# Patient Record
Sex: Female | Born: 1965 | Race: Black or African American | Hispanic: No | Marital: Married | Smoking: Former smoker
Health system: Southern US, Community
[De-identification: ages and names within clinical notes are randomized; demographics above are authoritative.]

## PROBLEM LIST (undated history)

## (undated) DIAGNOSIS — K209 Esophagitis, unspecified: Secondary | ICD-10-CM

## (undated) DIAGNOSIS — I10 Essential (primary) hypertension: Secondary | ICD-10-CM

## (undated) DIAGNOSIS — Z227 Latent tuberculosis: Secondary | ICD-10-CM

## (undated) DIAGNOSIS — I2510 Atherosclerotic heart disease of native coronary artery without angina pectoris: Secondary | ICD-10-CM

## (undated) DIAGNOSIS — E119 Type 2 diabetes mellitus without complications: Secondary | ICD-10-CM

## (undated) DIAGNOSIS — F1910 Other psychoactive substance abuse, uncomplicated: Secondary | ICD-10-CM

## (undated) DIAGNOSIS — D509 Iron deficiency anemia, unspecified: Secondary | ICD-10-CM

## (undated) DIAGNOSIS — Z930 Tracheostomy status: Secondary | ICD-10-CM

## (undated) DIAGNOSIS — Z8639 Personal history of other endocrine, nutritional and metabolic disease: Secondary | ICD-10-CM

## (undated) DIAGNOSIS — E785 Hyperlipidemia, unspecified: Secondary | ICD-10-CM

## (undated) DIAGNOSIS — Z8619 Personal history of other infectious and parasitic diseases: Secondary | ICD-10-CM

## (undated) DIAGNOSIS — G9340 Encephalopathy, unspecified: Secondary | ICD-10-CM

## (undated) DIAGNOSIS — E1065 Type 1 diabetes mellitus with hyperglycemia: Principal | ICD-10-CM

## (undated) DIAGNOSIS — F99 Mental disorder, not otherwise specified: Secondary | ICD-10-CM

## (undated) HISTORY — DX: Encephalopathy, unspecified: G93.40

## (undated) HISTORY — DX: Esophagitis, unspecified: K20.9

## (undated) HISTORY — DX: Latent tuberculosis: Z22.7

## (undated) HISTORY — DX: Type 1 diabetes mellitus with hyperglycemia: E10.65

## (undated) HISTORY — DX: Mental disorder, not otherwise specified: F99

## (undated) HISTORY — DX: Tracheostomy status: Z93.0

## (undated) HISTORY — DX: Personal history of other endocrine, nutritional and metabolic disease: Z86.39

## (undated) HISTORY — DX: Iron deficiency anemia, unspecified: D50.9

## (undated) HISTORY — DX: Other psychoactive substance abuse, uncomplicated: F19.10

## (undated) HISTORY — DX: Hyperlipidemia, unspecified: E78.5

## (undated) HISTORY — PX: TRACHEOSTOMY: SUR1362

## (undated) HISTORY — PX: APPENDECTOMY: SHX54

## (undated) HISTORY — PX: EYE SURGERY: SHX253

## (undated) HISTORY — DX: Personal history of other infectious and parasitic diseases: Z86.19

## (undated) HISTORY — DX: Essential (primary) hypertension: I10

## (undated) HISTORY — DX: Atherosclerotic heart disease of native coronary artery without angina pectoris: I25.10

---
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
## 1998-01-08 ENCOUNTER — Emergency Department (HOSPITAL_COMMUNITY): Admission: EM | Admit: 1998-01-08 | Discharge: 1998-01-08 | Payer: Self-pay | Admitting: *Deleted

## 1998-01-15 ENCOUNTER — Emergency Department (HOSPITAL_COMMUNITY): Admission: EM | Admit: 1998-01-15 | Discharge: 1998-01-15 | Payer: Self-pay | Admitting: Emergency Medicine

## 1998-02-11 ENCOUNTER — Emergency Department (HOSPITAL_COMMUNITY): Admission: EM | Admit: 1998-02-11 | Discharge: 1998-02-11 | Payer: Self-pay | Admitting: *Deleted

## 1998-03-24 ENCOUNTER — Emergency Department (HOSPITAL_COMMUNITY): Admission: EM | Admit: 1998-03-24 | Discharge: 1998-03-24 | Payer: Self-pay | Admitting: Emergency Medicine

## 1998-05-27 ENCOUNTER — Emergency Department (HOSPITAL_COMMUNITY): Admission: EM | Admit: 1998-05-27 | Discharge: 1998-05-27 | Payer: Self-pay | Admitting: Emergency Medicine

## 1998-05-28 ENCOUNTER — Emergency Department (HOSPITAL_COMMUNITY): Admission: EM | Admit: 1998-05-28 | Discharge: 1998-05-28 | Payer: Self-pay | Admitting: *Deleted

## 1998-05-29 ENCOUNTER — Emergency Department (HOSPITAL_COMMUNITY): Admission: EM | Admit: 1998-05-29 | Discharge: 1998-05-29 | Payer: Self-pay | Admitting: Emergency Medicine

## 1998-06-04 ENCOUNTER — Emergency Department (HOSPITAL_COMMUNITY): Admission: EM | Admit: 1998-06-04 | Discharge: 1998-06-04 | Payer: Self-pay | Admitting: Emergency Medicine

## 1998-06-12 ENCOUNTER — Emergency Department (HOSPITAL_COMMUNITY): Admission: EM | Admit: 1998-06-12 | Discharge: 1998-06-12 | Payer: Self-pay | Admitting: Emergency Medicine

## 1998-07-31 ENCOUNTER — Emergency Department (HOSPITAL_COMMUNITY): Admission: EM | Admit: 1998-07-31 | Discharge: 1998-07-31 | Payer: Self-pay | Admitting: Emergency Medicine

## 1998-09-21 ENCOUNTER — Encounter: Payer: Self-pay | Admitting: Emergency Medicine

## 1998-09-21 ENCOUNTER — Emergency Department (HOSPITAL_COMMUNITY): Admission: EM | Admit: 1998-09-21 | Discharge: 1998-09-21 | Payer: Self-pay | Admitting: Emergency Medicine

## 1998-12-25 ENCOUNTER — Encounter: Payer: Self-pay | Admitting: Emergency Medicine

## 1998-12-25 ENCOUNTER — Inpatient Hospital Stay (HOSPITAL_COMMUNITY): Admission: EM | Admit: 1998-12-25 | Discharge: 1998-12-28 | Payer: Self-pay | Admitting: Emergency Medicine

## 1999-04-18 ENCOUNTER — Emergency Department (HOSPITAL_COMMUNITY): Admission: EM | Admit: 1999-04-18 | Discharge: 1999-04-18 | Payer: Self-pay | Admitting: Emergency Medicine

## 1999-04-22 ENCOUNTER — Emergency Department (HOSPITAL_COMMUNITY): Admission: EM | Admit: 1999-04-22 | Discharge: 1999-04-22 | Payer: Self-pay | Admitting: Emergency Medicine

## 1999-04-23 ENCOUNTER — Encounter: Payer: Self-pay | Admitting: Emergency Medicine

## 1999-06-09 ENCOUNTER — Encounter: Payer: Self-pay | Admitting: Internal Medicine

## 1999-06-09 ENCOUNTER — Inpatient Hospital Stay (HOSPITAL_COMMUNITY): Admission: EM | Admit: 1999-06-09 | Discharge: 1999-06-11 | Payer: Self-pay | Admitting: Emergency Medicine

## 1999-06-11 ENCOUNTER — Encounter: Payer: Self-pay | Admitting: Internal Medicine

## 1999-09-06 ENCOUNTER — Encounter: Payer: Self-pay | Admitting: Emergency Medicine

## 1999-09-06 ENCOUNTER — Emergency Department (HOSPITAL_COMMUNITY): Admission: EM | Admit: 1999-09-06 | Discharge: 1999-09-06 | Payer: Self-pay | Admitting: Emergency Medicine

## 1999-09-13 ENCOUNTER — Emergency Department (HOSPITAL_COMMUNITY): Admission: EM | Admit: 1999-09-13 | Discharge: 1999-09-13 | Payer: Self-pay | Admitting: Emergency Medicine

## 1999-10-26 ENCOUNTER — Emergency Department (HOSPITAL_COMMUNITY): Admission: EM | Admit: 1999-10-26 | Discharge: 1999-10-26 | Payer: Self-pay | Admitting: Emergency Medicine

## 2000-09-23 ENCOUNTER — Emergency Department (HOSPITAL_COMMUNITY): Admission: EM | Admit: 2000-09-23 | Discharge: 2000-09-23 | Payer: Self-pay | Admitting: Emergency Medicine

## 2000-09-26 ENCOUNTER — Encounter: Payer: Self-pay | Admitting: Emergency Medicine

## 2000-09-27 ENCOUNTER — Inpatient Hospital Stay (HOSPITAL_COMMUNITY): Admission: EM | Admit: 2000-09-27 | Discharge: 2000-09-28 | Payer: Self-pay | Admitting: Emergency Medicine

## 2000-12-24 ENCOUNTER — Encounter: Payer: Self-pay | Admitting: *Deleted

## 2000-12-24 ENCOUNTER — Inpatient Hospital Stay (HOSPITAL_COMMUNITY): Admission: EM | Admit: 2000-12-24 | Discharge: 2000-12-27 | Payer: Self-pay | Admitting: Emergency Medicine

## 2000-12-24 ENCOUNTER — Encounter: Payer: Self-pay | Admitting: Internal Medicine

## 2001-01-12 ENCOUNTER — Encounter: Payer: Self-pay | Admitting: Emergency Medicine

## 2001-01-12 ENCOUNTER — Emergency Department (HOSPITAL_COMMUNITY): Admission: EM | Admit: 2001-01-12 | Discharge: 2001-01-12 | Payer: Self-pay | Admitting: Emergency Medicine

## 2001-01-13 ENCOUNTER — Emergency Department (HOSPITAL_COMMUNITY): Admission: EM | Admit: 2001-01-13 | Discharge: 2001-01-13 | Payer: Self-pay | Admitting: Emergency Medicine

## 2001-02-08 ENCOUNTER — Encounter: Payer: Self-pay | Admitting: Emergency Medicine

## 2001-02-08 ENCOUNTER — Emergency Department (HOSPITAL_COMMUNITY): Admission: EM | Admit: 2001-02-08 | Discharge: 2001-02-08 | Payer: Self-pay | Admitting: Emergency Medicine

## 2001-11-22 ENCOUNTER — Emergency Department (HOSPITAL_COMMUNITY): Admission: EM | Admit: 2001-11-22 | Discharge: 2001-11-23 | Payer: Self-pay | Admitting: Emergency Medicine

## 2001-12-18 ENCOUNTER — Inpatient Hospital Stay (HOSPITAL_COMMUNITY): Admission: EM | Admit: 2001-12-18 | Discharge: 2001-12-20 | Payer: Self-pay | Admitting: Emergency Medicine

## 2001-12-18 ENCOUNTER — Encounter: Payer: Self-pay | Admitting: Internal Medicine

## 2002-01-04 ENCOUNTER — Inpatient Hospital Stay (HOSPITAL_COMMUNITY): Admission: EM | Admit: 2002-01-04 | Discharge: 2002-01-11 | Payer: Self-pay | Admitting: Psychiatry

## 2002-01-17 ENCOUNTER — Inpatient Hospital Stay (HOSPITAL_COMMUNITY): Admission: EM | Admit: 2002-01-17 | Discharge: 2002-01-20 | Payer: Self-pay | Admitting: Emergency Medicine

## 2002-01-20 ENCOUNTER — Encounter: Payer: Self-pay | Admitting: Family Medicine

## 2002-02-07 ENCOUNTER — Inpatient Hospital Stay (HOSPITAL_COMMUNITY): Admission: EM | Admit: 2002-02-07 | Discharge: 2002-02-11 | Payer: Self-pay | Admitting: *Deleted

## 2002-02-08 ENCOUNTER — Encounter: Payer: Self-pay | Admitting: Internal Medicine

## 2002-03-08 ENCOUNTER — Emergency Department (HOSPITAL_COMMUNITY): Admission: EM | Admit: 2002-03-08 | Discharge: 2002-03-08 | Payer: Self-pay | Admitting: Emergency Medicine

## 2002-04-10 ENCOUNTER — Encounter: Payer: Self-pay | Admitting: Emergency Medicine

## 2002-04-10 ENCOUNTER — Inpatient Hospital Stay (HOSPITAL_COMMUNITY): Admission: EM | Admit: 2002-04-10 | Discharge: 2002-04-13 | Payer: Self-pay | Admitting: Emergency Medicine

## 2002-04-11 ENCOUNTER — Encounter: Payer: Self-pay | Admitting: Internal Medicine

## 2002-05-18 ENCOUNTER — Emergency Department (HOSPITAL_COMMUNITY): Admission: EM | Admit: 2002-05-18 | Discharge: 2002-05-18 | Payer: Self-pay | Admitting: Emergency Medicine

## 2002-06-24 ENCOUNTER — Encounter: Payer: Self-pay | Admitting: Emergency Medicine

## 2002-06-25 ENCOUNTER — Inpatient Hospital Stay (HOSPITAL_COMMUNITY): Admission: EM | Admit: 2002-06-25 | Discharge: 2002-06-30 | Payer: Self-pay | Admitting: Emergency Medicine

## 2002-06-30 ENCOUNTER — Inpatient Hospital Stay (HOSPITAL_COMMUNITY): Admission: EM | Admit: 2002-06-30 | Discharge: 2002-07-03 | Payer: Self-pay | Admitting: Psychiatry

## 2002-07-05 ENCOUNTER — Inpatient Hospital Stay (HOSPITAL_COMMUNITY): Admission: EM | Admit: 2002-07-05 | Discharge: 2002-07-08 | Payer: Self-pay | Admitting: Emergency Medicine

## 2002-08-31 ENCOUNTER — Inpatient Hospital Stay (HOSPITAL_COMMUNITY): Admission: EM | Admit: 2002-08-31 | Discharge: 2002-09-05 | Payer: Self-pay | Admitting: Emergency Medicine

## 2002-08-31 ENCOUNTER — Encounter: Payer: Self-pay | Admitting: Emergency Medicine

## 2002-08-31 ENCOUNTER — Encounter: Payer: Self-pay | Admitting: Internal Medicine

## 2002-09-01 ENCOUNTER — Encounter: Payer: Self-pay | Admitting: Internal Medicine

## 2002-09-15 ENCOUNTER — Emergency Department (HOSPITAL_COMMUNITY): Admission: EM | Admit: 2002-09-15 | Discharge: 2002-09-15 | Payer: Self-pay | Admitting: Emergency Medicine

## 2002-09-16 ENCOUNTER — Emergency Department (HOSPITAL_COMMUNITY): Admission: EM | Admit: 2002-09-16 | Discharge: 2002-09-16 | Payer: Self-pay

## 2002-09-27 ENCOUNTER — Emergency Department (HOSPITAL_COMMUNITY): Admission: EM | Admit: 2002-09-27 | Discharge: 2002-09-27 | Payer: Self-pay | Admitting: Emergency Medicine

## 2002-10-02 ENCOUNTER — Emergency Department (HOSPITAL_COMMUNITY): Admission: EM | Admit: 2002-10-02 | Discharge: 2002-10-02 | Payer: Self-pay | Admitting: Emergency Medicine

## 2002-10-03 ENCOUNTER — Emergency Department (HOSPITAL_COMMUNITY): Admission: EM | Admit: 2002-10-03 | Discharge: 2002-10-03 | Payer: Self-pay | Admitting: Emergency Medicine

## 2002-10-15 ENCOUNTER — Inpatient Hospital Stay (HOSPITAL_COMMUNITY): Admission: EM | Admit: 2002-10-15 | Discharge: 2002-10-16 | Payer: Self-pay | Admitting: Emergency Medicine

## 2002-11-17 ENCOUNTER — Emergency Department (HOSPITAL_COMMUNITY): Admission: EM | Admit: 2002-11-17 | Discharge: 2002-11-17 | Payer: Self-pay | Admitting: Emergency Medicine

## 2002-12-12 ENCOUNTER — Inpatient Hospital Stay (HOSPITAL_COMMUNITY): Admission: EM | Admit: 2002-12-12 | Discharge: 2002-12-16 | Payer: Self-pay | Admitting: Emergency Medicine

## 2002-12-30 ENCOUNTER — Emergency Department (HOSPITAL_COMMUNITY): Admission: EM | Admit: 2002-12-30 | Discharge: 2002-12-31 | Payer: Self-pay | Admitting: Emergency Medicine

## 2003-01-09 ENCOUNTER — Emergency Department (HOSPITAL_COMMUNITY): Admission: EM | Admit: 2003-01-09 | Discharge: 2003-01-09 | Payer: Self-pay | Admitting: *Deleted

## 2003-01-17 ENCOUNTER — Emergency Department (HOSPITAL_COMMUNITY): Admission: EM | Admit: 2003-01-17 | Discharge: 2003-01-18 | Payer: Self-pay | Admitting: Emergency Medicine

## 2003-01-19 ENCOUNTER — Inpatient Hospital Stay (HOSPITAL_COMMUNITY): Admission: EM | Admit: 2003-01-19 | Discharge: 2003-01-26 | Payer: Self-pay | Admitting: Psychiatry

## 2003-02-07 ENCOUNTER — Emergency Department (HOSPITAL_COMMUNITY): Admission: EM | Admit: 2003-02-07 | Discharge: 2003-02-07 | Payer: Self-pay | Admitting: Emergency Medicine

## 2003-02-15 ENCOUNTER — Emergency Department (HOSPITAL_COMMUNITY): Admission: EM | Admit: 2003-02-15 | Discharge: 2003-02-15 | Payer: Self-pay

## 2003-03-07 ENCOUNTER — Inpatient Hospital Stay (HOSPITAL_COMMUNITY): Admission: EM | Admit: 2003-03-07 | Discharge: 2003-03-17 | Payer: Self-pay | Admitting: Emergency Medicine

## 2003-03-07 ENCOUNTER — Encounter: Payer: Self-pay | Admitting: Otolaryngology

## 2003-04-02 ENCOUNTER — Inpatient Hospital Stay (HOSPITAL_COMMUNITY): Admission: EM | Admit: 2003-04-02 | Discharge: 2003-04-13 | Payer: Self-pay | Admitting: Emergency Medicine

## 2003-04-03 ENCOUNTER — Encounter: Payer: Self-pay | Admitting: Internal Medicine

## 2003-04-04 ENCOUNTER — Encounter (INDEPENDENT_AMBULATORY_CARE_PROVIDER_SITE_OTHER): Payer: Self-pay

## 2003-04-17 ENCOUNTER — Emergency Department (HOSPITAL_COMMUNITY): Admission: EM | Admit: 2003-04-17 | Discharge: 2003-04-17 | Payer: Self-pay | Admitting: *Deleted

## 2003-04-25 ENCOUNTER — Emergency Department (HOSPITAL_COMMUNITY): Admission: EM | Admit: 2003-04-25 | Discharge: 2003-04-25 | Payer: Self-pay | Admitting: Emergency Medicine

## 2003-07-06 ENCOUNTER — Emergency Department (HOSPITAL_COMMUNITY): Admission: AD | Admit: 2003-07-06 | Discharge: 2003-07-06 | Payer: Self-pay | Admitting: Family Medicine

## 2003-07-07 ENCOUNTER — Emergency Department (HOSPITAL_COMMUNITY): Admission: AD | Admit: 2003-07-07 | Discharge: 2003-07-07 | Payer: Self-pay | Admitting: Family Medicine

## 2003-07-27 ENCOUNTER — Inpatient Hospital Stay (HOSPITAL_COMMUNITY): Admission: EM | Admit: 2003-07-27 | Discharge: 2003-07-30 | Payer: Self-pay | Admitting: Emergency Medicine

## 2003-08-30 ENCOUNTER — Inpatient Hospital Stay (HOSPITAL_COMMUNITY): Admission: EM | Admit: 2003-08-30 | Discharge: 2003-09-02 | Payer: Self-pay | Admitting: Emergency Medicine

## 2003-11-02 ENCOUNTER — Emergency Department (HOSPITAL_COMMUNITY): Admission: EM | Admit: 2003-11-02 | Discharge: 2003-11-02 | Payer: Self-pay | Admitting: Emergency Medicine

## 2003-11-24 ENCOUNTER — Emergency Department (HOSPITAL_COMMUNITY): Admission: EM | Admit: 2003-11-24 | Discharge: 2003-11-24 | Payer: Self-pay | Admitting: Family Medicine

## 2003-12-28 ENCOUNTER — Emergency Department (HOSPITAL_COMMUNITY): Admission: EM | Admit: 2003-12-28 | Discharge: 2003-12-28 | Payer: Self-pay | Admitting: Family Medicine

## 2004-02-10 ENCOUNTER — Emergency Department (HOSPITAL_COMMUNITY): Admission: EM | Admit: 2004-02-10 | Discharge: 2004-02-10 | Payer: Self-pay | Admitting: Emergency Medicine

## 2004-02-12 ENCOUNTER — Emergency Department (HOSPITAL_COMMUNITY): Admission: EM | Admit: 2004-02-12 | Discharge: 2004-02-12 | Payer: Self-pay | Admitting: Family Medicine

## 2004-03-10 ENCOUNTER — Emergency Department (HOSPITAL_COMMUNITY): Admission: EM | Admit: 2004-03-10 | Discharge: 2004-03-10 | Payer: Self-pay | Admitting: Emergency Medicine

## 2004-04-14 ENCOUNTER — Emergency Department (HOSPITAL_COMMUNITY): Admission: EM | Admit: 2004-04-14 | Discharge: 2004-04-14 | Payer: Self-pay | Admitting: Emergency Medicine

## 2004-05-18 ENCOUNTER — Ambulatory Visit: Payer: Self-pay | Admitting: Internal Medicine

## 2004-07-27 ENCOUNTER — Emergency Department (HOSPITAL_COMMUNITY): Admission: EM | Admit: 2004-07-27 | Discharge: 2004-07-27 | Payer: Self-pay | Admitting: Family Medicine

## 2004-08-06 ENCOUNTER — Ambulatory Visit: Payer: Self-pay | Admitting: Internal Medicine

## 2004-08-06 ENCOUNTER — Ambulatory Visit: Payer: Self-pay | Admitting: Cardiology

## 2004-08-06 ENCOUNTER — Inpatient Hospital Stay (HOSPITAL_COMMUNITY): Admission: EM | Admit: 2004-08-06 | Discharge: 2004-08-14 | Payer: Self-pay | Admitting: Emergency Medicine

## 2004-08-10 ENCOUNTER — Encounter: Payer: Self-pay | Admitting: Cardiology

## 2004-08-29 ENCOUNTER — Emergency Department (HOSPITAL_COMMUNITY): Admission: EM | Admit: 2004-08-29 | Discharge: 2004-08-29 | Payer: Self-pay | Admitting: Emergency Medicine

## 2004-09-05 ENCOUNTER — Emergency Department (HOSPITAL_COMMUNITY): Admission: EM | Admit: 2004-09-05 | Discharge: 2004-09-05 | Payer: Self-pay | Admitting: Family Medicine

## 2004-09-11 ENCOUNTER — Emergency Department (HOSPITAL_COMMUNITY): Admission: EM | Admit: 2004-09-11 | Discharge: 2004-09-11 | Payer: Self-pay | Admitting: Emergency Medicine

## 2004-09-13 ENCOUNTER — Emergency Department (HOSPITAL_COMMUNITY): Admission: EM | Admit: 2004-09-13 | Discharge: 2004-09-13 | Payer: Self-pay | Admitting: Emergency Medicine

## 2004-09-20 ENCOUNTER — Emergency Department (HOSPITAL_COMMUNITY): Admission: EM | Admit: 2004-09-20 | Discharge: 2004-09-21 | Payer: Self-pay | Admitting: *Deleted

## 2004-10-03 ENCOUNTER — Emergency Department (HOSPITAL_COMMUNITY): Admission: EM | Admit: 2004-10-03 | Discharge: 2004-10-03 | Payer: Self-pay | Admitting: Emergency Medicine

## 2004-10-09 ENCOUNTER — Inpatient Hospital Stay (HOSPITAL_COMMUNITY): Admission: EM | Admit: 2004-10-09 | Discharge: 2004-10-14 | Payer: Self-pay | Admitting: Emergency Medicine

## 2004-10-15 ENCOUNTER — Inpatient Hospital Stay (HOSPITAL_COMMUNITY): Admission: EM | Admit: 2004-10-15 | Discharge: 2004-10-17 | Payer: Self-pay | Admitting: Emergency Medicine

## 2005-01-08 ENCOUNTER — Emergency Department (HOSPITAL_COMMUNITY): Admission: EM | Admit: 2005-01-08 | Discharge: 2005-01-08 | Payer: Self-pay | Admitting: Emergency Medicine

## 2005-01-11 ENCOUNTER — Emergency Department (HOSPITAL_COMMUNITY): Admission: EM | Admit: 2005-01-11 | Discharge: 2005-01-11 | Payer: Self-pay | Admitting: Emergency Medicine

## 2005-01-16 ENCOUNTER — Emergency Department (HOSPITAL_COMMUNITY): Admission: EM | Admit: 2005-01-16 | Discharge: 2005-01-16 | Payer: Self-pay | Admitting: Emergency Medicine

## 2005-03-08 ENCOUNTER — Emergency Department (HOSPITAL_COMMUNITY): Admission: EM | Admit: 2005-03-08 | Discharge: 2005-03-08 | Payer: Self-pay | Admitting: Emergency Medicine

## 2005-05-29 ENCOUNTER — Inpatient Hospital Stay (HOSPITAL_COMMUNITY): Admission: EM | Admit: 2005-05-29 | Discharge: 2005-05-31 | Payer: Self-pay | Admitting: Emergency Medicine

## 2005-10-05 ENCOUNTER — Emergency Department (HOSPITAL_COMMUNITY): Admission: EM | Admit: 2005-10-05 | Discharge: 2005-10-05 | Payer: Self-pay | Admitting: Family Medicine

## 2005-10-19 ENCOUNTER — Ambulatory Visit: Payer: Self-pay | Admitting: Hospitalist

## 2005-11-02 ENCOUNTER — Ambulatory Visit: Payer: Self-pay | Admitting: Hospitalist

## 2005-12-20 ENCOUNTER — Emergency Department (HOSPITAL_COMMUNITY): Admission: EM | Admit: 2005-12-20 | Discharge: 2005-12-20 | Payer: Self-pay | Admitting: Emergency Medicine

## 2005-12-28 ENCOUNTER — Inpatient Hospital Stay (HOSPITAL_COMMUNITY): Admission: RE | Admit: 2005-12-28 | Discharge: 2006-01-05 | Payer: Self-pay | Admitting: Psychiatry

## 2005-12-28 ENCOUNTER — Encounter: Admission: RE | Admit: 2005-12-28 | Discharge: 2005-12-28 | Payer: Self-pay | Admitting: Internal Medicine

## 2005-12-28 ENCOUNTER — Ambulatory Visit: Payer: Self-pay | Admitting: Internal Medicine

## 2005-12-29 ENCOUNTER — Ambulatory Visit: Payer: Self-pay | Admitting: Psychiatry

## 2006-01-08 ENCOUNTER — Emergency Department (HOSPITAL_COMMUNITY): Admission: EM | Admit: 2006-01-08 | Discharge: 2006-01-08 | Payer: Self-pay | Admitting: Emergency Medicine

## 2006-02-09 ENCOUNTER — Emergency Department (HOSPITAL_COMMUNITY): Admission: EM | Admit: 2006-02-09 | Discharge: 2006-02-09 | Payer: Self-pay | Admitting: Emergency Medicine

## 2006-02-22 ENCOUNTER — Emergency Department (HOSPITAL_COMMUNITY): Admission: EM | Admit: 2006-02-22 | Discharge: 2006-02-22 | Payer: Self-pay | Admitting: Family Medicine

## 2006-02-25 ENCOUNTER — Emergency Department (HOSPITAL_COMMUNITY): Admission: EM | Admit: 2006-02-25 | Discharge: 2006-02-25 | Payer: Self-pay | Admitting: Emergency Medicine

## 2006-03-02 ENCOUNTER — Emergency Department (HOSPITAL_COMMUNITY): Admission: EM | Admit: 2006-03-02 | Discharge: 2006-03-02 | Payer: Self-pay | Admitting: Emergency Medicine

## 2006-04-03 ENCOUNTER — Emergency Department (HOSPITAL_COMMUNITY): Admission: EM | Admit: 2006-04-03 | Discharge: 2006-04-03 | Payer: Self-pay | Admitting: Emergency Medicine

## 2006-04-23 ENCOUNTER — Emergency Department (HOSPITAL_COMMUNITY): Admission: EM | Admit: 2006-04-23 | Discharge: 2006-04-23 | Payer: Self-pay | Admitting: Emergency Medicine

## 2006-04-26 ENCOUNTER — Emergency Department (HOSPITAL_COMMUNITY): Admission: EM | Admit: 2006-04-26 | Discharge: 2006-04-26 | Payer: Self-pay | Admitting: Emergency Medicine

## 2006-04-27 ENCOUNTER — Inpatient Hospital Stay (HOSPITAL_COMMUNITY): Admission: EM | Admit: 2006-04-27 | Discharge: 2006-04-29 | Payer: Self-pay | Admitting: Emergency Medicine

## 2006-06-22 DIAGNOSIS — E1065 Type 1 diabetes mellitus with hyperglycemia: Secondary | ICD-10-CM

## 2006-06-22 DIAGNOSIS — L0390 Cellulitis, unspecified: Secondary | ICD-10-CM

## 2006-06-22 DIAGNOSIS — D509 Iron deficiency anemia, unspecified: Secondary | ICD-10-CM

## 2006-06-22 DIAGNOSIS — L708 Other acne: Secondary | ICD-10-CM

## 2006-06-22 DIAGNOSIS — L0291 Cutaneous abscess, unspecified: Secondary | ICD-10-CM

## 2006-06-22 DIAGNOSIS — I10 Essential (primary) hypertension: Secondary | ICD-10-CM | POA: Insufficient documentation

## 2006-06-22 DIAGNOSIS — F1911 Other psychoactive substance abuse, in remission: Secondary | ICD-10-CM

## 2006-06-22 DIAGNOSIS — IMO0002 Reserved for concepts with insufficient information to code with codable children: Secondary | ICD-10-CM

## 2006-06-22 HISTORY — DX: Type 1 diabetes mellitus with hyperglycemia: E10.65

## 2006-06-22 HISTORY — DX: Reserved for concepts with insufficient information to code with codable children: IMO0002

## 2006-08-05 DIAGNOSIS — Z8719 Personal history of other diseases of the digestive system: Secondary | ICD-10-CM | POA: Insufficient documentation

## 2006-08-05 DIAGNOSIS — I2510 Atherosclerotic heart disease of native coronary artery without angina pectoris: Secondary | ICD-10-CM | POA: Insufficient documentation

## 2006-08-05 DIAGNOSIS — F329 Major depressive disorder, single episode, unspecified: Secondary | ICD-10-CM

## 2006-08-31 ENCOUNTER — Encounter (INDEPENDENT_AMBULATORY_CARE_PROVIDER_SITE_OTHER): Payer: Self-pay | Admitting: Hospitalist

## 2006-09-05 ENCOUNTER — Telehealth: Payer: Self-pay | Admitting: *Deleted

## 2006-09-08 ENCOUNTER — Inpatient Hospital Stay (HOSPITAL_COMMUNITY): Admission: EM | Admit: 2006-09-08 | Discharge: 2006-09-19 | Payer: Self-pay | Admitting: Emergency Medicine

## 2006-09-08 ENCOUNTER — Ambulatory Visit: Payer: Self-pay | Admitting: Internal Medicine

## 2006-09-20 ENCOUNTER — Encounter (INDEPENDENT_AMBULATORY_CARE_PROVIDER_SITE_OTHER): Payer: Self-pay | Admitting: Internal Medicine

## 2006-09-28 ENCOUNTER — Telehealth (INDEPENDENT_AMBULATORY_CARE_PROVIDER_SITE_OTHER): Payer: Self-pay | Admitting: Pharmacy Technician

## 2006-11-24 ENCOUNTER — Inpatient Hospital Stay (HOSPITAL_COMMUNITY): Admission: EM | Admit: 2006-11-24 | Discharge: 2006-11-27 | Payer: Self-pay | Admitting: Emergency Medicine

## 2006-12-05 ENCOUNTER — Emergency Department (HOSPITAL_COMMUNITY): Admission: EM | Admit: 2006-12-05 | Discharge: 2006-12-05 | Payer: Self-pay | Admitting: Emergency Medicine

## 2006-12-11 ENCOUNTER — Emergency Department (HOSPITAL_COMMUNITY): Admission: EM | Admit: 2006-12-11 | Discharge: 2006-12-12 | Payer: Self-pay | Admitting: Emergency Medicine

## 2007-02-05 ENCOUNTER — Emergency Department (HOSPITAL_COMMUNITY): Admission: EM | Admit: 2007-02-05 | Discharge: 2007-02-05 | Payer: Self-pay | Admitting: Emergency Medicine

## 2007-02-08 ENCOUNTER — Encounter (INDEPENDENT_AMBULATORY_CARE_PROVIDER_SITE_OTHER): Payer: Self-pay | Admitting: Internal Medicine

## 2007-02-08 ENCOUNTER — Ambulatory Visit: Payer: Self-pay | Admitting: Internal Medicine

## 2007-02-08 ENCOUNTER — Inpatient Hospital Stay (HOSPITAL_COMMUNITY): Admission: AD | Admit: 2007-02-08 | Discharge: 2007-02-12 | Payer: Self-pay | Admitting: Internal Medicine

## 2007-02-08 DIAGNOSIS — L0292 Furuncle, unspecified: Secondary | ICD-10-CM | POA: Insufficient documentation

## 2007-02-08 DIAGNOSIS — L0293 Carbuncle, unspecified: Secondary | ICD-10-CM

## 2007-02-08 LAB — CONVERTED CEMR LAB
BUN: 11 mg/dL (ref 6–23)
Band Neutrophils: 0 % (ref 0–10)
Basophils Relative: 0 % (ref 0–1)
Blood Glucose, Fingerstick: 426
CO2: 26 meq/L (ref 19–32)
Creatinine, Ser: 0.84 mg/dL (ref 0.40–1.20)
Glucose, Bld: 511 mg/dL (ref 70–99)
Hemoglobin: 11.7 g/dL — ABNORMAL LOW (ref 12.0–15.0)
Hgb A1c MFr Bld: 9.5 %
Lymphocytes Relative: 51 % — ABNORMAL HIGH (ref 12–46)
Neutrophils Relative %: 45 % (ref 43–77)
Platelets: 231 10*3/uL (ref 150–400)
RDW: 15.8 % — ABNORMAL HIGH (ref 11.5–14.0)
Total Bilirubin: 0.4 mg/dL (ref 0.3–1.2)

## 2007-02-12 ENCOUNTER — Ambulatory Visit: Payer: Self-pay | Admitting: Psychiatry

## 2007-03-06 ENCOUNTER — Ambulatory Visit: Payer: Self-pay | Admitting: Hospitalist

## 2007-03-19 ENCOUNTER — Telehealth (INDEPENDENT_AMBULATORY_CARE_PROVIDER_SITE_OTHER): Payer: Self-pay | Admitting: *Deleted

## 2007-04-21 ENCOUNTER — Emergency Department (HOSPITAL_COMMUNITY): Admission: EM | Admit: 2007-04-21 | Discharge: 2007-04-21 | Payer: Self-pay | Admitting: Emergency Medicine

## 2007-06-07 ENCOUNTER — Emergency Department (HOSPITAL_COMMUNITY): Admission: EM | Admit: 2007-06-07 | Discharge: 2007-06-07 | Payer: Self-pay | Admitting: Family Medicine

## 2007-07-22 ENCOUNTER — Emergency Department (HOSPITAL_COMMUNITY): Admission: EM | Admit: 2007-07-22 | Discharge: 2007-07-22 | Payer: Self-pay | Admitting: Family Medicine

## 2007-08-14 ENCOUNTER — Ambulatory Visit: Payer: Self-pay | Admitting: Hospitalist

## 2007-08-14 ENCOUNTER — Telehealth (INDEPENDENT_AMBULATORY_CARE_PROVIDER_SITE_OTHER): Payer: Self-pay | Admitting: Hospitalist

## 2007-08-14 LAB — CONVERTED CEMR LAB
Blood Glucose, Fingerstick: 464
Hgb A1c MFr Bld: 11.1 %

## 2007-08-15 LAB — CONVERTED CEMR LAB: Blood Glucose, Home Monitor: 2 mg/dL

## 2007-08-16 ENCOUNTER — Telehealth: Payer: Self-pay | Admitting: Licensed Clinical Social Worker

## 2007-08-20 ENCOUNTER — Telehealth (INDEPENDENT_AMBULATORY_CARE_PROVIDER_SITE_OTHER): Payer: Self-pay | Admitting: *Deleted

## 2007-08-26 ENCOUNTER — Telehealth: Payer: Self-pay | Admitting: Licensed Clinical Social Worker

## 2008-01-08 ENCOUNTER — Encounter (INDEPENDENT_AMBULATORY_CARE_PROVIDER_SITE_OTHER): Payer: Self-pay | Admitting: Hospitalist

## 2008-01-08 LAB — HM DIABETES EYE EXAM

## 2008-01-14 ENCOUNTER — Encounter (INDEPENDENT_AMBULATORY_CARE_PROVIDER_SITE_OTHER): Payer: Self-pay | Admitting: Hospitalist

## 2008-01-28 ENCOUNTER — Ambulatory Visit: Payer: Self-pay | Admitting: *Deleted

## 2008-01-28 DIAGNOSIS — E785 Hyperlipidemia, unspecified: Secondary | ICD-10-CM

## 2008-01-28 DIAGNOSIS — E1069 Type 1 diabetes mellitus with other specified complication: Secondary | ICD-10-CM

## 2008-01-28 LAB — CONVERTED CEMR LAB: Hgb A1c MFr Bld: 9.8 %

## 2008-02-05 ENCOUNTER — Ambulatory Visit: Payer: Self-pay | Admitting: Internal Medicine

## 2008-02-06 ENCOUNTER — Telehealth: Payer: Self-pay | Admitting: Licensed Clinical Social Worker

## 2008-02-06 ENCOUNTER — Encounter: Payer: Self-pay | Admitting: Licensed Clinical Social Worker

## 2008-03-24 ENCOUNTER — Telehealth (INDEPENDENT_AMBULATORY_CARE_PROVIDER_SITE_OTHER): Payer: Self-pay | Admitting: *Deleted

## 2008-03-27 ENCOUNTER — Ambulatory Visit: Payer: Self-pay | Admitting: Internal Medicine

## 2008-03-27 ENCOUNTER — Encounter (INDEPENDENT_AMBULATORY_CARE_PROVIDER_SITE_OTHER): Payer: Self-pay | Admitting: *Deleted

## 2008-03-27 ENCOUNTER — Encounter (INDEPENDENT_AMBULATORY_CARE_PROVIDER_SITE_OTHER): Payer: Self-pay | Admitting: Internal Medicine

## 2008-03-27 ENCOUNTER — Observation Stay (HOSPITAL_COMMUNITY): Admission: AD | Admit: 2008-03-27 | Discharge: 2008-03-28 | Payer: Self-pay | Admitting: Internal Medicine

## 2008-03-28 ENCOUNTER — Encounter: Payer: Self-pay | Admitting: Internal Medicine

## 2008-04-01 ENCOUNTER — Telehealth (INDEPENDENT_AMBULATORY_CARE_PROVIDER_SITE_OTHER): Payer: Self-pay | Admitting: *Deleted

## 2008-04-14 ENCOUNTER — Emergency Department (HOSPITAL_COMMUNITY): Admission: EM | Admit: 2008-04-14 | Discharge: 2008-04-14 | Payer: Self-pay | Admitting: Family Medicine

## 2008-05-22 ENCOUNTER — Ambulatory Visit: Payer: Self-pay | Admitting: Infectious Diseases

## 2008-05-22 ENCOUNTER — Encounter (INDEPENDENT_AMBULATORY_CARE_PROVIDER_SITE_OTHER): Payer: Self-pay | Admitting: Internal Medicine

## 2008-06-01 ENCOUNTER — Ambulatory Visit: Payer: Self-pay | Admitting: *Deleted

## 2008-06-01 LAB — CONVERTED CEMR LAB: Insulin/Carbohydrate Ratio: 1

## 2008-06-03 ENCOUNTER — Ambulatory Visit: Payer: Self-pay | Admitting: Internal Medicine

## 2008-06-03 ENCOUNTER — Inpatient Hospital Stay (HOSPITAL_COMMUNITY): Admission: EM | Admit: 2008-06-03 | Discharge: 2008-06-09 | Payer: Self-pay | Admitting: Emergency Medicine

## 2008-06-09 ENCOUNTER — Ambulatory Visit: Payer: Self-pay | Admitting: Psychiatry

## 2008-06-09 ENCOUNTER — Inpatient Hospital Stay (HOSPITAL_COMMUNITY): Admission: AD | Admit: 2008-06-09 | Discharge: 2008-06-17 | Payer: Self-pay | Admitting: Psychiatry

## 2008-06-26 ENCOUNTER — Ambulatory Visit: Payer: Self-pay | Admitting: Internal Medicine

## 2008-06-26 LAB — CONVERTED CEMR LAB
Blood Glucose, Fingerstick: 63
Blood Glucose, Home Monitor: 4 mg/dL

## 2008-07-05 ENCOUNTER — Telehealth (INDEPENDENT_AMBULATORY_CARE_PROVIDER_SITE_OTHER): Payer: Self-pay | Admitting: Internal Medicine

## 2008-07-28 ENCOUNTER — Ambulatory Visit: Payer: Self-pay | Admitting: Internal Medicine

## 2008-07-28 ENCOUNTER — Observation Stay (HOSPITAL_COMMUNITY): Admission: EM | Admit: 2008-07-28 | Discharge: 2008-07-29 | Payer: Self-pay | Admitting: Emergency Medicine

## 2008-07-29 ENCOUNTER — Telehealth: Payer: Self-pay | Admitting: *Deleted

## 2008-07-30 ENCOUNTER — Encounter: Payer: Self-pay | Admitting: Internal Medicine

## 2008-08-03 ENCOUNTER — Encounter: Payer: Self-pay | Admitting: *Deleted

## 2008-08-03 ENCOUNTER — Ambulatory Visit: Payer: Self-pay | Admitting: Internal Medicine

## 2008-08-03 LAB — CONVERTED CEMR LAB: LDL Cholesterol: 76 mg/dL

## 2008-08-04 ENCOUNTER — Telehealth (INDEPENDENT_AMBULATORY_CARE_PROVIDER_SITE_OTHER): Payer: Self-pay | Admitting: *Deleted

## 2008-08-06 ENCOUNTER — Telehealth (INDEPENDENT_AMBULATORY_CARE_PROVIDER_SITE_OTHER): Payer: Self-pay | Admitting: *Deleted

## 2008-08-10 ENCOUNTER — Emergency Department (HOSPITAL_COMMUNITY): Admission: EM | Admit: 2008-08-10 | Discharge: 2008-08-10 | Payer: Self-pay | Admitting: Emergency Medicine

## 2008-08-17 ENCOUNTER — Encounter (INDEPENDENT_AMBULATORY_CARE_PROVIDER_SITE_OTHER): Payer: Self-pay | Admitting: Internal Medicine

## 2008-08-18 ENCOUNTER — Encounter (INDEPENDENT_AMBULATORY_CARE_PROVIDER_SITE_OTHER): Payer: Self-pay | Admitting: *Deleted

## 2008-09-07 ENCOUNTER — Encounter (INDEPENDENT_AMBULATORY_CARE_PROVIDER_SITE_OTHER): Payer: Self-pay | Admitting: *Deleted

## 2008-09-14 DIAGNOSIS — G3184 Mild cognitive impairment, so stated: Secondary | ICD-10-CM | POA: Insufficient documentation

## 2008-10-02 ENCOUNTER — Emergency Department (HOSPITAL_COMMUNITY): Admission: EM | Admit: 2008-10-02 | Discharge: 2008-10-02 | Payer: Self-pay | Admitting: Emergency Medicine

## 2008-10-08 ENCOUNTER — Encounter (INDEPENDENT_AMBULATORY_CARE_PROVIDER_SITE_OTHER): Payer: Self-pay | Admitting: *Deleted

## 2008-10-20 ENCOUNTER — Ambulatory Visit: Payer: Self-pay | Admitting: *Deleted

## 2008-10-20 ENCOUNTER — Ambulatory Visit: Payer: Self-pay | Admitting: Internal Medicine

## 2008-10-20 LAB — CONVERTED CEMR LAB: Blood Glucose, Fingerstick: 323

## 2008-10-22 LAB — CONVERTED CEMR LAB
ALT: 8 units/L (ref 0–35)
Albumin: 4.8 g/dL (ref 3.5–5.2)
Alkaline Phosphatase: 58 units/L (ref 39–117)
CO2: 24 meq/L (ref 19–32)
GFR calc non Af Amer: 55 mL/min — ABNORMAL LOW (ref >60)
Glucose, Bld: 282 mg/dL — ABNORMAL HIGH (ref 70–99)
HCT: 41.6 % (ref 36.0–46.0)
HDL: 69 mg/dL (ref >39)
Hemoglobin: 12.6 g/dL (ref 12.0–15.0)
LDL Cholesterol: 80 mg/dL (ref 0–99)
MCV: 64.9 fL — ABNORMAL LOW (ref 78.0–100.0)
Platelets: 270 10*3/uL (ref 150–400)
Potassium: 4.3 meq/L (ref 3.5–5.3)
Sodium: 138 meq/L (ref 135–145)
TSH: 2.94 microintl units/mL (ref 0.350–4.500)
Total CHOL/HDL Ratio: 2.5
Total Protein: 8.5 g/dL — ABNORMAL HIGH (ref 6.0–8.3)
VLDL: 21 mg/dL (ref 0–40)
WBC: 3.8 10*3/uL — ABNORMAL LOW (ref 4.0–10.5)

## 2009-02-09 ENCOUNTER — Telehealth (INDEPENDENT_AMBULATORY_CARE_PROVIDER_SITE_OTHER): Payer: Self-pay | Admitting: *Deleted

## 2009-02-12 ENCOUNTER — Ambulatory Visit: Payer: Self-pay | Admitting: Internal Medicine

## 2009-02-12 LAB — CONVERTED CEMR LAB: Hgb A1c MFr Bld: 9.1 %

## 2009-02-22 ENCOUNTER — Encounter: Payer: Self-pay | Admitting: Internal Medicine

## 2009-02-23 ENCOUNTER — Encounter: Payer: Self-pay | Admitting: Internal Medicine

## 2009-02-23 ENCOUNTER — Telehealth: Payer: Self-pay | Admitting: *Deleted

## 2009-06-15 ENCOUNTER — Telehealth: Payer: Self-pay | Admitting: Internal Medicine

## 2009-06-29 ENCOUNTER — Ambulatory Visit: Payer: Self-pay | Admitting: Infectious Diseases

## 2009-06-29 LAB — CONVERTED CEMR LAB: Blood Glucose, Fingerstick: 487

## 2009-06-30 ENCOUNTER — Telehealth: Payer: Self-pay | Admitting: Licensed Clinical Social Worker

## 2009-07-06 LAB — CONVERTED CEMR LAB
ALT: <8 units/L (ref 0–35)
AST: 8 units/L (ref 0–37)
Albumin: 4.5 g/dL (ref 3.5–5.2)
Alkaline Phosphatase: 52 units/L (ref 39–117)
BUN: 18 mg/dL (ref 6–23)
CO2: 21 meq/L (ref 19–32)
Calcium: 9.2 mg/dL (ref 8.4–10.5)
Chloride: 99 meq/L (ref 96–112)
Cholesterol: 214 mg/dL — ABNORMAL HIGH (ref 0–200)
Creatinine, Ser: 1.02 mg/dL (ref 0.40–1.20)
Glucose, Bld: 397 mg/dL — ABNORMAL HIGH (ref 70–99)
HDL: 76 mg/dL (ref >39)
LDL Cholesterol: 118 mg/dL — ABNORMAL HIGH (ref 0–99)
Potassium: 4.3 meq/L (ref 3.5–5.3)
Sodium: 133 meq/L — ABNORMAL LOW (ref 135–145)
Total Bilirubin: 0.3 mg/dL (ref 0.3–1.2)
Total CHOL/HDL Ratio: 2.8
Total Protein: 8.1 g/dL (ref 6.0–8.3)
Triglycerides: 99 mg/dL (ref <150)
VLDL: 20 mg/dL (ref 0–40)

## 2009-09-04 ENCOUNTER — Emergency Department (HOSPITAL_COMMUNITY): Admission: EM | Admit: 2009-09-04 | Discharge: 2009-09-04 | Payer: Self-pay | Admitting: Emergency Medicine

## 2009-09-05 ENCOUNTER — Telehealth: Payer: Self-pay | Admitting: Internal Medicine

## 2009-10-06 ENCOUNTER — Telehealth: Payer: Self-pay | Admitting: Internal Medicine

## 2009-10-21 ENCOUNTER — Telehealth: Payer: Self-pay | Admitting: Internal Medicine

## 2009-10-21 ENCOUNTER — Ambulatory Visit: Payer: Self-pay | Admitting: Internal Medicine

## 2009-10-21 LAB — CONVERTED CEMR LAB
ALT: 8 units/L (ref 0–35)
Blood Glucose, Fingerstick: 375
CO2: 23 meq/L (ref 19–32)
Calcium: 9.6 mg/dL (ref 8.4–10.5)
Chloride: 101 meq/L (ref 96–112)
Cholesterol: 195 mg/dL (ref 0–200)
Creatinine, Ser: 1.03 mg/dL (ref 0.40–1.20)
Glucose, Bld: 348 mg/dL — ABNORMAL HIGH (ref 70–99)
Total Bilirubin: 0.3 mg/dL (ref 0.3–1.2)
Total CHOL/HDL Ratio: 2.4
Total Protein: 8.5 g/dL — ABNORMAL HIGH (ref 6.0–8.3)
Triglycerides: 65 mg/dL (ref <150)
VLDL: 13 mg/dL (ref 0–40)

## 2009-12-08 ENCOUNTER — Telehealth: Payer: Self-pay | Admitting: Internal Medicine

## 2009-12-16 ENCOUNTER — Telehealth (INDEPENDENT_AMBULATORY_CARE_PROVIDER_SITE_OTHER): Payer: Self-pay | Admitting: *Deleted

## 2010-02-07 ENCOUNTER — Encounter: Payer: Self-pay | Admitting: Internal Medicine

## 2010-02-07 ENCOUNTER — Encounter: Payer: Self-pay | Admitting: Licensed Clinical Social Worker

## 2010-02-07 ENCOUNTER — Telehealth: Payer: Self-pay | Admitting: Licensed Clinical Social Worker

## 2010-02-07 ENCOUNTER — Ambulatory Visit: Payer: Self-pay | Admitting: Internal Medicine

## 2010-02-07 ENCOUNTER — Observation Stay (HOSPITAL_COMMUNITY): Admission: AD | Admit: 2010-02-07 | Discharge: 2010-02-10 | Payer: Self-pay | Admitting: Internal Medicine

## 2010-02-07 DIAGNOSIS — B0239 Other herpes zoster eye disease: Secondary | ICD-10-CM

## 2010-02-07 LAB — CONVERTED CEMR LAB: Blood Glucose, Fingerstick: 237

## 2010-02-08 ENCOUNTER — Telehealth: Payer: Self-pay | Admitting: Licensed Clinical Social Worker

## 2010-02-09 ENCOUNTER — Encounter: Payer: Self-pay | Admitting: Licensed Clinical Social Worker

## 2010-02-10 ENCOUNTER — Encounter: Payer: Self-pay | Admitting: Internal Medicine

## 2010-02-14 ENCOUNTER — Encounter: Payer: Self-pay | Admitting: Internal Medicine

## 2010-02-16 ENCOUNTER — Ambulatory Visit: Payer: Self-pay | Admitting: Internal Medicine

## 2010-02-23 ENCOUNTER — Encounter: Payer: Self-pay | Admitting: Internal Medicine

## 2010-02-23 ENCOUNTER — Ambulatory Visit: Payer: Self-pay | Admitting: Internal Medicine

## 2010-02-23 ENCOUNTER — Telehealth: Payer: Self-pay | Admitting: Internal Medicine

## 2010-03-05 ENCOUNTER — Emergency Department (HOSPITAL_COMMUNITY): Admission: EM | Admit: 2010-03-05 | Discharge: 2010-03-05 | Payer: Self-pay | Admitting: Emergency Medicine

## 2010-03-15 ENCOUNTER — Encounter: Payer: Self-pay | Admitting: Licensed Clinical Social Worker

## 2010-03-15 ENCOUNTER — Emergency Department (HOSPITAL_COMMUNITY): Admission: EM | Admit: 2010-03-15 | Discharge: 2010-03-15 | Payer: Self-pay | Admitting: Emergency Medicine

## 2010-03-15 ENCOUNTER — Ambulatory Visit: Payer: Self-pay | Admitting: Internal Medicine

## 2010-03-15 LAB — CONVERTED CEMR LAB: Blood Glucose, Fingerstick: 174

## 2010-03-16 ENCOUNTER — Emergency Department (HOSPITAL_COMMUNITY): Admission: EM | Admit: 2010-03-16 | Discharge: 2010-03-16 | Payer: Self-pay | Admitting: Emergency Medicine

## 2010-03-17 ENCOUNTER — Emergency Department (HOSPITAL_COMMUNITY): Admission: EM | Admit: 2010-03-17 | Discharge: 2010-03-17 | Payer: Self-pay | Admitting: Emergency Medicine

## 2010-03-20 ENCOUNTER — Encounter: Payer: Self-pay | Admitting: Ophthalmology

## 2010-03-20 ENCOUNTER — Inpatient Hospital Stay (HOSPITAL_COMMUNITY): Admission: EM | Admit: 2010-03-20 | Discharge: 2010-03-30 | Payer: Self-pay | Admitting: Emergency Medicine

## 2010-03-20 ENCOUNTER — Ambulatory Visit: Payer: Self-pay | Admitting: Infectious Diseases

## 2010-03-24 ENCOUNTER — Ambulatory Visit: Payer: Self-pay | Admitting: Psychiatry

## 2010-03-24 ENCOUNTER — Encounter: Payer: Self-pay | Admitting: Internal Medicine

## 2010-03-24 DIAGNOSIS — N3000 Acute cystitis without hematuria: Secondary | ICD-10-CM | POA: Insufficient documentation

## 2010-03-24 DIAGNOSIS — E1069 Type 1 diabetes mellitus with other specified complication: Secondary | ICD-10-CM | POA: Insufficient documentation

## 2010-04-05 ENCOUNTER — Ambulatory Visit: Payer: Self-pay | Admitting: Internal Medicine

## 2010-04-05 DIAGNOSIS — R350 Frequency of micturition: Secondary | ICD-10-CM | POA: Insufficient documentation

## 2010-04-05 DIAGNOSIS — E162 Hypoglycemia, unspecified: Secondary | ICD-10-CM

## 2010-04-06 ENCOUNTER — Telehealth (INDEPENDENT_AMBULATORY_CARE_PROVIDER_SITE_OTHER): Payer: Self-pay | Admitting: *Deleted

## 2010-04-07 LAB — CONVERTED CEMR LAB
AST: 21 units/L (ref 0–37)
Albumin: 4.2 g/dL (ref 3.5–5.2)
Alkaline Phosphatase: 58 units/L (ref 39–117)
Calcium: 9 mg/dL (ref 8.4–10.5)
Chloride: 106 meq/L (ref 96–112)
Potassium: 3.8 meq/L (ref 3.5–5.3)
Sodium: 142 meq/L (ref 135–145)
Total Protein: 7.8 g/dL (ref 6.0–8.3)

## 2010-04-08 ENCOUNTER — Ambulatory Visit: Payer: Self-pay | Admitting: Internal Medicine

## 2010-04-08 LAB — CONVERTED CEMR LAB
Basophils Absolute: 0 10*3/uL (ref 0.0–0.1)
Blood Glucose, Fingerstick: 227
Blood Glucose, Fingerstick: 227
Crystals: NONE SEEN
HCT: 40 % (ref 36.0–46.0)
Hemoglobin: 12.7 g/dL (ref 12.0–15.0)
Lymphocytes Relative: 31 % (ref 12–46)
Lymphs Abs: 1.5 10*3/uL (ref 0.7–4.0)
Monocytes Absolute: 0.6 10*3/uL (ref 0.1–1.0)
Monocytes Relative: 12 % (ref 3–12)
Neutro Abs: 2.7 10*3/uL (ref 1.7–7.7)
Protein, ur: NEGATIVE mg/dL
RBC: 6.15 M/uL — ABNORMAL HIGH (ref 3.87–5.11)
Specific Gravity, Urine: 1.019 (ref 1.005–1.0)
Urine Glucose: >1000 mg/dL — AB
WBC: 4.9 10*3/uL (ref 4.0–10.5)
pH: 6 (ref 5.0–8.0)

## 2010-04-11 ENCOUNTER — Encounter: Payer: Self-pay | Admitting: Internal Medicine

## 2010-04-11 ENCOUNTER — Emergency Department (HOSPITAL_COMMUNITY): Admission: EM | Admit: 2010-04-11 | Discharge: 2010-04-11 | Payer: Self-pay | Admitting: Emergency Medicine

## 2010-04-15 ENCOUNTER — Telehealth: Payer: Self-pay | Admitting: Licensed Clinical Social Worker

## 2010-06-29 ENCOUNTER — Ambulatory Visit: Payer: Self-pay | Admitting: Internal Medicine

## 2010-06-30 ENCOUNTER — Encounter: Payer: Self-pay | Admitting: Internal Medicine

## 2010-07-26 ENCOUNTER — Telehealth: Payer: Self-pay | Admitting: Internal Medicine

## 2010-08-03 ENCOUNTER — Telehealth (INDEPENDENT_AMBULATORY_CARE_PROVIDER_SITE_OTHER): Payer: Self-pay | Admitting: *Deleted

## 2010-08-06 ENCOUNTER — Emergency Department (HOSPITAL_COMMUNITY)
Admission: EM | Admit: 2010-08-06 | Discharge: 2010-08-06 | Payer: Self-pay | Source: Home / Self Care | Admitting: Emergency Medicine

## 2010-08-07 ENCOUNTER — Encounter: Payer: Self-pay | Admitting: Internal Medicine

## 2010-08-08 ENCOUNTER — Encounter: Payer: Self-pay | Admitting: Internal Medicine

## 2010-08-09 LAB — POCT I-STAT, CHEM 8
Creatinine, Ser: 1.1 mg/dL (ref 0.4–1.2)
Hemoglobin: 12.2 g/dL (ref 12.0–15.0)
Potassium: 3.5 mEq/L (ref 3.5–5.1)
Sodium: 139 mEq/L (ref 135–145)

## 2010-08-16 ENCOUNTER — Encounter: Payer: Self-pay | Admitting: Internal Medicine

## 2010-08-16 DIAGNOSIS — D649 Anemia, unspecified: Secondary | ICD-10-CM

## 2010-08-16 DIAGNOSIS — F99 Mental disorder, not otherwise specified: Secondary | ICD-10-CM | POA: Insufficient documentation

## 2010-08-16 DIAGNOSIS — F1910 Other psychoactive substance abuse, uncomplicated: Secondary | ICD-10-CM

## 2010-08-16 DIAGNOSIS — E109 Type 1 diabetes mellitus without complications: Secondary | ICD-10-CM

## 2010-08-16 DIAGNOSIS — E785 Hyperlipidemia, unspecified: Secondary | ICD-10-CM

## 2010-08-16 LAB — CONVERTED CEMR LAB
BUN: 18 mg/dL (ref 6–23)
CO2: 26 meq/L (ref 19–32)
Calcium: 9.1 mg/dL (ref 8.4–10.5)
Chloride: 97 meq/L (ref 96–112)
Creatinine, Ser: 0.93 mg/dL (ref 0.40–1.20)
Creatinine, Urine: 111.9 mg/dL
Glucose, Bld: 173 mg/dL — ABNORMAL HIGH (ref 70–99)
HCT: 36.7 % (ref 36.0–46.0)
Hemoglobin: 11.7 g/dL — ABNORMAL LOW (ref 12.0–15.0)
MCHC: 31.9 g/dL (ref 30.0–36.0)
MCV: 66.1 fL — ABNORMAL LOW (ref 78.0–100.0)
Microalb Creat Ratio: 5.5 mg/g (ref 0.0–30.0)
Microalb, Ur: 0.62 mg/dL (ref 0.00–1.89)
Platelets: 245 10*3/uL (ref 150–400)
Potassium: 4.6 meq/L (ref 3.5–5.3)
RBC: 5.55 M/uL — ABNORMAL HIGH (ref 3.87–5.11)
RDW: 16.3 % — ABNORMAL HIGH (ref 11.5–15.5)
Sodium: 138 meq/L (ref 135–145)
TSH: 4.978 microintl units/mL — ABNORMAL HIGH (ref 0.350–4.50)
WBC: 4.8 10*3/uL (ref 4.0–10.5)
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
## 2010-08-16 NOTE — Progress Notes (Signed)
  Phone Note From Other Clinic   Summary of Call: Called by ED physician patient had hypoglycemic event, she took her insulin and did not eat. She will need to be seen in clinic to arrange insulin regimen and couseling regarding insulin use for monday. thankls.
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
## 2010-08-16 NOTE — Progress Notes (Signed)
Summary: needs lancets/dmr  Phone Note Outgoing Call   Call placed by: Jamison Neighbor RD,CDE,  October 21, 2009 4:25 PM Summary of Call: gave patient a ne wlancing device today per her request. she needs lancets and alcohol wipes as well    New/Updated Medications: PRODIGY TWIST TOP LANCETS 28G  MISC (LANCETS) Please use to check sugars 4 times a day, compatible with prodigy glucometer, covered by medicaid 250.00 ALCOHOL WIPES 70 % PADS (ALCOHOL SWABS) use to inject insulin twice dialy- 250.00 PRODIGY LANCING DEVICE  MISC (LANCET DEVICES) use to check blood sugar 4 times a day before meals and bedtime- 250.00 [BMN] Prescriptions: PRODIGY LANCING DEVICE  MISC (LANCET DEVICES) use to check blood sugar 4 times a day before meals and bedtime- 250.00 Brand medically necessary #1 x 3   Entered by:   Jamison Neighbor RD,CDE   Authorized by:   [name]   Signed by:   [name] on 10/21/2009   Method used:   Electronically to        [facility] [phone]* (retail)       [address]       [place], [place]  [address]       Ph: [phone]       Fax: [phone]   RxID:   [id number] ALCOHOL WIPES 70 % PADS (ALCOHOL SWABS) use to inject insulin twice dialy- 250.00  #1 box x 11   Entered by:   Jamison Neighbor RD,CDE   Authorized by:   [name]   Signed by:   [name] on 10/21/2009   Method used:   Electronically to        [facility] [phone]* (retail)       [address]       [place], [place]  [address]       Ph: [phone]       Fax: [phone]   RxID:   [id number] PRODIGY TWIST TOP LANCETS 28G  MISC (LANCETS) Please use to check sugars 4 times a day, compatible with prodigy glucometer, covered by medicaid 250.00  #100 x 11   Entered by:   Jamison Neighbor RD,CDE   Authorized by:   [name]   Signed by:   [name] on 10/21/2009   Method used:   Electronically to        [facility] [phone]* (retail)       [address]       [place], [place]  [address]       Ph: [phone]       Fax: [phone]   RxID:   [id number]

## 2010-08-16 NOTE — Progress Notes (Signed)
Summary: refill/gg  Phone Note Refill Request   Refills Requested: Medication #1:  PRODIGY NO CODING BLOOD GLUC  STRP Please use to check your sugar 4 times daily as directed by physician  Method Requested: Electronic Initial call taken by: Merrie Roof RN,  October 06, 2009 11:37 AM  Follow-up for Phone Call        Refill approved-nurse to complete    Prescriptions: PRODIGY NO CODING BLOOD GLUC  STRP (GLUCOSE BLOOD) Please use to check your sugar 4 times daily as directed by physician, to be compatible with prodigy glucometer  #150 x 12   Entered and Authorized by:   [name]   Signed by:   [name] on 10/06/2009   Method used:   Electronically to        [facility] [phone]* (retail)       [address]       [place], [place]  [address]       Ph: [phone]       Fax: [phone]   RxID:   [id number]
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
## 2010-08-16 NOTE — Letter (Signed)
Summary: BLOOD GLUCOSE REPORT  BLOOD GLUCOSE REPORT   Imported By: Margie Billet 04/07/2010 14:36:40  _____________________________________________________________________  External Attachment:    Type:   Image     Comment:   External Document
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
## 2010-08-16 NOTE — Progress Notes (Signed)
Summary: diabetes support/dmr  Phone Note Call from Patient Call back at Home Phone [phone]   Caller: Patient Summary of Call: received call from Viera saying she stil cannot get syringes from [facility] pharmacy and only has 3 left.. Verified pharmacy and we did send it to the correct pharmacy. Told her how to reuse following reuse guidelines and not to discard any syringes until we get this resolved. Initial call taken by: Jamison Neighbor RD,CDE,  December 16, 2009 4:37 PM  Follow-up for Phone Call        called [facility] on [address]: spoke with pharmacists- they have two prescriptions and neither will go through [facility]. Explained situation and they agreed to call Medicaid to see why increased/changed prescription not going through.  Follow-up by: Jamison Neighbor RD,CDE,  December 16, 2009 4:39 PM  Additional Follow-up for Phone Call Additional follow up Details #1::        Tashon from [facility] called back: they were able to get syringes prescription to go through- they will order them and they will be ther after 3pm tomorrow. Additional Follow-up by: Jamison Neighbor RD,CDE,  December 16, 2009 4:38 PM    Additional Follow-up for Phone Call Additional follow up Details #2::    called Aliyanna and told her she could pick syringes up tomorrow. Follow-up by: Jamison Neighbor RD,CDE,  December 16, 2009 4:40 PM

## 2010-08-16 NOTE — Discharge Summary (Signed)
Summary: Hospital Discharge Update    Hospital Discharge Update:  Date of Admission: 02/07/2010 Date of Discharge: 02/10/2010  Brief Summary:  Patient was admitted with herpes zoster in V1 distribution of the right eye.  Patient was initially treated with IV acyclovir.  Patient was seen by ophthalmology, but refused exam.  Opthalmology was able to say from their limited exam that the eye might be spared.  Ophthalmology recommended switch to PO acyclovir, which was done.  Patient developed a fever to 102 on 7/26, but was treated with Tylenol which resolved the fever.  Patient was in considerable pain and treated with vicodin which improved the pain.   Lab or other results pending at discharge:  None  Other follow-up issues:  Patient's Zoster needs to be evaluated.  Also patient's blood sugars were difficult to control in the hospital.  Patient had high blood pressure in the hospital, please evaluate for hypertension and consider an ACE inhibitor.  Medication list changes:  Added new medication of NEURONTIN 300 MG CAPS (GABAPENTIN) Take 1 tablet 3 times daily - Signed Added new medication of ACYCLOVIR 800 MG TABS (ACYCLOVIR) Take 1 tablet by mouth five times daily for seven days to end on 02/16/2010 - Signed Added new medication of VICODIN 5-500 MG TABS (HYDROCODONE-ACETAMINOPHEN) Take 1 tablet by mouth every six hours as needed for pain - Signed Rx of VICODIN 5-500 MG TABS (HYDROCODONE-ACETAMINOPHEN) Take 1 tablet by mouth every six hours as needed for pain;  #120 x 0;  Signed;  Entered by: Jaci Lazier MD;  Authorized by: Jaci Lazier MD;  Method used: Print then Give to Patient Rx of NEURONTIN 300 MG CAPS (GABAPENTIN) Take 1 tablet 3 times daily;  #93 x 0;  Signed;  Entered by: Jaci Lazier MD;  Authorized by: Jaci Lazier MD;  Method used: Print then Give to Patient Rx of ACYCLOVIR 800 MG TABS (ACYCLOVIR) Take 1 tablet by mouth five times daily for seven days to end on 02/16/2010;  #20 x 0;   Signed;  Entered by: Jaci Lazier MD;  Authorized by: Jaci Lazier MD;  Method used: Print then Give to Patient Rx of NEURONTIN 300 MG CAPS (GABAPENTIN) Take 1 tablet 3 times daily;  #93 x 0;  Signed;  Entered by: Laren Everts MD;  Authorized by: Jaci Lazier MD;  Method used: Print then Give to Patient Rx of ACYCLOVIR 800 MG TABS (ACYCLOVIR) Take 1 tablet by mouth five times daily for seven days to end on 02/16/2010;  #20 x 0;  Signed;  Entered by: Laren Everts MD;  Authorized by: Jaci Lazier MD;  Method used: Print then Give to Patient Rx of VICODIN 5-500 MG TABS (HYDROCODONE-ACETAMINOPHEN) Take 1 tablet by mouth every six hours as needed for pain;  #120 x 0;  Signed;  Entered by: Laren Everts MD;  Authorized by: Jaci Lazier MD;  Method used: Print then Give to Patient Rx of NEURONTIN 300 MG CAPS (GABAPENTIN) Take 1 tablet 3 times daily;  #93 x 0;  Signed;  Entered by: Laren Everts MD;  Authorized by: Laren Everts MD;  Method used: Print then Give to Patient Rx of ACYCLOVIR 800 MG TABS (ACYCLOVIR) Take 1 tablet by mouth five times daily for seven days to end on 02/16/2010;  #20 x 0;  Signed;  Entered by: Laren Everts MD;  Authorized by: Laren Everts MD;  Method used: Print then Give to Patient Rx of VICODIN 5-500 MG TABS (HYDROCODONE-ACETAMINOPHEN) Take 1 tablet by mouth every six  hours as needed for pain;  #120 x 0;  Signed;  Entered by: Laren Everts MD;  Authorized by: Laren Everts MD;  Method used: Print then Give to Patient  Discharge medications:  RELION 70/30 70-30 % SUSP (INSULIN ISOPHANE & REGULAR) 15 units before breakfast in the  morning and 10 units before dinner in the evening. PRODIGY NO CODING BLOOD GLUC  STRP (GLUCOSE BLOOD) Please use to check your sugar 4 times daily as directed by physician, to be compatible with prodigy glucometer PRODIGY TWIST TOP LANCETS 28G  MISC (LANCETS) Please use to check  sugars 4 times a day, compatible with prodigy glucometer, covered by medicaid 250.00 B-1 100 MG TABS (THIAMINE HCL) one tablet by mouth once daily FOLIC ACID 1 MG TABS (FOLIC ACID) one tablet by mouth once daily PRODIGY BLOOD GLUCOSE MONITOR W/DEVICE KIT (BLOOD GLUCOSE MONITORING SUPPL) Please use to check sugars as directed by physician. Generics allowed if covered by medicaid PRODIGY INSULIN SYRINGE 31G X 5/16" 0.3 ML MISC (INSULIN SYRINGE-NEEDLE U-100) use to inject insulin twice a day before breakfast and before dinner PRAVACHOL 40 MG TABS (PRAVASTATIN SODIUM) Take 1 tablet by mouth once a day ALCOHOL WIPES 70 % PADS (ALCOHOL SWABS) use to inject insulin twice dialy- 250.00 PRODIGY LANCING DEVICE  MISC (LANCET DEVICES) use to check blood sugar 4 times a day before meals and bedtime- 250.00 [BMN] NEURONTIN 300 MG CAPS (GABAPENTIN) Take 1 tablet 3 times daily ACYCLOVIR 800 MG TABS (ACYCLOVIR) Take 1 tablet by mouth five times daily for seven days to end on 02/16/2010 VICODIN 5-500 MG TABS (HYDROCODONE-ACETAMINOPHEN) Take 1 tablet by mouth every six hours as needed for pain  Other patient instructions:  Please come to appt with Dr. Gwenlyn Perking on August 3 at 2:00 at [facility].   Remember to take Acyclovir 800 mg five times per day until February 16, 2010.  Take gabapentin and vicodin for the pain. Please continue home insulin of 15 units in the morning and 10 units in the evening.    Note: Hospital Discharge Medications & Other Instructions handout was printed, one copy for patient and a second copy to be placed in hospital chart.  Prescriptions: VICODIN 5-500 MG TABS (HYDROCODONE-ACETAMINOPHEN) Take 1 tablet by mouth every six hours as needed for pain  #120 x 0   Entered and Authorized by:   Laren Everts MD   Signed by:   Laren Everts MD on 02/10/2010   Method used:   Print then Give to Patient   RxID:   [id number] ACYCLOVIR 800 MG TABS  (ACYCLOVIR) Take 1 tablet by mouth five times daily for seven days to end on 02/16/2010  #20 x 0   Entered and Authorized by:   Laren Everts MD   Signed by:   Laren Everts MD on 02/10/2010   Method used:   Print then Give to Patient   RxID:   [id number] NEURONTIN 300 MG CAPS (GABAPENTIN) Take 1 tablet 3 times daily  #93 x 0   Entered and Authorized by:   Laren Everts MD   Signed by:   Laren Everts MD on 02/10/2010   Method used:   Print then Give to Patient   RxID:   [id number] VICODIN 5-500 MG TABS (HYDROCODONE-ACETAMINOPHEN) Take 1 tablet by mouth every six hours as needed for pain  #120 x 0   Entered by:   Laren Everts MD   Authorized by:   Jaci Lazier MD   Signed by:  Ramses Vega-Casasnovas MD on 02/10/2010   Method used:   Print then Give to Patient   RxID:   [id number] ACYCLOVIR 800 MG TABS (ACYCLOVIR) Take 1 tablet by mouth five times daily for seven days to end on 02/16/2010  #20 x 0   Entered by:   Laren Everts MD   Authorized by:   Jaci Lazier MD   Signed by:   Laren Everts MD on 02/10/2010   Method used:   Print then Give to Patient   RxID:   [id number] NEURONTIN 300 MG CAPS (GABAPENTIN) Take 1 tablet 3 times daily  #93 x 0   Entered by:   Laren Everts MD   Authorized by:   Jaci Lazier MD   Signed by:   Laren Everts MD on 02/10/2010   Method used:   Print then Give to Patient   RxID:   [id number] ACYCLOVIR 800 MG TABS (ACYCLOVIR) Take 1 tablet by mouth five times daily for seven days to end on 02/16/2010  #20 x 0   Entered and Authorized by:   Jaci Lazier MD   Signed by:   Jaci Lazier MD on 02/10/2010   Method used:   Print then Give to Patient   RxID:   [id number] NEURONTIN 300 MG CAPS (GABAPENTIN) Take 1 tablet 3 times daily  #93 x 0   Entered and Authorized by:   Jaci Lazier MD   Signed by:   Jaci Lazier MD on 02/10/2010   Method used:    Print then Give to Patient   RxID:   [id number] VICODIN 5-500 MG TABS (HYDROCODONE-ACETAMINOPHEN) Take 1 tablet by mouth every six hours as needed for pain  #120 x 0   Entered and Authorized by:   Jaci Lazier MD   Signed by:   Jaci Lazier MD on 02/10/2010   Method used:   Print then Give to Patient   RxID:   [id number]

## 2010-08-16 NOTE — Miscellaneous (Signed)
Summary: Orders Update  Clinical Lists Changes  Orders: Added new Test order of T- Capillary Blood Glucose (21308) - Signed Added new Test order of T-CBC w/Diff ([id number]) - Signed Observations: Added new observation of BG FINGER: 227  (04/08/2010 15:54)     Process Orders Check Orders Results:     [facility]: ABN not required for this insurance Tests Sent for requisitioning (April 16, 2010 7:29 AM):     04/08/2010: [facility] -- [facility] w/Diff [[id number]] (signed)      Laboratory Results   Blood Tests   Date/Time Received: April 08, 2010 3:56 PM Date/Time Reported: Alric Quan  April 08, 2010 3:56 PM  CBG Random:: 227mg /dL
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
## 2010-08-16 NOTE — Progress Notes (Signed)
Summary: appt/ hla  Phone Note Other Incoming   Summary of Call: ptpresents c/o R forehead, R eye area itching, burning and w/ dark colored lesions x 2 days, denies injuries, contact w/ chemicals or insects. eye does appear to be slightly swollen and pt states she has a hard time not scratching the area. she states nothing helps and not scratching makes it worse. appt set for pm Initial call taken by: Nicole Roberts RN,  February 23, 2010 12:25 PM  Follow-up for Phone Call        Agree with the need to exam [name] today in clinic. Follow-up by: Nicole Poisson MD,  February 23, 2010 2:03 PM

## 2010-08-16 NOTE — Progress Notes (Signed)
Summary: Soc. Work  Nurse, children's placed by: Social Work Summary of Call: [name] is inpatient social work.  He has already met with patient and urged her to reconnect with [facility].   She is needing monies for her acyclovir RX and told Zack we would be glad to help  her.

## 2010-08-16 NOTE — Progress Notes (Signed)
Summary: diabetes support/dmr  Phone Note Outgoing Call   Call placed by: Jamison Neighbor RD,CDE,  April 06, 2010 2:47 PM Summary of Call: called patient due to missed appointment:unable to leave a message because phone was off/outside service area.   Follow-up for Phone Call        phone was busy today, however, patient showed at clinic. Her daughter has her phone and is charging it.   Follow-up by: Jamison Neighbor RD,CDE,  April 08, 2010 3:54 PM

## 2010-08-16 NOTE — Letter (Signed)
Summary: DIABETES-BLOOD GLUCOSE REPORT 07/28-07/31/2011  DIABETES-BLOOD GLUCOSE REPORT 07/28-07/31/2011   Imported By: Shon Hough 02/23/2010 15:39:31  _____________________________________________________________________  External Attachment:    Type:   Image     Comment:   External Document

## 2010-08-16 NOTE — Progress Notes (Signed)
Summary: needs syringes nad having  hypoglycemia/dmr  Phone Note Call from Patient Call back at Home Phone [phone]   Caller: Patient Complaint: Earache/Ear Infection Summary of Call: [name] called about 3 things:  1- almost out of syringes: note prescription written for 1x/day and patient  takes 2 injections a day will request new prescription be sent to: [facility] [place] 2- having hypoglycemia- (patient reports ambulance came a few days ago and her blood sugar was 17 she has reduced her insulin doses to 15 before breakfast and 10 before dinner( which she says she doesn't take her insulin at all if her blood sugar is 'low'- I suggested she half it rather than omit or treat it and be sure it is voer 70 ( or she suggested 100) before she take her usual dose of insulint because she has Type 1 diabetes and needs insulin 24 hours) will request an appointment soon to see doctor to follow up on insulin dosing.  3- had meal planning questions which were answered Initial call taken by: Jamison Neighbor RD,CDE,  Dec 08, 2009 4:31 PM    New/Updated Medications: RELION 70/30 70-30 % SUSP (INSULIN ISOPHANE & REGULAR) 15 units before breakfast in the  morning and 10 units before dinner in the evening. PRODIGY INSULIN SYRINGE 31G X 5/16" 0.3 ML MISC (INSULIN SYRINGE-NEEDLE U-100) use to inject insulin twice a day before breakfast and before dinner Prescriptions: PRODIGY INSULIN SYRINGE 31G X 5/16" 0.3 ML MISC (INSULIN SYRINGE-NEEDLE U-100) use to inject insulin twice a day before breakfast and before dinner  #100 x 11   Entered by:   Jamison Neighbor RD,CDE   Authorized by:   [name]   Signed by:   [name] on 12/08/2009   Method used:   Electronically to        [facility] [phone]* (retail)       [address]       [place], [place]  [address]       Ph: [phone]       Fax: [phone]   RxID:   [id number]

## 2010-08-16 NOTE — Discharge Summary (Signed)
Summary: Hospital Discharge Update    Hospital Discharge Update:  Date of Admission: 03/20/2010 Date of Discharge: 03/26/2010  Brief Summary:  This is a 45 year old female with history of type 1 DM, mood disorder, and polysubstance abuse who present because of a hypoglycemic episode and AMS.  The patient continued to demonstrate psychotic symptoms unitl the 9/7 after having a severe hypoglycemic event (CBG <10) and coding.  When the patient regained conciousness, she was alert and oriented.  The patients CBG's continued to be labile, but prior to admissoin an appropriate insulin dose was established. During her admission, pt was also treated for a UTI with ciprofloxiacin.  Labs needed at follow-up: CBC with differential, Basic metabolic panel  Other labs needed at follow-up: CBG  Other follow-up issues:  outpatient behavioral health appointment set up at time of discharge by SW. Make sure she is getting psych/behavioral health follow up appointment.     Problem list changes:  Added new problem of DIABETIC HYPOGLYCEMIA, TYPE I (ICD-250.81) Added new problem of ACUTE CYSTITIS (ICD-595.0)  The medication, problem, and allergy lists have been updated.  Please see the dictated discharge summary for details.  Discharge medications:  RELION 70/30 70-30 % SUSP (INSULIN ISOPHANE & REGULAR) 16 units before breakfast in the  morning and 11 units before dinner in the evening. PRODIGY NO CODING BLOOD GLUC  STRP (GLUCOSE BLOOD) Please use to check your sugar 4 times daily as directed by physician, to be compatible with prodigy glucometer PRODIGY TWIST TOP LANCETS 28G  MISC (LANCETS) Please use to check sugars 4 times a day, compatible with prodigy glucometer, covered by medicaid 250.00 B-1 100 MG TABS (THIAMINE HCL) one tablet by mouth once daily FOLIC ACID 1 MG TABS (FOLIC ACID) one tablet by mouth once daily PRODIGY BLOOD GLUCOSE MONITOR W/DEVICE KIT (BLOOD GLUCOSE MONITORING SUPPL) Please use  to check sugars as directed by physician. Generics allowed if covered by medicaid PRODIGY INSULIN SYRINGE 31G X 5/16" 0.3 ML MISC (INSULIN SYRINGE-NEEDLE U-100) use to inject insulin twice a day before breakfast and before dinner PRAVACHOL 40 MG TABS (PRAVASTATIN SODIUM) Take 1 tablet by mouth once a day ALCOHOL WIPES 70 % PADS (ALCOHOL SWABS) use to inject insulin twice dialy- 250.00 PRODIGY LANCING DEVICE  MISC (LANCET DEVICES) use to check blood sugar 4 times a day before meals and bedtime- 250.00 [BMN] BENADRYL 25 MG TABS (DIPHENHYDRAMINE HCL) Take 1 tab by mouth at bedtime  Other patient instructions:  Please come for an appointment at the outpatient clinic at [facility] on 9/20 at3 :30 pm with Dr. Arvilla Market  for a followup visit.   Please take your medication as prescribed below.  If you have any problem, Please call the clinic.   In case of an emergency  dial 911 or go to the emergency department.   Note: Hospital Discharge Medications & Other Instructions handout was printed, one copy for patient and a second copy to be placed in hospital chart.

## 2010-08-16 NOTE — Progress Notes (Signed)
  Phone Note Outgoing Call   Call placed to: Patient Summary of Call: Called home number to check on [name].  Her daughter said that she is at [facility] right now and has been there for a week.  Apparently she was picked up at the house and brought to [facility] according to the daughter.   patient has appmt with Jamison Neighbor on October 20th and I will check in with her at that time.
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
## 2010-08-16 NOTE — Letter (Signed)
Summary: BLOOD GLUCOSE REPORT 07/30-08/04/2010  BLOOD GLUCOSE REPORT 07/30-08/04/2010   Imported By: Shon Hough 03/22/2010 10:22:51  _____________________________________________________________________  External Attachment:    Type:   Image     Comment:   External Document
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
## 2010-08-16 NOTE — Assessment & Plan Note (Signed)
Summary: HFU/SB.   Vital Signs:  Patient profile:   45 year old female Menstrual status:  regular Height:      61 inches (154.94 cm) Weight:      116.9 pounds (53.14 kg) BMI:     22.17 Temp:     98.2 degrees F (36.78 degrees C) oral Pulse rate:   92 / minute BP sitting:   107 / 69  (right arm)  Vitals Entered By: Stanton Kidney Ditzler RN (April 05, 2010 3:36 PM) Is Patient Diabetic? Yes Did you bring your meter with you today? Yes Pain Assessment Patient in pain? yes     Location: chest Intensity: 9.5 Type: sharp Onset of pain  long time Nutritional Status BMI of 19 -24 = normal Nutritional Status Detail appetite good CBG Result 53  Have you ever been in a relationship where you felt threatened, hurt or afraid?denies   Does patient need assistance? Functional Status Self care Ambulation Normal Comments HFU - doing well. Refills on meds and pain med - pt wants paper Rx. CBG rech 4:35PM 77 after OJ x 2 and glucose tabs.   Primary Care Provider:  [name]   History of Present Illness: Pt is a 45 y/o F with PMH outlined in the EMR who presents today for HFU.   She experienced severely labile CBGs in the hospital and coded with a CBG in the hospital of 10 after not eating all day.  She continues to have numerous hypoglycemic events; she has had daily hypoglycemia with CBGs in the 20-40s since hospital discharge.  Her boyfriend states that she occasionally passes out and he has to pry her mouth open to give orange juice.  He reports that she "always comes around"but he is very worried about her.   He states she often refuses to eat, especially when her CBG is >/= 200.   Pt confirms this and says she is often not hungry.    She also needs refills of her medications presribed at discharge except for thiamine,benadryl, and pravastatin.  She also states she needs vicodin- last filled in 02/2010. Pt states was treated for trichomonas during her hospital stay with flagyl. States  Rx was provided at hospital for her boyfriend.  I cannot locate any records of this in E-chart or Centricity.     She denies h/a,c/p,sob, difficultywalking, fevers,chills, or other complaint.  She has not met with Dr. Electa Sniff yet.   Depression History:      The patient denies a depressed mood most of the day and a diminished interest in her usual daily activities.         Preventive Screening-Counseling & Management  Alcohol-Tobacco     Alcohol drinks/day: 0     Smoking Status: quit     Smoking Cessation Counseling: yes     Packs/Day: 1cig/day     Year Quit: 2007     Passive Smoke Exposure: no  Caffeine-Diet-Exercise     Does Patient Exercise: yes     Type of exercise: WALKING     Exercise (avg: min/session): WHEN WEATHER ALLOWS     Times/week: 7  Current Medications (verified): 1)  Relion 70/30 70-30 % Susp (Insulin Isophane & Regular) .... 8 Units Before Breakfast in The  Morning and 5 Units Before Dinner in The Evening. 2)  Prodigy No Coding Blood Gluc  Strp (Glucose Blood) .... Please Use To Check Your Sugar 4 Times Daily As Directed By Physician, To Be Compatible With Prodigy Glucometer 3)  Prodigy Twist Top Lancets 28g  Misc (Lancets) .... Please Use To Check Sugars 4 Times A Day, Compatible With Prodigy Glucometer, Covered By Medicaid 250.00 4)  B-1 100 Mg Tabs (Thiamine Hcl) .... One Tablet By Mouth Once Daily 5)  Folic Acid 1 Mg Tabs (Folic Acid) .... One Tablet By Mouth Once Daily 6)  Prodigy Blood Glucose Monitor W/device Kit (Blood Glucose Monitoring Suppl) .... Please Use To Check Sugars As Directed By Physician. Generics Allowed If Covered By Medicaid 7)  Prodigy Insulin Syringe 31g X 5/16" 0.3 Ml Misc (Insulin Syringe-Needle U-100) .... Use To Inject Insulin Twice A Day Before Breakfast and Before Dinner 8)  Pravachol 40 Mg Tabs (Pravastatin Sodium) .... Take 1 Tablet By Mouth Once A Day 9)  Alcohol Wipes 70 % Pads (Alcohol Swabs) .... Use To Inject Insulin Twice  Dialy- 250.00 10)  Prodigy Lancing Device  Misc (Lancet Devices) .... Use To Check Blood Sugar 4 Times A Day Before Meals and Bedtime- 250.00 11)  Benadryl 25 Mg Tabs (Diphenhydramine Hcl) .... Take 1 Tab By Mouth At Bedtime  Allergies (verified): No Known Drug Allergies  Past History:  Past medical, surgical, family and social histories (including risk factors) reviewed for relevance to current acute and chronic problems.  Past Medical History: Reviewed history from 06/22/2006 and no changes required. Anemia-NOS -  Ferritin 33 1/06, B12 nl 1/06, FOBT neg 1/05 -  Further w/u NECESSARY Diabetes mellitus, type I since age 75 -  M/C nl 4/07 -  Nl Foot exam 4/07 -  DKA 1/05 -  No recent eye exam, ADDRESS NEXT VISIT Hypertension- no sig proteinuria 4/07, no LVH echo 1/06 Hypothyroidism -  TSH 6.59 and FT4 nl 4/07 -  On Synthroid in past. Now not on anything. She does not recall being on synthroid. -  Asymptomatic, Subclinical at this point? 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 Appendectomy, details not known s/p I and D of Left 4th digit abscess on hand Cataract extraction  Family History: Reviewed  history from 02/05/2008 and no changes required. No significant medical family history.  Social History: Reviewed history from 06/26/2008 and no changes required. She lives in an appartment now. She admits to abusing cocaine every now and then, last time use about 3-4 weeks ago. Alcohol use-no  Physical Exam  General:  Well-developed,well-nourished,in no acute distress; alert cooperative throughout examination Head:  atraumatic.  normocephalic.   Eyes:  vision grossly intact.   Mouth:  teeth missing.  pharynx pink and moist and poor dentition.   Neck:  supple and no masses.   Lungs:  normal breath sounds.  normal respiratory effort, no intercostal retractions, no accessory muscle use, no crackles,  and no wheezes.   Heart:  normal rate, regular rhythm, no murmur, no gallop, no rub, and no JVD.   Abdomen:  soft and non-tender.   Extremities:  no edema bilaterally Neurologic:  alert & oriented X3 and gait normal.   Skin:  turgor normal, color normal, and no rashes.   Psych:  Oriented X3, good eye contact,  easily distracted, poor concentration, memory impairment noted.   Diabetes Management Exam:    Foot Exam (with socks and/or shoes not present):       Sensory-Monofilament:          Left foot: normal          Right foot: normal   Impression & Recommendations:  Problem # 1:  DIABETES MELLITUS, TYPE I (ICD-250.01)  Pt is hypoglycemic today in clinic.  She is recovering after taking glucose tablets and drinking orange juice.  I am very concerned about her extremely labile CBGs and persistent hypoglycemia.  I am unsure pt fully comprehends the gravity and potentially fatal consequences of hypoglycemia.  Informed pt and her boyfriend that high CBGs are much safer and that it is ok for her to eat (and she should eat) even with CBGs of 200+.  Will decrease her insulin by half; advised her to take 8 units before breakfast and 5 before dinner.  Advised her to avoid pm insulin dose if she does  not eat.  Pt and boyfriend were able to explain these intructions to me multiple times during the encounter. Provided pt with glucose tablets.  Advised her to keep these on her person at all times and to take when her CBG is low.  Advised to to continue to check her CBGs to ensure they return to normal(>120)  after hypogylemic events.  Advised her to call the clinic or to go to the ER for further hypoglycemic events.  Pt willl return to clinic tomorrow to meet with Jamison Neighbor for further assistance with DM education and insulin management.   WIll have pt return in 1-2 weeks for review of CBG readings and to ensure she is not continuing to experience daily, life-threatening hypoglycemia.    Her updated medication list for this problem includes:    Relion 70/30 70-30 % Susp (Insulin isophane & regular) .Marland Kitchen... 8 units before breakfast in the  morning and 5 units before dinner in the evening.  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 Location:Dr. London Sheer of [facility]  (01/08/2008) Reviewed HgBA1c results: 8.0 (02/07/2010)  9.2 (10/21/2009)  Problem # 2:  FREQUENCY, URINARY (ICD-788.41) Pt reports increased urination and urgency. This may reflect UTI or increased diuresis 2/2 elevated blood sugar. She went to the bathroom prior to receiving sample cup.  Will obtain urine sample tomorrow when she returns to meet with Jamison Neighbor- will send for U/A and Cx. Orders: T-Culture, Urine ([id number]) T-Urinalysis ([id number])  Problem # 3:  SCREENING EXAMINATION FOR VENEREAL DISEASE (ICD-V74.5) Pt reports recent d/x of trichomonas.  I cannot find any documentation of this in E-chart or centricity.  Informed pt and her boyfriend that I will not rx any abx without  establishing a dx.  Will check U/A with cytology - would expect trich to be evident on cytological exam.  Will  also send urine for GC and chlamydia.  WIll check pt for HIV today, as well as hepB and hepC as she has a hx of extensive drug use and is at risk for these diseases.  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 Electa Sniff.  Encouraged pt to go to this appt for continued mental health management.  Problem # 5:  HYPERTENSION (ICD-401.9) BP appearsstable,if slightly lower today.  This may reflect volume depletion from osmotic diuresis 2/2 poorly controlled DM.  Encouraged pt to ensure good by mouth fluid (and food) intake. WIll checkCMET today.  Orders: T-CMP with Estimated GFR ([id number])  BP today: 107/69 Prior BP: 135/90 (03/15/2010)  Labs Reviewed: K+: 4.1 (10/21/2009) Creat: : 1.03 (10/21/2009)   Chol: 195 (10/21/2009)   HDL: 81 (10/21/2009)   LDL: 101 (10/21/2009)   TG: 65 (10/21/2009)  Problem # 6:  ANEMIA-NOS (ICD-285.9) Wil check CBC today. Her updated medication list for this problem includes:    Folic Acid 1 Mg Tabs (Folic acid) ..... One tablet by mouth once daily  Orders: T-CBC w/Diff ([id number])  Problem # 7:  HYPERLIPIDEMIA (ICD-272.4) WIll refill script for pravachol.  Her updated medication list for this problem includes:    Pravachol 40 Mg Tabs (Pravastatin sodium) .Marland Kitchen... Take 1 tablet by mouth once a day  Complete Medication List: 1)  Relion 70/30 70-30 % Susp (Insulin isophane & regular) .... 8 units before breakfast in the  morning and 5 units before dinner in the evening. 2)  Prodigy No Coding Blood Gluc Strp (Glucose blood) .... Please use to check your sugar 4 times daily as directed by physician, to be compatible with prodigy glucometer 3)  Prodigy Twist Top Lancets 28g Misc (Lancets) .... Please use to  check sugars 4 times a day, compatible with prodigy glucometer, covered by medicaid 250.00 4)  B-1 100 Mg Tabs (Thiamine hcl) .... One tablet by mouth once daily 5)  Folic Acid 1 Mg Tabs (Folic acid) .... One tablet by mouth once daily 6)  Prodigy Blood Glucose Monitor W/device Kit (Blood glucose monitoring suppl) .... Please use to check sugars as directed by physician. generics allowed if covered by medicaid 7)  Prodigy Insulin Syringe 31g X 5/16" 0.3 Ml Misc (Insulin syringe-needle u-100) .... Use to inject insulin twice a day before breakfast and before dinner 8)  Pravachol 40 Mg Tabs (Pravastatin sodium) .... Take 1 tablet by mouth once a day 9)  Alcohol Wipes 70 % Pads (Alcohol swabs) .... Use to inject insulin twice dialy- 250.00 10)  Prodigy Lancing Device Misc (Lancet devices) .... Use to check blood sugar 4 times a day before meals and bedtime- 250.00 11)  Benadryl 25 Mg Tabs (Diphenhydramine hcl) .... Take 1 tab by mouth at bedtime  Other Orders: Capillary Blood Glucose/CBG ([id number]) T-Hepatitis C Antibody ([id number]) T-Hepatitis B Core Antibody ([id number]) T-Hepatitis B Surface Antibody ([id number]) T-Hepatitis B Surface Antigen ([id number]) Psychiatric Referral (Psych)  Patient Instructions: 1)  Please schedule an appointment with your primary doctor at his next available appointment in the next 1-2 weeks. 2)  You can discuss your Vicodin prescription with Dr. Gwenlyn Perking at that time,for now we cannot fill this prescription. 3)  If Dr. Gwenlyn Perking does not have an appt in the next 1-2 weeks,please schedule an appt with any available doctor so that we can chek your blood sugar and follow up on  your labs. 4)  It is very important to keep your appointment with Jamison Neighbor tomorrow to talk about your insulin. Prescriptions: PRAVACHOL 40 MG TABS (PRAVASTATIN SODIUM) Take 1 tablet by mouth once a day  #31 x 4   Entered and Authorized by:   Nelda Bucks DO   Signed by:   Nelda Bucks DO  on 04/05/2010   Method used:   Print then Give to Patient   RxID:   [id number] B-1 100 MG TABS (THIAMINE HCL) one tablet by mouth once daily  #30 x 5   Entered and Authorized by:   Nelda Bucks DO   Signed by:   Nelda Bucks DO on 04/05/2010   Method used:   Print then Give to Patient   RxID:   [id number] FOLIC ACID 1 MG TABS (FOLIC ACID) one tablet by mouth once daily  #30 x 5   Entered and Authorized by:   Nelda Bucks DO   Signed by:   Nelda Bucks DO on 04/05/2010   Method used:   Print then Give to Patient   RxID:   [id number]  Process Orders Check Orders Results:     [facility]: ABN not required for this insurance Tests Sent for requisitioning (April 07, 2010 3:15 PM):     04/05/2010: [facility] -- T-CBC w/Diff [[id number]] (signed)     04/05/2010: [facility] -- T-Culture, Urine [[id number]] (signed)     04/05/2010: [facility] -- T-Urinalysis [[id number]] (signed)     04/05/2010: [facility] -- T-Chlamydia & GC Probe, Urine [[id number]] (signed)     04/05/2010: [facility] -- T-HIV Antibody  (Reflex) [[id number]] (signed)     04/05/2010: [facility] -- T-Hepatitis C Antibody [[id number]] (signed)     04/05/2010: [facility] -- T-Hepatitis B Core Antibody [[id number]] (signed)     04/05/2010: [facility] -- T-Hepatitis B Surface Antibody [[id number]] (signed)     04/05/2010: [facility] -- T-Hepatitis B Surface Antigen [[id number]] (signed)     04/05/2010: [facility] -- T-CMP with Estimated GFR [[id number]] (signed)     Prevention & Chronic Care Immunizations   Influenza vaccine: Not documented   Influenza vaccine deferral: Deferred  (02/12/2009)    Tetanus booster: Not documented   Td booster deferral: Deferred  (02/12/2009)    Pneumococcal vaccine: Not  documented   Pneumococcal vaccine deferral: Deferred  (02/12/2009)  Other Screening   Pap smear: Not documented   Pap smear action/deferral: Deferred  (02/07/2010)    Mammogram: Not documented   Mammogram action/deferral: Ordered  (02/07/2010)   Smoking status: quit  (04/05/2010)  Diabetes Mellitus   HgbA1C: 8.0  (02/07/2010)   HgbA1C action/deferral: Ordered  (02/07/2010)    Eye exam: 20/20 in right and left eyes IOP 17 mm Hg in right and left eyes 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 Location:[facility] London She[facility]   (01/08/2008)   Diabetic eye exam action/deferral: Deferred  (02/12/2009)   Eye exam due: 01/2009    Foot exam: yes  (04/05/2010)   Foot exam action/deferral: Do today   High risk foot: No  (10/21/2009)   Foot care education: Done  (02/23/2010)    Urine microalbumin/creatinine ratio: 28.6  (10/20/2008)  Lipids   Total Cholesterol: 195  (10/21/2009)   Lipid panel action/deferral: Lipid Panel ordered   LDL: 101  (10/21/2009)   LDL Direct: Not documented  HDL: 81  (10/21/2009)   Triglycerides: 65  (10/21/2009)    SGOT (AST): 15  (10/21/2009)   BMP action: Ordered   SGPT (ALT): 8  (10/21/2009)   Alkaline phosphatase: 61  (10/21/2009)   Total bilirubin: 0.3  (10/21/2009)  Hypertension   Last Blood Pressure: 107 / 69  (04/05/2010)   Serum creatinine: 1.03  (10/21/2009)   BMP action: Ordered   Serum potassium 4.1  (10/21/2009)  Self-Management Support :   Personal Goals (by the next clinic visit) :     Personal A1C goal: 7  (02/07/2010)     Personal blood pressure goal: 130/80  (02/12/2009)     Personal LDL goal: 70  (02/12/2009)    Patient will work on the following items until the next clinic visit to reach self-care goals:     Medications and monitoring: take my medicines every day, check my blood sugar, bring all of my medications to every visit, examine my feet every day   (04/05/2010)     Eating: drink diet soda or water instead of juice or soda, eat more vegetables, use fresh or frozen vegetables, eat foods that are low in salt, eat baked foods instead of fried foods, eat fruit for snacks and desserts, limit or avoid alcohol  (04/05/2010)     Activity: take a 30 minute walk every day, park at the far end of the parking lot  (04/05/2010)     Other: walk around the house a lot  (02/07/2010)     Home glucose monitoring frequency: 3 times a day  (02/12/2009)    Diabetes self-management support: Copy of home glucose meter record, Written self-care plan, Education handout, Resources for patients handout  (04/05/2010)   Diabetes care plan printed   Diabetes education handout printed   Last diabetes self-management training by diabetes educator: 10/21/2008   Referred for diabetes self-mgmt training.   Last medical nutrition therapy: 06/26/2008    Hypertension self-management support: Written self-care plan, Education handout, Resources for patients handout  (04/05/2010)   Hypertension self-care plan printed.   Hypertension education handout printed    Lipid self-management support: Written self-care plan, Education handout, Resources for patients handout  (04/05/2010)   Lipid self-care plan printed.   Lipid education handout printed      Resource handout printed.   Last LDL:                                                 101 (10/21/2009 9:20:00 PM)        Diabetic Foot Exam Foot Inspection Is there a history of a foot ulcer?              No Is there a foot ulcer now?              No Can the patient see the bottom of their feet?          Yes Are the shoes appropriate in style and fit?          Yes Is there swelling or an abnormal foot shape?          No Are the toenails long?                No Are the toenails thick?                No  Are the toenails ingrown?              No Is there heavy callous build-up?              No Is there a claw toe  deformity?                          No Is there elevated skin temperature?            No Is there limited ankle dorsiflexion?            No Is there foot or ankle muscle weakness?            No Do you have pain in calf while walking?           No         10-g (5.07) Semmes-Weinstein Monofilament Test Performed by: Stanton Kidney Ditzler RN          Right Foot          Left Foot Visual Inspection     normal         normal Test Control      normal         normal Site 1         normal         normal Site 2         normal         normal Site 3         normal         normal Site 4         normal         normal Site 5         normal         normal Site 6         normal         normal Site 7         normal         normal Site 8         normal         normal Site 9         normal         normal Site 10         normal         normal  Impression      normal         normal  Process Orders Check Orders Results:     [facility]: ABN not required for this insurance Tests Sent for requisitioning (April 07, 2010 3:15 PM):     04/05/2010: [facility] -- T-CBC w/Diff [[id number]] (signed)     04/05/2010: [facility] -- T-Culture, Urine [[id number]] (signed)     04/05/2010: [facility] -- T-Urinalysis [[id number]] (signed)     04/05/2010: [facility] -- T-Chlamydia & GC Probe, Urine [[id number]] (signed)     04/05/2010: [facility] -- T-HIV Antibody  (Reflex) [[id number]] (signed)     04/05/2010: [facility] -- T-Hepatitis C Antibody [[id number]] (signed)     04/05/2010: [facility] -- T-Hepatitis B Core Antibody [[id number]] (signed)     04/05/2010: [facility] -- T-Hepatitis B Surface Antibody [[id number]] (signed)     04/05/2010: [facility] -- T-Hepatitis B Surface Antigen [[id number]] (signed)     04/05/2010: [facility] -- T-CMP with  Estimated GFR [[id number]] (signed)

## 2010-08-16 NOTE — Assessment & Plan Note (Signed)
Summary: IP Visit  $3 given to patient to afford her Medicaid copay for acyclovir.   Delivered to [facility].  Patient does not know when she will be discharged but is concerned about other medications that may be prescribed.  I told her CM will update me as to needs.

## 2010-08-18 NOTE — Letter (Signed)
[facility]: [facility]   Imported By: Margie Billet 07/06/2010 10:58:00  _____________________________________________________________________  External Attachment:    Type:   Image     Comment:   External Document

## 2010-08-18 NOTE — Progress Notes (Signed)
Summary: call from case manager/dmr  Phone Note Other Incoming   Summary of Call: received a call from Jacquelyne Balint who was [name]'s case manager that she had been released from [facility] 3 weeks ago  to [employer]. Initial call taken by: Jamison Neighbor RD,CDE,  August 03, 2010 5:33 PM

## 2010-08-18 NOTE — Letter (Signed)
Summary: [facility]   Imported By: Margie Billet 07/12/2010 14:46:48  _____________________________________________________________________  External Attachment:    Type:   Image     Comment:   External Document

## 2010-08-18 NOTE — Progress Notes (Signed)
Summary: refill/  hla  Phone Note Refill Request Message from:  Fax from Pharmacy on July 26, 2010 2:55 PM  Refills Requested: Medication #1:  humulin 70/30 inject 15 units in the morning and 10 units in the evening as directed   Last Refilled: 04/08/2010 last visit 04/05/10 and w/ driley 04/08/10  Initial call taken by: Marin Roberts RN,  July 26, 2010 2:56 PM  Follow-up for Phone Call        Cancelled Dec appt and no showed Oct and Sep. Has 1/31 appt. refill till then Follow-up by: Blanch Media MD,  July 26, 2010 8:22 PM    New/Updated Medications: HUMULIN 70/30 70-30 % SUSP (INSULIN ISOPHANE & REGULAR) inject 8 units before breakfast and 5 untils before dinner. Prescriptions: HUMULIN 70/30 70-30 % SUSP (INSULIN ISOPHANE & REGULAR) inject 8 units before breakfast and 5 untils before dinner.  #1 10 mL vial x 0   Entered and Authorized by:   Blanch Media MD   Signed by:   Blanch Media MD on 07/26/2010   Method used:   Electronically to        [facility], [address] [phone]* (retail)       [address]       [place], [place]  [address]       Ph: [phone]       Fax: [phone]   RxID:   [id number]

## 2010-08-24 NOTE — Assessment & Plan Note (Signed)
Summary: EST-3 MONTH F/U VISIT/CH   Vital Signs:  Patient profile:   45 year old female Menstrual status:  regular Height:      61 inches (154.94 cm) Weight:      141.6 pounds (64.36 kg) BMI:     26.85 Temp:     97.0 degrees F (36.11 degrees C) oral Pulse rate:   96 / minute BP sitting:   109 / 72  (right arm) Cuff size:   regular  Vitals Entered By: Theotis Barrio NT II (August 16, 2010 10:21 AM) CC: ROUTINE OFFICE VISIT   /  DM  / MEDICATION REFILL   /  BACK PAIN Is Patient Diabetic? Yes Did you bring your meter with you today? No Pain Assessment Patient in pain? yes     Location: back Intensity:     8.5 Type: heaviness Onset of pain  SINCE THIS MORNING ( PAIN COMES AND GOES) Nutritional Status BMI of 25 - 29 = overweight  Have you ever been in a relationship where you felt threatened, hurt or afraid?No   Does patient need assistance? Functional Status Self care Ambulation Normal   Primary Care Provider:  [name]  CC:  ROUTINE OFFICE VISIT   /  DM  / MEDICATION REFILL   /  BACK PAIN.  History of Present Illness: This is my first visit with [name]. Please refer to the A/P for the HPI / status of the pts multiple chronic medical problems.   Preventive Screening-Counseling & Management  Alcohol-Tobacco     Alcohol drinks/day: 0     Smoking Status: quit     Smoking Cessation Counseling: yes     Packs/Day: 1cig/day     Year Quit: 2007     Passive Smoke Exposure: no  Caffeine-Diet-Exercise     Does Patient Exercise: yes     Type of exercise: WALKING     Exercise (avg: min/session): WHEN WEATHER ALLOWS     Times/week: 7  Allergies: No Known Drug Allergies  Past History:  Past Medical History: Last updated: 06/22/2006 Anemia-NOS -  Ferritin 33 1/06, B12 nl 1/06, FOBT neg 1/05 -  Further w/u NECESSARY Diabetes mellitus, type I since age 2 -  M/C nl 4/07 -  Nl Foot exam 4/07 -  DKA 1/05 -  No recent eye exam, ADDRESS NEXT  VISIT Hypertension- no sig proteinuria 4/07, no LVH echo 1/06 Hypothyroidism -  TSH 6.59 and FT4 nl 4/07 -  On Synthroid in past. Now not on anything. She does not recall being on synthroid. -  Asymptomatic, Subclinical at this point? 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 Acne per records Depression, MDD- poorly defined pschiatric d/o w/ h/o suicidal ideations Coronary artery disease, records say MI age 4, but no further records available Pancreatitis, hx of- [facility] 6/02 STD's- syphilis/gonorrhea rx'd SAB@12  wks 2002 Thal minor 6/02 Sz d/o- no records Incarceration 11/01  Review of Systems General:  Denies fever, loss of appetite, sleep disorder, and weight loss. Eyes:  Denies eye pain,  vision loss-1 eye, and vision loss-both eyes. ENT:  Denies decreased hearing, difficulty swallowing, and nasal congestion. CV:  Complains of weight gain; denies chest pain or discomfort, swelling of feet, and swelling of hands. Resp:  Denies cough and shortness of breath. GI:  Denies constipation, diarrhea, nausea, and vomiting. GU:  Complains of urinary frequency; denies dysuria. Nicole:  Complains of low back pain; denies joint pain. Derm:  Denies dryness and itching. Neuro:  Denies falling down, headaches, and poor balance. Psych:  Complains of mental problems; denies anxiety, depression, and unusual visions or sounds. Endo:  Complains of excessive hunger and weight change.  Physical Exam  General:  alert, well-developed, well-nourished, well-hydrated, and overweight-appearing.  alert, well-developed,  well-nourished, and well-hydrated.   Head:  normocephalic, atraumatic, and no abnormalities observed.  normocephalic, atraumatic, and no abnormalities observed.   Eyes:  vision grossly intact, pupils equal, and pupils round.  vision grossly intact, pupils equal, and pupils round.   Ears:  R ear normal, L ear normal, and no external deformities.  R ear normal, L ear normal, and no external deformities.   Nose:  no external deformity, no external erythema, and no nasal discharge.  no external deformity, no external erythema, and no nasal discharge.   Mouth:  poor dentition and teeth missing.  poor dentition and teeth missing.   Lungs:  no accessory muscle use.  no accessory muscle use.   Heart:  normal rate, regular rhythm, no murmur, no gallop, and no rub.  normal rate, regular rhythm, no murmur, no gallop, and no rub.   Abdomen:  soft, normal bowel sounds, and no distention.  soft, normal bowel sounds, and no distention.   Rectal:  no masses.  no masses.   Msk:  normal ROM, no joint tenderness, and no joint swelling.  normal ROM, no joint tenderness, and no joint swelling.   Extremities:  trace left pedal edema.  trace left pedal edema.   Neurologic:  alert & oriented X3 and gait normal.  Hip flexion normal strengthalert & oriented X3 and gait normal.   Skin:  turgor normal, color normal, and no rashes.  turgor normal, color normal, and no rashes.   Psych:  Oriented X3, normally interactive, good eye contact, not anxious appearing, and not depressed appearing.  Oriented X3, normally interactive, good eye contact, not anxious appearing, and not depressed appearing.   Additional Exam:  R dorsal hand trace pitting edema   Impression & Recommendations:  Problem # 1:  DIABETES MELLITUS, TYPE I (ICD-250.01)  Porr control in past. Today has [employer] and choc milk. A1C pending. [facility] checks CBG's and admin insulin - no log today. Pt cont to have lows. Cont current care and F/U A1C,  micro.  Her updated medication list for this problem includes:    Humulin 70/30 70-30 % Susp (Insulin isophane & regular) ..... Inject 8 units before breakfast and 5 untils before dinner.  Orders: T- Capillary Blood Glucose (16109) T-Hgb A1C (in-house) ([id number]) Ophthalmology Referral (Ophthalmology) T-Urine Microalbumin w/creat. ratio [id number]) T-Basic Metabolic Panel [phone])  Her updated medication list for this problem includes:    Humulin 70/30 70-30 % Susp (Insulin isophane & regular) ..... Inject 8 units before breakfast and 5 untils before dinner.  Problem # 2:  HYPERLIPIDEMIA (ICD-272.4)  Cont statin - LDL almost at goal. Her updated medication list for this problem includes:    Pravachol 40 Mg Tabs (Pravastatin sodium) .Marland Kitchen... Take 1 tablet by mouth once a day  Her updated medication list for this problem includes:    Pravachol 40 Mg Tabs (Pravastatin sodium) .Marland Kitchen... 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 No more cocaine, Tobacco, ETOH use.   Problem # 4:  HYPOTHYROIDISM (ICD-244.9) Check TSH today. No meds ever in past. Orders: T-TSH ([id number])  Problem # 5:  ANEMIA-NOS (ICD-285.9)  Check CBC Her updated medication list for this problem includes:    Folic Acid 1 Mg Tabs (Folic acid) ..... One tablet by mouth once daily  Orders: T-CBC No Diff ([id number])  Her updated medication list for this problem includes:    Folic Acid 1 Mg Tabs (Folic acid) ..... One tablet by mouth once daily  Problem # 6:  Preventive Health Care (ICD-V70.0) Eye exam referral Get last PAP [facility] foot checks Got tetanus at [facility] care in [place] Got flu per pt report PNA Aug 09 per echart tetanus 01/12/01  Complete Medication List: 1)  Humulin 70/30 70-30 % Susp (Insulin isophane & regular) .... Inject 8 units before breakfast and 5 untils before dinner. 2)  Prodigy No Coding Blood Gluc  Strp (Glucose blood) .... Please use to check your sugar 4 times daily as directed by physician, to be compatible with prodigy glucometer 3)  Prodigy Twist Top Lancets 28g Misc (Lancets) .... Please use to check sugars 4 times a day, compatible with prodigy glucometer, covered by medicaid 250.00 4)  B-1 100 Mg Tabs (Thiamine hcl) .... One tablet by mouth once daily 5)  Folic Acid 1 Mg Tabs (Folic acid) .... One tablet by mouth once daily 6)  Prodigy Blood Glucose Monitor W/device Kit (Blood glucose monitoring suppl) .... Please use to check sugars as directed by physician. generics allowed if covered by medicaid 7)  Prodigy Insulin Syringe 31g X 5/16" 0.3 Ml Misc (Insulin syringe-needle u-100) .... Use to inject insulin twice a day before breakfast and before dinner 8)  Pravachol 40 Mg Tabs (Pravastatin sodium) .... Take 1 tablet by mouth once a day 9)  Alcohol Wipes 70 % Pads (Alcohol swabs) .... Use to inject insulin twice dialy- 250.00 10)  Prodigy Lancing Device Misc (Lancet devices) .... Use to check blood sugar 4 times a day before meals and bedtime- 250.00 11)  Benadryl 25 Mg Tabs (Diphenhydramine hcl) .... Take 1 tab by mouth at bedtime   Orders Added: 1)  T- Capillary Blood Glucose [[id number]] 2)  T-Hgb A1C (in-house) [[id number]] 3)  Ophthalmology Referral [Ophthalmology] 4)  T-Urine Microalbumin w/creat. ratio [[id number]] 5)  T-Basic Metabolic Panel [[id number]] 6)  T-CBC No Diff [[id number]] 7)  T-TSH [[id number]] 8)  Est. Patient Level IV [29528]   Process Orders Check Orders Results:     [facility]: ABN not required for this insurance Tests Sent for requisitioning (August 16, 2010 11:21 AM):     08/16/2010: [facility] -- T-Urine Microalbumin w/creat. ratio [[id number]] (signed)     08/16/2010: [facility] -- T-Basic Metabolic Panel [phone] (signed)     08/16/2010: [facility] -- T-CBC No Diff  [[id number]] (signed)     08/16/2010: [facility] -- T-TSH [phone] (signed)     Prevention & Chronic Care Immunizations   Influenza vaccine: Not documented   Influenza vaccine deferral: Deferred  (02/12/2009)    Tetanus booster: Not documented   Td booster deferral: Deferred  (02/12/2009)    Pneumococcal vaccine: Not documented   Pneumococcal  vaccine deferral: Deferred  (02/12/2009)  Other Screening   Pap smear: Not documented   Pap smear action/deferral: Deferred  (02/07/2010)    Mammogram: Not documented   Mammogram action/deferral: Ordered  (02/07/2010)   Smoking status: quit  (08/16/2010)  Diabetes Mellitus   HgbA1C: 8.0  (02/07/2010)   HgbA1C action/deferral: Ordered  (02/07/2010)    Eye exam: 20/20 in right and left eyes IOP 17 mm Hg in right and left eyes 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 Location:[facility]   (01/08/2008)   Diabetic eye exam action/deferral: Ophthalmology referral  (08/16/2010)   Eye exam due: 01/2009    Foot exam: yes  (04/05/2010)   Foot exam action/deferral: Do today   High risk foot: No  (10/21/2009)   Foot care education: Done  (02/23/2010)    Urine microalbumin/creatinine ratio: 28.6  (10/20/2008)   Urine microalbumin action/deferral: Ordered    Diabetes flowsheet reviewed?: Yes   Progress toward A1C goal: Unchanged    Stage of readiness to change (diabetes management): Action  Lipids   Total Cholesterol: 195  (10/21/2009)   Lipid panel action/deferral: Lipid Panel ordered   LDL: 101  (10/21/2009)   LDL Direct: Not documented   HDL: 81  (10/21/2009)   Triglycerides: 65  (10/21/2009)    SGOT (AST): 21  (04/05/2010)   BMP action: Ordered   SGPT (ALT): 11  (04/05/2010)   Alkaline phosphatase: 58  (04/05/2010)   Total bilirubin: 0.1  (04/05/2010)    Lipid flowsheet reviewed?: Yes   Progress toward LDL goal:  Improved    Stage of readiness to change (lipid management): Preparation  Hypertension   Last Blood Pressure: 109 / 72  (08/16/2010)   Serum creatinine: 0.91  (04/05/2010)   BMP action: Ordered   Serum potassium 3.8  (04/05/2010)  Self-Management Support :   Personal Goals (by the next clinic visit) :     Personal A1C goal: 7  (02/07/2010)     Personal blood pressure goal: 130/80  (02/12/2009)     Personal LDL goal: 70  (02/12/2009)    Patient will work on the following items until the next clinic visit to reach self-care goals:     Medications and monitoring: take my medicines every day, check my blood sugar, check my blood pressure, examine my feet every day  (08/16/2010)     Eating: drink diet soda or water instead of juice or soda, eat more vegetables, use fresh or frozen vegetables, eat foods that are low in salt, eat baked foods instead of fried foods, eat fruit for snacks and desserts, limit or avoid alcohol  (08/16/2010)     Activity: take a 30 minute walk every day  (08/16/2010)     Other: walk around the house a lot  (02/07/2010)     Home glucose monitoring frequency: 3 times a day  (02/12/2009)    Diabetes self-management support: Resources for patients handout  (08/16/2010)   Last diabetes self-management training by diabetes educator: 04/08/2010   Last medical nutrition therapy: 06/26/2008    Hypertension self-management support: Resources for patients handout  (08/16/2010)    Lipid self-management support: Resources for patients handout  (08/16/2010)        Resource handout printed.   Nursing Instructions: Refer for screening diabetic eye exam (see order)    Appended Document: Lab Order/a1c cbg results    Lab Visit  Laboratory Results   Blood Tests  Date/Time Received: August 16, 2010 11:37 AM Date/Time Reported: Alric Quan  August 16, 2010 11:37 AM   HGBA1C: 10.3%   (Normal Range: Non-Diabetic - 3-6%   Control Diabetic - 6-8%) CBG Random::  179mg /dL    Orders Today:

## 2010-09-01 ENCOUNTER — Encounter: Payer: Self-pay | Admitting: Internal Medicine

## 2010-09-01 ENCOUNTER — Ambulatory Visit (INDEPENDENT_AMBULATORY_CARE_PROVIDER_SITE_OTHER): Payer: Medicaid Other | Admitting: Internal Medicine

## 2010-09-01 VITALS — BP 112/72 | HR 104 | Temp 97.4°F | Ht 61.0 in | Wt 134.9 lb

## 2010-09-01 DIAGNOSIS — E119 Type 2 diabetes mellitus without complications: Secondary | ICD-10-CM

## 2010-09-01 DIAGNOSIS — E039 Hypothyroidism, unspecified: Secondary | ICD-10-CM

## 2010-09-01 DIAGNOSIS — E109 Type 1 diabetes mellitus without complications: Secondary | ICD-10-CM

## 2010-09-01 DIAGNOSIS — I10 Essential (primary) hypertension: Secondary | ICD-10-CM

## 2010-09-01 DIAGNOSIS — F489 Nonpsychotic mental disorder, unspecified: Secondary | ICD-10-CM

## 2010-09-01 DIAGNOSIS — D649 Anemia, unspecified: Secondary | ICD-10-CM

## 2010-09-01 DIAGNOSIS — F99 Mental disorder, not otherwise specified: Secondary | ICD-10-CM

## 2010-09-01 DIAGNOSIS — E785 Hyperlipidemia, unspecified: Secondary | ICD-10-CM

## 2010-09-01 NOTE — Assessment & Plan Note (Signed)
Patient has microcytic anemia with baseline hemoglobin of 11-12 and MCV of 66.  Her last anemia panel was checked August 17, 2003 and the results were at patient's baseline.

## 2010-09-01 NOTE — Progress Notes (Signed)
  Subjective:    Patient ID: [name], female    DOB: 10-01-1965, 45 y.o.   MRN: [id number]  HPI   patient is 45 year old female with past medical history outlined below presents to clinic for regular follow up on her blood pressure and diabetes.  She has no concerns today, denies any episodes of chest pain,  No shortness of breath, no cough, no fever or chills, no abdominal or urinary concern.  She reports compliance with medications for her blood pressure, diabetes, and depression, and also reports compliance with her recommended diet. She denies recent sicknesses. Reports having good appetite and has managed to gain a few pounds. She  Lives at [facility] he reports that she likes staying over there.  Review of Systems  Constitutional: Negative.   HENT: Negative.   Respiratory: Negative.   Cardiovascular: Negative.   Gastrointestinal: Negative.   Genitourinary: Negative.   Musculoskeletal: Negative.   Psychiatric/Behavioral: Negative.        Objective:   Physical Exam  Constitutional: She is oriented to person, place, and time. She appears well-developed and well-nourished. No distress.  Neck: Normal range of motion. Neck supple. No JVD present. No tracheal deviation present. No thyromegaly present.  Cardiovascular: Normal rate, regular rhythm, normal heart sounds and intact distal pulses.  Exam reveals no gallop and no friction rub.   Pulmonary/Chest: Effort normal and breath sounds normal. No respiratory distress. She has no wheezes. She has no rales. She exhibits no tenderness.  Abdominal: Soft. Bowel sounds are normal. She exhibits no distension and no mass. There is no tenderness. There is no rebound and no guarding.  Musculoskeletal: Normal range of motion. She exhibits edema. She exhibits no tenderness.  Lymphadenopathy:    She has no cervical adenopathy.  Neurological: She is alert and oriented to person, place, and time.  Skin: Skin is warm and dry. No rash  noted. She is not diaphoretic. No erythema. No pallor.  Psychiatric: She has a normal mood and affect. Her behavior is normal. Judgment and thought content normal.          Assessment & Plan:

## 2010-09-01 NOTE — Assessment & Plan Note (Signed)
TSH checked August 16, 2010 was 4.978.  SS TSH in 3 months on patient's next follow up appointment and we'll readjust medication regimen as indicated. Currently patient is not on any medications for hypothyroidism in addition she has no symptoms hypothyroidism.

## 2010-09-01 NOTE — Assessment & Plan Note (Signed)
Patient appears to have good judgment today with no signs of depression, she is being followed by mental care department where she gets her medications as well.  We'll continue to follow up on their recommendations. No recent changes in medication regimen were made.

## 2010-09-01 NOTE — Assessment & Plan Note (Signed)
Blood pressure is at goal, we'll continue to monitor, no changes to medication regimen will be made today. Patient was advised to have blood pressure checked regularly and to call us back if her numbers are higher than 160/90

## 2010-09-01 NOTE — Assessment & Plan Note (Signed)
Patient did not want to have fasting lipid panel done today, this can be checked on her next visit with the rest of the labs.  For now we'll continue the same medication regimen with pravastatin. Goal LDL was discussed with patient.

## 2010-09-01 NOTE — Patient Instructions (Signed)
Please schedule follow up appointment in 3 months

## 2010-09-01 NOTE — Assessment & Plan Note (Signed)
Hemoglobin A1c was checked August 16, 2010 and the value is 10.3. This is a significant increase from previous level of 8, 3 months ago. Diabetes management was discussed with patient and she reports compliance with her present medicines.  She currently lives in an assisted facility where they check her sugar levels frequently and ensure that she receives her medications daily.  We'll reassess A1c in 3 months and will readjust medication regimen as indicated.

## 2010-09-29 LAB — BASIC METABOLIC PANEL
BUN: 14 mg/dL (ref 6–23)
CO2: 19 mEq/L (ref 19–32)
CO2: 19 mEq/L (ref 19–32)
CO2: 22 mEq/L (ref 19–32)
CO2: 25 mEq/L (ref 19–32)
Calcium: 8.2 mg/dL — ABNORMAL LOW (ref 8.4–10.5)
Calcium: 8.5 mg/dL (ref 8.4–10.5)
Calcium: 8.5 mg/dL (ref 8.4–10.5)
Calcium: 8.9 mg/dL (ref 8.4–10.5)
Calcium: 9 mg/dL (ref 8.4–10.5)
Chloride: 102 mEq/L (ref 96–112)
Chloride: 102 mEq/L (ref 96–112)
Chloride: 104 mEq/L (ref 96–112)
Creatinine, Ser: 0.86 mg/dL (ref 0.4–1.2)
Creatinine, Ser: 0.99 mg/dL (ref 0.4–1.2)
Creatinine, Ser: 1.05 mg/dL (ref 0.4–1.2)
Creatinine, Ser: 1.07 mg/dL (ref 0.4–1.2)
GFR calc Af Amer: >60 mL/min (ref >60)
GFR calc Af Amer: >60 mL/min (ref >60)
GFR calc Af Amer: >60 mL/min (ref >60)
GFR calc Af Amer: >60 mL/min (ref >60)
GFR calc Af Amer: >60 mL/min (ref >60)
GFR calc non Af Amer: 56 mL/min — ABNORMAL LOW (ref >60)
GFR calc non Af Amer: >60 mL/min (ref >60)
GFR calc non Af Amer: >60 mL/min (ref >60)
GFR calc non Af Amer: >60 mL/min (ref >60)
Glucose, Bld: 331 mg/dL — ABNORMAL HIGH (ref 70–99)
Glucose, Bld: 399 mg/dL — ABNORMAL HIGH (ref 70–99)
Glucose, Bld: 401 mg/dL — ABNORMAL HIGH (ref 70–99)
Glucose, Bld: 454 mg/dL — ABNORMAL HIGH (ref 70–99)
Glucose, Bld: 169 mg/dL — ABNORMAL HIGH (ref 70–99)
Potassium: 3.9 mEq/L (ref 3.5–5.1)
Potassium: 3.5 mEq/L (ref 3.5–5.1)
Sodium: 131 mEq/L — ABNORMAL LOW (ref 135–145)
Sodium: 132 mEq/L — ABNORMAL LOW (ref 135–145)
Sodium: 135 mEq/L (ref 135–145)
Sodium: 135 mEq/L (ref 135–145)
Sodium: 138 mEq/L (ref 135–145)
Sodium: 139 mEq/L (ref 135–145)

## 2010-09-29 LAB — GLUCOSE, CAPILLARY
Glucose-Capillary: 145 mg/dL — ABNORMAL HIGH (ref 70–99)
Glucose-Capillary: 227 mg/dL — ABNORMAL HIGH (ref 70–99)
Glucose-Capillary: 40 mg/dL — CL (ref 70–99)
Glucose-Capillary: 77 mg/dL (ref 70–99)
Glucose-Capillary: 12 mg/dL — CL (ref 70–99)
Glucose-Capillary: 133 mg/dL — ABNORMAL HIGH (ref 70–99)
Glucose-Capillary: 135 mg/dL — ABNORMAL HIGH (ref 70–99)
Glucose-Capillary: 162 mg/dL — ABNORMAL HIGH (ref 70–99)
Glucose-Capillary: 163 mg/dL — ABNORMAL HIGH (ref 70–99)
Glucose-Capillary: 165 mg/dL — ABNORMAL HIGH (ref 70–99)
Glucose-Capillary: 172 mg/dL — ABNORMAL HIGH (ref 70–99)
Glucose-Capillary: 200 mg/dL — ABNORMAL HIGH (ref 70–99)
Glucose-Capillary: 204 mg/dL — ABNORMAL HIGH (ref 70–99)
Glucose-Capillary: 206 mg/dL — ABNORMAL HIGH (ref 70–99)
Glucose-Capillary: 259 mg/dL — ABNORMAL HIGH (ref 70–99)
Glucose-Capillary: 268 mg/dL — ABNORMAL HIGH (ref 70–99)
Glucose-Capillary: 271 mg/dL — ABNORMAL HIGH (ref 70–99)
Glucose-Capillary: 274 mg/dL — ABNORMAL HIGH (ref 70–99)
Glucose-Capillary: 276 mg/dL — ABNORMAL HIGH (ref 70–99)
Glucose-Capillary: 282 mg/dL — ABNORMAL HIGH (ref 70–99)
Glucose-Capillary: 288 mg/dL — ABNORMAL HIGH (ref 70–99)
Glucose-Capillary: 290 mg/dL — ABNORMAL HIGH (ref 70–99)
Glucose-Capillary: 303 mg/dL — ABNORMAL HIGH (ref 70–99)
Glucose-Capillary: 304 mg/dL — ABNORMAL HIGH (ref 70–99)
Glucose-Capillary: 314 mg/dL — ABNORMAL HIGH (ref 70–99)
Glucose-Capillary: 314 mg/dL — ABNORMAL HIGH (ref 70–99)
Glucose-Capillary: 338 mg/dL — ABNORMAL HIGH (ref 70–99)
Glucose-Capillary: 354 mg/dL — ABNORMAL HIGH (ref 70–99)
Glucose-Capillary: 356 mg/dL — ABNORMAL HIGH (ref 70–99)
Glucose-Capillary: 367 mg/dL — ABNORMAL HIGH (ref 70–99)
Glucose-Capillary: 373 mg/dL — ABNORMAL HIGH (ref 70–99)
Glucose-Capillary: 399 mg/dL — ABNORMAL HIGH (ref 70–99)
Glucose-Capillary: 400 mg/dL — ABNORMAL HIGH (ref 70–99)
Glucose-Capillary: 400 mg/dL — ABNORMAL HIGH (ref 70–99)
Glucose-Capillary: 401 mg/dL — ABNORMAL HIGH (ref 70–99)
Glucose-Capillary: 406 mg/dL — ABNORMAL HIGH (ref 70–99)
Glucose-Capillary: 435 mg/dL — ABNORMAL HIGH (ref 70–99)
Glucose-Capillary: 448 mg/dL — ABNORMAL HIGH (ref 70–99)
Glucose-Capillary: 448 mg/dL — ABNORMAL HIGH (ref 70–99)
Glucose-Capillary: 466 mg/dL — ABNORMAL HIGH (ref 70–99)
Glucose-Capillary: 469 mg/dL — ABNORMAL HIGH (ref 70–99)
Glucose-Capillary: 480 mg/dL — ABNORMAL HIGH (ref 70–99)
Glucose-Capillary: 480 mg/dL — ABNORMAL HIGH (ref 70–99)
Glucose-Capillary: 538 mg/dL — ABNORMAL HIGH (ref 70–99)
Glucose-Capillary: 556 mg/dL (ref 70–99)
Glucose-Capillary: 571 mg/dL (ref 70–99)
Glucose-Capillary: 95 mg/dL (ref 70–99)
Glucose-Capillary: 226 mg/dL — ABNORMAL HIGH (ref 70–99)

## 2010-09-29 LAB — IRON AND TIBC
Iron: 37 ug/dL — ABNORMAL LOW (ref 42–135)
Saturation Ratios: 14 % — ABNORMAL LOW (ref 20–55)
TIBC: 261 ug/dL (ref 250–470)
UIBC: 224 ug/dL

## 2010-09-29 LAB — URINE CULTURE
Colony Count: >=100000
Culture  Setup Time: 201109041144

## 2010-09-29 LAB — POCT I-STAT, CHEM 8
Calcium, Ion: 1.1 mmol/L — ABNORMAL LOW (ref 1.12–1.32)
Chloride: 105 mEq/L (ref 96–112)
Chloride: 100 mEq/L (ref 96–112)
Creatinine, Ser: 1 mg/dL (ref 0.4–1.2)
Glucose, Bld: 157 mg/dL — ABNORMAL HIGH (ref 70–99)
Glucose, Bld: 200 mg/dL — ABNORMAL HIGH (ref 70–99)
HCT: 40 % (ref 36.0–46.0)
HCT: 43 % (ref 36.0–46.0)
Hemoglobin: 13.6 g/dL (ref 12.0–15.0)
Potassium: 3.2 mEq/L — ABNORMAL LOW (ref 3.5–5.1)
TCO2: 26 mmol/L (ref 0–100)

## 2010-09-29 LAB — HEMOGLOBIN A1C
Hgb A1c MFr Bld: 8.2 % — ABNORMAL HIGH
Mean Plasma Glucose: 189 mg/dL — ABNORMAL HIGH

## 2010-09-29 LAB — CBC
HCT: 33.5 % — ABNORMAL LOW (ref 36.0–46.0)
HCT: 36.9 % (ref 36.0–46.0)
Hemoglobin: 10.6 g/dL — ABNORMAL LOW (ref 12.0–15.0)
Hemoglobin: 10.7 g/dL — ABNORMAL LOW (ref 12.0–15.0)
Hemoglobin: 10.9 g/dL — ABNORMAL LOW (ref 12.0–15.0)
Hemoglobin: 11.2 g/dL — ABNORMAL LOW (ref 12.0–15.0)
MCH: 20.8 pg — ABNORMAL LOW (ref 26.0–34.0)
MCH: 20.9 pg — ABNORMAL LOW (ref 26.0–34.0)
MCH: 21 pg — ABNORMAL LOW (ref 26.0–34.0)
MCH: 21.4 pg — ABNORMAL LOW (ref 26.0–34.0)
MCHC: 31.6 g/dL (ref 30.0–36.0)
MCHC: 32.1 g/dL (ref 30.0–36.0)
MCHC: 32.2 g/dL (ref 30.0–36.0)
MCHC: 32.4 g/dL (ref 30.0–36.0)
MCHC: 32.7 g/dL (ref 30.0–36.0)
MCHC: 31.5 g/dL (ref 30.0–36.0)
MCV: 64.2 fL — ABNORMAL LOW (ref 78.0–100.0)
MCV: 65.2 fL — ABNORMAL LOW (ref 78.0–100.0)
MCV: 65.7 fL — ABNORMAL LOW (ref 78.0–100.0)
MCV: 65.9 fL — ABNORMAL LOW (ref 78.0–100.0)
Platelets: 266 10*3/uL (ref 150–400)
Platelets: 309 10*3/uL (ref 150–400)
Platelets: 325 10*3/uL (ref 150–400)
Platelets: 338 10*3/uL (ref 150–400)
Platelets: 221 10*3/uL (ref 150–400)
RBC: 5.09 MIL/uL (ref 3.87–5.11)
RBC: 5.1 MIL/uL (ref 3.87–5.11)
RBC: 5.6 MIL/uL — ABNORMAL HIGH (ref 3.87–5.11)
RDW: 16.6 % — ABNORMAL HIGH (ref 11.5–15.5)
RDW: 16.9 % — ABNORMAL HIGH (ref 11.5–15.5)
RDW: 17.3 % — ABNORMAL HIGH (ref 11.5–15.5)
WBC: 16.9 10*3/uL — ABNORMAL HIGH (ref 4.0–10.5)
WBC: 3.2 10*3/uL — ABNORMAL LOW (ref 4.0–10.5)

## 2010-09-29 LAB — COMPREHENSIVE METABOLIC PANEL
ALT: 25 U/L (ref 0–35)
AST: 20 U/L (ref 0–37)
Albumin: 4 g/dL (ref 3.5–5.2)
Albumin: 3.8 g/dL (ref 3.5–5.2)
Alkaline Phosphatase: 52 U/L (ref 39–117)
Calcium: 8.4 mg/dL (ref 8.4–10.5)
Chloride: 103 mEq/L (ref 96–112)
Creatinine, Ser: 0.92 mg/dL (ref 0.4–1.2)
GFR calc Af Amer: >60 mL/min (ref >60)
GFR calc non Af Amer: >60 mL/min (ref >60)
Glucose, Bld: 220 mg/dL — ABNORMAL HIGH (ref 70–99)
Potassium: 4.2 mEq/L (ref 3.5–5.1)
Sodium: 138 mEq/L (ref 135–145)
Total Bilirubin: 0.4 mg/dL (ref 0.3–1.2)
Total Protein: 8 g/dL (ref 6.0–8.3)

## 2010-09-29 LAB — URINE MICROSCOPIC-ADD ON

## 2010-09-29 LAB — RAPID URINE DRUG SCREEN, HOSP PERFORMED
Amphetamines: NOT DETECTED
Amphetamines: NOT DETECTED
Barbiturates: NOT DETECTED
Barbiturates: NOT DETECTED
Benzodiazepines: NOT DETECTED
Benzodiazepines: NOT DETECTED
Benzodiazepines: NOT DETECTED
Cocaine: POSITIVE — AB
Cocaine: POSITIVE — AB
Opiates: NOT DETECTED
Opiates: NOT DETECTED
Opiates: NOT DETECTED
Tetrahydrocannabinol: NOT DETECTED

## 2010-09-29 LAB — DIFFERENTIAL
Basophils Absolute: 0 10*3/uL (ref 0.0–0.1)
Basophils Absolute: 0 10*3/uL (ref 0.0–0.1)
Basophils Relative: 0 % (ref 0–1)
Eosinophils Absolute: 0 10*3/uL (ref 0.0–0.7)
Eosinophils Absolute: 0 10*3/uL (ref 0.0–0.7)
Monocytes Absolute: 0.7 10*3/uL (ref 0.1–1.0)
Monocytes Absolute: 0.9 10*3/uL (ref 0.1–1.0)
Neutro Abs: 15.4 10*3/uL — ABNORMAL HIGH (ref 1.7–7.7)
Neutrophils Relative %: 90 % — ABNORMAL HIGH (ref 43–77)

## 2010-09-29 LAB — MRSA PCR SCREENING
MRSA by PCR: NEGATIVE
MRSA by PCR: NEGATIVE
MRSA by PCR: NEGATIVE

## 2010-09-29 LAB — BLOOD GAS, ARTERIAL
Acid-base deficit: 2.1 mmol/L — ABNORMAL HIGH (ref 0.0–2.0)
Bicarbonate: 22 mEq/L (ref 20.0–24.0)
Drawn by: 12971
O2 Saturation: 97.5 %
Patient temperature: 98.6
pO2, Arterial: 90.4 mmHg (ref 80.0–100.0)

## 2010-09-29 LAB — VITAMIN B12: Vitamin B-12: 1027 pg/mL — ABNORMAL HIGH (ref 211–911)

## 2010-09-29 LAB — URINALYSIS, ROUTINE W REFLEX MICROSCOPIC
Bilirubin Urine: NEGATIVE
Glucose, UA: NEGATIVE mg/dL
Hgb urine dipstick: NEGATIVE
Ketones, ur: 15 mg/dL — AB
Leukocytes, UA: NEGATIVE
Nitrite: NEGATIVE
Protein, ur: NEGATIVE mg/dL
Protein, ur: NEGATIVE mg/dL
Urobilinogen, UA: 1 mg/dL (ref 0.0–1.0)
pH: 7.5 (ref 5.0–8.0)
pH: 8 (ref 5.0–8.0)

## 2010-09-29 LAB — CARDIAC PANEL(CRET KIN+CKTOT+MB+TROPI)
CK, MB: 3.8 ng/mL (ref 0.3–4.0)
CK, MB: 3.8 ng/mL (ref 0.3–4.0)
Relative Index: 0.8 (ref 0.0–2.5)
Relative Index: 0.8 (ref 0.0–2.5)
Total CK: 489 U/L — ABNORMAL HIGH (ref 7–177)
Total CK: 492 U/L — ABNORMAL HIGH (ref 7–177)
Troponin I: 0.01 ng/mL (ref 0.00–0.06)

## 2010-09-29 LAB — ETHANOL: Alcohol, Ethyl (B): <5 mg/dL (ref 0–10)

## 2010-09-29 LAB — FERRITIN: Ferritin: 12 ng/mL (ref 10–291)

## 2010-09-29 LAB — RETICULOCYTES
RBC.: 5.32 MIL/uL — ABNORMAL HIGH (ref 3.87–5.11)
Retic Count, Absolute: 53.2 K/uL (ref 19.0–186.0)
Retic Ct Pct: 1 % (ref 0.4–3.1)

## 2010-09-29 LAB — LIPID PANEL
Cholesterol: 141 mg/dL (ref 0–200)
LDL Cholesterol: 67 mg/dL (ref 0–99)
Triglycerides: 33 mg/dL (ref <150)

## 2010-09-29 LAB — T4: T4, Total: 8.2 ug/dL (ref 5.0–12.5)

## 2010-09-29 LAB — TSH: TSH: 2.305 u[IU]/mL (ref 0.350–4.500)

## 2010-09-29 LAB — GLUCOSE, RANDOM: Glucose, Bld: 462 mg/dL — ABNORMAL HIGH (ref 70–99)

## 2010-09-29 LAB — FOLATE: Folate: >20 ng/mL

## 2010-10-01 LAB — GLUCOSE, CAPILLARY
Glucose-Capillary: 103 mg/dL — ABNORMAL HIGH (ref 70–99)
Glucose-Capillary: 171 mg/dL — ABNORMAL HIGH (ref 70–99)
Glucose-Capillary: 189 mg/dL — ABNORMAL HIGH (ref 70–99)
Glucose-Capillary: 210 mg/dL — ABNORMAL HIGH (ref 70–99)
Glucose-Capillary: 258 mg/dL — ABNORMAL HIGH (ref 70–99)
Glucose-Capillary: 268 mg/dL — ABNORMAL HIGH (ref 70–99)
Glucose-Capillary: 318 mg/dL — ABNORMAL HIGH (ref 70–99)
Glucose-Capillary: 337 mg/dL — ABNORMAL HIGH (ref 70–99)

## 2010-10-01 LAB — CBC
Hemoglobin: 10.7 g/dL — ABNORMAL LOW (ref 12.0–15.0)
Platelets: 163 10*3/uL (ref 150–400)
RBC: 5.05 MIL/uL (ref 3.87–5.11)
WBC: 3.5 10*3/uL — ABNORMAL LOW (ref 4.0–10.5)

## 2010-10-01 LAB — BASIC METABOLIC PANEL
Calcium: 8.4 mg/dL (ref 8.4–10.5)
GFR calc Af Amer: >60 mL/min (ref >60)
GFR calc non Af Amer: 54 mL/min — ABNORMAL LOW (ref >60)
Sodium: 134 mEq/L — ABNORMAL LOW (ref 135–145)

## 2010-10-01 LAB — HIV ANTIBODY (ROUTINE TESTING W REFLEX): HIV: NONREACTIVE

## 2010-10-01 LAB — MRSA PCR SCREENING: MRSA by PCR: NEGATIVE

## 2010-10-05 ENCOUNTER — Ambulatory Visit (INDEPENDENT_AMBULATORY_CARE_PROVIDER_SITE_OTHER): Payer: Medicaid Other | Admitting: Internal Medicine

## 2010-10-05 ENCOUNTER — Encounter: Payer: Self-pay | Admitting: Internal Medicine

## 2010-10-05 VITALS — BP 91/64 | HR 95 | Temp 98.0°F | Ht 61.0 in | Wt 140.5 lb

## 2010-10-05 DIAGNOSIS — J029 Acute pharyngitis, unspecified: Secondary | ICD-10-CM | POA: Insufficient documentation

## 2010-10-05 DIAGNOSIS — E109 Type 1 diabetes mellitus without complications: Secondary | ICD-10-CM

## 2010-10-05 DIAGNOSIS — I10 Essential (primary) hypertension: Secondary | ICD-10-CM

## 2010-10-05 LAB — GLUCOSE, CAPILLARY
Glucose-Capillary: 231 mg/dL — ABNORMAL HIGH (ref 70–99)
Glucose-Capillary: 375 mg/dL — ABNORMAL HIGH (ref 70–99)

## 2010-10-05 MED ORDER — INSULIN LISPRO 100 UNIT/ML ~~LOC~~ SOLN: SUBCUTANEOUS | Status: DC

## 2010-10-05 MED ORDER — ACETAMINOPHEN 500 MG PO TABS: 500.0000 mg | ORAL_TABLET | ORAL | Status: DC | PRN

## 2010-10-05 MED ORDER — GUAIFENESIN ER 600 MG PO TB12: 600.0000 mg | ORAL_TABLET | Freq: Two times a day (BID) | ORAL | Status: DC | PRN

## 2010-10-05 NOTE — Assessment & Plan Note (Signed)
Lab Results  Component Value Date   HGBA1C  Value: 8.2 (NOTE)                                                                       According to the ADA Clinical Practice Recommendations for 2011, when HbA1c is used as a screening test:   >=6.5%   Diagnostic of Diabetes Mellitus           (if abnormal result  is confirmed)  5.7-6.4%   Increased risk of developing Diabetes Mellitus  References:Diagnosis and Classification of Diabetes Mellitus,Diabetes Care,2011,34(Suppl 1):S62-S69 and Standards of Medical Care in         Diabetes - 2011,Diabetes Care,2011,34  (Suppl 1):S11-S61.* 03/20/2010   CREATININE 0.93 08/16/2010   MICROALBUR 0.62 08/16/2010   MICRALBCREAT 5.5 08/16/2010   CHOL  Value: 141        ATP III CLASSIFICATION:  <200     mg/dL   Desirable  284-132  mg/dL   Borderline High  >=440    mg/dL   High        1/0/2725   HDL 67 03/20/2010   TRIG 33 09/20/6438    Last eye exam and foot exam:    Component Value Date/Time   HMDIABEYEEXA Mild diabetic retinopathy 01/08/2008     Her CBg poorly controlled and mostly in PM, so will increase her lunch and dinner novolog slightly as her has hx of hypoglycemia. Will recheck in about 3-4 weeks.
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
## 2010-10-05 NOTE — Assessment & Plan Note (Addendum)
Lab Results  Component Value Date   NA 138 08/16/2010   K 4.6 08/16/2010   CL 97 08/16/2010   CO2 26 08/16/2010   BUN 18 08/16/2010   CREATININE 0.93 08/16/2010    BP Readings from Last 3 Encounters:  10/05/10 91/64  09/01/10 112/72  08/16/10 109/72   BP is soft, but no symptoms, may be due to low intake and encouraged her to drink more fluid and this will also help her URI.  She understands. Will recheck BP at next visit.

## 2010-10-05 NOTE — Patient Instructions (Signed)
Please take all your medications as instructed in your instructions.   Please call the Clinic for appointment or go to Emergency Department if your symptoms do not improve or get worse.  Please bring your blood sugar logo with you at next visit.

## 2010-10-05 NOTE — Assessment & Plan Note (Signed)
This is likely due to viral URI and PNA less likely. Will give tylenol and mucinex for symptom control and also asked her to drink more fluids.

## 2010-10-07 LAB — CBC
HCT: 39.6 % (ref 36.0–46.0)
MCHC: 31.6 g/dL (ref 30.0–36.0)
MCV: 67.7 fL — ABNORMAL LOW (ref 78.0–100.0)
Platelets: 235 10*3/uL (ref 150–400)
RDW: 16.6 % — ABNORMAL HIGH (ref 11.5–15.5)

## 2010-10-07 LAB — URINE MICROSCOPIC-ADD ON

## 2010-10-07 LAB — DIFFERENTIAL
Basophils Relative: 0 % (ref 0–1)
Eosinophils Absolute: 0.1 10*3/uL (ref 0.0–0.7)
Eosinophils Relative: 1 % (ref 0–5)
Lymphs Abs: 1.3 10*3/uL (ref 0.7–4.0)
Neutrophils Relative %: 81 % — ABNORMAL HIGH (ref 43–77)

## 2010-10-07 LAB — GLUCOSE, CAPILLARY
Glucose-Capillary: 180 mg/dL — ABNORMAL HIGH (ref 70–99)
Glucose-Capillary: 221 mg/dL — ABNORMAL HIGH (ref 70–99)
Glucose-Capillary: 344 mg/dL — ABNORMAL HIGH (ref 70–99)

## 2010-10-07 LAB — URINALYSIS, ROUTINE W REFLEX MICROSCOPIC
Glucose, UA: >1000 mg/dL — AB
Hgb urine dipstick: NEGATIVE
Ketones, ur: NEGATIVE mg/dL
Leukocytes, UA: NEGATIVE
Protein, ur: NEGATIVE mg/dL
pH: 7 (ref 5.0–8.0)

## 2010-10-07 LAB — COMPREHENSIVE METABOLIC PANEL
Alkaline Phosphatase: 50 U/L (ref 39–117)
BUN: 13 mg/dL (ref 6–23)
Chloride: 105 mEq/L (ref 96–112)
Creatinine, Ser: 0.91 mg/dL (ref 0.4–1.2)
GFR calc non Af Amer: >60 mL/min (ref >60)
Glucose, Bld: 206 mg/dL — ABNORMAL HIGH (ref 70–99)
Potassium: 3.7 mEq/L (ref 3.5–5.1)
Total Bilirubin: 0.7 mg/dL (ref 0.3–1.2)

## 2010-10-07 LAB — URINE CULTURE

## 2010-10-20 ENCOUNTER — Encounter: Payer: Self-pay | Admitting: Internal Medicine

## 2010-10-21 ENCOUNTER — Encounter: Payer: Self-pay | Admitting: Internal Medicine

## 2010-10-24 ENCOUNTER — Other Ambulatory Visit: Payer: Self-pay | Admitting: *Deleted

## 2010-10-24 MED ORDER — B COMPLEX-C PO TABS: 1.0000 | ORAL_TABLET | Freq: Every day | ORAL | Status: DC

## 2010-10-24 NOTE — Telephone Encounter (Signed)
Call from [facility] at [facility]; states they have pt on Risperdal 1mg  instead of 3mg  - they need new Rx.  Also request Rx for B complex w/vitamin C which pt takes daily.   Need Rxs faxed to [facility] [phone]. She also stated pt has been refusing some of her medications; pt has refused x4 this month already; pt usually take her Insulin. [facility] # [phone].

## 2010-10-24 NOTE — Telephone Encounter (Signed)
Risperdal is prescribed by mental health - pls ask [facility] to contact them for the refill.  Filled the B complex - didn't know which pharmacy to send to so put as phone in. If you know the pharmacy, I can redo the Rx.

## 2010-10-25 NOTE — Telephone Encounter (Signed)
B Complex  Rx called to [facility] [phone].  [facility] made awared of rx and instructed to call Mental Health for refill on Risperdal per [name].

## 2010-10-31 LAB — URINALYSIS, ROUTINE W REFLEX MICROSCOPIC
Bilirubin Urine: NEGATIVE
Bilirubin Urine: NEGATIVE
Hgb urine dipstick: NEGATIVE
Leukocytes, UA: NEGATIVE
Nitrite: NEGATIVE
Protein, ur: NEGATIVE mg/dL
Specific Gravity, Urine: 1.025 (ref 1.005–1.030)
Specific Gravity, Urine: 1.027 (ref 1.005–1.030)
Urobilinogen, UA: 0.2 mg/dL (ref 0.0–1.0)
Urobilinogen, UA: 0.2 mg/dL (ref 0.0–1.0)
pH: 7 (ref 5.0–8.0)

## 2010-10-31 LAB — CBC
HCT: 35.4 % — ABNORMAL LOW (ref 36.0–46.0)
HCT: 37.9 % (ref 36.0–46.0)
Hemoglobin: 11.5 g/dL — ABNORMAL LOW (ref 12.0–15.0)
MCHC: 30.5 g/dL (ref 30.0–36.0)
MCHC: 30.8 g/dL (ref 30.0–36.0)
MCV: 65.7 fL — ABNORMAL LOW (ref 78.0–100.0)
MCV: 66.8 fL — ABNORMAL LOW (ref 78.0–100.0)
Platelets: 312 10*3/uL (ref 150–400)
Platelets: UNDETERMINED 10*3/uL (ref 150–400)
RBC: 6.15 MIL/uL — ABNORMAL HIGH (ref 3.87–5.11)
RDW: 16.4 % — ABNORMAL HIGH (ref 11.5–15.5)
WBC: 13.6 10*3/uL — ABNORMAL HIGH (ref 4.0–10.5)
WBC: 5.1 10*3/uL (ref 4.0–10.5)

## 2010-10-31 LAB — BASIC METABOLIC PANEL
BUN: 15 mg/dL (ref 6–23)
CO2: 25 mEq/L (ref 19–32)
Calcium: 8.6 mg/dL (ref 8.4–10.5)
Chloride: 106 mEq/L (ref 96–112)
Creatinine, Ser: 0.73 mg/dL (ref 0.4–1.2)
GFR calc Af Amer: >60 mL/min (ref >60)
GFR calc non Af Amer: >60 mL/min (ref >60)
Glucose, Bld: 250 mg/dL — ABNORMAL HIGH (ref 70–99)
Potassium: 4.6 mEq/L (ref 3.5–5.1)
Sodium: 134 mEq/L — ABNORMAL LOW (ref 135–145)

## 2010-10-31 LAB — RAPID URINE DRUG SCREEN, HOSP PERFORMED
Amphetamines: NOT DETECTED
Barbiturates: NOT DETECTED
Cocaine: POSITIVE — AB
Opiates: NOT DETECTED
Tetrahydrocannabinol: NOT DETECTED

## 2010-10-31 LAB — KETONES, URINE: Ketones, ur: 40 mg/dL — AB

## 2010-10-31 LAB — COMPREHENSIVE METABOLIC PANEL
ALT: 17 U/L (ref 0–35)
AST: 21 U/L (ref 0–37)
Albumin: 3.6 g/dL (ref 3.5–5.2)
CO2: 21 mEq/L (ref 19–32)
Calcium: 9 mg/dL (ref 8.4–10.5)
Creatinine, Ser: 0.8 mg/dL (ref 0.4–1.2)
GFR calc Af Amer: >60 mL/min (ref >60)
Sodium: 137 mEq/L (ref 135–145)
Total Protein: 7.5 g/dL (ref 6.0–8.3)

## 2010-10-31 LAB — GLUCOSE, CAPILLARY
Glucose-Capillary: 174 mg/dL — ABNORMAL HIGH (ref 70–99)
Glucose-Capillary: 192 mg/dL — ABNORMAL HIGH (ref 70–99)
Glucose-Capillary: 196 mg/dL — ABNORMAL HIGH (ref 70–99)
Glucose-Capillary: 201 mg/dL — ABNORMAL HIGH (ref 70–99)
Glucose-Capillary: 307 mg/dL — ABNORMAL HIGH (ref 70–99)
Glucose-Capillary: 453 mg/dL — ABNORMAL HIGH (ref 70–99)

## 2010-10-31 LAB — POCT PREGNANCY, URINE: Preg Test, Ur: NEGATIVE

## 2010-10-31 LAB — CARDIAC PANEL(CRET KIN+CKTOT+MB+TROPI)
Relative Index: 0.9 (ref 0.0–2.5)
Total CK: 159 U/L (ref 7–177)
Troponin I: <0.01 ng/mL (ref 0.00–0.06)
Troponin I: <0.01 ng/mL (ref 0.00–0.06)

## 2010-10-31 LAB — POCT I-STAT 3, ART BLOOD GAS (G3+)
Acid-base deficit: 3 mmol/L — ABNORMAL HIGH (ref 0.0–2.0)
O2 Saturation: 98 %
Patient temperature: 98.4

## 2010-10-31 LAB — URINE CULTURE
Colony Count: NO GROWTH
Culture: NO GROWTH

## 2010-10-31 LAB — DIFFERENTIAL
Basophils Absolute: 0 10*3/uL (ref 0.0–0.1)
Basophils Absolute: 0 10*3/uL (ref 0.0–0.1)
Basophils Relative: 0 % (ref 0–1)
Eosinophils Absolute: 0 10*3/uL (ref 0.0–0.7)
Eosinophils Relative: 0 % (ref 0–5)
Lymphocytes Relative: 9 % — ABNORMAL LOW (ref 12–46)
Lymphs Abs: 0.8 10*3/uL (ref 0.7–4.0)
Lymphs Abs: 1.2 10*3/uL (ref 0.7–4.0)
Monocytes Relative: 7 % (ref 3–12)
Neutro Abs: 11.4 10*3/uL — ABNORMAL HIGH (ref 1.7–7.7)
Neutro Abs: 4.4 10*3/uL (ref 1.7–7.7)

## 2010-10-31 LAB — POCT CARDIAC MARKERS
CKMB, poc: 1.6 ng/mL (ref 1.0–8.0)
Troponin i, poc: <0.05 ng/mL (ref 0.00–0.09)

## 2010-10-31 LAB — URINE MICROSCOPIC-ADD ON

## 2010-10-31 LAB — CULTURE, BLOOD (ROUTINE X 2)
Culture: NO GROWTH
Culture: NO GROWTH

## 2010-10-31 LAB — TSH: TSH: 2.997 u[IU]/mL (ref 0.350–4.500)

## 2010-10-31 LAB — AMMONIA: Ammonia: 49 umol/L — ABNORMAL HIGH (ref 11–35)

## 2010-10-31 LAB — ACETAMINOPHEN LEVEL: Acetaminophen (Tylenol), Serum: <10 ug/mL — ABNORMAL LOW (ref 10–30)
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
## 2010-11-29 NOTE — Consult Note (Signed)
[name], [name]               ACCOUNT NO.:  [id number]   MEDICAL RECORD NO.:  [id number]          PATIENT TYPE:  INP   LOCATION:  5031                         FACILITY:  [facility]   PHYSICIAN:  Antonietta Breach, M.D.  DATE OF BIRTH:  11/06/1968   DATE OF CONSULTATION:  06/03/2008  DATE OF DISCHARGE:  06/09/2008                                 CONSULTATION   REASON FOR CONSULTATION:  Psychosis.   REQUESTING PHYSICIAN:  [name], M.D.   HISTORY OF PRESENT ILLNESS:  [name] is a 45 year old female admitted  to the [facility] June 02, 2008 due to hypoglycemia.  [name]  has developed approximately 2 weeks of progressive agitation, looseness  of associations, hyperreligiosity, ideas of reference and impaired  judgment.  She does have intact memory and orientation.  Her  exacerbation of psychosis.  appears to be correlated with not taking her  psychotropic medication.  However, this cannot be confirmed.  Psychological and social efforts have not been associated with any  improvement in her psychiatric symptoms.  She has been complaining  outside of the hospital that somebody has been putting snakes in her  house which has no validity in reality.  She also has stated that a  snake is telling her to do things.  Her speech has been pressured.  She  has impaired judgment and poor insight.  The patient was making some  specific homicidal threats.   PAST PSYCHIATRIC HISTORY:  In review of the past medical record in June  2007, she was admitted to [facility] inpatient  psychiatric ward.  At that time, she was having hallucinations and  agitation.  She was also having flights of ideas and paranoia as well as  racing thoughts.  She was discharged on Invega as well as Cogentin.   FAMILY PSYCHIATRIC HISTORY:  None known.   SOCIAL HISTORY:  [name] is divorced.  She does have a remote history  of cocaine use as well as alcohol.  She does have a daughter.  Occupationally, she is not working and is disabled.   PAST MEDICAL HISTORY:  1. Diabetes mellitus type 1.  2. Hypoglycemia.   MEDICATIONS:  MAR is reviewed.  1. She is on a multivitamin daily.  2. She also is on thiamine 100 mg daily.   ALLERGIES:  NO KNOWN DRUG ALLERGIES.   LABORATORY DATA:  Sodium 136, BUN 12, creatinine 0.92, WBC 3.2,  hemoglobin 12, platelet count 347, SGOT 27, SGPT 14.  TSH is within  normal limits.  Magnesium is normal.   REVIEW OF SYSTEMS:  Constitutional, head, eyes, ears, nose, throat,  mouth, neurologic, psychiatric, cardiovascular, respiratory,  gastrointestinal, genitourinary, skin, musculoskeletal, hematologic,  lymphatic, endocrine, metabolic all unremarkable.   PHYSICAL EXAMINATION:  VITAL SIGNS:  Temperature 97.0, pulse 81,  respiratory rate 18, blood pressure 130/70, O2 saturation on room air  100%.  GENERAL APPEARANCE:  [name] is a middle-aged female who is pacing at  times and then is sitting on her bed at times.  She he is not displaying  any abnormal involuntary movements.  MENTAL STATUS EXAM:  [name] is alert.  However, her attention span  is decreased.  Concentration is decreased.  Her eye contact is  intermittent.  Her affect is mildly euphoric.  Her mood is agitated.  Her orientation is intact to all spheres.  Her memory is intact to  immediate recent and remote.  Her fund of knowledge and intelligence  appear to be grossly within normal limits.  However, she clearly is  displaying impaired judgment and has the thought abnormalities as below.  Her speech is pressured.  Thought process, she does have some looseness  of associations.  Thought content, there are ideas of reference, slight  echolalia and hyperreligiosity.  Insight is poor.  Judgment is impaired.   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  295.70,  schizoaffective disorder.  Remote history of cocaine use.  AXIS II:   Deferred.  AXIS III:  See past medical history above.  AXIS IV:  General medical.  AXIS V:  20.   [name] has severely impaired judgment due to her psychosis.  She  does not have the capacity for informed consent.  She would be at risk  for passive yet lethal self-neglect if she were to be residing outside  of an institution.   RECOMMENDATIONS:  1. Would start Risperdal at 1 mg p.o. q.6 p.m.  The liquid concentrate      can be utilized if needed to facilitate easy ingestion.  Would      increase her Risperdal to 2 mg q.p.m. by the end of the week.  The      dosages is started lower than average in order to reduce the risk      of rejection by the patient due to adverse effects.  2. Would use Cogentin 0.5 mg b.i.d. given the past history of needing      it.  3. Would continue to monitor her for stiffness or other extrapyramidal      side effects from the Risperdal.  4. Would admit to an inpatient psychiatric unit as soon as possible      once medically cleared.  5. [place] versus the [place] will need to be considered      regarding the patient's specific homicidal ideation as her course      progresses  6. Would continue low stimulation ego support.      Antonietta Breach, M.D.  Electronically Signed     JW/MEDQ  D:  06/12/2008  T:  06/13/2008  Job:  [id number]

## 2010-11-29 NOTE — Discharge Summary (Signed)
[name], ADNEY               ACCOUNT NO.:  [id number]   MEDICAL RECORD NO.:  [id number]          PATIENT TYPE:  IPS   LOCATION:  0406                          FACILITY:  [facility]   PHYSICIAN:  Anselm Jungling, MD  DATE OF BIRTH:  [birth year]   DATE OF ADMISSION:  06/09/2008  DATE OF DISCHARGE:  06/17/2008                               DISCHARGE SUMMARY   IDENTIFYING DATA/REASON FOR ADMISSION:  The patient is a 45 year old  African American female, her fourth or fifth [facility] admission.  She came to  [place] again with a history of mood disorder and cocaine abuse.  She was  initially admitted to the Internal Medicine Service on November 18 with  uncontrolled diabetes.  She was stabilized there, but while there, was  noted to have hyperreligious thoughts, pressured speech and flight of  ideas and was transferred to inpatient psychiatry for further  stabilization.  Please refer to the admission note for further details  pertaining to the symptoms, circumstances and history that led to her  hospitalization.  She was given an initial Axis I diagnosis of bipolar  disorder with psychosis, rule out mania, and cocaine abuse, rule out  dependence.   MEDICAL AND LABORATORY:  As above.  The patient came to [place] with a recent  history of urinary tract infection, resolved on antibiotics, history of  methicillin-resistant staphylococcus aureus skin lesions, history of  myocardial infarction at age 55, and an unconfirmed history of previous  syphilis.  She was medically and physically reviewed by the nurse  practitioner and followed throughout her inpatient stay.  She was  continued on a regimen of Lantus insulin, Glucophage, Protonix for GERD  symptoms, and multivitamins, with folic acid.  There were no acute  medical issues during her inpatient stay.   HOSPITAL COURSE:  The patient was admitted to the adult inpatient  psychiatric service.  She presented as a well-nourished, normally-  developed female  who was very pleasant, and mildly euphoric, with rapid  and hyperverbal speech that was pressured and characterized by impulsive  verbal outbursts, shouting praise for the Lord.  She was mildly  physically intrusive, with poor boundaries, but not aggressive or  irritable.  Insight was negligible.  Her judgment appeared to be  impaired.   She was treated with a regimen of Invega 6 mg q.h.s.  The patient showed  reasonable degree of improvement, and did not appear to require addition  of a mood stabilizing medication in addition to the [place].  She  participated in various therapeutic groups and activities.  She was  generally pleasant and cooperative throughout.  Her family was  contacted, and discharge and aftercare planning was coordinated with  them at the point that her family felt that she was back to her baseline  level of functioning, we prepared for her discharge, which occurred on  the ninth hospital day.  The patient agreed to following aftercare plan.   AFTERCARE:  The patient was to follow-[facility] at [facility] with an intake appointment on June 22, 2008 at 9:30 a.m.   DISCHARGE MEDICATIONS:  Invega 6 mg q.h.s., Cogentin 1 mg daily,  Glucophage 500 mg b.i.d., Protonix 40 mg daily, and insulin regimen.   DISCHARGE DIAGNOSES:  AXIS I:  Bipolar disorder NOS, history of cocaine  abuse.  AXIS II:  Deferred.  AXIS III:  History of insulin-dependent diabetes mellitus, GERD, recent  urinary tract infection.  AXIS IV: Stressors severe.  AXIS V: GAF on discharge 50.      Anselm Jungling, MD  Electronically Signed     SPB/MEDQ  D:  06/18/2008  T:  06/18/2008  Job:  [id number]

## 2010-11-29 NOTE — H&P (Signed)
NAMEZELPHIA, GLOVER               ACCOUNT NO.:  [id number]   MEDICAL RECORD NO.:  [id number]          PATIENT TYPE:  INP   LOCATION:  1235                         FACILITY:  [facility]   PHYSICIAN:  Della Goo, M.D. DATE OF BIRTH:  [birth year]   DATE OF ADMISSION:  11/23/2006  DATE OF DISCHARGE:                              HISTORY & PHYSICAL   PRIMARY CARE PHYSICIAN:  This is an unassigned patient.   CHIEF COMPLAINT:  Altered mental status.   HISTORY OF PRESENT ILLNESS:  This is a 45 year old female with type 1  diabetes mellitus.  Per report of the emergency medical services, family  reported the patient had been sick for about 2-3 days with nausea and  vomiting and was unable to hold down foods and liquids.  The patient was  brought to the emergency department, found to have an elevated glucose  level of 777 as well as subsequent acidosis with a bicarb level of 3.  The patient was unable to give any history, was confused and agitated.  The patient was started on therapy and referred for admission.   PAST MEDICAL HISTORY:  1. Multiple ER evaluations and hospital admissions.  2. History of type 1 diabetes mellitus.  3. Polysubstance abuse.  4. Bipolar disorder.  5. Seizure disorder.Marland Kitchen   MEDICATIONS:  The patient takes Lantus 45 units subcutaneously nightly.   ALLERGIES:  No known drug allergies.   SOCIAL HISTORY:  Lives with family.  History of polysubstance abuse.   FAMILY HISTORY:  Unable to obtain.   PHYSICAL EXAMINATION:  GENERAL:  This is a 45 year old obese female in  acute distress.  VITAL SIGNS: Initially temperature 97, blood pressure 138/71, heart rate  ranging from 72-126, respirations 24-30 and O2 saturations 92-100%.  HEENT: Normocephalic, atraumatic. No scleral icterus.  Pupils reactive  to light bilaterally. Extraocular muscles are intact.  Oropharynx has  dry oral mucosa.  NECK: Supple. Full range of motion.  No thyromegaly, adenopathy, jugular  venous  distension.  CARDIOVASCULAR: Tachycardiac rate and rhythm.  LUNGS: Shallow tachypneic respirations, decreased but clear.  ABDOMEN:  Positive bowel sounds, soft, nontender, nondistended.  EXTREMITIES: Without edema.  NEUROLOGIC:  The patient is obtunded.  She is able to move all four of  her extremities.   ASSESSMENT:  A 45 year old female being admitted with:  1. Diabetic ketoacidosis.  2. Severe metabolic acidosis.  3. Dehydration.  4. Mild hyponatremia.  5. Acute gastroenteritis.  6. History of bipolar.  7. Polysubstance abuse.  8. History of noncompliance with medical therapy.   PLAN:  The patient has been placed on the IV insulin drip protocol via  the Glucomander.  IV fluids have been ordered for rehydration therapy.  A PICC line has been requested, but multiple attempts have been made for  IV access and a PICC line without success.  Repeated attempts were able  to place a peripheral IV access.  A PICC line placement has been ordered  for later in the morning when the patient has received some hydration.  Her electrolytes will be monitored.  A BMET and phosphorus will be  ordered  q.4 h x3.  GI and DVT prophylaxes have also been ordered.  Diabetic education will be initiated once the patient is alert and out  of crisis.      Della Goo, M.D.  Electronically Signed     HJ/MEDQ  D:  11/24/2006  T:  11/25/2006  Job:  [id number]

## 2010-11-29 NOTE — Discharge Summary (Signed)
NAMEFILOMENA, [name]               ACCOUNT NO.:  [id number]   MEDICAL RECORD NO.:  [id number]          PATIENT TYPE:  OBV   LOCATION:  2039                         FACILITY:  [facility]   PHYSICIAN:  Cliffton Asters, M.D.    DATE OF BIRTH:  Jul 30, 1965   DATE OF ADMISSION:  07/28/2008  DATE OF DISCHARGE:  07/29/2008                               DISCHARGE SUMMARY   DISCHARGE DIAGNOSES:  1. Hypoglycemia and altered mental status, likely due to the overdose      of insulin.  2. Type 1 diabetes.  3. Bipolar disorder.  4. Cocaine abuse.  5. Hypertension.  6. Gastroesophageal reflux disease.  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  8. History of methicillin-resistant Staphylococcus aureus skin      infection in August 2004.  9. History of pancreatitis secondary to alcohol abuse in June 2002.  10.History of syphilis and gonorrhea.  11.History of hyperglycemia.  12.History of hypothyroidism.   DISCHARGE MEDICATIONS:  1. Hydroxyzine 10 mg q.8 h. p.r.n. for itching.  2. Lantus 15 units subcutaneous injection nightly.  3. Vitamin B1 100 mg p.o. daily.  4. Folic acid 1 mg p.o. daily.  5. Risperdal 1 mg p.o. nightly.  6. Benztropine 0.5 mg p.o. b.i.d.   DISPOSITION AND FOLLOWUP:  [name] was discharged from the hospital  on July 29, 2008, in a stable condition.  Her mental status has been  back to normal and her CBGs have been around 193-328, and she will have  appointment in [facility] with Dr. Gwenlyn Perking on  August 03, 2008, at 10 a.m.  At that time, we will review her CBG and  adjust her Lantus dosing and also provide diabetic education and may  refer to our diabetic specialist.   CONSULTATION:  None.   PROCEDURES PERFORMED:  Chest x-ray on July 28, 2008, there was no  evidence of acute cardiopulmonary disease.   ADMISSION HISTORY:  The patient is a 45 year old African American female  with past medical history of type 1 diabetes with multiple  episodes of  hypoglycemia or hyperglycemia, bipolar disorder, history of cocaine  abuse, and hypertension.  She was brought to the [facility] Emergency  Department because she was found to be unresponsive outside of [facility] and the EMS found her blood glucose was at 18, so the patient  was given glucagon and D50 and her blood sugar has been up to 35 and the  patient was brought to the ED.  In the ED, the patient have been unable  to tell why she became hypoglycemic. She said that her CBG was around  400 that morning, so she took regular insulin 22 units instead of her  usual dose of 8 units. Concerning with possible brain damage with  hypoglycemia, she was admitted to the hospital for observation in the  telemetry unit.   ADMISSION PHYSICAL EXAMINATION:  VITAL SIGNS:  Temperature 98.4, blood  pressure 155/98, pulse 96 with respirations 22, and oxygen saturation  100% on room air.  GENERAL:  The patient is somnolent.  EYES:  Pupils are equal and  reactive to light.  Extraocular movements  are intact.  NECK:  Supple.  No thyromegaly.  No JVD.  LUNGS:  Clear to auscultation bilaterally.  No wheezing.  No crackles.  HEART:  Regular rate and rhythm without murmur.  ABDOMEN:  Soft.  No tenderness.  Bowel sounds hypoactive.  NEURO:  Alert and oriented to person and place, but not to time.  Muscle  strength 5/5.   ADMISSION LABORATORY DATA:  White blood cells 13.6, hemoglobin 12.4.  Sodium 137, potassium 4, chloride 105, bicarb 21, BUN 15, creatinine  0.8, and glucose 75.  Liver function normal.  blood Ammonia 49; urine  glucose more than 1000 and protein 30; blood salicylate less than 4. A1C  10.4   HOSPITAL COURSE:  1. Hypoglycemia with altered mental status.  The patient was found to      unresponsive with a CBG of 18 and was brought to the emergency      department and was given glucagon and D50.  Her CBG has increased      up to 174-308 and also her mental status has gotten back  to her      normal status.  However, the patient has had previous episodes of      hypoglycemia.  The patient seems not to understand how to use      regular insulin properly, so based on the patient's report her      hypoglycemia is most likely due to the use of too much insulin.  On      discharge, the patient's mental status has been back to normal. We      have made an appointment at [facility] Clinic and also      removed the regular insulin and want her to use only Lantus. We      hope that this simple way may help to prevent further episode of      hypoglycemia.  2. Bipolar disorder.  During the hospitalization, the patient's mental      status has been back to normal and no suicidal ideation.  She will      continue to take her home medication after discharge.  3. Leukocytosis.  The patient's white blood cell was 13.6 on      admission, 5.1 on discharge.  The patient has no fever, this is      likely due to hypoglycemia response, not infections as blood and      urine culture negative and CXR negative.  4. Cocaine abuse.  We have provided the drug abuse counseling and the      patient would like to stop the cocaine abuse and her UDS is      positive for cocaine.   DISCHARGE VITAL SIGNS:  Temperature 98.2, blood pressure 113/72, pulse  91 with respiratory rate of 20, and oxygen saturation 100% on room air.   DISCHARGE LABORATORY DATA:  White blood cells 5.1, hemoglobin 9,  platelets 249. TSH 2.997      Jackson Latino, MD  Electronically Signed      Cliffton Asters, M.D.  Electronically Signed    ZY/MEDQ  D:  08/08/2008  T:  08/09/2008  Job:  [id number]   cc:   Outpatient Clinic

## 2010-11-29 NOTE — Discharge Summary (Signed)
NAMEJANNIFER, FISCHLER               ACCOUNT NO.:  [id number]   MEDICAL RECORD NO.:  [id number]          PATIENT TYPE:  INP   LOCATION:  5031                         FACILITY:  [facility]   PHYSICIAN:  [name], M.D.    DATE OF BIRTH:  11/06/1968   DATE OF ADMISSION:  06/02/2008  DATE OF DISCHARGE:  06/09/2008                               DISCHARGE SUMMARY   ADDENDUM:  The patient was transferred to [facility] for  further evaluation and management for her bipolar disorder.   DISCHARGE MEDICATIONS:  new changes   1. Insulin Humalog 8 units 15 minutes before each meal 3 times daily.  2. Lantus 15 units at bedtime.  3. Risperdal 2 mg once daily at bedtime.     Stop taking Cipro 250 mg tablets.   DISPOSITION:  The patient will follow up in outpatient clinic 2 weeks  after discharge from [facility]      Mliss Sax, MD  Electronically Signed      [name], M.D.  Electronically Signed    IM/MEDQ  D:  06/11/2008  T:  06/12/2008  Job:  [id number]

## 2010-11-29 NOTE — Discharge Summary (Signed)
[name], [name]               ACCOUNT NO.:  [id number]   MEDICAL RECORD NO.:  [id number]          PATIENT TYPE:  INP   LOCATION:  1235                         FACILITY:  [facility]   PHYSICIAN:  Hillery Aldo, M.D.   DATE OF BIRTH:  [birth year]   DATE OF ADMISSION:  11/23/2006  DATE OF DISCHARGE:  11/27/2006                               DISCHARGE SUMMARY   PRIMARY CARE PHYSICIAN:  The outpatient clinic at [facility].   DISCHARGE DIAGNOSES:  1. Diabetic ketoacidosis, resolved.  2. Type 1 diabetes.  3. Dehydration.  4. Acute renal failure.  5. Polysubstance abuse.  6. Anemia of chronic disease, status post iron and B12/folate studies.  7. Leukocytosis, resolved, culture data negative.  8. Bipolar disorder.  9. Hypokalemia, resolved.   DISCHARGE MEDICATIONS:  1. Lantus 45 units subcutaneously nightly.  2. NovoLog insulin 3 units q.a.c.   CONSULTATIONS:  None.   BRIEF ADMISSION HISTORY OF PRESENT ILLNESS:  The patient is a 45-year-  old female with past medical history of brittle diabetes and medical non-  adherence.  She does not regularly follow up with a primary care  physician and has had multiple admissions for diabetic ketoacidosis in  the past.  She has poor health literacy and has been intermittently  homeless.  Nevertheless, she presented on the date of admission with  nausea and vomiting for 2-3 days prior to presentation.  Upon evaluation  in the emergency department, she was found to be in diabetic  ketoacidosis with a bicarbonate level of 3.  She was admitted for  further evaluation and treatment.   PROCEDURES AND DIAGNOSTIC STUDIES:  1. Chest x-ray on Nov 23, 2006 showed no acute cardiopulmonary disease.  2. Status post peripherally inserted central catheter placement on Nov 24, 2006.  Postprocedure chest x-ray showed the peripherally      inserted central catheter line in the lower superior vena cava.      There were diffuse parenchymal opacities  bilaterally.  3.  Chest x-      ray on Nov 25, 2006 showed improving aeration.   DISCHARGE LABORATORY VALUES:  Sodium was 138, potassium 4.7, chloride  116, bicarb 20, BUN 5, creatinine 0.73, glucose 323.  CBC showed a white  blood cell count of 7.2, hemoglobin 9.1, hematocrit 28.9, platelets  178,000.   HOSPITAL COURSE:  PROBLEM #1 - DIABETIC KETOACIDOSIS:  The patient was  aggressively hydrated with normal saline and when her glucose levels  fell to less than 200, her IV fluids were changed to D5 normal saline.  She was put on Glucommander protocol with IV insulin until her acidosis  resolved and her anion gap closed.  She did have a few episodes of  hypoglycemia thought to be secondary to resuming her home dose of  insulin when she was not taking in adequate p.o.'s; this resolved with  continuation of fluids containing dextrose and her improvement in p.o.  intake.  Her Lantus dose was temporarily decreased to 10 units while her  p.o. intake was inadequate.  At this point, she is medically  stable,  alert, eating, and her sugars have come up quite a bit.  We will  therefore discharge her on her usual home dose of Lantus and NovoLog at  3 units with meals.  She has a history of noncompliance in the past and  would likely not be compliant or understands how to do sliding-scale  insulin.  Although this regimen is suboptimal, it may be our best option  for this patient.  She should follow up at the outpatient clinic.   PROBLEM #2 - DEHYDRATION WITH ACUTE RENAL FAILURE:  This resolved with  vigorous fluid rehydration.   PROBLEM #3 - POLYSUBSTANCE ABUSE:  The patient has a known history of  polysubstance abuse.  She was supplemented with thiamine and folic acid  while in the hospital.  She will be referred to [facility] for further  management.   PROBLEM #4 - NORMOCYTIC ANEMIA:  Iron studies as well as B12 and folate  studies were done.  Her iron was 108 and her total iron binding capacity   was low at 173, consistent with anemia of chronic disease.  Her B12 and  folate levels were within normal limits.   PROBLEM #5 - LEUKOCYTOSIS:  The patient's leukocytosis resolved on  empiric treatment with Zosyn.  She was transitioned over to p.o. Avelox.  All culture data were negative and with her improving chest x-ray and no  significant symptoms, antibiotics will be discontinued on discharge.   PROBLEM #6 - BIPOLAR DISORDER:  The patient does not take medications  regularly for this.  She was given Ativan p.r.n. while in the hospital  and was appropriate.  She should follow up with mental health services  if needed.   PROBLEM #7 - HYPOKALEMIA:  The patient was appropriately repleted.   DISPOSITION:  The patient is medically stable for discharge home.  We  will obtain a social worker consult to provide the patient with  transportation and to provide her with the numbers for [facility] and mental  health for further followup.      Hillery Aldo, M.D.  Electronically Signed     CR/MEDQ  D:  11/27/2006  T:  11/27/2006  Job:  [id number]   cc:   [facility]
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
## 2010-11-29 NOTE — Discharge Summary (Signed)
[name], [name]               ACCOUNT NO.:  [id number]   MEDICAL RECORD NO.:  [id number]           PATIENT TYPE:   LOCATION:                                 FACILITY:   PHYSICIAN:  Alvester Morin, M.D.       DATE OF BIRTH:   DATE OF ADMISSION:  03/27/2008  DATE OF DISCHARGE:  03/28/2008                               DISCHARGE SUMMARY   ADMISSION DIAGNOSES:  1. Hyperglycemia.  2. Polyuria.  3. Polysubstance abuse.   DISCHARGE MEDICATIONS:  1. NovoLog 5 units 15 minutes before each meal.  2. Lantus 25 units before dinner.   DISPOSITION AND FOLLOWUP:  To follow up at the Outpatient Clinic in 1  week with Dr. Radonna Ricker   PROCEDURES PERFORMED:  None.   CONSULTATIONS:  None.   ADMITTING HISTORY AND PHYSICAL:  A 45 year old female with past medical  history of type 1 diabetes mellitus presented to the Outpatient Clinic  for routine care.  Her CBG was noted to be more than 600.  She reported  that her blood sugar recently has been ranging at home from 100 to 565.  She also complained of polyuria and polydipsia.  She had no complaints  of fevers, chills, nausea, vomiting, diarrhea, abdominal pain, dysuria,  headache, chest pain, or shortness of breath.  She had difficulty  maintaining her blood sugars including very low blood sugars (as low as  30) noted in the clinic before.   PHYSICAL EXAMINATION:  VITAL SIGNS:  Temperature 97, blood pressure  118/66, pulse 89, respiratory rate 18, and O2 saturation 98% on room  air.  GENERAL:  In no acute distress.  EYES:  Anicteric.  EOMI.  ENT:  No oropharyngeal exudates.  NECK:  Supple.  No lymphadenopathy.  No JVD.  No bruit.  RESPIRATION:  Bilateral air entry normal.  Normal effort and no  wheezing.  CARDIOVASCULAR:  Regular rate and rhythm.  Normal heart sounds.  No  murmur, no rub or regurgitation.  GI:  Soft, nontender, and nondistended abdomen.  Scar in right lower  quadrant.  Positive bowel sounds normal.  EXTREMITIES:  No  abnormality noted.  SKIN:  Dry and warm.  LYMPH:  No cervical lymphadenopathy.  MUSCULOSKELETAL:  Strength 5/5.  NEUROLOGIC:  Grossly intact.  No focal deficit.  PSYCHIATRIC:  Normal mood.  Labs  blood glucose more than 600 on admission.  It was difficult to obtain  venous sample for BMET analysis. Sample was obtained arterially after  treatment with 5 units of plain insulin.  Sodium 130, potassium 3.8, chloride 99, bicarbonate 23, BUN 16,  creatinine 0.85, and glucose of 275.  Anion gap of 8.  Hemoglobin of  11.6, hematocrit of 36.5, white blood cell count of 6.6, and platelet of  327.   HOSPITAL COURSE BY PROBLEM:  1. Hyperglycemia.  The patient had CBGs that were above 600 range in      the Outpatient Clinic.  The patient was provided 5 units of regular      insulin.  The patient dropped her blood sugar on CBGs at 250.  The  patient was admitted to exclude the possibility of hyperosmolar non-      ketotic acidosis or diabetic ketoacidosis.  Arterial blood gases      and metabolic profile were obtained.  It did not reveal any anion      gap.  There were no signs of acidosis.  The patient was      aggressively hydrated.  The patient's blood sugar normalized over a      period of next 12 hours.  The patient's last blood sugar in the      morning of March 28, 2008, was 150.  The patient was discharged      home with clear indications of using Lantus unit total 25 units      before dinner and to take asparted insulin 5 units before each      meal.  The patient will follow up at the Outpatient Clinic for      continuation of care.  2. Polyurea secondary to problem #1.  The patient improved as her      blood glucose level normalized.  No polyurea was reported at the      time of discharge.  3. Polysustance abuse: Patient left the hospital before counseling was      available.      Clerance Lav, MD  Electronically Signed      Alvester Morin, M.D.  Electronically  Signed    RS/MEDQ  D:  04/10/2008  T:  04/11/2008  Job:  [id number]   cc:   [facility]

## 2010-11-29 NOTE — Consult Note (Signed)
NAMEMAYRE, BURY               ACCOUNT NO.:  [id number]   MEDICAL RECORD NO.:  [id number]          PATIENT TYPE:  INP   LOCATION:  5031                         FACILITY:  [facility]   PHYSICIAN:  Antonietta Breach, M.D.  DATE OF BIRTH:  11/06/1968   DATE OF CONSULTATION:  06/05/2008  DATE OF DISCHARGE:                                 CONSULTATION   Ms. Gieske continues with very tangential thought process, increased  energy, ideas of reference and impaired judgment.  She is not combative.  She is redirectable.  Her orientation and memory function are intact.   REVIEW OF SYSTEMS:  No stiffness or other adverse extrapyramidal side  effects from the Risperdal.   LABORATORY DATA:  Sodium 136, BUN 12, creatinine 0.92, WBC 3.2,  hemoglobin 12, platelet count 347.  TSH is normal.   PHYSICAL EXAMINATION:  VITAL SIGNS:  Temperature 97.6, pulse 103,  respiratory rate 20, blood pressure 134/87, O2 saturation on room air  97%.  GENERAL APPEARANCE:  Ms. Fennimore does not have any stiffness from the  Risperdal.   MENTAL STATUS EXAMINATION:  She is alert.  Her attention span is  decreased.  She is distracted by her psychotic content.  Her  concentration is decreased.  Her eye contact is intermittent.  Her  affect is expansive.  Her mood is anxious.  She is oriented to all  spheres.  Speech is mildly pressured.  There is no dysarthria.  Thought  process:  There is alogia and mildly increased speech.  There is alogia,  some looseness of association.  Thought content:  Ideas of reference and  hyper-religiosity.  Insight is poor.  Judgment is impaired.   ASSESSMENT:  Axis I:  293.81.  Psychotic disorder, not otherwise  specified.   Ms. Posada does have impaired judgment secondary to her severe  psychosis.  Therefore, she would be at risk for passive and yet  potentially lethal self-neglect outside of institutional care.   RECOMMENDATIONS:  1. Therefore, the undersigned recommends that she be  admitted to an      inpatient psychiatric unit for further evaluation and treatment.  2. Would continue her Risperdal trial for antipsychosis, monitoring      for stiffness or other extrapyramidal side effects.  3. The dosage has been started and maintained at a relatively low dose      initially in order to not generate side effects resulting in the      patient rejecting the medication.  4. Would continue with low stimulation ego support.      Antonietta Breach, M.D.  Electronically Signed    JW/MEDQ  D:  06/09/2008  T:  06/09/2008  Job:  [id number]
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
## 2010-12-02 NOTE — Discharge Summary (Signed)
[place]  Patient:    [name], [name]                       MRN: [id number] Adm. Date:  [id number] Disc. Date: [id number] Attending:  Levy Sjogren Dictator:   Bernerd Pho, M.D. CCDala Dock (fax # [phone]), Cleophas Dunker   Discharge Summary  DISCHARGE DIAGNOSES: 1. Diabetic ketoacidosis secondary to medical noncompliance and substance    abuse. 2. Iron deficiency anemia.  DISCHARGE MEDICATIONS:  Insulin 20 units of NPH in the morning and 10 units of Regular in the morning (the patient is given a one-month supply of insulin in hand on discharge).  FOLLOW-UP:  Dr. Audria Nine at [facility] on Monday, October 15, 2000, at 10:45 a.m.  CONSULTATIONS:  None.  PROCEDURES:  None.  CHIEF COMPLAINT:  Nausea, vomiting, and decreased mental status.  HISTORY OF PRESENT ILLNESS:  The patient is a 45 year old African-American female with history of insulin-dependent diabetes mellitus, who presents with a two-day history of nausea, vomiting, and abdominal pain.  The patient was brought to the emergency department by EMS, who found her with decreased mental status at home but was oriented x 4.  The patient in the emergency department was too sedated to give any history other than that she uses insulin and has diabetes for approximately 17 years.  The patient acknowledges fever, chills, cough, dysuria, abdominal pain.  The patient also states she has been on a 36-hour alcohol binge and used crack yesterday.  The patient states she has not taken her insulin in two days.  The patient states she is not pregnant.  Otherwise, history is unobtainable.  No old charts available for review.  PAST MEDICAL HISTORY: 1. Insulin-dependent diabetes mellitus for approximately 17 years, according    to the patient.  The patient states she has been admitted before for    similar symptoms (DKA). 2. Polysubstance abuse.  ADMISSION MEDICATIONS:  Insulin.   The patient states she is on 20 units of NPH and 10 units of Regular in the morning.  ALLERGIES:  No known drug allergies.  FAMILY HISTORY:  Unobtainable.  SOCIAL HISTORY:  Unobtainable.  REVIEW OF SYSTEMS:  Unobtainable.  PHYSICAL EXAMINATION:  VITAL SIGNS:  Blood pressure 162/78, heart rate 114, respiratory rate 22, temperature 98.1.  GENERAL:  In no apparent distress.  Sedated.  Oriented to person but not to place and is combative, not cooperative with the exam.  HEENT:  Sclerae, conjunctivae, cornea clear.  Oropharynx not visualized secondary to lack of cooperation.  CARDIOVASCULAR:  Tachycardic, with regular rhythm and a 2/6 systolic ejection murmur.  LUNGS:  Clear to auscultation bilaterally.  ABDOMEN:  Decreased bowel sounds but soft, nondistended, and slight epigastric tenderness to deep palpation.  EXTREMITIES:  No clubbing, cyanosis, or edema.  NEUROLOGIC:  Sleepy and combative.  Moves all extremities.  Otherwise nonfocal.  ADMISSION LABORATORY DATA:  Chest x-ray:  No active disease.  EKG shows sinus tachycardia with right atrial enlargement and deep T-waves.  Admission electrolytes:  Sodium 134, potassium 6.1, chloride 104, bicarbonate 11, BUN 20, creatinine 1.2, and glucose of 424.  White count 14.6, hemoglobin 13, platelets 438, with an MCV of 66 and an RDW of 17.  Urine drug screen positive for cocaine.  Alcohol level less than 10.  Beta hCG negative. Magnesium 2.3, phosphorus 5.2.  UA negative for nitrite or leukocytes but greater than 80 ketones.  Urine  microscopy 0-5 white blood cells and red blood cells, with rare bacteria.  Admission ABG revealed pH 7.19, pCO2 of 18, pO2 of 120, bicarbonate 7.  ABG result states that the patient was on room air at the time of it being drawn.  HOSPITAL COURSE: #1 - DIABETIC KETOACIDOSIS:  The patient was admitted and started on an insulin drip at 5 units an hour.  The patient was aggressively hydrated with normal  saline initially, which was switched to D-5 normal saline when the patients CBGs were less than 250.  The patients CBGs were monitored hourly, and the patients electrolyte panel was monitored every two to four hours. The patients management was confounded by lack of cooperation with blood draws.  The patient was maintained on an insulin drip for roughly 14 hours, which was then discontinued when the patients anion gap closed on two consecutive electrolyte panels.  The patient was given her usual 20 units of NPH and 10 units of Regular subcutaneously 20 minutes before the insulin drip was stopped.  By hospital day #2 the patient felt at baseline, and the patient was discharged on hospital day #3.  Because of noncompliance the patients insulin regimen was not changed during this admission.  To simplify compliance, the patients regimen is on a q.d. basis with 20 units of NPH in the morning and 10 units of Regular in the morning.  Although the patient has Medicaid, the patient was given a one-month supply of insulin with syringes on discharge.  The patient was also given a prescription for a three-month supply of insulin with syringes.  The patient is strongly encouraged to take her insulin on a daily basis and to follow up with her primary care Tremaine Fuhriman.  #2 - POLYSUBSTANCE ABUSE:  The patient was seen by her social worker and was offered help with cocaine and alcohol cessation.  The patient mentioned interest in cessation on discharge.  #3 - IRON DEFICIENCY ANEMIA:  After hydration the patients hemoglobin fell to 11, with an MCV of 65.7 and an RDW of 16.1.  The patient had iron studies done, which were remarkable for a ferritin of 21, an iron of 97, a TIBC of 294, and a percent saturation of 33.  Because of the relatively low ferritin, it was thought that the patient might benefit from further workup of her iron deficiency.  The likely cause of iron deficiency in this female who is  still  menstruating is blood loss secondary to menstruation.  However, colorectal cancer screening should be considered in this patient, which could be done as an outpatient.  DISPOSITION:  The patient is being discharged to home.DD:  09/30/00 TD:  09/30/00 Job: [id number]

## 2010-12-02 NOTE — Consult Note (Signed)
[name], [name]               ACCOUNT NO.:  [id number]   MEDICAL RECORD NO.:  [id number]          PATIENT TYPE:  IPS   LOCATION:  0406                          [facility]ACILITY[facility]  [facility]   PHYSICIAN:  Antonietta Breach, M.D.  DATE OF BIRTH:  09/30/1965   DATE OF CONSULTATION:  DATE OF DISCHARGE:  06/17/2008                                 CONSULTATION   Ms. Courser is having pressured speech.  Her thought process is very  tangential.  She is pacing in the room.  She has impaired judgment.  She  is having ideas of reference with also echolalia.   She is clearly showing impaired judgment as well as poor insight. She is  still on her Risperdal without any response yet.   PHYSICAL EXAMINATION:  NEUROLOGIC:  She is not having any stiffness or  other extrapyramidal side effects with the Risperdal.   MENTAL STATUS EXAM:  Ms. Lepage is alert.  She is oriented to all  spheres.  Thought process as above.  Affect is agitated. Mood is  agitated.  Thought content as above.  Insight is poor. Judgment is  impaired. Speech is pressured. Eye contact is intermittent.   ASSESSMENT:  AXIS I:  293.82 Psychotic disorder, not otherwise  specified.  293.83 Mood disorder, not otherwise specified.   Ms. Guier is at risk for passive and yet lethal self-neglect if outside  of an institution due to her psychosis and secondary impaired judgment.   RECOMMENDATIONS:  Would increase her Risperdal to 2 mg q.h.s. and would  continue to monitor for stiffness or other extrapyramidal side effects.   Would utilize Ativan 0.5 to 2 mg p.o., IM, or IV q.4h. p.r.n. agitation.   Would admit to an inpatient psychiatric ward as soon as possible.   Would continue to provide low stimulation ego support.      Antonietta Breach, M.D.  Electronically Signed     JW/MEDQ  D:  06/28/2008  T:  06/28/2008  Job:  [id number]

## 2010-12-02 NOTE — H&P (Signed)
[name], [name]               ACCOUNT NO.:  [id number]   MEDICAL RECORD NO.:  [id number]          PATIENT TYPE:  INP   LOCATION:  5023                         FACILITY:  [facility]   PHYSICIAN:  Lonia Blood, M.D.      DATE OF BIRTH:  08-11-1965   DATE OF ADMISSION:  10/14/2004  DATE OF DISCHARGE:                                HISTORY & PHYSICAL   PRIMARY CARE PHYSICIAN:  [facility], so patient is unassigned.   PRESENTING COMPLAINT:  Low blood sugar.   HISTORY OF PRESENT ILLNESS:  This patient is a bounce back. She is a 45-year-  old black female with multiple medical records. The patient was discharged  from the hospital today after admission from the March 26 with hypoglycemia.  She and her husband are homeless, and on the way to get their homeless  shelter back, patient apparently was in the bus for over an hour. By the  time they arrived there, she was feeling weak, as her sugars were dropping.  She had lunch in the hospital prior to leaving but had some insulin because  her sugars were in the 500s in the hospital. By the time they arrived at the  [facility], they were closed, so they returned to the  hospital, and her sugars were found to be 20, hence this admission. Please  refer to dictated history and physical on March 26 for more details.   PAST MEDICAL HISTORY:  1.  Mainly diabetes.  2.  Homelessness.  3.  Medication noncompliance.  4.  History of glaucoma.  5.  Hypertension.   ALLERGIES:  The patient has no known drug allergies.   SOCIAL HISTORY:  As indicated, lives in the shelter with husband currently.  Still trying to back into the shelter which will not be feasible until  probably Monday.   FAMILY HISTORY:  Essentially nonsignificant at this time.   REVIEW OF SYSTEMS:  Also mainly as in HPI. All major systems reviewed.   PHYSICAL EXAMINATION:  VITAL SIGNS:  Temperature 97.2, blood pressure  122/75, pulse 98, respiratory rate 18,  saturations 100% on room air.  GENERAL:  The patient is lying in bed in no acute distress.  HEENT:  PERRLA. EOMI.  NECK:  Supple. No jugular venous distention. No lymphadenopathy.  CARDIOVASCULAR:  Regular rate and rhythm.  ABDOMEN:  Soft, nontender with positive bowel sounds.  EXTREMITIES:  Showed no edema, cyanosis, or clubbing.   LABORATORY DATA:  Currently pending.   ASSESSMENT:  This is a 45 year old African-American female that is a bounce  back to our service secondary to hypoglycemia. There is a strong chance that  this is surrepticious since patient missed the department of social service  today, and there is no where else to go. The patient and husband assist they  are afraid because they will not be able to get any place until probably  Monday. The patient also is known to use drugs as well. Her hypoglycemia  could also be due to the Novolog insulin she received in the hospital,  especially since the patient has no  source of food as an outpatient, and  that may be the case. She has not had any meals, and she has taken insulin  and that drops her sugar. The plan therefore will be to keep her in the  hospital and observe her.  I am more inclined to starting Lantus on her once  a day with or without the sliding scale insulin. Maybe compare oral  hyperglycemic in addition to the Lantus and see if that keeps patient out of  trouble. All of that will only be feasible if patient has a standard place  to live, and that will not be feasible until Monday. Otherwise, she will  kept coming back to the hospital with the same problems. We will therefore  continue with her observation. I will restart her on Lantus for tonight and  adjust it accordingly and then will continue sliding scale insulin.      LG/MEDQ  D:  10/15/2004  T:  10/15/2004  Job:  [id number]

## 2010-12-02 NOTE — Discharge Summary (Signed)
[place]  Patient:    [name], [name]                      MRN: [id number] Adm. Date:  [id number] Disc. Date: [id number] Attending:  [name] Dictator:   Duncan Dull, M.D. CC:         [facility]   Discharge Summary  DATE OF BIRTH:  July 16, 1966  DISCHARGE DIAGNOSES: 1. Diabetic ketoacidosis, resolved. 2. Alcoholic pancreatitis, resolved. 3. Insulin-dependent diabetes mellitus, poorly controlled. 4. Polysubstance abuse. 5. History of recent spontaneous miscarriage at approximately 12 weeks. 6. History of recently treated syphilis and gonorrhea. 7. History of incarceration in November of 2001.  DISCHARGE MEDICATIONS: 1. NPH Insulin 25 units subcu q.a.m. and 10 units subcu q.h.s. 2. Regular Insulin 10 units subcu q.a.m. and 10 units subcu q.h.s.  DISPOSITION:  The patient is to follow up with her primary care doctor at [facility] early next week.  She is to check her CBGs q.a.c. and q.h.s. until she sees her regular doctor.  She has been the information and phone number for contacting [facility] for her polysubstance abuse.  PROCEDURE:  Complete abdominal ultrasound on June 10; no evidence of gallstones, gallbladder wall thickening, or biliary ductal dilatation.  Liver is within normal limits.  Pancreas is unremarkable.  Portable chest x-ray on June 10; lungs are clear of acute disease and heart size is normal.  HISTORY OF PRESENT ILLNESS:  The patient is a 45 year old African-American female with a history of multiple hospitalizations for DKA, pancreatitis, and ongoing alcohol abuse, and recent cocaine abuse, who presents on June 10 with a three-day history of abdominal pain with nausea and emesis with multiple episodes of emesis on the days prior to admission.  She reports that her abdominal pain began three days ago after an alcohol binge.  Her last alcohol was four days ago.  She consumes several 40 ounce beers per  day.  Her pain was accompanied by nausea with emesis, but was not accompanied by fever, chills, diarrhea, or hematemesis.  Despite her anorexia, she states that she was taking her insulin up until the day prior to admission when she ran out. She reports generalized weakness, but no tremors or withdrawal symptoms.  Also notable in her history was that she apparently miscarried somewhere between one to three weeks ago of a 12-week fetus and was evaluated at [facility] with a pelvic examination.  She has a follow-up appointment scheduled with OB/GYN.  PAST MEDICAL HISTORY:  IDDM with diabetic complications including hearing loss and vision loss.  Pancreatitis.  Cocaine abuse.  Alcohol abuse.  Status post appendectomy.  Recent treatment for syphilis and gonorrhea.  ALLERGIES:  No known drug allergies.  MEDICATIONS:  NPH and Regular Insulin DD:  12/27/00 TD:  12/27/00 Job: [id number]

## 2010-12-02 NOTE — Op Note (Signed)
   [name], [name]                         ACCOUNT NO.:  [id number]   MEDICAL RECORD NO.:  [id number]                   PATIENT TYPE:  INP   LOCATION:  0477                                 FACILITY:  [facility]   PHYSICIAN:  Currie Paris, M.D.           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   OPERATION:  I&D of three superficial abscesses with punch biopsy (x 3) of  one lesion.   SURGEON:  Currie Paris, M.D.   ANESTHESIA:  Local.   CLINICAL HISTORY:  This patient has developed multiple skin lesions,  consistent possibly with pyoderma gangrenosum.  Several of them had some  epidermolysis with what looked like some superficial purulent material.  ID  had requested biopsy for pyoderma gangrenosum.  In addition, these needed to  be opened up for drainage.   DESCRIPTION OF PROCEDURE:  In the patient's room, she was counseled about  what we had planned on and was agreeable.  The area was prepped with  Betadine and three areas were anesthetized with a little bit of Xylocaine.  I unroofed three areas.  In one of them, I took punch biopsies of the area  to send tissue both for pathology and cultures.  This area was packed, and  local wound care will go from here.                                               Currie Paris, M.D.    CJS/MEDQ  D:  04/04/2003  T:  04/04/2003  Job:  [id number]
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
## 2010-12-02 NOTE — Discharge Summary (Signed)
[place]  Patient:    [name], [name]                      MRN: [id number] Adm. Date:  [id number] Disc. Date: [id number] Attending:  [name] Dictator:   Duncan Dull, M.D.                           Discharge Summary  DATE OF BIRTH:  03-03-1966  DISCHARGE DIAGNOSES: 1. Diabetic ketoacidosis. 2. Pancreatitis. 3. History of cocaine abuse. 4. Alcohol abuse. 5. Insulin-dependent diabetes mellitus, poorly controlled, status post    spontaneous miscarriage one week ago. 6. 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  DISCHARGE MEDICATIONS: 1. NPH Insulin 25 units subcu q.a.m. and 10 units subcu q.h.s. 2. Insulin Regular 10 units subcu q.a.m. and 10 units subcu q.p.m.  The patient is to check her blood sugars q.a.c. and q.h.s. until she follows up with her regular doctor at [facility].  She was also instructed to avoid alcohol and cocaine as the alcohol was the precipitant for her pancreatitis.  Additionally she was instructed to practice safe sex everytime she has intercourse as she has a history of unprotected sex with multiple partners.  HISTORY OF PRESENT ILLNESS:  [name] is a 45 year old African-American female with a history of IDDM with diabetic complications, cocaine abuse, alcohol abuse, and pancreatitis, who presented on June 10 with abdominal pain which began three days prior to admission after having an alcohol binge where she was consuming several 40 ounce beers daily.  Her pain was accompanied by nausea with emesis x 3 to 4 daily.  She states that she continued taking her insulin for the first two days of her abdominal pain despite anorexia, but ran out of insulin on the day prior to admission.  She denies hematemesis, fever, chills, or diarrhea.  She reports generalized weakness, no tremors or withdrawals.  The last alcoholic beverage was four days prior to admission. Additionally she states that  approximately one week ago, she miscarried at approximately [redacted] weeks gestation and was evaluated by [facility] ER with a pelvic examination and has a follow-up appointment scheduled with [facility].  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  Pancreatitis.  Cocaine use.  Alcohol abuse.  Status post appendectomy.  ALLERGIES:  No known drug allergies.  MEDICATIONS:  Regular and NPH Insulin 25 of NPH and 10 units of Regular in the morning and 10 units of each in the evening subcu.  FAMILY HISTORY:  Positive for diabetes mellitus and coronary artery disease. She has a twin brother who is medically compliant with his diabetic illness. She lives in [place] with the father of two of her children.  She has a total of five children who live with her and has been recently incarcerated. She is disabled and is on Medicaid and has two minor children in her home. She has had frequent follow-up at alcohol and drug services under commitment in the past and goes weekly to [facility] diabetic classes.  She has also been treated for syphilis and gonorrhea in the last few months.  REVIEW OF SYSTEMS:  As above.  PHYSICAL EXAMINATION:  GENERAL: The patient is a well-nourished middle-aged female in mild distress secondary to abdominal pain.  VITAL SIGNS:  Temperature 98, blood pressure 137/71, pulse 100, respiratory rate 16, saturating 98% on room air.  HEENT: Anisocoria with the right pupil minimally reactive and small. Extraocular muscles are intact.  Oropharynx is benign without erythema or exudate.  NECK: Supple with no lymphadenopathy.  LUNGS: Clear to auscultation bilaterally with no wheezing and good air movement.  HEART: Tachy but regular rhythm with no murmurs, rubs, or gallops.  ABDOMEN: Soft, diffusely tender with  increased tenderness at the midepigastric region.  Bowel sounds present, but hypoactive.  There is no guarding or rebound. EXTREMITIES: Without clubbing, cyanosis, or edema.  Pulses are 2+ bilaterally. RECTAL: Hemoccult negative with stool in the vault.  NEUROLOGICAL: Nonfocal with the exception of decreased hearing in the left ear and decreased vision bilaterally.  LABORATORY DATA:  Lipase 392, sodium 132, potassium 5.3, chloride 106, bicarb 11, BUN 18, creatinine 1.3, glucose 330.  Serum acetone is positive small amount.  Urine beta hCG is negative, urine drug screen is negative.  LFT showed a total protein of 9.2, albumin 4.6, AST 13, ALT 15, alkaline phosphatase 76, and total bilirubin of 1.5.  HOSPITAL COURSE:  #1 - Diabetic ketoacidosis evidenced by her anion gap acidosis, positive serum ketone test, and negative lactate acid test.  The patient was admitted to stepdown unit for IV hydration and management of DKA.  She was started on insulin drip and monitored q.1h. for CBGs and q.3h. for basic metabolic panel. The patients anion gap was closed within 24 hours.  However, her bicarb remained below 20 for some time, so she was continued on an insulin drip. However, the patient began refusing to have her blood draws.  Because she was stable at this point and her anion gap remained closed, the insulin drip was discontinued and she was given sliding scale insulin with q.2h. CBGs.  By day #3 of admission her acidosis resolved with a bicarb of 23 and she was given regular dose insulin and was discharged to home on her regular insulin regimen.  She was given diabetes counseling, although, she has received this multiple times in the past.  Additionally, she was given a new glucometer as she requested one.  She receives her medications through [facility] and she is on Medicaid, therefore, she was not elligible for any further assistance with her medications.  She was instructed to continue  checking her blood sugars q.a.c. and q.h.s. until she follows up with her regular doctor at [facility] early next week.   #2 - Pancreatitis.  The patients abdominal pain was secondary to acute pancreatitis.  She was made NPO for the first 24 hours of admission.  Her pain resolved overnight and by 48 hours was tolerating full liquid diet without abdominal pain or nausea.  She was advanced to full diet on day #3 of hospitalization and tolerated this well.  #3 - Polysubstance abuse.  The patient was requesting to be referred for rehabilitation.  However, she has had repeated referrals to [facility] and was given this information and told that it would be up to her to make the appointment for [facility] referral.  #4 - DD:  12/27/00 TD:  12/27/00 Job: [id number]

## 2010-12-02 NOTE — H&P (Signed)
NAMECHRISTABELL, [name]                         ACCOUNT NO.:  [id number]   MEDICAL RECORD NO.:  [id number]                   PATIENT TYPE:  INP   LOCATION:  0477                                 FACILITY:  [facility]   PHYSICIAN:  Sherin Quarry, MD                   DATE OF BIRTH:  1966/03/23   DATE OF ADMISSION:  04/02/2003  DATE OF DISCHARGE:                                HISTORY & PHYSICAL   HISTORY OF PRESENT ILLNESS:  [name] is an unfortunate 45 year old lady  with a history of type 1 diabetes dating to age [age].  She has had very  numerous hospitalizations for a variety of problems in the recent past.  These have included hospitalizations for diabetic ketoacidosis which in the  past has been attributed to pancreatitis secondary to alcohol abuse.  She  has also been seen on numerous occasions over the last year for abscesses  which have been present in the buttock area, leg area, neck area, and back  area.  These have been documented to be secondary to methicillin-resistant  Staphylococcus.  In September 2003, and echocardiogram was done which  apparently was normal.  More recently, a transesophageal echocardiogram was  apparently done by Dr. Fraser Din, but I cannot find the result of this test.  In August, possibly in association with an abscess on her neck, blood  cultures were obtained which were positive for a methicillin-resistant  Staphylococcus.  As best as I can determine, the patient's last medical  admission was in May.  She was also admitted to the psychiatric service in  July.  Since leaving the hospital, she has alternated between being homeless  and living with her brother.  She has continued to abuse both alcohol and  crack cocaine.  She comes in today because of profound pain in her legs, and  has multiple subcutaneous abscesses on her legs which have the appearance of  possibly being septic in origin.  She indicates that she has had  intermittent fever.  She states  that she has not been experiencing any  nausea or vomiting.  She is admitted at this time for evaluation of multiple  subcutaneous abscesses, and what is probably poorly controlled diabetes.   MEDICATIONS:  The patient is probably not taking any medicines regularly  except for insulin.  She states that she is taking 25 units of NPH and 12  units of regular insulin in the morning, 16 units of NPH and 10 units of  regular insulin in the evening.  When she was last discharged from the  hospital apparently the intention was that she take Zoloft 50 mg daily,  Risperdal 0.25 mg at bedtime, Synthroid 25 mcg daily, Protonix 40 mg daily,  Reglan 5 mg b.i.d.  But, as noted above, she is not actually taking any of  these medications.   MEDICAL ILLNESSES:  1. Type 1 diabetes dating  to age [age].  2. History of multiple hospitalizations for diabetic ketoacidosis attributed     on various occasions to pancreatitis, alcohol abuse, and cocaine abuse.  3. History of anemia.  4. History of previous ear surgery.  5. History of venereal disease.  6. History of multiple abscesses.  7. History of appendectomy.  8. Record indicates that the patient had a MI at age 65.  11. History of seizures.  10.      History of psychotic disorder.   OPERATIONS:  The patient has had multiple abscesses drained.   FAMILY HISTORY:  Mother has a history of thyroid disease.  She has a brother  who died of uncertain causes.   SOCIAL HISTORY:  The patient has five children of whom I think her mother  takes care of.  She drinks three to four beers per day and uses crack  cocaine on a regular basis.  She denies injection of intravenous drugs.  She  says that she is sometimes homeless and sometimes lives with her brother.   REVIEW OF SYSTEMS:  Otherwise negative.  The patient denies any recent  history of headache, breathing difficulty, chest pain, nausea, or vomiting.   PHYSICAL EXAMINATION:  GENERAL:  She is a chronically  ill-appearing woman  who appears older then her stated age.  HEENT:  Within normal limits.  CHEST:  Clear.  CARDIOVASCULAR:  Regular rate and rhythm.  There are no murmurs, rubs, or  gallops.  ABDOMEN:  Benign.  There are normal bowel sounds without masses, tenderness,  or organomegaly.  NEUROLOGIC:  Within normal limits.  EXTREMITIES:  Multiple circular subcutaneous abscesses with surrounding  cellulitis consistent with multiple abscesses possibly of bacteremic origin.   IMPRESSION:  1. Multiple subcutaneous abscesses.  2. Uncontrolled diabetes.  3. Hypothyroidism.  4. History of alcohol and crack cocaine usage.  5. Gastroesophageal reflux disease.  6. History of vomiting.  7. History of venereal disease.  8. History of psychotic disorder.   PLAN:  We will admit the patient to the hospital for further evaluation and  treatment of these problems.                                               Sherin Quarry, MD    SY/MEDQ  D:  04/02/2003  T:  04/03/2003  Job:  [id number]

## 2010-12-02 NOTE — Discharge Summary (Signed)
[name], [name]               ACCOUNT NO.:  [id number]   MEDICAL RECORD NO.:  [id number]          PATIENT TYPE:  INP   LOCATION:  5023                         FACILITY:  [facility]   PHYSICIAN:  Hettie Holstein, D.O.    DATE OF BIRTH:  October 20, 1965   DATE OF ADMISSION:  10/14/2004  DATE OF DISCHARGE:                                 DISCHARGE SUMMARY   Please note that [name] frequently presents with different names and has  multiple different medical record numbers in our system. This most recent  admission involved a readmission after discharge in which she had an  alternate spelling of her name - Pinnix - and first name [name], medical  record [phone]. There are multiple spellings and medical record numbers  with [name].   PRIMARY CARE Jhada Risk:  She has none. She uses the [facility]gent [facility] of [facility]. In addition, she is being arranged for [facility] and has a follow-up  appointment today following discharge.   ADMISSION DIAGNOSIS:  Hypoglycemic event.   DISCHARGE DIAGNOSES:  1.  Hypoglycemic event following discharge secondary to poor oral intake      following discharge and continued regular insulin. At this time, in      review of [name] frequent hospitalizations and presentations for      hypoglycemia and her instability and poor compliance, Lantus is being      recommended as one shot q.h.s. regimen until she can achieve some      continuous medical care in the outpatient setting where she can      implement tighter glycemic control with sliding scale. However, at      present I do not feel that [name] can optimally manage her blood      glucose in this tight manner with respect to her social situation.      Therefore, Lantus is being prescribed at 28 units q.h.s. and she is      being asked to check her CBGs q.a.m. and I recommended to seek      continuous medical care in the outpatient setting to achieve better      glucose control.  2.  Status post  incision and drainage of small sore on her fourth digit of      her left hand. Incision and drainage by Dr. Metro Kung this admission      with recommendations for saline soaks daily until completely healed.   MEDICATIONS ON DISCHARGE:  1.  Lantus 28 units q.h.s.  2.  Doxycycline 100 mg p.o. b.i.d. x10 days to completion. She is counseled      to abstain from sexual intercourse during her treatment course as this      medication can adversely affect pregnancy. She is counseled to at the      very least use contraceptive measures during the course of her      antibiotic therapy.   Of note, [name] continues on her admissions to claim that she is  pregnant. However, multiple urine pregnancy screens are and have been  negative. However, it is recommended  with any future presentations that she  undergo urine pregnancy screening as she has conveyed that her and her  partner are trying to have children.   DISPOSITION:  [name] is being discharged in medically stable condition.  Arrangements are being made through case management to assist in her final  disposition.      ESS/MEDQ  D:  10/17/2004  T:  10/17/2004  Job:  [id number]   cc:   Dala Dock
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
## 2010-12-02 NOTE — Discharge Summary (Signed)
[facility], [place]  Patient:    [name], [name] Visit Number: [id number] MRN: [id number]          Service Type: MED Location: [id number] Attending Physician:  McDiarmid, Leighton Roach. Dictated by:   Rich Number, M.D. Admit Date:  01/17/2002 Discharge Date: 01/20/2002   CC:         [facility]   Discharge Summary  DATE OF BIRTH: September 07, 1965  DISCHARGE DIAGNOSES: 1. Diabetic ketoacidosis secondary to: 2. Diabetes mellitus type 1 times 27 years. 3. Otitis externa of the left ear. 4. Pelvic inflammatory disease.  DISCHARGE MEDICATIONS: 1. Doxycycline 100 mg one tablet p.o. b.i.d. for pelvic inflammatory disease    times ten days. 2. Azithromycin 500 mg p.o. q.d. for three days for pelvic inflammatory    disease and externa otitis. 3. Regular insulin 10 units in the a.m. and 10 units at night before meals. 4. NPH 20 units in the morning and 10 units at night.  The patient was also instructed to use the glucometer that she was given upon discharge.  DIET: Low carbohydrate diet, low sugar diet.  SPECIAL INSTRUCTIONS:  She is restricted to not having intercourse with her husband until she and her husband have taken their antibiotics for probable GC chlamydia and Trich infection.  HOSPITAL COURSE: The patient is a 45 year old African-American female with diabetes mellitus type 1 times 27 years. She came in because she said she felt dehydrated.  She had recently run out of her medications right before July 4 and was unable to get to the pharmacy at [facility] because it was closed on July 4.  She admits that she has been taking her brothers insulin, but was unable to locate him and thus had not taken several of her doses.  She was last seen at [facility] last month. During her course here she was placed on an insulin drip, was given plenty of fluids and continued to improve over the couple of days that she was  here.  The patient had the following problems as described below:  #1 - DIABETIC KETOACIDOSIS:  The patient was treated with an insulin drip and fluids and placed back on her regular home regimen as well as being placed on a sliding scale during her stay here.  The patient was encouraged to maintain her home regimen for her insulin dosing.  She was also given a glucometer upon discharge to use at home to help keep an eye on her glucose levels.  #2 - DIABETES MELLITUS:  The patient is set to follow-up with [facility] in approximately two weeks to continue working on monitoring her glucose levels and keeping a tight regimen.  The patient may need some further diabetes education.  #3 - OTITIS EXTERNA OF THE LEFT EAR:  The case was very mild. Obviously the patient is a diabetic and it was felt that antibiotic drops would not be as effective as a systemic p.o. drug.  Thus, the patient was placed on Azithromycin 500 mg p.o. q.d. for three days.  The patient will follow-up at [facility] to have her ear rechecked.  #4 - PELVIC INFLAMMATORY DISEASE:  The patient during her stay here was started on doxycycline 100 mg p.o. b.i.d. as well as cefotetan 2 gram injection that was given.  She was discharged on doxycycline 100 mg one tablet p.o. b.i.d. for approximately ten more days of treatment for a total of 14 days.  She was also placed  on Azithromycin not only for the otitis externa but also for the PID, Azithromycin 500 mg for three days.  The patient was also given an appointment at the gynecology clinic on July 17 at 1:15 p.m. to be further evaluated.  She should have received her antibiotic treatment at that time.  The patient also had a pelvic ultrasound on January 20, 2002 revealing a thickened, irregular endometrium that was suspicious for retained products of conception versus endometritis. It seems that it is most likely that the patient has endometritis, but will be followed up in the  gynecology clinic. The patient was nonreactive for GC and chlamydia.  She was positive for Trichomonas on wet prep.  Urine pregnancy was negative.  #5 - COCAINE ABUSE:  The patient was encouraged to seek some assistance with her drug abuse.  The patient was not interested in doing so at this time. Dictated by:   Rich Number, M.D. Attending Physician:  McDiarmid, Tawanna Cooler D. DD:  01/20/02 TD:  01/22/02 Job: [id number]

## 2010-12-02 NOTE — Discharge Summary (Signed)
[name], [name]                         ACCOUNT NO.:  [id number]   MEDICAL RECORD NO.:  [id number]                   PATIENT TYPE:  INP   LOCATION:  5005                                 FACILITY:  [facility]   PHYSICIAN:  Rosalyn Gess. Norins, M.D. [facility]         DATE OF BIRTH:  1965/08/06   DATE OF ADMISSION:  07/05/2002  DATE OF DISCHARGE:  07/08/2002                                 DISCHARGE SUMMARY   ADMITTING DIAGNOSIS:  Diabetic ketoacidosis.   DISCHARGE DIAGNOSES:  1. Diabetic ketoacidosis, resolved.  2. Untreated abscesses.  3. Cocaine addiction.   DISCHARGE MEDICATIONS:  1. Augmentin 875 mg 1 p.o. b.i.d. for seven more days.  2. NPH 28 units 7 a.m. and 5 p.m.  3. Regular 5 units 7 a.m. and 5 p.m.   LABORATORY DATA:  Significant laboratories during admission:  CBC on 12/22  was WBC 4.1, H&H 9.1 and 29.5, MCV 63.8, platelet count 408.  BMET on 12/21:  Sodium 130, potassium 3.4, glucose 237, BUN 12, creatinine 0.8, calcium 7.4.  Drug screen positive for cocaine.   DISCHARGE DESTINATION:  Home.   HISTORY OF CURRENT ADMISSION:  The patient was recently discharged from  [facility] on 06/30/02 to behavioral health and then once again  discharged home.  She states that she had not taken her insulin during her  hospitalization at behavioral health or at home, and presented to the  emergency room with a blood glucose 9763.  She was started on the DKA  protocol, receiving IV fluids and Glucommander protocol.  In addition,  Social Services and case management were consulted to seek their assistance  in planning for the patient's equipment and medications upon discharge.  She  was counseled that she much make her medications a priority when she  receives her monthly financial assistance.  A Glucometer has been acquired  for her through [facility].  She has been taught how to use the machine.  She is  being sent home with the insulin that has been provided here at the  hospital.   An appointment has been set up for her today at [facility] so  that she may get her antibiotics.  The plan has been reviewed with the  patient and she states she understands.  She was off the Glucommander by  12/21 and experienced three episodes of blood sugars in the 400s.  If was  felt that this was in part due to the additional food that were being  brought into her room from her friend and family members.  She may have  eaten some of this food therefore compromising her blood glucose level.  The  need to maintain a diabetic diet and regimen were reviewed with her by  several care providers.  The patient was stable for discharge on 07/07/02  but again experienced a blood glucose of 456 whereupon it was decided to  keep her overnight, manage her  blood sugars, and see how she does in the  morning.   PHYSICAL EXAMINATION:  GENERAL:  At this point the patient was awake, alert,  cheerful, expecting to go home today.  She is awake, alert and oriented x 3.  VITAL SIGNS:  Her vital signs are 97.9, 80, 20, 113/59, 100% on room air.  Her CBG is now 150.  They drifted downward nicely from 494 with one episode  of a 41, however, she has currently been stable now for several hours now.  HEART:  Regular rate and rhythm.  LUNGS:  Clear to auscultation.  EXTREMITIES:  No edema.  SKIN:  Several slowly healing lesions over her buttocks.  Overall she is  stable for discharge.   PROBLEMS:  Diabetes mellitus, status post diabetic ketoacidosis.  Home  health care services have been set up by the case manager including a  monitor and she has been counseled regarding buying her medications at the  beginning of the month when her check arrives.  She has been set up for an  appointment with [facility] to follow up on her antibiotics.  Unfortunately we do not feel that the patient is receptive to cease her  cocaine addition.  She states that she and her spouse relapsed together and  overall it is a very  sad social situation.     Governor Specking, N.P. [facility]             Rosalyn Gess. Norins, M.D. [facility]    TJ/MEDQ  D:  07/08/2002  T:  07/09/2002  Job:  [id number]

## 2010-12-02 NOTE — Discharge Summary (Signed)
[name], [name]                         ACCOUNT NO.:  [id number]   MEDICAL RECORD NO.:  [id number]                   PATIENT TYPE:  PS   LOCATION:  0403                                 FACILITY:  [facility]   PHYSICIAN:  Haskel Khan, M.D.           DATE OF BIRTH:  January 05, 1966   DATE OF ADMISSION:  01/04/2002  DATE OF DISCHARGE:  01/11/2002                                 DISCHARGE SUMMARY   IDENTIFYING DATA:  This is a 45 year old African-American female, with  history of chronic psychotic disorder, who reported drinking two 40-ounce  beers per day and using cocaine daily, reporting hearing voices and, upon  admission, mood instability.   MEDICATIONS:  Regular insulin and NPH and K-Dur.   ALLERGIES:  No known drug allergies.   PHYSICAL EXAMINATION:  Essentially within normal limits.  Neurologically  nonfocal.   LABORATORY DATA:  Routine admission labs essentially within normal limits  including a thyroid panel with a TSH 4.6.  Sodium was mildly low at 131.   MENTAL STATUS EXAM:  Friendly and cooperative African-American female  casually dressed.  Speech was somewhat repetitive, making noises with the  tongue.  Soft and slow.  Mood depressed.  Affect restricted.  Thought  processes goal directed.  Thought content positive for seeing shadows and  bugs.  The patient denied suicidal or homicidal ideation.  Cognitively  intact.  Some perseveration.  Judgment and insight were poor.   ADMISSION DIAGNOSES:   AXIS I:  1. Psychotic disorder not otherwise specified.  2. Alcohol dependence.  3. Cocaine dependence.   AXIS II:  None.   AXIS III:  1. Diabetes mellitus, type 1.  2. Thalassemia by history.   AXIS IV:  Moderate (problems with primary support group).   AXIS V:  20/50.   HOSPITAL COURSE:  The patient was admitted and ordered routine p.r.n.  medications, underwent monitoring and medical stabilization.  The patient's  glucose was monitored.  She was placed on a  Librium detox protocol for safe  withdrawal and Risperdal to stabilize psychotic symptoms.  The patient was  given sliding scale insulin for insulin coverage.  The patient tolerated  medication changes and participated in individual, group and milieu  treatment, reporting a positive response without side effects to  medications.   CONDITION ON DISCHARGE:  Improved.  Mood was more euthymic.  Affect less  labile.  Thought processes goal directed.  Thought content negative for  dangerous ideation.  The patient denied acute psychotic symptoms and  reported motivation to be compliant with the follow-up plan.   DISCHARGE MEDICATIONS:  1. K-Dur 20 mEq q.a.m.  2. Iron q.a.m.  3. Risperdal 0.5 mg b.i.d.  4. Insulin NPH 28 units q.a.m. and 12 units at 5 p.m.  5. Regular insulin 14 units q.a.m. and 15 units at 5 p.m.   FOLLOW UP:  [facility].   DISCHARGE DIAGNOSES:  AXIS I:  1. Psychotic disorder not otherwise specified.  2. Alcohol dependence.  3. Cocaine dependence.   AXIS II:  None.   AXIS III:  1. Diabetes mellitus, type 1.  2. Thalassemia by history.   AXIS IV:  Moderate (problems with primary support group).   AXIS V:  Global Assessment of Functioning on discharge 50.                                                Haskel Khan, M.D.    Lovie Macadamia  D:  02/19/2002  T:  02/23/2002  Job:  [id number]

## 2010-12-02 NOTE — Discharge Summary (Signed)
[name], [name]                         ACCOUNT NO.:  [id number]   MEDICAL RECORD NO.:  [id number]                   PATIENT TYPE:  INP   LOCATION:  5506                                 FACILITY:  [facility]   PHYSICIAN:  Nani Gasser, M.D.            DATE OF BIRTH:  June 21, 1966   DATE OF ADMISSION:  10/15/2002  DATE OF DISCHARGE:  10/16/2002                                 DISCHARGE SUMMARY   DISCHARGE DIAGNOSES:  1. Diabetic ketoacidosis.  2. Diabetes mellitus type 1.  3. Euthyroid.  4. Microcytic anemia secondary to chronic disease.  5. Polysubstance abuse.  6. Left ear pain.  7. Vaginal itching.   DISCHARGE MEDICATIONS:  1. Insulin NPH 40 units subcutaneous q.a.m. and subcutaneous h.s.  2. Iron sulfate 325 mg one p.o. t.i.d.   HISTORY OF PRESENT ILLNESS:  Please see history and physical dictation.   HOSPITAL COURSE:  The patient was admitted overnight for diabetic  ketoacidosis and left the facility AMA on October 16, 2002.  The following  problems were addressed:  1. Diabetic ketoacidosis.  The patient was given a bolus of IV fluids,     placed on fluids overnight and placed on an insulin drip.  The patient     was started on subcutaneous insulin and insulin drip was stopped.  The     patient left AMA before being able to get prescriptions for more insulin.  2. Diabetes mellitus type 1.  The patient has a long-standing history of     recurrent episodes of DKA and has been poorly controlled with a     hemoglobin A1c of 10.8.  3. Microcytic anemia.  The patient's hemoglobin was stable on admission at     12.4.  4. Polysubstance abuse.  The patient did have a negative urine drug screen     on admission, but she did report had recently been on an alcohol and     crack cocaine binge.  The patient did not exhibit any withdrawal symptoms     while here.  5. Tobacco abuse.  The patient seems to be interested in a cessation     program.  6. Left ear pain.  This was a  complaint on the day the patient left AMA and     will need to be further investigated.  7. Vaginal itching.  This was also mentioned on the day the patient left AMA     and was unable to be further investigated with a pelvic exam.  Would also     recommend future HIV testing for this patient.  8. History of hypothyroidism. TSH was checked here which was normal and the     patient is currently not on any medications for her thyroid.  Thus, it is     felt that she does not need to be restarted on any medications at this     time.   DISCHARGE  LABORATORIES:  Hemoglobin A1c was 10.8.  TSH was 1.519.                                               Nani Gasser, M.D.   CM/MEDQ  D:  10/16/2002  T:  10/18/2002  Job:  [id number]

## 2010-12-02 NOTE — H&P (Signed)
[name], [name]                         ACCOUNT NO.:  [id number]   MEDICAL RECORD NO.:  [id number]                   PATIENT TYPE:  INP   LOCATION:  3113                                 FACILITY:  [facility]   PHYSICIAN:  Georgina Quint. Plotnikov, M.D. [facility]      DATE OF BIRTH:  October 02, 1965   DATE OF ADMISSION:  06/24/2002  DATE OF DISCHARGE:                                HISTORY & PHYSICAL   CHIEF COMPLAINT:  Weak; pain, right buttock.   HISTORY OF PRESENT ILLNESS:  This patient is a 45 year old female with  uncontrolled type 1 diabetes who has been sick for four days with fever,  weakness, and increasing number of boils. Presented to the ER with CBG  greater than 600. She was assessed to be in DKA.   PAST MEDICAL HISTORY:  Type 1 diabetes, alcohol abuse, cocaine abuse,  anemia.   MEDICATIONS:  1. Insulin regular 10 units twice daily, NPH 20 units in the morning and 10     units in the evening.  2. Ferrous sulfate.  3. Potassium chloride.   ALLERGIES:  None.   FAMILY HISTORY:  Positive for diabetes.   SOCIAL HISTORY:  She lives with boyfriend. She has five daughters. She is a  smoker, drinks five beers a day, and uses cocaine when available.   REVIEW OF SYMPTOMS:  Has been having cough with rare sputum production,  fever, weakness, decreased oral intake. Denies chest pain or shortness of  breath. No vomiting either. Pain in extremities as described above. The rest  is negative.   PHYSICAL EXAMINATION:  VITAL SIGNS:  Temperature 101.7, blood pressure  95/56, heart rate 100, respirations 22. She is in no acute distress, looks  tired.  HEENT:  With dry oral mucosa. Eyes:  With full range of motion, pupils  reactive.  NECK:  Supple. No meningeal signs.  LUNGS:  Clear. No wheezes. No dullness to percussion.  HEART:  S1 and S2, tachycardia, no enlargement to percussion.  ABDOMEN:  Soft, nontender, no masses felt. Somewhat sensitive in the lower  half.  EXTREMITIES:  Lower  extremities with peripheral pulse normal.  Her gross  muscle strength normal. She has a large 5 x 4 cm abscess, firm, on the right  buttock.  No evident drainage; 1.5 cm abscess over the left knee cap; 1 cm  abscess over her mons pubis more to the right from the midline.   LABORATORY DATA:  White count 9.7, hemoglobin 10.0. UA:  0 to 2 WBCs, 0 to 2  RBCs, glucose in the urine greater than 1,000. Creatinine 0.9, BUN 9,  potassium 5.6. ABG:  pH 7.49, pCO2 21.5, bicarb 16, base excess -5.   ASSESSMENT/PLAN:  1. Multiple abscesses; right buttock, mons pubis, left knee. Obtain blood     cultures, start Unasyn IV, surgical consultation in the morning regarding     buttock abscess.  2. Compensated diabetic ketoacidosis/uncontrolled type 1 diabetes. Start  insulin drip, IV fluids, watch electrolytes.  3. Polysubstance abuse. Will treat with Librium for DT prophylaxis.  4. Dehydration. IV fluids.  5. Bronchitis. Plan as above. Get a chest x-ray.  6. Delirium tremens prophylaxis with Librium, thiamine IV.                                               Georgina Quint. Plotnikov, M.D. [facility]    AVP/MEDQ  D:  06/25/2002  T:  06/25/2002  Job:  [id number]   cc:   Dala Dock

## 2010-12-02 NOTE — Discharge Summary (Signed)
NAMEKAYTELYNN, SCRIPTER                         ACCOUNT NO.:  [id number]   MEDICAL RECORD NO.:  [id number]                   PATIENT TYPE:  INP   LOCATION:  3033                                 FACILITY:  [facility]   PHYSICIAN:  Alvester Morin, M.D.               DATE OF BIRTH:  04-Oct-1965   DATE OF ADMISSION:  08/30/2003  DATE OF DISCHARGE:  09/02/2003                                 DISCHARGE SUMMARY   DISCHARGE DIAGNOSES:  1. Altered mental status.  2. Polysubstance abuse.  3. Hypothyroidism.  4. Diabetes mellitus.  5. Microcytic anemia.  6. Hypokalemia.   DISCHARGE MEDICATIONS:  1. NPH insulin 15 units q.a.m. and 10 units q.h.s.  2. Synthroid 25 mcg p.o. daily.  3. Thiamine 100 mg p.o. daily.  4. Folic acid 1 mg p.o. daily.  5. Multivitamins 1 tablet p.o. daily.   DISPOSITION:  The patient was given followup appointment with Duke Salvia at  [facility] on February 22 at 3:30 p.m.   PROCEDURES:  1. Portable chest x-ray on August 30, 2003.  Impression:  No active     cardiopulmonary process demonstrated.  2. Head CT performed on August 30, 2003.  Impression: Stable, unremarkable     CT of the head, no acute findings.   BRIEF HISTORY AND PHYSICAL:  Ms. An is a 45 year old African-American  female with past medical history of polysubstance abuse, hypothyroidism,  diabetes mellitus, chronic anemia (thalassemic), and depression, history of  auditory hallucinations, presents to the ED with altered mental status.  She  was found outside her apartment babbling, then started shaking.  She was  brought to the ED very combative, restrained by the ED staff, and given  Ativan 2 mg IV.   PHYSICAL EXAMINATION:  VITAL SIGNS:  EMS vital signs were heart rate 140,  CBG 100. After sedation with Ativan, vital signs showed heart rate 94, blood  pressure 148/176, temperature 98.7, respiratory rate 24, O2 saturation 100%  on room air.  HEENT:  The patient's physical exam was  significant for poor dentition.  LUNGS:  Clear to auscultation bilaterally.  CARDIAC:  She was found to be tachycardic but with a regular rhythm and no  appreciable murmur.  ABDOMEN:  She had positive bowel sounds.  Her abdomen was soft and nontender  and nondistended.  EXTREMITIES:  She had 2+ dorsalis pedis pulses and no ankle edema.   LABORATORY DATA:  She was found to have an ethanol level of less than 5, but  she was cocaine positive.  CMP:  Sodium 137, potassium 3.6, chloride 103,  bicarb 23, BUN 7, creatinine 0.6, glucose 174.  Anion gap 11.  Bilirubin  0.3, AST 26, ALT 10, alkaline phosphatase 68, direct bilirubin less than  0.1, indirect bilirubin not calculable.  Glycosylated hemoglobin 11.5.  CBC:  White blood count 6.1, RBC 5.51, hemoglobin 11.1, hematocrit 35.3, MCV 64.3,  MCHC 31.3, RDW 15.5,  platelets 385, ANC 4.9 with 81% neutrophils.   She was admitted with altered mental status.   HOSPITAL COURSE:  #1.  ALTERED MENTAL STATUS:  Differential diagnosis for her altered mental  status included DKA, acute intoxication (cocaine?), withdrawal (ethanol?),  neurological dysfunction (CVA, seizure, trauma, infection such as  neurosyphilis?), sequelae of MI, PE, electrolyte disturbance.  Given  patient's history of substance abuse including heavy alcohol abuse (now with  ethanol negative), it was thought that her altered mental status was likely  secondary to ethanol withdrawal or acute intoxication with another illicit  substance.  It was felt it was unlikely to be DKA given the patient's  electrolytes and her glucose of 174.  The patient had a nonfocal  neurological exam which made stroke less likely, although it did not  completely rule out a TIA (very unlikely given the patient's age).  Furthermore, the patient denied any recent head trauma and had a negative  head CT.  Her chest x-ray was also normal.  Further workup included RPR to  rule out contribution of syphilis, TSH.   The patient was treated with IV  thiamine and folate. The patient's TSH was found to be significantly  elevated at 5.955.  Her acute hepatitis panel was negative.  Her HIV screen  was nonreactive.  Other toxicology screen was unremarkable including  acetaminophen less than 10.  The rest of the lab work was unremarkable.  On  the second day of admission, the patient had markedly improved.  She was  answering questions appropriately, asking for room service.  She was sleepy,  likely secondary to continued Ativan.  The plan at that time was to  discontinue the Ativan if patient did not have evidence of delirium tremens  and she would be discharged to home.  By February 15, her altered mental  status had resolved, and she was back to baseline, alert and oriented x 4  with no focal neurological abnormality.  On February 15, psych consult was  obtained with Dr. [name].  It was found that she would be a candidate for  inpatient treatment of her substance abuse.  However, per discussions with  care management, the patient is not a candidate; however, the option was  discussed with her to go to [facility] to be evaluated.  Give that the patient's altered mental status had resolved, it was felt she  was safe to be released to go home, and she was discharged with followup by  [facility].   #2.  POLYSUBSTANCE ABUSE:  The patient was found to be cocaine positive.  Also, she was known to be a heavy alcohol user and admitted this.  She was,  however, not found to have high levels of ethanol in her blood (ethanol  level less than 5).  As such, it was felt that her altered mental status  could be secondary to alcohol withdrawal as was treated as reported in  Problem #1.  The patient was told to abstain from alcohol, tobacco, and  other illicit drug use.  The risks of continued use were described and included death. The patient stated that she understood the risks of  continued use  and the benefits of abstinence and said that she would stop  using alcohol, tobacco, and other illicit drugs.  She also agreed to follow  up with Narcotics Anonymous and Alcoholics Anonymous, and to [facility].   #3.  HYPOTHYROIDISM:  The patient's TSH  wsz elevated, and it was determined  that her hypothyroidism was uncontrolled.  This was thought to be secondary  to noncompliance wither her medications, and she was restarted on her home  dose of Synthroid 25 mcg per day. She was discharged with followup at [facility] to further evaluate this problem and to manage her hypothyroidism.   #4.  DIABETES MELLITUS:  The patient was found to have significantly  elevated hemoglobin A1C suggesting noncompliance with her diabetes regimen.  She was restarted on her home dose of NPH 15 units q.a.m. and 10 units  q.p.m. with sliding scale insulin.  Her blood sugars were relatively well  controlled during her hospitalization, and she was discharged with NPH 15  units q.a.m. and 10 units q.p.m. and followup with her primary care  physician at [facility] to further manage her diabetes.   #5.  MICROCYTIC ANEMIA:  The patient was admitted with slightly depressed  hemoglobin at approximately 11.  This was thought to be likely secondary to  menstruation and given her know history of thalassemia.  She was discharged  with a stable hemoglobin with followup at [facility] to further evaluate  her microcytic anemia.   DISCHARGE LABORATORY DATA:  CBC:  WBC 4.1, RBC 5.09, hemoglobin 10.1,  hematocrit 32.7, MCV 64.2, MCHC 30.9, RDW 15.2, platelets 342.  BMP: Sodium  138, potassium 4.3, chloride 109, CO2 25, glucose 91, BUN 10, creatinine  0.8, calcium 8.4.      Tawny Asal, MD                   Alvester Morin, M.D.    CW/MEDQ  D:  11/05/2003  T:  11/07/2003  Job:  [id number]   cc:   [facility], Attn: Duke Salvia

## 2010-12-02 NOTE — Discharge Summary (Signed)
NAME:  [name], [name] NO.:  [id number]   MEDICAL RECORD NO.:  [id number]                   PATIENT TYPE:  IPS   LOCATION:  0302                                 FACILITY:  [facility]   PHYSICIAN:  Jeanice Lim, M.D.              DATE OF BIRTH:  [birth year]   DATE OF ADMISSION:  06/30/2002  DATE OF DISCHARGE:  07/03/2002                                 DISCHARGE SUMMARY   IDENTIFYING DATA:  This is a 45 year old African-American female,  voluntarily admitted, presenting after a transfer from [facility]  after 5 days with diabetic ketoacidosis, medically stable prior to transfer  to [facility].  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   ADMISSION MEDICATIONS:  Triamine 100 mg q.a.m., K-Dur, Augmentin, Synacort,  and insulin.   ALLERGIES:  No known drug allergies.   PHYSICAL EXAMINATION:  Essentially within normal limits, neurologically  nonfocal.   ROUTINE ADMISSION LABS:  Urine pregnancy test negative.  HIV negative.  Hemoglobin and hematocrit were low at 9 and 32.2 respectively.  Hemoglobin  A1C was 14.5, quite elevated, suggesting noncompliance or poorly controlled  diabetes.   MENTAL STATUS EXAM:  The patient appeared older than stated age.  Good eye  contact, child-like.  Mood was anxious, affect anxious.  Thought process  coherent, goal directed.  No psychotic symptoms or dangerous ideations.  Cognitively intact, limited intellectual capacity.  Judgment and insight  fair.   ADMISSION DIAGNOSES:   AXIS I:  1. Major depression, recurrent.  2. Rule out post-traumatic stress disorder and alcohol abuse.   AXIS II:  None.   AXIS III:  Diabetes type 2, anemia, and status post incision and drainage of  an abscess.   AXIS IV:  Moderate problems with primary support group and other  psychosocial issues and medical problems.   AXIS V:  30/60.   HOSPITAL COURSE:  The patient  was admitted and ordered routine p.r.n.  medications, underwent further monitoring, and was encouraged to participate  in individual, group and milieu therapy.  Internal medicine was consulted to  follow the patient regarding medical conditions and the patient was placed  on sliding scale insulin and given regular insulin which was adjusted to  optimize control of glucose.  The patient did not require psychotropics and  aftercare planning was done.  The patient was started on Zoloft just before  discharge due to possible mild depressive symptoms.   CONDITION ON DISCHARGE:  Improved.  Mood was mostly euthymic, affect  brighter, thought process goal directed.  Thought content negative for  dangerous ideation or psychotic symptoms.  The patient reported motivation  to be compliant with medical recommendations.   DISCHARGE MEDICATIONS:  1. Augmentin 175 mg b.i.d.  2. Novolin 30 units q.a.m. and 15 units q.5 p.m., to check glucose as     directed.  3. Zoloft 25 mg  q.a.m.   DISPOSITION:  The patient was to follow up with [facility] on December 19 at 11 a.m.   DISCHARGE DIAGNOSES:   AXIS I:  1. Major depression, recurrent.  2. Rule out post-traumatic stress disorder and alcohol abuse.   AXIS II:  None.   AXIS III:  Diabetes type 2, anemia, and status post incision and drainage of  an abscess.   AXIS IV:  Moderate problems with primary support group and other  psychosocial issues and medical problems.   AXIS V:  Global assessment of function on discharge was 50.                                                 Jeanice Lim, M.D.    JEM/MEDQ  D:  07/11/2002  T:  07/11/2002  Job:  [id number]

## 2010-12-02 NOTE — H&P (Signed)
[name], [name]                         ACCOUNT NO.:  [id number]   MEDICAL RECORD NO.:  [id number]                   PATIENT TYPE:  INP   LOCATION:  3311                                 FACILITY:  [facility]   PHYSICIAN:  Lorne Skeens, D.O.                   DATE OF BIRTH:  January 21, 1966   DATE OF ADMISSION:  10/15/2002  DATE OF DISCHARGE:                                HISTORY & PHYSICAL   CHIEF COMPLAINT:  High blood sugars.   HISTORY OF PRESENT ILLNESS:  A 45 year old African American female with  history of poorly controlled insulin-dependent diabetes mellitus presented  to [facility] ED after running out of her insulin four days ago.  The  patient stated that her insulin vials broke in her purse.  Her blood sugar  was 550 at home today.  She was feeling extremely thirsty and hungry.  She  had vague abdominal soreness.  Denies any nausea, vomiting, or diarrhea or a  recent illness other than a cough.  She reports variable blood sugars at  home but states that she is compliant with CBG checks, diabetic diet, and  insulin injections.  She reports a recent alcohol binge and crack use three  days ago.  She did state that she desires to quit.  She spoke with social  work in the ED regarding cessation.  She called her PMD about running out of  insulin and was scheduled for an appointment on April 22 without refill of  her insulin.  This is per her report.  She took insulin,  approximately two  doses of her brother's insulin, about two days ago and has not taken any  insulin since.   REVIEW OF SYSTEMS:  CONSTITUTIONAL:  No fevers or chills.  A 40-pound weight  loss in the last approximately 12 months.  RESPIRATORY:  Positive productive  cough.  No rhinorrhea.  CARDIOVASCULAR:  No chest pain or palpitations.  GASTROINTESTINAL:  No nausea, vomiting, or diarrhea.  Good appetite.  MUSCULOSKELETAL:  Occasional myalgias.  ENDOCRINE:  The a.m. and afternoon  blood sugars checked variable.   SKIN:  All over pruritus.   PAST MEDICAL HISTORY:  1. Admission for DKA February 2004.  2. Uncontrolled insulin-dependent diabetes mellitus diagnosed 23 years ago.  3. Hypothyroidism.  4. Chronic organic brain syndrome.  5. Microcytic anemia secondary to chronic disease.  6. Chronic crack usage.  7. Left ear deafness.  8. Tobacco abuse.   PAST SURGICAL HISTORY:  1. Cataract surgery.  2. Ear surgery.  3. Appendectomy.   MEDICATIONS:  1. NPH 40 units subcu q.a.m. and 30 units subcu q.h.s.  2. Iron sulfate 325 mg p.o. t.i.d.   ALLERGIES:  No known drug allergies.   FAMILY HISTORY:  She has a brother with insulin-dependent diabetes mellitus.  The rest is noncontributory.   SOCIAL HISTORY:  Positive for alcohol and illicit drugs including crack  with  last drink and crack use three days ago.  She has tobacco use.  Lives in  [place] with her ex-husband, 72-month old and 50-year-old children.  Her  34 year old and 48 year old children live with their father.   PHYSICAL EXAMINATION:  VITAL SIGNS:  Temperature 98.2, pulse 100,  respiratory rate 20, blood pressure 121/74.  O2 saturation 100% on room air.  CBG's 489 to 354 in the ED.  GENERAL:  Alert and oriented x3.  Appears in no acute distress.  Smells of  cigarette smoke.  CARDIOVASCULAR:  Regular rate and rhythm with a 2/6 systolic murmur.  HEENT:  Normocephalic, atraumatic.  Pupils equal, round, and react to light  and accommodation.  Extraocular muscles intact bilaterally.  No rhinorrhea.  Oropharynx pink and moist without erythema or exudate.  LUNGS:  Clear to auscultation bilaterally without wheezing, rales, or  rhonchi.  ABDOMEN:  Soft, diffusely tender to palpation.  Positive bowel sounds x4.  No rebound, guarding, or rigidity.  NEUROLOGIC:  Grip strength +5/5.  Answers questions appropriately.  No  tremors.  SKIN:  Multiple excoriations along the back and lower extremities.  Multiple  skin lesions on her lower  extremities.   LABORATORY DATA:  BMET shows a sodium of 134, potassium 3.7, chloride 107,  bicarb 15, BUN 20, creatinine 1.3, glucose 49, calcium 8.6.  CBC shows a  white count of 5.4, hemoglobin 12.4, hematocrit 40.6, platelets 410.  Urinalysis shows a specific gravity of 1033, pH 5, glucose greater than  1000, hemoglobin large, ketones greater than 80, protein negative, nitrite  negative, leukocyte esterase negative.  Urine pregnancy negative.  White  blood cells 0-2, red blood cells too numerous to count.  Blood acetone  large.  ABG's show a pH of 7.296, PCO2 28, PO2 96, bicarb 14, O2 saturation  97%.   ASSESSMENT AND PLAN:  A 45 year old African American female with mild  diabetic ketoacidosis secondary to not taking insulin x4 days.   1. Diabetic ketoacidosis, status post normal saline bolus x2 L in the     emergency department with decreased blood sugars from 489 to 354.  She     also received 10 units of intravenous regular insulin several hours     prior.  We started her at 1 unit per hour insulin drip, that is     approximately 0.05 units per kg per day.  We are allowing her to take     p.o. in the form of diabetic diet, continue to check CBG's q.1h., and     check BMP's q.4h.  Check an A1c.  Etiology of her diabetic ketoacidosis     is lack of insulin x4 days.  It was noted that her bicarb was decreased     at 15.  Her anion gap was roughly 12, pH 7.296, mildly acidotic.  She did     have blood acetone and urine ketones.  We will resume subcutaneous     insulin at her home regimen of 40 units of NPH in the morning and 30     units at night if labs improve tonight.  Once blood sugar reaches 200, we     will change to subcutaneous insulin with one-hour overlap.  Continue     aggressive intravenous fluid rehydration and replete potassium.  2. Hypothyroidism, as noted by old records.  The patient did not state that     she was on thyroid medicine and we will check a TSH. 3.  Anemia.  Hemoglobin is stable at 12.4.  4. Crack abuse.  Social work involved.  Interested in cessation program.     Encouraged cessation.  5. Alcohol abuse.  As per #4.  6.     Tobacco abuse.  As per #4.  7. Disposition:  Discharge in the a.m. if out of diabetic ketoacidosis and     stable on insulin; will need to follow up with [facility].                                               Lorne Skeens, D.O.    KL/MEDQ  D:  10/15/2002  T:  10/16/2002  Job:  [id number]   cc:   Edson Snowball.

## 2010-12-02 NOTE — Discharge Summary (Signed)
[name], [name]                         ACCOUNT NO.:  [id number]   MEDICAL RECORD NO.:  [id number]                   PATIENT TYPE:  INP   LOCATION:  5710                                 FACILITY:  [facility]   PHYSICIAN:  Lilyan Punt. Sydnee Levans, M.D.             DATE OF BIRTH:  [birth year]   DATE OF ADMISSION:  DATE OF DISCHARGE:  12/14/2002                                 DISCHARGE SUMMARY   DIAGNOSES:  1. Sebaceous cyst/abscess with localized cellulitis, left inner posterior     thigh.  2. Uncontrolled diabetes mellitus, hyperglycemia. Admitting glucose greater     than 700, non acidotic.  3. Tobacco use.  4. Drug addiction, crack cocaine.   DISCHARGE MEDICATIONS:  1. Cephalexin 500 mg q.i.d. for an additional 7 days.  2. Resumption of home medications.  3. NPH insulin 20 units q. a.m., 10 units q. p.m.  4. Regular insulin 15 units q. a.m., 10 units q. p.m.  5. Iron supplement.  6. Potassium.   FOLLOW UP:  [facility] in one week.   DISCHARGE INSTRUCTIONS:  She was offered drug rehabilitation services  through [facility] and [facility], although she is not inclined to do so at this time.  Wound care, warm moist compresses to the affected leg wound twice daily  until healed.   HOSPITAL COURSE:  The patient was admitted early on Dec 12, 2002, by Dr.  Leander Rams because of profound hyperglycemia.  This was felt secondary to an  infectious process on her thigh. Apparently it had been treated as an  outpatient at [facility] prior to the day of admission with oral  antibiotics, but she was unable to afford that medication. Apparently her ex-  husband had thrown away my Medicaid card and glucose monitor.  She in turn  presented to the emergency department with hyperglycemia.   There was an incision and drainage of said lesion in the emergency  department. The lesion at the time of  this admission drained yellowish  purulent matter and the lesion size, tenderness and redness  were resolving  in response to IV Ancef. Her antibiotics were changed to oral medication.   Her diabetes was managed  initially through glucomander which was rapidly  discontinued with normalization of sugars and her home therapy was resumed.  She was discharged in much improved  condition on Sunday, Dec 14, 2002. Care management helped her arrange a supply of current medication,  transportation and a new glucose monitor.   FOLLOW UP:  She is to follow up at [facility].                                               Lilyan Punt Sydnee Levans, M.D.    KCS/MEDQ  D:  12/14/2002  T:  12/14/2002  Job:  [id number]   cc:   [facility]

## 2010-12-02 NOTE — Consult Note (Signed)
NAMESANYAH, [name]                         ACCOUNT NO.:  [id number]   MEDICAL RECORD NO.:  [id number]                   PATIENT TYPE:  INP   LOCATION:  6713                                 FACILITY:  [facility]   PHYSICIAN:  Hermelinda Medicus, M.D.                DATE OF BIRTH:  [birth year]   DATE OF CONSULTATION:  DATE OF DISCHARGE:                                   CONSULTATION   HISTORY:  This patient is a 45 year old female who entered the hospital with  some neck swelling and neck pain.  She had an attempted drainage for this,  thinking it was an abscess in the emergency room with very little result.  She enters with a history of having had abscesses in the past, and she had  been evaluated for this for immunologic status.  She also is being evaluated  for anemia, iron deficiency possibly.  She is also hypothyroid and she has  had these abscesses apparently on several occasions.  She has apparently  been methicillin-resistant staphylococcus aureus and has had Staphylococcus  infections in the past, the last one in May, apparently in her thigh.  Today  she is a 45 year old female who came in with some neck swelling around the  carotid region.  It has been three to four days duration.  Considerable  swelling in this region that was seen in the ER.  No drainage.  A CT scan  was achieved showing left carotid swelling surrounding the parotitis is  cellulitis but no evidence of any abscess.  She had one cervical node 13 mm  in size mid cervical region.  She has no evidence of any parapharyngeal,  tonsillar or submental Ludwig of the submaxillary gland swelling.  There is  no airway abnormalities.  The postcricoid, piriforms, and larynx are all  clear.  The trachea is clear.  The mediastinum is clear.  There is no  evidence of any stone or tumor in the left carotid.  It appears to be a  situation of left parotitis with associated cellulitis, with a single  cervical node.  The white count is  8.4, temperature 101.  On further  history, she says she has been severely dehydrated recently which can bring  this about. Her examination reveals also some swelling in her external ear  canal which may be part of an otitis externa problem.  However, the middle  ear and mastoid look quite within normal limits as do the sinuses.  Her ear  on examination does show some narrowing of the external ear canal.  Her nose  is clear.  Oral cavity is clear.  Her neck is very tender in the left  carotid region, but inferiorly is not as tender, and is showing no swelling.   INITIAL DIAGNOSES:  1. Left parotitis, no evidence of abscess, no evidence of parapharyngeal     tonsillar or submental or neck abscess.  2. Mild otitis external left.   PLAN:  We will observe on the Zosyn as the patient has already been started  on this IV.  We will add Ciprodex otic suspension.                                               Hermelinda Medicus, M.D.    JC/MEDQ  D:  03/07/2003  T:  03/08/2003  Job:  [id number]

## 2010-12-02 NOTE — Discharge Summary (Signed)
[name], [name]               ACCOUNT NO.:  [id number]   MEDICAL RECORD NO.:  [id number]          PATIENT TYPE:  INP   LOCATION:  1610                         FACILITY:  [facility]   PHYSICIAN:  Lonia Blood, M.D.       DATE OF BIRTH:  10-Jan-1966   DATE OF ADMISSION:  04/27/2006  DATE OF DISCHARGE:  04/29/2006                                 DISCHARGE SUMMARY   PRIMARY CARE PHYSICIAN:  [facility].   DISCHARGE DIAGNOSES:  1. Hyperosmolar nonketotic syndrome, resolved.  2. Brittle diabetes mellitus.  3. Homelessness.  4. Very poor health literacy.  5. Polysubstance abuse.  6. Dehydration, resolved.  7. History of bipolar disorder.  The patient is refusing any psychiatric      medications because they maker her crazy.   DISCHARGE MEDICATIONS:  1. Lantus 15 units each morning before breakfast.  2. Insulin Humalog 8 units each morning before breakfast.  3. Insulin Humalog 10 units each day before supper.   CONDITION ON DISCHARGE:  The patient was discharged in fair condition at the  time of discharge.  Her vital signs were stable.  She was discharged to the  shelter and she was provided with home health RN to help her with diabetes  education and provide her with a Glucometer.  The patient also was provided  with new supply for a Glucometer as well as Lantus and Humalog insulin and  syringes.  The patient was instructed to go to [facility]  outpatient clinic on any day of the next week, walk in there and get an  appointment to get hooked up again with them for diabetes care.   OPERATION/PROCEDURE:  During this admission, no procedures were done.   CONSULTATIONS:  No consultations were obtained.   ADMISSION HISTORY AND PHYSICAL:  Refer to dictation done by Dr. Lavera Guise April 27, 2006.   HOSPITAL COURSE:  Problem #1.  Hyperosmolar nonketotic syndrome.  [name] was admitted on April 26, 2006 from the emergency room with a  glucose level of 1200.   The patient was placed on intravenous insulin as  well as IV fluids and her glucose level was slowly corrected over the course  of the first 20 hours.  The patient was then transitioned to a sliding scale  insulin and Lantus in the morning.  It was noted during this admission that  the patient has extremely brittle diabetes with wild swings of her CPG  alternating from 60 to up to 400 in the matter of a couple of hours.  At  this point in time, we have approached the treatment of this patient's  diabetes extremely conservatively with a low dose of Lantus at 15 units  which assured that she did not have hypoglycemia.  In the long run, she  probably is going to require three times a day meal coverage with NovoLog  and she definitely needs more diabetes education.  She was provided with as  much diabetes education as humanly possible given the fact that she has a  definite low health literacy.  The  patient was instructed to follow up with  the [facility] outpatient clinic for further diabetes treatment and  education.   Problem #2.  Bipolar disorder.  [name] [name] has refused all her psychiatric  medications because they make me crazy.  At this point in time she has not  voiced any suicidal ideations. She has not voiced any threats to herself or  others, so we have no choice but to discharge her from the hospital.      Lonia Blood, M.D.  Electronically Signed     SL/MEDQ  D:  04/29/2006  T:  04/29/2006  Job:  [id number]

## 2010-12-02 NOTE — Discharge Summary (Signed)
[name], [name]                         ACCOUNT NO.:  [id number]   MEDICAL RECORD NO.:  [id number]                   PATIENT TYPE:  INP   LOCATION:  0477                                 [facility]ACILITY[facility]   PHYSICIAN:  Sherin Quarry, MD                   DATE OF BIRTH:  12-03-1965   DATE OF ADMISSION:  04/02/2003  DATE OF DISCHARGE:                                 DISCHARGE SUMMARY   ANTICIPATED DATE OF DISCHARGE:  April 13, 2003.   HISTORY OF PRESENT ILLNESS:  [name] is a 45 year old lady with a  history of type 1 diabetes dating to age 63.  The patient has been  hospitalization on very numerous occasions for problems including diabetic  ketoacidosis, pancreatitis secondary to alcohol abuse, and multiple  subcutaneous abscesses which have required incision and drainage.  She had  continued to chronically abuse both alcohol and crack cocaine.  She presents  on September 16th with multiple subcutaneous abscesses on her legs and was  admitted for treatment of this problem.  At the time of admission, the  patient did not appear to be taking any medications on a regular basis.   PHYSICAL EXAMINATION:  GENERAL:  She was a chronically ill-appearing woman  who appeared older than her stated age.  HEENT:  Within normal limits.  CHEST:  Clear.  CARDIOVASCULAR:  Normal S1 & S2.  There are no rubs, murmurs, or gallops.  ABDOMEN:  Benign.  There were normal bowel sounds without masses,  tenderness, or organomegaly.  NEUROLOGICAL:  Testing was within normal limits.  EXTREMITIES:  Revealed multiple circular subcutaneous abscesses on the  anterior legs with surrounding cellulitis.  The general appearance of these  abscesses suggested that they might be embolic in origin.   LABORATORY DATA:  Subsequent laboratory studies obtained included serial  blood cultures which were negative.  The CBC revealed a white count of 8700,  hemoglobin was 11.4, differential was unremarkable.  Sed  rate was 45.  Sodium 137, potassium 3.9, glucose 83, creatinine 0.8, BUN 10.  Liver  profile was normal.  The A1C hemoglobin was 12.5.  Serum iron was 16, iron-  binding capacity was 303.   HOSPITAL COURSE:  On admission the patient was administered an IV in the  form of a normal saline at 150 mL per hour.  She was placed on a moderately  insulin sensitive sliding scale with regular insulin.  Because of her past  history of methicillin-resistant Staphylococcus in the abscesses, she was  placed on vancomycin 1 gram IV every 12 hours.  The medications which the  patient had been receiving in the past were resumed.  These included  Synthroid, Protonix, Risperdal, and Zoloft and Ativan.  Consultation was  obtained from Currie Paris, M.D. of the surgical service.  He  recommended incision and drainage of areas which were fluctuant and carried  out  these procedures.  Consultation was also obtained from [name].  [name], M.D. of the infectious disease service who recommended that  vancomycin therapy be continued.  He followed the patient during the course  of the hospitalization and recommended that a total of 10 days of IV  vancomycin be administered.  Subsequently cultures obtained from the  abscesses once again grew a methicillin-resistant Staphylococcus.  The  patient completes 10 days of IV antibiotics.  By the time that this was  completed, the leg wounds looked very good.  They were dry.  There was no  signs of any fluctuance or drainage.  The patient's response to treatment  was quite gratifying.  On September 27th the patient was discharged.   DISCHARGE DIAGNOSES:  1. Multiple subcutaneous abscesses.  2. Uncontrolled diabetes.  3. Hypothyroidism.  4. Long-term history of alcohol and crack cocaine usage.  5. Gastroesophageal reflux.  6. History of a poorly defined psychotic disorder.   DISCHARGE MEDICATIONS:  1. Synthroid 25 mcg daily.  2. Protonix 40 mg daily.  3.  Risperdal 0.5 mg daily.  4. Zoloft 50 mg daily.   The patient will be instructed to wash thoroughly with Hibiclens on a  regular basis.  She will also resume her usual insulin regimen which  consists of 25 units of NPH insulin in the morning with 12 units of regular  units and 16 units of NPH insulin in the evening with 10 units of regular  insulin.  As has been the case in the past, it remains unclear what if any  of medical follow up this patient will receive.  She was encouraged to  follow up with [facility] as has been the case in the past.  In the past,  her compliance with medical advice has been extremely poor.   CONDITION ON DISCHARGE:  Her condition at the time of discharge was fair.                                               Sherin Quarry, MD    SY/MEDQ  D:  04/12/2003  T:  04/12/2003  Job:  [id number]

## 2010-12-02 NOTE — H&P (Signed)
[name], [name] NO.:  [id number]   MEDICAL RECORD NO.:  [id number]          PATIENT TYPE:  INP   LOCATION:  0163                         FACILITY:  [facility]   PHYSICIAN:  Lonia Blood, M.D.       DATE OF BIRTH:  12/31/1965   DATE OF ADMISSION:  04/27/2006  DATE OF DISCHARGE:                                HISTORY & PHYSICAL   The patient's primary care physician's with [facility],  but this patient is notoriously noncompliant and she has not seen her doctor  at the clinic in 1 year.   CHIEF COMPLAINT:  Hyperglycemia.   HISTORY OF PRESENT ILLNESS:  [name] is a 45 year old woman with  probable diabetes mellitus type 1, with history of brittle diabetes and wild  swings in fasting plasma glucose, who was brought to the emergency room for  complaints of sugar reading too high.  The patient also reported prior to be  being brought to the emergency room that she was not feeling too good.  It  is unclear at this point in time if she has any clear complaints, but she  would talk to me only monosyllabically and she would refuse to speak in full  sentences and provide a good history.  Overall, though, she denies any chest  pain.  Denies any nausea, vomiting or abdominal pain.   PAST MEDICAL HISTORY:  1. Diabetes mellitus.  2. History of a skin abscess.  3. Bipolar disorder with psychotic features with prior involuntary      commitments at [facility].  4. Polysubstance abuse  5. Question history of glaucoma.  History of cataract extractions.   HOME MEDICATIONS:  Insulin, unclear dose and unclear how she stores it and  how she uses it.   SOCIAL HISTORY:  The patient reports being married.  She is homeless and  lives in [place].  She has five children but does not have custody of  them.  She has a history of tobacco cocaine use, but she claims she has not  used any recently.  She lives at the homeless shelter.   FAMILY HISTORY:  The  patient's mother age 60, with unknown health problems.  The patient father is alive with unknown health problems.  There is a  brother with diabetes.   REVIEW OF SYSTEMS:  The patient is not forthcoming with a lot of complaints  at this point in time, so seems to be negative.   PHYSICAL EXAMINATION UPON ADMISSION:  VITAL SIGNS:  Temperature is 97.0,  blood pressure 129/76, pulse 110, respirations 20, saturation 98% on room  air.  GENERAL APPEARANCE:  A cachectic African American woman with disheveled  appearance, in no acute distress lying on the stretcher.  HEENT:  Head is normocephalic, atraumatic.  Eyes have pupils equal, round  and reactive to light and accommodation.  Extraocular movements intact.  Buccal mucosa is dry.  Throat is clear.  NECK:  Supple.  No JVD.  No thyromegaly.  CHEST:  Clear to auscultation without wheezes, rhonchi or crackles.  HEART:  Regular rate and rhythm  without murmurs, rubs or gallops.  ABDOMEN:  Soft, nontender.  Bowel sounds are present.  EXTREMITIES:  No edema.  MUSCULOSKELETAL:  Decreased muscle mass but no deformity in the joints.  SKIN:  Covered with a small maculopapular rash that does not appear to be  active.  Not surrounded by areas of inflammation or abscess formation.  NEUROLOGIC:  Unable to fully test.  PSYCHIATRIC;  unable to fully test due to the patient's lack of cooperation.   LABORATORY VALUES:  At time of admission, white blood cell count 4.7,  hemoglobin 11, platelet count 323.  Sodium 119, potassium 5, chloride 85,  bicarb 22, glucose 1200, BUN 26, creatinine 1.6, calcium 9.4.  Chest x-ray  shows no acute cardiopulmonary process.   ASSESSMENT AND PLAN:  1. Nonketotic hyperosmolar syndrome.  [name] was started on the      Glucommander in the emergency room and her sugars are already      improving.  Last checked glucose on the basic metabolic profile was      600.  We are going to be careful as not to lower her glucose level  to      swiftly due to the danger of cerebral edema.  We will continue      intravenous insulin drip but at a slower rate and obtain frequent      checks for the CBGs.  The patient will also receive generous      intravenous fluids.  Once her CBGs are below 200, will transition her      to sliding-scale insulin and will start giving her a diabetic diet.      This patient obviously needs good primary care, but she is too      noncompliant.  At this point in time will also ask for social worker      consult to assess if there are any needs or problems in the future.  2. Polysubstance abuse.  We will check a urine drug screen and start from      there since the patient appears to be denying any current substance      abuse.  3. Dehydration, acute renal failure.  Will continue intravenous fluids and      follow up basic metabolic profiles.  4. History bipolar disorder.  I am going to resume the Cogentin.  Of note,      the patient told me that she is not taking any of her psychiatric      medicines.  5. Gastrointestinal prophylaxis will be done using Protonix.  DVT      prophylaxis will be done using Lovenox.      Lonia Blood, M.D.  Electronically Signed     SL/MEDQ  D:  04/27/2006  T:  04/28/2006  Job:  [id number]

## 2010-12-02 NOTE — Consult Note (Signed)
NAME:  [name], [name]                         ACCOUNT NO.:  [id number]   MEDICAL RECORD NO.:  [id number]                   PATIENT TYPE:  INP   LOCATION:  6704                                 FACILITY:  [facility]   PHYSICIAN:  [name], M.D.                DATE OF BIRTH:  [birth year]   DATE OF CONSULTATION:  07/28/2003  DATE OF DISCHARGE:                                   CONSULTATION   HISTORY OF PRESENT ILLNESS:  The patient is a 45 year old female with a past  medical history of diabetes mellitus, substance abuse, noncompliance,  hypothyroidism, and organic brain syndrome, who I asked to evaluate for  murmur and question need for TEE.  The patient's history dates back to  August of 2004 when she apparently had MRSA in a neck abscess that was  documented by culture and the abscess was present on CT scan.  She also had  one of two blood cultures that were positive for MRSA.  She apparently was  treated, but was felt to be inadequate therapy.  She did have a followup  blood culture in September of 2004 that was negative, although a wound  culture again showed MRSA.  She was recently admitted with diabetic  ketoacidosis and she has been treated with improvement.  However, they  noticed a question of murmur on exam and with her history of MRSA by blood  culture in August of 2004 they question the need for TEE.  On note, the  patient denies any fevers, chills, or hematuria.  She denies any increased  shortness of breath or chest pain.  She has been afebrile since admission  and her white blood cell count was normal.  She did have followup blood  cultures drawn this admission that are negative today.   MEDICATIONS:  Her medications at the time of this dictation include insulin,  Protonix, IV fluids, Phenergan p.r.n., and Ativan p.r.n.   ALLERGIES:  She had no known drug allergies.   SOCIAL HISTORY:  She does have a long history of substance abuse, including  cocaine, tobacco, and  alcohol.  She states that she lives with her mother.   FAMILY HISTORY:  Positive for coronary artery disease in siblings by report.   PAST MEDICAL HISTORY:  She denies any hypertension or hyperlipidemia, but  she does have diabetes mellitus.  She does have a history of hypothyroidism.  She has a history of anemia with a question of thalassemia.  There is also a  question of DKA.  She has a long history of polysubstance abuse.  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   PAST SURGICAL HISTORY:  She has had prior cataract surgery, appendectomy,  and ear surgery.   REVIEW OF SYSTEMS:  She denies any fevers or chills.  She has had no  hemoptysis.  There is a productive  cough.  There is no dysphagia,  odynophagia, or melena, but she states that she has occasionally had  hematochezia.  There is no dysuria or hematuria.  There is no seizure  activity.  There is no orthopnea, PND, or pedal edema.  The remaining  systems are negative.   PHYSICAL EXAMINATION:  VITAL SIGNS:  She has been afebrile and her blood  pressure is 90/42.  Her pulse is 84.  She is 100% on room air.  GENERAL APPEARANCE:  She is well developed and well nourished.  In no acute  distress at present.  She is pleasant at the time of this examination and  does not appear to be depressed.  There is no peripheral clubbing.  SKIN:  Multiple lesions over her lower extremities and upper extremities.  HEENT:  Unremarkable with normal eyelids.  NECK:  Supple.  There is normal upstroke bilaterally.  There is a fullness  in the neck area bilaterally, although I do not appreciate erythema or  warmth.  CHEST:  Clear to auscultation.  Normal expansion.  CARDIOVASCULAR:  Regular rate and rhythm with normal S1 and S2.  There are  no murmurs, rubs, or gallops noted.  ABDOMEN:  Mild diffuse tenderness to palpation, but there is no rebound or  guarding.  There are no masses  appreciated.  There is no abdominal bleed.  She has 2+ femoral pulses bilaterally and no bruits.  EXTREMITIES:  No edema.  I can palpate no cords.  She has 2+ dorsalis pedis  pulses bilaterally.  She does not have peripheral stigmata of SBE.  NEUROLOGIC:  Grossly intact.  She answers questions appropriately.   LABORATORIES:  Followup blood cultures drawn this admission are negative  thus far.  Her white blood cell count today is 4.7 with a hemoglobin of 9.9  and a hematocrit of 31.3.  Her MCV is 65.7.  The platelet count is 277.  The  sodium is 137 and the potassium is 4.6.  The BUN and creatinine are 6 and  0.8, respectively.  Her enzymes are negative x 2.  Her TSH is normal.  A  pregnancy test was negative.  Her urine drug screen was positive for  cocaine.  She did have a urinalysis that showed trace blood.  An  electrocardiogram is not available on the chart.  She does have telemetry  that shows no increased AV delay.   DIAGNOSES:  1. Question murmur.  2. History of methicillin-resistant Staphylococcus aureus noted on neck     abscess with one of two blood cultures positive in August of 2004.  3. Diabetes mellitus with recent diabetic ketoacidosis.  4. Substance abuse.  5. Noncompliance.  6. History of hypothyroidism.  7. History of organic brain syndrome.   PLAN:  Mrs. Driskill has been admitted with DKA which is now improving.  There  is a question of murmur and whether she needs a TEE to rule out SBE.  I can  find no clear indication for this at present.  She has had no fevers or  chills.  There is no peripheral stigmata of SBE on exam.  Her white blood  cell count is normal and I cannot appreciate murmurs when I examine the  patient.  She also has had followup cultures that are negative to date.  There was a neck abscess documented by CT scan in August of 2004 that was  the most likely source of her positive blood culture.  We will plan to check a  transthoracic echocardiogram.   If this shows no significant vegetations  and followup cultures drawn this admission are negative, then I think this  would be adequate and I would not pursue TEE.  Her DKA is most likely  secondary to noncompliance.  Please call us if there are any further  questions or if her blood cultures are positive.                                               [name], M.D.    BC/MEDQ  D:  07/28/2003  T:  07/28/2003  Job:  [id number]
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
## 2010-12-02 NOTE — Discharge Summary (Signed)
[facility]  Patient:    BRITANEE, VANBLARCOM Visit Number: [id number] MRN: [id number]          Service Type: MED Location: [phone] Attending Physician:  Ezequiel Kayser Dictated by:   Gaspar Garbe, M.D. Admit Date:  12/18/2001 Discharge Date: 12/20/2001   CC:         Rodrigo Ran, M.D.  [facility]   Discharge Summary  DATE OF BIRTH:  [birth year]  DISCHARGE DIAGNOSES: 1. Diabetic ketoacidosis. 2. Diabetes mellitus, type 2. 3. Polysubstance abuse, cocaine positive on drug screen. 4. Otitis media. 5. Anemia.  LABORATORY DATA:  The patient had urine pregnancy and blood hCG, both negative.  She had an HIV test which was negative.  The patient had negative alcohol level and a drug screen that was positive for cocaine.  The patient had an ultrasound of the pelvis which showed that the patient was having difficulty with cooperation, no evidence of abnormality of the gallbladder or common bile duct with normal liver and pancreas, although poorly visualized due to cooperation.  Initial chest x-ray was negative for acute cardiac or pulmonary processes.  Labs on the day of discharge showed a white count of 4.2, a hemoglobin of 10.4 which is stable, and platelets of 283.  Sodium 138, potassium 3.9, BUN and creatinine 7 and 0.7.  She had a bicarb of 21 and no gap was noted.  LFTs were within normal limits as well.  The patient had a slightly decreased phosphorus of 1.9.  HOSPITAL COURSE:  The patient was admitted in DKA.  In the intensive care unit was given insulin and later glucose with insulin, as well as Zosyn, and she had abdominal complaints and complaints consistent with otitis media as well. She was moved from the intensive care unit after her gap closed on December 19, 2001 and was tolerating orals well.  She did not have any other particular complaints on the day of her discharge except for some slight nausea but felt that she wanted to  leave and had to be urged to get labs this morning after refusing this once per the nurse.  The patient stated that she would get her own followup at [facility].  We also talked briefly about her drug usage and I suggested NA.  The patient had a very flat affect during that.  The patient was discharged in good condition on the morning of December 20, 2001.  DISCHARGE MEDICATIONS: 1. NPH insulin 20 units in the morning and 10 at night. 2. Regular insulin 15 units in the morning and 10 at night. 3. Augmentin 875 mg twice daily x5 days with prescriptions written for all the    above. 4. She is to resume her other normal medications.  DIET:  She is to be on an ADA diet.  WOUND CARE:  Not applicable.  ACTIVITY:  No immediate restrictions.  SPECIAL INSTRUCTIONS:  The patient was instructed to discontinue her drug usage as prior and Narcotics Anonymous was suggested to her.  FOLLOWUP:  Follow up with [facility] as needed.  CONDITION ON DISCHARGE:  Good. Dictated by:   Gaspar Garbe, M.D. Attending Physician:  Ezequiel Kayser DD:  12/20/01 TD:  12/23/01 Job: [id number]

## 2010-12-02 NOTE — Op Note (Signed)
   [name], [name]                         ACCOUNT NO.:  [id number]   MEDICAL RECORD NO.:  [id number]                   PATIENT TYPE:  INP   LOCATION:  3023                                 FACILITY:  [facility]   PHYSICIAN:  Jimmye Norman III, M.D.               DATE OF BIRTH:  Oct 06, 1965   DATE OF PROCEDURE:  06/26/2002  DATE OF DISCHARGE:                                 OPERATIVE REPORT   PREOPERATIVE DIAGNOSES:  Right gluteal abscess.   POSTOPERATIVE DIAGNOSES:  Right gluteal abscess.   OPERATION PERFORMED:  Incision and drainage and packing of right gluteal  abscess.   SURGEON:  Marta Lamas. Lindie Spruce, M.D.   ASSISTANT:  None.   ANESTHESIA:  IV conscious sedation with 2 mg Versed and 50 mcg of fentanyl.  She also received subcutaneous injection of 2% Xylocaine.   COMPLICATIONS:  None.   The procedure was done at the patient's bedside on 3000.   DESCRIPTION OF PROCEDURE:  The patient was placed in the left lateral  decubitus position with the right buttock up in the air.  She was prepped  with Betadine.  She was subsequently given 2 mg of IV Versed with a blood  pressure of 98/58, oxygen saturations of 100%, respiratory rate of 18 and 50  mcg of fentanyl.  Her blood pressure remained stable, did not dip below the  initial value.   We injected the site after prep with 2% Xylocaine in and around the abscess  cavity and then subsequently opening using the #11 blade that was provided  the incision and drainage kit.  There was a pocket deep into the gluteal  space which was subsequently packed with half inch iodoform Nu Gauze.  Once  the packing was done, the patient was monitored for an additional thirty  minutes for respirations and blood pressure and heart rate.  The patient  tolerated the procedure well.  She will have the packing removed in two days  when sitz baths should be started.                                                  Kathrin Ruddy, M.D.    JW/MEDQ  D:   06/26/2002  T:  06/26/2002  Job:  [id number]

## 2010-12-02 NOTE — Discharge Summary (Signed)
[name], [name]                         ACCOUNT NO.:  [id number]   MEDICAL RECORD NO.:  [id number]                   PATIENT TYPE:  INP   LOCATION:  6704                                 FACILITY:  [facility]   PHYSICIAN:  Leighton Roach McDiarmid, M.D.             DATE OF BIRTH:  1966-01-28   DATE OF ADMISSION:  07/27/2003  DATE OF DISCHARGE:  07/30/2003                                 DISCHARGE SUMMARY   ATTENDING PHYSICIAN:  Leighton Roach McDiarmid, M.D.  This dictation is done by  Rayne Du, MS IV on behalf of [name], M.D., third year Resident  [facility].   FINAL DIAGNOSES:  1. Diabetic ketoacidosis, resolved.  2. Diabetes mellitus type 1 under poor control.  3. Dehydration.  4. History of polysubstance abuse including cocaine and alcohol.  5. Organic brain disease.  6. History of methicillin resistant Staphylococcus aureus bacteremia.   PRIMARY CARE PHYSICIAN:  [facility].   CONSULTATIONS:  1. [name]. [name], M.D. of Infectious disease.  2. [name], M.D. of [facility].   PROCEDURES PERFORMED:  Two-dimensional transthoracic echocardiogram on  July 29, 2003.   LABORATORY DATA:  Admission labs venous blood gas pH 7.21, pCO2 27.3, bicarb  12.2, oxygen saturation 32.  White blood cell count 7.1, hemoglobin 17.3,  hematocrit 51.0, platelet count 390,000.  Sodium 133, potassium 4.7,  chloride 108, bicarb 16, BUN 11, creatinine 1.2, glucose 420.  Urinalysis  showing specific gravity of 1.025, greater than 1000 glucose, greater than  80 ketones.  Complete metabolic panel shows total bilirubin 0.7, alkaline  phosphatase 93, AST 43, ALT 20, total protein 9.5, albumin 4.5, calcium 7.9,  phosphorus 3.3, magnesium 2.2, amylase 67, lipase 13.  TSH 1.082.  Urine  pregnancy test negative.  Urine drug screen is positive for cocaine.  Cardiac enzymes CK 111, 119.  CK-MB 4.2, 5.4.  Troponin 0.01, 0.01.  Index  3.8, 4.5.  HIV was negative.  Blood  culture at the time of discharge was no  growth X3 days.  After intravenous fluids and insulin BMET shows sodium 135,  potassium 3.7, chloride 108, bicarb 20, BUN 7, creatinine 0.8, glucose 102,  calcium 8.1 with gap of 11.  Other labs included CBG's and for those  results, please see hospital course problem #1 below.   HISTORY OF PRESENT ILLNESS:  Please see dictated history and physical for  more in depth information, however, in brief, the patient is a 45 year old  African-American female with poorly controlled diabetes mellitus type 1 and  a history of alcohol and cocaine abuse who was admitted on July 27, 2003  with recent onset of nausea and vomiting X2 hours and abdominal pain.  The  patient has a history of noncompliance with her home insulin regimen.  Previous hospital admission lists her home regimen as 25 units of NPH with  12 units of Regular Insulin in the morning, 16 units  of NPH with 10 units of  Regular Insulin in the evening.   PAST MEDICAL HISTORY:  Significant for anemia, hypothyroidism, polysubstance  abuse, diabetes mellitus  type 1, history of methicillin resistant  Staphylococcus aureus with multiple episodes of cellulitis and a left neck  abscess in the past.  Organic brain syndrome and multiple psychiatric  issues.   SOCIAL HISTORY:  Positive for cocaine abuse, alcohol abuse and tobacco  abuse.   HOSPITAL COURSE:  The patient was admitted with a presumptive diagnosis of  DKA.  An insulin drip was instituted as well as aggressive intravenous fluid  rehydration.   PROBLEM #1:  DIABETIC KETOACIDOSIS, DIABETES MELLITUS TYPE 1, POOR CONTROL:  After being admitted from the emergency department the patient began an  intravenous insulin drip per the Glucomander protocol with hourly CBG's.  We  also immediately began vigorous intravenous fluid rehydration, first with  normal saline and then with D5 normal saline, after her blood glucoses had  trended below 250.   Basic metabolic panels were monitored every 4 hours to  monitor anion gap, bicarbonate levels and potassium.  Her electrolytes were  repleted as needed through the intravenous fluids.  One hour CBG's were also  monitored.  Once the anion gap had closed to less than 12 and the patient  was tolerating oral feeds and liquids she was weaned off intravenous fluids  and encouraged to take p.o. fluids.  At that time we also began subcutaneous  insulin with 15 units of NPH in the morning and 10 units of NPH in the  evening, monitoring with 4 hour CBG's and sliding scale insulin coverage in  between.  On the day of discharge the patient's blood glucoses ranged from  207 to 188 after the A.M. dose of NPH and 188 to 244 after the P.M. dose of  NPH.  While far from adequate control, there is a great concern for  hypoglycemia secondary to the patient's poor social situation,diet and crack  cocaine use and history of noncompliance in the past.  Hence, the patient  was discharged home on this regimen with hope for titrating to closer  control through [employer] [facility], her primary care physician.  Care management  was also consulted concerning acquisition of diabetic testing supplies and  financial assistance with obtaining her medicines.   PROBLEM #2:  HISTORY OF METHICILLIN RESISTANT STAPHYLOCOCCUS AUREUS WITH A  NEW MURMUR:  On admission a 2-3/6 systolic ejection murmur was appreciated.  The patient has a history of intravenous drug use and possible repeated MRSA  infections and concern for valvular vegetations on her heart were raised,  however, the patient has no sequelae of bacterial endocarditis.  However,  infectious disease and cardiology were consulted.  Blood cultures were  negative X4 days.  A transthoracic echocardiogram was ordered to evaluate  for possible valve vegetations.  Blood cultures were negative x3 days at the time of discharge.  The echocardiogram was normal with ejection fraction of   55 to 65% and no evidence of vegetations.  The patient was discharged home  with no further antibiotic therapy.   PROBLEM #3:  HYPOCALCEMIA:  The patient was discharged home on a regimen of  calcium carbonate 1,000 mg one p.o. b.i.d.   FOLLOW UP:  The patient was instructed to follow up with [facility] on  [facility] in one to two weeks.  A phone number, [phone] was  provided and the patient was instructed to see them for further  glycemic  control once she was on her normal diet.   DISCHARGE INSTRUCTIONS:  The patient was given information regarding  diabetic testing and diet.  She was told to record her A.M. and evening  blood sugars prior to breakfast and to supper and carry these recordings  with her to her [facility] appointment such that they may further increase  her insulin regimen as needed.   DISCHARGE MEDICATIONS:  1. NPH insulin 15 units subcutaneously q.a.m. at 8 o'clock, NPH 10 units     subcutaneously daily at 5 P.M.  2. Synthroid 25 mcg tablets one p.o. daily.  3. Calcium carbonate 1,000 tablets one p.o. b.i.d.      [name], M.D.                         Etta Grandchild, M.D.    Jearld Pies  D:  07/30/2003  T:  07/30/2003  Job:  [id number]

## 2010-12-02 NOTE — Discharge Summary (Signed)
[name], [name]                         ACCOUNT NO.:  [id number]   MEDICAL RECORD NO.:  [id number]                   PATIENT TYPE:  INP   LOCATION:  6713                                 FACILITY:  [facility]   PHYSICIAN:  Ileana Roup, M.D.               DATE OF BIRTH:  1966/03/17   DATE OF ADMISSION:  03/06/2003  DATE OF DISCHARGE:  03/17/2003                                 DISCHARGE SUMMARY   REASON FOR ADMISSION:  The patient presented to the emergency department  with a four day history of dizziness and weakness, and a left neck abscess.   MEDICAL PROBLEM LIST:  1. Left neck abscess.  2. Diabetes mellitus poorly controlled.  3. 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  4. Hypothyroidism.  5. Cocaine abuse.  6. History of alcohol abuse.  7. Suicidal ideation.   CONSULTATIONS:  1. Hermelinda Medicus, M.D., ENT  2. [name]. [name], M.D., Infectious Disease  3. Helen A. Fraser Din, M.D., Cardiology  4. Antonietta Breach, M.D., Psychiatry   DIAGNOSTIC STUDIES:  1. CT of the neck on March 07, 2003 showed a peripheral left parotid     phlegmonous process with microabscesses and extensive cellulitis.  2. Echocardiogram.  A TEE was scheduled for Mrs. Gordon to evaluate the     patient for endocarditis; however, the patient left against medical     advice prior to being able to obtain the study.  3. Due to the patient's history of recurrent abscesses, immunoglobulins were     evaluated.  All were within normal limits, including IgA, IgE, IgM, and     the four subclasses of IgG. Complement level was elevated.  CH-50 was     elevated at 201.   HOSPITAL COURSE:  Problem #1.  Left neck abscesses for parotid phlegmon:  The patient was initially started on Rocephin and acetazolam, and then  changed to Zosyn for a MRSA positive blood culture and wound culture.  Dr.  Lenn Sink of Infectious Diseases was consulted.  The patient was  switched to IV vancomycin on March 15, 2003 with loss  of IV access, and IV  team was unable to place an IV, and subsequently a PICC line was scheduled  for radiology; however, there was a delay in the ability for the PICC to be  placed, so the patient was started on p.o. Zyvox.   Problem #2.  Diabetes mellitus:  The patient was maintained during her  hospital stay on NPH insulin, and dosages were titrated throughout her stay  to help maintain adequate blood sugar control, anemia, question of  thalassemia.  The patient did have a serum ferritin of 11.  It was decided  to treat the patient with ferrous sulfate after her infection cleared.   Problem #3.  Hypothyroidism:  TSH was 3.746 which was normal on admission.   Problem #4.  Cocaine abuse.   Problem #5.  EtOH abuse.  ACT  team was consulted.   Problem #6.  Suicidal ideation:  The patient had suicidal ideations during  her stay.  Dr. [name] of Psychiatry was consulted.  After talking with  them for psychiatric recommendations, the patient was felt to be safe when  medically ready for discharge, and suicide precautions were discontinued.   Of note, the patient did not complete the appropriate therapy for her MRSA  positive parotid infection.  The patient left against medical advice.  The  patient left her room to go out and smoke, left personal items in the room,  and did not return to the hospital.  She called and stated that she was at  her brother's house, and that she was not returning to the hospital.   Prior to her discharge from the hospital against medical advice, the patient  received four days of IV vancomycin.  The patient received two 600 mg doses  of Zyvox.      Donald Pore, MD                          Ileana Roup, M.D.    HP/MEDQ  D:  06/05/2003  T:  06/07/2003  Job:  [id number]
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
## 2010-12-02 NOTE — Consult Note (Signed)
[name], [name]                         ACCOUNT NO.:  [id number]   MEDICAL RECORD NO.:  [id number]                   PATIENT TYPE:  INP   LOCATION:  3023                                 FACILITY:  [facility]   PHYSICIAN:  Sandria Bales. Ezzard Standing, M.D.               DATE OF BIRTH:  01-18-1966   DATE OF CONSULTATION:  06/25/2002  DATE OF DISCHARGE:                                   CONSULTATION   REASON FOR ILLNESS:  Multiple abscesses.   HISTORY OF PRESENT ILLNESS:  This is a 45 year old black female who was  admitted early in the morning on June 25, 2002, who is a Information systems manager  patient. Has been sick for several days with fever, weakness, and apparently  presented with a glucose of greater than 600.   She has had, apparently, multiple abscesses and has been seen actually over  at [facility] several times in the last few days, but I am not sure what  all they have been doing for her abscesses.   ALLERGIES:  She lists no allergies.   MEDICATIONS:  Her current medicines are multivitamins.  She takes insulin  twice a day; sounds like insulin R 15 mg b.i.d. and insulin N 15 in the  morning and 10 at night.  She is also on iron and potassium.   REVIEW OF SYMPTOMS:  PULMONARY:  She has had a green sputum she coughs up,  but no other significant problem.  CARDIAC:  No chest pain or hypertension.  GASTROINTESTINAL:  She has an upset stomach at times, but no history of  peptic ulcer disease or liver disease.  She has had a prior appendectomy and  cesarean sections.  __________  infections.  GYN:  She has had five  children, apparently still lives with three of them.  She has a remote  history of syphilis.  She has been tested for HIV and has been reported as  negative.  She is by herself in the room when I examined her.   PHYSICAL EXAMINATION:  VITAL SIGNS:  Temperature 100.2 earlier, now down to  98.8.  Her pulse is 80, blood pressure of 100/65.  GENERAL:  She is a well-nourished,  pleasant, black female.  Alert and  cooperative on physical exam.  LUNGS:  Clear to auscultation.  HEART:  Regular rate and rhythm, without murmur or rub.  ABDOMEN:  Soft.  She has a well-healed right lower quadrant incision from  her appendectomy and a Pfannenstiel incision from her cesarean sections.  SKIN:  She has some suprapubic inflammation or induration.  This is kind of  a superficial abscess.  Actually, the biggest area is on her buttocks, where  she has an approximate 4 cm necrotic skin over a probably 4-5 cm abscess.  She has something else on her left knee area.   LABORATORY DATA:  Labs that I have show a blood gas with  a pH of 7.49, from  yesterday.  A white blood count 9700, hemoglobin 10, hematocrit 32, MCV  65.  Her sodium was 128, potassium 5.6, chloride 100, glucose of 616.  Again,  these are all on admission blood sugars, I guess.  Her hemoglobin A1c was  14.5.  Urinalysis shows greater than a gram of sugar in her urine.   IMPRESSION:  1. Right buttock abscess, probably secondary to her poorly controlled     diabetes, and probably also contributed to her poorly controlled     diabetes.  2. Insulin-dependent diabetes.  3. History of cocaine or drug abuse.  4. Microcytic anemia.  5. Bronchitis.                                               Sandria Bales. Ezzard Standing, M.D.    DHN/MEDQ  D:  06/25/2002  T:  06/25/2002  Job:  [id number]   cc:   Georgina Quint. Plotnikov, M.D. [facility]  [address]  [place]  [place] [address]  Fax: 1

## 2010-12-02 NOTE — Consult Note (Signed)
NAME:  [name], [name]                         ACCOUNT NO.:  [id number]   MEDICAL RECORD NO.:  [id number]                   PATIENT TYPE:  INP   LOCATION:  0477                                 FACILITY:  [facility]   PHYSICIAN:  Currie Paris, M.D.           DATE OF BIRTH:  September 02, 1965   DATE OF CONSULTATION:  DATE OF DISCHARGE:                                   CONSULTATION   REASON FOR CONSULTATION:  Possible pyoderma gangrenosa, possible drainage,  possible biopsy of skin.   MEDICAL HISTORY:  [name] is a 45 year old lady who has had multiple  hospitalizations with type 1 diabetes and diabetic ketoacidosis and  pancreatitis.  She has had a history of alcohol abuse and cocaine abuse.  She has had multiple problems with abscesses including buttock area, leg  area, neck, back, and recently had an episode of salivary gland infection.  She has had psychiatric admissions as well.  Most of the time she presents  with diabetic ketoacidosis.   Yesterday she was admitted with increasing pain in her legs and what  appeared to be subcutaneous abscesses developing.  She has had intermittent  fevers at home but no nausea, vomiting, or other GI symptoms.   PAST HISTORY:   MEDICATIONS:  As best we can determine, her only medication was insulin,  although she was supposed to be taking Synthroid, Protonix, Zoloft, and  Risperdal.   PRIOR SURGERIES:  Appendectomy, fissures, drainage of abscesses, etc.   REVIEW OF SYSTEMS:  Basically seems negative.   EXAM:  GENERAL:  The patient is a chronically ill lady.  Alert and not  septic appearing.  EXTREMITIES:  Show multiple circular subcutaneous abscesses in both lower  extremities with a little surrounding cellulitis around some of them, and  these all appear to be superficial with some areas of epidermolysis.   DATA REVIEW:  Laboratory studies are noted and basically unremarkable except  for problems with glucose.  Chart is noted as well  as I have gone through  the E-chart and read multiple other admissions.   She has been seen in the past by other members of our service including Drs.  Royston Bake, and [place].  ID consultation with Dr. [name] suggested  these may be pyoderma gangrenosa, and he suggested perhaps skin biopsy.   IMPRESSION:  Multiple superficial skin abscesses with a history of prior  methicillin-resistant Staphylococcus aureus neck infection, possible  pyoderma gangrenosa.    PLAN:  I discussed the situation with the patient, and tomorrow when some  __________ fail, we will try to do some skin punch biopsies from one of  these.  If not, will need to do a small incisional biopsy of the leg lesion.  We can do this with local anesthesia.  Other than opening up some of these  epidermolysis areas, I do not think any other surgical debridement will be  indicated.  Currie Paris, M.D.    CJS/MEDQ  D:  04/03/2003  T:  04/04/2003  Job:  [id number]

## 2010-12-02 NOTE — Consult Note (Signed)
   [name], MAGNER                         ACCOUNT NO.:  [id number]   MEDICAL RECORD NO.:  [id number]                   PATIENT TYPE:  INP   LOCATION:  2116                                 FACILITY:  [facility]   PHYSICIAN:  Gabrielle Dare. [name], M.D.             DATE OF BIRTH:  Nov 26, 1965   DATE OF CONSULTATION:  08/31/2002  DATE OF DISCHARGE:                                   CONSULTATION   REASON FOR CONSULTATION:  Central venous access.   HISTORY OF PRESENT ILLNESS:  The patient is a 45 year old insulin-dependent  diabetic who was admitted in DKA.  She also has a history of substance  abuse.  She has been off her insulin for a week, and she used some crack  cocaine a couple of days ago.  She is admitted today in DKA through  Hospitalists service, and they requested central access to give her some  antibiotics.   PAST MEDICAL HISTORY:  1. Longstanding type 1 diabetes.  2. Past history of alcohol abuse.  3. Past history of anemia.  4. Multiple veneral disease, although she is thought to be HIV negative.   PAST SURGICAL HISTORY:  None known.   SOCIAL HISTORY:  Cocaine abuse, alcohol abuse.  She has five children.   REVIEW OF SYSTEMS:  Unable to be obtained due to the patient not responding  well to questions.   PHYSICAL EXAMINATION:  VITAL SIGNS:  Heart rate 120, respiratory rate 18,  blood pressure 120/70.  GENERAL:  She is awake and intermittently responsive.  LUNGS:  Clear to auscultation bilaterally.  HEART:  Regular rate and rhythm and tachycardic.  ABDOMEN:  Soft and nontender.  EXTREMITIES:  She has an abscess on her left arm.   DATA REVIEW:  Glucose 950.  She is currently being treated for that.  Creatinine 2.6.   ASSESSMENT/PLAN:  Severe diabetic ketoacidosis and need for central access.   I will proceed with attempting to place the left subclavian triple lumen  catheter.  If unable to place that, I will place a femoral catheter.  The  patient was consented for  the procedure, and I discussed this as well with  her mother.   Thank you for this consult.                                               Gabrielle Dare [name], M.D.    BET/MEDQ  D:  08/31/2002  T:  08/31/2002  Job:  [id number]
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
## 2010-12-02 NOTE — Discharge Summary (Signed)
NAMEVERYL, [name]                         ACCOUNT NO.:  [id number]   MEDICAL RECORD NO.:  [id number]                   PATIENT TYPE:  INP   LOCATION:  3710                                 FACILITY:  [facility]   PHYSICIAN:  Lazaro Arms, M.D.        DATE OF BIRTH:  12-Aug-1965   DATE OF ADMISSION:  08/31/2002  DATE OF DISCHARGE:  09/05/2002                                 DISCHARGE SUMMARY   DISCHARGE DIAGNOSES:  1. Severe diabetic ketoacidosis: resolved.  2. Uncontrolled diabetes mellitus.  3. Hypothyroidism with a history of seizures, stable.  4. Chronic organic brain syndrome.  5. Chronic crack usage.  6. Boil to left forearm, status post incision and drainage.  7. Microcytic anemia secondary to chronic disease:  stable.  8. Leukocytosis:  requiring follow up.   MEDICATIONS:  1. NPH insulin 40 units subcutaneously q.a.m., NPH 30 units subcutaneously     q.h.s.  2. Augmentin 875 mg p.o. x3 doses.  3. Normal saline wet to dry dressings left forearm.  4. Iron sulfate 325 mg p.o. t.i.d. x90 days.   ALLERGIES:  NKDA.   PROCEDURES:  The patient underwent I&D left forearm performed by Dr. Chevis Pretty on August 04, 2002.  The patient tolerated the procedure well and  there were no complications.  The patient will require dressing changes as  noted.  Left femoral vein central line placement on August 31, 2002.   HISTORY OF PRESENT ILLNESS:  The patient is almost incomprehensible to  understand.  Apparently she ran out of insulin approximately one week ago.  She has been vomiting times four days and has been extremely tired.  She has  noted an abscess on her left arm.  She has a history of multiple abscesses  on stomach and buttocks.  She admits to smoking crack but denies use of IV  drugs.  I cannot determine where she has been living and how she got to the  ER (per Dr. Sherin Quarry).  On initial evaluation patient's serum glucose  was found to be 950.  Patient's CO2  was also noted to be less than 5 on  presentation resulting in anion gap of 834.  Creatinine was also noted to be  2.6 with a BUN of 37.  Urine was positive for ketones, protein, glucose and  cocaine.  Patient was admitted for treatment of severe diabetic ketoacidosis  with dehydration.   The patient was admitted to critical care, placed on Glucometer, provided  with aggressive IV hydration and IV antibiotic coverage for abscess on her  left forearm.  She was noted to have a fluctuant mass on her left forearm  dorsal aspect approximately three centimeters around.  There was no  accompanying erythema, warmth or drainage to that site.  The patient's anion  gap was closed and she was placed on Lantus insulin with sliding scale  coverage on September 01, 2002.  She was transferred to  telemetry unit.  Initially on telemetry she was in sinus tachycardia but then converted to  sinus rhythm.  On telemetry she did not have significant ectopy nor did she  have any ST changes during this admission.   The patient's blood sugars continued to be elevated in the 300 range.  She  was covered with sliding scale insulin and begun on a regimen of NPH prior  to discharge with good results.  Blood sugar prior to discharge is 107.  She  will require close follow up for her diabetes.  Follow up appointment as  noted below.   Anion gap at the time of discharge is 9.  BUN 11, creatinine 0.7, sodium  135, potassium 3.7.  Vital si gins at the time of discharge:  temperature  98.0, blood pressure 112/58, heart rate 72, respirations 20, room air  saturations are 100%.   As noted, patient did have fluctuant mass in the left forearm thought to be  abscess.  After three days of IV antibiotics and warm compresses there was  no significant change in the status of this small abscess therefore,  surgical consult was requested with Dr. Chevis Pretty who did in fact I&D the  patient's arm at the bedside.  He packed this with  2x2 and gauze and  recommended daily dressing changes until the wound is healed appropriately.  The patient is discharged on antibiotics for another 1 1/2 days as noted.  Follow up as noted below.   Due to patient's underlying history of organic brain syndrome and her  limited intellectual capacity it is difficult to assess the true nature of  her drug abuse.  She did receive a consult by care management regarding drug  abuse.  The patient reports currently attending A.A. and N.A. meetings and  having a sponsor however, it is clearly evident from her drug screen that  the patient is still actively addicted.  She was encouraged to seek  community resources to help her overcome her addiction.   Again, as noted, because patient has limited intellectual capacity home  health nursing will be providing her dressing changes until they are no  longer required.   Patient was noted to be anemic on admission.  Anemia studies were performed.  Patient was found to have iron of 14 mcg/dl, total iron binding capacity of  240, percent saturation of 6 and ferritin of 40.  The patient is therefore  discharged on iron therapy which she should continue for 90 days and will  require periodic checks of her hemoglobin.  She is free of signs or symptoms  of active bleeding at the time of discharge.   The patient does have mild to moderate leukocytosis at the time of discharge  however, this is noted to be an improvement from her initial white blood  cell count of 22.7.  This is thought to be secondary to the infectious  process in her arm.  She will require follow up for this and is discharged  on antibiotics with dressing changes as noted below.   DISCHARGE LABORATORY:  White blood cell count 15.3, hemoglobin 9.5,  hematocrit 30.4, platelet count 283,000.  AST 31, ALT 17,  _________ 135,  total bilirubin 1.7, lipase 17.  Cardiac enzymes were negative.  Blood cultures were negative x2.   CONSULTATIONS:   Dr. Chevis Pretty, general surgery.   CONDITION ON DISCHARGE:  Good.    DISPOSITION:  Discharged to home with home health nursing for dressing  changes.  FOLLOW UP:  Patient has an appointment at [facility] on September 08, 2002  at 12:00 noon.     Ellender Hose. Earlene Plater, N.P.                    Lazaro Arms, M.D.    SMD/MEDQ  D:  09/05/2002  T:  09/05/2002  Job:  [id number]   cc:   [facility]

## 2010-12-02 NOTE — H&P (Signed)
[facility]  Patient:    [name], [name] Visit Number: [id number] MRN: [id number]          Service Type: MED Location: [id number] Attending Physician:  Ezequiel Kayser Dictated by:   Rodrigo Ran, M.D. Admit Date:  12/18/2001   CC:         Dineen Kid. Reche Dixon, M.D.   History and Physical  CHIEF COMPLAINT:  Nausea, malaise, headache, and abdominal pain.  HISTORY OF PRESENT ILLNESS:  Ms. Polo is a 45 year old female with polysubstance abuse, type 1 diabetes mellitus since age 70, and a history of treatment for syphilis and gonorrhea one year ago.  She was in her usual state of health until two days prior to admission when she developed nausea, vomiting, subjective fevers, headache, and abdominal pain.  She took no oral intake today, but does report taking her insulin this morning.  She presented to the emergency room by EMS and was found to be in diabetic ketoacidosis. She does report left ear pain for the last month and possible chronic infectious problems in that left ear.  She will require admission for further evaluation and treatment.  PAST MEDICAL HISTORY:  1. Type 1 diabetes since age 51.  2. DKA admission in June of 2002 due to pancreatitis, cocaine abuse, and     alcohol abuse.  3. 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  4. Reported history of some kind of previous left ear surgery.  5. History of unprotected sex with multiple sexual partners.  6. Hearing loss in the left ear.  7. Partial blindness.  8. History of appendectomy.  9. History of myocardial infarction at age 76. 22. History of seizures.  ALLERGIES:  No known allergies.  MEDICATIONS:  In the emergency room, she has been given 1 L of normal saline, 100 cc/hr saline, and Phenergan.  The only medications at home include a potassium supplement, a multivitamin with iron, Insulin NPH 20 units in the morning and 10 units in the evening, and Regular Insulin 15 units in  the morning and 10 units in the evening with supper.  SOCIAL HISTORY:  She lives in [place], [place], with her husband and her mom.  Her children live with her grandmother.  She has five children in all.  She does report tobacco use.  She says that she drinks one beer every two or three days and reports cocaine use three days ago, although there are other reports of cocaine use yesterday.  She denies any IV drug use.  FAMILY HISTORY:  Her mother is alive with thyroid problems.  Her father is alive and reportedly in good health.  She has two living brothers and one brother that died of unknown causes.  REVIEW OF SYSTEMS:  The patient did have one episode of syncope prior to before being brought in.  She has had nausea and headache.  She has had abdominal pain.  She reported that she was pregnancy when she reported to the emergency room, but this has not been confirmed in the emergency room.  She denies having a Glucometer at home.  She mostly has felt bad for the past two days.  She has been more somnolent.  She did report vomiting multiple times. However, she has had normal bowel movements, the last of which was about one hour ago.  There has been no blood from above or below noted.  PHYSICAL EXAMINATION:  Pulse 115, temperature 97.9 degrees, respiratory rate 20, blood pressure  114/73, 98% saturation on room air, blood sugars 344.  GENERAL APPEARANCE:  She is lying supine.  Speech is slurred, but she can give a relatively good history.  HEENT:  Normocephalic and atraumatic.  Pupils are equally round and reactive to light.  Extraocular movements are intact.  There is no icterus.  The oropharynx is clear.  The right tympanic membrane is normal.  The left tympanic membrane is scarred with some reddish blisters noted on the surface of the tympanic membrane.  NECK:  Supple.  There is no JVD.  CHEST:  Clear to auscultation bilaterally.  HEART:  Tachycardic with no  murmur.  ABDOMEN:  Soft, but diffusely tender with some guarding.  No mass or edema is noted.  LABORATORY DATA:  The ABG on room air shows a pH of 7.244, a pCO2 of 16.5, a pO2 of 118, and a bicarbonate of 7 with a 91% saturation on room air.  There are small blood ketones.  The sodium is 130, potassium 6.4, chloride 102, CO2 9, BUN 18, and creatinine 1.1.  The anion gap is 19.  The blood glucose by serum test is 333.  The total protein is elevated at 10.0 and the albumin is 5.0.  Otherwise the LFTs are normal.  The serum quantitative hCG test is negative.  The alcohol level is less than 5.  The lipase is normal at 13.  The amylase is normal at 48.  The troponin I is less than 0.01.  CK 67, CK-MB 0.5. PTT 25, INR 1.0.  She had a negative HIV test in June of 2002.  Today the white blood cell count is 5.8 with 77% segs, 19% lymphocytes, 4% monocytes, hemoglobin 13.6, and platelet count 351,000.  She had a urine protein electrophoresis in June of 2002 which showed polyclonal increase and free light chains, but no Bence Jones proteins.  The EKG showed sinus tachycardia and right atrial enlargement.  The pelvic ultrasound was negative.  The abdominal ultrasound was negative.  ASSESSMENT AND PLAN:  A 45 year old female with diabetic ketoacidosis.  The inciting cause is unclear at this time, but left-sided otitis media, viral syndrome, or some other intra-abdominal process are all possibilities.  She will need central access or better IV access in the emergency room.  Will attempt to place an external jugular vein IV.  Will admit to the transitional are unit.  Continue aggressive rehydration with IV normal saline.  Will continue on an IV insulin drip.  She will have q.1h. sugars.  We will check blood cultures and start intravenous Zosyn empirically to cover broadly for any bacterial cause for DKA.  She will remain NPO, except medications and sips of water for now.  We will recheck an HIV  antibiotic and a serum drug screen.  The patient is currently in serious condition.  We will place on ulcer and DVT prophylaxis as well. Dictated by:   Rodrigo Ran, M.D. Attending Physician:  Ezequiel Kayser DD:  12/18/01 TD:  12/20/01 Job: [id number]
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
## 2010-12-02 NOTE — H&P (Signed)
[facility]  Patient:    [name], [name] Visit Number: [id number] MRN: [id number]          Service Type: EMS Location: ED Attending Physician:  Devoria Albe Dictated by:   Jasmine Pang, M.D. Admit Date:  01/03/2002 Discharge Date: 01/03/2002   CC:         [facility]   Psychiatric Admission Assessment  DATE OF ADMISSION:  January 04, 2002  PATIENT IDENTIFICATION:  A 45 year old African-American female with a history of a chronic psychotic disorder.  HISTORY OF PRESENT ILLNESS:  The patient reports drinking two 34 ounce beers daily and using cocaine daily.  She states she has been hearing voices and "seeing spots and bugs all over the place."  She also has experienced an increase in mood instability including depression, irritability, and angry outbursts.  She has some neurovegetative symptoms including feelings of hopelessness and worthlessness and difficulty concentrating, anhedonia, and anergia.  PAST PSYCHIATRIC HISTORY:  The patient is treated at the [facility].  She was an inpatient at [facility] over a year ago.  She is currently on no psychiatric medications.  SUBSTANCE ABUSE HISTORY:  Positive alcohol and cocaine use as per history of present illness.  PAST MEDICAL HISTORY:  History of insulin-dependent diabetes mellitus, cataracts, deafness in left ear, tubal ligation 2003.  MEDICATIONS: 1. Insulin Regular 10 units a.m. 2. Insulin NPH 20 units q.a.m. 3. Insulin Regular 15 units q.p.m. 4. Insulin NPH 10 units q.p.m. 5. K-Dur 20 mEq q.d.  DRUG ALLERGIES:  No known drug allergies.  PHYSICAL EXAMINATION:  See physical examination done at [facility] ED.  SOCIAL HISTORY:  The patient lives in [place].  She has three children. She is unemployed.  She has a history of legal charges for shoplifting.  FAMILY PSYCHIATRIC HISTORY:  Unknown at this time.  ADMISSION MENTAL STATUS EXAMINATION:   The patient presented as friendly and cooperative African-American female with good eye contact.  She was casually dressed.  Speech was soft and slow.  She, at times, would make repetitive clicking noises with her tongue.  There was psychomotor retardation.  Mood was depressed.  Affect: Constricted.  She denied current suicidal or homicidal ideation.  She did state she was "seeing shadows and bugs."  She denied any auditory hallucinations at the time.  Thought processes revealed some perseveration about her recent worsening psychiatric condition.  Cognitive: She was overall alert and oriented to person, place, time, and reason for being in the hospital.  Short-term and long-term memory were diminished due to her current psychosis.  General fund of knowledge appeared to be age and education level appropriate.  Attention and concentration: Diminished due to psychosis.  Insight: Minimal.  Judgment: Poor.  ADMISSION DIAGNOSES: Axis I:    1. Psychotic disorder, not otherwise specified.            2. Alcohol dependence.            3. Cocaine dependence. Axis II:   Deferred. Axis III:  Diabetes. Axis IV:   Moderate. Axis V:    Global assessment of functioning is 20 upon admission, global            assessment of functioning 50 highest past year.  PROBLEMS:  Psychosis with recent destabilization and auditory and visual hallucinations.  SHORT-TERM TREATMENT GOAL:  Resolution of auditory and visual hallucinations.  LONG-TERM TREATMENT GOAL:  Stabilization of psychotic disorder so that the patient is able to function  well on an outpatient basis.  She will also stop using alcohol and cocaine, which are contributing to her destabilization.  INITIAL PLAN OF CARE:  Begin Risperdal 0.5 mg p.o. b.i.d.  Continue her other medications.  Begin unit therapeutic groups and activities.  Will see if there are any family members to access for a family session to provide support.  ESTIMATED LENGTH OF  STAY:  Five to seven days.  CONDITIONS NECESSARY FOR DISCHARGE:  No longer psychotic.  POST HOSPITAL CARE PLAN:  Return to her current living situation.  Followup psychiatric treatment at [facility].  FOLLOW-UP THERAPY AND MEDICATION MANAGEMENT: Dictated by:   Jasmine Pang, M.D. Attending Physician:  Devoria Albe DD:  01/06/02 TD:  01/06/02 Job: [id number]

## 2010-12-02 NOTE — Consult Note (Signed)
   [name], [name]                         ACCOUNT NO.:  [id number]   MEDICAL RECORD NO.:  [id number]                   PATIENT TYPE:  INP   LOCATION:  3710                                 FACILITY:  [facility]   PHYSICIAN:  Ollen Gross. Vernell Morgans, M.D.              DATE OF BIRTH:  Dec 12, 1965   DATE OF CONSULTATION:  DATE OF DISCHARGE:                                   CONSULTATION   REASON FOR CONSULTATION:  Chief complaint is a boil on the left arm.   HISTORY:  The patient is a 45 year old black female with a history of  insulin-dependent diabetes mellitus and substance abuse who was recently  admitted to the hospital in diabetic ketoacidosis. She was noted to have an  abscess on her left forearm that has not improved with antibiotics and warm  compresses and continues to be sore for her.   REVIEW OF SYMPTOMS:  She denies any fevers, chills, chest pain, shortness of  breath, diarrhea, dysuria. The rest of her review of systems is  unremarkable.   PAST MEDICAL HISTORY:  Significant for insulin-dependent diabetes, alcohol  abuse, drug abuse, anemia, multiple sexually transmitted diseases.   PAST SURGICAL HISTORY:  None.   MEDICATIONS:  At home includes insulin.   ALLERGIES:  No known drug allergies.   SOCIAL HISTORY:  She abuses alcohol and cocaine.   FAMILY HISTORY:  Noncontributory.   PHYSICAL EXAMINATION:  VITAL SIGNS:  She is afebrile with stable vitals.  GENERAL:  Well-developed, well-nourished black female in no acute distress.  SKIN:  Warm and dry with no jaundice.  HEENT:  Eyes extraocular muscles are intact.  LUNGS:  Clear bilaterally.  HEART:  Regular rate and rhythm.  ABDOMEN:  Soft and nontender.  EXTREMITIES:  She has a 1.5-2 cm small abscess on the left forearm. It is  not draining. Otherwise no cyanosis, clubbing, or edema.  PSYCHOLOGICAL:  She seems to be alert and oriented to person, place and  time.    ASSESSMENT AND PLAN:  This is a 27 year old drug  abuser with diabetes who  has an abscess on her left forearm that is not draining spontaneously. We  have been asked to drain this for her. I have explained this to the patient  including the risks and benefits as well as some of the technical aspects  and she understands and wishes to proceed. We will gather some supplies and  plan to do this for her this afternoon.                                               Ollen Gross. Vernell Morgans, M.D.    PST/MEDQ  D:  09/04/2002  T:  09/04/2002  Job:  [id number]
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
## 2010-12-02 NOTE — Discharge Summary (Signed)
[place]  Patient:    [name], [name]                      MRN: [id number] Adm. Date:  [id number] Disc. Date: [id number] Attending:  [name] Dictator:   Duncan Dull, M.D. CC:         [facility]   Discharge Summary  ADDENDUM  #4 - Increased total protein.  The patients total protein level was noted to be elevated at time of admission at 9.2.  A serum electrophoresis was drawn to look for evidence of multiple myeloma.  This was pending at the time of discharge, however, repeat testing of total protein after IV hydration showed a normal level at 6.9.  #5 - Microcytic anemia.  The patients hemoglobin was noted to be 11.2 after hydration.  There appeared to be no GI source for her anemia as she was hemoccult negative from below.  Most likely her anemia is secondary to menstrual and OB blood losses versus thallicemia minor.  However, given her history of alcohol use there may two concurrent anemias.  She should have B12 and ferritin levels as well as folate levels checked at follow-up.  LABORATORY DATA:  The patient had a complete abdominal ultrasound that showed no evidence of gallstones, gallbladder wall thickening, or biliary dilatation. Liver was within normal limits with no focal parenchymal lesions noted. The pancreas was unremarkable.  Chest x-ray at the time of admission was also obtained and was clear of acute disease with normal heart size.  At the time of discharge her CBC as follows; white blood cell count 3.4, hemoglobin 11.2, hematocrit 34.9, platelets 247, MCV 66.7.  Complete metabolic panel showed a sodium of 137, potassium 3.5, chloride 107, bicarb 23, BUN 8, creatinine 0.8, glucose 308.  UA at time of admission was negative for nitrites and leukocyte esterase and positive for 30 of protein.  HIV serology checked on June 11 was nonreactive.  Lipid panel checked on June 10 showed a cholesterol of 148,  triglycerides of 120, HDL of 57, and LDL of 67.  FOLLOW-UP:  The patient is to follow up with her primary care doctor at [facility] by early next week. DD:  12/27/00 TD:  12/27/00 Job: [id number]
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
## 2010-12-02 NOTE — Discharge Summary (Signed)
[name], [name]                           ACCOUNT NO.:  [id number]   MEDICAL RECORD NO.:  [id number]                    PATIENT TYPE:   LOCATION:                                       FACILITY:   PHYSICIAN:  Thayer Headings, M.D.               DATE OF BIRTH:  11/06/1959   DATE OF ADMISSION:  04/10/2002  DATE OF DISCHARGE:  04/13/2002                                 DISCHARGE SUMMARY   DISCHARGE DIAGNOSES:  1. Diabetic ketoacidosis, secondary to medicine noncompliance versus     pancreatitis.  2. Acute pancreatitis.  3. Chronic iron deficiency anemia.  4. Diabetes mellitus type 1 with history of multiple admissions for diabetic     ketoacidosis.  5. History of ________ externa.  6. Polysubstance abuse.   DISCHARGE MEDICATIONS:  1. NPH insulin 25 units q.a.m., 25 units q.p.m.  2. Regular insulin 5 units q.a.m., 5 units q.p.m.   DISPOSITION:  The patient will follow up at [facility].  She is to call  for an appointment when she gets home at that for her insulin dose should be  adjusted according to the number she is supposed to record prior to her  appointment.   HISTORY OF PRESENT ILLNESS:  Briefly, the patient is a 45 year old female  with diabetes mellitus type 1 who has chronically been noncompliant with her  medications and has had multiple admissions for DKA in the past.  She comes  in complaining of polyuria, polydipsia, weakness, nausea, vomiting,  abdominal  pain and blurry vision.  She says that she ran out of regular  insulin and needles several days ago.  She also complained of vaginal  itching and discharge, but during her workup here vaginal exam was performed  that turned out negative.  At the time of admission, she was fully oriented  with a 3/6 flow murmur on exam and some mild crackles in her lungs.  Her  abdomen was benign.  Significant in her laboratories, she had a bicarb of 10  and a creatinine of 1.5.  She had an anion gap of 22 and a lipase of  297.   HOSPITAL COURSE:  1. Diabetic ketoacidosis.  The patient was hydrated aggressively and started     on insulin drip.  She responded fairly quickly to these maneuvers and her     sugars returned to normal over the next 24 hours.  It is difficult to say     exactly what caused her to go into DKA although medication noncompliance     is the most likely reason considering that she had run of her regular     insulin.  Pancreatitis may also has helped to push her into the state.   1. Pancreatitis.  The patient was made n.p.o. when she arrived and she     became to feel better.  She asked for food to eat.  She  tolerated this     well.  She had minimal abdominal  pain.   1. Acute  renal insufficiency.  The patient's  creatinine was 1.5 at the     time of admission, but recovered quickly with fluids and was normal at     0.7 at the time of discharge.   1. Vaginal discharge.  No discharge was seen on the patient's pelvic exam.     It was noted that her uterus had prolapsed somewhat.  The fluid tested     during that exam showed a normal wet prep and negative GC and Chlamydia.   DISCHARGE LABORATORIES:  WBC 4.0, hemoglobin 9.6, platelets 248, sodium 135,  potassium 4.1, chloride 105, CO2 26, glucose 204, BUN 11, creatinine 0.7.  Of note, echocardiogram was performed during this hospitalization for her  heart murmur.  This showed an ejection fraction of 50% and no valvular  abnormalities.                                               Thayer Headings, M.D.    BM/MEDQ  D:  06/23/2002  T:  06/23/2002  Job:  [id number]   cc:   [facility]   [facility]

## 2010-12-02 NOTE — Discharge Summary (Signed)
NAMEJOELINE, [name]               ACCOUNT NO.:  [id number]   MEDICAL RECORD NO.:  [id number]          PATIENT TYPE:  INP   LOCATION:  1610                         [facility]ACILITY:  [facility]   PHYSICIAN:  Elliot Cousin, M.D.    DATE OF BIRTH:  11/06/1968   DATE OF ADMISSION:  05/29/2005  DATE OF DISCHARGE:  05/31/2005                                 DISCHARGE SUMMARY   DISCHARGE DIAGNOSES:  1.  The patient has 2 different medical record numbers with 2 different      names. The alternate spelling of the patient's name is [name],      medical record number [id number].  2.  Gastroenteritis.  3.  Mild metabolic acidosis thought to be secondary to diarrhea.  4.  Hypokalemia.  5.  Acute renal failure secondary to prerenal azotemia.  6.  Type 1 diabetes mellitus.  7.  Eczema.  8.  Elevated blood pressure on admission, normalized to within normal limits      prior to hospital discharge.   DISCHARGE MEDICATIONS:  1.  NPH insulin 20 units in the morning and 10 units in the evening.  2.  Regular insulin 10 units in the morning and 10 units in the evening.  3.  Multivitamin with iron once daily.  4.  Elocon ointment applied to skin daily.   DISPOSITION:  The patient was discharged to home in improved and stable  condition today. She was advised to followup with the [facility] in  5-7 days.   HISTORY OF PRESENT ILLNESS:  The patient is a 45 year old lady with a past  medical history significant for type 1 diabetes mellitus, noncompliance, and  diet controlled hypertension, who presented to the emergency department on  May 29, 2005 with a chief complaint of nausea, vomiting and diarrhea.  The patient was therefore admitted for further evaluation and management.  For additional details, please see the dictated history and physical.   HOSPITAL COURSE:  #1.  GASTROENTERITIS.  On admission, the patient was  afebrile and mildly hypertensive. Her pulse rate was 128. She was  oxygenating 95% on room air. Her lab data on admission were significant for  urine glucose greater than 1000, bicarbonate level slightly low at 18, BUN  elevated at 44, and creatinine elevated at 1.5. For further evaluation,  pancreatic enzymes were ordered. The amylase was elevated at 257 and the  lipase was within normal limits at 37. An acute abdominal series was ordered  as well and was within normal limits. For treatment, the patient was started  on volume repletion with D5 normal saline with potassium chloride added.  Nausea was treated with Reglan 5 mg every 6 hours. Phenergan as needed was  also added. The patient was made n.p.o. initially, however, later on in the  day on the day of admission, the patient's symptoms subsided and she was  therefore started on a clear liquid diet. The following day, the patient's  diet was advanced to a full liquid diet. She tolerated the advancement in  diet well. Eventually she was started on a moderate carbohydrate modified  diet.   During the hospital course, the patient experienced no episodes of nausea,  vomiting, diarrhea or abdominal pain. Stool studies were ordered, however,  the patient was unable to provide a stool specimen. Of note, a urine  pregnancy test was ordered and was negative. Her urinalysis was negative for  signs of infection. The patient is symptomatically improved and stable and  will therefore be discharged to home today.   #2.  ACUTE RENAL INSUFFICIENCY SECONDARY TO PRERENAL AZOTEMIA.  The  patient's BUN on admission was 44 and her creatinine was 1.5. Following  volume repletion, the BUN improved to 9 and the creatinine improved to 0.8.   #3.  MILD METABOLIC ACIDOSIS.  The patient's bicarbonate level was slightly  low at 18. The anion gap was slightly elevated at 14. The patient's  capillary blood sugar was within normal limits at 98. The metabolic acidosis  was not secondary to DKA. Rather, the mild metabolic acidosis  was probably  secondary to diarrhea plus/minus acute renal insufficiency. Following volume  repletion, the bicarbonate normalized to 23.   #4.  HYPOKALEMIA.  The patient's serum potassium fell to 3.0 during the  hospital course, prior to hospital discharge. The day before, her serum  potassium was 4.0. A magnesium level was assessed and found to be within  normal limits. The patient was given 30 mEq of potassium chloride this  morning and will receive another 20 mEq this afternoon prior to hospital  discharge. On admission, the patient's serum potassium was 3.9.   #5.  TYPE 1 DIABETES MELLITUS.  The patient's capillary blood sugars were  well controlled up until today. The patient began eating much better and her  capillary blood sugars increased accordingly. The patient was restarted on  NPH insulin with a sliding scale insulin regimen q.a.c. and q.h.s. She was  advised to return to her usual home dose of NPH and regular insulin  following hospital discharge.   #6.  ECZEMA.  The patient complained of dry itchy skin which appeared to be  consistent with eczema. She was started on Elocon ointment twice daily. The  skin dryness improved. The patient was advised to continue Elocon ointment  as needed.   DISPOSITION:  The patient was discharged to home. She will followup with  [facility] in 1-2 weeks. The patient mentioned that she ran out of insulin  and insulin strips. She receives Medicaid but had no money and no  transportation home. The case management/social work team were consulted to  assist with medications and transportation home.      Elliot Cousin, M.D.  Electronically Signed     DF/MEDQ  D:  05/31/2005  T:  05/31/2005  Job:  [id number]

## 2010-12-02 NOTE — H&P (Signed)
NAMEARLESIA, KIEL                         ACCOUNT NO.:  [id number]   MEDICAL RECORD NO.:  [id number]                   PATIENT TYPE:  INP   LOCATION:  1823                                 FACILITY:  [facility]   PHYSICIAN:  Pearlean Brownie, M.D.            DATE OF BIRTH:  [birth year]   DATE OF ADMISSION:  07/27/2003  DATE OF DISCHARGE:                                HISTORY & PHYSICAL   REFERRING PHYSICIAN:  None.   CONSULTING PHYSICIAN:  None.   PRIMARY CARE PHYSICIAN:  [facility]   CHIEF COMPLAINT:  Nausea and vomiting.   HISTORY OF PRESENT ILLNESS:  The patient is an [place] American female with  a history of type 1 diabetes and a history of alcohol and cocaine  dependence.  She presents with recent onset of nausea and vomiting x2 hours.  The patient is otherwise unable to give any history.  After reviewing past  medical records, it appears that the patient has a history of noncompliance  with diabetes medications.   PAST MEDICAL HISTORY:  1. 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  2. History of DKA.  3. History of hypothyroidism.  4. History of cocaine and alcohol abuse and dependence.  5. History of left neck abscess in August with MRSA.  6. History of PTSD.  7. History of auditory hallucinations.  8. History of depressive disorder.  9. History of chronic organic brain syndrome.  10.      History of questionable endometriosis versus retained products of     conception.   PAST SURGICAL HISTORY:  1. Status post cataract surgery.  2. Status post appendectomy.  3. Status post ear surgery.  4. Status post therapeutic abortion in June of 2003.   CURRENT MEDICATIONS:  Current medications unknown secondary to the patient's  inability to state.   ALLERGIES:  No known drug allergies.   FAMILY HISTORY:  Unable to obtain.   SOCIAL HISTORY:  Social history positive for cocaine use, alcohol use and  tobacco use.   REVIEW OF SYSTEMS:  Unable to obtain.   PHYSICAL EXAMINATION:  VITALS:  Temperature 99.1, pulse 112, respiratory  rate 24, BP 147/65, O2 is 99% on room air.  GENERAL:  In general, she is vomiting intermittently, incoherent and  obviously in discomfort.  CV:  Regular rate and rhythm with no murmurs, rubs, or gallops.  LUNGS:  Lungs are clear to auscultation bilaterally with very poor effort.  ABDOMEN:  Abdomen was diffusely tender with questionable distention,  positive bowel sounds, yet hypoactive.  HEENT:  Dry mucous membranes.  No palpable masses or nodes.  EXTREMITIES:  No edema.  NEUROLOGIC:  Cranial nerves II-XII grossly intact.  The patient overall is  not very responsive.   ADMISSION LABORATORIES:  CBC:  White cell count 7.1, hemoglobin and  hematocrit 13.3 and 41.5, platelets 390,000.  Sodium 131, potassium 5.1, BUN  16, sugar 447.  I-STAT shows pH of 7.2,  PCO2 of 32.1 and a bicarb of 12.5.  UA:  Specific gravity 1.025, pH 5.0, glucose of greater than 1000, ketones  greater than 80, protein 30, negative nitrites, negative LE, rare bacteria.  Urine pregnancy negative.  Urine drug screen positive for cocaine.  Amylase  67, lipase 13, AST 43, ALT 20, alkaline phosphatase 93, total bilirubin 0.7.  Hemoccult of emesis and stool negative.   ASSESSMENT AND PLAN:  This is a 45 year old African American female with  diabetes mellitus, type 1, with nausea and vomiting.   1. Diabetes mellitus:  The patient has a bicarbonate of 12.5 with an anion     gap of 13.5 on i-STAT and obvious signs of dehydration.  Her capillary     blood glucoses are in the upper 400s.  I will start aggressive     intravenous hydration and the patient is already currently on a     Glucomander; I will continue this.  I will follow her BMETs every 2 hours     initially and then space out further.  As far as an etiology to her     diabetic ketoacidosis, we will need to consider infectious cause and also     cardiac, given her cocaine use.  I will get  cardiac enzymes and an     electrocardiogram.  I believe that this is most likely secondary to     noncompliance with the patient's home diabetes regimen.  2. Nausea and vomiting:  Etiology for this patient is vast and difficult to     say, given lack of history.  Need to consider infectious cause, however,     the patient has a normal complete blood count.  The most likely cause is     her diabetic ketoacidosis.  I will keep the patient n.p.o. and follow.  I     suspect her nausea and vomiting will improve as her blood sugars drop.  3. Abdominal pain most likely related to #1.  Amylase and lipase are within     normal limits.  We will follow as we correct her diabetic ketoacidosis.     We will keep patient n.p.o.  4. Cocaine use:  Patient with long history of abuse.  We will cycle cardiac     enzymes and get an EKG.  5. Alcohol use:  We will get blood alcohol level.  We will monitor for signs     of withdrawal.  We will give an order for Ativan p.r.n. and place the     patient on seizure precautions.  6. Social:  Patient with a history of being homeless and I am unsure of her     current situation.  I will get a social work consult.  7. Fluids, electrolytes, and nutrition/gastrointestinal:  We will start     Protonix, keep the patient n.p.o. and give intravenous fluids.  8. Deep venous thrombosis prophylaxis:  We will encourage ambulation.      Penni Bombard, MD                          Pearlean Brownie, M.D.    SJ/MEDQ  D:  07/27/2003  T:  07/27/2003  Job:  [id number]

## 2010-12-02 NOTE — Consult Note (Signed)
NAMEKIMARI, LIENHARD                         ACCOUNT NO.:  [id number]   MEDICAL RECORD NO.:  [id number]                   PATIENT TYPE:  INP   LOCATION:  6713                                 FACILITY:  [facility]   PHYSICIAN:  Meade Maw, M.D.                 DATE OF BIRTH:  1966-06-17   DATE OF CONSULTATION:  DATE OF DISCHARGE:                                   CONSULTATION   REFERRING PHYSICIAN:  Ileana Roup, M.D.   INDICATION FOR CONSULT:  Evaluation for endocarditis.   HISTORY:  Mikiya is a 45 year old African American female who was initially  admitted to the hospital on March 07, 2003, with a left abscess on her  neck.  She was subsequently found to have MRSA parotitis.  Infectious  disease was consulted.  They wished for TEE for further evaluation for  possible valvular endocarditis.  The patient has known uncontrolled diabetes  and history of drug abuse.  She has demonstrated transient bacteremia.   PAST MEDICAL HISTORY:  Significant for:  1. Insulin-dependent diabetes.  2. Substance abuse.  3. Psychotic disorders.  4. Hypothyroidism.  5. Tobacco abuse.  6. DKA.  7. Questionable thalassemia.   MEDICATIONS PRIOR TO ADMISSION:  None.   CURRENT MEDICATIONS:  Insulin, Cipro eyedrops, ibuprofen, vitamin B1,  vancomycin, and Benadryl p.r.n.   ALLERGIES:  She has no known drug allergies.   PAST SURGICAL HISTORY:  1. Status post cataract surgery.  2. Appendectomy.   SOCIAL HISTORY:  She is homeless.   REVIEW OF SYSTEMS:  She has had no chest pain, no dyspnea, no orthopnea, and  no presyncope.  She has had mild transient dizziness.   PHYSICAL EXAMINATION:  GENERAL APPEARANCE:  A middle-aged female.  Alert and  cooperative.  VITAL SIGNS:  The blood pressure is 138/68, heart rate 74, respiratory rate  20, and she is afebrile.  She has had no telemetry.  No EKG on the chart.  NECK:  There is no neck vein distention.  There is a left parotid abscess.  LUNGS:   Clear to auscultation.  CARDIOVASCULAR:  Regular rate and rhythm.  There is a flow murmur noted at  her left sternal border.  The PMI is not displaced.  ABDOMEN:  Benign.  Normal bowel sounds.  EXTREMITIES:  No peripheral edema.  SKIN:  No evidence of embolic disease.   LABORATORY DATA:  White count 6.5, hematocrit 28, platelet count 343.  Potassium 3.6, creatinine 0.7.  Blood cultures reveal MRSA on March 07, 2003.   IMPRESSION:  A 45 year old [place] American female with polysubstance abuse  and bacteremia.  She has poor control of diabetes.  Therefore, she is at  increased risk for endocarditis.  There are no physical findings to  correlate with this possibility.  A transesophageal echocardiogram will be  obtained for further evaluation.  We will also obtain a 12-lead EKG for  baseline.                                               Meade Maw, M.D.    HP/MEDQ  D:  03/16/2003  T:  03/16/2003  Job:  [id number]
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
## 2010-12-02 NOTE — H&P (Signed)
NAMEJENNEL, MARA               ACCOUNT NO.:  [id number]   MEDICAL RECORD NO.:  [id number]          PATIENT TYPE:  EMS   LOCATION:  ED                           [facility]ACILITY[facility]  [facility]   PHYSICIAN:  Elliot Cousin, M.D.    DATE OF BIRTH:  11/06/1968   DATE OF ADMISSION:  05/29/2005  DATE OF DISCHARGE:                                HISTORY & PHYSICAL   PRIMARY CARE PHYSICIAN:  [facility].   CHIEF COMPLAINT:  Abdominal pain, nausea, vomiting, and diarrhea.   HISTORY OF PRESENT ILLNESS:  Please note that Ms. Magley has been admitted  to the hospital under a different name/alternate spelling of her name,  [place], first name [name], medical record [phone].  Ms. Reindel is a 45-year-  old lady with a past medical history significant for type 1 diabetes  mellitus, diet controlled hypertension, and noncompliance, who presents to  the emergency department with a 2 day history of abdominal pain, nausea,  vomiting, and diarrhea.  The abdominal pain is diffuse.  At its worst, it is  rated as an 8/10 in intensity.  Currently it is a 1/10 in intensity after  receiving Dilaudid in the emergency department.  The pain does not radiate  to the back nor to the groin.  Over the past 24 hours, the patient has had 4-  5 episodes of vomiting.  No hematemesis.  She has also had 5-6 episodes of  loose/watery diarrheal stools.  No black, tarry stools and no bright red  blood per rectum.  The patient has been exposed to a couple of sick  contacts.  Her husband is not sick, however.  No recent history of  antibiotic therapy.  No recent history of eating restaurant foods.  She has  had subjective fever but no chills.  No dysuria.  She cannot recall the date  of her last menstrual period.  No abnormal vaginal bleeding.   During the evaluation in the emergency department, the patient was noted to  be afebrile. Her blood pressure is elevated at 144/111.  She is oxygenating  95% on room air.  She is  tachycardic with a heart rate of 128.  Her lab data  are significant for an amylase of 257, lipase of 37, BUN of 44, and a  creatinine of 1.5.  The patient will be admitted for further evaluation and  management.   PAST MEDICAL HISTORY:  1.  Type 1 diabetes mellitus, diagnosed at 45 years old.  2.  Hospitalization in March 2006, secondary to hypoglycemia.  3.  Diet controlled hypertension.  4.  Question cataract history and history of cataract excisions.  5.  Question history of glaucoma.  6.  Status post I&D of left 4th digit abscess on the hand.  7.  History of previous cutaneous skin Staphylococcus infection.  8.  History of previous claims of pregnancy with multiple negative pregnancy      tests.  9.  Question psychiatric disorder.  10. History of noncompliance.  11. Eczema.  12. Acne.  13. Tobacco use.  14. Cocaine abuse.   MEDICATIONS:  1.  Humulin N 20 units in the morning and 30 units in the evening.  2.  Novolin R question dose twice daily.   ALLERGIES:  No known drug allergies.   SOCIAL HISTORY:  The patient is married.  She lives in [place] with her  husband in an apartment.  Previously, she was homeless.  She has 5 children  in all.  She does not have custody of any children.  She smokes 1-2  cigarettes every other day.  She denies alcohol use.  She smokes crack  cocaine several times weekly.  She receives disability.   FAMILY HISTORY:  Her mother is 45 years of age and has no known health  problems.  Her father is alive; health is unknown.  She does have one  brother who is diabetic.   REVIEW OF SYSTEMS:  The patient's review of systems is positive for itching  of her skin diffusely, dry skin, acne rash.   PHYSICAL EXAMINATION:  VITAL SIGNS:  Temperature 97.5, blood pressure  144/111, repeated at 172/107, pulse 128, respiratory rate 20, oxygen  saturation 95% on room air.  GENERAL:  The patient is a somewhat sedated 45 year old African-American  woman, who  is currently lying in bed in no acute distress.  She received  intravenous Dilaudid 2 hours ago.  HEENT:  Head is normocephalic, atraumatic.  Pupils are equal, round, and  reactive to light.  Extraocular movements are intact.  Conjunctivae are  clear.  Sclerae are white.  Tympanic membrane on the left is mildly obscured  by cerumen.  Otherwise, no acute changes seen.  Tympanic membrane on the  right is clear.  Oropharynx:  The patient has multiple missing teeth.  Mucous membranes are dry.  No posterior exudates or erythema.  NECK:  Supple with no adenopathy, no thyromegaly, no bruit, no JVD.  LUNGS:  Clear to auscultation bilaterally.  HEART:  S1, S2 with tachycardia.  ABDOMEN:  Hypoactive bowel sounds, soft, mildly tender diffusely.  No  distention.  No hepatosplenomegaly, no rebound, no voluntary guarding.  RECTAL/GU:  Deferred.  EXTREMITIES:  Pedal pulses are 2+ bilaterally.  No pretibial edema.  No  pedal edema.  SKIN:  The patient has diffusely dry skin.  She has acne, whiteheads, and  blackheads on her face and her upper chest.  She has a few excoriated  scratch marks on her chest wall.  No drainage or diffuse erythema.  NEUROLOGIC:  The patient is sedated and mildly lethargic, status post  Dilaudid.  She is alert when aroused.  She is oriented x 3.  Cranial nerves  2-12 are intact.  Strength is 5/5 throughout.  Sensation is intact  throughout.   ADMISSION LABORATORY DATA:  Urine drug screen is pending.  Urinalysis:  Urine glucose greater than 1000, urine bilirubin moderate, urine ketones 15,  urine protein 100, urine nitrite negative, urine leukocyte esterase  negative, urine WBC 0-2.  Sodium 137, potassium 3.9, chloride 105, CO2 18,  glucose 98, BUN 44, creatinine 1.5, calcium 9.9, total protein 9.3, albumin  4.4, AST 17, ALT 10, alkaline phosphatase 73, total bilirubin 0.8, lipase  37, amylase 257, WBC 5.7, hemoglobin 15.2, hematocrit 48.7, platelets 430.  ASSESSMENT:  1.   Diffuse abdominal pain, nausea, vomiting, and diarrhea.  The patient has      a viral versus bacterial gastroenteritis.  She is currently afebrile and      does not have leukocytosis.  We will hold on empiric antibiotic      treatment  for now.  2.  Acute renal insufficiency.  The patient's acute renal insufficiency is      probably secondary to prerenal azotemia/volume depletion.  3.  Elevated amylase with a lipase within normal limits.  Question mild      pancreatitis.  The patient's amylase may be secondary to volume      depletion.  4.  Elevated blood pressure with a history of hypertension.  The patient is      not being treated chronically with an antihypertensive medication.  This      may be secondary to her history of noncompliance.  However, rather than      starting an antihypertensive medication now, we will follow blood      pressures throughout the next 24-48 hours.  If the blood pressure is      still elevated and her renal function improves, we will consider adding      an ACE inhibitor.  5.  Eczema/acne.  6.  Type 1 diabetes mellitus with a history of hypoglycemia and      noncompliance in the past.  The patient's capillary blood sugar is      currently 98.  7.  Mild metabolic acidosis. The anion gap is marginally elevated. Doubt      DKA, but rather acidosis secondary to diarrhea.   PLAN:  1.  The patient will be admitted for further evaluation and management.  2.  We will give a bolus of normal saline 500 mL and then start D5 normal      saline with potassium chloride added at 125 mL/hr.  3.  Check the patient's CBGs q.6h.  We will add small doses of NPH and a      sliding-scale insulin regimen.  Titrate up accordingly.  4.  Bowel rest.  5.  Collect stool specimens for C. difficile, routine culture and      sensitivity, and ova & parasite studies.  6.  Follow blood pressures and if they continue to be elevated, consider      adding ACE inhibitor or calcium channel  blocker.  7.  Start Eucerin lotion and a topical steroid for eczema.  8.  Tobacco cessation counseling and substance abuse counseling.  9.  We will check an urine pregnancy test.      Elliot Cousin, M.D.  Electronically Signed     DF/MEDQ  D:  05/29/2005  T:  05/29/2005  Job:  [id number]

## 2010-12-20 ENCOUNTER — Ambulatory Visit: Payer: Medicaid Other | Admitting: Internal Medicine

## 2010-12-28 ENCOUNTER — Ambulatory Visit: Payer: Medicaid Other | Admitting: Internal Medicine

## 2011-01-03 ENCOUNTER — Ambulatory Visit (INDEPENDENT_AMBULATORY_CARE_PROVIDER_SITE_OTHER): Payer: Medicaid Other | Admitting: Internal Medicine

## 2011-01-03 ENCOUNTER — Encounter: Payer: Self-pay | Admitting: Internal Medicine

## 2011-01-03 ENCOUNTER — Telehealth: Payer: Self-pay | Admitting: *Deleted

## 2011-01-03 VITALS — BP 116/72 | HR 84 | Temp 97.7°F | Wt 145.5 lb

## 2011-01-03 DIAGNOSIS — E109 Type 1 diabetes mellitus without complications: Secondary | ICD-10-CM

## 2011-01-03 DIAGNOSIS — I10 Essential (primary) hypertension: Secondary | ICD-10-CM

## 2011-01-03 DIAGNOSIS — E785 Hyperlipidemia, unspecified: Secondary | ICD-10-CM

## 2011-01-03 DIAGNOSIS — E039 Hypothyroidism, unspecified: Secondary | ICD-10-CM

## 2011-01-03 LAB — POCT GLYCOSYLATED HEMOGLOBIN (HGB A1C): Hemoglobin A1C: 9.4

## 2011-01-03 MED ORDER — INSULIN LISPRO 100 UNIT/ML ~~LOC~~ SOLN: SUBCUTANEOUS | Status: DC

## 2011-01-03 MED ORDER — INSULIN GLARGINE 100 UNIT/ML ~~LOC~~ SOLN: 16.0000 [IU] | Freq: Every day | SUBCUTANEOUS | Status: DC

## 2011-01-03 NOTE — Assessment & Plan Note (Signed)
The patient is on no medication and blood pressure were controlled. Patient had episodes in the past for her blood pressure is low.  BP Readings from Last 3 Encounters:  01/03/11 116/72  10/05/10 91/64  09/01/10 112/72

## 2011-01-03 NOTE — Progress Notes (Signed)
  Subjective:    Patient ID: [name], female    DOB: May 25, 1966, 45 y.o.   MRN: [id number]  HPI This is a 45 year old female with past history significant for bipolar disorder, mild cognitive impairment, diabetes who presented to the clinic for high blood sugar level. Patient called the clinic notified that her blood glucose level are in the 400s and the institute where she lives( [facility]) is not giving her any insulin. She denies any chest pain, shortness of breath, abdominal pain, changes in urination, weight loss. Her records from [facility]: Patient has been getting her Lantus on a daily basis. She needs meal coverage 3 times a day. Sometimes she would not get any coverage for dinner since she's not at that facility. She does go out would not comply with diet at that time. She would come late around 10:00 o'clock back to the facility and at that time she would just received Lantus. The is what  she is implying to not receiving any insulin.  Her Blood glucose level per recs from [facility]: 7 am lows between 51-75, patient has no symptoms with low blood sugars. Otherwise 7 am Range 51-441 11am  Range 128-400 4 pm 143-449 8 pm 126-520   Review of Systems  Constitutional: Negative for fever, chills and fatigue.  Eyes: Negative for visual disturbance.  Respiratory: Negative for chest tightness and shortness of breath.   Cardiovascular: Negative for chest pain, palpitations and leg swelling.  Gastrointestinal: Negative for nausea, abdominal pain, diarrhea, constipation and abdominal distention.  Genitourinary: Negative for difficulty urinating.  Musculoskeletal: Negative for arthralgias.  Neurological: Negative for dizziness and weakness.       Objective:   Physical Exam  Constitutional: She is oriented to person, place, and time. She appears well-developed.  HENT:  Head: Normocephalic.  Neck: Neck supple.  Cardiovascular: Normal rate, regular rhythm and  normal heart sounds.   Pulmonary/Chest: Effort normal and breath sounds normal.  Abdominal: Soft. Bowel sounds are normal. She exhibits no distension. There is no tenderness.  Musculoskeletal: Normal range of motion.  Neurological: She is alert and oriented to person, place, and time.  Psychiatric: Her speech is normal. Thought content is delusional. She expresses impulsivity.          Assessment & Plan:

## 2011-01-03 NOTE — Assessment & Plan Note (Signed)
Patient was noted to have high TSH since 2009 with a max of 8.9. Her last TSH 5 month ago was 4.9 and a total T4 was 8.2. Patient is on no medication. We will check TSH and free T4 today.

## 2011-01-03 NOTE — Assessment & Plan Note (Signed)
Patient's hemoglobin A1c today is 9.4 which is up from 8.2 in September of 2011. Patient was noted to have significant elevated blood sugars with some few low blood sugars in the morning. It was noted that her weight has significantly increased from February to now a 9 pound increase. Speaking with the facility it was mentioned that she does not follow any dietary instruction whenever she goes out. She was so has a lot of snacks at the facility which she buys by herself. It seems that the patient needs correction insulin at mealtime. It was discussed with [name] during this office visit. We'll decrease Lantus to 16 units from 17, we will change  the regular meal coverage to 4 units 3 times a day instead of 4 units in the morning and 6 units for lunch and supper. At the same time would provide the facility in meal coverage correction table. Furthermore noted the patient has not allowed to have any snacks. An order was placed for the facility. The higher blood sugars are most likely due to increased by mouth intake. Other differential diagnoses include a UTI the patient is not complaining about any symptoms and drug use and or alcohol use. I want to obtain urine for a UDS but patient was not able to give me a specimen at this office visit. A serum drug screen was also not performed since patient refused to get blood drawn after 2 unsuccessful attempts. We'll have her seen in 2 weeks to evaluate her blood sugars at that time. The new list of medication and prescription was faxed to [facility] care facility.

## 2011-01-03 NOTE — Telephone Encounter (Signed)
Pt calls from [facility] that she resides in and states her cbg's have been hi for 1 month, she states the staff at [facility] have not notified int med and she is concerned. She is given an appt for this pm and ask to have staff to bring either all her meds or a list of meds given for past month, her cbg's for last 2 weeks and to be in clinic at 1430, she ask me to call chris who works at [facility] and inform him of these needs and the need to be here today at 1430. i called and spoke to someone named chris, at first he said no that he could not do that when told that was the only time pt could be seen today he finally agreed and stated he would make sure pt brought the med list and cbg's for the past 2 weeks. Pt states her cbg's have consistently been in hi 400's.

## 2011-01-03 NOTE — Patient Instructions (Signed)
HOLD all snacks.

## 2011-01-03 NOTE — Assessment & Plan Note (Signed)
I wanted to check fasting lipid panel today but  the patient refused after 2 attempts with blood draw. We'll continue current regimen and will try at the next office visit.

## 2011-01-03 NOTE — Telephone Encounter (Signed)
Agree with plan, needs eval.

## 2011-01-04 LAB — TSH: TSH: 4.851 u[IU]/mL — ABNORMAL HIGH (ref 0.350–4.500)

## 2011-01-05 ENCOUNTER — Other Ambulatory Visit: Payer: Self-pay | Admitting: Dietician

## 2011-01-05 DIAGNOSIS — E109 Type 1 diabetes mellitus without complications: Secondary | ICD-10-CM

## 2011-01-05 MED ORDER — INSULIN LISPRO 100 UNIT/ML ~~LOC~~ SOLN: SUBCUTANEOUS | Status: DC

## 2011-01-05 NOTE — Telephone Encounter (Signed)
Spoke with [name] several times to explain Humalog insulin and reinforce need for fewer snacks. Clarificaftion of Humalog order faxed to [facility] with 4 units Humalog with each meals crossed out and the word "correction" changed to "Humalog"  at mealtimes. Patient blood sugar was 289 before lunch today.  [name] verbalized understanding by telling me that she read the new orders for Humalog  that she should give 7 units.

## 2011-01-17 ENCOUNTER — Ambulatory Visit (INDEPENDENT_AMBULATORY_CARE_PROVIDER_SITE_OTHER): Payer: Medicaid Other | Admitting: Internal Medicine

## 2011-01-17 ENCOUNTER — Encounter: Payer: Self-pay | Admitting: Internal Medicine

## 2011-01-17 VITALS — BP 101/71 | HR 100 | Temp 97.9°F | Ht 60.0 in | Wt 140.0 lb

## 2011-01-17 DIAGNOSIS — D649 Anemia, unspecified: Secondary | ICD-10-CM

## 2011-01-17 DIAGNOSIS — F99 Mental disorder, not otherwise specified: Secondary | ICD-10-CM

## 2011-01-17 DIAGNOSIS — F489 Nonpsychotic mental disorder, unspecified: Secondary | ICD-10-CM

## 2011-01-17 DIAGNOSIS — Z0000 Encounter for general adult medical examination without abnormal findings: Secondary | ICD-10-CM | POA: Insufficient documentation

## 2011-01-17 DIAGNOSIS — A150 Tuberculosis of lung: Secondary | ICD-10-CM

## 2011-01-17 DIAGNOSIS — F1910 Other psychoactive substance abuse, uncomplicated: Secondary | ICD-10-CM

## 2011-01-17 DIAGNOSIS — Z23 Encounter for immunization: Secondary | ICD-10-CM

## 2011-01-17 DIAGNOSIS — Z227 Latent tuberculosis: Secondary | ICD-10-CM

## 2011-01-17 DIAGNOSIS — E109 Type 1 diabetes mellitus without complications: Secondary | ICD-10-CM

## 2011-01-17 DIAGNOSIS — E785 Hyperlipidemia, unspecified: Secondary | ICD-10-CM
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
## 2011-01-17 NOTE — Assessment & Plan Note (Signed)
Pt states had pap recently at [facility] hospital but can't find results in echart.

## 2011-01-17 NOTE — Assessment & Plan Note (Signed)
[name] denies seeing mental health although my previous notes and Dr Clabe Seal notes all indicate that she is followed by mental health.

## 2011-01-17 NOTE — Assessment & Plan Note (Signed)
Pt did not have list of CBG's from facility. Was upset that she was no longer receiving snacks (potatoe chips, crackers, PB). Did not receive insulin this AM. Reports that pt leaves facility for lengthy periods and misses meals and then insulin and CBG's get off sch. A1C elevated at 9.4.   [name] called facility to discuss and get log.

## 2011-01-17 NOTE — Assessment & Plan Note (Signed)
Last LDL at goal but will need FLP in next couple of months. At last visit stuck twice unsuccessfully so will wait till next visit to check.

## 2011-01-17 NOTE — Assessment & Plan Note (Addendum)
Will need to check ferritin and CBC next lab draw.

## 2011-01-19 ENCOUNTER — Emergency Department (HOSPITAL_COMMUNITY)
Admission: EM | Admit: 2011-01-19 | Discharge: 2011-01-19 | Discharge status: Against Medical Advice | Payer: Medicaid Other | Attending: Emergency Medicine | Admitting: Emergency Medicine

## 2011-01-19 DIAGNOSIS — E1169 Type 2 diabetes mellitus with other specified complication: Secondary | ICD-10-CM | POA: Insufficient documentation

## 2011-01-19 DIAGNOSIS — Z794 Long term (current) use of insulin: Secondary | ICD-10-CM | POA: Insufficient documentation

## 2011-01-19 LAB — GLUCOSE, CAPILLARY

## 2011-02-01 ENCOUNTER — Telehealth: Payer: Self-pay | Admitting: Dietician

## 2011-02-01 NOTE — Telephone Encounter (Signed)
Called [name] at [facility] to follow up on ER visit for low blood sugar: note patient was reportedly givin insulin prior to checking blood sugar. Unable to reach her or leave message. (On hold for > 10 minutes) Called back and was disconnected after phone answered.   Called back a third time and spoke with [place]- med Print production planner at [facility] was checking blood sugar and giving insulin at 6 AM, then food was not being served until 7:30 -8 am. They have moved her morning blood sugar check and insulin to 7-7:30 and this has eliminated her "crashing" before breakfast. [name] reports patient eating well, goes out looking for apartments with her biggest complaint that she doesn't want to live at [facility]. [name] to fax insulin administration and blood sugar records to [place] today for review.

## 2011-02-16 ENCOUNTER — Emergency Department (HOSPITAL_COMMUNITY)
Admission: EM | Admit: 2011-02-16 | Discharge: 2011-02-16 | Disposition: A | Discharge status: Home / Self Care | Payer: Medicaid Other | Attending: Emergency Medicine | Admitting: Emergency Medicine

## 2011-02-16 ENCOUNTER — Telehealth: Payer: Self-pay | Admitting: *Deleted

## 2011-02-16 DIAGNOSIS — F209 Schizophrenia, unspecified: Secondary | ICD-10-CM | POA: Insufficient documentation

## 2011-02-16 DIAGNOSIS — Z794 Long term (current) use of insulin: Secondary | ICD-10-CM | POA: Insufficient documentation

## 2011-02-16 DIAGNOSIS — E039 Hypothyroidism, unspecified: Secondary | ICD-10-CM | POA: Insufficient documentation

## 2011-02-16 DIAGNOSIS — E785 Hyperlipidemia, unspecified: Secondary | ICD-10-CM | POA: Insufficient documentation

## 2011-02-16 DIAGNOSIS — E109 Type 1 diabetes mellitus without complications: Secondary | ICD-10-CM

## 2011-02-16 DIAGNOSIS — F319 Bipolar disorder, unspecified: Secondary | ICD-10-CM | POA: Insufficient documentation

## 2011-02-16 DIAGNOSIS — E1169 Type 2 diabetes mellitus with other specified complication: Secondary | ICD-10-CM | POA: Insufficient documentation

## 2011-02-16 LAB — GLUCOSE, CAPILLARY
Glucose-Capillary: 83 mg/dL (ref 70–99)
Glucose-Capillary: 117 mg/dL — ABNORMAL HIGH (ref 70–99)

## 2011-02-16 NOTE — Telephone Encounter (Signed)
Pt arrived from [facility] to facility at appr 1000, cbg was done at 1130 w/ results of 404, pt was given 10 units of humalog according to facility's sliding scale. Pt is not having any adverse responses. Has not eaten since coming back to facility this am. Please advise.

## 2011-02-16 NOTE — Telephone Encounter (Signed)
Called [facility] and spoke with [name], patient was found unresponsive in her sleep, Clammy between 630 and 7 am this am. [name] thinks her sugars had dropped  to 20. Note her sugar was 83 in ER.  CDE was told that [name] blood sugar at dinner last night was 280- and she got 7 units Novolog and ate. Then at bedtime her sugar was 320 last night  at 8 pm- and she got 16 units lantus and "8 units", then [name] retracted that she was only given the 16 Lantus. I asked again if she got the 8 units of Novolog and again [name] said she "had misread" and that [name] did not get any Novolog at bedtime.  [name] also stated that she would make a note that patient should not get any Novolog at bedtime.  Regarding the blood sugar of 404 before lunch today and patient not eating: patient did finally go ahead and eat.   Patient weight 148# which is increased 2.5# since 01/03/11        [name] asked that I speak with [name] about eating: I spoke to [name] who stated that she didn't like a lot of their foods and would like to have a substitute when she doesn't eat and a small bedtime snack.   May help to have staff  1- give 4 saltine crackers & peanut butter at bedtime  2-  give 4 oz juice or 8 oz milk when patient eats < 50% of her meal. 3- give two 8 unit doses of lantus every 12 hours.  Will discuss with [name] and will need an order to fax to [phone] as appropriate.Marland Kitchen

## 2011-02-16 NOTE — Telephone Encounter (Signed)
CBG was 12 this AM at facility and was unresponsive and transported to ER. Pt refused ID. D/C'd with CBG > 160 and was eating. With so many hypoglycemic episodes, I would tolerate hyperglycemia. However, Scherrie Gerlach discovered that facility was timing insulin in correctly so will forward to her for additional rec.

## 2011-02-17 MED ORDER — INSULIN GLARGINE 100 UNIT/ML ~~LOC~~ SOLN: 8.0000 [IU] | Freq: Two times a day (BID) | SUBCUTANEOUS | Status: DC

## 2011-02-17 NOTE — Telephone Encounter (Signed)
Nicole Leash t., please review the note and advise if you have concerns on pt's care.

## 2011-02-17 NOTE — Telephone Encounter (Signed)
Faxed orders to [facility] at 11:08 Am this morning. Called to confirm receipt and understanding of      faxed orders. Spoke with [facility] who verbalized understanding of new orders. She will fax most recent blood sugars for Lala.

## 2011-02-17 NOTE — Telephone Encounter (Signed)
Would you pls go ahead and give verbal order for the suggestions you had (Insulin change and food suggestions). I will make the change to insulin on her MAR> Thanks

## 2011-02-20 ENCOUNTER — Telehealth: Payer: Self-pay | Admitting: *Deleted

## 2011-02-20 NOTE — Telephone Encounter (Signed)
Agree with plan 

## 2011-02-20 NOTE — Telephone Encounter (Signed)
Call from [place]  at [facility] reporting that pts CBG is 530. They are giving her 12 units of regular insulin as ordered in [facility] and they are calling to let us know.  I called [place] and cbg was checked before lunch.  This AM cbg was 124.  Pt does eat in room and does NOT follow her diabetic diet. They will recheck CGB in one hour.  Pt just had lunch.  Will call us if still elevated. Please advise  # [phone]

## 2011-02-26 ENCOUNTER — Telehealth: Payer: Self-pay | Admitting: Internal Medicine

## 2011-02-26 NOTE — Telephone Encounter (Signed)
Called from home health nurse about high blood sugars. Blood sugars have been running in 200s to 300s throughout the day. Increased lantus to 10 u bid.  Check blood sugars qid.

## 2011-02-28 ENCOUNTER — Telehealth: Payer: Self-pay | Admitting: *Deleted

## 2011-02-28 ENCOUNTER — Telehealth: Payer: Self-pay | Admitting: Dietician

## 2011-02-28 ENCOUNTER — Encounter: Payer: Self-pay | Admitting: Internal Medicine

## 2011-02-28 DIAGNOSIS — E109 Type 1 diabetes mellitus without complications: Secondary | ICD-10-CM

## 2011-02-28 MED ORDER — INSULIN GLARGINE 100 UNIT/ML ~~LOC~~ SOLN: 10.0000 [IU] | Freq: Two times a day (BID) | SUBCUTANEOUS | Status: DC

## 2011-02-28 NOTE — Telephone Encounter (Signed)
If [name] is asymptomatic please re-check the blood sugar when scheduled.  If > 80 no further evaluation or changes in therapy or necessary.  The facility should call the clinic back if she continues to have low blood sugars (< 80) in the mornings. If on repeat testing this AM she remains less than 80 please call back so that we can adjust her lantus.  Of note, her lantus was increased from 16 units QHS to 10 units BID 2 days ago.  We may need to adjust downwards if sugars remain low.

## 2011-02-28 NOTE — Telephone Encounter (Signed)
Called [name] back again after reviewing chart: they had increased patient's lantus to 10 units twice daily starting sunday PM. I also clarified the am blood sugar today- [name] meant that [name]';s blood sugar was ~ 400 when she left for the day after she had eaten her breakfast and mor food after that.   I suggest decreasing lantus to 17 units daily and using correction to address excursions due to changes  in food intake and activity: to 8 units @ 7 PM and 9 units @ 7 AM

## 2011-02-28 NOTE — Telephone Encounter (Signed)
Agree. The med change to 10 BID had never been made in EPIC so have changed it. Will forward to [name] R as she has been working with pt.

## 2011-02-28 NOTE — Telephone Encounter (Signed)
Spoke w/ the med tech, gave her the orders, she states pt's blood sugar at 1130 was 325. i went through the correct way to call and report incidents and gave her the ph#'s to call, i explained who the residents are and who the attendings are, she wrote the instructions down and will post them on pt's chart

## 2011-02-28 NOTE — Telephone Encounter (Signed)
Latoya, med tech at [facility] calls at 1046 to say that pt's blood sugar at 0800 was 48, pt had breakfast at appr 0815, had juice, milk, sausage, wafflesw/ syrup and cereal. Pt refused to have bs rechecked, latoya states she called just before 0830 and clinic was closed she then called main # for hosp and was told that dr Midwife would call her back (my understanding is dr Midwife is on vacation). She then had other things to do and is just now calling the clinic back. Pt is asymptomatic and she wants to know what to do, i ask when pt would have bs checked next, she stated 1130. Please advise.

## 2011-02-28 NOTE — Progress Notes (Signed)
Addended by: Remus Blake on: 02/28/2011 01:41 PM   Modules accepted: Orders

## 2011-02-28 NOTE — Telephone Encounter (Signed)
[name] Medtech at [facility], called to inform us that [name] signed herself out this morning saying she was going to her daughter's house overnight. Her blood sugar at that time was >400 and they had called this to [place]. She also signed out her medicine for overnight. When [facility] staff cleaned her room later today, they found the medicine she had signed out and was supposed to take with her and syringes with some clear liquid in them that [name]'s roommate said she was taking at night. Syringes have been destroyed.   Attending physician made aware. Will route to PCP.

## 2011-03-02 ENCOUNTER — Other Ambulatory Visit: Payer: Self-pay | Admitting: Internal Medicine

## 2011-03-02 DIAGNOSIS — E109 Type 1 diabetes mellitus without complications: Secondary | ICD-10-CM

## 2011-03-03 ENCOUNTER — Telehealth: Payer: Self-pay | Admitting: *Deleted

## 2011-03-03 NOTE — Telephone Encounter (Signed)
Received a call from [facility] calling to let us know that today pt came in saying she feels funny and had taken insulin.  They checked her CBG and received a reading of low, they rechecked and got a reading of 33.  Pt was alert, walking around and refused to be stuck again.  She has now left the assisted living and I can't get in touch with the pt.

## 2011-03-03 NOTE — Telephone Encounter (Signed)
Noted, ? Accuracy of CBG.

## 2011-03-06 ENCOUNTER — Telehealth: Payer: Self-pay | Admitting: *Deleted

## 2011-03-06 NOTE — Telephone Encounter (Signed)
Will forward to [name] as she has been addressing CBG fluctuations.

## 2011-03-06 NOTE — Telephone Encounter (Signed)
Was patient also given her AM Lantus? How much? Please recheck CBG at 12:30. Thank you.

## 2011-03-06 NOTE — Telephone Encounter (Signed)
Med aide at [facility] calls to state that 0730 blood sugar was 54, got 3 units humalog At 1130 blood sugar  Was 526, 12 units humalog Please advise

## 2011-03-06 NOTE — Telephone Encounter (Signed)
Pt refused her lantus this am cbg at 1225- 350 Pleased advise

## 2011-03-07 NOTE — Telephone Encounter (Signed)
I noted pt was contacted and talked with [name] R about fluctuations in cbg.  This AM CBG reading was 119.

## 2011-03-08 ENCOUNTER — Telehealth: Payer: Self-pay | Admitting: Dietician

## 2011-03-08 NOTE — Telephone Encounter (Signed)
Called and confirmed current order for lantus: 10 units twice daily. Per Dr. Phillips Odor, will decrease to 9 units at 7 AM and 8 units at 7 PM, goal is to keep fasting blood sugar > 90mg /dl. Requested past 3 weeks blood sugar log, schedule appointment with PCP. Order faxed to [facility] and confirmed receipt and understanding with [facility], med-tech.   [name] reports that Caiden has been more agitated and refusing to take medicines, except does takes insulin. [facility] has contacted her mental health doctor.  Nesiah's blood sugar was 160 today before lunch and she got 5 units and went to eat lunch. Appointment scheduled for 04/04/11 with PCP, [name].

## 2011-03-14 ENCOUNTER — Telehealth: Payer: Self-pay | Admitting: *Deleted

## 2011-03-14 NOTE — Telephone Encounter (Signed)
[name] calls to ask if they must call each time pt's blood sugar is less than 80, this am it was 58 at 0730, pt had breakfast and at 1130 blood sugar was 336 and she received 8 units insulin. She has an upcoming appt and i stressed the need to keep this so possibly pt can be evaluated and prescribed treatment be altered but for now please advise

## 2011-03-14 NOTE — Telephone Encounter (Signed)
Yes I would like them to call each time it is low. Our goal is to allow permissive hyperglycemia to prevent hypoglycemia so we need to know how freq it is low so we can slowly back off on insulin. We cc Scherrie Gerlach

## 2011-03-15 ENCOUNTER — Other Ambulatory Visit: Payer: Self-pay | Admitting: *Deleted

## 2011-03-15 ENCOUNTER — Encounter: Payer: Self-pay | Admitting: Internal Medicine

## 2011-03-15 DIAGNOSIS — E109 Type 1 diabetes mellitus without complications: Secondary | ICD-10-CM

## 2011-03-15 MED ORDER — INSULIN GLARGINE 100 UNIT/ML ~~LOC~~ SOLN: SUBCUTANEOUS | Status: DC

## 2011-03-15 MED ORDER — INSULIN LISPRO 100 UNIT/ML ~~LOC~~ SOLN: SUBCUTANEOUS | Status: DC

## 2011-03-15 MED ORDER — LANCETS MISC: Status: DC

## 2011-03-15 MED ORDER — "INSULIN SYRINGE 31G X 5/16"" 1 ML MISC": Status: DC

## 2011-03-15 NOTE — Telephone Encounter (Signed)
Pt stated she is no longer at [facility].  I called [facility] and she said pt checked herself out and had new bottle of insulin, from last week, with her.

## 2011-03-15 NOTE — Telephone Encounter (Signed)
It is helpful to know when her blood sugar is less than 80. I appreciate them calling and I agree we should continue this. Thank you for keeping me updated.

## 2011-03-23 ENCOUNTER — Telehealth: Payer: Self-pay | Admitting: *Deleted

## 2011-03-23 NOTE — Telephone Encounter (Signed)
[name] - Does she need mental health consult or court to determine competence or decision making capacity? If not, the 18th is my earliest appt I think.

## 2011-03-23 NOTE — Telephone Encounter (Signed)
Pt stopped by clinic and states she has been released from [facility] ( assisted living)  She needs to get her Social Security check returned to her instead of [facility].  You need to meet with her and see if she is able to handle her finances.  She now lives with her mother. She has appointment with you 9/18 but she wants this done sooner because she needs the money and check is ready now. I talked with [name] T and she states she will meet with you at her appointment,  if you want. Pt # [id number]

## 2011-03-24 ENCOUNTER — Telehealth: Payer: Self-pay | Admitting: Dietician

## 2011-03-24 NOTE — Telephone Encounter (Signed)
Left a message on [name]'s answering machine to please have [name] give [place] a call

## 2011-03-24 NOTE — Telephone Encounter (Signed)
Pt called, no answer.  Message left for her to call clinic

## 2011-03-27 NOTE — Telephone Encounter (Signed)
Another call to pt, no answer, message left to call clinic

## 2011-03-30 NOTE — Telephone Encounter (Signed)
Called pt again.  No answer but she does have an appointment with Dr Rogelia Boga

## 2011-03-31 ENCOUNTER — Telehealth: Payer: Self-pay | Admitting: Licensed Clinical Social Worker

## 2011-03-31 NOTE — Telephone Encounter (Signed)
Called Director of [facility] who advised me that patient left [facility] on 8/28 because she wanted to live independent.  Her September check was sent to the Soc. Security office and he said future checks will be sent there and she can pick up.

## 2011-04-04 ENCOUNTER — Encounter: Payer: Self-pay | Admitting: Internal Medicine

## 2011-04-04 ENCOUNTER — Ambulatory Visit (INDEPENDENT_AMBULATORY_CARE_PROVIDER_SITE_OTHER): Payer: Medicaid Other | Admitting: Internal Medicine

## 2011-04-04 DIAGNOSIS — F489 Nonpsychotic mental disorder, unspecified: Secondary | ICD-10-CM

## 2011-04-04 DIAGNOSIS — E785 Hyperlipidemia, unspecified: Secondary | ICD-10-CM

## 2011-04-04 DIAGNOSIS — E109 Type 1 diabetes mellitus without complications: Secondary | ICD-10-CM

## 2011-04-04 DIAGNOSIS — E119 Type 2 diabetes mellitus without complications: Secondary | ICD-10-CM

## 2011-04-04 DIAGNOSIS — F99 Mental disorder, not otherwise specified: Secondary | ICD-10-CM

## 2011-04-04 DIAGNOSIS — F1910 Other psychoactive substance abuse, uncomplicated: Secondary | ICD-10-CM

## 2011-04-04 DIAGNOSIS — D649 Anemia, unspecified: Secondary | ICD-10-CM

## 2011-04-04 DIAGNOSIS — Z0000 Encounter for general adult medical examination without abnormal findings: Secondary | ICD-10-CM

## 2011-04-04 LAB — CBC
Hemoglobin: 13.9 g/dL (ref 12.0–15.0)
MCH: 20.6 pg — ABNORMAL LOW (ref 26.0–34.0)
MCHC: 32 g/dL (ref 30.0–36.0)
MCV: 64.2 fL — ABNORMAL LOW (ref 78.0–100.0)
RDW: 15.7 % — ABNORMAL HIGH (ref 11.5–15.5)

## 2011-04-04 LAB — GLUCOSE, CAPILLARY: Glucose-Capillary: 348 mg/dL — ABNORMAL HIGH (ref 70–99)

## 2011-04-04 MED ORDER — INSULIN LISPRO 100 UNIT/ML ~~LOC~~ SOLN: SUBCUTANEOUS | Status: DC

## 2011-04-04 MED ORDER — INSULIN GLARGINE 100 UNIT/ML ~~LOC~~ SOLN: SUBCUTANEOUS | Status: DC

## 2011-04-04 NOTE — Assessment & Plan Note (Signed)
Pt claims she had a PAP one year ago at [facility]. I have not been able to find this in the system.

## 2011-04-04 NOTE — Assessment & Plan Note (Signed)
Pt denies tobacco, ETOH, and cocaine in past 8 months.

## 2011-04-04 NOTE — Assessment & Plan Note (Signed)
Pt unable to give me the mental health diagnosis and she no longer sees mental health.

## 2011-04-04 NOTE — Assessment & Plan Note (Signed)
A1C has decreased today but not at goal. She is challening and I doubt that we will ever achieve glycemic control. She is no longer using the dosing regimen that Tobey Bride developed. The best I can hope for is to prevent hypoglycemia, keep her out of the hospital, and be a safety net.

## 2011-04-04 NOTE — Assessment & Plan Note (Signed)
It has been a while since she had a HgB so will check. Ferritin was very low, not on FeSO4 so will check.

## 2011-04-04 NOTE — Assessment & Plan Note (Signed)
Even though she is not fasting, will check a lipid panel today. Has been well controlled in past.

## 2011-04-04 NOTE — Progress Notes (Signed)
  Subjective:    Patient ID: [name], female    DOB: 22-Jul-1965, 45 y.o.   MRN: [id number]  HPI  [name] is a 45 yo female with un known mental d/o who had been living at [facility] until recently when she signed herself out. When I asked her why, she stated it was time. She is now living with her mother and states that is going well and they get along well. She has a brother that also has DM and they also get along.   [name] requests a letter that she can submit to get her checks mailed directly to her. She has managed her fianaces independetly previously. She is literate. I asked about mental health issues and she states that she used to see mental health but was unable to give me a dx and she no longer goes to mental health.   [name] denies smoking, using ETOH, or cocaine this year.    Review of Systems [name] had no complaints today.    Objective:   Physical Exam  Constitutional: She is oriented to person, place, and time. She appears well-developed and well-nourished. No distress.  HENT:  Head: Normocephalic and atraumatic.  Right Ear: External ear normal.  Left Ear: External ear normal.  Nose: Nose normal.  Eyes: Conjunctivae and EOM are normal.  Musculoskeletal: Normal range of motion.  Neurological: She is alert and oriented to person, place, and time. Coordination normal.  Skin: Skin is warm and dry. She is not diaphoretic.  Psychiatric: She has a normal mood and affect. Her behavior is normal. Judgment and thought content normal.       Seemed to have some psychomotor agitation.          Assessment & Plan:

## 2011-04-05 ENCOUNTER — Encounter: Payer: Self-pay | Admitting: Internal Medicine

## 2011-04-05 LAB — LIPID PANEL
Cholesterol: 208 mg/dL — ABNORMAL HIGH (ref 0–200)
LDL Cholesterol: 126 mg/dL — ABNORMAL HIGH (ref 0–99)
Total CHOL/HDL Ratio: 3.4 Ratio
Triglycerides: 106 mg/dL (ref <150)
VLDL: 21 mg/dL (ref 0–40)

## 2011-04-05 LAB — FERRITIN: Ferritin: 30 ng/mL (ref 10–291)

## 2011-04-18 LAB — COMPREHENSIVE METABOLIC PANEL
ALT: 14 U/L (ref 0–35)
AST: 27 U/L (ref 0–37)
Albumin: 3.6 g/dL (ref 3.5–5.2)
Alkaline Phosphatase: 53 U/L (ref 39–117)
Calcium: 8.8 mg/dL (ref 8.4–10.5)
GFR calc Af Amer: >60 mL/min (ref >60)
Potassium: 4.1 mEq/L (ref 3.5–5.1)
Sodium: 133 mEq/L — ABNORMAL LOW (ref 135–145)
Total Protein: 7.3 g/dL (ref 6.0–8.3)

## 2011-04-18 LAB — GLUCOSE, CAPILLARY
Glucose-Capillary: 115 mg/dL — ABNORMAL HIGH (ref 70–99)
Glucose-Capillary: 117 mg/dL — ABNORMAL HIGH (ref 70–99)
Glucose-Capillary: 139 mg/dL — ABNORMAL HIGH (ref 70–99)
Glucose-Capillary: 166 mg/dL — ABNORMAL HIGH (ref 70–99)
Glucose-Capillary: 170 mg/dL — ABNORMAL HIGH (ref 70–99)
Glucose-Capillary: 180 mg/dL — ABNORMAL HIGH (ref 70–99)
Glucose-Capillary: 203 mg/dL — ABNORMAL HIGH (ref 70–99)
Glucose-Capillary: 242 mg/dL — ABNORMAL HIGH (ref 70–99)
Glucose-Capillary: 255 mg/dL — ABNORMAL HIGH (ref 70–99)
Glucose-Capillary: 257 mg/dL — ABNORMAL HIGH (ref 70–99)
Glucose-Capillary: 261 mg/dL — ABNORMAL HIGH (ref 70–99)
Glucose-Capillary: 263 mg/dL — ABNORMAL HIGH (ref 70–99)
Glucose-Capillary: 282 mg/dL — ABNORMAL HIGH (ref 70–99)
Glucose-Capillary: 283 mg/dL — ABNORMAL HIGH (ref 70–99)
Glucose-Capillary: 30 mg/dL — CL (ref 70–99)
Glucose-Capillary: 344 mg/dL — ABNORMAL HIGH (ref 70–99)
Glucose-Capillary: 357 mg/dL — ABNORMAL HIGH (ref 70–99)
Glucose-Capillary: 381 mg/dL — ABNORMAL HIGH (ref 70–99)
Glucose-Capillary: 449 mg/dL — ABNORMAL HIGH (ref 70–99)
Glucose-Capillary: 502 mg/dL (ref 70–99)
Glucose-Capillary: 90 mg/dL (ref 70–99)
Glucose-Capillary: 102 mg/dL — ABNORMAL HIGH (ref 70–99)
Glucose-Capillary: 121 mg/dL — ABNORMAL HIGH (ref 70–99)
Glucose-Capillary: 140 mg/dL — ABNORMAL HIGH (ref 70–99)
Glucose-Capillary: 166 mg/dL — ABNORMAL HIGH (ref 70–99)
Glucose-Capillary: 168 mg/dL — ABNORMAL HIGH (ref 70–99)
Glucose-Capillary: 200 mg/dL — ABNORMAL HIGH (ref 70–99)
Glucose-Capillary: 218 mg/dL — ABNORMAL HIGH (ref 70–99)
Glucose-Capillary: 27 mg/dL — CL (ref 70–99)
Glucose-Capillary: 299 mg/dL — ABNORMAL HIGH (ref 70–99)
Glucose-Capillary: 324 mg/dL — ABNORMAL HIGH (ref 70–99)
Glucose-Capillary: 355 mg/dL — ABNORMAL HIGH (ref 70–99)
Glucose-Capillary: 394 mg/dL — ABNORMAL HIGH (ref 70–99)
Glucose-Capillary: 404 mg/dL — ABNORMAL HIGH (ref 70–99)
Glucose-Capillary: 499 mg/dL — ABNORMAL HIGH (ref 70–99)
Glucose-Capillary: 521 mg/dL (ref 70–99)
Glucose-Capillary: 557 mg/dL (ref 70–99)
Glucose-Capillary: 85 mg/dL (ref 70–99)

## 2011-04-18 LAB — BASIC METABOLIC PANEL
CO2: 23 mEq/L (ref 19–32)
CO2: 24 mEq/L (ref 19–32)
Calcium: 8.8 mg/dL (ref 8.4–10.5)
Creatinine, Ser: 0.92 mg/dL (ref 0.4–1.2)
GFR calc Af Amer: >60 mL/min (ref >60)
Glucose, Bld: 115 mg/dL — ABNORMAL HIGH (ref 70–99)
Potassium: 3.4 mEq/L — ABNORMAL LOW (ref 3.5–5.1)
Sodium: 141 mEq/L (ref 135–145)

## 2011-04-18 LAB — POCT I-STAT, CHEM 8
BUN: 18 mg/dL (ref 6–23)
Calcium, Ion: 1.16 mmol/L (ref 1.12–1.32)
Chloride: 109 mEq/L (ref 96–112)
HCT: 40 % (ref 36.0–46.0)
Sodium: 144 mEq/L (ref 135–145)
TCO2: 24 mmol/L (ref 0–100)

## 2011-04-18 LAB — RAPID URINE DRUG SCREEN, HOSP PERFORMED
Amphetamines: NOT DETECTED
Cocaine: POSITIVE — AB
Opiates: NOT DETECTED
Tetrahydrocannabinol: NOT DETECTED

## 2011-04-18 LAB — TSH: TSH: 2.008 u[IU]/mL (ref 0.350–4.500)

## 2011-04-18 LAB — URINALYSIS, ROUTINE W REFLEX MICROSCOPIC
Glucose, UA: 250 mg/dL — AB
Ketones, ur: 40 mg/dL — AB
Nitrite: NEGATIVE
Protein, ur: 30 mg/dL — AB
pH: 6 (ref 5.0–8.0)

## 2011-04-18 LAB — CBC
Hemoglobin: 11.6 g/dL — ABNORMAL LOW (ref 12.0–15.0)
MCHC: 31.1 g/dL (ref 30.0–36.0)
MCHC: 31.3 g/dL (ref 30.0–36.0)
MCV: 67.6 fL — ABNORMAL LOW (ref 78.0–100.0)
Platelets: 291 10*3/uL (ref 150–400)
Platelets: 319 10*3/uL (ref 150–400)
RBC: 5.67 MIL/uL — ABNORMAL HIGH (ref 3.87–5.11)
RDW: 15.4 % (ref 11.5–15.5)

## 2011-04-18 LAB — LIPID PANEL
Cholesterol: 143 mg/dL (ref 0–200)
HDL: 60 mg/dL (ref >39)
LDL Cholesterol: 76 mg/dL (ref 0–99)
Triglycerides: 35 mg/dL (ref <150)

## 2011-04-18 LAB — DIFFERENTIAL
Basophils Absolute: 0 10*3/uL (ref 0.0–0.1)
Basophils Relative: 0 % (ref 0–1)
Eosinophils Absolute: 0.1 10*3/uL (ref 0.0–0.7)
Eosinophils Absolute: 0.1 10*3/uL (ref 0.0–0.7)
Lymphocytes Relative: 14 % (ref 12–46)
Monocytes Relative: 6 % (ref 3–12)
Monocytes Relative: 6 % (ref 3–12)
Neutrophils Relative %: 65 % (ref 43–77)
Neutrophils Relative %: 79 % — ABNORMAL HIGH (ref 43–77)

## 2011-04-18 LAB — URINE CULTURE

## 2011-04-18 LAB — MAGNESIUM: Magnesium: 2.1 mg/dL (ref 1.5–2.5)

## 2011-04-18 LAB — URINE MICROSCOPIC-ADD ON

## 2011-04-19 LAB — BLOOD GAS, ARTERIAL
Bicarbonate: 23.4
Drawn by: 280981
O2 Saturation: 98.4
Patient temperature: 98.6
pH, Arterial: 7.458 — ABNORMAL HIGH
pO2, Arterial: 104 — ABNORMAL HIGH

## 2011-04-19 LAB — COMPREHENSIVE METABOLIC PANEL
AST: 15
CO2: 23
Calcium: 9
Chloride: 99
Creatinine, Ser: 0.85
GFR calc non Af Amer: >60
Glucose, Bld: 275 — ABNORMAL HIGH
Total Bilirubin: 0.3

## 2011-04-19 LAB — DIFFERENTIAL
Basophils Relative: 1
Eosinophils Absolute: 0.1
Eosinophils Relative: 2
Lymphs Abs: 1.8
Monocytes Absolute: 0.6
Neutro Abs: 4

## 2011-04-19 LAB — CBC
HCT: 36.5
Hemoglobin: 11.6 — ABNORMAL LOW
MCHC: 31.7
MCV: 65.8 — ABNORMAL LOW
RBC: 5.54 — ABNORMAL HIGH
WBC: 6.6

## 2011-04-19 LAB — GLUCOSE, CAPILLARY
Glucose-Capillary: 364 — ABNORMAL HIGH
Glucose-Capillary: 134 — ABNORMAL HIGH
Glucose-Capillary: 173 — ABNORMAL HIGH
Glucose-Capillary: 191 — ABNORMAL HIGH
Glucose-Capillary: 194 — ABNORMAL HIGH
Glucose-Capillary: 197 — ABNORMAL HIGH
Glucose-Capillary: 222 — ABNORMAL HIGH
Glucose-Capillary: 289 — ABNORMAL HIGH
Glucose-Capillary: 34 — CL
Glucose-Capillary: 455 — ABNORMAL HIGH
Glucose-Capillary: >600

## 2011-04-20 LAB — GLUCOSE, CAPILLARY: Glucose-Capillary: 228 mg/dL — ABNORMAL HIGH (ref 70–99)

## 2011-04-21 LAB — GLUCOSE, CAPILLARY
Glucose-Capillary: 203 mg/dL — ABNORMAL HIGH (ref 70–99)
Glucose-Capillary: 63 mg/dL — ABNORMAL LOW (ref 70–99)

## 2011-05-01 LAB — BASIC METABOLIC PANEL
CO2: 25
Calcium: 8.7
GFR calc Af Amer: >60
GFR calc non Af Amer: >60
GFR calc non Af Amer: >60
Glucose, Bld: 149 — ABNORMAL HIGH
Glucose, Bld: 380 — ABNORMAL HIGH
Potassium: 4
Potassium: 4.2
Sodium: 134 — ABNORMAL LOW
Sodium: 140

## 2011-05-01 LAB — CBC
MCHC: 30.9
MCHC: 31.8
MCV: 64.8 — ABNORMAL LOW
MCV: 66.4 — ABNORMAL LOW
Platelets: 334
RDW: 15.9 — ABNORMAL HIGH

## 2011-05-01 LAB — TSH: TSH: 7.862 — ABNORMAL HIGH

## 2011-05-01 LAB — LIPID PANEL
Cholesterol: 140
Total CHOL/HDL Ratio: 3

## 2011-05-01 LAB — DIFFERENTIAL
Basophils Absolute: 0
Eosinophils Absolute: 0.1
Lymphocytes Relative: 42
Monocytes Absolute: 0.5
Neutrophils Relative %: 44

## 2011-05-01 LAB — RAPID URINE DRUG SCREEN, HOSP PERFORMED
Amphetamines: NOT DETECTED
Barbiturates: NOT DETECTED

## 2011-05-23 ENCOUNTER — Other Ambulatory Visit: Payer: Self-pay | Admitting: Internal Medicine

## 2011-06-01 ENCOUNTER — Encounter: Payer: Medicaid Other | Admitting: Internal Medicine

## 2011-06-06 ENCOUNTER — Encounter: Payer: Self-pay | Admitting: *Deleted

## 2011-06-07 ENCOUNTER — Encounter: Payer: Medicaid Other | Admitting: Internal Medicine

## 2011-06-09 ENCOUNTER — Encounter: Payer: Self-pay | Admitting: Internal Medicine

## 2011-06-11 ENCOUNTER — Encounter: Payer: Self-pay | Admitting: Cardiology

## 2011-06-20 ENCOUNTER — Encounter: Payer: Self-pay | Admitting: Internal Medicine

## 2011-07-25 ENCOUNTER — Encounter: Payer: Self-pay | Admitting: Internal Medicine

## 2011-07-25 ENCOUNTER — Ambulatory Visit (INDEPENDENT_AMBULATORY_CARE_PROVIDER_SITE_OTHER): Payer: Medicaid Other | Admitting: Internal Medicine

## 2011-07-25 VITALS — BP 116/78 | HR 82 | Temp 97.3°F | Resp 20 | Ht 61.0 in | Wt 137.3 lb

## 2011-07-25 DIAGNOSIS — E109 Type 1 diabetes mellitus without complications: Secondary | ICD-10-CM

## 2011-07-25 DIAGNOSIS — E785 Hyperlipidemia, unspecified: Secondary | ICD-10-CM

## 2011-07-25 DIAGNOSIS — Z0000 Encounter for general adult medical examination without abnormal findings: Secondary | ICD-10-CM

## 2011-07-25 LAB — GLUCOSE, CAPILLARY: Glucose-Capillary: 379 mg/dL — ABNORMAL HIGH (ref 70–99)

## 2011-07-25 MED ORDER — "INSULIN SYRINGE 31G X 5/16"" 1 ML MISC": Status: DC

## 2011-07-25 MED ORDER — INSULIN GLARGINE 100 UNIT/ML ~~LOC~~ SOLN: SUBCUTANEOUS | Status: DC

## 2011-07-25 MED ORDER — INSULIN LISPRO 100 UNIT/ML ~~LOC~~ SOLN: SUBCUTANEOUS | Status: DC

## 2011-07-25 MED ORDER — LANCETS MISC: Status: DC

## 2011-07-25 NOTE — Progress Notes (Signed)
  Subjective:    Patient ID: [name], female    DOB: [birth year], 46 y.o.   MRN: [id number]  HPI  Please see the A&P for the status of the pt's chronic medical problems.   Review of Systems  Constitutional: Negative for fever and activity change.  HENT: Negative for ear pain, congestion, rhinorrhea and neck pain.   Eyes: Negative for pain, itching and visual disturbance.  Respiratory: Positive for shortness of breath. Negative for cough.   Cardiovascular: Negative for chest pain.  Gastrointestinal: Positive for diarrhea and constipation.  Genitourinary: Positive for frequency. Negative for dysuria.  Musculoskeletal: Negative for back pain and gait problem.  Skin: Negative for color change and rash.  Neurological: Negative for dizziness and headaches.  Psychiatric/Behavioral: Negative for confusion, sleep disturbance and agitation.       Objective:   Physical Exam  Constitutional: She is oriented to person, place, and time. She appears well-developed and well-nourished. No distress.  HENT:  Head: Normocephalic and atraumatic.  Right Ear: External ear normal.  Left Ear: External ear normal.  Nose: Nose normal.  Eyes: Conjunctivae are normal.  Cardiovascular: Normal rate, regular rhythm and normal heart sounds.   Pulmonary/Chest: Effort normal and breath sounds normal.  Musculoskeletal: She exhibits no edema and no tenderness.  Neurological: She is alert and oriented to person, place, and time.  Skin: Skin is warm and dry. She is not diaphoretic.  Psychiatric: She has a normal mood and affect. Her behavior is normal. Judgment and thought content normal.          Assessment & Plan:

## 2011-07-25 NOTE — Patient Instructions (Signed)
Please pick up your medications at [facility]. Our telephone number is [phone] Please see me in 3 months.

## 2011-07-26 ENCOUNTER — Encounter: Payer: Self-pay | Admitting: *Deleted

## 2011-08-02 ENCOUNTER — Encounter: Payer: Self-pay | Admitting: Internal Medicine

## 2011-08-02 NOTE — Assessment & Plan Note (Signed)
She is able to tell me her insulin dosing. I doubt though that she has great compliance. She mentioned something about not having the copay but when I tried to get more info she stated she did have money for copay. Lab Results  Component Value Date   HGBA1C 8.6 04/04/2011   It appears that a new A1C was ordered but never done this visit. I refilled her insulin Rx.

## 2011-08-02 NOTE — Assessment & Plan Note (Signed)
She wanted to return to OB/Gyn for her routine pap smears so I referred her.  She stated that she had gotten flu vaccine at the pharmacy but when [facility] called, she did not. She was gone by then so I was not able to vaccinate her.

## 2011-08-02 NOTE — Assessment & Plan Note (Signed)
Her LDL was not great when last checked.

## 2011-09-04 ENCOUNTER — Encounter: Payer: Medicaid Other | Admitting: Physician Assistant

## 2011-09-06 ENCOUNTER — Encounter: Payer: Self-pay | Admitting: Internal Medicine

## 2011-09-06 NOTE — Progress Notes (Signed)
Addended by: Neomia Dear on: 09/06/2011 01:04 PM   Modules accepted: Orders

## 2011-09-20 ENCOUNTER — Encounter (HOSPITAL_COMMUNITY): Payer: Self-pay | Admitting: Emergency Medicine

## 2011-09-20 ENCOUNTER — Emergency Department (HOSPITAL_COMMUNITY)
Admission: EM | Admit: 2011-09-20 | Discharge: 2011-09-20 | Disposition: A | Discharge status: Home / Self Care | Payer: Medicaid Other | Attending: Emergency Medicine | Admitting: Emergency Medicine

## 2011-09-20 DIAGNOSIS — Z7982 Long term (current) use of aspirin: Secondary | ICD-10-CM | POA: Insufficient documentation

## 2011-09-20 DIAGNOSIS — R21 Rash and other nonspecific skin eruption: Secondary | ICD-10-CM | POA: Insufficient documentation

## 2011-09-20 DIAGNOSIS — E785 Hyperlipidemia, unspecified: Secondary | ICD-10-CM | POA: Insufficient documentation

## 2011-09-20 DIAGNOSIS — E109 Type 1 diabetes mellitus without complications: Secondary | ICD-10-CM | POA: Insufficient documentation

## 2011-09-20 DIAGNOSIS — F489 Nonpsychotic mental disorder, unspecified: Secondary | ICD-10-CM | POA: Insufficient documentation

## 2011-09-20 DIAGNOSIS — Z794 Long term (current) use of insulin: Secondary | ICD-10-CM | POA: Insufficient documentation

## 2011-09-20 DIAGNOSIS — Z87891 Personal history of nicotine dependence: Secondary | ICD-10-CM | POA: Insufficient documentation

## 2011-09-20 MED ORDER — PERMETHRIN 5 % EX CREA: TOPICAL_CREAM | CUTANEOUS | Status: AC

## 2011-09-20 NOTE — ED Notes (Signed)
EDP (Dr. Bebe Shaggy) at bedside.

## 2011-09-20 NOTE — ED Notes (Signed)
Patient given discharge paperwork; went over discharge instructions with patient.  Instructed patient to apply permethrin cream as directed, to wash all sheets, clothing, and linens in hot water, and to return to the ED for new, worsening, or concerning symptoms.
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
## 2011-09-20 NOTE — ED Notes (Signed)
Patient complaining of bed bug bites; states that she has had problem with bed bugs for a month.  Patient complains of itchiness all over.

## 2011-09-21 ENCOUNTER — Encounter: Payer: Self-pay | Admitting: Internal Medicine

## 2011-10-24 ENCOUNTER — Ambulatory Visit: Payer: Medicaid Other | Admitting: Internal Medicine

## 2011-11-14 ENCOUNTER — Emergency Department (HOSPITAL_COMMUNITY): Payer: Medicaid Other

## 2011-11-14 ENCOUNTER — Encounter (HOSPITAL_COMMUNITY): Payer: Self-pay | Admitting: *Deleted

## 2011-11-14 ENCOUNTER — Inpatient Hospital Stay (HOSPITAL_COMMUNITY): Payer: Medicaid Other

## 2011-11-14 ENCOUNTER — Inpatient Hospital Stay (HOSPITAL_COMMUNITY)
Admission: EM | Admit: 2011-11-14 | Discharge: 2011-11-28 | DRG: 004 | Disposition: A | Discharge status: Home / Self Care | Payer: Medicaid Other | Attending: Infectious Diseases | Admitting: Infectious Diseases

## 2011-11-14 DIAGNOSIS — E109 Type 1 diabetes mellitus without complications: Secondary | ICD-10-CM

## 2011-11-14 DIAGNOSIS — Z9119 Patient's noncompliance with other medical treatment and regimen: Secondary | ICD-10-CM

## 2011-11-14 DIAGNOSIS — E870 Hyperosmolality and hypernatremia: Secondary | ICD-10-CM | POA: Diagnosis not present

## 2011-11-14 DIAGNOSIS — N179 Acute kidney failure, unspecified: Secondary | ICD-10-CM | POA: Diagnosis present

## 2011-11-14 DIAGNOSIS — G9341 Metabolic encephalopathy: Secondary | ICD-10-CM | POA: Diagnosis present

## 2011-11-14 DIAGNOSIS — F1420 Cocaine dependence, uncomplicated: Secondary | ICD-10-CM | POA: Diagnosis present

## 2011-11-14 DIAGNOSIS — E785 Hyperlipidemia, unspecified: Secondary | ICD-10-CM | POA: Diagnosis present

## 2011-11-14 DIAGNOSIS — E46 Unspecified protein-calorie malnutrition: Secondary | ICD-10-CM | POA: Diagnosis present

## 2011-11-14 DIAGNOSIS — Z87891 Personal history of nicotine dependence: Secondary | ICD-10-CM

## 2011-11-14 DIAGNOSIS — Z833 Family history of diabetes mellitus: Secondary | ICD-10-CM

## 2011-11-14 DIAGNOSIS — F1910 Other psychoactive substance abuse, uncomplicated: Secondary | ICD-10-CM | POA: Diagnosis present

## 2011-11-14 DIAGNOSIS — Z794 Long term (current) use of insulin: Secondary | ICD-10-CM

## 2011-11-14 DIAGNOSIS — I2510 Atherosclerotic heart disease of native coronary artery without angina pectoris: Secondary | ICD-10-CM

## 2011-11-14 DIAGNOSIS — E1069 Type 1 diabetes mellitus with other specified complication: Secondary | ICD-10-CM | POA: Insufficient documentation

## 2011-11-14 DIAGNOSIS — J189 Pneumonia, unspecified organism: Secondary | ICD-10-CM | POA: Diagnosis present

## 2011-11-14 DIAGNOSIS — E1065 Type 1 diabetes mellitus with hyperglycemia: Secondary | ICD-10-CM | POA: Insufficient documentation

## 2011-11-14 DIAGNOSIS — G9340 Encephalopathy, unspecified: Secondary | ICD-10-CM

## 2011-11-14 DIAGNOSIS — F319 Bipolar disorder, unspecified: Secondary | ICD-10-CM | POA: Diagnosis present

## 2011-11-14 DIAGNOSIS — J9600 Acute respiratory failure, unspecified whether with hypoxia or hypercapnia: Secondary | ICD-10-CM | POA: Diagnosis present

## 2011-11-14 DIAGNOSIS — R000 Tachycardia, unspecified: Secondary | ICD-10-CM | POA: Diagnosis present

## 2011-11-14 DIAGNOSIS — E876 Hypokalemia: Secondary | ICD-10-CM | POA: Diagnosis not present

## 2011-11-14 DIAGNOSIS — Z79899 Other long term (current) drug therapy: Secondary | ICD-10-CM

## 2011-11-14 DIAGNOSIS — G936 Cerebral edema: Secondary | ICD-10-CM

## 2011-11-14 DIAGNOSIS — R579 Shock, unspecified: Secondary | ICD-10-CM | POA: Diagnosis present

## 2011-11-14 DIAGNOSIS — F1911 Other psychoactive substance abuse, in remission: Secondary | ICD-10-CM | POA: Insufficient documentation

## 2011-11-14 DIAGNOSIS — Z9089 Acquired absence of other organs: Secondary | ICD-10-CM

## 2011-11-14 DIAGNOSIS — F99 Mental disorder, not otherwise specified: Secondary | ICD-10-CM

## 2011-11-14 DIAGNOSIS — E039 Hypothyroidism, unspecified: Secondary | ICD-10-CM | POA: Diagnosis present

## 2011-11-14 DIAGNOSIS — Z91199 Patient's noncompliance with other medical treatment and regimen due to unspecified reason: Secondary | ICD-10-CM

## 2011-11-14 DIAGNOSIS — F1010 Alcohol abuse, uncomplicated: Secondary | ICD-10-CM | POA: Diagnosis present

## 2011-11-14 DIAGNOSIS — D509 Iron deficiency anemia, unspecified: Secondary | ICD-10-CM | POA: Insufficient documentation

## 2011-11-14 DIAGNOSIS — E1010 Type 1 diabetes mellitus with ketoacidosis without coma: Secondary | ICD-10-CM

## 2011-11-14 DIAGNOSIS — Z7982 Long term (current) use of aspirin: Secondary | ICD-10-CM

## 2011-11-14 LAB — URINALYSIS, ROUTINE W REFLEX MICROSCOPIC
Glucose, UA: >1000 mg/dL — AB
Ketones, ur: 40 mg/dL — AB
Leukocytes, UA: NEGATIVE
pH: 5.5 (ref 5.0–8.0)

## 2011-11-14 LAB — POCT I-STAT 3, ART BLOOD GAS (G3+)
Bicarbonate: 6.3 mEq/L — ABNORMAL LOW (ref 20.0–24.0)
Bicarbonate: 6.2 mEq/L — ABNORMAL LOW (ref 20.0–24.0)
Bicarbonate: 4.7 mEq/L — ABNORMAL LOW (ref 20.0–24.0)
O2 Saturation: 89 %
O2 Saturation: 99 %
Patient temperature: 98.6
Patient temperature: 96.3
TCO2: 7 mmol/L (ref 0–100)
TCO2: 7 mmol/L (ref 0–100)
TCO2: 5 mmol/L (ref 0–100)
pCO2 arterial: 25.7 mmHg — ABNORMAL LOW (ref 35.0–45.0)
pH, Arterial: 7.002 — CL (ref 7.350–7.400)
pH, Arterial: 6.991 — CL (ref 7.350–7.400)
pH, Arterial: 7.167 — CL (ref 7.350–7.400)
pO2, Arterial: 202 mmHg — ABNORMAL HIGH (ref 80.0–100.0)

## 2011-11-14 LAB — CBC
MCV: 70 fL — ABNORMAL LOW (ref 78.0–100.0)
Platelets: 302 10*3/uL (ref 150–400)
RDW: 18.5 % — ABNORMAL HIGH (ref 11.5–15.5)
WBC: 28.8 10*3/uL — ABNORMAL HIGH (ref 4.0–10.5)

## 2011-11-14 LAB — LACTIC ACID, PLASMA: Lactic Acid, Venous: 7.7 mmol/L — ABNORMAL HIGH (ref 0.5–2.2)

## 2011-11-14 LAB — BASIC METABOLIC PANEL
CO2: 5 mEq/L — CL (ref 19–32)
Chloride: 120 mEq/L — ABNORMAL HIGH (ref 96–112)
Potassium: 3.6 mEq/L (ref 3.5–5.1)
Sodium: 153 mEq/L — ABNORMAL HIGH (ref 135–145)

## 2011-11-14 LAB — DIFFERENTIAL
Basophils Absolute: 0 10*3/uL (ref 0.0–0.1)
Lymphs Abs: 1.4 10*3/uL (ref 0.7–4.0)
Monocytes Absolute: 2 10*3/uL — ABNORMAL HIGH (ref 0.1–1.0)
Monocytes Relative: 7 % (ref 3–12)

## 2011-11-14 LAB — RAPID URINE DRUG SCREEN, HOSP PERFORMED
Amphetamines: NOT DETECTED
Opiates: NOT DETECTED

## 2011-11-14 LAB — URINE MICROSCOPIC-ADD ON

## 2011-11-14 LAB — COMPREHENSIVE METABOLIC PANEL
Albumin: 3.2 g/dL — ABNORMAL LOW (ref 3.5–5.2)
BUN: 59 mg/dL — ABNORMAL HIGH (ref 6–23)
Calcium: 9.5 mg/dL (ref 8.4–10.5)
Creatinine, Ser: 3.26 mg/dL — ABNORMAL HIGH (ref 0.50–1.10)
Potassium: 5.3 mEq/L — ABNORMAL HIGH (ref 3.5–5.1)
Total Protein: 6.9 g/dL (ref 6.0–8.3)

## 2011-11-14 LAB — GLUCOSE, CAPILLARY
Glucose-Capillary: >600 mg/dL (ref 70–99)
Glucose-Capillary: >600 mg/dL (ref 70–99)

## 2011-11-14 LAB — PROCALCITONIN: Procalcitonin: 9.15 ng/mL

## 2011-11-14 LAB — TROPONIN I: Troponin I: <0.3 ng/mL (ref <0.30)

## 2011-11-14 LAB — MRSA PCR SCREENING: MRSA by PCR: NEGATIVE

## 2011-11-14 LAB — CARDIAC PANEL(CRET KIN+CKTOT+MB+TROPI): CK, MB: 2.4 ng/mL (ref 0.3–4.0)

## 2011-11-14 MED ORDER — SODIUM BICARBONATE 8.4 % IV SOLN
100.0000 meq | Freq: Once | INTRAVENOUS | Status: AC
  Administered 2011-11-14: 100 meq via INTRAVENOUS
  Filled 2011-11-14: qty 100

## 2011-11-14 MED ORDER — SODIUM CHLORIDE 0.9 % IV SOLN
50.0000 ug/h | INTRAVENOUS | Status: DC
  Administered 2011-11-15: 100 ug/h via INTRAVENOUS
  Administered 2011-11-15: 50 ug/h via INTRAVENOUS
  Administered 2011-11-16 (×2): 300 ug/h via INTRAVENOUS
  Administered 2011-11-17: 400 ug/h via INTRAVENOUS
  Administered 2011-11-17: 300 ug/h via INTRAVENOUS
  Administered 2011-11-17 – 2011-11-18 (×3): 400 ug/h via INTRAVENOUS
  Filled 2011-11-14 (×9): qty 50

## 2011-11-14 MED ORDER — VANCOMYCIN HCL IN DEXTROSE 1-5 GM/200ML-% IV SOLN
1000.0000 mg | INTRAVENOUS | Status: AC
  Administered 2011-11-14: 1000 mg via INTRAVENOUS
  Filled 2011-11-14: qty 200

## 2011-11-14 MED ORDER — SIMVASTATIN 40 MG PO TABS
40.0000 mg | ORAL_TABLET | Freq: Every day | ORAL | Status: DC
  Filled 2011-11-14: qty 1

## 2011-11-14 MED ORDER — SODIUM CHLORIDE 0.9 % IV SOLN
INTRAVENOUS | Status: AC
  Administered 2011-11-14: 20:00:00 via INTRAVENOUS

## 2011-11-14 MED ORDER — DEXTROSE-NACL 5-0.45 % IV SOLN: INTRAVENOUS | Status: DC

## 2011-11-14 MED ORDER — FENTANYL CITRATE 0.05 MG/ML IJ SOLN
100.0000 ug | INTRAMUSCULAR | Status: DC | PRN
  Administered 2011-11-14: 100 ug via INTRAVENOUS
  Filled 2011-11-14: qty 2

## 2011-11-14 MED ORDER — CALCIUM GLUCONATE 10 % IV SOLN
INTRAVENOUS | Status: AC
  Filled 2011-11-14: qty 20

## 2011-11-14 MED ORDER — POTASSIUM CHLORIDE 10 MEQ/50ML IV SOLN
INTRAVENOUS | Status: AC
  Administered 2011-11-14: 10 meq
  Filled 2011-11-14: qty 100

## 2011-11-14 MED ORDER — PANTOPRAZOLE SODIUM 40 MG IV SOLR
40.0000 mg | INTRAVENOUS | Status: DC
  Filled 2011-11-14: qty 40

## 2011-11-14 MED ORDER — VANCOMYCIN HCL 1000 MG IV SOLR
750.0000 mg | INTRAVENOUS | Status: DC
  Administered 2011-11-15 – 2011-11-17 (×3): 750 mg via INTRAVENOUS
  Filled 2011-11-14 (×3): qty 750

## 2011-11-14 MED ORDER — DEXTROSE 50 % IV SOLN
25.0000 mL | INTRAVENOUS | Status: DC | PRN
  Administered 2011-11-16: 21 mL via INTRAVENOUS
  Filled 2011-11-14: qty 50

## 2011-11-14 MED ORDER — SODIUM CHLORIDE 0.9 % IV BOLUS (SEPSIS)
1000.0000 mL | Freq: Once | INTRAVENOUS | Status: AC
  Administered 2011-11-14: 1000 mL via INTRAVENOUS

## 2011-11-14 MED ORDER — POTASSIUM CHLORIDE 10 MEQ/50ML IV SOLN
10.0000 meq | INTRAVENOUS | Status: AC
  Administered 2011-11-14 – 2011-11-15 (×2): 10 meq via INTRAVENOUS

## 2011-11-14 MED ORDER — SODIUM BICARBONATE 8.4 % IV SOLN
50.0000 meq | Freq: Once | INTRAVENOUS | Status: AC
  Administered 2011-11-14: 50 meq via INTRAVENOUS

## 2011-11-14 MED ORDER — FENTANYL CITRATE 0.05 MG/ML IJ SOLN
INTRAMUSCULAR | Status: AC
  Administered 2011-11-14: 100 ug
  Filled 2011-11-14: qty 2

## 2011-11-14 MED ORDER — FENTANYL BOLUS VIA INFUSION
50.0000 ug | Freq: Four times a day (QID) | INTRAVENOUS | Status: DC | PRN
  Administered 2011-11-16: 75 ug via INTRAVENOUS
  Filled 2011-11-14: qty 100

## 2011-11-14 MED ORDER — SODIUM CHLORIDE 0.9 % IV SOLN
INTRAVENOUS | Status: AC
  Filled 2011-11-14: qty 200

## 2011-11-14 MED ORDER — PIPERACILLIN-TAZOBACTAM 3.375 G IVPB
3.3750 g | Freq: Three times a day (TID) | INTRAVENOUS | Status: DC
  Administered 2011-11-14: 3.375 g via INTRAVENOUS
  Filled 2011-11-14 (×3): qty 50

## 2011-11-14 MED ORDER — SODIUM CHLORIDE 0.9 % IV SOLN: INTRAVENOUS | Status: DC

## 2011-11-14 MED ORDER — SODIUM CHLORIDE 0.9 % IV SOLN
INTRAVENOUS | Status: DC
  Filled 2011-11-14 (×2): qty 1

## 2011-11-14 MED ORDER — SODIUM BICARBONATE 8.4 % IV SOLN
INTRAVENOUS | Status: DC
  Administered 2011-11-14: via INTRAVENOUS
  Filled 2011-11-14 (×3): qty 150

## 2011-11-14 MED ORDER — CHLORHEXIDINE GLUCONATE 0.12 % MT SOLN
15.0000 mL | Freq: Two times a day (BID) | OROMUCOSAL | Status: DC
  Administered 2011-11-15 – 2011-11-21 (×14): 15 mL via OROMUCOSAL
  Filled 2011-11-14 (×13): qty 15

## 2011-11-14 MED ORDER — BIOTENE DRY MOUTH MT LIQD
15.0000 mL | Freq: Four times a day (QID) | OROMUCOSAL | Status: DC
  Administered 2011-11-15 – 2011-11-21 (×27): 15 mL via OROMUCOSAL

## 2011-11-14 MED ORDER — POTASSIUM CHLORIDE 10 MEQ/100ML IV SOLN: 10.0000 meq | INTRAVENOUS | Status: DC

## 2011-11-14 MED ORDER — SODIUM CHLORIDE 0.9 % IV SOLN
INTRAVENOUS | Status: DC
  Administered 2011-11-14: 5.4 [IU]/h via INTRAVENOUS
  Filled 2011-11-14: qty 1

## 2011-11-14 MED ORDER — INSULIN ASPART 100 UNIT/ML ~~LOC~~ SOLN
10.0000 [IU] | Freq: Once | SUBCUTANEOUS | Status: AC
  Administered 2011-11-14: 10 [IU] via INTRAVENOUS

## 2011-11-14 MED ORDER — DILTIAZEM HCL 25 MG/5ML IV SOLN: 10.0000 mg | Freq: Once | INTRAVENOUS | Status: DC

## 2011-11-14 MED ORDER — SODIUM CHLORIDE 0.9 % IV SOLN
Freq: Once | INTRAVENOUS | Status: AC
  Administered 2011-11-14: 999 mL via INTRAVENOUS

## 2011-11-14 MED ORDER — INSULIN ASPART 100 UNIT/ML ~~LOC~~ SOLN
SUBCUTANEOUS | Status: AC
  Filled 2011-11-14: qty 1

## 2011-11-14 MED ORDER — POTASSIUM CHLORIDE 20 MEQ/15ML (10%) PO LIQD
20.0000 meq | Freq: Once | ORAL | Status: AC
  Administered 2011-11-14: 20 meq
  Filled 2011-11-14: qty 15

## 2011-11-14 MED ORDER — MIDAZOLAM HCL 2 MG/2ML IJ SOLN
INTRAMUSCULAR | Status: AC
  Administered 2011-11-14: 2 mg
  Filled 2011-11-14: qty 2

## 2011-11-14 MED ORDER — HEPARIN SODIUM (PORCINE) 5000 UNIT/ML IJ SOLN
5000.0000 [IU] | Freq: Three times a day (TID) | INTRAMUSCULAR | Status: DC
  Administered 2011-11-14 – 2011-11-16 (×5): 5000 [IU] via SUBCUTANEOUS
  Filled 2011-11-14 (×9): qty 1

## 2011-11-14 MED ORDER — MIDAZOLAM HCL 2 MG/2ML IJ SOLN
2.0000 mg | INTRAMUSCULAR | Status: DC | PRN
  Administered 2011-11-15 – 2011-11-18 (×10): 2 mg via INTRAVENOUS
  Filled 2011-11-14 (×11): qty 2

## 2011-11-14 MED ORDER — SODIUM CHLORIDE 0.45 % IV SOLN
INTRAVENOUS | Status: DC
  Administered 2011-11-14: 19:00:00 via INTRAVENOUS
  Filled 2011-11-14 (×2): qty 1000

## 2011-11-14 MED ORDER — SODIUM BICARBONATE 8.4 % IV SOLN
INTRAVENOUS | Status: AC
  Filled 2011-11-14: qty 100

## 2011-11-14 MED ORDER — SODIUM CHLORIDE 0.9 % IV SOLN: Freq: Once | INTRAVENOUS | Status: DC

## 2011-11-14 NOTE — ED Notes (Signed)
Pt starting to respond; nasal trumpet removed from left nare

## 2011-11-14 NOTE — ED Notes (Signed)
Pt is responsive to pain with insertion of central line.

## 2011-11-14 NOTE — Progress Notes (Signed)
eLink Physician-Brief Progress Note Patient Name: [name] DOB: January 28, 1966 MRN: [id number]  Date of Service  11/14/2011   HPI/Events of Note   Called for low BP, pH 6.9 on gas, KCL 3.6  eICU Interventions   Reviewed orders, previous labs.  Replaced KCl, restarted Na Bicarb infusion and giving 2 amps push now.  Will also bolus IVF with NS now.  If BP not improved will need levophed.  Watch K closely with repeat BMET at 0130.      Troy Hartzog 11/14/2011, 11:32 PM

## 2011-11-14 NOTE — ED Notes (Signed)
Calcium chloride given via triple lumen

## 2011-11-14 NOTE — Procedures (Signed)
Name: Nakina Spatz MRN: [id number] DOB: August 02, 1965  DOS:   PROCEDURE NOTE  Procedure:  Endotracheal intubation.  Indication:  Acute respiratory failure  Consent:  Consent was implied due to the emergency nature of the procedure.  Anesthesia:  Etomidate / Rocuronium  Procedure summary:  Appropriate equipment was assembled. The patient was identified as Herbalist and safety timeout was performed. The patient was placed supine, with head in sniffing position. After adequate level of anesthesia was achieved, a Glide Scope #3 blade was inserted into the oropharynx and the vocal cords were visualized. A 7.5 endotracheal tube was inserted without difficulty and visualized going through the vocal cords. The stylette was removed and cuff inflated. Colorimetric change was noted on the CO2 meter. Breath sounds were heard over both lung fields equally. Post procedure chest xray was ordered.  Complications:  No immediate complications were noted.  Hemodynamic parameters and oxygenation remained stable throughout the procedure.    Orlean Bradford, M.D., P.C.C.M. Pulmonary and Critical Care Medicine [facility] Cell: [phone] Pager: [phone]  11/14/2011, 8:44 PM
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
## 2011-11-14 NOTE — ED Notes (Signed)
To CT with RN and on CR monitor
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
## 2011-11-14 NOTE — ED Notes (Signed)
90/58 and hr 130s

## 2011-11-14 NOTE — ED Notes (Signed)
Unsuccessful IV attempts peripherially and Labs drawn femorally.  Planning for Central line insertion.

## 2011-11-14 NOTE — ED Notes (Signed)
Pt has elevated potassium per md.  Attempting to place central line now.  Dr. Weldon Inches at bedside

## 2011-11-14 NOTE — ED Notes (Signed)
[name]--daughter [phone]
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
## 2011-11-15 DIAGNOSIS — G9340 Encephalopathy, unspecified: Secondary | ICD-10-CM

## 2011-11-15 HISTORY — DX: Encephalopathy, unspecified: G93.40

## 2011-11-15 LAB — GLUCOSE, CAPILLARY
Glucose-Capillary: 553 mg/dL (ref 70–99)
Glucose-Capillary: 403 mg/dL — ABNORMAL HIGH (ref 70–99)
Glucose-Capillary: 270 mg/dL — ABNORMAL HIGH (ref 70–99)
Glucose-Capillary: 197 mg/dL — ABNORMAL HIGH (ref 70–99)
Glucose-Capillary: 202 mg/dL — ABNORMAL HIGH (ref 70–99)
Glucose-Capillary: 169 mg/dL — ABNORMAL HIGH (ref 70–99)
Glucose-Capillary: 143 mg/dL — ABNORMAL HIGH (ref 70–99)
Glucose-Capillary: 113 mg/dL — ABNORMAL HIGH (ref 70–99)
Glucose-Capillary: 112 mg/dL — ABNORMAL HIGH (ref 70–99)
Glucose-Capillary: 124 mg/dL — ABNORMAL HIGH (ref 70–99)

## 2011-11-15 LAB — T4, FREE: Free T4: 0.71 ng/dL — ABNORMAL LOW (ref 0.80–1.80)

## 2011-11-15 LAB — CARDIAC PANEL(CRET KIN+CKTOT+MB+TROPI)
CK, MB: 7.3 ng/mL (ref 0.3–4.0)
Total CK: 151 U/L (ref 7–177)
Total CK: 421 U/L — ABNORMAL HIGH (ref 7–177)
Troponin I: 0.5 ng/mL (ref <0.30)

## 2011-11-15 LAB — POCT I-STAT 3, ART BLOOD GAS (G3+)
Acid-base deficit: 9 mmol/L — ABNORMAL HIGH (ref 0.0–2.0)
Bicarbonate: 11.8 mEq/L — ABNORMAL LOW (ref 20.0–24.0)
Bicarbonate: 20.8 mEq/L (ref 20.0–24.0)
Patient temperature: 99.7
Patient temperature: 99.1
TCO2: 12 mmol/L (ref 0–100)
TCO2: 22 mmol/L (ref 0–100)
pH, Arterial: 7.348 — ABNORMAL LOW (ref 7.350–7.400)
pO2, Arterial: 177 mmHg — ABNORMAL HIGH (ref 80.0–100.0)
pO2, Arterial: 150 mmHg — ABNORMAL HIGH (ref 80.0–100.0)

## 2011-11-15 LAB — BASIC METABOLIC PANEL
BUN: 37 mg/dL — ABNORMAL HIGH (ref 6–23)
BUN: 36 mg/dL — ABNORMAL HIGH (ref 6–23)
BUN: 32 mg/dL — ABNORMAL HIGH (ref 6–23)
CO2: 11 mEq/L — ABNORMAL LOW (ref 19–32)
CO2: 11 mEq/L — ABNORMAL LOW (ref 19–32)
CO2: 12 mEq/L — ABNORMAL LOW (ref 19–32)
CO2: 20 mEq/L (ref 19–32)
Calcium: 7.6 mg/dL — ABNORMAL LOW (ref 8.4–10.5)
Chloride: 127 mEq/L — ABNORMAL HIGH (ref 96–112)
Chloride: 129 mEq/L — ABNORMAL HIGH (ref 96–112)
Chloride: >130 mEq/L (ref 96–112)
Creatinine, Ser: 1.7 mg/dL — ABNORMAL HIGH (ref 0.50–1.10)
Creatinine, Ser: 1.73 mg/dL — ABNORMAL HIGH (ref 0.50–1.10)
Creatinine, Ser: 1.81 mg/dL — ABNORMAL HIGH (ref 0.50–1.10)
GFR calc Af Amer: 40 mL/min — ABNORMAL LOW (ref >90)
GFR calc Af Amer: 39 mL/min — ABNORMAL LOW (ref >90)
GFR calc non Af Amer: 34 mL/min — ABNORMAL LOW (ref >90)
Glucose, Bld: 348 mg/dL — ABNORMAL HIGH (ref 70–99)
Glucose, Bld: 224 mg/dL — ABNORMAL HIGH (ref 70–99)
Glucose, Bld: 213 mg/dL — ABNORMAL HIGH (ref 70–99)
Potassium: 2.7 mEq/L — CL (ref 3.5–5.1)
Potassium: 2.5 mEq/L — CL (ref 3.5–5.1)
Potassium: 3.4 mEq/L — ABNORMAL LOW (ref 3.5–5.1)
Sodium: 161 mEq/L (ref 135–145)
Sodium: 160 mEq/L — ABNORMAL HIGH (ref 135–145)

## 2011-11-15 LAB — CARBOXYHEMOGLOBIN: O2 Saturation: 75.1 %

## 2011-11-15 LAB — MAGNESIUM: Magnesium: 2 mg/dL (ref 1.5–2.5)

## 2011-11-15 LAB — TSH: TSH: 5.328 u[IU]/mL — ABNORMAL HIGH (ref 0.350–4.500)

## 2011-11-15 LAB — T3: T3, Total: 45.1 ng/dl — ABNORMAL LOW (ref 80.0–204.0)

## 2011-11-15 MED ORDER — SODIUM CHLORIDE 0.9 % IV BOLUS (SEPSIS)
500.0000 mL | Freq: Once | INTRAVENOUS | Status: AC
  Administered 2011-11-15: 500 mL via INTRAVENOUS

## 2011-11-15 MED ORDER — SODIUM CHLORIDE 0.9 % IV SOLN
500.0000 mg | Freq: Once | INTRAVENOUS | Status: DC
  Filled 2011-11-15: qty 500

## 2011-11-15 MED ORDER — NOREPINEPHRINE BITARTRATE 1 MG/ML IJ SOLN
2.0000 ug/min | INTRAVENOUS | Status: DC | PRN
  Administered 2011-11-15: 10 ug/min via INTRAVENOUS
  Filled 2011-11-15: qty 16

## 2011-11-15 MED ORDER — LATANOPROST 0.005 % OP SOLN
1.0000 [drp] | Freq: Every day | OPHTHALMIC | Status: DC
  Administered 2011-11-15 – 2011-11-28 (×13): 1 [drp] via OPHTHALMIC
  Filled 2011-11-15 (×4): qty 2.5

## 2011-11-15 MED ORDER — ASPIRIN 81 MG PO CHEW
81.0000 mg | CHEWABLE_TABLET | Freq: Every day | ORAL | Status: DC
  Administered 2011-11-15 – 2011-11-28 (×12): 81 mg
  Filled 2011-11-15 (×14): qty 1

## 2011-11-15 MED ORDER — DOBUTAMINE IN D5W 4-5 MG/ML-% IV SOLN
2.5000 ug/kg/min | INTRAVENOUS | Status: DC | PRN
  Filled 2011-11-15: qty 250

## 2011-11-15 MED ORDER — FREE WATER
200.0000 mL | Freq: Three times a day (TID) | Status: DC
  Administered 2011-11-15 – 2011-11-17 (×7): 200 mL

## 2011-11-15 MED ORDER — SODIUM CHLORIDE 0.9 % IV BOLUS (SEPSIS): 500.0000 mL | INTRAVENOUS | Status: DC | PRN

## 2011-11-15 MED ORDER — INSULIN ASPART 100 UNIT/ML ~~LOC~~ SOLN: 0.0000 [IU] | SUBCUTANEOUS | Status: DC

## 2011-11-15 MED ORDER — DEXTROSE 5 % IV SOLN
INTRAVENOUS | Status: AC
  Administered 2011-11-15: 15:00:00 via INTRAVENOUS
  Administered 2011-11-15: 100 mL via INTRAVENOUS
  Administered 2011-11-16: 10:00:00 via INTRAVENOUS

## 2011-11-15 MED ORDER — PIPERACILLIN-TAZOBACTAM IN DEX 2-0.25 GM/50ML IV SOLN
2.2500 g | Freq: Four times a day (QID) | INTRAVENOUS | Status: DC
  Administered 2011-11-15: 2.25 g via INTRAVENOUS
  Filled 2011-11-15 (×3): qty 50

## 2011-11-15 MED ORDER — INSULIN GLARGINE 100 UNIT/ML ~~LOC~~ SOLN
20.0000 [IU] | Freq: Every day | SUBCUTANEOUS | Status: DC
  Administered 2011-11-15: 20 [IU] via SUBCUTANEOUS

## 2011-11-15 MED ORDER — LACTULOSE 10 GM/15ML PO SOLN
20.0000 g | Freq: Two times a day (BID) | ORAL | Status: DC
  Administered 2011-11-15 (×2): 20 g
  Filled 2011-11-15 (×4): qty 30

## 2011-11-15 MED ORDER — POTASSIUM CHLORIDE 10 MEQ/50ML IV SOLN
10.0000 meq | INTRAVENOUS | Status: AC
  Administered 2011-11-15 (×6): 10 meq via INTRAVENOUS
  Filled 2011-11-15: qty 50
  Filled 2011-11-15: qty 150
  Filled 2011-11-15: qty 300

## 2011-11-15 MED ORDER — POTASSIUM CHLORIDE 20 MEQ/15ML (10%) PO LIQD
60.0000 meq | Freq: Two times a day (BID) | ORAL | Status: DC
  Administered 2011-11-15: 60 meq
  Filled 2011-11-15 (×2): qty 45

## 2011-11-15 MED ORDER — INSULIN ASPART 100 UNIT/ML ~~LOC~~ SOLN
0.0000 [IU] | SUBCUTANEOUS | Status: DC
  Administered 2011-11-15 (×2): 4 [IU] via SUBCUTANEOUS

## 2011-11-15 MED ORDER — SODIUM CHLORIDE 0.9 % IV SOLN
INTRAVENOUS | Status: DC
  Filled 2011-11-15: qty 1

## 2011-11-15 MED ORDER — PANTOPRAZOLE SODIUM 40 MG PO PACK
40.0000 mg | PACK | Freq: Every day | ORAL | Status: DC
  Administered 2011-11-15 – 2011-11-21 (×7): 40 mg
  Filled 2011-11-15 (×10): qty 20

## 2011-11-15 MED ORDER — GLUCERNA 1.2 CAL PO LIQD
1000.0000 mL | ORAL | Status: DC
  Administered 2011-11-15: 20 mL/h
  Administered 2011-11-17 – 2011-11-20 (×4): 1000 mL
  Filled 2011-11-15 (×15): qty 1000

## 2011-11-15 MED ORDER — DEXTROSE 10 % IV SOLN: INTRAVENOUS | Status: DC

## 2011-11-15 MED ORDER — PIPERACILLIN-TAZOBACTAM 3.375 G IVPB
3.3750 g | Freq: Four times a day (QID) | INTRAVENOUS | Status: DC
  Administered 2011-11-15 – 2011-11-16 (×4): 3.375 g via INTRAVENOUS
  Filled 2011-11-15 (×6): qty 50

## 2011-11-15 MED ORDER — DEXTROSE 5 % IV SOLN
2.0000 ug/min | INTRAVENOUS | Status: DC
  Filled 2011-11-15: qty 4

## 2011-11-15 MED ORDER — SODIUM PHOSPHATE 3 MMOLE/ML IV SOLN: 30.0000 mmol | Freq: Once | INTRAVENOUS | Status: DC

## 2011-11-15 MED ORDER — K PHOS MONO-SOD PHOS DI & MONO 155-852-130 MG PO TABS
500.0000 mg | ORAL_TABLET | ORAL | Status: AC
  Administered 2011-11-15 – 2011-11-16 (×3): 500 mg via ORAL
  Filled 2011-11-15 (×4): qty 2

## 2011-11-15 NOTE — Progress Notes (Addendum)
[name] is a 46 y.o. female smoker admitted on 11/14/2011 with metabolic encephalopathy 2nd to hyperglycemia/hypernatremia/acidosis in setting of DM type I with non-compliance complicated by respiratory failure and shock.   Hx of Hypothyroidism, Bipolar, organic brain syndrome, Hyperlipidemia, CAD, Cocaine/ETOH abuse, HTN, Syphilis    Line/tube: Rt IJ CVL 4/30>> ETT 4/20>> Lt radial Aline 4/30>>   Cultures: Urine 4/30>> Blood 4/30>> Sputum>>  Antibiotics: Vancomycin 4/30>> Zosyn 4/30>>  Tests/events: 4/30 CT head>>negative  SUBJECTIVE: Remains on pressors, insulin gtt.  OBJECTIVE:  Blood pressure 100/60, pulse 111, temperature 100.3 F (37.9 C), temperature source Core (Comment), resp. rate 30, height 5\' 2"  (1.575 m), weight 123 lb 14.4 oz (56.2 kg), last menstrual period 11/01/2011, SpO2 100.00%. Wt Readings from Last 3 Encounters:  11/14/11 123 lb 14.4 oz (56.2 kg)  07/25/11 137 lb 4.8 oz (62.279 kg)  04/04/11 138 lb 3.2 oz (62.687 kg)   Body mass index is 22.66 kg/(m^2).  I/O last 3 completed shifts: In: 4736.2 [I.V.:3806.2; NG/GT:180; IV Piggyback:750] Out: 2735 [Urine:2735]  Vent Mode:  [-] PRVC FiO2 (%):  [30 %-40 %] 30 % Set Rate:  [14 bmp-30 bmp] 14 bmp Vt Set:  [400 mL-600 mL] 400 mL PEEP:  [5 cmH20] 5 cmH20 Plateau Pressure:  [29 cmH20] 29 cmH20  Physical Exam: General - no distress HEENT - pupils anisocoria, ETT in place Cardiac - s1s2 tachycardic/regular, no murmur Chest - no wheeze Abd - soft, mild tenderness, decreased bowel sounds Ext - no edema Neuro - sedated  CBC    Component Value Date/Time   WBC 28.8* 11/14/2011 1827   RBC 5.36* 11/14/2011 1827   HGB 11.0* 11/14/2011 1827   HCT 37.5 11/14/2011 1827   PLT 302 11/14/2011 1827   MCV 70.0* 11/14/2011 1827   MCH 20.5* 11/14/2011 1827   MCHC 29.3* 11/14/2011 1827   RDW 18.5* 11/14/2011 1827   LYMPHSABS 1.4 11/14/2011 1827   MONOABS 2.0* 11/14/2011 1827   EOSABS 0.0 11/14/2011 1827   BASOSABS  0.0 11/14/2011 1827    BMET    Component Value Date/Time   NA 160* 11/15/2011 0700   K 3.4* 11/15/2011 0700   CL 129* 11/15/2011 0700   CO2 12* 11/15/2011 0700   GLUCOSE 213* 11/15/2011 0700   BUN 36* 11/15/2011 0700   CREATININE 1.70* 11/15/2011 0700   CALCIUM 8.0* 11/15/2011 0700   GFRNONAA 35* 11/15/2011 0700   GFRAA 40* 11/15/2011 0700    Lab Results  Component Value Date   ALT 10 11/14/2011   AST 19 11/14/2011   ALKPHOS 88 11/14/2011   BILITOT 0.1* 11/14/2011   ABG    Component Value Date/Time   PHART 7.559* 11/15/2011 0454   PCO2ART 13.2* 11/15/2011 0454   PO2ART 177.0* 11/15/2011 0454   HCO3 11.8* 11/15/2011 0454   TCO2 12 11/15/2011 0454   ACIDBASEDEF 9.0* 11/15/2011 0454   O2SAT 100.0 11/15/2011 0454    Ct Head Wo Contrast  11/14/2011  *RADIOLOGY REPORT*  Clinical Data: Hyperglycemia, altered mental status  CT HEAD WITHOUT CONTRAST  Technique:  Contiguous axial images were obtained from the base of the skull through the vertex without contrast.  Comparison: 03/28/2010; 03/20/2010  Findings:  Normal gray-white differentiation.  No CT evidence of acute large territory infarct.  Normal size and configuration of the ventricles and basilar cisterns.  No midline shift.  No intraparenchymal or extra-axial mass or hemorrhage.  Paranasal sinuses and mastoid air cells are normal. Post bilateral cataract surgery.  IMPRESSION: Negative noncontrast head CT.  Original Report Authenticated By: Waynard Reeds, M.D.   Dg Chest Port 1 View  11/14/2011  *RADIOLOGY REPORT*  Clinical Data: 46 year old female intubated, tube adjustment.  PORTABLE CHEST - 1 VIEW  Comparison: 2015 hours the same day and earlier.  Findings: AP portable semi upright view 2025 hours. Endotracheal tube tip in good position between the level of the clavicles and carina.  Enteric tube courses to the abdomen, tip not included.  Stable right IJ central line tip, likely at the right atrium level. No pneumothorax.  Stable lung volumes.  No pulmonary  edema, pleural effusion or confluent opacity.  IMPRESSION: 1.  Endotracheal tube tip in good position.  Other lines and tubes are stable. 2. No acute cardiopulmonary abnormality.  Original Report Authenticated By: Harley Hallmark, M.D.   Dg Chest Port 1 View  11/14/2011  *RADIOLOGY REPORT*  Clinical Data: Intubation  PORTABLE CHEST - 1 VIEW  Comparison: Portable exam 2015 hours compared to 1720 hours  Findings: Tip of endotracheal tube within right mainstem bronchus, recommend withdrawal approximately 4.5 to 5.0 cm for positioning 2 cm above carina. Nasogastric tube within stomach. Right jugular central venous catheter, tip projecting over SVC. Normal heart size, mediastinal contours, pulmonary vascularity. Lungs clear. Pleural effusion or pneumothorax.  IMPRESSION: Right mainstem bronchus intubation, recommend withdrawal 4.5-5.0 cm.  Findings called to Sharen Hones in ED at 2026 hours on 11/14/2011.  Original Report Authenticated By: Lollie Marrow, M.D.   Dg Chest Portable 1 View  11/14/2011  *RADIOLOGY REPORT*  Clinical Data: Hyperglycemia and altered mental status  PORTABLE CHEST - 1 VIEW  Comparison: 02/07/2010  Findings: Normal heart size.  Clear lungs.  IMPRESSION: Negative.  Original Report Authenticated By: Donavan Burnet, M.D.   Dg Chest Port 1v Same Day  11/14/2011  *RADIOLOGY REPORT*  Clinical Data: 46 year old female with central line placement.  PORTABLE CHEST - 1 VIEW SAME DAY  Comparison: 1640 hours the same day and earlier.  Findings: Portable spine AP view 1720 hours.  Right IJ central line placed.  Tip projects about 6 cm below the level of the carina.  No pneumothorax.  Stable lung volumes.  The lungs remain clear. Stable cardiac size and mediastinal contours.  IMPRESSION: Right IJ central line tip projects at the level of the right atrium.  This could be retracted 1-2 cm to allow for cavoatrial junction or lower SVC level placement. No pneumothorax.  Original Report Authenticated By:  Harley Hallmark, M.D.    ASSESSMENT/PLAN:  Hyperosmolar, DKA with hx of type 1 DM  -insulin gtt until anion gap corrects -continue dextrose in IV fluid -monitor electrolytes closely  Acute respiratory failure 2nd to altered mental status, acidosis.  Hx of smoking. -adjust RR to 14, and Vt to 8 cc/kg (400) -f/u CXR and ABG -full vent support until more stable -add bronchodilator therapy  Shock 2nd to hypovolemia, ?sepsis, and probable PEEPi -vent settings adjusted to avoid PEEPi -wean off pressors to keep SBP > 90 -continue IV fluids -D2/x vancomycin, zosyn>>narrow abx once culture results finalized -d/c sepsis protocol>>more likely shock from hypovolemia and no obvious source of infection  Acute renal failure 2nd to hypovolemia, and shock.  Baseline creatinine 1.1 from January 2012. -continue volume resuscitation -monitor renal fx and urine outpt  Anion gap metabolic acidosis from DKA, and lactic acidosis -f/u ABG, BMET, lactic acid  Abdominal pain -check lipase -monitor clinically  Hypernatremia -add free water tube flushes -continue D5W IV fluid -f/u BMET q4hrs for  now  Acute metabolic encephalopathy 2nd to hyperglycemia, acidosis, hypotension, elevated ammonia.  Hx of substance abuse, bipolar, organic brain syndrome.  UDS positive cocaine. -sedation protocol while on vent>>goal RASS -1 -hold depakote, risperdal for now -add lactulose per tube  Protein calorie malnutrition -will ask nutrition to assess/start tube feeds  Hx of CAD, hyperlipidemia -hold simvastatin due to possibility of liver dysfx -resume ASA 81 mg qdaily  Reported hx of hypothyroidism>>thyroid replacement not on outpt med list -f/u TSH, FT4, T3  Best practice -Resume xalatan 0.005% one gtt each eye qhs -protonix for SUP -heparin SQ for DVT proph  Critical care time 35 minutes  Savannah Morford Pager:  [phone] 11/15/2011, 8:44 AM

## 2011-11-15 NOTE — Progress Notes (Signed)
UR Completed.  Lylith Bebeau Jane [phone] 11/15/2011  

## 2011-11-15 NOTE — Progress Notes (Signed)
eLink Physician-Brief Progress Note Patient Name: Kirsta Probert DOB: 1966/07/11 MRN: [id number]  Date of Service  11/15/2011   HPI/Events of Note   Ongoing shock, likely represents septic shock considering WBC of 28K  eICU Interventions  No antibiotic changes needed Start sepsis protocol but no more fluid boluses considering IVF given already in setting of HONK/DKA   Intervention Category Major Interventions: Shock - evaluation and management  Fariha Goto 11/15/2011, 1:41 AM

## 2011-11-15 NOTE — Progress Notes (Signed)
eLink Physician-Brief Progress Note Patient Name: [name] DOB: 10/14/1965 MRN: [id number]  Date of Service  11/15/2011   HPI/Events of Note   Chem reviewed  eICU Interventions  Continued free water, follow Na, K improved   Intervention Category Major Interventions: Electrolyte abnormality - evaluation and management  Nicole Grattan J. 11/15/2011, 6:52 PM

## 2011-11-15 NOTE — Progress Notes (Signed)
Inpatient Diabetes Program Recommendations  AACE/ADA: New Consensus Statement on Inpatient Glycemic Control (2009)  Target Ranges:  Prepandial:   less than 140 mg/dL      Peak postprandial:   less than 180 mg/dL (1-2 hours)      Critically ill patients:  140 - 180 mg/dL   Reason for Visit: Note patient was admitted with DKA.  It appears her PCP is [facility].  Her home diabetes regimen was Lantus 8 units q HS and Novolog 15 units with breakfast, and 8 units with lunch and dinner.  Currently patient is still on IV insulin and on ventilator.  Agree with continuation of IV insulin.  When transition to SQ insulin is considered, will need to receive basal insulin (Lantus) at least 2 hours prior to d/c of insulin drip. Note patient has Type 1 diabetes and is fairly sensitive to insulin.      Note: Will follow.

## 2011-11-15 NOTE — ED Provider Notes (Signed)
I saw and evaluated the patient, reviewed the resident's note and I agree with the findings and plan. 5 y female with hx of dm present with ams.  Hx provided by ems. Level 5 caveat for ams.   Pt unresponsive to sternal rub but maintains airway.  cbg very  High. Incr. K+ 7.8.  Tachy with wide complex   Procedures Femoral vein lab draw because no vasc access obtained by nurses and phlebotomists  Right IJ vein central line insertion  Caveat. Urgent need for intervention, ams Triple lumen placed by resident with [place] guidance No complications  tx of hyperk Calcium  Insulin Bicarb   Iv insulin gtt for hyperglycemia and ams IVF boluses   He slowed and wide complex narrowed.   Spoke with critical care.  They will come admit.   CRITICAL CARE Performed by: Nicholes Stairs   Total critical care time: 30 min  Critical care time was exclusive of separately billable procedures and treating other patients.  Critical care was necessary to treat or prevent imminent or life-threatening deterioration.  Critical care was time spent personally by me on the following activities: development of treatment plan with patient and/or surrogate as well as nursing, discussions with consultants, evaluation of patient's response to treatment, examination of patient, obtaining history from patient or surrogate, ordering and performing treatments and interventions, ordering and review of laboratory studies, ordering and review of radiographic studies, pulse oximetry and re-evaluation of patient's condition.  Cheri Guppy, MD 11/15/11 [phone]
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
## 2011-11-15 NOTE — Progress Notes (Signed)
eLink Physician-Brief Progress Note Patient Name: [name] DOB: 04-30-1966 MRN: [id number]  Date of Service  11/15/2011   HPI/Events of Note   Delirium- improving as starting to wake up Hypokalemia- due to dka, insulin infusino Shock- improving, cvp now 9-11 Acidosis- improving DKA/HONK- Glucose improved Hypernatremia- 161, likely near this at admission (masked by hyperglycemia)  eICU Interventions     Hypokalemia- start aggressive KCl replacement (IV and BID scheduled) Acidosis- stop bicarb gtt DKA/HONK- cont insulin gtt, repeat bmet at 1000 Hypernatremia- free water deficit 5.1 L, start D5W at 100cc hour which should correct Na by 10-23mEq in 24 hours; cont q6h BMET to monitor for overcorrection   Intervention Category Major Interventions: Acid-Base disturbance - evaluation and management;Shock - evaluation and management;Delirium, psychosis, severe agitation - evaluation and management;Electrolyte abnormality - evaluation and management  Taos Tapp 11/15/2011, 4:37 AM

## 2011-11-15 NOTE — Progress Notes (Signed)
ANTIBIOTIC CONSULT NOTE - FOLLOW UP  Pharmacy Consult for Zosyn Indication: rule out sepsis  No Known Allergies  Patient Measurements: Height: 5\' 2"  (157.5 cm) Weight: 123 lb 14.4 oz (56.2 kg) IBW/kg (Calculated) : 50.1  Vital Signs: Temp: 100.3 F (37.9 C) (05/01 0800) Temp src: Core (Comment) (05/01 0740) BP: 100/60 mmHg (05/01 0800) Pulse Rate: 111  (05/01 0800) Intake/Output from previous day: 04/30 0701 - 05/01 0700 In: 4736.2 [I.V.:3806.2; NG/GT:180; IV Piggyback:750] Out: 2735 [Urine:2735] Intake/Output from this shift: Total I/O In: 203.1 [I.V.:153.1; IV Piggyback:50] Out: -   Labs:  Basename 11/15/11 0700 11/15/11 0515 11/15/11 0250 11/14/11 1827  WBC -- -- -- 28.8*  HGB -- -- -- 11.0*  PLT -- -- -- 302  LABCREA -- -- -- --  CREATININE 1.70* 1.79* 1.98* --   Estimated Creatinine Clearance: 32.4 ml/min (by C-G formula based on Cr of 1.7). No results found for this basename: VANCOTROUGH:2,VANCOPEAK:2,VANCORANDOM:2,GENTTROUGH:2,GENTPEAK:2,GENTRANDOM:2,TOBRATROUGH:2,TOBRAPEAK:2,TOBRARND:2,AMIKACINPEAK:2,AMIKACINTROU:2,AMIKACIN:2, in the last 72 hours   Microbiology: Recent Results (from the past 720 hour(s))  CULTURE, BLOOD (ROUTINE X 2)     Status: Normal (Preliminary result)   Collection Time   11/14/11  5:15 PM      Component Value Range Status Comment   Specimen Description BLOOD CENTRAL LINE   Final    Special Requests BOTTLES DRAWN AEROBIC ONLY 5CC   Final    Culture  Setup Time [id number]   Final    Culture     Final    Value:        BLOOD CULTURE RECEIVED NO GROWTH TO DATE CULTURE WILL BE HELD FOR 5 DAYS BEFORE ISSUING A FINAL NEGATIVE REPORT   Report Status PENDING   Incomplete   MRSA PCR SCREENING     Status: Normal   Collection Time   11/14/11  7:32 PM      Component Value Range Status Comment   MRSA by PCR NEGATIVE  NEGATIVE  Final   CULTURE, RESPIRATORY     Status: Normal (Preliminary result)   Collection Time   11/14/11  9:10 PM   Component Value Range Status Comment   Specimen Description ENDOTRACHEAL   Final    Special Requests NONE   Final    Gram Stain     Final    Value: RARE WBC PRESENT,BOTH PMN AND MONONUCLEAR     FEW SQUAMOUS EPITHELIAL CELLS PRESENT     MODERATE GRAM POSITIVE COCCI IN PAIRS AND CHAINS     IN CLUSTERS RARE GRAM POSITIVE RODS   Culture NO GROWTH   Final    Report Status PENDING   Incomplete     Anti-infectives     Start     Dose/Rate Route Frequency Ordered Stop   11/15/11 2200   vancomycin (VANCOCIN) 750 mg in sodium chloride 0.9 % 150 mL IVPB        750 mg 150 mL/hr over 60 Minutes Intravenous Every 24 hours 11/14/11 2043     11/15/11 1200  piperacillin-tazobactam (ZOSYN) IVPB 3.375 g       3.375 g 12.5 mL/hr over 240 Minutes Intravenous 4 times per day 11/15/11 0911     11/15/11 0600   piperacillin-tazobactam (ZOSYN) IVPB 2.25 g  Status:  Discontinued        2.25 g 100 mL/hr over 30 Minutes Intravenous 4 times per day 11/15/11 0149 11/15/11 0911   11/15/11 0115   imipenem-cilastatin (PRIMAXIN) 500 mg in sodium chloride 0.9 % 100 mL IVPB  Status:  Discontinued  500 mg 200 mL/hr over 30 Minutes Intravenous  Once 11/15/11 0109 11/15/11 0141   11/14/11 2200   piperacillin-tazobactam (ZOSYN) IVPB 3.375 g  Status:  Discontinued        3.375 g 12.5 mL/hr over 240 Minutes Intravenous 3 times per day 11/14/11 2043 11/15/11 0132   11/14/11 2100   vancomycin (VANCOCIN) IVPB 1000 mg/200 mL premix        1,000 mg 200 mL/hr over 60 Minutes Intravenous NOW 11/14/11 2043 11/14/11 2200          Assessment: 46 YOF with Type 1 DM, hx of EtOH abuse and noncompliance from [facility] admitted for AMS and DKA.   Infectious Disease: Empiric Vanc/Zosyn d#1 for r/o sepsis (hx of latent TB), wbc 28.8, Tmax 100.3, ARF improving Abx: Vanc 4/30 >> Zosyn 4/30 >> Cx: 4/30 Blood Cx- pending 4/30 Resp Cx- pending 4/30 Urine Cx- pending MRSA PCR negative  *Zosyn dose was changed to 2.25  q6h to allow for lines for RN. Renal function much improved and qualifies for increased dosing.   Nephrology: ARF in setting of DKA, SCr trending down (1.70)/estCrCl~32.67ml/min, K improved (repleted this am); Acidosis improving (AG 19); IVF- D5W 100cc/hr for free water deficit~5L; Na 160-initiating free water. F/u Na.  Goal of Therapy:  Clinical Improvement  Plan:  1. Increase Zosyn 3.375g IV q8h-4hr infusion for improved renal function.   Link Snuffer, PharmD, BCPS Clinical Pharmacist [phone] 11/15/2011,9:11 AM

## 2011-11-16 ENCOUNTER — Inpatient Hospital Stay (HOSPITAL_COMMUNITY): Payer: Medicaid Other

## 2011-11-16 LAB — POCT I-STAT 3, ART BLOOD GAS (G3+)
Acid-base deficit: 7 mmol/L — ABNORMAL HIGH (ref 0.0–2.0)
Patient temperature: 101.2
TCO2: 20 mmol/L (ref 0–100)
pH, Arterial: 7.272 — ABNORMAL LOW (ref 7.350–7.400)

## 2011-11-16 LAB — HEPATIC FUNCTION PANEL
Alkaline Phosphatase: 90 U/L (ref 39–117)
Total Protein: 6 g/dL (ref 6.0–8.3)

## 2011-11-16 LAB — BASIC METABOLIC PANEL
BUN: 19 mg/dL (ref 6–23)
CO2: 22 mEq/L (ref 19–32)
Chloride: 120 mEq/L — ABNORMAL HIGH (ref 96–112)
Chloride: 115 mEq/L — ABNORMAL HIGH (ref 96–112)
GFR calc Af Amer: 48 mL/min — ABNORMAL LOW (ref >90)
GFR calc non Af Amer: 37 mL/min — ABNORMAL LOW (ref >90)
Glucose, Bld: 214 mg/dL — ABNORMAL HIGH (ref 70–99)
Potassium: 4.1 mEq/L (ref 3.5–5.1)
Potassium: 3.9 mEq/L (ref 3.5–5.1)
Sodium: 149 mEq/L — ABNORMAL HIGH (ref 135–145)

## 2011-11-16 LAB — GLUCOSE, CAPILLARY
Glucose-Capillary: 162 mg/dL — ABNORMAL HIGH (ref 70–99)
Glucose-Capillary: 298 mg/dL — ABNORMAL HIGH (ref 70–99)
Glucose-Capillary: 108 mg/dL — ABNORMAL HIGH (ref 70–99)
Glucose-Capillary: 185 mg/dL — ABNORMAL HIGH (ref 70–99)
Glucose-Capillary: 188 mg/dL — ABNORMAL HIGH (ref 70–99)
Glucose-Capillary: 177 mg/dL — ABNORMAL HIGH (ref 70–99)
Glucose-Capillary: 144 mg/dL — ABNORMAL HIGH (ref 70–99)
Glucose-Capillary: 185 mg/dL — ABNORMAL HIGH (ref 70–99)
Glucose-Capillary: 149 mg/dL — ABNORMAL HIGH (ref 70–99)
Glucose-Capillary: 102 mg/dL — ABNORMAL HIGH (ref 70–99)

## 2011-11-16 LAB — CBC
MCV: 63 fL — ABNORMAL LOW (ref 78.0–100.0)
Platelets: 168 10*3/uL (ref 150–400)
RDW: 17 % — ABNORMAL HIGH (ref 11.5–15.5)
WBC: 12.4 10*3/uL — ABNORMAL HIGH (ref 4.0–10.5)

## 2011-11-16 LAB — PHOSPHORUS: Phosphorus: 1 mg/dL — CL (ref 2.3–4.6)

## 2011-11-16 LAB — MAGNESIUM: Magnesium: 1.4 mg/dL — ABNORMAL LOW (ref 1.5–2.5)

## 2011-11-16 MED ORDER — LACTULOSE 10 GM/15ML PO SOLN
20.0000 g | Freq: Every day | ORAL | Status: DC
  Administered 2011-11-16 – 2011-11-19 (×4): 20 g
  Filled 2011-11-16 (×5): qty 30

## 2011-11-16 MED ORDER — LORAZEPAM 2 MG/ML IJ SOLN
2.0000 mg | INTRAMUSCULAR | Status: DC | PRN
  Administered 2011-11-17 (×3): 2 mg via INTRAVENOUS
  Filled 2011-11-16 (×4): qty 1

## 2011-11-16 MED ORDER — SODIUM CHLORIDE 0.9 % IV SOLN
500.0000 mg | Freq: Two times a day (BID) | INTRAVENOUS | Status: DC
  Administered 2011-11-16 – 2011-11-17 (×3): 500 mg via INTRAVENOUS
  Filled 2011-11-16 (×4): qty 5

## 2011-11-16 MED ORDER — SODIUM CHLORIDE 0.9 % IV SOLN
INTRAVENOUS | Status: DC
  Administered 2011-11-16: 2.7 [IU]/h via INTRAVENOUS
  Administered 2011-11-16: 10.3 [IU]/h via INTRAVENOUS
  Administered 2011-11-16: 3.2 [IU]/h via INTRAVENOUS
  Administered 2011-11-16: 6.8 [IU]/h via INTRAVENOUS
  Administered 2011-11-17: 1.7 [IU]/h via INTRAVENOUS
  Administered 2011-11-17: 3 [IU]/h via INTRAVENOUS
  Administered 2011-11-17: 0.8 [IU]/h via INTRAVENOUS
  Administered 2011-11-18: 6.6 [IU]/h via INTRAVENOUS
  Administered 2011-11-18: 5.3 [IU]/h via INTRAVENOUS
  Administered 2011-11-18: 6.7 [IU]/h via INTRAVENOUS
  Filled 2011-11-16 (×3): qty 1

## 2011-11-16 MED ORDER — PIPERACILLIN-TAZOBACTAM 3.375 G IVPB
3.3750 g | Freq: Three times a day (TID) | INTRAVENOUS | Status: DC
  Administered 2011-11-16 – 2011-11-18 (×6): 3.375 g via INTRAVENOUS
  Filled 2011-11-16 (×9): qty 50

## 2011-11-16 MED ORDER — IPRATROPIUM-ALBUTEROL 18-103 MCG/ACT IN AERO: 4.0000 | INHALATION_SPRAY | RESPIRATORY_TRACT | Status: DC | PRN

## 2011-11-16 MED ORDER — POTASSIUM PHOSPHATE DIBASIC 3 MMOLE/ML IV SOLN
30.0000 mmol | Freq: Once | INTRAVENOUS | Status: AC
  Administered 2011-11-16: 30 mmol via INTRAVENOUS
  Filled 2011-11-16: qty 10

## 2011-11-16 MED ORDER — MAGNESIUM SULFATE 50 % IJ SOLN
2.0000 g | Freq: Once | INTRAVENOUS | Status: AC
  Administered 2011-11-16: 2 g via INTRAVENOUS
  Filled 2011-11-16: qty 4

## 2011-11-16 MED ORDER — ADULT MULTIVITAMIN LIQUID CH
5.0000 mL | Freq: Every day | ORAL | Status: DC
  Administered 2011-11-16 – 2011-11-21 (×6): 5 mL via ORAL
  Filled 2011-11-16 (×9): qty 5

## 2011-11-16 MED FILL — Sodium Bicarbonate IV Soln 8.4%: INTRAVENOUS | Qty: 100 | Status: AC

## 2011-11-16 MED FILL — Calcium Chloride Inj 10%: INTRAVENOUS | Qty: 10 | Status: AC

## 2011-11-16 NOTE — Significant Event (Signed)
Pt noted to have seizure this morning.  She had Lt sided facial twitching and Rt arm tonic/clonic movement.  Lasted for about 1 minute.  Given versed IV.  Will start keppra, use prn ativan, and check EEG.  Will ask neuro to evaluate also, and then decide if she needs repeat CT head or MRI head.  Coralyn Helling, MD 11/16/2011, 9:54 AM Pager:  [phone]

## 2011-11-16 NOTE — Significant Event (Signed)
Appreciate help from neuro.  Will ask IR to arrange for LP under fluoro.  Will hold heparin SQ, and place SCD's.  Will hold tube feeds until after procedure.  Will ask to have fluid sent for protein, glucose, cell count with differential, gram stain and culture, and RPR.  Coralyn Helling, MD 11/16/2011, 3:21 PM Pager:  [phone]

## 2011-11-16 NOTE — Progress Notes (Signed)
CRITICAL VALUE ALERT  Critical value received:  Phosphorus=1.0  Date of notification:  11/16/11  Time of notification:  05:41  Critical value read back:yes  Nurse who received alert:  J. Nancy Nordmann BSN  MD notified (1st page):  Dr. Sherene Sires  Time of first page:  05:43  MD notified (2nd page):  Time of second page:  Responding MD:  Dr. Sherene Sires  Time MD responded:  05:43

## 2011-11-16 NOTE — Consult Note (Signed)
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    pO2, Arterial 144.0 (*) 80.0 - 100.0 (mmHg)    Bicarbonate 19.2 (*) 20.0 - 24.0 (mEq/L)    TCO2 20  0 - 100 (mmol/L)    O2 Saturation 99.0      Acid-base deficit 7.0 (*) 0.0 - 2.0 (mmol/L)    Patient temperature 101.2 F      Collection site RADIAL, ALLEN'S TEST ACCEPTABLE      Drawn by Nurse      Sample type ARTERIAL     GLUCOSE, CAPILLARY     Status: Abnormal   Collection Time   11/16/11  4:20 AM      Component Value Range Comment   Glucose-Capillary 225 (*) 70 - 99 (mg/dL)   CBC     Status: Abnormal   Collection Time   11/16/11  4:40 AM      Component Value Range Comment   WBC 12.4 (*) 4.0 - 10.5 (K/uL)    RBC 4.65  3.87 - 5.11 (MIL/uL)    Hemoglobin 9.5 (*) 12.0 - 15.0 (g/dL)    HCT 81.1 (*) 91.4 - 46.0 (%)    MCV 63.0 (*) 78.0 - 100.0 (fL)    MCH 20.4  (*) 26.0 - 34.0 (pg)    MCHC 32.4  30.0 -  36.0 (g/dL)    RDW 16.1 (*) 09.6 - 15.5 (%)    Platelets 168  150 - 400 (K/uL)   MAGNESIUM     Status: Normal   Collection Time   11/16/11  4:40 AM      Component Value Range Comment   Magnesium 1.6  1.5 - 2.5 (mg/dL)   PHOSPHORUS     Status: Abnormal   Collection Time   11/16/11  4:40 AM      Component Value Range Comment   Phosphorus 1.0 (*) 2.3 - 4.6 (mg/dL)   HEPATIC FUNCTION PANEL     Status: Abnormal   Collection Time   11/16/11  4:40 AM      Component Value Range Comment   Total Protein 6.0  6.0 - 8.3 (g/dL)    Albumin 2.5 (*) 3.5 - 5.2 (g/dL)    AST 045 (*) 0 - 37 (U/L) HEMOLYSIS AT THIS LEVEL MAY AFFECT RESULT   ALT 114 (*) 0 - 35 (U/L)    Alkaline Phosphatase 90  39 - 117 (U/L)    Total Bilirubin 0.2 (*) 0.3 - 1.2 (mg/dL)    Bilirubin, Direct <4.0  0.0 - 0.3 (mg/dL)    Indirect Bilirubin NOT CALCULATED  0.3 - 0.9 (mg/dL)   GLUCOSE, CAPILLARY     Status: Abnormal   Collection Time   11/16/11  5:25 AM      Component Value Range Comment   Glucose-Capillary 177 (*) 70 - 99 (mg/dL)   GLUCOSE, CAPILLARY     Status: Abnormal   Collection Time   11/16/11  6:29 AM      Component Value Range Comment   Glucose-Capillary 108 (*) 70 - 99 (mg/dL)   GLUCOSE, CAPILLARY     Status: Abnormal   Collection Time   11/16/11  7:39 AM      Component Value Range Comment   Glucose-Capillary 127 (*) 70 - 99 (mg/dL)   GLUCOSE, CAPILLARY     Status: Abnormal   Collection Time   11/16/11  8:46 AM      Component Value Range Comment   Glucose-Capillary 47 (*) 70 - 99 (mg/dL)   GLUCOSE, CAPILLARY     Status: Abnormal   Collection Time   11/16/11  9:09 AM      Component Value Range Comment   Glucose-Capillary 185 (*) 70 - 99 (mg/dL)   BASIC METABOLIC PANEL     Status: Abnormal   Collection Time   11/16/11  9:23 AM      Component Value Range Comment   Sodium 149 (*) 135 - 145 (mEq/L)    Potassium 4.1  3.5 - 5.1 (mEq/L)    Chloride 120 (*) 96 - 112 (mEq/L)     CO2 20  19 - 32 (mEq/L)    Glucose, Bld 214 (*) 70 - 99 (mg/dL)    BUN 23  6 - 23 (mg/dL)    Creatinine, Ser 9.81 (*) 0.50 - 1.10 (mg/dL)    Calcium 7.1 (*) 8.4 - 10.5 (mg/dL)    GFR calc non Af Amer 37 (*) >90 (mL/min)    GFR calc Af Amer 43 (*) >90 (mL/min)   GLUCOSE, CAPILLARY     Status: Abnormal   Collection Time   11/16/11  9:58 AM      Component Value Range Comment   Glucose-Capillary 188 (*) 70 - 99 (mg/dL)   GLUCOSE, CAPILLARY     Status: Abnormal   Collection Time   11/16/11 11:08 AM  Component Value Range Comment   Glucose-Capillary 198 (*) 70 - 99 (mg/dL)     Ct Head Wo Contrast  11/14/2011  *RADIOLOGY REPORT*  Clinical Data: Hyperglycemia, altered mental status  CT HEAD WITHOUT CONTRAST  Technique:  Contiguous axial images were obtained from the base of the skull through the vertex without contrast.  Comparison: 03/28/2010; 03/20/2010  Findings:  Normal gray-white differentiation.  No CT evidence of acute large territory infarct.  Normal size and configuration of the ventricles and basilar cisterns.  No midline shift.  No intraparenchymal or extra-axial mass or hemorrhage.  Paranasal sinuses and mastoid air cells are normal. Post bilateral cataract surgery.  IMPRESSION: Negative noncontrast head CT.  Original Report Authenticated By: Waynard Reeds, M.D.   Dg Chest Port 1 View  11/16/2011  *RADIOLOGY REPORT*  Clinical Data: Respiratory failure  PORTABLE CHEST - 1 VIEW  Comparison: 11/14/2011  Findings: Endotracheal tube at the thoracic inlet.  Stable right IJ venous catheter and enteric tube coursing below the diaphragm.  Lungs are essentially clear.  No pleural effusion or pneumothorax.  Cardiomediastinal silhouette is within normal limits.  IMPRESSION: Endotracheal tube at the thoracic inlet.  Stable support apparatus.  Original Report Authenticated By: Charline Bills, M.D.   Dg Chest Port 1 View  11/14/2011  *RADIOLOGY REPORT*  Clinical Data: 46 year old female intubated,  tube adjustment.  PORTABLE CHEST - 1 VIEW  Comparison: 2015 hours the same day and earlier.  Findings: AP portable semi upright view 2025 hours. Endotracheal tube tip in good position between the level of the clavicles and carina.  Enteric tube courses to the abdomen, tip not included.  Stable right IJ central line tip, likely at the right atrium level. No pneumothorax.  Stable lung volumes.  No pulmonary edema, pleural effusion or confluent opacity.  IMPRESSION: 1.  Endotracheal tube tip in good position.  Other lines and tubes are stable. 2. No acute cardiopulmonary abnormality.  Original Report Authenticated By: Harley Hallmark, M.D.   Dg Chest Port 1 View  11/14/2011  *RADIOLOGY REPORT*  Clinical Data: Intubation  PORTABLE CHEST - 1 VIEW  Comparison: Portable exam 2015 hours compared to 1720 hours  Findings: Tip of endotracheal tube within right mainstem bronchus, recommend withdrawal approximately 4.5 to 5.0 cm for positioning 2 cm above carina. Nasogastric tube within stomach. Right jugular central venous catheter, tip projecting over SVC. Normal heart size, mediastinal contours, pulmonary vascularity. Lungs clear. Pleural effusion or pneumothorax.  IMPRESSION: Right mainstem bronchus intubation, recommend withdrawal 4.5-5.0 cm.  Findings called to Sharen Hones in ED at 2026 hours on 11/14/2011.  Original Report Authenticated By: Lollie Marrow, M.D.   Dg Chest Portable 1 View  11/14/2011  *RADIOLOGY REPORT*  Clinical Data: Hyperglycemia and altered mental status  PORTABLE CHEST - 1 VIEW  Comparison: 02/07/2010  Findings: Normal heart size.  Clear lungs.  IMPRESSION: Negative.  Original Report Authenticated By: Donavan Burnet, M.D.   Dg Chest Port 1v Same Day  11/14/2011  *RADIOLOGY REPORT*  Clinical Data: 46 year old female with central line placement.  PORTABLE CHEST - 1 VIEW SAME DAY  Comparison: 1640 hours the same day and earlier.  Findings: Portable spine AP view 1720 hours.  Right IJ central line  placed.  Tip projects about 6 cm below the level of the carina.  No pneumothorax.  Stable lung volumes.  The lungs remain clear. Stable cardiac size and mediastinal contours.  IMPRESSION: Right IJ central line tip projects at the level of the  right atrium.  This could be retracted 1-2 cm to allow for cavoatrial junction or lower SVC level placement. No pneumothorax.  Original Report Authenticated By: Harley Hallmark, M.D.     Assessment/Plan:   46 YO female history of syphilis, drug abuse and medical noncompliance brought in [facility] ED with altered mental status and found to be hyperglycemic, cocaine (+), with multiple metabolic derangements complicated by respiratory failure and shock. Patient noted to have left eye facial twitching and right arm jerking for one minute today.  Keppra 500 mg IV BID started. EEG has been done but reading pending. Given multiple medical abnormalities certainly cannot ule out seizure. No source for elevated temperature found at this time. Must also consider serotonin syndrome, however unlikely.  Recommend: 1) LP for cell/diff, gram stain and culture, RPR, protein and glucose 2) agree with EEG and Keppra 3) MRI with and without dye after LP to look for intracranial abnormalities  I have discussed patient with Dr. Bettey Costa and she has seen the patient and agrees with the above mentioned.    Nicole Morn PA-C [facility] [phone]  11/16/2011, 2:03 PM

## 2011-11-16 NOTE — Progress Notes (Signed)
Portable EEG completed

## 2011-11-16 NOTE — ED Provider Notes (Signed)
I saw and evaluated the patient, reviewed the resident's note and I agree with the findings and plan.  Dayonna Selbe, MD 11/16/11 0704 

## 2011-11-16 NOTE — Progress Notes (Signed)
Unable to perform WUA assessment at this time. Patient is agitated with current level of sedation, and unable to calm with voice or touch.  HR exceeding 150 bpm and BP greater than 160 sys mmHg. Appears to be reaching for ETT and unable to calm. Sedation increased at this time and stayed with patient until she calmed.

## 2011-11-16 NOTE — Procedures (Signed)
This routine EEG was requested in this 46 year old female who was admitted with diabetic ketoacidosis and who has type 1 diabetes.  She has altered mental status.  Medications include levetiracetam.  The EEG was done with the patient unresponsive and on ventilator. Background activities were composed of mainly low amplitude poorly organized delta activities.  Superimposed on this at times were higher frequency theta activities.  Painful stimulation did not markedly change the tracing.  There did seem to be, however, some spontaneous reactivity.  Photic stimulation produced a symmetric driving response.  The patient could not be hyperventilated.  CLINICAL INTERPRETATION:  This routine EEG done with the patient unresponsive is abnormal.  The spontaneously reactive delta and theta activities suggest a moderate encephalopathy of nonspecific etiology.          ______________________________ Denton Meek, MD    [id number] D:  11/16/2011 18:10:26  T:  11/16/2011 18:36:29  Job #:  [id number]

## 2011-11-16 NOTE — Progress Notes (Signed)
eLink Physician-Brief Progress Note Patient Name: [name] DOB: 03-28-1966 MRN: [id number]  Date of Service  11/16/2011   HPI/Events of Note   Low mag , low output  eICU Interventions  Low replacement   Intervention Category Major Interventions: Electrolyte abnormality - evaluation and management  Vung Kush J. 11/16/2011, 8:56 PM

## 2011-11-16 NOTE — Progress Notes (Signed)
[name] is a 46 y.o. female smoker admitted on 11/14/2011 with metabolic encephalopathy 2nd to hyperglycemia/hypernatremia/acidosis in setting of DM type I with non-compliance complicated by respiratory failure and shock.   Hx of Hypothyroidism, Bipolar, organic brain syndrome, Hyperlipidemia, CAD, Cocaine/ETOH abuse, HTN, Syphilis    Line/tube: Rt IJ CVL 4/30>> ETT 4/30>> Lt radial Aline 4/30>>5/2  Cultures: MRSA screen 4/30>>negative Urine 4/30>> Blood 4/30>> Sputum 4/30>>  Antibiotics: Vancomycin 4/30>> Zosyn 4/30>>  Tests/events: 4/30 CT head>>negative  SUBJECTIVE: Off pressors.  Tolerating pressure support.  Agitated with WUA.  Febrile overnight.  OBJECTIVE:  Blood pressure 127/64, pulse 126, temperature 101.3 F (38.5 C), temperature source Core (Comment), resp. rate 27, height 5\' 2"  (1.575 m), weight 145 lb 15.1 oz (66.2 kg), last menstrual period 11/01/2011, SpO2 99.00%. Wt Readings from Last 3 Encounters:  11/16/11 145 lb 15.1 oz (66.2 kg)  07/25/11 137 lb 4.8 oz (62.279 kg)  04/04/11 138 lb 3.2 oz (62.687 kg)   Body mass index is 26.69 kg/(m^2).  I/O last 3 completed shifts: In: 7489.9 [I.V.:5707.4; NG/GT:670; IV Piggyback:1112.5] Out: 3480 [Urine:3480]  Vent Mode:  [-] PRVC FiO2 (%):  [30 %] 30 % Set Rate:  [14 bmp] 14 bmp Vt Set:  [400 mL] 400 mL PEEP:  [5 cmH20] 5 cmH20 Plateau Pressure:  [19 cmH20-22 cmH20] 19 cmH20  Physical Exam: General - no distress HEENT - pupils anisocoria, ETT in place Cardiac - s1s2 tachycardic/regular, no murmur Chest - no wheeze Abd - soft, mild tenderness, decreased bowel sounds Ext - no edema Neuro - sedated  CBC    Component Value Date/Time   WBC 12.4* 11/16/2011 0440   RBC 4.65 11/16/2011 0440   HGB 9.5* 11/16/2011 0440   HCT 29.3* 11/16/2011 0440   PLT 168 11/16/2011 0440   MCV 63.0* 11/16/2011 0440   MCH 20.4* 11/16/2011 0440   MCHC 32.4 11/16/2011 0440   RDW 17.0* 11/16/2011 0440   LYMPHSABS 1.4 11/14/2011 1827   MONOABS 2.0* 11/14/2011 1827   EOSABS 0.0 11/14/2011 1827   BASOSABS 0.0 11/14/2011 1827    BMET    Component Value Date/Time   NA 156* 11/15/2011 1600   K 4.7 11/15/2011 1600   CL 130* 11/15/2011 1600   CO2 20 11/15/2011 1600   GLUCOSE 118* 11/15/2011 1600   BUN 32* 11/15/2011 1600   CREATININE 1.81* 11/15/2011 1600   CALCIUM 7.6* 11/15/2011 1600   GFRNONAA 32* 11/15/2011 1600   GFRAA 37* 11/15/2011 1600    Lab Results  Component Value Date   ALT 114* 11/16/2011   AST 320* 11/16/2011   ALKPHOS 90 11/16/2011   BILITOT 0.2* 11/16/2011   ABG    Component Value Date/Time   PHART 7.272* 11/16/2011 0356   PCO2ART 42.2 11/16/2011 0356   PO2ART 144.0* 11/16/2011 0356   HCO3 19.2* 11/16/2011 0356   TCO2 20 11/16/2011 0356   ACIDBASEDEF 7.0* 11/16/2011 0356   O2SAT 99.0 11/16/2011 0356    Ct Head Wo Contrast  11/14/2011  *RADIOLOGY REPORT*  Clinical Data: Hyperglycemia, altered mental status  CT HEAD WITHOUT CONTRAST  Technique:  Contiguous axial images were obtained from the base of the skull through the vertex without contrast.  Comparison: 03/28/2010; 03/20/2010  Findings:  Normal gray-white differentiation.  No CT evidence of acute large territory infarct.  Normal size and configuration of the ventricles and basilar cisterns.  No midline shift.  No intraparenchymal or extra-axial mass or hemorrhage.  Paranasal sinuses and mastoid air cells are normal. Post bilateral  cataract surgery.  IMPRESSION: Negative noncontrast head CT.  Original Report Authenticated By: Waynard Reeds, M.D.   Dg Chest Port 1 View  11/14/2011  *RADIOLOGY REPORT*  Clinical Data: 46 year old female intubated, tube adjustment.  PORTABLE CHEST - 1 VIEW  Comparison: 2015 hours the same day and earlier.  Findings: AP portable semi upright view 2025 hours. Endotracheal tube tip in good position between the level of the clavicles and carina.  Enteric tube courses to the abdomen, tip not included.  Stable right IJ central line tip, likely at the right atrium  level. No pneumothorax.  Stable lung volumes.  No pulmonary edema, pleural effusion or confluent opacity.  IMPRESSION: 1.  Endotracheal tube tip in good position.  Other lines and tubes are stable. 2. No acute cardiopulmonary abnormality.  Original Report Authenticated By: Harley Hallmark, M.D.   Dg Chest Port 1 View  11/14/2011  *RADIOLOGY REPORT*  Clinical Data: Intubation  PORTABLE CHEST - 1 VIEW  Comparison: Portable exam 2015 hours compared to 1720 hours  Findings: Tip of endotracheal tube within right mainstem bronchus, recommend withdrawal approximately 4.5 to 5.0 cm for positioning 2 cm above carina. Nasogastric tube within stomach. Right jugular central venous catheter, tip projecting over SVC. Normal heart size, mediastinal contours, pulmonary vascularity. Lungs clear. Pleural effusion or pneumothorax.  IMPRESSION: Right mainstem bronchus intubation, recommend withdrawal 4.5-5.0 cm.  Findings called to Sharen Hones in ED at 2026 hours on 11/14/2011.  Original Report Authenticated By: Lollie Marrow, M.D.   Dg Chest Portable 1 View  11/14/2011  *RADIOLOGY REPORT*  Clinical Data: Hyperglycemia and altered mental status  PORTABLE CHEST - 1 VIEW  Comparison: 02/07/2010  Findings: Normal heart size.  Clear lungs.  IMPRESSION: Negative.  Original Report Authenticated By: Donavan Burnet, M.D.   Dg Chest Port 1v Same Day  11/14/2011  *RADIOLOGY REPORT*  Clinical Data: 46 year old female with central line placement.  PORTABLE CHEST - 1 VIEW SAME DAY  Comparison: 1640 hours the same day and earlier.  Findings: Portable spine AP view 1720 hours.  Right IJ central line placed.  Tip projects about 6 cm below the level of the carina.  No pneumothorax.  Stable lung volumes.  The lungs remain clear. Stable cardiac size and mediastinal contours.  IMPRESSION: Right IJ central line tip projects at the level of the right atrium.  This could be retracted 1-2 cm to allow for cavoatrial junction or lower SVC level  placement. No pneumothorax.  Original Report Authenticated By: Harley Hallmark, M.D.    ASSESSMENT/PLAN:  Hyperosmolar, DKA with hx of type 1 DM  -insulin gtt until anion gap corrects -continue dextrose in IV fluid -monitor electrolytes closely  Acute respiratory failure 2nd to altered mental status, acidosis.  Hx of smoking. -adjust RR to 14, and Vt to 8 cc/kg (400) -pressure support wean as tolerated>>not ready for extubation yet -f/u CXR and ABG -prn bronchodilator therapy  Shock 2nd to hypovolemia, ?sepsis, and probable PEEPi -vent settings adjusted to avoid PEEPi -weaned off pressors 5/1 -d/c arterial line 5/2 -decrease IV fluids to 50 ml/hr -D3/x vancomycin, zosyn>>narrow abx once culture results finalized  Acute renal failure 2nd to hypovolemia, and shock.  Baseline creatinine 1.1 from January 2012. -continue volume resuscitation -monitor renal fx and urine outpt  Hypophosphatemia, hypomagnesemia -replace and f/u electrolytes  Anion gap metabolic acidosis from DKA, and lactic acidosis  -lactic acid level improved with volume resuscitation -f/u ABG, BMET, lactic acid  Abdominal pain>>improved 5/2 -monitor  clinically  Hypernatremia -continue free water tube flushes -continue D5W IV fluid -f/u BMET q8hrs for now  Acute metabolic encephalopathy 2nd to hyperglycemia, acidosis, hypotension, elevated ammonia.  Hx of substance abuse, bipolar, organic brain syndrome.  UDS positive cocaine. -sedation protocol while on vent>>goal RASS -1 -hold depakote, risperdal for now -added lactulose per tube 5/1>>ammonia level improved>>change lactulose to qdaily 5/2  Elevated LFT's with hx of ETOH -f/u LFT's intermittently  Protein calorie malnutrition -tolerating tube feeds -add multivitamin  Hx of CAD, hyperlipidemia -LFT's elevated>>hold simvastatin -continue ASA 81 mg qdaily  Reported hx of hypothyroidism>>TSH 5.32 from 4/30 -will need repeat TSH when more stable and  then decide if thyroid replacement therapy is needed  Best practice -Resume xalatan 0.005% one gtt each eye qhs -protonix for SUP -heparin SQ for DVT proph  Critical care time 35 minutes  Khrystian Schauf Pager:  [phone] 11/16/2011, 9:21 AM

## 2011-11-17 ENCOUNTER — Inpatient Hospital Stay (HOSPITAL_COMMUNITY): Payer: Medicaid Other

## 2011-11-17 LAB — GRAM STAIN

## 2011-11-17 LAB — CSF CELL COUNT WITH DIFFERENTIAL: WBC, CSF: 2 /mm3 (ref 0–5)

## 2011-11-17 LAB — BASIC METABOLIC PANEL
BUN: 16 mg/dL (ref 6–23)
CO2: 23 mEq/L (ref 19–32)
Chloride: 112 mEq/L (ref 96–112)
Glucose, Bld: 170 mg/dL — ABNORMAL HIGH (ref 70–99)
Potassium: 3.1 mEq/L — ABNORMAL LOW (ref 3.5–5.1)

## 2011-11-17 LAB — GLUCOSE, CAPILLARY
Glucose-Capillary: 242 mg/dL — ABNORMAL HIGH (ref 70–99)
Glucose-Capillary: 233 mg/dL — ABNORMAL HIGH (ref 70–99)
Glucose-Capillary: 200 mg/dL — ABNORMAL HIGH (ref 70–99)
Glucose-Capillary: 92 mg/dL (ref 70–99)
Glucose-Capillary: 145 mg/dL — ABNORMAL HIGH (ref 70–99)
Glucose-Capillary: 127 mg/dL — ABNORMAL HIGH (ref 70–99)

## 2011-11-17 LAB — URINE CULTURE
Colony Count: 4000
Culture  Setup Time: 201304301903

## 2011-11-17 LAB — PROTEIN AND GLUCOSE, CSF: Glucose, CSF: 101 mg/dL — ABNORMAL HIGH (ref 43–76)

## 2011-11-17 LAB — POCT I-STAT 3, ART BLOOD GAS (G3+)
Bicarbonate: 21.2 mEq/L (ref 20.0–24.0)
Patient temperature: 99.3
TCO2: 23 mmol/L (ref 0–100)
pH, Arterial: 7.294 — ABNORMAL LOW (ref 7.350–7.400)

## 2011-11-17 LAB — CBC
HCT: 25.1 % — ABNORMAL LOW (ref 36.0–46.0)
Hemoglobin: 8.1 g/dL — ABNORMAL LOW (ref 12.0–15.0)
MCH: 20.1 pg — ABNORMAL LOW (ref 26.0–34.0)
MCHC: 32.3 g/dL (ref 30.0–36.0)

## 2011-11-17 MED ORDER — SODIUM CHLORIDE 0.9 % IV SOLN
750.0000 mg | Freq: Two times a day (BID) | INTRAVENOUS | Status: DC
  Filled 2011-11-17 (×2): qty 750

## 2011-11-17 MED ORDER — POTASSIUM CHLORIDE 20 MEQ/15ML (10%) PO LIQD
40.0000 meq | Freq: Once | ORAL | Status: DC
  Filled 2011-11-17: qty 30

## 2011-11-17 MED ORDER — MIDAZOLAM HCL 2 MG/2ML IJ SOLN
INTRAMUSCULAR | Status: AC
  Filled 2011-11-17: qty 2

## 2011-11-17 NOTE — Progress Notes (Signed)
[name] is a 46 y.o. female smoker admitted on 11/14/2011 with metabolic encephalopathy 2nd to hyperglycemia/hypernatremia/acidosis in setting of DM type I with non-compliance complicated by respiratory failure and shock.   Hx of Hypothyroidism, Bipolar, organic brain syndrome, Hyperlipidemia, CAD, Cocaine/ETOH abuse, HTN, Syphilis    Line/tube: Rt IJ CVL 4/30>> ETT 4/30>> Lt radial Aline 4/30>>5/2  Cultures: MRSA screen 4/30>>negative Urine 4/30>> Blood 4/30>> Sputum 4/30>>  Antibiotics: Vancomycin 4/30>> Zosyn 4/30>>  Tests/events: 4/30 CT head>>negative 5/2 Seizure 5/2 EEG>>The spontaneously reactive delta and theta activities suggest a moderate encephalopathy of nonspecific etiology.  SUBJECTIVE: Fever curve better.  No further seizures since 5/2.  Tolerates some pressure support.  OBJECTIVE:  Blood pressure 93/63, pulse 98, temperature 99.7 F (37.6 C), temperature source Core (Comment), resp. rate 14, height 5\' 2"  (1.575 m), weight 152 lb 1.9 oz (69 kg), last menstrual period 11/01/2011, SpO2 100.00%. Wt Readings from Last 3 Encounters:  11/17/11 152 lb 1.9 oz (69 kg)  07/25/11 137 lb 4.8 oz (62.279 kg)  04/04/11 138 lb 3.2 oz (62.687 kg)   Body mass index is 27.82 kg/(m^2).  I/O last 3 completed shifts: In: 5885.8 [I.V.:3698.3; NG/GT:1380; IV Piggyback:807.5] Out: 620 [Urine:620]  Vent Mode:  [-] PRVC FiO2 (%):  [30 %-60 %] 40 % Set Rate:  [14 bmp-23 bmp] 14 bmp Vt Set:  [400 mL] 400 mL PEEP:  [5 cmH20] 5 cmH20 Pressure Support:  [5 cmH20-12 cmH20] 12 cmH20 Plateau Pressure:  [19 cmH20-26 cmH20] 19 cmH20  Physical Exam: General - no distress HEENT - pupils anisocoria, ETT in place Cardiac - s1s2 tachycardic/regular, no murmur Chest - no wheeze Abd - soft, mild tenderness, decreased bowel sounds Ext - no edema Neuro - sedated  CBC    Component Value Date/Time   WBC 10.7* 11/17/2011 0458   RBC 4.03 11/17/2011 0458   HGB 8.1* 11/17/2011 0458   HCT  25.1* 11/17/2011 0458   PLT 127* 11/17/2011 0458   MCV 62.3* 11/17/2011 0458   MCH 20.1* 11/17/2011 0458   MCHC 32.3 11/17/2011 0458   RDW 16.5* 11/17/2011 0458   LYMPHSABS 1.4 11/14/2011 1827   MONOABS 2.0* 11/14/2011 1827   EOSABS 0.0 11/14/2011 1827   BASOSABS 0.0 11/14/2011 1827    BMET    Component Value Date/Time   NA 143 11/17/2011 0458   K 3.1* 11/17/2011 0458   CL 112 11/17/2011 0458   CO2 23 11/17/2011 0458   GLUCOSE 170* 11/17/2011 0458   BUN 16 11/17/2011 0458   CREATININE 1.30* 11/17/2011 0458   CALCIUM 6.5* 11/17/2011 0458   GFRNONAA 48* 11/17/2011 0458   GFRAA 56* 11/17/2011 0458    Lab Results  Component Value Date   ALT 114* 11/16/2011   AST 320* 11/16/2011   ALKPHOS 90 11/16/2011   BILITOT 0.2* 11/16/2011   ABG    Component Value Date/Time   PHART 7.294* 11/17/2011 0331   PCO2ART 43.9 11/17/2011 0331   PO2ART 40.0* 11/17/2011 0331   HCO3 21.2 11/17/2011 0331   TCO2 23 11/17/2011 0331   ACIDBASEDEF 5.0* 11/17/2011 0331   O2SAT 68.0 11/17/2011 0331    Dg Chest Port 1 View  11/17/2011  *RADIOLOGY REPORT*  Clinical Data: Respiratory failure  PORTABLE CHEST - 1 VIEW  Comparison: Chest radiograph 11/16/2011  Findings: Endotracheal tube and right central venous line are unchanged.  There is increased atelectasis at the right lung base. No overt pulmonary edema.  No pleural fluid or pneumothorax.  IMPRESSION:  1.  Stable support  apparatus.  2.   Increased atelectasis at the right lung base.  Original Report Authenticated By: Genevive Bi, M.D.   Dg Chest Port 1 View  11/16/2011  *RADIOLOGY REPORT*  Clinical Data: Respiratory failure  PORTABLE CHEST - 1 VIEW  Comparison: 11/14/2011  Findings: Endotracheal tube at the thoracic inlet.  Stable right IJ venous catheter and enteric tube coursing below the diaphragm.  Lungs are essentially clear.  No pleural effusion or pneumothorax.  Cardiomediastinal silhouette is within normal limits.  IMPRESSION: Endotracheal tube at the thoracic inlet.  Stable support apparatus.   Original Report Authenticated By: Charline Bills, M.D.    ASSESSMENT/PLAN:  Hyperosmolar, DKA with hx of type 1 DM  -transition off insulin gtt per protocol -continue dextrose in IV fluid  Acute respiratory failure 2nd to altered mental status, acidosis.  Hx of smoking. -adjust RR to 14, and Vt to 8 cc/kg (400) -pressure support wean as tolerated>>not ready for extubation yet -f/u CXR and ABG -prn bronchodilator therapy  Shock 2nd to hypovolemia, ?sepsis, and probable PEEPi -vent settings adjusted to avoid PEEPi -weaned off pressors 5/1 -d/c arterial line 5/2 -decrease IV fluids to 50 ml/hr -D4/x vancomycin, zosyn>>narrow abx once culture results finalized  Acute renal failure 2nd to hypovolemia, and shock.  Baseline creatinine 1.1 from January 2012. -continue volume resuscitation -monitor renal fx and urine outpt  Hypophosphatemia, hypomagnesemia, hypokalemia -replace and f/u electrolytes  Anion gap metabolic acidosis from DKA, and lactic acidosis  -lactic acid level improved with volume resuscitation -f/u BMET  Abdominal pain>>improved 5/2 -monitor clinically  Hypernatremia>>resolved -d/c free water tube flushes -continue D5W IV fluid until tube feeds resumed -f/u BMET qdaily  Acute metabolic encephalopathy 2nd to hyperglycemia, acidosis, hypotension, elevated ammonia.  Hx of substance abuse, bipolar, organic brain syndrome.  UDS positive cocaine. -sedation protocol while on vent>>goal RASS -1 -hold depakote, risperdal for now -added lactulose per tube 5/1>>ammonia level improved>>changed lactulose to qdaily 5/2  New Seizure 5/2 -appreciate help from neuro -EEG non-specific -f/u LP>>to be done by IR 5/3 -continue keppra  Elevated LFT's with hx of ETOH -f/u LFT's intermittently  Protein calorie malnutrition -tolerating tube feeds>>on hold for LP -continue multivitamin  Hx of CAD, hyperlipidemia -LFT's elevated>>hold simvastatin -continue ASA 81 mg  qdaily  Reported hx of hypothyroidism>>TSH 5.32 from 4/30 -will need repeat TSH when more stable and then decide if thyroid replacement therapy is needed  Best practice -Resume xalatan 0.005% one gtt each eye qhs -protonix for SUP -heparin SQ for DVT proph>>SCD for now until LP done  Critical care time 35 minutes  Nikkol Pai Pager:  [phone] 11/17/2011, 8:26 AM

## 2011-11-17 NOTE — Progress Notes (Signed)
eLink Physician-Brief Progress Note Patient Name: [name] DOB: [birth year] MRN: [id number]  Date of Service  11/17/2011   HPI/Events of Note  Hypokalemia   eICU Interventions  Replete K    Intervention Category Major Interventions: Electrolyte abnormality - evaluation and management  Shan Levans 11/17/2011, 6:09 AM

## 2011-11-17 NOTE — Progress Notes (Signed)
Clinical Social Worker received referral for "other psychosocial issues".  CSW reviewed chart and staffed case with RN.  PT currently unable to participate in assessment.  CSW phoned pt's mother-Mary [phone]), CSW introduced self and explained role, mother indicated that this time was not convenient to speak.  CSW offered to phone mother back at a later date.  CSW to continue to follow and assist as needed.   Angelia Mould, MSW, [place] [phone]

## 2011-11-17 NOTE — Progress Notes (Signed)
ANTIBIOTIC CONSULT NOTE - FOLLOW UP  Pharmacy Consult for Zosyn/Vancomycin Indication: sepsis - SA in endotracheal   No Known Allergies  Patient Measurements: Height: 5\' 2"  (157.5 cm) Weight: 152 lb 1.9 oz (69 kg) IBW/kg (Calculated) : 50.1  Vital Signs: Temp: 99.2 F (37.3 C) (05/03 2100) BP: 89/64 mmHg (05/03 2100) Pulse Rate: 91  (05/03 2100) Intake/Output from previous day: 05/02 0701 - 05/03 0700 In: 3706.1 [I.V.:2191.1; NG/GT:920; IV Piggyback:595] Out: 75 [Urine:75] Intake/Output from this shift: Total I/O In: 80 [I.V.:60; NG/GT:20] Out: -   Labs:  Basename 11/17/11 0458 11/16/11 1700 11/16/11 0923 11/16/11 0440  WBC 10.7* -- -- 12.4*  HGB 8.1* -- -- 9.5*  PLT 127* -- -- 168  LABCREA -- -- -- --  CREATININE 1.30* 1.47* 1.61* --   Estimated Creatinine Clearance: 48.7 ml/min (by C-G formula based on Cr of 1.3).  Basename 11/17/11 1202  VANCOTROUGH 13.4  VANCOPEAK --  VANCORANDOM --  GENTTROUGH --  GENTPEAK --  GENTRANDOM --  TOBRATROUGH --  TOBRAPEAK --  TOBRARND --  AMIKACINPEAK --  AMIKACINTROU --  AMIKACIN --     Microbiology: Recent Results (from the past 720 hour(s))  CULTURE, BLOOD (ROUTINE X 2)     Status: Normal (Preliminary result)   Collection Time   11/14/11  5:15 PM      Component Value Range Status Comment   Specimen Description BLOOD CENTRAL LINE   Final    Special Requests BOTTLES DRAWN AEROBIC ONLY 5CC   Final    Culture  Setup Time [id number]   Final    Culture     Final    Value:        BLOOD CULTURE RECEIVED NO GROWTH TO DATE CULTURE WILL BE HELD FOR 5 DAYS BEFORE ISSUING A FINAL NEGATIVE REPORT   Report Status PENDING   Incomplete   URINE CULTURE     Status: Normal   Collection Time   11/14/11  5:23 PM      Component Value Range Status Comment   Specimen Description URINE, CATHETERIZED   Final    Special Requests NONE   Final    Culture  Setup Time [id number]   Final    Colony Count 4,000 COLONIES/ML   Final    Culture  ESCHERICHIA COLI   Final    Report Status 11/17/2011 FINAL   Final    Organism ID, Bacteria ESCHERICHIA COLI   Final   CULTURE, BLOOD (ROUTINE X 2)     Status: Normal (Preliminary result)   Collection Time   11/14/11  5:35 PM      Component Value Range Status Comment   Specimen Description BLOOD ARM   Final    Special Requests BOTTLES DRAWN AEROBIC ONLY 10CC   Final    Culture  Setup Time [id number]   Final    Culture     Final    Value:        BLOOD CULTURE RECEIVED NO GROWTH TO DATE CULTURE WILL BE HELD FOR 5 DAYS BEFORE ISSUING A FINAL NEGATIVE REPORT   Report Status PENDING   Incomplete   MRSA PCR SCREENING     Status: Normal   Collection Time   11/14/11  7:32 PM      Component Value Range Status Comment   MRSA by PCR NEGATIVE  NEGATIVE  Final   CULTURE, RESPIRATORY     Status: Normal (Preliminary result)   Collection Time   11/14/11  9:10 PM  Component Value Range Status Comment   Specimen Description ENDOTRACHEAL   Final    Special Requests NONE   Final    Gram Stain     Final    Value: RARE WBC PRESENT,BOTH PMN AND MONONUCLEAR     FEW SQUAMOUS EPITHELIAL CELLS PRESENT     MODERATE GRAM POSITIVE COCCI IN PAIRS AND CHAINS     IN CLUSTERS RARE GRAM POSITIVE RODS   Culture     Final    Value: ABUNDANT STAPHYLOCOCCUS AUREUS     Note: RIFAMPIN AND GENTAMICIN SHOULD NOT BE USED AS SINGLE DRUGS FOR TREATMENT OF STAPH INFECTIONS.   Report Status PENDING   Incomplete   GRAM STAIN     Status: Normal   Collection Time   11/17/11 10:30 AM      Component Value Range Status Comment   Specimen Description CSF   Final    Special Requests NONE   Final    Gram Stain     Final    Value: CYTOSPIN SLIDE:     WBC PRESENT, PREDOMINANTLY MONONUCLEAR     NO ORGANISMS SEEN   Report Status 11/17/2011 FINAL   Final     Anti-infectives     Start     Dose/Rate Route Frequency Ordered Stop   11/16/11 1400   piperacillin-tazobactam (ZOSYN) IVPB 3.375 g        3.375 g 12.5 mL/hr over 240  Minutes Intravenous 3 times per day 11/16/11 0936     11/15/11 2200   vancomycin (VANCOCIN) 750 mg in sodium chloride 0.9 % 150 mL IVPB        750 mg 150 mL/hr over 60 Minutes Intravenous Every 24 hours 11/14/11 2043     11/15/11 1200   piperacillin-tazobactam (ZOSYN) IVPB 3.375 g  Status:  Discontinued        3.375 g 12.5 mL/hr over 240 Minutes Intravenous 4 times per day 11/15/11 0911 11/16/11 0936   11/15/11 0600   piperacillin-tazobactam (ZOSYN) IVPB 2.25 g  Status:  Discontinued        2.25 g 100 mL/hr over 30 Minutes Intravenous 4 times per day 11/15/11 0149 11/15/11 0911   11/15/11 0115   imipenem-cilastatin (PRIMAXIN) 500 mg in sodium chloride 0.9 % 100 mL IVPB  Status:  Discontinued        500 mg 200 mL/hr over 30 Minutes Intravenous  Once 11/15/11 0109 11/15/11 0141   11/14/11 2200   piperacillin-tazobactam (ZOSYN) IVPB 3.375 g  Status:  Discontinued        3.375 g 12.5 mL/hr over 240 Minutes Intravenous 3 times per day 11/14/11 2043 11/15/11 0132   11/14/11 2100   vancomycin (VANCOCIN) IVPB 1000 mg/200 mL premix        1,000 mg 200 mL/hr over 60 Minutes Intravenous NOW 11/14/11 2043 11/14/11 2200          Assessment: 46 YOF with Type 1 DM, hx of EtOH abuse and noncompliance from [facility] admitted for AMS and DKA.   Vanc/zosyn D#4 for r/o sepsis - hx latent TB, abundant SA in endotracheal cutlure. Tmax 100.3. WBC decreased. PCT 9.15 (4/30). Vancomycin trough drawn ~9 hours early (pt on q24h dosing) - trough = 13.4 mcg/ml so can expect true trough to be much less than 13.4 mcg/ml. ARF improving, Creatinine down to 1.3 (baseline 1.1), UOP remains scant.  Vanc 4/30 >> Zosyn 4/30 >>  5/3 CSF-  4/30 Blood Cx - NGTD 4/30 Resp Cx - abundant staph aureus  4/30 Urine Cx- 4K Ecoli pan sensitive MRSA (-)  Goal of Therapy:  Vancomycin Trough 15-20 mcg/ml  Plan:  1. Continue Zosyn 3.375gm IV q8h 2. Change vancomycin to 750mg  IV q12h. 3. Recheck level at Css, especially  if UOP continues to be low.  Christoper Fabian, PharmD, BCPS Clinical pharmacist, pager [phone] 11/17/2011,10:09 PM

## 2011-11-17 NOTE — Procedures (Signed)
Lumbar puncture performed under fluoro guidance at L3-4.  Opening pressure elevated at 38cm H2O.  CSF initially very slightly cloudy but cleared over 4 tubes.  11cc total removed.  No immediate complications.
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
## 2011-11-18 ENCOUNTER — Inpatient Hospital Stay (HOSPITAL_COMMUNITY): Payer: Medicaid Other

## 2011-11-18 LAB — COMPREHENSIVE METABOLIC PANEL
AST: 99 U/L — ABNORMAL HIGH (ref 0–37)
CO2: 20 mEq/L (ref 19–32)
Calcium: 6.7 mg/dL — ABNORMAL LOW (ref 8.4–10.5)
Creatinine, Ser: 1.23 mg/dL — ABNORMAL HIGH (ref 0.50–1.10)
GFR calc Af Amer: 60 mL/min — ABNORMAL LOW (ref >90)
GFR calc non Af Amer: 51 mL/min — ABNORMAL LOW (ref >90)
Glucose, Bld: 333 mg/dL — ABNORMAL HIGH (ref 70–99)
Total Protein: 6.7 g/dL (ref 6.0–8.3)

## 2011-11-18 LAB — GLUCOSE, CAPILLARY
Glucose-Capillary: 114 mg/dL — ABNORMAL HIGH (ref 70–99)
Glucose-Capillary: 127 mg/dL — ABNORMAL HIGH (ref 70–99)
Glucose-Capillary: 127 mg/dL — ABNORMAL HIGH (ref 70–99)
Glucose-Capillary: 135 mg/dL — ABNORMAL HIGH (ref 70–99)
Glucose-Capillary: 146 mg/dL — ABNORMAL HIGH (ref 70–99)
Glucose-Capillary: 170 mg/dL — ABNORMAL HIGH (ref 70–99)
Glucose-Capillary: 185 mg/dL — ABNORMAL HIGH (ref 70–99)

## 2011-11-18 LAB — CULTURE, RESPIRATORY W GRAM STAIN

## 2011-11-18 LAB — CBC
Hemoglobin: 8.3 g/dL — ABNORMAL LOW (ref 12.0–15.0)
RBC: 4.16 MIL/uL (ref 3.87–5.11)

## 2011-11-18 LAB — PROCALCITONIN: Procalcitonin: 1.32 ng/mL

## 2011-11-18 MED ORDER — INSULIN ASPART 100 UNIT/ML ~~LOC~~ SOLN
0.0000 [IU] | SUBCUTANEOUS | Status: DC
  Administered 2011-11-18: 5 [IU] via SUBCUTANEOUS
  Administered 2011-11-18: 3 [IU] via SUBCUTANEOUS
  Administered 2011-11-18 (×2): 8 [IU] via SUBCUTANEOUS
  Administered 2011-11-19 (×3): 3 [IU] via SUBCUTANEOUS
  Administered 2011-11-19: 5 [IU] via SUBCUTANEOUS
  Administered 2011-11-19: 2 [IU] via SUBCUTANEOUS
  Administered 2011-11-20: 3 [IU] via SUBCUTANEOUS
  Administered 2011-11-20: 5 [IU] via SUBCUTANEOUS
  Administered 2011-11-20: 8 [IU] via SUBCUTANEOUS
  Administered 2011-11-20 (×3): 3 [IU] via SUBCUTANEOUS
  Administered 2011-11-21 (×2): 5 [IU] via SUBCUTANEOUS
  Administered 2011-11-21: 3 [IU] via SUBCUTANEOUS
  Administered 2011-11-21 (×2): 5 [IU] via SUBCUTANEOUS
  Administered 2011-11-22: 3 [IU] via SUBCUTANEOUS
  Administered 2011-11-22: 2 [IU] via SUBCUTANEOUS
  Administered 2011-11-23: 8 [IU] via SUBCUTANEOUS
  Administered 2011-11-23: 2 [IU] via SUBCUTANEOUS

## 2011-11-18 MED ORDER — LORAZEPAM 2 MG/ML IJ SOLN
2.0000 mg | INTRAMUSCULAR | Status: DC | PRN
  Administered 2011-11-18 – 2011-11-21 (×10): 2 mg via INTRAVENOUS
  Filled 2011-11-18 (×11): qty 1

## 2011-11-18 MED ORDER — DEXTROSE 5 % IV SOLN
2.0000 g | INTRAVENOUS | Status: AC
  Administered 2011-11-18 – 2011-11-21 (×4): 2 g via INTRAVENOUS
  Filled 2011-11-18 (×4): qty 2

## 2011-11-18 MED ORDER — INSULIN GLARGINE 100 UNIT/ML ~~LOC~~ SOLN
20.0000 [IU] | Freq: Every day | SUBCUTANEOUS | Status: DC
  Administered 2011-11-18 – 2011-11-19 (×2): 20 [IU] via SUBCUTANEOUS

## 2011-11-18 MED ORDER — SODIUM CHLORIDE 0.9 % IV BOLUS (SEPSIS)
500.0000 mL | Freq: Once | INTRAVENOUS | Status: AC
  Administered 2011-11-18: 11:00:00 via INTRAVENOUS

## 2011-11-18 MED ORDER — HEPARIN SODIUM (PORCINE) 5000 UNIT/ML IJ SOLN
5000.0000 [IU] | Freq: Three times a day (TID) | INTRAMUSCULAR | Status: DC
  Administered 2011-11-18 – 2011-11-19 (×5): 5000 [IU] via SUBCUTANEOUS
  Filled 2011-11-18 (×7): qty 1

## 2011-11-18 MED ORDER — SODIUM CHLORIDE 0.9 % IV SOLN
0.4000 ug/kg/h | INTRAVENOUS | Status: DC
  Administered 2011-11-18 (×2): 0.8 ug/kg/h via INTRAVENOUS
  Administered 2011-11-18: 0.4 ug/kg/h via INTRAVENOUS
  Administered 2011-11-18: 0.9 ug/kg/h via INTRAVENOUS
  Administered 2011-11-19: 0.8 ug/kg/h via INTRAVENOUS
  Filled 2011-11-18 (×7): qty 2

## 2011-11-18 MED ORDER — POTASSIUM CHLORIDE 20 MEQ/15ML (10%) PO LIQD
40.0000 meq | Freq: Once | ORAL | Status: AC
  Administered 2011-11-18: 40 meq
  Filled 2011-11-18: qty 30

## 2011-11-18 NOTE — Progress Notes (Signed)
Name: [name] MRN: [id number] DOB: 05/29/1966    LOS: 4  PCCM PROGRESS NOTE  46 y.o. female smoker admitted on 11/14/2011 with metabolic encephalopathy 2nd to hyperglycemia/hypernatremia/acidosis in setting of DM type I with non-compliance complicated by respiratory failure and shock.   Hx of Hypothyroidism, Bipolar, organic brain syndrome, Hyperlipidemia, CAD, Cocaine/ETOH abuse, HTN, Syphilis    Line/tube: Rt IJ CVL 4/30>> ETT 4/30>> OGT 4/30>> Lt radial Aline 4/30>>5/2  Cultures: MRSA screen 4/30>>negative Urine 4/30>> E. Coli 4000 colonies Blood 4/30>>NTD Sputum 4/30>>Staph Aureus CSF 5/3>>NTD  Antibiotics: Vancomycin 4/30>>5/4 Zosyn 4/30>>5/4 Ceftriaxone 5/4>>  Tests/events: 4/30 CT head>>negative 5/2 Seizure 5/2 EEG>>The spontaneously reactive delta and theta activities suggest a moderate encephalopathy of nonspecific etiology. 5/3 LP  SUBJECTIVE: No overnight issues.  Intermittently agitated.  OBJECTIVE:  Blood pressure 125/85, pulse 101, temperature 100 F (37.8 C), temperature source Core (Comment), resp. rate 14, height 5\' 2"  (1.575 m), weight 71.2 kg (156 lb 15.5 oz), last menstrual period 11/01/2011, SpO2 100.00%. Wt Readings from Last 3 Encounters:  11/18/11 71.2 kg (156 lb 15.5 oz)  07/25/11 62.279 kg (137 lb 4.8 oz)  04/04/11 62.687 kg (138 lb 3.2 oz)   Body mass index is 28.71 kg/(m^2).  I/O last 3 completed shifts: In: 2576.4 [I.V.:1816.4; NG/GT:150; IV Piggyback:610] Out: 0   Vent Mode:  [-] PRVC FiO2 (%):  [30 %-40 %] 30 % Set Rate:  [14 bmp] 14 bmp Vt Set:  [400 mL] 400 mL PEEP:  [5 cmH20] 5 cmH20 Plateau Pressure:  [21 cmH20-26 cmH20] 26 cmH20  Physical Exam: General - Comfortable HEENT - ETT/OGT Cardiac - Tachycardic, regular Chest - Bilateral air entry Abd - Soft, bowel sounds present Ext - No edema Neuro - Sedated  CBC    Component Value Date/Time   WBC 11.7* 11/18/2011 0504   RBC 4.16 11/18/2011 0504   HGB 8.3* 11/18/2011  0504   HCT 26.4* 11/18/2011 0504   PLT 128* 11/18/2011 0504   MCV 63.5* 11/18/2011 0504   MCH 20.0* 11/18/2011 0504   MCHC 31.4 11/18/2011 0504   RDW 16.5* 11/18/2011 0504   LYMPHSABS 1.4 11/14/2011 1827   MONOABS 2.0* 11/14/2011 1827   EOSABS 0.0 11/14/2011 1827   BASOSABS 0.0 11/14/2011 1827    BMET    Component Value Date/Time   NA 141 11/18/2011 0504   K 3.7 11/18/2011 0504   CL 110 11/18/2011 0504   CO2 20 11/18/2011 0504   GLUCOSE 333* 11/18/2011 0504   BUN 20 11/18/2011 0504   CREATININE 1.23* 11/18/2011 0504   CALCIUM 6.7* 11/18/2011 0504   GFRNONAA 51* 11/18/2011 0504   GFRAA 60* 11/18/2011 0504    Lab Results  Component Value Date   ALT 85* 11/18/2011   AST 99* 11/18/2011   ALKPHOS 125* 11/18/2011   BILITOT 0.3 11/18/2011   ABG    Component Value Date/Time   PHART 7.294* 11/17/2011 0331   PCO2ART 43.9 11/17/2011 0331   PO2ART 40.0* 11/17/2011 0331   HCO3 21.2 11/17/2011 0331   TCO2 23 11/17/2011 0331   ACIDBASEDEF 5.0* 11/17/2011 0331   O2SAT 68.0 11/17/2011 0331    Dg Chest Port 1 View  11/18/2011  *RADIOLOGY REPORT*  Clinical Data: Atelectasis, respiratory failure  PORTABLE CHEST - 1 VIEW  Comparison:   the previous day's study  Findings: Endotracheal tube, nasogastric tube, and right IJ central line are stable in position.  Improved aeration of the right lung base.  No new infiltrate.  Heart size upper limits normal.  No effusion.  IMPRESSION:  1.  Improving right lower lobe aeration. 2. Support hardware stable in position.  Original Report Authenticated By: Osa Craver, M.D.   Dg Chest Port 1 View  11/17/2011  *RADIOLOGY REPORT*  Clinical Data: Respiratory failure  PORTABLE CHEST - 1 VIEW  Comparison: Chest radiograph 11/16/2011  Findings: Endotracheal tube and right central venous line are unchanged.  There is increased atelectasis at the right lung base. No overt pulmonary edema.  No pleural fluid or pneumothorax.  IMPRESSION:  1.  Stable support apparatus.  2.   Increased atelectasis at the right  lung base.  Original Report Authenticated By: Genevive Bi, M.D.   Dg Chest Port 1 View  11/16/2011  *RADIOLOGY REPORT*  Clinical Data: Respiratory failure  PORTABLE CHEST - 1 VIEW  Comparison: 11/14/2011  Findings: Endotracheal tube at the thoracic inlet.  Stable right IJ venous catheter and enteric tube coursing below the diaphragm.  Lungs are essentially clear.  No pleural effusion or pneumothorax.  Cardiomediastinal silhouette is within normal limits.  IMPRESSION: Endotracheal tube at the thoracic inlet.  Stable support apparatus.  Original Report Authenticated By: Charline Bills, M.D.   Dg Fluoro Guide Lumbar Puncture  11/17/2011  Clinical Data: Change mental status, new onset seizure, fever, history of syphilis;  LUMBAR PUNCTURE FLUORO GUIDE  Fluoro time:  0.6 minutes.  Comparison: Head CT from 11/14/2011 reviewed.  Findings:  I discussed the risks, benefits, and alternatives to lumbar puncture with the patient prior to the procedure. Emphasized risks included bleeding, infection, and headache. Epidural hematoma requiring surgical evacuation was discussed.  The patient voiced understanding and wished to proceed.  Written informed consent was obtained.  Using fluoroscopic guidance, an appropriate skin site was identified at the midline back.  This area of skin was prepped and draped in the usual sterile fashion.  Anesthesia of the skin and deeper soft tissues was obtained using 1% lidocaine without epinephrine.  Using a standard 20 gauge spinal needle, the CSF space was punctured at the L3-L4 level on the second pass.  Clear CSF was obtained at this level. Opening pressure was 38 cm H2O.  A total of approximately 11 ml of clear CSF was obtained across four separate tubes.  IMPRESSION: Successful fluoro guided lumbar puncture.  CSF aliquots were sent to the laboratory for appropriate analysis.  Opening pressure is elevated at 38 cm H2O.  The procedure was well tolerated without evidence for immediate  complications.  Original Report Authenticated By: ERIC A. MANSELL, M.D.    ASSESSMENT/PLAN:  Hyperosmolar, DKA with hx of type 1 DM  -d/c insulin gtt -Lantus 20 units now and then qhs -start SSI/CBG -d/c dextrose in IV fluid as on TF at goal  Acute respiratory failure 2nd to altered mental status, acidosis.  Hx of smoking. -continue mechanical support while sedation is weaned -SBT -ABG AM -prn bronchodilator therapy  Shock 2nd to hypovolemia, ?sepsis, and probable PEEPi >>> resolved -will narrow ABx to Ceftriaxone -d/c Zosyn / Vancomycin -repeat PCT -CBC AM  Acute renal failure 2nd to hypovolemia, and shock.  Baseline creatinine 1.1 from January 2012.  CVP today 10.  Anuric. -CVP q4h -NS 500 mL bolus -monitor renal fx and urine outpt  Hypokalemia -KCl 40 -BMP AM  Acute metabolic encephalopathy 2nd to hyperglycemia, acidosis, hypotension, elevated ammonia.  Hx of substance abuse, bipolar, organic brain syndrome.  UDS positive cocaine. -d/c continuous Fentanyl -d/c Versed -start Precedex gtt -start Ativan / fentanyl PRN -hold depakote, risperdal for  now -added lactulose per tube 5/1>>ammonia level improved>>changed lactulose to qdaily 5/2  Suspected partial Seizure 5/2, none since.  No EEG evidence of seizures. -d/c Keppra per Neurology -Follow LP results  Elevated LFT's with hx of ETOH -f/u LFT's intermittently  Protein calorie malnutrition -tube feeds -multivitamin  Hx of CAD, hyperlipidemia -LFT's elevated>>hold simvastatin -continue ASA 81 mg qdaily  Reported hx of hypothyroidism>>TSH 5.32 from 4/30 -will need repeat TSH when more stable and then decide if thyroid replacement therapy is needed  Best practice -xalatan 0.005% one gtt each eye qhs -protonix for SUP -restart heparin SQ for DVT Px (was held for LP)  The patient is critically ill with multiple organ systems failure and requires high complexity decision making for assessment and support,  frequent evaluation and titration of therapies, application of advanced monitoring technologies and extensive interpretation of multiple databases. Critical Care Time devoted to patient care services described in this note is 40 minutes.  Orlean Bradford, M.D., F.C.C.P. Pulmonary and Critical Care Medicine [facility] Cell: [phone] Pager: [phone]

## 2011-11-19 ENCOUNTER — Inpatient Hospital Stay (HOSPITAL_COMMUNITY): Payer: Medicaid Other

## 2011-11-19 LAB — BASIC METABOLIC PANEL
BUN: 23 mg/dL (ref 6–23)
CO2: 23 mEq/L (ref 19–32)
Calcium: 6.7 mg/dL — ABNORMAL LOW (ref 8.4–10.5)
Creatinine, Ser: 1.21 mg/dL — ABNORMAL HIGH (ref 0.50–1.10)
Glucose, Bld: 224 mg/dL — ABNORMAL HIGH (ref 70–99)

## 2011-11-19 LAB — POCT I-STAT 3, ART BLOOD GAS (G3+)
Bicarbonate: 20.7 mEq/L (ref 20.0–24.0)
TCO2: 22 mmol/L (ref 0–100)
pCO2 arterial: 31.3 mmHg — ABNORMAL LOW (ref 35.0–45.0)
pH, Arterial: 7.425 — ABNORMAL HIGH (ref 7.350–7.400)

## 2011-11-19 LAB — CBC
Hemoglobin: 7.7 g/dL — ABNORMAL LOW (ref 12.0–15.0)
MCH: 19.9 pg — ABNORMAL LOW (ref 26.0–34.0)
MCV: 62.4 fL — ABNORMAL LOW (ref 78.0–100.0)
RBC: 3.86 MIL/uL — ABNORMAL LOW (ref 3.87–5.11)

## 2011-11-19 LAB — GLUCOSE, CAPILLARY
Glucose-Capillary: 217 mg/dL — ABNORMAL HIGH (ref 70–99)
Glucose-Capillary: 163 mg/dL — ABNORMAL HIGH (ref 70–99)
Glucose-Capillary: 132 mg/dL — ABNORMAL HIGH (ref 70–99)

## 2011-11-19 MED ORDER — SODIUM CHLORIDE 0.9 % IV SOLN
0.4000 ug/kg/h | INTRAVENOUS | Status: DC
  Administered 2011-11-19: 0.9 ug/kg/h via INTRAVENOUS
  Administered 2011-11-19 (×2): 1.2 ug/kg/h via INTRAVENOUS
  Administered 2011-11-19: 1 ug/kg/h via INTRAVENOUS
  Administered 2011-11-20 (×2): 1.2 ug/kg/h via INTRAVENOUS
  Filled 2011-11-19 (×12): qty 4

## 2011-11-19 MED ORDER — NYSTATIN 100000 UNIT/ML MT SUSP
5.0000 mL | Freq: Four times a day (QID) | OROMUCOSAL | Status: DC
  Administered 2011-11-19 – 2011-11-28 (×29): 500000 [IU] via ORAL
  Filled 2011-11-19 (×40): qty 5

## 2011-11-19 NOTE — Progress Notes (Signed)
Name: [name] MRN: [id number] DOB: [birth year]    LOS: 5  Active Problems:  DKA, type 1  Encephalopathy acute  Acute renal failure  Acute respiratory failure  DIABETES MELLITUS, TYPE I  HYPERLIPIDEMIA  SUBSTANCE ABUSE, MULTIPLE  Microcytic anemia  Coronary arteriosclerosis   PCCM PROGRESS NOTE  Brief patient summary: 46 yo smoker admitted on 4/30 with metabolic encephalopathy 2nd to hyperglycemia/hypernatremia/acidosis in setting of DM type I with non-compliance complicated by respiratory failure and shock.  Hx of Hypothyroidism, Bipolar, organic brain syndrome, Hyperlipidemia, CAD, Cocaine/ETOH abuse, HTN, Syphilis    Line/tube: Rt IJ CVL 4/30>> ETT 4/30>> OGT 4/30>> Lt radial Aline 4/30>>5/2  Cultures: MRSA screen 4/30>>negative Urine 4/30>> E. Coli 4000 colonies Blood 4/30>>NTD Sputum 4/30>>Staph Aureus CSF 5/3>>NTD  Antibiotics: Vancomycin 4/30>>5/4 Zosyn 4/30>>5/4 Ceftriaxone 5/4>>  Tests/events: 4/30 CT head>>negative 5/2 Seizure 5/2 EEG>>The spontaneously reactive delta and theta activities suggest a moderate encephalopathy of nonspecific etiology. 5/3 LP  Interval history:  No overnight issues.  Remains itermittently agitated.  Vital signs:  Blood pressure 149/82, pulse 71, temperature 98.3 F (36.8 C), temperature source Core (Comment), resp. rate 26, height 5\' 2"  (1.575 m), weight 160 lb 11.5 oz (72.9 kg), last menstrual period 11/01/2011, SpO2 99.00%. Wt Readings from Last 3 Encounters:  11/19/11 160 lb 11.5 oz (72.9 kg)  07/25/11 137 lb 4.8 oz (62.279 kg)  04/04/11 138 lb 3.2 oz (62.687 kg)   I/O last 3 completed shifts: In: 3359.4 [I.V.:1379.4; Other:30; NG/GT:1270; IV Piggyback:680] Out: 2575 [Urine:2575]  Physical Exam:  General - Comfortable HEENT - ETT/OGT, mm moist, oral thrush  Cardiac - Tachycardic, regular Chest - resps even non labored on vent,  Slightly diminished bases otherwise cta  Abd - Soft, bowel sounds present Ext -  No edema Neuro - Sedated, intermittently agitated per nsg   Ventilator Settings: Vent Mode:  [-] PRVC FiO2 (%):  [30 %] 30 % Set Rate:  [14 bmp] 14 bmp Vt Set:  [400 mL] 400 mL PEEP:  [5 cmH20] 5 cmH20 Pressure Support:  [10 cmH20] 10 cmH20 Plateau Pressure:  [23 cmH20-35 cmH20] 29 cmH20  ASSESSMENT AND PLAN  NEUROLOGIC A:  Acute encephalopathy.  Requires high rates of Fentanyl / Versed for sedation. Suspected seizures on admission but non since and negative EEG.  Cocaine abuse (positive toxicology screen on admission).  History of syphilis. On Depakote / Risperdal preadmission. P: -->  Start Precedex gtt -->  D/c Fentanyl / Versed -->  Holding Risperdal / Depakote -->  D/c Lactulose -->  Daily WUA  PULMONARY  Lab 11/19/11 0445 11/17/11 0331 11/16/11 0356 11/15/11 1201 11/15/11 0454  PHART 7.425* 7.294* 7.272* 7.348* 7.559*  PCO2ART 31.3* 43.9 42.2 37.9 13.2*  PO2ART 93.0 40.0* 144.0* 150.0* 177.0*  HCO3 20.7 21.2 19.2* 20.8 11.8*  O2SAT 98.0 68.0 99.0 99.0 100.0   A:  Acute respiratory failure secondary to inability to protect airway and suspected aspiration pneumonia.  Mental status appears to be the limiting factor for extubation. P: -->  Continue full support -->  Daily CBT -->  Bronchodilators PRN  CARDIOVASCULAR  Lab 11/15/11 1100 11/14/11 1714  LATICACIDVEN 1.6 7.7*   A:  History of CAD P: -->  ASA -->  Holding simvastatin due to abnormal LFTs  RENAL  Lab 11/19/11 0428 11/18/11 0504 11/17/11 0458 11/16/11 1700 11/16/11 0923 11/16/11 0440 11/15/11 1100  NA 144 141 143 146* 149* -- --  K 3.9 3.7 -- -- -- -- --  CL 114* 110 112 115* 120* -- --  CO2 23 20 23 22 20  -- --  GLUCOSE 224* 333* 170* 206* 214* -- --  BUN 23 20 16 19 23  -- --  CREATININE 1.21* 1.23* 1.30* 1.47* 1.61* -- --  CALCIUM 6.7* 6.7* 6.5* 6.7* 7.1* -- --  MG -- -- -- 1.4* -- 1.6 2.0  PHOS -- -- -- 4.1 -- 1.0* 0.6*   A:  Acute renal failure secondary to shock / dehydration,  improving. P: -->  BMP in AM  GASTROINTESTINAL  Lab 11/18/11 0504 11/16/11 0440 11/14/11 1827  AST 99* 320* 19  ALT 85* 114* 10  ALKPHOS 125* 90 88  BILITOT 0.3 0.2* 0.1*  PROT 6.7 6.0 6.9  ALBUMIN 2.6* 2.5* 3.2*   A:  Malnutrition. Shocked liver, improving. P: -->  TF -->  MVT  HEMATOLOGIC  Lab 11/19/11 0428 11/18/11 0504 11/17/11 0458 11/16/11 0440 11/14/11 1827  HCT 24.1* 26.4* 25.1* 29.3* 37.5  INR -- -- -- -- --  APTT -- -- -- -- --   A:  Chronic anemia, stable. P: -->  CBC in AM -->  GI Px  INFECTIOUS  Lab 11/19/11 0428 11/18/11 1430 11/18/11 0504 11/17/11 0458 11/16/11 0440 11/14/11 2205 11/14/11 1827  WBC 8.4 -- 11.7* 10.7* 12.4* -- 28.8*  PROCALCITON -- 1.32 -- -- -- 9.15 --   A:  Suspected pneumonia.  Improved WBC / PCT. P: -->  Continue Ceftriaxone through 5/9  ENDOCRINE  Lab 11/19/11 1141 11/19/11 0713 11/19/11 0342 11/18/11 2349 11/18/11 2016  GLUCAP 163* 217* 183* 223* 253*   A:  DM.  Resolved DKA, HHS. P: -->  SSI/CBG -->  Lantus  BEST PRACTICE / DISPOSITION -->  ICU status under PCCM -->  Full code -->  TF -->  Heparin [place] for DVT Px -->  Protonix IV for GI Px -->  Ventilator bundle -->  Family is not available  WHITEHEART,KATHRYN, NP 11/19/2011  10:23 AM Pager: [phone]  Care during the described time interval was provided by me and/or other providers on the critical care team. I have reviewed this patient's available data, including medical history, events of note, physical examination and test results as part of my evaluation.  The patient is critically ill with multiple organ systems failure and requires high complexity decision making for assessment and support, frequent evaluation and titration of therapies, application of advanced monitoring technologies and extensive interpretation of multiple databases. Critical Care Time devoted to patient care services described in this note is 35 minutes.  Orlean Bradford, M.D.,  F.C.C.P. Pulmonary and Critical Care Medicine [facility] [place] Cell: [phone] Pager: [phone]

## 2011-11-20 ENCOUNTER — Inpatient Hospital Stay (HOSPITAL_COMMUNITY): Payer: Medicaid Other

## 2011-11-20 DIAGNOSIS — I2510 Atherosclerotic heart disease of native coronary artery without angina pectoris: Secondary | ICD-10-CM

## 2011-11-20 LAB — CULTURE, BLOOD (ROUTINE X 2): Culture: NO GROWTH

## 2011-11-20 LAB — BASIC METABOLIC PANEL
CO2: 27 mEq/L (ref 19–32)
Calcium: 7.5 mg/dL — ABNORMAL LOW (ref 8.4–10.5)
GFR calc Af Amer: >90 mL/min (ref >90)
GFR calc non Af Amer: 81 mL/min — ABNORMAL LOW (ref >90)
Sodium: 146 mEq/L — ABNORMAL HIGH (ref 135–145)

## 2011-11-20 LAB — BODY FLUID CULTURE
Culture: NO GROWTH
Special Requests: NORMAL

## 2011-11-20 LAB — CBC
MCH: 19.7 pg — ABNORMAL LOW (ref 26.0–34.0)
Platelets: 156 10*3/uL (ref 150–400)
RBC: 3.85 MIL/uL — ABNORMAL LOW (ref 3.87–5.11)
WBC: 6.7 10*3/uL (ref 4.0–10.5)

## 2011-11-20 LAB — GLUCOSE, CAPILLARY
Glucose-Capillary: 168 mg/dL — ABNORMAL HIGH (ref 70–99)
Glucose-Capillary: 171 mg/dL — ABNORMAL HIGH (ref 70–99)
Glucose-Capillary: 208 mg/dL — ABNORMAL HIGH (ref 70–99)

## 2011-11-20 MED ORDER — INSULIN GLARGINE 100 UNIT/ML ~~LOC~~ SOLN
25.0000 [IU] | Freq: Every day | SUBCUTANEOUS | Status: DC
  Administered 2011-11-20: 25 [IU] via SUBCUTANEOUS

## 2011-11-20 MED ORDER — POTASSIUM CHLORIDE CRYS ER 20 MEQ PO TBCR
40.0000 meq | EXTENDED_RELEASE_TABLET | Freq: Once | ORAL | Status: AC
  Administered 2011-11-20: 40 meq via ORAL

## 2011-11-20 MED ORDER — RISPERIDONE 2 MG PO TABS
2.0000 mg | ORAL_TABLET | Freq: Two times a day (BID) | ORAL | Status: DC
  Administered 2011-11-20 – 2011-11-21 (×3): 2 mg
  Filled 2011-11-20 (×6): qty 1

## 2011-11-20 NOTE — Progress Notes (Signed)
SBT performed pt failed sue to increase RR to 40s and Vt 150s despite increase in PS to 14/5

## 2011-11-20 NOTE — Progress Notes (Signed)
Name: [name] MRN: [id number] DOB: 08-Jul-1966    LOS: 6  Active Problems:  DIABETES MELLITUS, TYPE I  HYPERLIPIDEMIA  Microcytic anemia  SUBSTANCE ABUSE, MULTIPLE  DKA, type 1  Encephalopathy acute  Acute renal failure  Acute respiratory failure  Coronary arteriosclerosis   PCCM PROGRESS NOTE  Brief patient summary: 46 yo smoker admitted on 4/30 with metabolic encephalopathy 2nd to hyperglycemia/hypernatremia/acidosis in setting of DM type I with non-compliance complicated by respiratory failure and shock.  Hx of Hypothyroidism, Bipolar, organic brain syndrome, Hyperlipidemia, CAD, Cocaine/ETOH abuse, HTN, Syphilis    Line/tube: Rt IJ CVL 4/30>> ETT 4/30>> OGT 4/30>> Lt radial Aline 4/30>>5/2  Cultures: MRSA screen 4/30>>negative Urine 4/30>> E. Coli (pansens) Blood 4/30>>NTD Sputum 4/30>>Staph Aureus (MSSA) CSF 5/3>>NTD  Antibiotics: Vancomycin 4/30>>5/4 Zosyn 4/30>>5/4 Ceftriaxone 5/4>>plan stop 5/7  Tests/events: 4/30 CT head>>negative 5/2 Seizure 5/2 EEG>>The spontaneously reactive delta and theta activities suggest a moderate encephalopathy of nonspecific etiology. 5/3 LP  Interval history:  precedex improved with   Vital signs:  Blood pressure 168/75, pulse 67, temperature 99.8 F (37.7 C), temperature source Core (Comment), resp. rate 15, height 5\' 2"  (1.575 m), weight 73.3 kg (161 lb 9.6 oz), last menstrual period 11/01/2011, SpO2 99.00%. Wt Readings from Last 3 Encounters:  11/20/11 73.3 kg (161 lb 9.6 oz)  07/25/11 62.279 kg (137 lb 4.8 oz)  04/04/11 62.687 kg (138 lb 3.2 oz)   I/O last 3 completed shifts: In: 3954.8 [I.V.:1474.8; Other:120; NG/GT:2310; IV Piggyback:50] Out: 3095 [Urine:2895; Stool:200]  Physical Exam:  General - Comfortable HEENT - ETT Cardiac - S1 S@ rrregular Chest -reduced Abd - Soft, bowel sounds present Ext - No edema Neuro - Sedated, intermittently agitated per nsg   Ventilator Settings: Vent Mode:  [-]  PRVC FiO2 (%):  [30 %] 30 % Set Rate:  [14 bmp] 14 bmp Vt Set:  [400 mL] 400 mL PEEP:  [5 cmH20] 5 cmH20 Pressure Support:  [5 cmH20] 5 cmH20 Plateau Pressure:  [26 cmH20-35 cmH20] 28 cmH20  ASSESSMENT AND PLAN  NEUROLOGIC A:  Acute encephalopathy.  Requires high rates of Fentanyl / Versed for sedation. Suspected seizures on admission but non since and negative EEG.  Cocaine abuse (positive toxicology screen on admission).  History of syphilis. On Depakote / Risperdal preadmission. P: -->  tolerating Precedex gtt --> int benzo -->  Daily WUA  PULMONARY  Lab 11/19/11 0445 11/17/11 0331 11/16/11 0356 11/15/11 1201 11/15/11 0454  PHART 7.425* 7.294* 7.272* 7.348* 7.559*  PCO2ART 31.3* 43.9 42.2 37.9 13.2*  PO2ART 93.0 40.0* 144.0* 150.0* 177.0*  HCO3 20.7 21.2 19.2* 20.8 11.8*  O2SAT 98.0 68.0 99.0 99.0 100.0   A:  Acute respiratory failure secondary to inability to protect airway and suspected aspiration pneumonia.  Mental status appears to be the limiting factor for extubation. P: -->  abg reviewed -->  Daily SBt, attempt cpap 5 ps 5 assess rsbi, cough, follows commands -->  Bronchodilators PRN -may need neg balaance May need to tolerate RSBI  / rr elevation if volume ok with neuro issues  CARDIOVASCULAR  Lab 11/15/11 1100 11/14/11 1714  LATICACIDVEN 1.6 7.7*   A:  History of CAD P: -->  ASA -->  Holding simvastatin due to abnormal LFTs  RENAL  Lab 11/20/11 0430 11/19/11 0428 11/18/11 0504 11/17/11 0458 11/16/11 1700 11/16/11 0440 11/15/11 1100  NA 146* 144 141 143 146* -- --  K 3.4* 3.9 -- -- -- -- --  CL 113* 114* 110 112 115* -- --  CO2 27 23 20 23 22  -- --  GLUCOSE 241* 224* 333* 170* 206* -- --  BUN 15 23 20 16 19  -- --  CREATININE 0.84 1.21* 1.23* 1.30* 1.47* -- --  CALCIUM 7.5* 6.7* 6.7* 6.5* 6.7* -- --  MG -- -- -- -- 1.4* 1.6 2.0  PHOS -- -- -- -- 4.1 1.0* 0.6*   A:  Acute renal failure secondary to shock / dehydration, improving. P: -->  BMP in AM,  to 1/2 NS kvo, may need some lasix  GASTROINTESTINAL  Lab 11/18/11 0504 11/16/11 0440 11/14/11 1827  AST 99* 320* 19  ALT 85* 114* 10  ALKPHOS 125* 90 88  BILITOT 0.3 0.2* 0.1*  PROT 6.7 6.0 6.9  ALBUMIN 2.6* 2.5* 3.2*   A:  Malnutrition. Shocked liver, improving. P: -->  TF may need to hold for weaning -->  MVT  HEMATOLOGIC  Lab 11/20/11 0430 11/19/11 0428 11/18/11 0504 11/17/11 0458 11/16/11 0440  HCT 24.0* 24.1* 26.4* 25.1* 29.3*  INR -- -- -- -- --  APTT -- -- -- -- --   A:  Chronic anemia, stable. P: -->  CBC in AM -->  GI Px --> follow plat trend  INFECTIOUS  Lab 11/20/11 0430 11/19/11 0428 11/18/11 1430 11/18/11 0504 11/17/11 0458 11/16/11 0440 11/14/11 2205  WBC 6.7 8.4 -- 11.7* 10.7* 12.4* --  PROCALCITON -- -- 1.32 -- -- -- 9.15   A:  Suspected pneumonia.  Improved WBC / PCT. P: -->  Continue Ceftriaxone through 5/9  ENDOCRINE  Lab 11/20/11 0752 11/20/11 0339 11/19/11 2353 11/19/11 1913 11/19/11 1531  GLUCAP 268* 168* 171* 132* 108*   A:  DM.  Resolved DKA, HHS. P: -->  SSI/CBG -->  Lantus to 25  BEST PRACTICE / DISPOSITION -->  ICU status under PCCM -->  Full code -->  TF -->  Heparin [place] for DVT Px -->  Protonix IV for GI Px -->  Ventilator bundle -->  Family is not available    Care during the described time interval was provided by me and/or other providers on the critical care team. I have reviewed this patient's available data, including medical history, events of note, physical examination and test results as part of my evaluation.  The patient is critically ill with multiple organ systems failure and requires high complexity decision making for assessment and support, frequent evaluation and titration of therapies, application of advanced monitoring technologies and extensive interpretation of multiple databases. Critical Care Time devoted to patient care services described in this note is 35 minutes.  Mcarthur Rossetti. Tyson Alias, MD,  FACP Pgr: [phone] [place]

## 2011-11-21 ENCOUNTER — Encounter (HOSPITAL_COMMUNITY): Payer: Self-pay | Admitting: *Deleted

## 2011-11-21 ENCOUNTER — Inpatient Hospital Stay (HOSPITAL_COMMUNITY): Payer: Medicaid Other

## 2011-11-21 LAB — CULTURE, BLOOD (ROUTINE X 2)
Culture  Setup Time: 201305010122
Culture: NO GROWTH

## 2011-11-21 LAB — POCT I-STAT 3, ART BLOOD GAS (G3+)
Bicarbonate: 31.6 mEq/L — ABNORMAL HIGH (ref 20.0–24.0)
TCO2: 33 mmol/L (ref 0–100)
TCO2: 33 mmol/L (ref 0–100)
pCO2 arterial: 38.9 mmHg (ref 35.0–45.0)
pCO2 arterial: 38.5 mmHg (ref 35.0–45.0)
pH, Arterial: 7.517 — ABNORMAL HIGH (ref 7.350–7.400)
pH, Arterial: 7.53 — ABNORMAL HIGH (ref 7.350–7.400)
pO2, Arterial: 60 mmHg — ABNORMAL LOW (ref 80.0–100.0)
pO2, Arterial: 69 mmHg — ABNORMAL LOW (ref 80.0–100.0)

## 2011-11-21 LAB — GLUCOSE, CAPILLARY
Glucose-Capillary: 118 mg/dL — ABNORMAL HIGH (ref 70–99)
Glucose-Capillary: 165 mg/dL — ABNORMAL HIGH (ref 70–99)
Glucose-Capillary: 228 mg/dL — ABNORMAL HIGH (ref 70–99)

## 2011-11-21 LAB — CBC
Hemoglobin: 7.6 g/dL — ABNORMAL LOW (ref 12.0–15.0)
MCHC: 31.9 g/dL (ref 30.0–36.0)
Platelets: 184 10*3/uL (ref 150–400)
RDW: 15.3 % (ref 11.5–15.5)

## 2011-11-21 LAB — COMPREHENSIVE METABOLIC PANEL
ALT: 33 U/L (ref 0–35)
CO2: 29 mEq/L (ref 19–32)
Calcium: 8 mg/dL — ABNORMAL LOW (ref 8.4–10.5)
Chloride: 107 mEq/L (ref 96–112)
GFR calc Af Amer: >90 mL/min (ref >90)
GFR calc non Af Amer: >90 mL/min (ref >90)
Glucose, Bld: 248 mg/dL — ABNORMAL HIGH (ref 70–99)
Sodium: 143 mEq/L (ref 135–145)
Total Bilirubin: 0.1 mg/dL — ABNORMAL LOW (ref 0.3–1.2)

## 2011-11-21 MED ORDER — VALPROATE SODIUM 500 MG/5ML IV SOLN
250.0000 mg | Freq: Three times a day (TID) | INTRAVENOUS | Status: DC
  Administered 2011-11-21 – 2011-11-23 (×6): 250 mg via INTRAVENOUS
  Filled 2011-11-21 (×9): qty 2.5

## 2011-11-21 MED ORDER — BIOTENE DRY MOUTH MT LIQD
15.0000 mL | Freq: Two times a day (BID) | OROMUCOSAL | Status: DC
  Administered 2011-11-22 – 2011-11-28 (×11): 15 mL via OROMUCOSAL

## 2011-11-21 MED ORDER — FUROSEMIDE 10 MG/ML IJ SOLN
20.0000 mg | Freq: Two times a day (BID) | INTRAMUSCULAR | Status: DC
  Administered 2011-11-21 – 2011-11-22 (×3): 20 mg via INTRAVENOUS
  Filled 2011-11-21 (×4): qty 2

## 2011-11-21 MED ORDER — ACETAMINOPHEN 650 MG RE SUPP
650.0000 mg | Freq: Once | RECTAL | Status: DC
  Filled 2011-11-21: qty 1

## 2011-11-21 MED ORDER — ACETAMINOPHEN 10 MG/ML IV SOLN
1000.0000 mg | Freq: Once | INTRAVENOUS | Status: AC
  Administered 2011-11-21: 1000 mg via INTRAVENOUS
  Filled 2011-11-21: qty 100

## 2011-11-21 MED ORDER — INSULIN GLARGINE 100 UNIT/ML ~~LOC~~ SOLN
30.0000 [IU] | Freq: Every day | SUBCUTANEOUS | Status: DC
  Administered 2011-11-21: 30 [IU] via SUBCUTANEOUS

## 2011-11-21 MED ORDER — VALPROATE SODIUM 500 MG/5ML IV SOLN
750.0000 mg | Freq: Once | INTRAVENOUS | Status: DC
  Filled 2011-11-21: qty 7.5

## 2011-11-21 MED ORDER — CHLORHEXIDINE GLUCONATE 0.12 % MT SOLN
15.0000 mL | Freq: Two times a day (BID) | OROMUCOSAL | Status: DC
  Administered 2011-11-22 – 2011-11-28 (×12): 15 mL via OROMUCOSAL
  Filled 2011-11-21 (×17): qty 15

## 2011-11-21 MED ORDER — POTASSIUM CHLORIDE CRYS ER 20 MEQ PO TBCR
40.0000 meq | EXTENDED_RELEASE_TABLET | Freq: Once | ORAL | Status: AC
  Administered 2011-11-21: 40 meq via ORAL
  Filled 2011-11-21: qty 2

## 2011-11-21 MED ORDER — CHLORHEXIDINE GLUCONATE 0.12 % MT SOLN: 15.0000 mL | Freq: Two times a day (BID) | OROMUCOSAL | Status: DC

## 2011-11-21 NOTE — Plan of Care (Signed)
Problem: Consults Goal: Skin Care Protocol Initiated - if indicated If consults are not indicated, leave blank or document N/A  Outcome: Completed/Met Date Met:  11/21/11 Using barrier cream and flexiseal Goal: Nutrition Consult-if indicated Outcome: Completed/Met Date Met:  11/21/11 Was on continuous TF until today when pt was extubated  Problem: Phase II Progression Outcomes Goal: Date pt extubated/weaned off vent Outcome: Completed/Met Date Met:  11/21/11 12-22-11 Goal: Time pt extubated/weaned off vent Outcome: Completed/Met Date Met:  11/21/11 1230 Goal: Tolerating prescribed nutrition plan Outcome: Not Applicable Date Met:  11/21/11 Pt currently NPO since extubation, gastric tube was dc, pt to lethargic to take po at this time

## 2011-11-21 NOTE — Progress Notes (Addendum)
eLink Physician-Brief Progress Note Patient Name: [name] DOB: 01-04-1966 MRN: [id number]  Date of Service  11/21/2011   HPI/Events of Note  RN calling elink   - RR 35, shalllow and mildly paradoxical post extubation with HR 120s with alae nasii flaring but not agitated   - Also febrile  eICU Interventions  - check abg   - rectal tylenol for fever -> RN says cannot do due to diarrhea -> so will give IV tylenol  - check cxr   Intervention Category Intermediate Interventions: Other:  [name] 11/21/2011, 5:39 PM
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
## 2011-11-21 NOTE — Progress Notes (Signed)
eLink Physician-Brief Progress Note Patient Name: [name] DOB: 1966/04/04 MRN: [id number]  Date of Service  11/21/2011   HPI/Events of Note   resp distress. - Dr Kendrick Fries at bedside. Discussed we will plan to closely monitor. Will stop ativan PRN due to RASS -2  eICU Interventions  Check abg in 1h and monitor need for intubation   Intervention Category Major Interventions: Delirium, psychosis, severe agitation - evaluation and management;Respiratory failure - evaluation and management  Shonta Phillis 11/21/2011, 8:17 PM

## 2011-11-21 NOTE — Progress Notes (Signed)
Progress Note:  1600  Patient RR increased to 30s and HR ST in 120s. SpO2 99-100%  Patient continues to be very lethargic post intubation. RN encouraged patient to cough and deep breath. RN was unable to get patient to agree to practice IS or get up to a chair. Patient put in chair position in bed. Pt continues to take shallow breaths.   Temp also increased to 100. Blankets off and room temp turned down.  CCM made aware.   1700   Pt RR now in 30-40s and HR ST 120-130s. Sp02 99-100% Patient lethargic (RASS -2) but continues to follow commands. RN voiced concern about mental status and risk of residual sedative medications given days prior to extubation. (Note: patient was completely weaned off precedex gtt at 1040; No benzo medications were given on day shift 5/7.)     Blood gas ordered. pH 7.517  pCO2 38.9  PO2 60  HCO 31.6 Gas reported to Dr. Marchelle Gearing. No interventions ordered at this time.  Temp increasing 102.3. RN could not administer PO or rectal tylenol due to NPO status and diarrhea. Dr. Marchelle Gearing ordered IV tylenol. Waiting for arrival from pharmacy. Ice packs applied to patient under arms.    1800   RN called Dr. Marchelle Gearing to give update. RR 30-40s HR 130s SpO2 100% Temp 102.6. No interventions ordered at this time.   1900  Report given to RN regarding above note.

## 2011-11-21 NOTE — Progress Notes (Signed)
Clinical Social Work Department BRIEF PSYCHOSOCIAL ASSESSMENT 11/21/2011  Patient:  [name], [name]     Account Number:  [id number]     Admit date:  11/14/2011  Clinical Social Worker:  Mee Hives  Date/Time:  11/21/2011 12:00 N  Referred by:  CSW  Date Referred:  11/20/2011 Referred for  Psychosocial assessment   Other Referral:   Interview type:  Family Other interview type:   Pt intubated and sedated at time of intervention    PSYCHOSOCIAL DATA Living Status:  OTHER Admitted from facility:   Level of care:   Primary support name:  Mary Primary support relationship to patient:  PARENT Degree of support available:   Pt living with boyfriend PTA, per pt mother    CURRENT CONCERNS Current Concerns  Substance Abuse  Other - See comment   Other Concerns:   Disposition will need to be addressed according to pt progression.  Pt mother expresses concern about pt ability to manage her diabetes.    SOCIAL WORK ASSESSMENT / PLAN CSW contacted pt mother by phone. Pt mother reports she is very concerned about pt, and has been for some time. Pt mother works night shift and is best contacted after 1600.  Pt mother reports pt was living in an apartment with her boyfriend PTA, and pt mother concerned that pt b/f not providing good care to pt.  Pt mother states she has encouraged pt to come live with her, so she could "take care" of pt, but pt has been unwilling to do so.  Per pt mother, pt has 2 adult daughters, one of whom just had a baby. Pt mother did not have contact information for them but stated she would try to locate them in the interest of giving them the opportunity to participate in healtchare decision making.  Pt mother states pt has had DM since age 46, and her older brother passed away due to related illness in recent years. Pt mother seemed very caring and concerned. CSW provided emotional support and validation of pt mother's concerns.   Assessment/plan status:   Psychosocial Support/Ongoing Assessment of Needs Other assessment/ plan:   D/c planning as appropriate to pt medical progression.   Information/referral to community resources:    PATIENT'S/FAMILY'S RESPONSE TO PLAN OF CARE: Pt mother receptive to intervention and very appreciative of CSW support.

## 2011-11-21 NOTE — Progress Notes (Signed)
Nutrition Follow-up  Patient was just extubated.  TF on hold for now.  Diet Order:  Glucerna 1.2 (via OG tube) at 60 ml/h providing 1728 kcals, 86 grams protein, 1159 ml free water daily--currently on hold.  Meds: Scheduled Meds:   . antiseptic oral rinse  15 mL Mouth Rinse QID  . aspirin  81 mg Per Tube Daily  . cefTRIAXone (ROCEPHIN)  IV  2 g Intravenous Q24H  . chlorhexidine  15 mL Mouth Rinse BID  . furosemide  20 mg Intravenous Q12H  . insulin aspart  0-15 Units Subcutaneous Q4H  . insulin glargine  30 Units Subcutaneous QHS  . latanoprost  1 drop Both Eyes QHS  . mulitivitamin  5 mL Oral Daily  . nystatin  5 mL Oral QID  . pantoprazole sodium  40 mg Per Tube Q1200  . potassium chloride  40 mEq Oral Once  . risperiDONE  2 mg Per Tube BID  . valproate sodium  250 mg Intravenous Q8H  . DISCONTD: insulin glargine  25 Units Subcutaneous QHS  . DISCONTD: potassium chloride  40 mEq Per Tube Once  . DISCONTD: valproate sodium  750 mg Intravenous Once   Continuous Infusions:   . dexmedetomidine (PRECEDEX) IV infusion for high rates 0.5 mcg/kg/hr (11/21/11 0700)  . feeding supplement (GLUCERNA 1.2 CAL) 1,000 mL (11/20/11 0953)   PRN Meds:.albuterol-ipratropium, LORazepam  Labs:  CMP     Component Value Date/Time   NA 143 11/21/2011 0500   K 3.6 11/21/2011 0500   CL 107 11/21/2011 0500   CO2 29 11/21/2011 0500   GLUCOSE 248* 11/21/2011 0500   BUN 14 11/21/2011 0500   CREATININE 0.68 11/21/2011 0500   CALCIUM 8.0* 11/21/2011 0500   PROT 6.1 11/21/2011 0500   ALBUMIN 2.0* 11/21/2011 0500   AST 20 11/21/2011 0500   ALT 33 11/21/2011 0500   ALKPHOS 106 11/21/2011 0500   BILITOT 0.1* 11/21/2011 0500   GFRNONAA >90 11/21/2011 0500   GFRAA >90 11/21/2011 0500   CBG (last 3)   Basename 11/21/11 1130 11/21/11 0750 11/21/11 0457  GLUCAP 226* 212* 208*     Intake/Output Summary (Last 24 hours) at 11/21/11 1228 Last data filed at 11/21/11 1000  Gross per 24 hour  Intake 2148.3 ml  Output   2265 ml    Net -116.7 ml    Weight Status:  72.6 kg (up from 56.2 kg 5/1) likely related to fluid retention.  Lasix ordered this morning.  Re-estimated needs:  1450-1650 kcals, 70-80 grams protein daily  Nutrition Dx:  Inadequate oral intake, ongoing.  Goal:  Intake to meet 90-100% of estimated nutrition needs, currently unmet with TF off.  Intervention/Monitor:    RD to follow for adequacy of oral intake as diet is advanced.  If unable to safely advance PO diet, consider placing enteral feeding tube and resume TF.   Hettie Holstein Pager #:  [phone]

## 2011-11-21 NOTE — Progress Notes (Addendum)
Inpatient Diabetes Program Recommendations  AACE/ADA: New Consensus Statement on Inpatient Glycemic Control  Target Ranges:  Prepandial:   less than 140 mg/dL      Peak postprandial:   less than 180 mg/dL (1-2 hours)      Critically ill patients:  140 - 180 mg/dL  Pager:  [phone] Hours:  8 am-10pm   Reason for Visit: Elevated glucose:  Results for [name], [name] (MRN [id number]) as of 11/21/2011 09:06  Ref. Range 11/20/2011 11:54 11/20/2011 15:40 11/20/2011 19:45 11/20/2011 23:47 11/21/2011 07:50  Glucose-Capillary Latest Range: 70-99 mg/dL 295 (H) 621 (H) 308 (H) 228 (H) 212 (H)    Inpatient Diabetes Program Recommendations Insulin - Meal Coverage: Add Novolog 4 units q4hr to cover CHOs in tube feeds.

## 2011-11-21 NOTE — Progress Notes (Signed)
Name: [name] MRN: [id number] DOB: [birth year]    LOS: 7  Active Problems:  DIABETES MELLITUS, TYPE I  HYPERLIPIDEMIA  Microcytic anemia  SUBSTANCE ABUSE, MULTIPLE  DKA, type 1  Encephalopathy acute  Acute renal failure  Acute respiratory failure  Coronary arteriosclerosis   PCCM PROGRESS NOTE  Brief patient summary: 46 yo smoker admitted on 4/30 with metabolic encephalopathy 2nd to hyperglycemia/hypernatremia/acidosis in setting of DM type I with non-compliance complicated by respiratory failure and shock.  Hx of Hypothyroidism, Bipolar, organic brain syndrome, Hyperlipidemia, CAD, Cocaine/ETOH abuse, HTN, Syphilis    Line/tube: Rt IJ CVL 4/30>> ETT 4/30>> OGT 4/30>> Lt radial Aline 4/30>>5/2  Cultures: MRSA screen 4/30>>negative Urine 4/30>> E. Coli (pansens) Blood 4/30>>NTD Sputum 4/30>>Staph Aureus (MSSA) CSF 5/3>>NTD  Antibiotics: Vancomycin 4/30>>5/4 Zosyn 4/30>>5/4 Ceftriaxone 5/4>>plan stop 5/7  Tests/events: 4/30 CT head>>negative 5/2 Seizure 5/2 EEG>>The spontaneously reactive delta and theta activities suggest a moderate encephalopathy of nonspecific etiology. 5/3 LP  Interval history: agitated  Vital signs:  Blood pressure 163/74, pulse 69, temperature 98.3 F (36.8 C), temperature source Core (Comment), resp. rate 24, height 5\' 2"  (1.575 m), weight 72.6 kg (160 lb 0.9 oz), last menstrual period 11/01/2011, SpO2 100.00%. Wt Readings from Last 3 Encounters:  11/21/11 72.6 kg (160 lb 0.9 oz)  07/25/11 62.279 kg (137 lb 4.8 oz)  04/04/11 62.687 kg (138 lb 3.2 oz)   I/O last 3 completed shifts: In: 3552.6 [I.V.:1362.6; Other:60; NG/GT:2130] Out: 3850 [Urine:3550; Stool:300]  Physical Exam:  General - Comfortable after ativan this am  HEENT - ETT Cardiac - S1 S@ rrregular Chest -coarse crackles Abd - Soft, bowel sounds present Ext - 2 plus edema Neuro - Sedated, rass -2  Ventilator Settings: Vent Mode:  [-] PRVC FiO2 (%):  [30 %] 30  % Set Rate:  [14 bmp] 14 bmp Vt Set:  [400 mL] 400 mL PEEP:  [5 cmH20] 5 cmH20 Plateau Pressure:  [28 cmH20-33 cmH20] 28 cmH20  ASSESSMENT AND PLAN  NEUROLOGIC A:  Acute encephalopathy.  Requires high rates of Fentanyl / Versed for sedation. Suspected seizures on admission but non since and negative EEG.  Cocaine abuse (positive toxicology screen on admission).  History of syphilis. On Depakote / Risperdal preadmission. P: -->  tolerating Precedex gtt --> int benzo --> re added home Risperdal 2 q12h --> consider re add depakote home dose -->  Daily WUA  PULMONARY  Lab 11/19/11 0445 11/17/11 0331 11/16/11 0356 11/15/11 1201 11/15/11 0454  PHART 7.425* 7.294* 7.272* 7.348* 7.559*  PCO2ART 31.3* 43.9 42.2 37.9 13.2*  PO2ART 93.0 40.0* 144.0* 150.0* 177.0*  HCO3 20.7 21.2 19.2* 20.8 11.8*  O2SAT 98.0 68.0 99.0 99.0 100.0   A:  Acute respiratory failure secondary to inability to protect airway and suspected aspiration pneumonia.  Mental status appears to be the limiting factor for extubation. P: -->  sbt this am , may need to tolerate RR elevation with TV preserved related to neurostatus -assess rsbi -re add home depakote pcxr c/w edema, edema all over = lasix -pcxr in am   CARDIOVASCULAR  Lab 11/15/11 1100 11/14/11 1714  LATICACIDVEN 1.6 7.7*   A:  History of CAD P: -->  ASA --> cosnider restart statin, first start depakote  RENAL  Lab 11/21/11 0500 11/20/11 0430 11/19/11 0428 11/18/11 0504 11/17/11 0458 11/16/11 1700 11/16/11 0440 11/15/11 1100  NA 143 146* 144 141 143 -- -- --  K 3.6 3.4* -- -- -- -- -- --  CL 107 113* 114* 110  112 -- -- --  CO2 29 27 23 20 23  -- -- --  GLUCOSE 248* 241* 224* 333* 170* -- -- --  BUN 14 15 23 20 16  -- -- --  CREATININE 0.68 0.84 1.21* 1.23* 1.30* -- -- --  CALCIUM 8.0* 7.5* 6.7* 6.7* 6.5* -- -- --  MG -- -- -- -- -- 1.4* 1.6 2.0  PHOS -- -- -- -- -- 4.1 1.0* 0.6*   A:  Acute renal failure secondary to shock / dehydration,  improving. P: --> kvo Lasix to neg 1 lit goal k supp  GASTROINTESTINAL  Lab 11/21/11 0500 11/18/11 0504 11/16/11 0440 11/14/11 1827  AST 20 99* 320* 19  ALT 33 85* 114* 10  ALKPHOS 106 125* 90 88  BILITOT 0.1* 0.3 0.2* 0.1*  PROT 6.1 6.7 6.0 6.9  ALBUMIN 2.0* 2.6* 2.5* 3.2*   A:  Malnutrition. Shocked liver, improving. P: -->  TF may need to hold for weaning -->  ppi  HEMATOLOGIC  Lab 11/21/11 0500 11/20/11 0430 11/19/11 0428 11/18/11 0504 11/17/11 0458  HCT 23.8* 24.0* 24.1* 26.4* 25.1*  INR -- -- -- -- --  APTT -- -- -- -- --   A:  Chronic anemia, stable. P: -->  Limit phlebotomy -hope for some hemoconcetration May restart sub q hep soon, follow cbc in 48 hrs / plat count  INFECTIOUS  Lab 11/21/11 0500 11/20/11 0430 11/19/11 0428 11/18/11 1430 11/18/11 0504 11/17/11 0458 11/14/11 2205  WBC 6.1 6.7 8.4 -- 11.7* 10.7* --  PROCALCITON -- -- -- 1.32 -- -- 9.15   A:  Suspected pneumonia.  Improved WBC / PCT. P: -->  Continue Ceftriaxone through 5/9 --> afebrile  ENDOCRINE  Lab 11/21/11 0750 11/20/11 2347 11/20/11 1945 11/20/11 1540 11/20/11 1154  GLUCAP 212* 228* 208* 191* 239*   A:  DM.  Resolved DKA, HHS. P: -->  SSI/CBG -->  Lantus to 30   BEST PRACTICE / DISPOSITION -->  ICU status under PCCM -->  Full code -->  TF -->  Heparin [place] for DVT Px -->  Protonix IV for GI Px -->  Ventilator bundle -->  Family is not available  Care during the described time interval was provided by me and/or other providers on the critical care team. I have reviewed this patient's available data, including medical history, events of note, physical examination and test results as part of my evaluation.  The patient is critically ill with multiple organ systems failure and requires high complexity decision making for assessment and support, frequent evaluation and titration of therapies, application of advanced monitoring technologies and extensive interpretation of multiple  databases. Critical Care Time devoted to patient care services described in this note is 30 minutes.  Mcarthur Rossetti. Tyson Alias, MD, FACP Pgr: [phone] [facility]

## 2011-11-21 NOTE — Procedures (Signed)
Extubation Procedure Note  Patient Details:   Name: [name] DOB: [birth year] MRN: [id number]   Airway Documentation:  Airway 7.5 mm (Active)  Secured at (cm) 22 cm 11/21/2011  8:00 AM  Measured From Lips 11/21/2011  8:00 AM  Secured Location Left 11/20/2011  3:48 AM  Secured By [employer] 11/21/2011  8:00 AM  Tube Holder Repositioned Yes 11/21/2011  8:00 AM  Cuff Pressure (cm H2O) 22 cm H2O 11/18/2011  8:44 PM  Site Condition Dry 11/21/2011  8:00 AM    Evaluation  O2 sats: stable throughout Complications: No apparent complications Patient did tolerate procedure well. Bilateral Breath Sounds: Rhonchi Suctioning: Airway Yes, pt put on 4lpm [place] sats @ 96%, bp 134/66, bilateral bs with no stridor present, pt has strong cough and ability to speak  [facility], Calin Ellery 11/21/2011, 1:06 PM

## 2011-11-22 ENCOUNTER — Inpatient Hospital Stay (HOSPITAL_COMMUNITY): Payer: Medicaid Other

## 2011-11-22 LAB — BASIC METABOLIC PANEL
BUN: 9 mg/dL (ref 6–23)
CO2: 33 mEq/L — ABNORMAL HIGH (ref 19–32)
Chloride: 102 mEq/L (ref 96–112)
Glucose, Bld: 121 mg/dL — ABNORMAL HIGH (ref 70–99)
Potassium: 3.6 mEq/L (ref 3.5–5.1)
Sodium: 142 mEq/L (ref 135–145)

## 2011-11-22 LAB — POCT I-STAT 3, ART BLOOD GAS (G3+)
Acid-Base Excess: 10 mmol/L — ABNORMAL HIGH (ref 0.0–2.0)
Bicarbonate: 33.2 mEq/L — ABNORMAL HIGH (ref 20.0–24.0)
Patient temperature: 101.2
pH, Arterial: 7.534 — ABNORMAL HIGH (ref 7.350–7.400)

## 2011-11-22 LAB — DIFFERENTIAL
Basophils Absolute: 0 10*3/uL (ref 0.0–0.1)
Eosinophils Relative: 0 % (ref 0–5)
Lymphocytes Relative: 20 % (ref 12–46)
Lymphs Abs: 1.3 10*3/uL (ref 0.7–4.0)
Monocytes Absolute: 1.3 10*3/uL — ABNORMAL HIGH (ref 0.1–1.0)
Monocytes Relative: 19 % — ABNORMAL HIGH (ref 3–12)
Neutro Abs: 4 10*3/uL (ref 1.7–7.7)

## 2011-11-22 LAB — GLUCOSE, CAPILLARY
Glucose-Capillary: 166 mg/dL — ABNORMAL HIGH (ref 70–99)
Glucose-Capillary: 35 mg/dL — CL (ref 70–99)
Glucose-Capillary: 72 mg/dL (ref 70–99)
Glucose-Capillary: 86 mg/dL (ref 70–99)

## 2011-11-22 LAB — URINALYSIS, ROUTINE W REFLEX MICROSCOPIC
Bilirubin Urine: NEGATIVE
Hgb urine dipstick: NEGATIVE
Ketones, ur: NEGATIVE mg/dL
Specific Gravity, Urine: 1.005 (ref 1.005–1.030)
Urobilinogen, UA: 0.2 mg/dL (ref 0.0–1.0)
pH: 8 (ref 5.0–8.0)

## 2011-11-22 MED ORDER — DEXTROSE 50 % IV SOLN
INTRAVENOUS | Status: AC
  Administered 2011-11-22: 50 mL via INTRAVENOUS
  Filled 2011-11-22: qty 50

## 2011-11-22 MED ORDER — POTASSIUM CHLORIDE 10 MEQ/50ML IV SOLN
INTRAVENOUS | Status: AC
  Administered 2011-11-22: 10 meq
  Filled 2011-11-22: qty 50

## 2011-11-22 MED ORDER — MAGNESIUM SULFATE 50 % IJ SOLN
2.0000 g | Freq: Once | INTRAVENOUS | Status: AC
  Administered 2011-11-22: 2 g via INTRAVENOUS
  Filled 2011-11-22: qty 4

## 2011-11-22 MED ORDER — INSULIN GLARGINE 100 UNIT/ML ~~LOC~~ SOLN
8.0000 [IU] | Freq: Every day | SUBCUTANEOUS | Status: DC
  Administered 2011-11-22: 8 [IU] via SUBCUTANEOUS
  Administered 2011-11-23: 22:00:00 via SUBCUTANEOUS
  Administered 2011-11-24 – 2011-11-26 (×2): 8 [IU] via SUBCUTANEOUS

## 2011-11-22 MED ORDER — DEXTROSE 10 % IV SOLN
INTRAVENOUS | Status: DC
  Administered 2011-11-22: 17:00:00 via INTRAVENOUS

## 2011-11-22 MED ORDER — INSULIN LISPRO 100 UNIT/ML ~~LOC~~ SOLN: 8.0000 [IU] | Freq: Three times a day (TID) | SUBCUTANEOUS | Status: DC

## 2011-11-22 MED ORDER — POTASSIUM CHLORIDE 10 MEQ/50ML IV SOLN
10.0000 meq | INTRAVENOUS | Status: AC
  Administered 2011-11-22: 10 meq via INTRAVENOUS
  Filled 2011-11-22: qty 50

## 2011-11-22 MED ORDER — DEXTROSE 50 % IV SOLN
50.0000 mL | Freq: Once | INTRAVENOUS | Status: AC | PRN
  Administered 2011-11-22: 50 mL via INTRAVENOUS

## 2011-11-22 MED ORDER — FUROSEMIDE 10 MG/ML IJ SOLN
20.0000 mg | Freq: Every day | INTRAMUSCULAR | Status: DC
  Filled 2011-11-22: qty 2

## 2011-11-22 MED ORDER — SODIUM CHLORIDE 0.9 % IJ SOLN
INTRAMUSCULAR | Status: AC
  Filled 2011-11-22: qty 10

## 2011-11-22 MED ORDER — SODIUM CHLORIDE 0.9 % IV SOLN
INTRAVENOUS | Status: DC
  Administered 2011-11-22: 02:00:00 via INTRAVENOUS

## 2011-11-22 MED ORDER — RISPERIDONE 0.5 MG PO TABS
0.5000 mg | ORAL_TABLET | Freq: Two times a day (BID) | ORAL | Status: DC
  Administered 2011-11-23 – 2011-11-28 (×10): 0.5 mg
  Filled 2011-11-22 (×14): qty 1

## 2011-11-22 MED ORDER — HEPARIN SODIUM (PORCINE) 5000 UNIT/ML IJ SOLN
5000.0000 [IU] | Freq: Three times a day (TID) | INTRAMUSCULAR | Status: DC
  Administered 2011-11-22 – 2011-11-28 (×16): 5000 [IU] via SUBCUTANEOUS
  Filled 2011-11-22 (×21): qty 1

## 2011-11-22 NOTE — Progress Notes (Signed)
Name: [name] MRN: [id number] DOB: 21-Mar-1966    LOS: 8  Active Problems:  DIABETES MELLITUS, TYPE I  HYPERLIPIDEMIA  Microcytic anemia  SUBSTANCE ABUSE, MULTIPLE  DKA, type 1  Encephalopathy acute  Acute renal failure  Acute respiratory failure  Coronary arteriosclerosis   PCCM PROGRESS NOTE  Brief patient summary: 46 yo smoker admitted on 4/30 with metabolic encephalopathy 2nd to hyperglycemia/hypernatremia/acidosis in setting of DM type I with non-compliance complicated by respiratory failure and shock.  Hx of Hypothyroidism, Bipolar, organic brain syndrome, Hyperlipidemia, CAD, Cocaine/ETOH abuse, HTN, Syphilis    Line/tube: Rt IJ CVL 4/30>> ETT 4/30>>5/7 OGT 4/30>>5/7 Lt radial Aline 4/30>>5/2  Cultures: MRSA screen 4/30>>negative Urine 4/30>> E. Coli (pansens) Blood 4/30>>NTD Sputum 4/30>>Staph Aureus (MSSA) CSF 5/3>>NTD  Antibiotics: Vancomycin 4/30>>5/4 Zosyn 4/30>>5/4 Ceftriaxone 5/4>>plan stop 5/7  Tests/events: 4/30 CT head>>negative 5/2 Seizure 5/2 EEG>>The spontaneously reactive delta and theta activities suggest a moderate encephalopathy of nonspecific etiology. 5/3 LP 5/7- extubated  Interval history: extubated  Vital signs:  Blood pressure 117/40, pulse 111, temperature 101.2 F (38.4 C), temperature source Core (Comment), resp. rate 42, height 5\' 2"  (1.575 m), weight 65.1 kg (143 lb 8.3 oz), last menstrual period 11/01/2011, SpO2 97.00%. Wt Readings from Last 3 Encounters:  11/22/11 65.1 kg (143 lb 8.3 oz)  07/25/11 62.279 kg (137 lb 4.8 oz)  04/04/11 62.687 kg (138 lb 3.2 oz)   I/O last 3 completed shifts: In: 2283.6 [I.V.:1002.1; NG/GT:1020; IV Piggyback:261.5] Out: 8140 [Urine:8040; Stool:100]  Physical Exam:  General - awakes, rr 35, no distress otherwise, on RA HEENT - ETT Cardiac - S1 S@ rrregular Chest -coarse crackles reolved Abd - Soft, bowel sounds present Ext - 1 plus edema Neuro - Sedated, rass -2  Ventilator  Settings: Vent Mode:  [-]  FiO2 (%):  [30 %] 30 %  ASSESSMENT AND PLAN  NEUROLOGIC A:  Acute encephalopathy.  Requires high rates of Fentanyl / Versed for sedation. Suspected seizures on admission but non since and negative EEG.  Cocaine abuse (positive toxicology screen on admission).  History of syphilis. On Depakote / Risperdal preadmission. P: -->  Lethargic, drop rispirdal  0.5 --> dep per pharm PT/OT To chair  PULMONARY  Lab 11/22/11 0502 11/21/11 2036 11/21/11 1737 11/19/11 0445 11/17/11 0331  PHART 7.534* 7.530* 7.517* 7.425* 7.294*  PCO2ART 39.8 38.5 38.9 31.3* 43.9  PO2ART 59.0* 69.0* 60.0* 93.0 40.0*  HCO3 33.2* 31.8* 31.6* 20.7 21.2  O2SAT 91.0 94.0 93.0 98.0 68.0   A:  Acute respiratory failure secondary to inability to protect airway and suspected aspiration pneumonia.  Mental status appears to be the limiting factor for extubation. P: --> rr neuro driven likely Lasix to qday IS  CARDIOVASCULAR No results found for this basename: LATICACIDVEN:5, O2SATVEN:5 in the last 168 hours A:  History of CAD P: -->  ASA --> lft in am on dep, no statin for now , not to confuse  RENAL  Lab 11/22/11 0517 11/21/11 0500 11/20/11 0430 11/19/11 0428 11/18/11 0504 11/16/11 1700 11/16/11 0440  NA 142 143 146* 144 141 -- --  K 3.6 3.6 -- -- -- -- --  CL 102 107 113* 114* 110 -- --  CO2 33* 29 27 23 20  -- --  GLUCOSE 121* 248* 241* 224* 333* -- --  BUN 9 14 15 23 20  -- --  CREATININE 0.85 0.68 0.84 1.21* 1.23* -- --  CALCIUM 8.2* 8.0* 7.5* 6.7* 6.7* -- --  MG 1.8 -- -- -- -- 1.4* 1.6  PHOS 4.7* -- -- -- -- 4.1 1.0*   A:  Acute renal failure secondary to shock / dehydration, improving. P: --> kvo Lasixto once daily k supp, mag  GASTROINTESTINAL  Lab 11/21/11 0500 11/18/11 0504 11/16/11 0440  AST 20 99* 320*  ALT 33 85* 114*  ALKPHOS 106 125* 90  BILITOT 0.1* 0.3 0.2*  PROT 6.1 6.7 6.0  ALBUMIN 2.0* 2.6* 2.5*   A:  Malnutrition. Shocked liver,  improving. P: Attempt swallow , may need slp -->  ppi  HEMATOLOGIC  Lab 11/21/11 0500 11/20/11 0430 11/19/11 0428 11/18/11 0504 11/17/11 0458  HCT 23.8* 24.0* 24.1* 26.4* 25.1*  INR -- -- -- -- --  APTT -- -- -- -- --   A:  Chronic anemia, stable. P: -->  Limit phlebotomy -hope for some hemoconcetration of hct' --> plat 184, restart sub q hep  INFECTIOUS  Lab 11/21/11 0500 11/20/11 0430 11/19/11 0428 11/18/11 1430 11/18/11 0504 11/17/11 0458  WBC 6.1 6.7 8.4 -- 11.7* 10.7*  PROCALCITON -- -- -- 1.32 -- --   A:  Suspected pneumonia.  Improved WBC / PCT. P: -->  Continue Ceftriaxone through 5/9 --> fever 5/7, dc line when able UA  ENDOCRINE  Lab 11/22/11 0753 11/22/11 0412 11/22/11 0348 11/22/11 0345 11/21/11 2343  GLUCAP 72 166* 35* 36* 145*   A:  DM.  Resolved DKA, HHS. P: -->  SSI/CBG -->  Lantus to 30, diet when able  BEST PRACTICE / DISPOSITION -->  ICU status under PCCM -->  Full code -->  TF -->  Heparin [place] for DVT Px -->  Protonix IV for GI Px  Mcarthur Rossetti. Tyson Alias, MD, FACP Pgr: [phone] [facility]

## 2011-11-22 NOTE — Progress Notes (Signed)
Inpatient Diabetes Program Recommendations  AACE/ADA: New Consensus Statement on Inpatient Glycemic Control (2009)  Target Ranges:  Prepandial:   less than 140 mg/dL      Peak postprandial:   less than 180 mg/dL (1-2 hours)      Critically ill patients:  140 - 180 mg/dL   Reason for Visit:Results for [name], [name] (MRN [id number]) as of 11/22/2011 13:48  Ref. Range 11/22/2011 03:45 11/22/2011 03:48 11/22/2011 04:12 11/22/2011 07:53 11/22/2011 12:24  Glucose-Capillary Latest Range: 70-99 mg/dL 36 (LL) 35 (LL) 409 (H) 72 73     Inpatient Diabetes Program Recommendations Insulin - Basal: Decrease Lantus to 15 units daily. Insulin - Meal Coverage: . Note patient extubated and tube feeds stopped.  Talked with RN. Consider adding D10 since patient not eating yet and CBG's less than 80 mg/dL.  Will need to decrease Lantus to 15 units q HS. Will follow.

## 2011-11-22 NOTE — Progress Notes (Signed)
eLink Physician-Brief Progress Note Patient Name: [name] DOB: 1966-07-15 MRN: [id number]  Date of Service  11/22/2011   HPI/Events of Note  Pharmacist calling   - 2 lantus orders present  - also a lispro order but patient not eating and a sliding scale order   eICU Interventions  Dc lantus 30 unit order Keep lantus 8 unit order Dc lispro due to not eating Keep ssi   Intervention Category Intermediate Interventions: Other:  Burley Kopka 11/22/2011, 6:51 PM

## 2011-11-23 DIAGNOSIS — F489 Nonpsychotic mental disorder, unspecified: Secondary | ICD-10-CM

## 2011-11-23 LAB — COMPREHENSIVE METABOLIC PANEL
ALT: 23 U/L (ref 0–35)
Alkaline Phosphatase: 85 U/L (ref 39–117)
BUN: 7 mg/dL (ref 6–23)
CO2: 29 mEq/L (ref 19–32)
GFR calc Af Amer: >90 mL/min (ref >90)
GFR calc non Af Amer: 81 mL/min — ABNORMAL LOW (ref >90)
Glucose, Bld: 145 mg/dL — ABNORMAL HIGH (ref 70–99)
Potassium: 3.6 mEq/L (ref 3.5–5.1)
Sodium: 138 mEq/L (ref 135–145)

## 2011-11-23 LAB — GLUCOSE, CAPILLARY
Glucose-Capillary: 127 mg/dL — ABNORMAL HIGH (ref 70–99)
Glucose-Capillary: 248 mg/dL — ABNORMAL HIGH (ref 70–99)

## 2011-11-23 MED ORDER — DIVALPROEX SODIUM ER 500 MG PO TB24
750.0000 mg | ORAL_TABLET | Freq: Every day | ORAL | Status: DC
  Administered 2011-11-24 – 2011-11-28 (×4): 750 mg via ORAL
  Filled 2011-11-23 (×7): qty 1

## 2011-11-23 MED ORDER — INSULIN ASPART 100 UNIT/ML ~~LOC~~ SOLN
0.0000 [IU] | Freq: Three times a day (TID) | SUBCUTANEOUS | Status: DC
  Administered 2011-11-23: 5 [IU] via SUBCUTANEOUS
  Administered 2011-11-24: 8 [IU] via SUBCUTANEOUS
  Administered 2011-11-24: 5 [IU] via SUBCUTANEOUS
  Administered 2011-11-24: 11 [IU] via SUBCUTANEOUS
  Administered 2011-11-25: 2 [IU] via SUBCUTANEOUS
  Administered 2011-11-25: 5 [IU] via SUBCUTANEOUS
  Administered 2011-11-25: 15 [IU] via SUBCUTANEOUS
  Administered 2011-11-26: 5 [IU] via SUBCUTANEOUS
  Administered 2011-11-26: 15 [IU] via SUBCUTANEOUS
  Administered 2011-11-27: 8 [IU] via SUBCUTANEOUS
  Administered 2011-11-27: 3 [IU] via SUBCUTANEOUS
  Administered 2011-11-27: 15 [IU] via SUBCUTANEOUS
  Administered 2011-11-28 (×2): 5 [IU] via SUBCUTANEOUS

## 2011-11-23 MED ORDER — SODIUM CHLORIDE 0.9 % IJ SOLN
INTRAMUSCULAR | Status: AC
  Filled 2011-11-23: qty 10

## 2011-11-23 MED ORDER — SODIUM CHLORIDE 0.9 % IJ SOLN
INTRAMUSCULAR | Status: AC
  Filled 2011-11-23: qty 30

## 2011-11-23 NOTE — Progress Notes (Addendum)
Name: [name] MRN: [id number] DOB: 09-23-1965    LOS: 9  Active Problems:  DIABETES MELLITUS, TYPE I  HYPERLIPIDEMIA  Microcytic anemia  SUBSTANCE ABUSE, MULTIPLE  DKA, type 1  Encephalopathy acute  Acute renal failure  Acute respiratory failure  Coronary arteriosclerosis   PCCM PROGRESS NOTE  Brief patient summary: 46 yo smoker admitted on 4/30 with metabolic encephalopathy 2nd to hyperglycemia/hypernatremia/acidosis in setting of DM type I with non-compliance complicated by respiratory failure and shock.  Hx of Hypothyroidism, Bipolar, organic brain syndrome, Hyperlipidemia, CAD, Cocaine/ETOH abuse, HTN, Syphilis    Line/tube: Rt IJ CVL 4/30>> ETT 4/30>>5/7 OGT 4/30>>5/7 Lt radial Aline 4/30>>5/2  Cultures: MRSA screen 4/30>>negative Urine 4/30>> E. Coli (pansens) Blood 4/30>>NTD Sputum 4/30>>Staph Aureus (MSSA) CSF 5/3>>NTD  Antibiotics: Vancomycin 4/30>>5/4 Zosyn 4/30>>5/4 Ceftriaxone 5/4>> 5/8  Tests/events: 4/30 CT head>>negative 5/2 Seizure 5/2 EEG>>The spontaneously reactive delta and theta activities suggest a moderate encephalopathy of nonspecific etiology. 5/3 LP 5/7- extubated  Interval history:  Confused, occasionally combative, refusing meds and labs  Vital signs:  Blood pressure 135/56, pulse 114, temperature 99.4 F (37.4 C), temperature source Oral, resp. rate 33, height 5\' 2"  (1.575 m), weight 63.5 kg (139 lb 15.9 oz), last menstrual period 11/01/2011, SpO2 99.00%. Wt Readings from Last 3 Encounters:  11/23/11 63.5 kg (139 lb 15.9 oz)  07/25/11 62.279 kg (137 lb 4.8 oz)  04/04/11 62.687 kg (138 lb 3.2 oz)   I/O last 3 completed shifts: In: 761.2 [I.V.:606.7; IV Piggyback:154.5] Out: 6375 [Urine:6375]  Physical Exam:  General - sleeping comfortably HEENT - OP clear Cardiac - S1 S@ rrregular Chest -coarse crackles reolved Abd - Soft, bowel sounds present Ext - 1 plus edema Neuro - confused, impulsive,      ASSESSMENT AND  PLAN  NEUROLOGIC A:  Acute metabolic + ? Chronic encephalopathy.  Cocaine abuse (positive toxicology screen on admission).  History of syphilis. On Depakote / Risperdal preadmission. Unclear whether she had seizures this admit.  P: -->  rispirdal  0.5 >>  --> change depakote to PO --> PT/OT, To chair  PULMONARY A:  Acute respiratory failure secondary to inability to protect airway and suspected aspiration pneumonia.  Resolved P:   CARDIOVASCULAR No results found for this basename: LATICACIDVEN:5, O2SATVEN:5 in the last 168 hours A:  History of CAD P: -->  ASA --> lft in am on depakote, no statin for now  RENAL  Lab 11/23/11 0508 11/22/11 0517 11/21/11 0500 11/20/11 0430 11/19/11 0428 11/16/11 1700  NA 138 142 143 146* 144 --  K 3.6 3.6 -- -- -- --  CL 102 102 107 113* 114* --  CO2 29 33* 29 27 23  --  GLUCOSE 145* 121* 248* 241* 224* --  BUN 7 9 14 15 23  --  CREATININE 0.84 0.85 0.68 0.84 1.21* --  CALCIUM 8.0* 8.2* 8.0* 7.5* 6.7* --  MG -- 1.8 -- -- -- 1.4*  PHOS -- 4.7* -- -- -- 4.1   A:  Acute renal failure secondary to shock / dehydration, resolved P:  GASTROINTESTINAL  Lab 11/23/11 0508 11/21/11 0500 11/18/11 0504  AST 26 20 99*  ALT 23 33 85*  ALKPHOS 85 106 125*  BILITOT 0.1* 0.1* 0.3  PROT 6.2 6.1 6.7  ALBUMIN 2.2* 2.0* 2.6*   A:  Malnutrition. Shocked liver, resolved P: -->  ppi --> start PO diet 5/9  HEMATOLOGIC  Lab 11/21/11 0500 11/20/11 0430 11/19/11 0428 11/18/11 0504 11/17/11 0458  HCT 23.8* 24.0* 24.1* 26.4* 25.1*  INR -- -- -- -- --  APTT -- -- -- -- --   A:  Chronic anemia, stable. P: --> recheck CBC 5/10  INFECTIOUS  Lab 11/21/11 0500 11/20/11 0430 11/19/11 0428 11/18/11 1430 11/18/11 0504 11/17/11 0458  WBC 6.1 6.7 8.4 -- 11.7* 10.7*  PROCALCITON -- -- -- 1.32 -- --   A:  Suspected pneumonia.  Improved WBC / PCT. P: -->  Continue Ceftriaxone through 5/8  ENDOCRINE  Lab 11/23/11 0739 11/23/11 0449 11/22/11 2348 11/22/11  2141 11/22/11 2000  GLUCAP 127* 146* 122* 153* 177*   A:  DM.  Resolved DKA, HHS. P: -->  SSI/CBG -->  Lantus was changed to 8u last night 5/8, diet when able  BEST PRACTICE / DISPOSITION -->  SDU status under PCCM, will transfer to [facility] 1  -->  Full code -->  Carb modified diet -->  Heparin [place] for DVT Px -->  Protonix IV for GI Px  Levy Pupa, MD, PhD 11/23/2011, 12:21 PM [facility] [phone] or if no answer [phone]

## 2011-11-23 NOTE — Progress Notes (Signed)
Called ELink nurse because of concerns with patient.  Her HR 120's, RR 40's, and Temp 100.1 F.  ELink nurse said she would watch patient closely and let ELink doctor know my concerns.  Was told to watch patient closely for now and call back if there are any changes.   Vivi Martens RN

## 2011-11-23 NOTE — Progress Notes (Signed)
Pt resting in bed.  Alert, confused and answering questions but is uncooperative.  Pt refuses am assessment, BP check, and any meds.  Increase in agitation noted with these interventions attempted.  Pt attempts to get OOB.  Reoriented pt to place, time, and situation.  Call bell within reach, door open, and bed alarm on.

## 2011-11-23 NOTE — Progress Notes (Signed)
Pt more cooperative at this time.  IV placed and VSS.

## 2011-11-23 NOTE — Evaluation (Signed)
Physical Therapy Evaluation Patient Details Name: [name] MRN: [id number] DOB: [birth year] Today's Date: 11/23/2011 Time: 1191-4782 PT Time Calculation (min): 19 min  PT Assessment / Plan / Recommendation Clinical Impression  Pt presents with a medical diagnosis of DKA with generalized weakness and functional mobility deficits. Pt will benefit from skilled PT in the acute care setting in order to maximize functional mobility for a safe d/c home    PT Assessment  Patient needs continued PT services    Follow Up Recommendations  Home health PT;Supervision/Assistance - 24 hour       lEquipment Recommendations  Other (comment) (TBD)       Frequency Min 3X/week    Precautions / Restrictions Precautions Precautions: Fall Restrictions Weight Bearing Restrictions: No         Mobility  Bed Mobility Bed Mobility: Supine to Sit;Sitting - Scoot to Edge of Bed Supine to Sit: 3: Mod assist;With rails;HOB elevated Sitting - Scoot to Edge of Bed: 3: Mod assist Details for Bed Mobility Assistance: VC for sequencing throughout. Assist through trunk and pelvis as well as max cueing for motivation Transfers Transfers: Sit to Stand;Stand Pivot Transfers;Stand to Sit Sit to Stand: 3: Mod assist;With upper extremity assist;From bed Stand to Sit: 3: Mod assist;With upper extremity assist;To chair/3-in-1 Stand Pivot Transfers: 1: +2 Total assist;With armrests Stand Pivot Transfers: Patient Percentage: 50% Details for Transfer Assistance: VC for sequencing and hand placement as well as safety from transfer to bed.         PT Diagnosis: Difficulty walking;Generalized weakness  PT Problem List: Decreased strength;Decreased activity tolerance;Decreased balance;Decreased mobility;Decreased knowledge of use of DME;Decreased safety awareness;Decreased knowledge of precautions;Pain PT Treatment Interventions: DME instruction;Gait training;Stair training;Functional mobility training;Therapeutic  activities;Balance training;Patient/family education   PT Goals Acute Rehab PT Goals PT Goal Formulation: With patient Time For Goal Achievement: 12/07/11 Potential to Achieve Goals: Fair Pt will go Supine/Side to Sit: with modified independence PT Goal: Supine/Side to Sit - Progress: Goal set today Pt will go Sit to Supine/Side: with modified independence PT Goal: Sit to Supine/Side - Progress: Goal set today Pt will go Sit to Stand: with modified independence PT Goal: Sit to Stand - Progress: Goal set today Pt will go Stand to Sit: with modified independence PT Goal: Stand to Sit - Progress: Goal set today Pt will Transfer Bed to Chair/Chair to Bed: with supervision PT Transfer Goal: Bed to Chair/Chair to Bed - Progress: Goal set today Pt will Ambulate: 51 - 150 feet;with supervision;with least restrictive assistive device PT Goal: Ambulate - Progress: Goal set today Pt will Go Up / Down Stairs: 1-2 stairs;with rail(s);with min assist PT Goal: Up/Down Stairs - Progress: Goal set today  Visit Information  Last PT Received On: 11/23/11 Assistance Needed: +2       Prior Functioning  Home Living Lives With: Daughter;Friend(s) Available Help at Discharge: Family;Available 24 hours/day Type of Home: Apartment Home Access: Stairs to enter Entrance Stairs-Number of Steps: 2 Entrance Stairs-Rails: Left Home Layout: Other (Comment) (1st floor) Bathroom Shower/Tub: Walk-in shower;Door [employer] Toilet: Pharmacist, community: Yes How Accessible: Accessible via walker Home Adaptive Equipment: None Prior Function Level of Independence: Independent Able to Take Stairs?: Yes Driving: Yes Vocation: On disability Dominant Hand: Right    Cognition  Overall Cognitive Status: Appears within functional limits for tasks assessed/performed Arousal/Alertness: Awake/alert Orientation Level: Appears intact for tasks assessed Behavior During Session: Restless    Extremity/Trunk  Assessment Right Lower Extremity Assessment RLE ROM/Strength/Tone: Deficits RLE ROM/Strength/Tone Deficits: Overall  strength >/= 3+/5 RLE Sensation: WFL - Light Touch Left Lower Extremity Assessment LLE ROM/Strength/Tone: Deficits LLE ROM/Strength/Tone Deficits: Overall strength >/= 3/5 LLE Sensation: WFL - Light Touch      End of Session PT - End of Session Equipment Utilized During Treatment: Gait belt Activity Tolerance: Patient limited by fatigue Patient left: in chair;with call bell/phone within reach;with chair alarm set Nurse Communication: Mobility status   Milana Kidney 11/23/2011, 5:05 PM  11/23/2011 Milana Kidney DPT PAGER: [phone] OFFICE: [phone]

## 2011-11-23 NOTE — Progress Notes (Signed)
Pt remains agitated and uncooperative.  Pt continues to refuse IV placement, meds, and BP check.  Intermittently attempts to get OOB.  Bed alarm on.

## 2011-11-24 LAB — BASIC METABOLIC PANEL
BUN: 16 mg/dL (ref 6–23)
Calcium: 8.7 mg/dL (ref 8.4–10.5)
GFR calc Af Amer: >90 mL/min (ref >90)
GFR calc non Af Amer: >90 mL/min (ref >90)
Potassium: 4.4 mEq/L (ref 3.5–5.1)
Sodium: 133 mEq/L — ABNORMAL LOW (ref 135–145)

## 2011-11-24 LAB — GLUCOSE, CAPILLARY
Glucose-Capillary: 301 mg/dL — ABNORMAL HIGH (ref 70–99)
Glucose-Capillary: 231 mg/dL — ABNORMAL HIGH (ref 70–99)

## 2011-11-24 LAB — CBC
MCH: 20.2 pg — ABNORMAL LOW (ref 26.0–34.0)
MCHC: 32 g/dL (ref 30.0–36.0)
RDW: 15.3 % (ref 11.5–15.5)

## 2011-11-24 MED ORDER — ADULT MULTIVITAMIN W/MINERALS CH
1.0000 | ORAL_TABLET | Freq: Every day | ORAL | Status: DC
  Administered 2011-11-24 – 2011-11-28 (×5): 1 via ORAL
  Filled 2011-11-24 (×5): qty 1

## 2011-11-24 MED ORDER — SODIUM CHLORIDE 0.9 % IV SOLN
INTRAVENOUS | Status: DC
  Administered 2011-11-24: 16:00:00 via INTRAVENOUS
  Administered 2011-11-25: 20 mL via INTRAVENOUS
  Administered 2011-11-28: 04:00:00 via INTRAVENOUS

## 2011-11-24 MED ORDER — PANTOPRAZOLE SODIUM 40 MG PO TBEC
40.0000 mg | DELAYED_RELEASE_TABLET | Freq: Every day | ORAL | Status: DC
  Administered 2011-11-24 – 2011-11-28 (×5): 40 mg via ORAL
  Filled 2011-11-24 (×5): qty 1

## 2011-11-24 MED ORDER — INSULIN ASPART 100 UNIT/ML ~~LOC~~ SOLN
3.0000 [IU] | Freq: Three times a day (TID) | SUBCUTANEOUS | Status: DC
  Administered 2011-11-24 – 2011-11-25 (×2): 3 [IU] via SUBCUTANEOUS

## 2011-11-24 NOTE — Progress Notes (Signed)
Clinical Social Worker met with pt at bedside.  CSW introduced self and explained role.  CSW reviewed support system; pt shared that she has one dtr named "[name]". CSW inquired about dc plans; pt would like to go home with dtr.  CSW phoned numbers listed in EPIC-they have been disconnected. CSW phoned pt's mother who is listed as a contact in chart.  Mother stated pt's dtrs make decisions for pt.  Their names are [name] and [name].  Mother did not have phone numbers for pt's dtrs.  CSW located one number in chart for [name] and left a message at this number.  CSW to continue to follow and assist as needed.   Angelia Mould, MSW, [place] [phone]

## 2011-11-24 NOTE — Consult Note (Signed)
PHARMACY CONSULT    Patient is currently receiving Depakote ER 750 mg po q hs..    Currently there are no new issues and no current needs for dosage adjustments.  Pharmacy will sign off.  If you require further assistance, please re-consult Pharmacy as needed.  Thank you.  Cylis Ayars, Elisha Headland, Pharm.D. 11/24/2011 10:24 AM

## 2011-11-24 NOTE — Progress Notes (Signed)
Inpatient Diabetes Program Recommendations  AACE/ADA: New Consensus Statement on Inpatient Glycemic Control (2009)  Target Ranges:  Prepandial:   less than 140 mg/dL      Peak postprandial:   less than 180 mg/dL (1-2 hours)      Critically ill patients:  140 - 180 mg/dL   Reason for Visit:  Results for REANNON, CANDELLA (MRN [id number]) as of 11/24/2011 12:35  Ref. Range 11/23/2011 15:42 11/23/2011 20:04 11/24/2011 00:21 11/24/2011 07:11 11/24/2011 07:27  Glucose-Capillary Latest Range: 70-99 mg/dL 409 (H) 811 (H) 914 (H)  301 (H)  Unsure whether patient got Lantus last PM?  Consider adding Novolog meal coverage 3 units tid with meals to cover CHO intake. Will follow.

## 2011-11-24 NOTE — Progress Notes (Signed)
11/23/2011 2000  Central line removed by patient during day shift. Completed flow sheet in EPIC. Pt has IV access with peripheral IV. Will continue to monitor.  [place] Daryl Beehler, RN

## 2011-11-25 LAB — GLUCOSE, CAPILLARY: Glucose-Capillary: 133 mg/dL — ABNORMAL HIGH (ref 70–99)

## 2011-11-25 LAB — BASIC METABOLIC PANEL
BUN: 15 mg/dL (ref 6–23)
CO2: 20 mEq/L (ref 19–32)
Calcium: 8.5 mg/dL (ref 8.4–10.5)
Chloride: 97 mEq/L (ref 96–112)
Creatinine, Ser: 0.76 mg/dL (ref 0.50–1.10)
GFR calc Af Amer: >90 mL/min (ref >90)
GFR calc non Af Amer: >90 mL/min (ref >90)
Glucose, Bld: 321 mg/dL — ABNORMAL HIGH (ref 70–99)
Potassium: 4.3 mEq/L (ref 3.5–5.1)
Sodium: 131 mEq/L — ABNORMAL LOW (ref 135–145)

## 2011-11-25 LAB — CBC
HCT: 29.5 % — ABNORMAL LOW (ref 36.0–46.0)
Hemoglobin: 9.3 g/dL — ABNORMAL LOW (ref 12.0–15.0)
MCH: 20.1 pg — ABNORMAL LOW (ref 26.0–34.0)
MCHC: 31.5 g/dL (ref 30.0–36.0)
MCV: 63.9 fL — ABNORMAL LOW (ref 78.0–100.0)
Platelets: 348 10*3/uL (ref 150–400)
RBC: 4.62 MIL/uL (ref 3.87–5.11)
RDW: 16 % — ABNORMAL HIGH (ref 11.5–15.5)
WBC: 10 10*3/uL (ref 4.0–10.5)

## 2011-11-25 LAB — HIV ANTIBODY (ROUTINE TESTING W REFLEX): HIV: NONREACTIVE

## 2011-11-25 MED ORDER — INSULIN ASPART 100 UNIT/ML ~~LOC~~ SOLN
6.0000 [IU] | Freq: Three times a day (TID) | SUBCUTANEOUS | Status: DC
Start: 2011-11-25 — End: 2011-11-27
  Administered 2011-11-26 (×2): 6 [IU] via SUBCUTANEOUS

## 2011-11-25 MED ORDER — INSULIN ASPART 100 UNIT/ML ~~LOC~~ SOLN: 8.0000 [IU] | Freq: Three times a day (TID) | SUBCUTANEOUS | Status: DC

## 2011-11-25 NOTE — Progress Notes (Signed)
83 woman admitted 4-30 with very severe DKA and hyper-osmolality (BG 1200, Na = 144, HCO3 < 5, pH = 7.0. Has been treated skillfully and is much better.  Sitting up, eating and speaking with sense (although with very gravely voice).  Latest VS and labs are normal or nearly so.  Plans underway for final convalescence and safe disposition. Cocaine and bi-polar illness are complicating issues.

## 2011-11-25 NOTE — Progress Notes (Signed)
Pt foley d/c 1245 pm per MD order, pt with no post void, bladder scan done pt noted to have greater than 286 ml, MD/N new order received for I and O cath. 500 cc urine out.
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
## 2011-11-26 LAB — GLUCOSE, CAPILLARY: Glucose-Capillary: 243 mg/dL — ABNORMAL HIGH (ref 70–99)

## 2011-11-26 NOTE — Progress Notes (Signed)
Pt transferred on unit during shift change. Pt placed on telemetry. Pt alert oriented x3. Ambulatory with assistance. Pt oriented to unit and safety plan. Bed alarm on pt with red socks yellow arm band in place. Will continue to monitor

## 2011-11-26 NOTE — Progress Notes (Signed)
Subjective: Patient feels well. No c/o. No acute event overnight.   Objective: Vital signs in last 24 hours: Filed Vitals:   11/25/11 1200 11/25/11 1530 11/25/11 1925 11/26/11 0545  BP: 113/68 137/69 156/67 139/78  Pulse:  101 117 119  Temp: 97.6 F (36.4 C) 98.4 F (36.9 C) 98.4 F (36.9 C) 98.4 F (36.9 C)  TempSrc: Oral Oral Oral Oral  Resp: 19 21 18 17   Height:      Weight:   132 lb 4.4 oz (60 kg)   SpO2: 100% 100% 100% 100%   Weight change: 1 lb 8.7 oz (0.7 kg)  Intake/Output Summary (Last 24 hours) at 11/26/11 0821 Last data filed at 11/26/11 0723  Gross per 24 hour  Intake   1000 ml  Output   1970 ml  Net   -970 ml   General: alert, cooperative, and in no apparent distress HEENT: pupils equal round and reactive to light, vision grossly intact, oropharynx clear and non-erythematous  Neck: supple, no lymphadenopathy Lungs: clear to ascultation bilaterally, normal work of respiration, no wheezes, rales, ronchi Heart: tachycardic, regular rhythm, no murmurs, gallops, or rubs Abdomen: soft, non-tender, non-distended, normal bowel sounds Msk: no joint edema, warmth, or erythema  Extremities: no cyanosis, clubbing, or edema Neurologic: alert & oriented X3, cranial nerves II-XII intact, strength grossly intact, sensation intact to light touch     Lab Results: Basic Metabolic Panel:  Lab 11/25/11 0981 11/24/11 0711 11/22/11 0517  [name] 131* 133* --  K 4.3 4.4 --  CL 97 97 --  CO2 20 21 --  GLUCOSE 321* 297* --  BUN 15 16 --  CREATININE 0.76 0.74 --  CALCIUM 8.5 8.7 --  MG -- -- 1.8  PHOS -- -- 4.7*   Liver Function Tests:  Lab 11/23/11 0508 11/21/11 0500  AST 26 20  ALT 23 33  ALKPHOS 85 106  BILITOT 0.1* 0.1*  PROT 6.2 6.1  ALBUMIN 2.2* 2.0*   CBC:  Lab 11/25/11 0445 11/24/11 0711 11/22/11 0517  WBC 10.0 7.7 --  NEUTROABS -- -- 4.0  HGB 9.3* 9.8* --  HCT 29.5* 30.6* --  MCV 63.9* 63.1* --  PLT 348 345 --   CBG:  Lab 11/25/11 2226 11/25/11 1735  11/25/11 1217 11/25/11 0810 11/24/11 2149 11/24/11 1645  GLUCAP 249* 133* 204* 388* 173* 270*   Urine Drug Screen: Drugs of Abuse     Component Value Date/Time   LABOPIA NONE DETECTED 11/14/2011 1723   COCAINSCRNUR POSITIVE* 11/14/2011 1723   LABBENZ NONE DETECTED 11/14/2011 1723   AMPHETMU NONE DETECTED 11/14/2011 1723   THCU NONE DETECTED 11/14/2011 1723   LABBARB NONE DETECTED 11/14/2011 1723    Urinalysis:  Lab 11/22/11 1131  COLORURINE STRAW*  LABSPEC 1.005  PHURINE 8.0  GLUCOSEU NEGATIVE  HGBUR NEGATIVE  BILIRUBINUR NEGATIVE  KETONESUR NEGATIVE  PROTEINUR NEGATIVE  UROBILINOGEN 0.2  NITRITE NEGATIVE  LEUKOCYTESUR NEGATIVE   Micro Results: Recent Results (from the past 240 hour(s))  BODY FLUID CULTURE     Status: Normal   Collection Time   11/17/11 10:30 AM      Component Value Range Status Comment   Specimen Description CSF   Final    Special Requests Normal   Final    Gram Stain     Final    Value: CYTOSPIN  WBC PRESENT, PREDOMINANTLY MONONUCLEAR     NO ORGANISMS SEEN     Performed at [facility]   Culture NO GROWTH 3 DAYS  Final    Report Status 11/20/2011 FINAL   Final   GRAM STAIN     Status: Normal   Collection Time   11/17/11 10:30 AM      Component Value Range Status Comment   Specimen Description CSF   Final    Special Requests NONE   Final    Gram Stain     Final    Value: CYTOSPIN SLIDE:     WBC PRESENT, PREDOMINANTLY MONONUCLEAR     NO ORGANISMS SEEN   Report Status 11/17/2011 FINAL   Final    Studies/Results: No results found. Medications: I have reviewed the patient's current medications. Scheduled Meds:   . acetaminophen  650 mg Rectal Once  . antiseptic oral rinse  15 mL Mouth Rinse q12n4p  . aspirin  81 mg Per Tube Daily  . chlorhexidine  15 mL Mouth Rinse BID  . divalproex  750 mg Oral QHS  . heparin subcutaneous  5,000 Units Subcutaneous Q8H  . insulin aspart  0-15 Units Subcutaneous TID WC  . insulin aspart  6 Units  Subcutaneous TID WC  . insulin glargine  8 Units Subcutaneous QHS  . latanoprost  1 drop Both Eyes QHS  . mulitivitamin with minerals  1 tablet Oral Daily  . nystatin  5 mL Oral QID  . pantoprazole  40 mg Oral Q1200  . risperiDONE  0.5 mg Per Tube BID  . DISCONTD: insulin aspart  3 Units Subcutaneous TID WC  . DISCONTD: insulin aspart  8 Units Subcutaneous TID WC   Continuous Infusions:   . sodium chloride 20 mL/hr at 11/25/11 1800   PRN Meds:. Assessment/Plan:  # Encephalopathy - resolved, likely secondary to DKA (patient had run out of her insulin prescription and was using expired bottles of insulin prior to admission, in addition to using cocaine) + septic shock (possible pneumonia) + cocaine use   -CBG's relatively well-controlled  -Lantus 8 qhs, novolog 6 with meals, SSI  -IVF at [facility]  -depakote  -pt working with PT/OT, has spent some time in the chair, walking with walker   # Pneumonia - suspected aspiration pneumonia, due to leukocytosis, fever to 102.6 (5/7), and LLL opacity seen on cxr 5/5, which developed while patient was intubated. Antibiotics have been discontinued (initially vanc/zosyn, then ceftriaxone), patient now afebrile since 5/8, leukocytosis has resolved.   # AKI - resolved, presented with Cr = 3.26 likely due to volume depletion vs ATN from shock. Now below previous baseline of 1.1.  -follow BMET's   # Shock liver - resolved, LFT's normalized  -follow LFT's   # Bipolar disorder - chronic, stable  -continue risperidone, depakote   # Nutritional status  -patient now tolerating regular diet   # Prophy - lovenox   # Dispo - continue to work towards PT/OT goals, continue to observe mental status. Plan for discharge home with [facility] PT/OT when stable, possibly 5/13. Appreciate excellent CSW assistance, pt plans to return to her apartment at discharge, where her daughter [name] and husband also live.   LOS: 12 days   [name] 11/26/2011, 8:21 AM

## 2011-11-27 ENCOUNTER — Other Ambulatory Visit: Payer: Self-pay | Admitting: Internal Medicine

## 2011-11-27 DIAGNOSIS — E119 Type 2 diabetes mellitus without complications: Secondary | ICD-10-CM

## 2011-11-27 LAB — BASIC METABOLIC PANEL
BUN: 17 mg/dL (ref 6–23)
CO2: 20 mEq/L (ref 19–32)
Chloride: 94 mEq/L — ABNORMAL LOW (ref 96–112)
Creatinine, Ser: 0.82 mg/dL (ref 0.50–1.10)
GFR calc Af Amer: >90 mL/min (ref >90)

## 2011-11-27 LAB — GLUCOSE, CAPILLARY
Glucose-Capillary: 440 mg/dL — ABNORMAL HIGH (ref 70–99)
Glucose-Capillary: 260 mg/dL — ABNORMAL HIGH (ref 70–99)

## 2011-11-27 LAB — COMPREHENSIVE METABOLIC PANEL
ALT: 35 U/L (ref 0–35)
AST: 39 U/L — ABNORMAL HIGH (ref 0–37)
Alkaline Phosphatase: 92 U/L (ref 39–117)
CO2: 15 mEq/L — ABNORMAL LOW (ref 19–32)
Chloride: 92 mEq/L — ABNORMAL LOW (ref 96–112)
Creatinine, Ser: 0.86 mg/dL (ref 0.50–1.10)
GFR calc non Af Amer: 79 mL/min — ABNORMAL LOW (ref >90)
Total Bilirubin: 0.2 mg/dL — ABNORMAL LOW (ref 0.3–1.2)

## 2011-11-27 MED ORDER — ACCU-CHEK MULTICLIX LANCETS MISC: Status: DC

## 2011-11-27 MED ORDER — INSULIN GLARGINE 100 UNIT/ML ~~LOC~~ SOLN
10.0000 [IU] | Freq: Every day | SUBCUTANEOUS | Status: DC
  Administered 2011-11-28: 10 [IU] via SUBCUTANEOUS

## 2011-11-27 MED ORDER — GLUCOSE BLOOD VI STRP: ORAL_STRIP | Status: DC

## 2011-11-27 MED ORDER — INSULIN ASPART 100 UNIT/ML ~~LOC~~ SOLN
0.0000 [IU] | Freq: Every day | SUBCUTANEOUS | Status: DC
  Administered 2011-11-28: 5 [IU] via SUBCUTANEOUS

## 2011-11-27 MED ORDER — INSULIN ASPART 100 UNIT/ML ~~LOC~~ SOLN
3.0000 [IU] | Freq: Three times a day (TID) | SUBCUTANEOUS | Status: DC
  Administered 2011-11-28 (×2): 3 [IU] via SUBCUTANEOUS

## 2011-11-27 MED ORDER — INSULIN PEN STARTER KIT
1.0000 | Freq: Once | Status: DC
  Filled 2011-11-27: qty 1

## 2011-11-27 NOTE — Progress Notes (Signed)
   CARE MANAGEMENT NOTE 11/27/2011  Patient:  [name], [name]   Account Number:  [id number]  Date Initiated:  11/15/2011  Documentation initiated by:  Avie Arenas  Subjective/Objective Assessment:   Unresponsive - hyperglycemic.     Action/Plan:   Anticipated DC Date:  11/20/2011   Anticipated DC Plan:  HOME W HOME HEALTH SERVICES      DC Planning Services  CM consult      Choice offered to / List presented to:             Status of service:  In process, will continue to follow Medicare Important Message given?   (If response is "NO", the following Medicare IM given date fields will be blank) Date Medicare IM given:   Date Additional Medicare IM given:    Discharge Disposition:    Per UR Regulation:  Reviewed for med. necessity/level of care/duration of stay  If discussed at Long Length of Stay Meetings, dates discussed:    Comments:   11/27/2011  [address] RN, [place] [phone] Spoke with PT Jeanice Lim regarding patient's PT recommendations for home PT/OT, RW and 3:1 commode and 24 hour supervision. Discussed the patient had declined DME, advised home PT not covered with her insurance. CM will update MD. CM talked with patient again regarding the DME and she agreed for RW and 3:1 commode.  Spoke with Dr Manson Passey regarding home PT/OT not covered and patient agreed to RW and 3:1 commode, the PT recommends 24 hour supervision and patient states she does have someone with her all the time. He will see the patient to tell her she will need to stay until tomorrow due to elevated glucose levels. He will call the daughter to verify the home supervision to determine discharge plan.  CM to continue to follow for discharge planning.  11/27/2011 [address] RN, [place] [phone] Met with patient to discuss discharge planning and home health needs. Her daughter lives with her. PCP: Dr Charm Barges she has appointment 2x/month for follow up.  Pharmacy: [facility] on [address]. She needs a new  blood sugar machine and script for insulin. Discussed possible home health PT. She declines PT stating she can get around and gets enough excercise. CM to follow up with PT regarding evaluation and discharge needs.

## 2011-11-27 NOTE — Progress Notes (Signed)
Pt with elevated cbg of 476 this am. MD, Dr.Brown, notified. OK to give scheduled meal coverage and 15 units of sliding scale. Pt refuses to eat this am. No meal coverage given. Will continue to monitor. Dondra Spry

## 2011-11-27 NOTE — Progress Notes (Signed)
Physical Therapy Treatment Patient Details Name: [name] MRN: [id number] DOB: July 03, 1966 Today's Date: 11/27/2011 Time: 7829-5621 PT Time Calculation (min): 24 min  PT Assessment / Plan / Recommendation Comments on Treatment Session  Ms. Link is at a significant risk of falls, and safety at home is a big concern; Most optimal scenario for dc planning will be Home with HHPT and 24 hour assistance available to pt; Can family come stay with pt or pt stay with family?  Home with PRN assist is acceptable, though not optimal, and the hope would be that pt is not alone at home for large portions of the day;   If at all possible, HHPT follow up in the home will be very good to get a better idea of how pt functions in her own environment, and to make home safety recommendations  While it is worth considering a post-acute rehab stay at SNF to maximize independence and safety with mobility prior to dc home, it is quite likely Ms. [place] will refuse SNF  Discussed above with Dr. Manson Passey, and Drinda Butts, Case Manager    Follow Up Recommendations  Home health PT;Supervision/Assistance - 24 hour (Will need to consider SNF; pt will likely refuse)    Barriers to Discharge        Equipment Recommendations  Rolling walker with 5" wheels;3 in 1 bedside comode    Recommendations for Other Services OT consult (Paged MD to obtain OT consult -- thanks!)  Frequency Min 3X/week   Plan Discharge plan needs to be updated    Precautions / Restrictions Precautions Precautions: Fall   Pertinent Vitals/Pain HR increased to 140 with stair training; RN and MD notified    Mobility  Bed Mobility Bed Mobility: Supine to Sit Supine to Sit: 4: Min guard Sitting - Scoot to Edge of Bed: 4: Min guard Details for Bed Mobility Assistance: Did not need physical assist for bed mobility; did use bedrails, though Transfers Transfers: Sit to Stand;Stand to Sit Sit to Stand: 4: Min guard;With upper extremity assist;From  bed Stand to Sit: 4: Min guard;With upper extremity assist;To chair/3-in-1 Details for Transfer Assistance: Cues for safety, and handplacement; cues to control descent with stand to sit Ambulation/Gait Ambulation/Gait Assistance: 4: Min guard;2: Max assist Ambulation Distance (Feet): 140 Feet Assistive device: Rolling walker;1 person hand held assist;None Ambulation/Gait Assistance Details: Intiated amb without assistive device or handheld assist and pt with 3 losses of balance requiring physical assist to prevent fall; Needed hard persuasion to use handheld assist and then RW; More steady with RW -- recommend it for amb Gait Pattern: Wide base of support;Decreased step length - right;Decreased step length - left Gait velocity: Decreased Stairs: Yes Stairs Assistance: 4: Min assist Stairs Assistance Details (indicate cue type and reason): Cues fr safety and control; Pt with significantly decreased control of descent Stair Management Technique: One rail Left;Forwards;Step to pattern Number of Stairs: 3     Exercises     PT Diagnosis:    PT Problem List:   PT Treatment Interventions:     PT Goals Acute Rehab PT Goals Time For Goal Achievement: 12/07/11 Potential to Achieve Goals: Fair Pt will go Supine/Side to Sit: with modified independence PT Goal: Supine/Side to Sit - Progress: Progressing toward goal Pt will go Sit to Stand: with modified independence PT Goal: Sit to Stand - Progress: Progressing toward goal Pt will go Stand to Sit: with modified independence PT Goal: Stand to Sit - Progress: Progressing toward goal Pt will Ambulate: 51 -  150 feet;with supervision;with least restrictive assistive device PT Goal: Ambulate - Progress: Progressing toward goal Pt will Go Up / Down Stairs: 1-2 stairs;with rail(s);with min assist PT Goal: Up/Down Stairs - Progress: Partly met  Visit Information  Last PT Received On: 11/27/11 Assistance Needed: +1    Subjective Data  Subjective:  Pt seems very excited at prospect of going home Patient Stated Goal: Home   Cognition  Overall Cognitive Status: Impaired Area of Impairment: Safety/judgement;Awareness of deficits Arousal/Alertness: Awake/alert Orientation Level: Appears intact for tasks assessed Behavior During Session: [facility] for tasks performed Safety/Judgement: Decreased awareness of need for assistance;Decreased safety judgement for tasks assessed;Impulsive Safety/Judgement - Other Comments: Pt originally telling this PT and Case Manager that she is altogether better and that she doesn't need a RW, or help at home Awareness of Deficits: Decreased awareness of fall risk    Balance     End of Session PT - End of Session Equipment Utilized During Treatment: Gait belt Activity Tolerance: Patient tolerated treatment well Patient left: in chair;with call bell/phone within reach;with chair alarm set Nurse Communication: Mobility status    Olen Pel [place],  [phone]  11/27/2011, 2:07 PM

## 2011-11-27 NOTE — Discharge Summary (Signed)
Internal Medicine Teaching [facility] Discharge Note  Name: [name] MRN: [id number] DOB: 1965/08/14 46 y.o.  Date of Admission: 11/14/2011  4:26 PM Date of Discharge: 11/28/2011 Attending Physician: Lina Sayre, MD  Discharge Diagnosis: 1. Acute Diabetic Ketoacidosis - requiring ICU admission and intubation 2. Hypernatremia - likely secondary to dehydration 3. Aspiration pneumonia 4. Acute kidney injury - resolved 5. Shock liver - resolved 6. Cocaine abuse 7. Deconditioning 8. Bipolar disorder 9. Hyperlipidemia 10. Hypertension  Discharge Medications: Medication List  As of 12/01/2011 10:15 AM   TAKE these medications         accu-chek multiclix lancets   Use as instructed      acetaminophen 500 MG tablet   Commonly known as: TYLENOL   Take 1 tablet (500 mg total) by mouth every 4 (four) hours as needed for pain.      aspirin 81 MG EC tablet   Take 81 mg by mouth daily.      divalproex 250 MG 24 hr tablet   Commonly known as: DEPAKOTE ER   Take 750 mg by mouth at bedtime.      glucose blood test strip   Use as instructed      insulin glargine 100 UNIT/ML injection   Commonly known as: LANTUS   Inject 8 Units into the skin at bedtime.      insulin lispro 100 UNIT/ML injection   Commonly known as: HUMALOG   Inject 8-15 Units into the skin 3 (three) times daily before meals. Inject 15 units before breakfast, 8 units before lunch, 8 units dinner      latanoprost 0.005 % ophthalmic solution   Commonly known as: XALATAN   Place 1 drop into both eyes at bedtime.      MIRALAX powder   Generic drug: polyethylene glycol powder   Take 17 g by mouth daily. Mix 17gm in 8 oz water and take by mouth daily at 5PM      pravastatin 40 MG tablet   Commonly known as: PRAVACHOL   Take 40 mg by mouth at bedtime.      risperiDONE 3 MG tablet   Commonly known as: RISPERDAL   Take 3 mg by mouth at bedtime.            Disposition and follow-up:   Nicole  [name] was discharged from [facility] in stable and improved condition, with resolution of DKA.  The patient will follow-up with Dr. Baltazar Apo on 5/21, to follow-up on the following issues addressed during hospitalization: 1. Ensure compliance with home insulin regimen (given difficulty remembering her insulin dosages) of lantus 8 qhs, and novolog 15-8-8 with meals. 2. Assess patient's progress towards PT goals, given unsteadiness with walking after her 2-week hospitalization 3. Reinforce dangers of continued cocaine use, and its contribution to DKA  Follow-up Appointments: Discharge Orders    Future Appointments: Provider: Department: Dept Phone: Center:   12/05/2011 11:15 AM Melida Quitter, MD [facility] [phone] [facility]     Future Orders Please Complete By Expires   Diet Carb Modified      Discharge instructions      Comments:   You were hospitalized for high blood sugar, a condition that can sometimes be deadly.  We are fortunate that you were able to recover fully, but it is VERY important to understand your insulin dosing, to prevent this from happening in the future.  Your insulin schedule is as follows: 1. Take Lantus (long-acting insulin), 8 units  every evening 2. Take Novolog (short-acting insulin), at these times:      15 units every morning after breakfast      8 units every day after lunch      8 units every day after dinner   Walker       Walk with assistance      Call MD for:  temperature >100.4      Call MD for:  persistant nausea and vomiting      Call MD for:  severe uncontrolled pain         Consultations: Critical Care  Procedures Performed:  Ct Head Wo Contrast  11/14/2011  *RADIOLOGY REPORT*  Clinical Data: Hyperglycemia, altered mental status  CT HEAD WITHOUT CONTRAST  Technique:  Contiguous axial images were obtained from the base of the skull through the vertex without contrast.  Comparison: 03/28/2010; 03/20/2010  Findings:  Normal  gray-white differentiation.  No CT evidence of acute large territory infarct.  Normal size and configuration of the ventricles and basilar cisterns.  No midline shift.  No intraparenchymal or extra-axial mass or hemorrhage.  Paranasal sinuses and mastoid air cells are normal. Post bilateral cataract surgery.  IMPRESSION: Negative noncontrast head CT.  Original Report Authenticated By: Waynard Reeds, M.D.   Dg Chest Port 1 View  11/22/2011  *RADIOLOGY REPORT*  Clinical Data: Shortness of breath.  Agitation.  PORTABLE CHEST - 1 VIEW  Comparison: 11/21/2011.  Findings: The patient has been extubated on 11/21/2011.  Low lung volumes are present without focal infiltrates.  Central venous catheter tip proximal to mid right atrium.  Normal cardiac size. No effusion or pneumothorax.  IMPRESSION: Low lung volumes, otherwise unremarkable post extubation film.  Original Report Authenticated By: Elsie Stain, M.D.   Dg Chest Port 1 View  11/21/2011  *RADIOLOGY REPORT*  Clinical Data: Respiratory failure  PORTABLE CHEST - 1 VIEW  Comparison: 11/21/2011  Findings: Endotracheal tube has been removed.  Right jugular catheter tip in the cavoatrial junction, unchanged.  NG tube removed.  Mild bibasilar airspace disease has improved from prior study.  No effusion or edema.  IMPRESSION: Improved aeration in the lung bases following extubation.  Original Report Authenticated By: Camelia Phenes, M.D.   Dg Chest Port 1 View  11/21/2011  *RADIOLOGY REPORT*  Clinical Data: Check endotracheal tube position  PORTABLE CHEST - 1 VIEW  Comparison: Chest radiograph 11/20/2011  Findings: Endotracheal tube, right central venous line, and NG tube are unchanged.  Stable cardiac silhouette.  There is mild basilar air space disease not significantly changed from prior.  No pneumothorax.  No osseous abnormality  IMPRESSION:  1.  Support apparatus unchanged. 2.  Stable mild bibasilar air space disease.  Original Report Authenticated By: Genevive Bi, M.D.   Dg Chest Portable 1 View  11/20/2011  *RADIOLOGY REPORT*  Clinical Data: Respiratory failure  PORTABLE CHEST - 1 VIEW  Comparison: Chest radiograph 11/19/2011  Findings: Endotracheal tube, NG tube, and right central venous line are unchanged.  Stable cardiac silhouette.  Air space disease in left lower lobe is again demonstrated and not changed.  Central venous congestion is present.  No pneumothorax.  IMPRESSION:  1.  Stable support apparatus.  No interval change. 2.  Left lower lobe air space disease.  Original Report Authenticated By: Genevive Bi, M.D.   Dg Chest Port 1 View  11/19/2011  *RADIOLOGY REPORT*  Clinical Data: Intubated  PORTABLE CHEST - 1 VIEW  Comparison:   the previous day's  study  Findings: The endotracheal tube, nasogastric tube, and right IJ central line are stable in position.  Interval worsening of patchy airspace opacities in the left mid and lower lung, and to a much milder degree at the right lung base.  Heart size remains normal. No effusion.  IMPRESSION:  1.  Worsening asymmetric airspace disease. 2. Support hardware stable in position.  Original Report Authenticated By: Osa Craver, M.D.   Dg Chest Port 1 View  11/18/2011  *RADIOLOGY REPORT*  Clinical Data: Atelectasis, respiratory failure  PORTABLE CHEST - 1 VIEW  Comparison:   the previous day's study  Findings: Endotracheal tube, nasogastric tube, and right IJ central line are stable in position.  Improved aeration of the right lung base.  No new infiltrate.  Heart size upper limits normal.  No effusion.  IMPRESSION:  1.  Improving right lower lobe aeration. 2. Support hardware stable in position.  Original Report Authenticated By: Osa Craver, M.D.   Dg Chest Port 1 View  11/17/2011  *RADIOLOGY REPORT*  Clinical Data: Respiratory failure  PORTABLE CHEST - 1 VIEW  Comparison: Chest radiograph 11/16/2011  Findings: Endotracheal tube and right central venous line are unchanged.  There is  increased atelectasis at the right lung base. No overt pulmonary edema.  No pleural fluid or pneumothorax.  IMPRESSION:  1.  Stable support apparatus.  2.   Increased atelectasis at the right lung base.  Original Report Authenticated By: Genevive Bi, M.D.   Dg Chest Port 1 View  11/16/2011  *RADIOLOGY REPORT*  Clinical Data: Respiratory failure  PORTABLE CHEST - 1 VIEW  Comparison: 11/14/2011  Findings: Endotracheal tube at the thoracic inlet.  Stable right IJ venous catheter and enteric tube coursing below the diaphragm.  Lungs are essentially clear.  No pleural effusion or pneumothorax.  Cardiomediastinal silhouette is within normal limits.  IMPRESSION: Endotracheal tube at the thoracic inlet.  Stable support apparatus.  Original Report Authenticated By: Charline Bills, M.D.   Dg Chest Port 1 View  11/14/2011  *RADIOLOGY REPORT*  Clinical Data: 46 year old female intubated, tube adjustment.  PORTABLE CHEST - 1 VIEW  Comparison: 2015 hours the same day and earlier.  Findings: AP portable semi upright view 2025 hours. Endotracheal tube tip in good position between the level of the clavicles and carina.  Enteric tube courses to the abdomen, tip not included.  Stable right IJ central line tip, likely at the right atrium level. No pneumothorax.  Stable lung volumes.  No pulmonary edema, pleural effusion or confluent opacity.  IMPRESSION: 1.  Endotracheal tube tip in good position.  Other lines and tubes are stable. 2. No acute cardiopulmonary abnormality.  Original Report Authenticated By: Harley Hallmark, M.D.   Dg Chest Port 1 View  11/14/2011  *RADIOLOGY REPORT*  Clinical Data: Intubation  PORTABLE CHEST - 1 VIEW  Comparison: Portable exam 2015 hours compared to 1720 hours  Findings: Tip of endotracheal tube within right mainstem bronchus, recommend withdrawal approximately 4.5 to 5.0 cm for positioning 2 cm above carina. Nasogastric tube within stomach. Right jugular central venous catheter, tip  projecting over SVC. Normal heart size, mediastinal contours, pulmonary vascularity. Lungs clear. Pleural effusion or pneumothorax.  IMPRESSION: Right mainstem bronchus intubation, recommend withdrawal 4.5-5.0 cm.  Findings called to Sharen Hones in ED at 2026 hours on 11/14/2011.  Original Report Authenticated By: Lollie Marrow, M.D.   Dg Chest Portable 1 View  11/14/2011  *RADIOLOGY REPORT*  Clinical Data: Hyperglycemia and altered mental  status  PORTABLE CHEST - 1 VIEW  Comparison: 02/07/2010  Findings: Normal heart size.  Clear lungs.  IMPRESSION: Negative.  Original Report Authenticated By: Donavan Burnet, M.D.   Dg Chest Port 1v Same Day  11/14/2011  *RADIOLOGY REPORT*  Clinical Data: 46 year old female with central line placement.  PORTABLE CHEST - 1 VIEW SAME DAY  Comparison: 1640 hours the same day and earlier.  Findings: Portable spine AP view 1720 hours.  Right IJ central line placed.  Tip projects about 6 cm below the level of the carina.  No pneumothorax.  Stable lung volumes.  The lungs remain clear. Stable cardiac size and mediastinal contours.  IMPRESSION: Right IJ central line tip projects at the level of the right atrium.  This could be retracted 1-2 cm to allow for cavoatrial junction or lower SVC level placement. No pneumothorax.  Original Report Authenticated By: Harley Hallmark, M.D.   Dg Fluoro Guide Lumbar Puncture  11/17/2011  Clinical Data: Change mental status, new onset seizure, fever, history of syphilis;  LUMBAR PUNCTURE FLUORO GUIDE  Fluoro time:  0.6 minutes.  Comparison: Head CT from 11/14/2011 reviewed.  Findings:  I discussed the risks, benefits, and alternatives to lumbar puncture with the patient prior to the procedure. Emphasized risks included bleeding, infection, and headache. Epidural hematoma requiring surgical evacuation was discussed.  The patient voiced understanding and wished to proceed.  Written informed consent was obtained.  Using fluoroscopic guidance, an  appropriate skin site was identified at the midline back.  This area of skin was prepped and draped in the usual sterile fashion.  Anesthesia of the skin and deeper soft tissues was obtained using 1% lidocaine without epinephrine.  Using a standard 20 gauge spinal needle, the CSF space was punctured at the L3-L4 level on the second pass.  Clear CSF was obtained at this level. Opening pressure was 38 cm H2O.  A total of approximately 11 ml of clear CSF was obtained across four separate tubes.  IMPRESSION: Successful fluoro guided lumbar puncture.  CSF aliquots were sent to the laboratory for appropriate analysis.  Opening pressure is elevated at 38 cm H2O.  The procedure was well tolerated without evidence for immediate complications.  Original Report Authenticated By: ERIC A. MANSELL, M.D.   Admission HPI:  The patient is a 46 yo woman, history of DM I and cocaine abuse.  1-2 weeks prior to admission, the patient ran out of her insulin prescription, and began using old insulin bottles found in her house.  The patient was also found to be cocaine-positive on admission.  The patient was found on the floor of her apartment by her daughter, who called EMS.  The patient was found to be minimally responsive, with significantly elevated CBG's.  The patient was admitted to the ICU.  Admission Physical Exam Temp: [97.2 F (36.2 C)-97.5 F (36.4 C)] 97.2 F (36.2 C) (04/30 1731)  Pulse Rate: [44-108] 104 (04/30 1731)  Resp: [24-40] 26 (04/30 1731)  BP: (65-99)/(35-59) 99/38 mmHg (04/30 1731)  SpO2: [86 %-100 %] 100 % (04/30 1731)  General: Obtunded and unresponsive to commands, no gag / cough  Neuro: Sedated, synchronous, pupils are equal and reactive to light B/L, unable to examine fully due to obtundation  HEENT: pink conjunctivae, dry membranes  Neck: Supple, no JVD  Cardiovascular: Tachycardic RR, no M/R/G  Lungs: good air entry, no W/R/R  Abdomen: Soft, nontender, nondistended, bowel sounds present    Musculoskeletal: Moves all extremities, no pedal edema, cool  clammy extremities  Skin: No rash  Admission Labs Lab  11/14/11 1827  NA  144  K  5.3*  CL  108  CO2  <5*  BUN  59*  CREATININE  3.26*  CALCIUM  9.5  Glucose: 1244  Lab  11/14/11 1711  PHART  7.002*  PCO2ART  25.3*  PO2ART  83.0  HCO3  6.3*  O2SAT  89.0   Hospital Course by problem list: 1. Acute Diabetic Ketoacidosis - The patient presented with severe DKA, likely secondary to using expired insulin at home in the setting of DM I, and cocaine use.  The patient was noted to have an initial anion gap of 31, with a glucose of 1244, and a pH of 7.0.  The patient was intubated, and started on aggressive IV fluids, an insulin drip, and a bicarb drip.  As the patient was rehydrated and her CBG's decreased, her hypernatremia was unmasked, with a peak sodium of 161, likely secondary to osmotic diuresis in the setting of DKA.  The patient's BP's dropped to a nadir of 66/26, likely representing hypovolemic shock vs septic shock (2/2 aspiration pneumonia, see below), and the patient was started on vanc/zosyn.  The patient was also noted to have complications of AKI and elevated LFT's.  On day 3 of hospitalization, the patient was noted to have left-sided face twitching and right arm tonic-clonic movement concerning for seizure, and was started on Keppra, but EEG showed no abnormality, and keppra was subsequently discontinued.  The patient's labs gradually improved, and the patient was extubated on 5/7, with intermittent confusion and combativeness in the few days following extubation, but with gradual return of mental status to the patient's baseline.  The patient was restarted on her home insulin regimen, as well as sliding scale insulin, and educated about the proper use, storage, and handling of insulin.  2. Aspiration pneumonia - the patient was noted to have a LLL infiltrate on x-ray 5/5, and fever to 102.6 (on 5/7), as well as  leukocytosis, concerning for aspiration pneumonia given patient's AMS and inability to protect her airway.  The patient was treated with vanc/zosyn 4/30-5/4, followed by ceftriaxone 5/4-5/8.  Blood cultures were found to be negative, and sputum cultures showed MSSA.  The patient's fever and leukocytosis resolved by discharge, and the patient noted no further respiratory complaints.  3. Acute kidney injury - the patient was noted to have AKI on admission, likely prerenal secondary to volume depletion secondary to osmotic diuresis from DKA, with a peak creatinine of 3.26.  The patient's creatinine improved to her baseline with IV fluids, and was 0.8 on the day of discharge.  4. Shock liver - the patient was noted to have normal LFT's on admission, but a transient increase in LFT's during hospitalization, likely secondary to shock, with BP's as low as 66/26.  The patient's LFT's normalized over the course of hospitalization with fluid resuscitation.  5. Cocaine abuse - the patient has a history of cocaine abuse, and was found to be cocaine positive on admission.  The patient was counseled on cocaine cessation.  6. Deconditioning - after the patient's long hospital stay, she was noted to have unsteadiness on her feet when working with PT.  PT recommended SNF vs 24-hour home care with [facility] PT/OT.  The patient was offered SNF, but refused SNF placement multiple times.  The patient's husband, Kathlene November, agreed to accept the responsibility of being the patient's 24-hr caregiver, and the patient was discharged to home.  Time  spent on discharge: 45 minutes  Discharge Vitals:  BP 110/71  Pulse 106  Temp(Src) 97.9 F (36.6 C) (Oral)  Resp 18  Ht 5\' 5"  (1.651 m)  Wt 129 lb 6.6 oz (58.7 kg)  BMI 21.53 kg/m2  SpO2 100%  LMP 11/01/2011  Discharge Labs:  Results for orders placed during the hospital encounter of 11/14/11 (from the past 24 hour(s))  GLUCOSE, CAPILLARY     Status: Abnormal   Collection Time    11/27/11  4:33 PM      Component Value Range   Glucose-Capillary 175 (*) 70 - 99 (mg/dL)  GLUCOSE, CAPILLARY     Status: Abnormal   Collection Time   11/27/11  9:14 PM      Component Value Range   Glucose-Capillary 222 (*) 70 - 99 (mg/dL)   Comment 1 Notify RN     Comment 2 Documented in Chart    GLUCOSE, CAPILLARY     Status: Abnormal   Collection Time   11/28/11 12:55 AM      Component Value Range   Glucose-Capillary 396 (*) 70 - 99 (mg/dL)   Comment 1 Notify RN     Comment 2 Documented in Chart    GLUCOSE, CAPILLARY     Status: Abnormal   Collection Time   11/28/11  7:48 AM      Component Value Range   Glucose-Capillary 227 (*) 70 - 99 (mg/dL)  BASIC METABOLIC PANEL     Status: Abnormal   Collection Time   11/28/11  8:04 AM      Component Value Range   Sodium 136  135 - 145 (mEq/L)   Potassium 3.9  3.5 - 5.1 (mEq/L)   Chloride 103  96 - 112 (mEq/L)   CO2 22  19 - 32 (mEq/L)   Glucose, Bld 264 (*) 70 - 99 (mg/dL)   BUN 14  6 - 23 (mg/dL)   Creatinine, Ser 1.19  0.50 - 1.10 (mg/dL)   Calcium 8.1 (*) 8.4 - 10.5 (mg/dL)   GFR calc non Af Amer 86 (*) >90 (mL/min)   GFR calc Af Amer >90  >90 (mL/min)  GLUCOSE, CAPILLARY     Status: Abnormal   Collection Time   11/28/11 11:42 AM      Component Value Range   Glucose-Capillary 232 (*) 70 - 99 (mg/dL)    Signed: Janalyn Harder 11/28/2011, 2:17 PM

## 2011-11-27 NOTE — Progress Notes (Signed)
Subjective: Patient refused her evening insulin yesterday, stating that she "thought we were giving her too much", and as such had CBG's in the 400's this morning, which dropped to the 200's with sliding scale insulin (though the patient continued to refuse prandial insulin in addition to sliding scale insulin).  Mental status at baseline.  This patient will need close outpatient follow-up.  I've given her a glucometer today and taught her how to use it, and I've had an in-person conversation with the patient's daughter to assist her with her medications at home.  Objective: Vital signs in last 24 hours: Filed Vitals:   11/26/11 1858 11/26/11 2043 11/27/11 0446 11/27/11 1000  BP: 111/64 134/68 122/72 115/73  Pulse: 74 107 121 121  Temp: 97.9 F (36.6 C) 99.3 F (37.4 C) 97.6 F (36.4 C) 98.9 F (37.2 C)  TempSrc: Oral Oral Oral Oral  Resp: 18 19 19 20   Height:  5\' 5"  (1.651 m)    Weight:  129 lb 8 oz (58.741 kg)    SpO2: 94% 99% 93% 96%   Weight change: -2 lb 12.4 oz (-1.259 kg)  Intake/Output Summary (Last 24 hours) at 11/27/11 1132 Last data filed at 11/27/11 0900  Gross per 24 hour  Intake    820 ml  Output   1375 ml  Net   -555 ml   General: alert, cooperative, and in no apparent distress HEENT: pupils equal round and reactive to light, vision grossly intact, oropharynx clear and non-erythematous  Neck: supple, no lymphadenopathy Lungs: clear to ascultation bilaterally, normal work of respiration, no wheezes, rales, ronchi Heart: tachycardic, regular rhythm, no murmurs, gallops, or rubs Abdomen: soft, non-tender, non-distended, normal bowel sounds Msk: no joint edema, warmth, or erythema  Extremities: no cyanosis, clubbing, or edema Neurologic: alert & oriented X3, cranial nerves II-XII intact, strength grossly intact, sensation intact to light touch  Lab Results: Basic Metabolic Panel:  Lab 11/27/11 4540 11/25/11 0445 11/22/11 0517  NA 130* 131* --  K 4.8 4.3 --  CL  92* 97 --  CO2 15* 20 --  GLUCOSE 486* 321* --  BUN 17 15 --  CREATININE 0.86 0.76 --  CALCIUM 8.9 8.5 --  MG -- -- 1.8  PHOS -- -- 4.7*   Liver Function Tests:  Lab 11/27/11 0547 11/23/11 0508  AST 39* 26  ALT 35 23  ALKPHOS 92 85  BILITOT 0.2* 0.1*  PROT 7.4 6.2  ALBUMIN 2.6* 2.2*   CBC:  Lab 11/25/11 0445 11/24/11 0711 11/22/11 0517  WBC 10.0 7.7 --  NEUTROABS -- -- 4.0  HGB 9.3* 9.8* --  HCT 29.5* 30.6* --  MCV 63.9* 63.1* --  PLT 348 345 --   CBG:  Lab 11/27/11 0738 11/27/11 0623 11/26/11 2246 11/26/11 1838 11/26/11 1656 11/26/11 1148  GLUCAP 476* 440* 530* 219* 61* 243*   Urine Drug Screen: Drugs of Abuse     Component Value Date/Time   LABOPIA NONE DETECTED 11/14/2011 1723   COCAINSCRNUR POSITIVE* 11/14/2011 1723   LABBENZ NONE DETECTED 11/14/2011 1723   AMPHETMU NONE DETECTED 11/14/2011 1723   THCU NONE DETECTED 11/14/2011 1723   LABBARB NONE DETECTED 11/14/2011 1723    Urinalysis:  Lab 11/22/11 1131  COLORURINE STRAW*  LABSPEC 1.005  PHURINE 8.0  GLUCOSEU NEGATIVE  HGBUR NEGATIVE  BILIRUBINUR NEGATIVE  KETONESUR NEGATIVE  PROTEINUR NEGATIVE  UROBILINOGEN 0.2  NITRITE NEGATIVE  LEUKOCYTESUR NEGATIVE    Medications: I have reviewed the patient's current medications. Scheduled Meds:   .  acetaminophen  650 mg Rectal Once  . antiseptic oral rinse  15 mL Mouth Rinse q12n4p  . aspirin  81 mg Per Tube Daily  . chlorhexidine  15 mL Mouth Rinse BID  . divalproex  750 mg Oral QHS  . Flexpen Starter Kit  1 kit Other Once  . heparin subcutaneous  5,000 Units Subcutaneous Q8H  . insulin aspart  0-15 Units Subcutaneous TID WC  . insulin aspart  3 Units Subcutaneous TID WC  . insulin glargine  8 Units Subcutaneous QHS  . latanoprost  1 drop Both Eyes QHS  . mulitivitamin with minerals  1 tablet Oral Daily  . nystatin  5 mL Oral QID  . pantoprazole  40 mg Oral Q1200  . risperiDONE  0.5 mg Per Tube BID  . DISCONTD: insulin aspart  6 Units Subcutaneous  TID WC   Continuous Infusions:   . sodium chloride 20 mL/hr at 11/26/11 2042   PRN Meds:. Assessment/Plan: # Encephalopathy - resolved, likely secondary to DKA (patient had run out of her insulin prescription and was using expired bottles of insulin prior to admission, in addition to using cocaine) + septic shock (possible pneumonia) + cocaine use  -CBG's well-controlled  -Lantus 8 qhs, novolog 6 with meals, SSI  -IVF at [facility]  -depakote  -pt working with PT/OT, has spent some time in the chair, walking with walker   # Pneumonia - resolved, suspected aspiration pneumonia, due to leukocytosis, fever to 102.6 (5/7), and LLL opacity seen on cxr 5/5, which developed while patient was intubated. Antibiotics have been discontinued (initially vanc/zosyn, then ceftriaxone), patient now afebrile since 5/8, leukocytosis has resolved.   # AKI - resolved, presented with Cr = 3.26 likely due to volume depletion vs ATN from shock. Now below previous baseline of 1.1.  -follow BMET's   # Shock liver - resolved, LFT's normalized  -follow LFT's   # Bipolar disorder - chronic, stable  -continue risperidone, depakote   # Nutritional status  -patient now tolerating regular diet   # Prophy - lovenox   # Dispo - discharge home today, to apartment where husband and daughter also reside, with home health PT/OT, and outpatient follow-up.    LOS: 13 days   Janalyn Harder 11/27/2011, 11:32 AM

## 2011-11-27 NOTE — Progress Notes (Signed)
Nutrition Follow-up  Advanced to CHO Modified diet on 5/9. Pt to d/c today per MD. This RD provided Balanced Plate with pt, chose to review on her own time. RN reports pt was noncompliant with insulin sliding scale, resulting in elevated blood sugars >300-400.   Intake variable ranging from 25 - 85%.  Diet Order:  CHO Modified Medium (1600 - 2000 kcal)  Meds: Scheduled Meds:   . acetaminophen  650 mg Rectal Once  . antiseptic oral rinse  15 mL Mouth Rinse q12n4p  . aspirin  81 mg Per Tube Daily  . chlorhexidine  15 mL Mouth Rinse BID  . divalproex  750 mg Oral QHS  . heparin subcutaneous  5,000 Units Subcutaneous Q8H  . insulin aspart  0-15 Units Subcutaneous TID WC  . insulin aspart  3 Units Subcutaneous TID WC  . insulin glargine  8 Units Subcutaneous QHS  . latanoprost  1 drop Both Eyes QHS  . mulitivitamin with minerals  1 tablet Oral Daily  . nystatin  5 mL Oral QID  . pantoprazole  40 mg Oral Q1200  . risperiDONE  0.5 mg Per Tube BID  . DISCONTD: insulin aspart  6 Units Subcutaneous TID WC   Continuous Infusions:   . sodium chloride 20 mL/hr at 11/26/11 2042   PRN Meds:.  Labs:  CMP     Component Value Date/Time   NA 130* 11/27/2011 0547   K 4.8 11/27/2011 0547   CL 92* 11/27/2011 0547   CO2 15* 11/27/2011 0547   GLUCOSE 486* 11/27/2011 0547   BUN 17 11/27/2011 0547   CREATININE 0.86 11/27/2011 0547   CALCIUM 8.9 11/27/2011 0547   PROT 7.4 11/27/2011 0547   ALBUMIN 2.6* 11/27/2011 0547   AST 39* 11/27/2011 0547   ALT 35 11/27/2011 0547   ALKPHOS 92 11/27/2011 0547   BILITOT 0.2* 11/27/2011 0547   GFRNONAA 79* 11/27/2011 0547   GFRAA >90 11/27/2011 0547   CBG (last 3)   Basename 11/27/11 0738 11/27/11 0623 11/26/11 2246  GLUCAP 476* 440* 530*   Lab Results  Component Value Date   HGBA1C 8.6 04/04/2011    Intake/Output Summary (Last 24 hours) at 11/27/11 1109 Last data filed at 11/27/11 0900  Gross per 24 hour  Intake    820 ml  Output   1375 ml  Net   -555 ml    Weight Status:  58.7 kg - close to 5/1 weight of 56.2 kg (was 72.6 kg on 5/7 prior to lasix administration)  Estimated needs:  1450-1650 kcals, 70-80 grams protein daily  Nutrition Dx:  Inadequate oral intake now r/t variable appetite AEB variable meal intake.  Goal:  Intake to meet 90-100% of estimated nutrition needs, currently, not met consistently.  Intervention:   RD to continue to follow and reinforce education as needed  Monitor:  Weights, labs, PO intake, I/O's  Adair Laundry Pager #:  [phone]

## 2011-11-27 NOTE — Progress Notes (Signed)
   CARE MANAGEMENT NOTE 11/27/2011  Patient:  [name], [name]   Account Number:  [id number]  Date Initiated:  11/15/2011  Documentation initiated by:  Avie Arenas  Subjective/Objective Assessment:   Unresponsive - hyperglycemic.     Action/Plan:   Anticipated DC Date:  11/20/2011   Anticipated DC Plan:  HOME W HOME HEALTH SERVICES      DC Planning Services  CM consult      Choice offered to / List presented to:             Status of service:  In process, will continue to follow Medicare Important Message given?   (If response is "NO", the following Medicare IM given date fields will be blank) Date Medicare IM given:   Date Additional Medicare IM given:    Discharge Disposition:    Per UR Regulation:  Reviewed for med. necessity/level of care/duration of stay  If discussed at Long Length of Stay Meetings, dates discussed:    Comments:  11/27/2011 [address] RN, [place] [phone]  Met with patient to discuss discharge planning and home health needs. Her daughter lives with her. PCP: Dr Charm Barges she has appointment 2x/month for follow up.  Pharmacy: [facility] on [address]. She needs a new blood sugar machine and script for insulin. Discussed possible home health PT. She declines PT stating she can get around and gets enough excercise. CM to follow up with PT regarding evaluation and discharge needs.

## 2011-11-27 NOTE — Discharge Instructions (Signed)
You were hospitalized for high blood sugar, a condition that can sometimes be deadly.  We are fortunate that you were able to recover fully, but it is VERY important to understand your insulin dosing, to prevent this from happening in the future.  Your insulin schedule is as follows: 1. Take Lantus (long-acting insulin), 8 units every evening 2. Take Novolog (short-acting insulin), at these times:      15 units every morning after breakfast      8 units every day after lunch      8 units every day after dinner

## 2011-11-28 ENCOUNTER — Telehealth: Payer: Self-pay | Admitting: Licensed Clinical Social Worker

## 2011-11-28 LAB — GLUCOSE, CAPILLARY
Glucose-Capillary: 227 mg/dL — ABNORMAL HIGH (ref 70–99)
Glucose-Capillary: 396 mg/dL — ABNORMAL HIGH (ref 70–99)
Glucose-Capillary: 232 mg/dL — ABNORMAL HIGH (ref 70–99)

## 2011-11-28 LAB — BASIC METABOLIC PANEL
BUN: 14 mg/dL (ref 6–23)
CO2: 22 mEq/L (ref 19–32)
Chloride: 103 mEq/L (ref 96–112)
Potassium: 3.9 mEq/L (ref 3.5–5.1)
Sodium: 136 mEq/L (ref 135–145)

## 2011-11-28 MED ORDER — LATANOPROST 0.005 % OP SOLN: 1.0000 [drp] | Freq: Every day | OPHTHALMIC | Status: DC

## 2011-11-28 MED ORDER — INSULIN GLARGINE 100 UNIT/ML ~~LOC~~ SOLN: 8.0000 [IU] | Freq: Every day | SUBCUTANEOUS | Status: DC

## 2011-11-28 MED ORDER — INSULIN LISPRO 100 UNIT/ML ~~LOC~~ SOLN: 8.0000 [IU] | Freq: Three times a day (TID) | SUBCUTANEOUS | Status: DC

## 2011-11-28 NOTE — Significant Event (Signed)
I spoke with patient's husband at 5:00 pm last night (5/13), regarding the patient's home situation.  The husband states that he has been a 24-hr caregiver for another family member in the past for a period of several months.  He states that he feels willing and able to provide true 24-hr care for the patient.  I explained that that would include helping the patient up stairs, helping her to and from the bathroom, and with any other physical activity.  The husband expressed understanding and willingness to undertake this responsibility.  I spoke with the patient and her husband again about the possibility of SNF.  The patient has been to [facility] in the past.  After several attempts, the patient continued to decline SNF placement, though this would be the safest discharge plan for her, stating that she "just wanted to go home".  The patient's husband, Kathlene November, can be reached at [phone] with any further questions.

## 2011-11-28 NOTE — Telephone Encounter (Signed)
I have not spoken to her about the [facility] referral. However, she is very pleasant and wouldn't get upset even if it was something that she was not interested in. She has a morning appt with Dr Baltazar Apo later this month in the AM. Would you or Dr Baltazar Apo be willing to chat with her then and see if she is interested before we send [facility] to her home? Thanks

## 2011-11-28 NOTE — Progress Notes (Signed)
   CARE MANAGEMENT NOTE 11/28/2011  Patient:  [name], [name]   Account Number:  [id number]  Date Initiated:  11/15/2011  Documentation initiated by:  Avie Arenas  Subjective/Objective Assessment:   Unresponsive - hyperglycemic.     Action/Plan:   Anticipated DC Date:  11/20/2011   Anticipated DC Plan:  HOME W HOME HEALTH SERVICES      DC Planning Services  CM consult      Choice offered to / List presented to:     DME arranged  3-N-1  Levan Hurst      DME agency  [facility]        Status of service:  In process, will continue to follow Medicare Important Message given?   (If response is "NO", the following Medicare IM given date fields will be blank) Date Medicare IM given:   Date Additional Medicare IM given:    Discharge Disposition:    Per UR Regulation:  Reviewed for med. necessity/level of care/duration of stay  If discussed at Long Length of Stay Meetings, dates discussed:    Comments:  11/28/2011 [address] RN, [place] [phone] [facility]: spoke with Darian/ RW 5 in wheels and 3:1 commode   11/27/2011  [address] RN, [place] [phone] Spoke with PT Jeanice Lim regarding patient's PT recommendations for home PT/OT, RW and 3:1 commode and 24 hour supervision. Discussed the patient had declined DME, advised home PT not covered with her insurance. CM will update MD. CM talked with patient again regarding the DME and she agreed for RW and 3:1 commode.  Spoke with Dr Manson Passey regarding home PT/OT not covered and patient agreed to RW and 3:1 commode, the PT recommends 24 hour supervision and patient states she does have someone with her all the time. He will see the patient to tell her she will need to stay until tomorrow due to elevated glucose levels. He will call the daughter to verify the home supervision to determine discharge plan.  CM to continue to follow for discharge planning.  11/27/2011 [address] RN, [place] [phone] Met with  patient to discuss discharge planning and home health needs. Her daughter lives with her. PCP: Dr Charm Barges she has appointment 2x/month for follow up.  Pharmacy: [facility] on [address]. She needs a new blood sugar machine and script for insulin. Discussed possible home health PT. She declines PT stating she can get around and gets enough excercise. CM to follow up with PT regarding evaluation and discharge needs.

## 2011-11-28 NOTE — Evaluation (Signed)
Occupational Therapy Evaluation and Discharge Patient Details Name: [name] MRN: [id number] DOB: 03/20/1966 Today's Date: 11/28/2011 Time: 4098-1191 OT Time Calculation (min): 15 min  OT Assessment / Plan / Recommendation Clinical Impression  Pt. presents with DKA and overall deconditioning. Pt. will benefit from further [facility] and supervision at D/C home. Will defer further OT to [facility] due to pt. D'C'ing home today. Pt. will have assist from family 24 hrs a day according to pt.     OT Assessment  All further OT needs can be met in the next venue of care    Follow Up Recommendations  Home health OT;Supervision/Assistance - 24 hour       Equipment Recommendations  Rolling walker with 5" wheels;3 in 1 bedside comode          Precautions / Restrictions Precautions Precautions: Fall Restrictions Weight Bearing Restrictions: No       ADL  Eating/Feeding: Set up;Performed Where Assessed - Eating/Feeding: Chair Grooming: Performed;Wash/dry face;Set up;Minimal assistance Where Assessed - Grooming: Standing at sink Upper Body Bathing: Simulated;Set up Where Assessed - Upper Body Bathing: Sitting, chair Lower Body Bathing: Simulated;Set up Where Assessed - Lower Body Bathing: Sit to stand from chair Upper Body Dressing: Simulated;Set up Where Assessed - Upper Body Dressing: Sitting, chair Lower Body Dressing: Set up;Performed Where Assessed - Lower Body Dressing: Sitting, chair Toilet Transfer: Simulated;Minimal assistance Toilet Transfer Method: Ambulating Toilet Transfer Equipment: Other (comment) (recliner) Toileting - Clothing Manipulation: Simulated;Set up Where Assessed - Toileting Clothing Manipulation: Sit on 3-in-1 or toilet Toileting - Hygiene: Simulated;Set up Where Assessed - Toileting Hygiene: Sit on 3-in-1 or toilet Tub/Shower Transfer: Not assessed Equipment Used: Rolling walker Ambulation Related to ADLs: Pt. min assist due to decreased safety with use of  RW ADL Comments: Pt. educated on safety at home and with mobility during ADLs. Pt. overall deconditioned and will benefit from HHOT to improved overall activity tolerance with ADLs to decrease fall risk at home.    OT Diagnosis: Generalized weakness  OT Problem List: Decreased strength;Decreased activity tolerance;Impaired balance (sitting and/or standing);Decreased knowledge of use of DME or AE;Pain        Visit Information  Last OT Received On: 11/28/11 Assistance Needed: +1    Subjective Data  Subjective: "I am going home today" Patient Stated Goal: "I am going home"   Prior Functioning  Home Living Lives With: Daughter;Friend(s) Available Help at Discharge: Family;Available 24 hours/day Type of Home: Apartment Home Access: Stairs to enter Entrance Stairs-Number of Steps: 2 Entrance Stairs-Rails: Left Home Layout: Other (Comment) (1st floor) Bathroom Shower/Tub: Walk-in shower;Door Teacher, early years/pre: Yes How Accessible: Accessible via walker Home Adaptive Equipment: None Prior Function Level of Independence: Independent Able to Take Stairs?: Yes Driving: Yes Vocation: On disability    Cognition  Overall Cognitive Status: Impaired Area of Impairment: Safety/judgement;Awareness of deficits Arousal/Alertness: Awake/alert Orientation Level: Appears intact for tasks assessed Behavior During Session: [facility] for tasks performed Safety/Judgement: Decreased awareness of need for assistance;Decreased safety judgement for tasks assessed;Impulsive Awareness of Deficits: Decreased awareness of fall risk       Mobility Bed Mobility Bed Mobility: Supine to Sit Supine to Sit: 4: Min guard;With rails Sitting - Scoot to Edge of Bed: 4: Min guard Details for Bed Mobility Assistance: Pt. very close to the end of bed and trying to sit up with covers still tangled around her legs. Transfers Transfers: Sit to Stand;Stand to Sit Sit to Stand: 4: Min  guard;With upper extremity assist;From bed Stand  to Sit: 4: Min guard;With upper extremity assist;To chair/3-in-1 Details for Transfer Assistance: Pt. provided with mod verbal cues for safe hand placement due to pt. attempting to pull on RW to stand.         End of Session OT - End of Session Equipment Utilized During Treatment: Gait belt Activity Tolerance: Patient tolerated treatment well Patient left: in chair;with call bell/phone within reach Nurse Communication: Mobility status   Kortne All, OTR/L Pager [phone] 11/28/2011, 11:08 AM

## 2011-11-28 NOTE — Telephone Encounter (Signed)
CSW received referral for [name] to be referred to [facility] for DM mgmt.  Pt has been d/c from hospitalization.  CSW attempted pt home number, not available at this time.  CSW called pt's mobile number, female friend answered and was unaware pt was d/c from hospital.  Pt is eligible for [facility] case management, as she has Medicaid Access II.  CSW will continue to contact pt to confirm agreeable to [facility] referral.

## 2011-11-28 NOTE — Progress Notes (Signed)
Subjective: Patient slept well overnight.  Patient accepted insulin yesterday, with improved CBG's in the 200's.  Awaiting am BMET to approximate acid-base status.  Plan for discharge today.  Objective: Vital signs in last 24 hours: Filed Vitals:   11/27/11 1400 11/27/11 1700 11/27/11 2028 11/28/11 0515  BP: 109/67 120/70 121/79 120/74  Pulse: 99 111 103 91  Temp: 98.6 F (37 C) 98.8 F (37.1 C) 98 F (36.7 C) 98.7 F (37.1 C)  TempSrc: Oral Oral Oral Oral  Resp: 20 18 18 18   Height:      Weight:   129 lb 6.6 oz (58.7 kg)   SpO2: 98% 97% 99% 98%   Weight change: -1.4 oz (-0.041 kg)  Intake/Output Summary (Last 24 hours) at 11/28/11 0801 Last data filed at 11/28/11 0700  Gross per 24 hour  Intake   2621 ml  Output      0 ml  Net   2621 ml   General: alert, cooperative, and in no apparent distress HEENT: pupils equal round and reactive to light, vision grossly intact, oropharynx clear and non-erythematous  Neck: supple, no lymphadenopathy Lungs: clear to ascultation bilaterally, normal work of respiration, no wheezes, rales, ronchi Heart: tachycardic, regular rhythm, no murmurs, gallops, or rubs Abdomen: soft, non-tender, non-distended, normal bowel sounds Msk: no joint edema, warmth, or erythema  Extremities: no cyanosis, clubbing, or edema Neurologic: alert & oriented X3, cranial nerves II-XII intact, strength grossly intact, sensation intact to light touch  Lab Results: Basic Metabolic Panel:  Lab 11/27/11 1191 11/27/11 0547 11/22/11 0517  NA 131* 130* --  K 4.1 4.8 --  CL 94* 92* --  CO2 20 15* --  GLUCOSE 312* 486* --  BUN 17 17 --  CREATININE 0.82 0.86 --  CALCIUM 9.0 8.9 --  MG -- -- 1.8  PHOS -- -- 4.7*   Liver Function Tests:  Lab 11/27/11 0547 11/23/11 0508  AST 39* 26  ALT 35 23  ALKPHOS 92 85  BILITOT 0.2* 0.1*  PROT 7.4 6.2  ALBUMIN 2.6* 2.2*   CBC:  Lab 11/25/11 0445 11/24/11 0711 11/22/11 0517  WBC 10.0 7.7 --  NEUTROABS -- -- 4.0  HGB  9.3* 9.8* --  HCT 29.5* 30.6* --  MCV 63.9* 63.1* --  PLT 348 345 --   CBG:  Lab 11/28/11 0748 11/28/11 0055 11/27/11 2114 11/27/11 1633 11/27/11 1142 11/27/11 0919  GLUCAP 227* 396* 222* 175* 273* 260*   Urine Drug Screen: Drugs of Abuse     Component Value Date/Time   LABOPIA NONE DETECTED 11/14/2011 1723   COCAINSCRNUR POSITIVE* 11/14/2011 1723   LABBENZ NONE DETECTED 11/14/2011 1723   AMPHETMU NONE DETECTED 11/14/2011 1723   THCU NONE DETECTED 11/14/2011 1723   LABBARB NONE DETECTED 11/14/2011 1723    Urinalysis:  Lab 11/22/11 1131  COLORURINE STRAW*  LABSPEC 1.005  PHURINE 8.0  GLUCOSEU NEGATIVE  HGBUR NEGATIVE  BILIRUBINUR NEGATIVE  KETONESUR NEGATIVE  PROTEINUR NEGATIVE  UROBILINOGEN 0.2  NITRITE NEGATIVE  LEUKOCYTESUR NEGATIVE    Medications: I have reviewed the patient's current medications. Scheduled Meds:    . acetaminophen  650 mg Rectal Once  . antiseptic oral rinse  15 mL Mouth Rinse q12n4p  . aspirin  81 mg Per Tube Daily  . chlorhexidine  15 mL Mouth Rinse BID  . divalproex  750 mg Oral QHS  . heparin subcutaneous  5,000 Units Subcutaneous Q8H  . insulin aspart  0-15 Units Subcutaneous TID WC  . insulin aspart  0-5 Units Subcutaneous QHS  . insulin aspart  3 Units Subcutaneous TID WC  . insulin glargine  10 Units Subcutaneous QHS  . latanoprost  1 drop Both Eyes QHS  . mulitivitamin with minerals  1 tablet Oral Daily  . nystatin  5 mL Oral QID  . pantoprazole  40 mg Oral Q1200  . risperiDONE  0.5 mg Per Tube BID  . DISCONTD: Flexpen Starter Kit  1 kit Other Once  . DISCONTD: insulin aspart  6 Units Subcutaneous TID WC  . DISCONTD: insulin glargine  8 Units Subcutaneous QHS   Continuous Infusions:    . sodium chloride 125 mL/hr at 11/28/11 0700   PRN Meds:. Assessment/Plan: # Encephalopathy - resolved, likely secondary to DKA (patient had run out of her insulin prescription and was using expired bottles of insulin prior to admission, in  addition to using cocaine) + septic shock (possible pneumonia) + cocaine use  -CBG's well-controlled  -Lantus 8 qhs, novolog 6 with meals, SSI  -IVF at 125 cc/hr -depakote  -pt working with PT/OT, has spent some time in the chair, walking with walker   # Pneumonia - resolved, suspected aspiration pneumonia, due to leukocytosis, fever to 102.6 (5/7), and LLL opacity seen on cxr 5/5, which developed while patient was intubated. Antibiotics have been discontinued (initially vanc/zosyn, then ceftriaxone), patient now afebrile since 5/8, leukocytosis has resolved.   # AKI - resolved, presented with Cr = 3.26 likely due to volume depletion vs ATN from shock. Now below previous baseline of 1.1.  -follow BMET's   # Shock liver - resolved, LFT's normalized  -follow LFT's   # Bipolar disorder - chronic, stable  -continue risperidone, depakote   # Nutritional status  -patient now tolerating regular diet   # Prophy - lovenox   # Dispo - plan for discharge home, with 24-hr assistance at home (please see significant event note from earlier today), pending no acidosis on BMET.    LOS: 14 days   Janalyn Harder 11/28/2011, 8:01 AM

## 2011-11-28 NOTE — Progress Notes (Signed)
Physical Therapy Treatment Patient Details Name: Bernestine Holsapple MRN: [id number] DOB: 1965/12/26 Today's Date: 11/28/2011 Time: 1210-1225 PT Time Calculation (min): 15 min  PT Assessment / Plan / Recommendation Comments on Treatment Session  Appreciate Dr. Theora Gianotti follow-up with family re: available assist at home;   Pt will have 24 hour assist available to her, so dc home is a sound plan;   Agree with Outpt PT follow-up for gait and balance dysfunction    Follow Up Recommendations  Supervision/Assistance - 24 hour;Outpatient PT    Barriers to Discharge        Equipment Recommendations  Rolling walker with 5" wheels;3 in 1 bedside comode (Equipment already delovered to pt)    Recommendations for Other Services    Frequency Min 3X/week   Plan Discharge plan needs to be updated    Precautions / Restrictions Precautions Precautions: Fall   Pertinent Vitals/Pain no apparent distress     Mobility  Bed Mobility Bed Mobility: Supine to Sit Supine to Sit: 4: Min guard (without physical contact) Sitting - Scoot to Edge of Bed: 5: Supervision Transfers Transfers: Sit to Stand;Stand to Sit Sit to Stand: 4: Min guard;With upper extremity assist;From bed Stand to Sit: 4: Min guard;With upper extremity assist;To chair/3-in-1 Details for Transfer Assistance: Continued need to verbal cueing for safety, hand placement Ambulation/Gait Ambulation/Gait Assistance: 4: Min guard Ambulation Distance (Feet): 120 Feet Assistive device: Rolling walker Ambulation/Gait Assistance Details: Cues for safety and posture; Cues to push down into RW for stability (at times pt pulled/lifted RW up); Pt staes she feels better and more stable in RW Gait Pattern: Decreased stride length Gait velocity: Decreased    Exercises     PT Diagnosis:    PT Problem List:   PT Treatment Interventions:     PT Goals Acute Rehab PT Goals Time For Goal Achievement: 12/07/11 Potential to Achieve Goals: Fair Pt  will go Supine/Side to Sit: with modified independence PT Goal: Supine/Side to Sit - Progress: Progressing toward goal Pt will go Sit to Stand: with modified independence PT Goal: Sit to Stand - Progress: Progressing toward goal Pt will go Stand to Sit: with modified independence PT Goal: Stand to Sit - Progress: Progressing toward goal Pt will Ambulate: 51 - 150 feet;with supervision;with least restrictive assistive device PT Goal: Ambulate - Progress: Progressing toward goal  Visit Information  Last PT Received On: 11/28/11 Assistance Needed: +1    Subjective Data  Subjective: Pt seems very excited at prospect of going home Patient Stated Goal: Home   Cognition  Behavior During Session: [facility] for tasks performed    Balance     End of Session PT - End of Session Equipment Utilized During Treatment: Gait belt Activity Tolerance: Patient tolerated treatment well Patient left: in chair;with call bell/phone within reach;with chair alarm set Nurse Communication: Mobility status    Olen Pel [place], [place] [phone]  11/28/2011, 2:40 PM

## 2011-11-28 NOTE — Progress Notes (Signed)
Pt. discharged to home. Pt after visit summary reviewed and pt capable of re verbalizing medications and follow up appointments. Pt remains stable. No signs and symptoms of distress. Educated to return to ER in the event of SOB, dizziness, chest pain, or fainting. Curren Mohrmann, RN  

## 2011-11-29 ENCOUNTER — Other Ambulatory Visit: Payer: Self-pay | Admitting: Internal Medicine

## 2011-11-29 DIAGNOSIS — E119 Type 2 diabetes mellitus without complications: Secondary | ICD-10-CM

## 2011-12-04 ENCOUNTER — Telehealth: Payer: Self-pay | Admitting: Licensed Clinical Social Worker

## 2011-12-04 NOTE — Telephone Encounter (Signed)
CSW placed call to pt as she has been d/c from hospital.  CSW unable to reach pt via phone.  CSW will meet up with pt at scheduled [facility] appt on 5/21 after MD visit to discuss [facility] resources and inform pt she will need to have scheduled appt with our Diabetes educator.

## 2011-12-05 ENCOUNTER — Telehealth: Payer: Self-pay | Admitting: Dietician

## 2011-12-05 ENCOUNTER — Encounter: Payer: Medicaid Other | Admitting: Internal Medicine

## 2011-12-06 NOTE — Telephone Encounter (Signed)
Left two messages to encourage patient to reschedule hospital follow up appointment.

## 2011-12-13 ENCOUNTER — Telehealth: Payer: Self-pay | Admitting: Licensed Clinical Social Worker

## 2011-12-13 NOTE — Telephone Encounter (Signed)
Pt referred to CSW for [facility] referral.  Pt did not show for her scheduled appt.  CSW placed call to pt to discuss referral to [facility].  Pt's home number no longer valid.  Left message at mobile number, female friend answered.  CSW left message for pt to return call to [facility].

## 2011-12-14 NOTE — Telephone Encounter (Signed)
CSW received return call from [name].  [name] left phone number [phone] as contact number.  CSW returned call and spoke with pt's female friend at the number above.  CSW explained benefits of [facility] referral, their ability to make home visits.  Pt's friend states he plans to move closer to pt to assist with care needs.  CSW requested friend to have [name] return CSW call between 9am-1pm.  CSW can make referral to [facility] if pt is in agreement.

## 2011-12-18 ENCOUNTER — Emergency Department (HOSPITAL_COMMUNITY)
Admission: EM | Admit: 2011-12-18 | Discharge: 2011-12-18 | Disposition: A | Discharge status: Home / Self Care | Payer: Medicaid Other

## 2011-12-18 ENCOUNTER — Emergency Department (HOSPITAL_COMMUNITY)
Admission: EM | Admit: 2011-12-18 | Discharge: 2011-12-18 | Disposition: A | Discharge status: Home / Self Care | Payer: Medicaid Other | Attending: Emergency Medicine | Admitting: Emergency Medicine

## 2011-12-18 ENCOUNTER — Encounter (HOSPITAL_COMMUNITY): Payer: Self-pay | Admitting: Cardiology

## 2011-12-18 DIAGNOSIS — I2510 Atherosclerotic heart disease of native coronary artery without angina pectoris: Secondary | ICD-10-CM | POA: Insufficient documentation

## 2011-12-18 DIAGNOSIS — E162 Hypoglycemia, unspecified: Secondary | ICD-10-CM

## 2011-12-18 DIAGNOSIS — E1069 Type 1 diabetes mellitus with other specified complication: Secondary | ICD-10-CM | POA: Insufficient documentation

## 2011-12-18 DIAGNOSIS — Z794 Long term (current) use of insulin: Secondary | ICD-10-CM | POA: Insufficient documentation

## 2011-12-18 LAB — URINALYSIS, ROUTINE W REFLEX MICROSCOPIC
Bilirubin Urine: NEGATIVE
Glucose, UA: 250 mg/dL — AB
Hgb urine dipstick: NEGATIVE
Ketones, ur: NEGATIVE mg/dL
Leukocytes, UA: NEGATIVE
Nitrite: NEGATIVE
Protein, ur: NEGATIVE mg/dL
Specific Gravity, Urine: 1.01 (ref 1.005–1.030)
Urobilinogen, UA: 0.2 mg/dL (ref 0.0–1.0)
pH: 7.5 (ref 5.0–8.0)

## 2011-12-18 LAB — POCT I-STAT, CHEM 8
Calcium, Ion: 1.11 mmol/L — ABNORMAL LOW (ref 1.12–1.32)
Chloride: 105 mEq/L (ref 96–112)
Glucose, Bld: 297 mg/dL — ABNORMAL HIGH (ref 70–99)
HCT: 41 % (ref 36.0–46.0)

## 2011-12-18 LAB — GLUCOSE, CAPILLARY

## 2011-12-18 MED ORDER — LIDOCAINE HCL (CARDIAC) 20 MG/ML IV SOLN
INTRAVENOUS | Status: AC
  Administered 2011-12-18: 13:00:00
  Filled 2011-12-18: qty 5

## 2011-12-18 MED ORDER — OXYCODONE-ACETAMINOPHEN 5-325 MG PO TABS
1.0000 | ORAL_TABLET | Freq: Once | ORAL | Status: AC
  Administered 2011-12-18: 1 via ORAL
  Filled 2011-12-18: qty 1

## 2011-12-18 MED ORDER — DEXTROSE 50 % IV SOLN
INTRAVENOUS | Status: AC
  Administered 2011-12-18: 13:00:00
  Filled 2011-12-18: qty 50

## 2011-12-18 NOTE — Discharge Instructions (Signed)
Follow up with your doctor for a recheck. Return here as needed. °

## 2011-12-18 NOTE — ED Notes (Signed)
Pt given apple juice and sandwich per PA orders.
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
## 2011-12-18 NOTE — ED Notes (Signed)
Pt to department after having a hypoglycemic episode. EMS reports that on arrival CBG was 23, unable to establish an IV. ESM placed an IO to the right leg and amp of D50 given. CBG increased to 170. Pt A&Ox4 on arrival. C/o pain to the right leg after IO insertion. Skin warm and dry. Pt reports chills and warm blankets given. No distress noted.

## 2011-12-18 NOTE — ED Notes (Signed)
Pt to department via EMS- pt was found by friend diaphoretic and lethargic. EMS reports that CBG was 23 on arrival. Now CBG-170 after amp of D50 and 50mg  lidocaine. Bp- 177/83 Hr-112. Reports that she takes 8 units of lantus and 15 of novolog this morning. IO to the right leg.

## 2011-12-18 NOTE — ED Provider Notes (Signed)
Medical screening examination/treatment/procedure(s) were performed by non-physician practitioner and as supervising physician I was immediately available for consultation/collaboration.   Hurman Horn, MD 12/18/11 2351

## 2011-12-18 NOTE — ED Notes (Signed)
Pt instructed on importance of obtaining strips in order to check blood glucose levels so as to appropriately dose herself with sliding scale insulin; pt verbalized understanding; however, states her pharmacy is out of strips; states they will nit have them in stock x2 months; suggested pt try [facility] or another pharmacy - states she does not have transportation to other pharmacies; I then suggested her pharmacy obtain strips from another pharmacy in order to fill RX for her - she then stated she was unable to afford the strips

## 2011-12-18 NOTE — ED Notes (Signed)
IO needle to RLE d/c'd without event; bleeding controlled - bandage applied - pt tolerated with minimal difficulty

## 2011-12-19 ENCOUNTER — Encounter: Payer: Self-pay | Admitting: Internal Medicine

## 2011-12-19 ENCOUNTER — Ambulatory Visit (HOSPITAL_COMMUNITY)
Admission: RE | Admit: 2011-12-19 | Discharge: 2011-12-19 | Disposition: A | Discharge status: Home / Self Care | Payer: Medicaid Other | Source: Ambulatory Visit | Attending: Internal Medicine | Admitting: Internal Medicine

## 2011-12-19 ENCOUNTER — Ambulatory Visit (INDEPENDENT_AMBULATORY_CARE_PROVIDER_SITE_OTHER): Payer: Medicaid Other | Admitting: Internal Medicine

## 2011-12-19 VITALS — BP 128/84 | HR 110 | Temp 97.1°F | Ht 61.0 in | Wt 129.8 lb

## 2011-12-19 DIAGNOSIS — M79674 Pain in right toe(s): Secondary | ICD-10-CM

## 2011-12-19 DIAGNOSIS — M79609 Pain in unspecified limb: Secondary | ICD-10-CM

## 2011-12-19 DIAGNOSIS — F489 Nonpsychotic mental disorder, unspecified: Secondary | ICD-10-CM

## 2011-12-19 DIAGNOSIS — R209 Unspecified disturbances of skin sensation: Secondary | ICD-10-CM | POA: Insufficient documentation

## 2011-12-19 DIAGNOSIS — F99 Mental disorder, not otherwise specified: Secondary | ICD-10-CM

## 2011-12-19 DIAGNOSIS — I2510 Atherosclerotic heart disease of native coronary artery without angina pectoris: Secondary | ICD-10-CM

## 2011-12-19 DIAGNOSIS — Z794 Long term (current) use of insulin: Secondary | ICD-10-CM

## 2011-12-19 DIAGNOSIS — E108 Type 1 diabetes mellitus with unspecified complications: Secondary | ICD-10-CM

## 2011-12-19 DIAGNOSIS — E109 Type 1 diabetes mellitus without complications: Secondary | ICD-10-CM

## 2011-12-19 MED ORDER — ASPIRIN 81 MG PO TBEC: 81.0000 mg | DELAYED_RELEASE_TABLET | Freq: Every day | ORAL | Status: DC

## 2011-12-19 MED ORDER — DIVALPROEX SODIUM ER 250 MG PO TB24: 750.0000 mg | ORAL_TABLET | Freq: Every day | ORAL | Status: DC

## 2011-12-19 MED ORDER — INSULIN GLARGINE 100 UNIT/ML ~~LOC~~ SOLN: 8.0000 [IU] | Freq: Every day | SUBCUTANEOUS | Status: DC

## 2011-12-19 MED ORDER — INSULIN LISPRO 100 UNIT/ML ~~LOC~~ SOLN: 8.0000 [IU] | Freq: Three times a day (TID) | SUBCUTANEOUS | Status: DC

## 2011-12-19 MED ORDER — PRAVASTATIN SODIUM 40 MG PO TABS: 40.0000 mg | ORAL_TABLET | Freq: Every day | ORAL | Status: DC

## 2011-12-19 MED ORDER — RISPERIDONE 3 MG PO TABS: 3.0000 mg | ORAL_TABLET | Freq: Every day | ORAL | Status: DC

## 2011-12-19 NOTE — Assessment & Plan Note (Signed)
Unclear etiology. She clearly has poor decision-making skills but does appear to have capacity. She can tell me that she may die from her decisions but continues to desire to live alone. She has not been taking any of her mental health medications. I refilled these today.
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
## 2011-12-19 NOTE — Patient Instructions (Signed)
Please use massage and ice your foot when you need to relieve your toe pain. We will get an xray to make sure it is not broken.  Please decrease your naproxen use. You should only take less than 500 mg daily.  We are very concerned about you. You have been in the emergency department and hospital for conditions that could prove fatal. We believe that he should consider moving into an assisted living facility so that your diabetes could be better managed. If you desire this we can arrange it.   Return to clinic to see Your primary care doctor in one month. Please bring all your medications to your next clinic appointment.

## 2011-12-19 NOTE — Assessment & Plan Note (Signed)
Overall her exam is very benign. There was no abnormality seen on x-ray. I think she may have some aspect of peripheral neuropathy and could have potentially bruised her to during her episode of hypoglycemia. I told her that narcotic pain medications weren't not be appropriate for this type of pain and her drug use makes them especially risky. I told her that the amount of naproxen she is taking is entirely too much. I suggested ice and massage  To manage her pain. She can take no more than 500 mg of naproxen twice daily.

## 2011-12-19 NOTE — Assessment & Plan Note (Addendum)
I am deeply concerned about this patient and her diabetes management.  Review of her glucometer shows that she has checked her glucose tTWO times in the past month.  She was only just recently admitted in May to the ICU for DKA with respiratory failure and shock liver. Now yesterday she has had an episode of hypoglycemia reportedly to the 20s requiring resuscitation in the ED. I repeatedly asked her if she would be willing to go to a skilled or assisted living facility so that she would have assistance in delivering her insulin. She repeatedly refused. I told her that I am concerned that she may kill herself with her poor diabetes management. I will continue her current management as she has capacity to make her decisions however poor and dangerous they may be. I told her that we would be more than happy to facilitate a referral to an assisted living facility if she chooses to go.

## 2011-12-19 NOTE — Assessment & Plan Note (Signed)
Overall stable. She will likely not live long enough to suffer much atherosclerosis. Prescription for pravastatin given

## 2011-12-25 NOTE — Telephone Encounter (Signed)
CSW will send information about [facility] and benefits they can provide, if she is agreeable to referral.

## 2012-01-09 ENCOUNTER — Ambulatory Visit (INDEPENDENT_AMBULATORY_CARE_PROVIDER_SITE_OTHER): Payer: Medicaid Other | Admitting: Dietician

## 2012-01-09 ENCOUNTER — Other Ambulatory Visit: Payer: Self-pay | Admitting: Dietician

## 2012-01-09 ENCOUNTER — Encounter: Payer: Self-pay | Admitting: Licensed Clinical Social Worker

## 2012-01-09 ENCOUNTER — Ambulatory Visit (INDEPENDENT_AMBULATORY_CARE_PROVIDER_SITE_OTHER): Payer: Medicaid Other | Admitting: Internal Medicine

## 2012-01-09 ENCOUNTER — Encounter: Payer: Self-pay | Admitting: Internal Medicine

## 2012-01-09 ENCOUNTER — Encounter: Payer: Self-pay | Admitting: Dietician

## 2012-01-09 VITALS — BP 111/68 | HR 85 | Temp 97.6°F | Ht 62.0 in | Wt 131.0 lb

## 2012-01-09 DIAGNOSIS — Z124 Encounter for screening for malignant neoplasm of cervix: Secondary | ICD-10-CM

## 2012-01-09 DIAGNOSIS — IMO0002 Reserved for concepts with insufficient information to code with codable children: Secondary | ICD-10-CM

## 2012-01-09 DIAGNOSIS — E1065 Type 1 diabetes mellitus with hyperglycemia: Secondary | ICD-10-CM

## 2012-01-09 DIAGNOSIS — E109 Type 1 diabetes mellitus without complications: Secondary | ICD-10-CM

## 2012-01-09 DIAGNOSIS — Z0000 Encounter for general adult medical examination without abnormal findings: Secondary | ICD-10-CM

## 2012-01-09 DIAGNOSIS — E785 Hyperlipidemia, unspecified: Secondary | ICD-10-CM

## 2012-01-09 DIAGNOSIS — Z79899 Other long term (current) drug therapy: Secondary | ICD-10-CM

## 2012-01-09 DIAGNOSIS — F99 Mental disorder, not otherwise specified: Secondary | ICD-10-CM

## 2012-01-09 DIAGNOSIS — H409 Unspecified glaucoma: Secondary | ICD-10-CM

## 2012-01-09 DIAGNOSIS — F489 Nonpsychotic mental disorder, unspecified: Secondary | ICD-10-CM

## 2012-01-09 LAB — GLUCOSE, CAPILLARY: Glucose-Capillary: 82 mg/dL (ref 70–99)

## 2012-01-09 MED ORDER — INSULIN LISPRO 100 UNIT/ML ~~LOC~~ SOLN: 5.0000 [IU] | Freq: Three times a day (TID) | SUBCUTANEOUS | Status: DC

## 2012-01-09 MED ORDER — LATANOPROST 0.005 % OP SOLN: 1.0000 [drp] | Freq: Every day | OPHTHALMIC | Status: DC

## 2012-01-09 MED ORDER — GLUCOSE BLOOD VI STRP: ORAL_STRIP | Status: DC

## 2012-01-09 MED ORDER — ACCU-CHEK FASTCLIX LANCETS MISC: 1.0000 | Freq: Four times a day (QID) | Status: DC

## 2012-01-09 NOTE — Progress Notes (Signed)
  Subjective:    Patient ID: [name], female    DOB: 1966-02-07, 46 y.o.   MRN: [id number]  HPI  Please see the A&P for the status of the pt's chronic medical problems.   Review of Systems  Neurological: Positive for tremors. Negative for weakness.       No falls  Psychiatric/Behavioral: Negative for behavioral problems and dysphoric mood. The patient is not nervous/anxious.        Objective:   Physical Exam  Constitutional: She is oriented to person, place, and time. No distress.       Chronically ill appearing.  HENT:  Head: Normocephalic and atraumatic.  Right Ear: External ear normal.  Left Ear: External ear normal.  Nose: Nose normal.  Eyes: Conjunctivae are normal. Pupils are equal, round, and reactive to light.  Cardiovascular: Normal rate, regular rhythm and normal heart sounds.   Pulmonary/Chest: Effort normal and breath sounds normal.  Musculoskeletal: Normal range of motion. She exhibits no edema and no tenderness.  Neurological: She is alert and oriented to person, place, and time.       Resting tremor R hand > L foot > L hand. No sig intention tremor. Rapid alternating movements (esp touching fingers to thumb) impaired 2/2 tremor.Able to stand without using arms. Shuffling gait initially but with stand and walk gait was improved and nl. No tremor of face. No pill rolling. Strength 5/5 through out. Speech clear.   Skin: Skin is warm and dry.  Psychiatric: She has a normal mood and affect. Her behavior is normal. Judgment and thought content normal.          Assessment & Plan:

## 2012-01-09 NOTE — Assessment & Plan Note (Addendum)
Pt has not seen pysch on over one year. Per pt, she cont to get refills of risperidone from pysch. Has been on it for over 2 yrs. Now with sig parkinsonism like tremor for 2 years, worse rencently. I reviewed our records an it is hard to decipher but appears we only refilled this med once and it was likely after a D/C. Risperidone can cause tremor / parkinsonism like in 25% of adults. She has pysch tele number at home and I told her she needs to call them today and stressed the importance.   Check Depakote level next visit.

## 2012-01-09 NOTE — Progress Notes (Signed)
CSW met with pt and female friend briefly after CDE appointment.  CSW discussed [facility] referral, pt in agreement with referral.  Referral faxed with updated phone number for patient.  CSW provided Ms. Aust and her female friend, caregiver, with 2 one way bus passes.  Pt in need of transportation.  CSW provided pt with information on Medicaid Medical [employer] and [employer].

## 2012-01-09 NOTE — Progress Notes (Signed)
Diabetes Self Management Training:  Appt start time: 1030 end time:  1100.  Assessment:  Primary concerns today: Blood sugar control.  Support: Patient here with friend and caretaker, Casimiro Needle. Medications:  He and Rionna report that patient has been taking 2 injections each day: 8-14 units of Lantus at bedtime and 15 units of Humalog each morning. They have been writing everything down, but left the record at home today.  Self Monitoring: Meter download with fasting hypoglycemia 5/14 days in past 2 weeks and 4/14 days premeal at lunch. 15.7% of readings <70, pt checking blood sugars 3-4 times a day.  Meal planning: Usual eating pattern includes 3 meals and 1-3 snacks per day. Physical activity includes Activities of daily living.  Progress Towards Goal(s):  In progress.   Educational Plan:   Nutrition Medication Self Monitoring Acute Complications   -2.2 Altered nutrition-related laboratory As related to Type 1 diabetes and the need to match rapid acting insulin to carb intake.  As evidenced by Patient's meter download and high a1C.    Interventions today:   1- Medication:  about action of insulin and need to match this with effects of food on blood sugar. 2- Nutrition education about carb consistency began today, handout provided. 3- self monitoring: target numbers and  how to record information in logbook 4- Coordination of care- discussed patient with her physician. Recommend set insulin doses 4 times a day 5- patient and friend Casimiro Needle given glucose tablets and information on how to handle hypoglycemia.   Monitoring/Evaluation:  Dietary intake, exercise, blood sugars, and body weight in 4 week(s).

## 2012-01-09 NOTE — Addendum Note (Signed)
Addended by: Maura Crandall on: 01/09/2012 11:28 AM   Modules accepted: Orders

## 2012-01-09 NOTE — Telephone Encounter (Signed)
Patient requested testing supply prescription at today's visit.

## 2012-01-09 NOTE — Assessment & Plan Note (Signed)
Last LDL was 9 months ago and was too high. Is on statin. There are bigger issues to consider today. Will address next visit.

## 2012-01-09 NOTE — Assessment & Plan Note (Addendum)
Wants PAP done at [facility]. Had referred there in Jan and she no showed. Asked Purnell Shoemaker to see about getting her another appt.   She requested a refill of her Xalatan. Told her I would this once, but future refills need to come from Optho. I have put in a referral. Last exam was Aug 2012.

## 2012-01-09 NOTE — Assessment & Plan Note (Addendum)
Pt brought log book. CBG's are all over the place. Lantus 8 QHS. Novolog 15 AM and 8 PM. PT states understands carbs and is counting. Eats breakfast, lunch , and dinner. Never skips a meal but sometimes eats less bc she has no appetite. She had a cookie in her hand bc CBG was 80 something and with the tremor, they thought she was hypoglycemic.  Requests eye referral. Also needs microalb as not on ACE.   She is working with [name] right now. Plan is to keep Lantus at 8 and change Novolog to 5 TID with meals.

## 2012-01-09 NOTE — Patient Instructions (Signed)
See [name]

## 2012-01-10 LAB — MICROALBUMIN / CREATININE URINE RATIO
Creatinine, Urine: 228.6 mg/dL
Microalb Creat Ratio: 9.8 mg/g (ref 0.0–30.0)

## 2012-01-10 NOTE — Telephone Encounter (Signed)
Rx called in to pharmacy RA/RR. Stanton Kidney Lakiyah Arntson RN 01/10/12 10AM

## 2012-01-16 ENCOUNTER — Telehealth: Payer: Self-pay | Admitting: Dietician

## 2012-01-16 NOTE — Telephone Encounter (Signed)
Spoke with patient friend, [name]: he reports that [name] has had no low blood sugars, her blood sugars fluctuate, but mostly around 200s and 300s  He has her give mostly 7-8 units of humalog three times daily with meals: more when her blood sugar is higher, less when her blood sugar is lower. He is writing down what they are doing to bring to next visit.   Note no visit scheduled. Will ask front office to schedule appointment with CDE and [name].

## 2012-01-16 NOTE — Telephone Encounter (Signed)
Agree  Thank you

## 2012-02-01 ENCOUNTER — Telehealth: Payer: Self-pay | Admitting: Dietician

## 2012-02-01 NOTE — Telephone Encounter (Signed)
Informed friend of appointments on 02-20-12 with [name] and CDE. He verbalized understanding and agreement. [name] reports [name]'s blood sugars are doing fine. No needs identified at this time.

## 2012-02-06 NOTE — Addendum Note (Signed)
Addended by: Neomia Dear on: 02/06/2012 05:13 AM   Modules accepted: Orders

## 2012-02-08 ENCOUNTER — Encounter: Payer: Medicaid Other | Admitting: Obstetrics & Gynecology

## 2012-02-08 NOTE — Addendum Note (Signed)
Addended by: Neomia Dear on: 02/08/2012 07:13 PM   Modules accepted: Orders

## 2012-02-20 ENCOUNTER — Ambulatory Visit: Payer: Medicaid Other | Admitting: Internal Medicine

## 2012-02-20 ENCOUNTER — Encounter: Payer: Medicaid Other | Admitting: Dietician

## 2012-03-01 ENCOUNTER — Telehealth: Payer: Self-pay | Admitting: Dietician

## 2012-03-01 NOTE — Telephone Encounter (Signed)
Patient left message on voicemail that was difficult to understand. Returned call : She is requesting we call a Lawyer named Steva Ready [phone] ( who she reports has been trying to get in touch with [place]) to verify she has diabetes and why she ate candy about a month ago for a low blood sugar.   Informed patient I will look into if we are able to make such a call on her behalf. She also mentions that she doesn't take insulin when her blood sugars are low, Encouraged her to take half of her usual dose rather than no insulin. She verbalized understanding.

## 2012-03-04 NOTE — Telephone Encounter (Signed)
Called patient's home phone and left message for return call. Mailed letter asking her to have her lawyer call us if needed.

## 2012-03-19 ENCOUNTER — Encounter: Payer: Self-pay | Admitting: Licensed Clinical Social Worker

## 2012-03-19 ENCOUNTER — Encounter: Payer: Self-pay | Admitting: Internal Medicine

## 2012-03-19 ENCOUNTER — Ambulatory Visit (INDEPENDENT_AMBULATORY_CARE_PROVIDER_SITE_OTHER): Payer: Medicaid Other | Admitting: Internal Medicine

## 2012-03-19 VITALS — BP 138/81 | HR 80 | Temp 97.1°F | Ht 61.0 in | Wt 126.8 lb

## 2012-03-19 DIAGNOSIS — E785 Hyperlipidemia, unspecified: Secondary | ICD-10-CM

## 2012-03-19 DIAGNOSIS — F489 Nonpsychotic mental disorder, unspecified: Secondary | ICD-10-CM

## 2012-03-19 DIAGNOSIS — Z0000 Encounter for general adult medical examination without abnormal findings: Secondary | ICD-10-CM

## 2012-03-19 DIAGNOSIS — F1910 Other psychoactive substance abuse, uncomplicated: Secondary | ICD-10-CM

## 2012-03-19 DIAGNOSIS — Z79899 Other long term (current) drug therapy: Secondary | ICD-10-CM

## 2012-03-19 DIAGNOSIS — F99 Mental disorder, not otherwise specified: Secondary | ICD-10-CM

## 2012-03-19 DIAGNOSIS — E1065 Type 1 diabetes mellitus with hyperglycemia: Secondary | ICD-10-CM

## 2012-03-19 LAB — COMPLETE METABOLIC PANEL WITH GFR
ALT: 8 U/L (ref 0–35)
AST: 14 U/L (ref 0–37)
Albumin: 4.7 g/dL (ref 3.5–5.2)
BUN: 14 mg/dL (ref 6–23)
Calcium: 9.4 mg/dL (ref 8.4–10.5)
Chloride: 107 mEq/L (ref 96–112)
GFR, Est Non African American: >89 mL/min
Glucose, Bld: 100 mg/dL — ABNORMAL HIGH (ref 70–99)
Potassium: 3.6 mEq/L (ref 3.5–5.3)
Sodium: 142 mEq/L (ref 135–145)
Total Protein: 8.6 g/dL — ABNORMAL HIGH (ref 6.0–8.3)

## 2012-03-19 LAB — LIPID PANEL
HDL: 71 mg/dL (ref >39)
VLDL: 24 mg/dL (ref 0–40)

## 2012-03-19 LAB — GLUCOSE, CAPILLARY: Glucose-Capillary: 127 mg/dL — ABNORMAL HIGH (ref 70–99)

## 2012-03-19 LAB — POCT GLYCOSYLATED HEMOGLOBIN (HGB A1C): Hemoglobin A1C: 8.4

## 2012-03-19 NOTE — Assessment & Plan Note (Signed)
Pt last snorted cocaine about 2 weeks ago. Denied ETOH use

## 2012-03-19 NOTE — Patient Instructions (Signed)
[name] will see you today to help you make an appt with your mental health doctor.

## 2012-03-19 NOTE — Assessment & Plan Note (Signed)
Check FLP panel as last checked 03/2011 and LDL was controlled at 61 on her statin.

## 2012-03-19 NOTE — Assessment & Plan Note (Addendum)
She does an amazing job checking her CBG's - checks 3.4 times daily. However, her CBG's are all over the place - from 11% less than 50 and almost 60% greater than 180 with many above 350. There are lows throughout the day. Her CBG was 50 last PM bc she didn't eat well. She was eating cookies when I came into the room bc her sugar was "low" at 194. There is very little I can do until she is ready to make intensive lifestyle changes which she is not ready (not capable??) to do. Even when she was in SNF, we could not get her DM controlled bc she would sign herself out and not be available for insulin injections and CBG checks.   She no showed or cancelled her last two optho appt. Will try to resch again.

## 2012-03-19 NOTE — Assessment & Plan Note (Signed)
She wanted referral to Gyn but no showed the appt. Needs PAP though.

## 2012-03-19 NOTE — Progress Notes (Signed)
CSW met with Ms. Scinto following her scheduled [facility] appt.  Pt has yet to connect with [facility].  Note from [facility] last contact was 02/09/12, multiple attempts but pt has not responded.  CSW discussed this with Ms. Boylen.  Jeannia provided CSW with alternate number to her mother and states mother can assist with setting up meeting time.  Ms. Rayos states she was transported today via [employer].  This demonstrates Ms. Baroni did follow through with arranging transportation.  Pt inquired if M/A transport assist with mental health appt's.  CSW informed Ms. Claybrook m/a transport assists with all health related appt's, including her upcoming eye appt.  CSW discussed the need to have ROI signed to assist CSW with inquiring with local mental health agencies to attempt to find Ms. Bocanegra psychiatry.  Pt signed ROI for [facility] and [facility].  CSW sent request to [facility] to make attempt to reach pt again.  CSW will contact [facility] to inquire if Ms. Beddow has been seen there in the past.   CSW faxed ROI and request to [facility] and left message with Medical Records, await response and resume care if pt is prior pt.

## 2012-03-19 NOTE — Progress Notes (Signed)
  Subjective:    Patient ID: [name], female    DOB: 05-22-1966, 46 y.o.   MRN: [id number]  HPI  Please see the A&P for the status of the pt's chronic medical problems.  Review of Systems  Constitutional: Negative for appetite change and unexpected weight change.  HENT: Negative for rhinorrhea, sneezing and sinus pressure.   Eyes: Negative for itching.  Respiratory: Negative for shortness of breath.   Cardiovascular: Negative for chest pain.  Gastrointestinal: Negative for nausea, vomiting and diarrhea.  Skin: Positive for rash.  Neurological: Negative for dizziness, tremors, light-headedness and headaches.       Objective:   Physical Exam  Constitutional: She is oriented to person, place, and time.  Neurological: She is alert and oriented to person, place, and time.       No tremor. Finger to nose adequate. Slight missing of object with L finger  Skin: Skin is warm and dry. Rash noted. No erythema.       Linear excoriated areas on arms B  Rest of PE deferred as pt has self reported bed bugs. Exam nl last appt        Assessment & Plan:

## 2012-03-19 NOTE — Assessment & Plan Note (Signed)
Pt was told to make appt last visit with her mental health provider bx she was having Parkinson's like sxs that could have been 2/2 her resperidone. She did not make an appt. States the tremors went away one week after our last appt. I asked Edson Snowball is she could help to make appt with pysch as I feel she really needs it if she is continuing to take the Resperidone like she told me she is. She states she is not taking the Depakote but I went ahead and checked a level as I do not think with this issue, she is a reliable historian.   Edson Snowball is also assisting with communication with [facility] - would be good to have additional resources - for mental health, DM, and safety net. Edson Snowball also pointed out that she can use medicaid transport to optho appt and mental health appts!

## 2012-03-20 ENCOUNTER — Encounter: Payer: Self-pay | Admitting: Internal Medicine

## 2012-03-20 ENCOUNTER — Other Ambulatory Visit: Payer: Self-pay | Admitting: Internal Medicine

## 2012-03-20 ENCOUNTER — Telehealth: Payer: Self-pay | Admitting: Licensed Clinical Social Worker

## 2012-03-20 LAB — VALPROIC ACID LEVEL: Valproic Acid Lvl: <1 ug/mL — ABNORMAL LOW (ref 50.0–100.0)

## 2012-03-20 MED ORDER — PRAVASTATIN SODIUM 80 MG PO TABS: 80.0000 mg | ORAL_TABLET | Freq: Every day | ORAL | Status: DC

## 2012-03-20 NOTE — Telephone Encounter (Signed)
CSW placed call to [facility] after ROI was faxed.  [facility] states pt is not established with them.  CSW placed call to [facility] [place], left message to schedule pt appt with therapy/psychiatry.

## 2012-03-25 NOTE — Telephone Encounter (Signed)
Pt is not established with [facility] [place] and is unable to schedule directly with psychiatrist, Ms. Lua will need to use walk-in hours.  CSW will attempt to referral to agency on [facility] referral listing.

## 2012-03-29 ENCOUNTER — Telehealth: Payer: Self-pay | Admitting: Licensed Clinical Social Worker

## 2012-03-29 NOTE — Telephone Encounter (Signed)
Pt is not established with a mental health facility and it is doubtful pt would follow through on attending a walk-in clinic and wait to be seen to establish care.  CSW would like to refer to [facility] for community - in home support and case management.  CSW placed call to pt's number, female friend answer and states he will have pt return call to CSW will he gets home.  CSW will await call from pt to receive permission to refer to [facility].  CSW has asked [facility] to continue to attempt to contact [name].

## 2012-04-01 NOTE — Telephone Encounter (Signed)
CSW placed call to [name], unable to leave message at either numbers.

## 2012-04-02 NOTE — Telephone Encounter (Signed)
Ms. Stafford left voice mail for CSW on 9/16 after CSW hours.  CSW returned call, left message stating CSW wanted to make new referral.  Left message with hours and contact number.

## 2012-04-03 NOTE — Telephone Encounter (Signed)
Letter Mailed

## 2012-05-06 ENCOUNTER — Telehealth: Payer: Self-pay | Admitting: *Deleted

## 2012-05-06 NOTE — Telephone Encounter (Signed)
Yes, I agree with you. A female came to her last [facility] appt with me and stated he was her "aide" but he was not through an agency, was not certified, it was really sketchy. And she is very vulnerable. Thanks

## 2012-05-06 NOTE — Telephone Encounter (Signed)
A female calls and states he is pt's boyfriend and he would like to discuss her care or her mother would like to also, i advised that pt has no one listed as a person that care can be discussed with and that would need to be changed due to hippa, he was not happy and stated he was well versed on hippa but hoped i would make an exception. i asked him to have the pt come in and make the changes on her chart, he said ok.

## 2012-05-07 NOTE — Telephone Encounter (Signed)
I would like to make a referral to [employer], community support team that would benefit [name].  However, I have been unable to reach her.  I have sent letter with my hours available.  I would like pt to have an [facility] visit to discuss referral to a community support team if appropriate.

## 2012-05-20 ENCOUNTER — Inpatient Hospital Stay (HOSPITAL_COMMUNITY): Payer: Medicaid Other

## 2012-05-20 ENCOUNTER — Inpatient Hospital Stay (HOSPITAL_COMMUNITY)
Admission: EM | Admit: 2012-05-20 | Discharge: 2012-05-29 | DRG: 004 | Disposition: A | Discharge status: Skilled Nursing Facility | Payer: Medicaid Other | Attending: Otolaryngology | Admitting: Otolaryngology

## 2012-05-20 ENCOUNTER — Emergency Department (HOSPITAL_COMMUNITY): Payer: Medicaid Other

## 2012-05-20 ENCOUNTER — Encounter (HOSPITAL_COMMUNITY): Payer: Self-pay | Admitting: *Deleted

## 2012-05-20 DIAGNOSIS — F99 Mental disorder, not otherwise specified: Secondary | ICD-10-CM

## 2012-05-20 DIAGNOSIS — B86 Scabies: Secondary | ICD-10-CM | POA: Diagnosis present

## 2012-05-20 DIAGNOSIS — IMO0002 Reserved for concepts with insufficient information to code with codable children: Secondary | ICD-10-CM | POA: Diagnosis present

## 2012-05-20 DIAGNOSIS — E1065 Type 1 diabetes mellitus with hyperglycemia: Secondary | ICD-10-CM

## 2012-05-20 DIAGNOSIS — F79 Unspecified intellectual disabilities: Secondary | ICD-10-CM | POA: Diagnosis present

## 2012-05-20 DIAGNOSIS — F17200 Nicotine dependence, unspecified, uncomplicated: Secondary | ICD-10-CM | POA: Diagnosis present

## 2012-05-20 DIAGNOSIS — R220 Localized swelling, mass and lump, head: Secondary | ICD-10-CM

## 2012-05-20 DIAGNOSIS — T405X1A Poisoning by cocaine, accidental (unintentional), initial encounter: Principal | ICD-10-CM | POA: Diagnosis present

## 2012-05-20 DIAGNOSIS — K209 Esophagitis, unspecified without bleeding: Secondary | ICD-10-CM | POA: Diagnosis present

## 2012-05-20 DIAGNOSIS — Z8639 Personal history of other endocrine, nutritional and metabolic disease: Secondary | ICD-10-CM

## 2012-05-20 DIAGNOSIS — E109 Type 1 diabetes mellitus without complications: Secondary | ICD-10-CM | POA: Diagnosis present

## 2012-05-20 DIAGNOSIS — Z930 Tracheostomy status: Secondary | ICD-10-CM

## 2012-05-20 DIAGNOSIS — E119 Type 2 diabetes mellitus without complications: Secondary | ICD-10-CM

## 2012-05-20 DIAGNOSIS — J984 Other disorders of lung: Secondary | ICD-10-CM | POA: Diagnosis not present

## 2012-05-20 DIAGNOSIS — T43601A Poisoning by unspecified psychostimulants, accidental (unintentional), initial encounter: Secondary | ICD-10-CM | POA: Diagnosis present

## 2012-05-20 DIAGNOSIS — I10 Essential (primary) hypertension: Secondary | ICD-10-CM | POA: Diagnosis present

## 2012-05-20 DIAGNOSIS — F1010 Alcohol abuse, uncomplicated: Secondary | ICD-10-CM | POA: Diagnosis present

## 2012-05-20 DIAGNOSIS — E785 Hyperlipidemia, unspecified: Secondary | ICD-10-CM | POA: Diagnosis present

## 2012-05-20 DIAGNOSIS — F319 Bipolar disorder, unspecified: Secondary | ICD-10-CM | POA: Diagnosis present

## 2012-05-20 DIAGNOSIS — R339 Retention of urine, unspecified: Secondary | ICD-10-CM | POA: Diagnosis not present

## 2012-05-20 DIAGNOSIS — D638 Anemia in other chronic diseases classified elsewhere: Secondary | ICD-10-CM | POA: Diagnosis present

## 2012-05-20 DIAGNOSIS — R0603 Acute respiratory distress: Secondary | ICD-10-CM

## 2012-05-20 DIAGNOSIS — Z7982 Long term (current) use of aspirin: Secondary | ICD-10-CM

## 2012-05-20 DIAGNOSIS — J384 Edema of larynx: Secondary | ICD-10-CM | POA: Diagnosis present

## 2012-05-20 DIAGNOSIS — F1911 Other psychoactive substance abuse, in remission: Secondary | ICD-10-CM | POA: Diagnosis present

## 2012-05-20 DIAGNOSIS — J988 Other specified respiratory disorders: Secondary | ICD-10-CM

## 2012-05-20 DIAGNOSIS — I2510 Atherosclerotic heart disease of native coronary artery without angina pectoris: Secondary | ICD-10-CM | POA: Diagnosis present

## 2012-05-20 DIAGNOSIS — Z79899 Other long term (current) drug therapy: Secondary | ICD-10-CM

## 2012-05-20 DIAGNOSIS — D509 Iron deficiency anemia, unspecified: Secondary | ICD-10-CM | POA: Diagnosis present

## 2012-05-20 DIAGNOSIS — I959 Hypotension, unspecified: Secondary | ICD-10-CM

## 2012-05-20 DIAGNOSIS — E872 Acidosis, unspecified: Secondary | ICD-10-CM | POA: Diagnosis present

## 2012-05-20 DIAGNOSIS — F1910 Other psychoactive substance abuse, uncomplicated: Secondary | ICD-10-CM

## 2012-05-20 DIAGNOSIS — R221 Localized swelling, mass and lump, neck: Secondary | ICD-10-CM

## 2012-05-20 DIAGNOSIS — F1410 Cocaine abuse, uncomplicated: Secondary | ICD-10-CM | POA: Diagnosis present

## 2012-05-20 DIAGNOSIS — R1310 Dysphagia, unspecified: Secondary | ICD-10-CM | POA: Diagnosis present

## 2012-05-20 DIAGNOSIS — J040 Acute laryngitis: Secondary | ICD-10-CM | POA: Diagnosis present

## 2012-05-20 DIAGNOSIS — R739 Hyperglycemia, unspecified: Secondary | ICD-10-CM

## 2012-05-20 DIAGNOSIS — R061 Stridor: Secondary | ICD-10-CM

## 2012-05-20 DIAGNOSIS — Z794 Long term (current) use of insulin: Secondary | ICD-10-CM

## 2012-05-20 LAB — CBC WITH DIFFERENTIAL/PLATELET
Basophils Absolute: 0 10*3/uL (ref 0.0–0.1)
Basophils Relative: 0 % (ref 0–1)
Eosinophils Absolute: 0 10*3/uL (ref 0.0–0.7)
Hemoglobin: 10.8 g/dL — ABNORMAL LOW (ref 12.0–15.0)
Lymphocytes Relative: 27 % (ref 12–46)
MCHC: 31.6 g/dL (ref 30.0–36.0)
Monocytes Relative: 6 % (ref 3–12)
Neutrophils Relative %: 67 % (ref 43–77)
WBC: 7.6 10*3/uL (ref 4.0–10.5)

## 2012-05-20 LAB — RAPID STREP SCREEN (MED CTR MEBANE ONLY): Streptococcus, Group A Screen (Direct): NEGATIVE

## 2012-05-20 LAB — IRON AND TIBC
Iron: 37 ug/dL — ABNORMAL LOW (ref 42–135)
Saturation Ratios: 13 % — ABNORMAL LOW (ref 20–55)
TIBC: 291 ug/dL (ref 250–470)

## 2012-05-20 LAB — URINALYSIS, MICROSCOPIC ONLY
Hgb urine dipstick: NEGATIVE
Protein, ur: NEGATIVE mg/dL
Urobilinogen, UA: 0.2 mg/dL (ref 0.0–1.0)

## 2012-05-20 LAB — CBC
Hemoglobin: 10.4 g/dL — ABNORMAL LOW (ref 12.0–15.0)
MCH: 19.9 pg — ABNORMAL LOW (ref 26.0–34.0)
MCHC: 31.9 g/dL (ref 30.0–36.0)
MCV: 62.3 fL — ABNORMAL LOW (ref 78.0–100.0)
Platelets: ADEQUATE 10*3/uL (ref 150–400)

## 2012-05-20 LAB — GLUCOSE, CAPILLARY: Glucose-Capillary: 339 mg/dL — ABNORMAL HIGH (ref 70–99)

## 2012-05-20 LAB — COMPREHENSIVE METABOLIC PANEL
BUN: 9 mg/dL (ref 6–23)
CO2: 22 mEq/L (ref 19–32)
Chloride: 98 mEq/L (ref 96–112)
Creatinine, Ser: 0.62 mg/dL (ref 0.50–1.10)
GFR calc Af Amer: >90 mL/min (ref >90)
GFR calc non Af Amer: >90 mL/min (ref >90)
Total Bilirubin: 0.2 mg/dL — ABNORMAL LOW (ref 0.3–1.2)

## 2012-05-20 LAB — BASIC METABOLIC PANEL
Calcium: 8.1 mg/dL — ABNORMAL LOW (ref 8.4–10.5)
Creatinine, Ser: 0.57 mg/dL (ref 0.50–1.10)
GFR calc non Af Amer: >90 mL/min (ref >90)
Glucose, Bld: 234 mg/dL — ABNORMAL HIGH (ref 70–99)
Sodium: 134 mEq/L — ABNORMAL LOW (ref 135–145)

## 2012-05-20 LAB — RAPID URINE DRUG SCREEN, HOSP PERFORMED: Barbiturates: NOT DETECTED

## 2012-05-20 LAB — RETICULOCYTES: Retic Ct Pct: 0.8 % (ref 0.4–3.1)

## 2012-05-20 LAB — SEDIMENTATION RATE: Sed Rate: 17 mm/hr (ref 0–22)

## 2012-05-20 LAB — TSH: TSH: 2.106 u[IU]/mL (ref 0.350–4.500)

## 2012-05-20 LAB — CREATININE, SERUM: Creatinine, Ser: 0.61 mg/dL (ref 0.50–1.10)

## 2012-05-20 LAB — PROTIME-INR: Prothrombin Time: 14 seconds (ref 11.6–15.2)

## 2012-05-20 LAB — VITAMIN B12: Vitamin B-12: 837 pg/mL (ref 211–911)

## 2012-05-20 MED ORDER — INSULIN ASPART 100 UNIT/ML ~~LOC~~ SOLN
0.0000 [IU] | SUBCUTANEOUS | Status: DC
  Administered 2012-05-20: 3 [IU] via SUBCUTANEOUS
  Administered 2012-05-20: 1 [IU] via SUBCUTANEOUS
  Administered 2012-05-20: 3 [IU] via SUBCUTANEOUS
  Administered 2012-05-20: 9 [IU] via SUBCUTANEOUS
  Administered 2012-05-21 (×3): 1 [IU] via SUBCUTANEOUS
  Administered 2012-05-21: 5 [IU] via SUBCUTANEOUS
  Administered 2012-05-22 (×2): 2 [IU] via SUBCUTANEOUS
  Administered 2012-05-22: 7 [IU] via SUBCUTANEOUS
  Administered 2012-05-23 (×2): 2 [IU] via SUBCUTANEOUS
  Administered 2012-05-23: 1 [IU] via SUBCUTANEOUS
  Administered 2012-05-23: 3 [IU] via SUBCUTANEOUS
  Administered 2012-05-23: 1 [IU] via SUBCUTANEOUS
  Administered 2012-05-24: 5 [IU] via SUBCUTANEOUS
  Administered 2012-05-24: 3 [IU] via SUBCUTANEOUS
  Administered 2012-05-24 (×2): 5 [IU] via SUBCUTANEOUS
  Administered 2012-05-25: 2 [IU] via SUBCUTANEOUS
  Administered 2012-05-25: 7 [IU] via SUBCUTANEOUS
  Filled 2012-05-20: qty 1

## 2012-05-20 MED ORDER — CEFTRIAXONE SODIUM 1 G IJ SOLR
1.0000 g | INTRAMUSCULAR | Status: DC
  Administered 2012-05-20: 1 g via INTRAVENOUS
  Filled 2012-05-20 (×3): qty 10

## 2012-05-20 MED ORDER — METHYLPREDNISOLONE SODIUM SUCC 125 MG IJ SOLR
125.0000 mg | Freq: Once | INTRAMUSCULAR | Status: AC
  Administered 2012-05-20: 125 mg via INTRAVENOUS
  Filled 2012-05-20: qty 2

## 2012-05-20 MED ORDER — ONDANSETRON HCL 4 MG/2ML IJ SOLN: 4.0000 mg | Freq: Once | INTRAMUSCULAR | Status: DC

## 2012-05-20 MED ORDER — LORAZEPAM 2 MG/ML IJ SOLN
INTRAMUSCULAR | Status: AC
  Administered 2012-05-20: 0.5 mg via INTRAVENOUS
  Filled 2012-05-20: qty 1

## 2012-05-20 MED ORDER — SODIUM CHLORIDE 0.9 % IV BOLUS (SEPSIS)
1000.0000 mL | Freq: Once | INTRAVENOUS | Status: AC
  Administered 2012-05-20: 1000 mL via INTRAVENOUS

## 2012-05-20 MED ORDER — ALBUTEROL SULFATE (5 MG/ML) 0.5% IN NEBU
2.5000 mg | INHALATION_SOLUTION | Freq: Once | RESPIRATORY_TRACT | Status: AC
  Administered 2012-05-20: 05:00:00 via RESPIRATORY_TRACT
  Filled 2012-05-20: qty 20

## 2012-05-20 MED ORDER — SODIUM CHLORIDE 0.9 % IJ SOLN
3.0000 mL | Freq: Two times a day (BID) | INTRAMUSCULAR | Status: DC
  Administered 2012-05-20 – 2012-05-28 (×9): 3 mL via INTRAVENOUS

## 2012-05-20 MED ORDER — SODIUM CHLORIDE 0.9 % IV SOLN: Freq: Once | INTRAVENOUS | Status: DC

## 2012-05-20 MED ORDER — RACEPINEPHRINE HCL 2.25 % IN NEBU
INHALATION_SOLUTION | RESPIRATORY_TRACT | Status: AC
  Filled 2012-05-20: qty 0.5

## 2012-05-20 MED ORDER — ENOXAPARIN SODIUM 40 MG/0.4ML ~~LOC~~ SOLN
40.0000 mg | SUBCUTANEOUS | Status: DC
Start: 2012-05-20 — End: 2012-05-29
  Administered 2012-05-20 – 2012-05-26 (×7): 40 mg via SUBCUTANEOUS
  Filled 2012-05-20 (×11): qty 0.4

## 2012-05-20 MED ORDER — IOHEXOL 300 MG/ML  SOLN
75.0000 mL | Freq: Once | INTRAMUSCULAR | Status: AC | PRN
  Administered 2012-05-20: 75 mL via INTRAVENOUS

## 2012-05-20 MED ORDER — INSULIN GLARGINE 100 UNIT/ML ~~LOC~~ SOLN
5.0000 [IU] | Freq: Every day | SUBCUTANEOUS | Status: DC
  Administered 2012-05-20 – 2012-05-25 (×5): 5 [IU] via SUBCUTANEOUS

## 2012-05-20 MED ORDER — SODIUM CHLORIDE 0.9 % IV BOLUS (SEPSIS): 1000.0000 mL | INTRAVENOUS | Status: AC

## 2012-05-20 MED ORDER — SODIUM CHLORIDE 0.9 % IV SOLN
INTRAVENOUS | Status: DC
  Administered 2012-05-20: 16:00:00 via INTRAVENOUS

## 2012-05-20 MED ORDER — LIDOCAINE VISCOUS 2 % MT SOLN: 20.0000 mL | Freq: Once | OROMUCOSAL | Status: DC

## 2012-05-20 MED ORDER — RACEPINEPHRINE HCL 2.25 % IN NEBU
0.5000 mL | INHALATION_SOLUTION | Freq: Once | RESPIRATORY_TRACT | Status: AC
  Administered 2012-05-20: 0.5 mL via RESPIRATORY_TRACT
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
## 2012-05-20 NOTE — Progress Notes (Signed)
No changes in how she feels throughout the afternoon. On exam, she is in no distress. Her voice and breathing are unchanged - she still has mild inspiratory stridor but is moving air well. Recommend we start her on clear liquids and re-evaluate in the morning. When I know that her airway is stable, we will consider esophagoscopy.

## 2012-05-20 NOTE — ED Notes (Addendum)
Here for sore throat, cough and sob. Onset 3d ago. Croupy/barky cough noted. (Denies: nvd, fever or other pains). (Denies: other sx). Also mentions "bed bugs have been biting her". "Takes only insulin". 5 mg albuterol neb given by EMS PTA. LS CTA on arrival. SPO2 100%. Hoarse voice. Alert, NAD, calm, interactive, ambulatory, steady gait, resps e/u, speaking in soft spoke, hoarse voice.

## 2012-05-20 NOTE — Progress Notes (Signed)
Utilization review completed.  

## 2012-05-20 NOTE — ED Notes (Signed)
ENT and admitting MD in room at this time. Pt tolerated laryngoscopy, done by Dr.Rosen, well.

## 2012-05-20 NOTE — ED Notes (Signed)
Admitting MD notified of BS. Insulin given per MD orders

## 2012-05-20 NOTE — ED Notes (Signed)
pty remains very croupe after racemic epinephrine albuteral neb started BP rechecked manual and is WNL Dr Preston Fleeting notified

## 2012-05-20 NOTE — Progress Notes (Signed)
Pt admitted to 3300 from ED.  Oriented to unit, room, and call bell - placed at pt's side.  VSS.  Peggi Yono, RN  

## 2012-05-20 NOTE — ED Notes (Signed)
CBG checked 339

## 2012-05-20 NOTE — Progress Notes (Signed)
Paged md to get diet order and verify ivf order. Ordererd to give 1L NS bolus and then start fluids at 150cc/hr. Will give bolus and continue to monitor.

## 2012-05-20 NOTE — ED Provider Notes (Signed)
10:14 AM D/w Dr. Pollyann Kennedy, ENT- he is planning to come to the ED to perform scope and further evaluate this mass.  Also d/w [facility] resident who will come to the ED to evaluate patient as well due to other comorbidities.   Ethelda Chick, MD 05/20/12 1149
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
## 2012-05-20 NOTE — ED Notes (Signed)
Pt to CT on 3L O2 [place]

## 2012-05-21 ENCOUNTER — Encounter (HOSPITAL_COMMUNITY): Payer: Self-pay | Admitting: Anesthesiology

## 2012-05-21 ENCOUNTER — Inpatient Hospital Stay (HOSPITAL_COMMUNITY): Payer: Medicaid Other | Admitting: Anesthesiology

## 2012-05-21 ENCOUNTER — Encounter (HOSPITAL_COMMUNITY)
Admission: EM | Disposition: A | Discharge status: Skilled Nursing Facility | Payer: Self-pay | Source: Home / Self Care | Attending: Otolaryngology

## 2012-05-21 DIAGNOSIS — R1310 Dysphagia, unspecified: Secondary | ICD-10-CM

## 2012-05-21 DIAGNOSIS — J988 Other specified respiratory disorders: Secondary | ICD-10-CM

## 2012-05-21 DIAGNOSIS — J040 Acute laryngitis: Secondary | ICD-10-CM

## 2012-05-21 DIAGNOSIS — K209 Esophagitis, unspecified without bleeding: Secondary | ICD-10-CM

## 2012-05-21 DIAGNOSIS — J984 Other disorders of lung: Secondary | ICD-10-CM

## 2012-05-21 DIAGNOSIS — R061 Stridor: Secondary | ICD-10-CM

## 2012-05-21 DIAGNOSIS — E1065 Type 1 diabetes mellitus with hyperglycemia: Secondary | ICD-10-CM

## 2012-05-21 DIAGNOSIS — E109 Type 1 diabetes mellitus without complications: Secondary | ICD-10-CM

## 2012-05-21 HISTORY — PX: ESOPHAGOSCOPY: SHX5534

## 2012-05-21 HISTORY — PX: TRACHEOSTOMY TUBE PLACEMENT: SHX814

## 2012-05-21 HISTORY — DX: Esophagitis, unspecified without bleeding: K20.90

## 2012-05-21 HISTORY — PX: DIRECT LARYNGOSCOPY: SHX5326

## 2012-05-21 LAB — COMPREHENSIVE METABOLIC PANEL
ALT: <5 U/L (ref 0–35)
Alkaline Phosphatase: 67 U/L (ref 39–117)
BUN: 8 mg/dL (ref 6–23)
Chloride: 107 mEq/L (ref 96–112)
GFR calc Af Amer: >90 mL/min (ref >90)
Glucose, Bld: 111 mg/dL — ABNORMAL HIGH (ref 70–99)
Potassium: 3.5 mEq/L (ref 3.5–5.1)
Total Bilirubin: 0.2 mg/dL — ABNORMAL LOW (ref 0.3–1.2)

## 2012-05-21 LAB — GLUCOSE, CAPILLARY
Glucose-Capillary: 169 mg/dL — ABNORMAL HIGH (ref 70–99)
Glucose-Capillary: 276 mg/dL — ABNORMAL HIGH (ref 70–99)
Glucose-Capillary: 134 mg/dL — ABNORMAL HIGH (ref 70–99)
Glucose-Capillary: 140 mg/dL — ABNORMAL HIGH (ref 70–99)

## 2012-05-21 LAB — CBC
MCHC: 31.1 g/dL (ref 30.0–36.0)
MCV: 61.7 fL — ABNORMAL LOW (ref 78.0–100.0)
Platelets: 279 10*3/uL (ref 150–400)
RDW: 16.1 % — ABNORMAL HIGH (ref 11.5–15.5)
WBC: 8 10*3/uL (ref 4.0–10.5)

## 2012-05-21 LAB — URINE CULTURE: Culture: NO GROWTH

## 2012-05-21 SURGERY — LARYNGOSCOPY, DIRECT
Anesthesia: General | Wound class: Clean Contaminated

## 2012-05-21 MED ORDER — PANTOPRAZOLE SODIUM 40 MG IV SOLR
40.0000 mg | Freq: Two times a day (BID) | INTRAVENOUS | Status: DC
  Administered 2012-05-21 – 2012-05-28 (×14): 40 mg via INTRAVENOUS
  Filled 2012-05-21 (×20): qty 40

## 2012-05-21 MED ORDER — LACTATED RINGERS IV SOLN
INTRAVENOUS | Status: DC | PRN
  Administered 2012-05-21: 07:00:00 via INTRAVENOUS

## 2012-05-21 MED ORDER — FENTANYL CITRATE 0.05 MG/ML IJ SOLN: 25.0000 ug | INTRAMUSCULAR | Status: DC | PRN

## 2012-05-21 MED ORDER — GLYCOPYRROLATE 0.2 MG/ML IJ SOLN
INTRAMUSCULAR | Status: DC | PRN
  Administered 2012-05-21: 0.6 mg via INTRAVENOUS

## 2012-05-21 MED ORDER — RACEPINEPHRINE HCL 2.25 % IN NEBU: 0.5000 mL | INHALATION_SOLUTION | RESPIRATORY_TRACT | Status: DC | PRN

## 2012-05-21 MED ORDER — NEOSTIGMINE METHYLSULFATE 1 MG/ML IJ SOLN
INTRAMUSCULAR | Status: DC | PRN
  Administered 2012-05-21: 4 mg via INTRAVENOUS

## 2012-05-21 MED ORDER — ROCURONIUM BROMIDE 100 MG/10ML IV SOLN
INTRAVENOUS | Status: DC | PRN
  Administered 2012-05-21: 25 mg via INTRAVENOUS

## 2012-05-21 MED ORDER — EPINEPHRINE HCL (NASAL) 0.1 % NA SOLN
NASAL | Status: AC
  Filled 2012-05-21: qty 30

## 2012-05-21 MED ORDER — FENTANYL CITRATE 0.05 MG/ML IJ SOLN
INTRAMUSCULAR | Status: DC | PRN
  Administered 2012-05-21: 150 ug via INTRAVENOUS
  Administered 2012-05-21: 50 ug via INTRAVENOUS

## 2012-05-21 MED ORDER — KCL IN DEXTROSE-NACL 40-5-0.45 MEQ/L-%-% IV SOLN
INTRAVENOUS | Status: DC
  Administered 2012-05-21: 15:00:00 via INTRAVENOUS
  Administered 2012-05-22: 50 mL/h via INTRAVENOUS
  Administered 2012-05-23: 04:00:00 via INTRAVENOUS
  Administered 2012-05-24: 50 mL/h via INTRAVENOUS
  Administered 2012-05-24 – 2012-05-26 (×3): via INTRAVENOUS
  Filled 2012-05-21 (×13): qty 1000

## 2012-05-21 MED ORDER — LORAZEPAM 2 MG/ML IJ SOLN
0.5000 mg | Freq: Four times a day (QID) | INTRAMUSCULAR | Status: DC | PRN
  Administered 2012-05-24 – 2012-05-27 (×4): 0.5 mg via INTRAVENOUS
  Filled 2012-05-21 (×5): qty 1

## 2012-05-21 MED ORDER — SUCCINYLCHOLINE CHLORIDE 20 MG/ML IJ SOLN
INTRAMUSCULAR | Status: DC | PRN
  Administered 2012-05-21 (×2): 100 mg via INTRAVENOUS

## 2012-05-21 MED ORDER — LIDOCAINE-EPINEPHRINE 1 %-1:100000 IJ SOLN
INTRAMUSCULAR | Status: AC
  Filled 2012-05-21: qty 1

## 2012-05-21 MED ORDER — ONDANSETRON HCL 4 MG/2ML IJ SOLN
INTRAMUSCULAR | Status: DC | PRN
  Administered 2012-05-21: 4 mg via INTRAVENOUS

## 2012-05-21 MED ORDER — METOCLOPRAMIDE HCL 5 MG/ML IJ SOLN: 10.0000 mg | Freq: Once | INTRAMUSCULAR | Status: DC | PRN

## 2012-05-21 MED ORDER — HYDROMORPHONE HCL PF 1 MG/ML IJ SOLN
0.5000 mg | INTRAMUSCULAR | Status: DC | PRN
  Administered 2012-05-21 – 2012-05-22 (×4): 0.5 mg via INTRAVENOUS
  Administered 2012-05-23 – 2012-05-27 (×10): 1 mg via INTRAVENOUS
  Filled 2012-05-21 (×15): qty 1

## 2012-05-21 MED ORDER — PHENYLEPHRINE HCL 10 MG/ML IJ SOLN
INTRAMUSCULAR | Status: DC | PRN
  Administered 2012-05-21 (×3): 80 ug via INTRAVENOUS

## 2012-05-21 MED ORDER — PROPOFOL 10 MG/ML IV BOLUS
INTRAVENOUS | Status: DC | PRN
  Administered 2012-05-21: 200 mg via INTRAVENOUS
  Administered 2012-05-21: 150 mg via INTRAVENOUS

## 2012-05-21 MED ORDER — DEXAMETHASONE SODIUM PHOSPHATE 4 MG/ML IJ SOLN
INTRAMUSCULAR | Status: DC | PRN
  Administered 2012-05-21: 8 mg via INTRAVENOUS

## 2012-05-21 MED ORDER — LIDOCAINE HCL (CARDIAC) 20 MG/ML IV SOLN
INTRAVENOUS | Status: DC | PRN
  Administered 2012-05-21: 50 mg via INTRAVENOUS

## 2012-05-21 MED ORDER — RACEPINEPHRINE HCL 2.25 % IN NEBU
0.5000 mL | INHALATION_SOLUTION | Freq: Once | RESPIRATORY_TRACT | Status: DC
  Filled 2012-05-21: qty 0.5

## 2012-05-21 MED ORDER — MIDAZOLAM HCL 5 MG/5ML IJ SOLN
INTRAMUSCULAR | Status: DC | PRN
  Administered 2012-05-21 (×2): 1 mg via INTRAVENOUS

## 2012-05-21 SURGICAL SUPPLY — 58 items
BALLN PULM 15 16.5 18X75 (BALLOONS)
BALLOON PULM 15 16.5 18X75 (BALLOONS) IMPLANT
BENZOIN TINCTURE PRP APPL 2/3 (GAUZE/BANDAGES/DRESSINGS) IMPLANT
BLADE SURG 11 STRL SS (BLADE) ×2 IMPLANT
BLADE SURG 15 STRL LF DISP TIS (BLADE) ×3 IMPLANT
BLADE SURG 15 STRL SS (BLADE) ×3
BLADE SURG ROTATE 9660 (MISCELLANEOUS) IMPLANT
CANISTER SUCTION 2500CC (MISCELLANEOUS) ×4 IMPLANT
CLEANER TIP ELECTROSURG 2X2 (MISCELLANEOUS) ×4 IMPLANT
CLOTH BEACON ORANGE TIMEOUT ST (SAFETY) ×2 IMPLANT
CONT SPEC 4OZ CLIKSEAL STRL BL (MISCELLANEOUS) IMPLANT
COVER SURGICAL LIGHT HANDLE (MISCELLANEOUS) ×4 IMPLANT
COVER TABLE BACK 60X90 (DRAPES) ×2 IMPLANT
CRADLE DONUT ADULT HEAD (MISCELLANEOUS) ×2 IMPLANT
DECANTER SPIKE VIAL GLASS SM (MISCELLANEOUS) IMPLANT
DRAPE ORTHO SPLIT 77X108 STRL (DRAPES) ×1
DRAPE PROXIMA HALF (DRAPES) ×4 IMPLANT
DRAPE SURG ORHT 6 SPLT 77X108 (DRAPES) ×1 IMPLANT
ELECT COATED BLADE 2.86 ST (ELECTRODE) ×4 IMPLANT
ELECT REM PT RETURN 9FT ADLT (ELECTROSURGICAL) ×4
ELECTRODE REM PT RTRN 9FT ADLT (ELECTROSURGICAL) ×2 IMPLANT
GAUZE SPONGE 4X4 16PLY XRAY LF (GAUZE/BANDAGES/DRESSINGS) ×2 IMPLANT
GLOVE BIO SURGEON STRL SZ7.5 (GLOVE) ×4 IMPLANT
GLOVE BIOGEL PI IND STRL 7.0 (GLOVE) ×2 IMPLANT
GLOVE BIOGEL PI INDICATOR 7.0 (GLOVE) ×2
GLOVE ECLIPSE 7.5 STRL STRAW (GLOVE) ×4 IMPLANT
GLOVE SURG SS PI 6.5 STRL IVOR (GLOVE) ×2 IMPLANT
GOWN STRL NON-REIN LRG LVL3 (GOWN DISPOSABLE) ×10 IMPLANT
GUARD TEETH (MISCELLANEOUS) ×2 IMPLANT
KIT BASIN OR (CUSTOM PROCEDURE TRAY) ×6 IMPLANT
KIT ROOM TURNOVER OR (KITS) ×4 IMPLANT
MARKER SKIN DUAL TIP RULER LAB (MISCELLANEOUS) ×2 IMPLANT
NEEDLE HYPO 25GX1X1/2 BEV (NEEDLE) IMPLANT
NS IRRIG 1000ML POUR BTL (IV SOLUTION) ×4 IMPLANT
PACK EENT II TURBAN DRAPE (CUSTOM PROCEDURE TRAY) ×4 IMPLANT
PAD ARMBOARD 7.5X6 YLW CONV (MISCELLANEOUS) ×6 IMPLANT
PATTIES SURGICAL .5 X3 (DISPOSABLE) IMPLANT
PENCIL FOOT CONTROL (ELECTRODE) ×4 IMPLANT
SOLUTION ANTI FOG 6CC (MISCELLANEOUS) IMPLANT
SPECIMEN JAR SMALL (MISCELLANEOUS) IMPLANT
SPONGE GAUZE 4X4 12PLY (GAUZE/BANDAGES/DRESSINGS) IMPLANT
SURGILUBE 2OZ TUBE FLIPTOP (MISCELLANEOUS) ×2 IMPLANT
SUT CHROMIC 2 0 SH (SUTURE) ×6 IMPLANT
SUT CHROMIC 3 0 SH 27 (SUTURE) ×2 IMPLANT
SUT ETHILON 3 0 FSL (SUTURE) ×2 IMPLANT
SUT ETHILON 3 0 PS 1 (SUTURE) ×2 IMPLANT
SUT SILK 4 0 TIE 10X30 (SUTURE) ×2 IMPLANT
SUT SILK 4 0 TIES 17X18 (SUTURE) IMPLANT
SYR 20CC LL (SYRINGE) ×2 IMPLANT
SYR CONTROL 10ML LL (SYRINGE) IMPLANT
SYR INFLATE BILIARY GAUGE (MISCELLANEOUS) IMPLANT
SYR TB 1ML LUER SLIP (SYRINGE) IMPLANT
SYRINGE 10CC LL (SYRINGE) ×2 IMPLANT
TOWEL OR 17X24 6PK STRL BLUE (TOWEL DISPOSABLE) ×6 IMPLANT
TOWEL OR 17X26 10 PK STRL BLUE (TOWEL DISPOSABLE) ×6 IMPLANT
TUBE CONNECTING 12X1/4 (SUCTIONS) ×6 IMPLANT
TUBE TRACH SHILEY  6 DIST  CUF (TUBING) ×2 IMPLANT
WATER STERILE IRR 1000ML POUR (IV SOLUTION) ×4 IMPLANT

## 2012-05-21 NOTE — Anesthesia Preprocedure Evaluation (Addendum)
Anesthesia Evaluation  Patient identified by MRN, date of birth, ID band Patient awake    Reviewed: Allergy & Precautions, H&P , NPO status , Patient's Chart, lab work & pertinent test results, reviewed documented beta blocker date and time   Airway Mallampati: II TM Distance: >3 FB Neck ROM: Full    Dental  (+) Poor Dentition   Pulmonary Current Smoker,  breath sounds clear to auscultation  + decreased breath sounds      Cardiovascular hypertension, + CAD Rhythm:regular     Neuro/Psych PSYCHIATRIC DISORDERS    GI/Hepatic negative GI ROS, (+)     substance abuse  alcohol use and cocaine use,   Endo/Other  diabetes, Type 1, Insulin Dependent  Renal/GU negative Renal ROS  negative genitourinary   Musculoskeletal   Abdominal Normal abdominal exam  (+)   Peds  Hematology negative hematology ROS (+)   Anesthesia Other Findings See surgeon's H&P   Reproductive/Obstetrics negative OB ROS                          Anesthesia Physical Anesthesia Plan  ASA: IV  Anesthesia Plan: General   Post-op Pain Management:    Induction: Intravenous  Airway Management Planned: Oral ETT and Video Laryngoscope Planned  Additional Equipment:   Intra-op Plan:   Post-operative Plan: Extubation in OR and Possible Post-op intubation/ventilation  Informed Consent: I have reviewed the patients History and Physical, chart, labs and discussed the procedure including the risks, benefits and alternatives for the proposed anesthesia with the patient or authorized representative who has indicated his/her understanding and acceptance.   Dental advisory given  Plan Discussed with: CRNA, Anesthesiologist and Surgeon  Anesthesia Plan Comments:        Anesthesia Quick Evaluation

## 2012-05-21 NOTE — H&P (Signed)
Internal Medicine Attending Admission Note Date: 05/21/2012  Patient name: [name] Medical record number: [id number] Date of birth: 08/14/1965 Age: 46 y.o. Gender: female  I saw and evaluated the patient. I reviewed the resident's note and I agree with the resident's findings and plan as documented in the resident's note, with the following additional comments.  Chief Complaint(s): Cough and sore throat.  History - key components related to admission: Patient is a 46 year old woman with history of type 1 diabetes mellitus, polysubstance abuse, hypertension, and other problems as outlined in medical history, admitted with complaint of nonproductive cough, sore throat, and difficulty breathing.   Physical Exam - key components related to admission:  Filed Vitals:   05/21/12 1100 05/21/12 1115 05/21/12 1123 05/21/12 1126  BP: 151/85 159/95    Pulse: 69 101  80  Temp:   97.5 F (36.4 C)   TempSrc:      Resp: 15 24  25   Height:      Weight:      SpO2: 100% 100%  100%   General: Awake, appears comfortable status post tracheostomy Lungs: Clear Heart: Regular; no extra sounds or murmurs Abdomen: Bowel sounds present, soft , nontender Extremities: No edema   Lab results:   Basic Metabolic Panel:  Basename 05/21/12 0505 05/20/12 2143  NA 136 134*  K 3.5 4.0  CL 107 104  CO2 22 19  GLUCOSE 111* 234*  BUN 8 10  CREATININE 0.62 0.57  CALCIUM 8.3* 8.1*  MG -- --  PHOS -- --   Liver Function Tests:  Basename 05/21/12 0505 05/20/12 0451  AST 9 14  ALT <5 5  ALKPHOS 67 86  BILITOT 0.2* 0.2*  PROT 6.9 8.5*  ALBUMIN 2.6* 3.6    CBC:  Basename 05/21/12 0505 05/20/12 1505 05/20/12 0451  WBC 8.0 6.5 --  NEUTROABS -- -- 5.0  HGB 9.9* 10.4* --  HCT 31.8* 32.6* --  MCV 61.7* 62.3* --  PLT 279 PLATELET CLUMPS NOTED ON SMEAR, COUNT APPEARS ADEQUATE --    CBG:  Basename 05/21/12 1013 05/21/12 0340 05/20/12 2323 05/20/12 1937 05/20/12 1632 05/20/12 1330  GLUCAP  169* 126* 208* 144* 242* 402*    Thyroid Function Tests:  Basename 05/20/12 1127  TSH 2.106  T4TOTAL --  FREET4 --  T3FREE --  THYROIDAB --    Anemia Panel:  Basename 05/20/12 1127  VITAMINB12 837  FOLATE --  FERRITIN 18  TIBC 291  IRON 37*  RETICCTPCT 0.8    Coagulation:  Basename 05/20/12 1127  INR 1.09   Urinalysis    Component Value Date/Time   COLORURINE YELLOW 05/20/2012 1326   APPEARANCEUR HAZY* 05/20/2012 1326   LABSPEC 1.038* 05/20/2012 1326   PHURINE 5.0 05/20/2012 1326   GLUCOSEU >1000* 05/20/2012 1326   HGBUR NEGATIVE 05/20/2012 1326   BILIRUBINUR NEGATIVE 05/20/2012 1326   KETONESUR >80* 05/20/2012 1326   PROTEINUR NEGATIVE 05/20/2012 1326   UROBILINOGEN 0.2 05/20/2012 1326   NITRITE NEGATIVE 05/20/2012 1326   LEUKOCYTESUR NEGATIVE 05/20/2012 1326    Drugs of Abuse     Component Value Date/Time   LABOPIA NONE DETECTED 05/20/2012 1326   COCAINSCRNUR POSITIVE* 05/20/2012 1326   LABBENZ NONE DETECTED 05/20/2012 1326   AMPHETMU NONE DETECTED 05/20/2012 1326   THCU NONE DETECTED 05/20/2012 1326   LABBARB NONE DETECTED 05/20/2012 1326     Imaging results:  Dg Chest 2 View  05/20/2012  *RADIOLOGY REPORT*  Clinical Data: Shortness of breath, productive cough, stridor, history asthma, hypertension,  diabetes, coronary artery disease, latent TB  CHEST - 2 VIEW  Comparison: 11/22/2011  Findings: Normal heart size, mediastinal contours, and pulmonary vascularity. Lungs clear. No pleural effusion or pneumothorax. Probable bilateral nipple shadows, unchanged since 06/07/2008. No acute osseous findings.  IMPRESSION: No acute abnormalities.   Original Report Authenticated By: Ulyses Southward, M.D.    Ct Soft Tissue Neck W Contrast  05/20/2012  *RADIOLOGY REPORT*  Clinical Data: Sore throat.  Cough.  Pharyngitis.  CT NECK WITH CONTRAST  Technique:  Multidetector CT imaging of the neck was performed with intravenous contrast.  Contrast: 75mL OMNIPAQUE IOHEXOL 300 MG/ML  SOLN   Comparison: None.  Findings: A 1.8 x 2.5 x 1.9 cm mass lesion is centered just posterior to the larynx.  There is potential erosion of the left arytenoid cartilage.  The vocal cords are thickened and indistinct. The thyroid cartilage appears to be intact.  This mass displaces the larynx anteriorly.  No significant adenopathy is present.  Limited imaging of the brain is unremarkable.  The lung apices are clear.  Bone windows demonstrate mild degenerative disc disease at C5-6. No focal lytic or blastic lesions are present.  The study is somewhat degraded by patient motion at the C3-4 level.  IMPRESSION:  1.  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 2.  Question erosion of the left arytenoid cartilage and extension into the vocal cords bilaterally, particularly on the right. 3.  No significant adenopathy. 4.  The oropharynx and hypopharynx are clear.   Original Report Authenticated By: Marin Roberts, M.D.     Assessment & Plan by Problem:  1.  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  Patient was seen in the emergency department and followed by Dr. Pollyann Kennedy of ENT, who took her to the OR this morning; esophagoscopy showed esophagitis of the cervical esophagus, and direct laryngoscopy showed diffuse laryngitis with swelling including the endolarynx and subglottic larynx but no esophageal or post cricoid masses were identified.  Patient then underwent a tracheostomy and was transferred to the intensive care unit for further management  2. Type 1 diabetes mellitus.  Patient had an initial anion gap of 16, which is now corrected.  Blood sugar is currently well controlled.     3.  Other problems as per resident physician.

## 2012-05-21 NOTE — Progress Notes (Signed)
eLink Physician-Brief Progress Note Patient Name: Narda Fundora DOB: 1965/08/21 MRN: [id number]  Date of Service  05/21/2012   HPI/Events of Note   Anxiety   eICU Interventions  Ativan prn; with signs of withdrawal we will initiate CIWA protocol       Twilia Yaklin 05/21/2012, 3:27 PM

## 2012-05-21 NOTE — Op Note (Addendum)
OPERATIVE REPORT  DATE OF SURGERY: 05/21/2012  PATIENT:  [name],  46 y.o. female  PRE-OPERATIVE DIAGNOSIS:  Airway obstruction [519.8]  POST-OPERATIVE DIAGNOSIS:  Airway obstruction [519.8]  PROCEDURE:  Procedure(s): DIRECT LARYNGOSCOPY ESOPHAGOSCOPY  SURGEON:  Susy Frizzle, MD  ASSISTANTS: none   ANESTHESIA:   general  EBL:  0 ml  DRAINS: none   LOCAL MEDICATIONS USED:  NONE  SPECIMEN:  No Specimen  COUNTS:  YES  PROCEDURE DETAILS: The patient was taken to the operating room and placed on the operating table in the supine position. Following induction of intravenous anesthesia, the glide laryngoscope was used by anesthesia to visualize the laryngeal airway. The larynx was easily visualized. There is significant swelling of the false cords and the true cords. There was difficult to tell if there is any mobility as she had very been given succinylcholine. She was intubated relatively easily using a #6 tube. The table was then turned 90 and she was draped in a standard fashion for upper endoscopy. #1 esophagoscopy. A rigid cervical esophagoscope was used to visualize the cervical esophagus and the post cricoid area. A tumor was not identified. There was significant mucosal esophagitis throughout the cervical esophagus. Biopsies were not taken. The scope was removed. #2 direct laryngoscopy. A Jako laryngoscope was used to visualize the laryngeal structures. There is significant swelling of the false cords and the true cords. The piriform sinuses were clear. The arytenoids were swollen. The vallecula was clear. Oropharynx was clear and healthy. The endotracheal tube was removed to visualize the endolarynx. There is significant swelling including down in the subglottic larynx. Beyond that, there was no further obstruction. There is no masses identified and biopsies were not taken. The endotracheal tube was then reinserted through the laryngoscope. The scope was then carefully  removed while keeping the tube in place. She was allowed to awaken from anesthesia and was extubated. She was observed in the operating room for several minutes. She was able to move reasonably good air. She still had some mild inspiratory stridor and a very weak voice but no different than preop. She was given a dose of Decadron, 8 mg during the procedure.  Summary: No esophageal or post cricoid masses identified. Diffuse esophagitis. Diffuse laryngitis with swelling including the endolarynx and subglottic larynx. Still unable to tell if the vocal cords have mobility. Will recommend medical treatment and if she gets any worse or develops any respiratory distress, then tracheostomy will be the next step.    PATIENT DISPOSITION:  See below addendum.  After observation in the operating room for about 20 minutes, it was clear that her respiratory distress was worsening. A decision was then made to perform tracheostomy.  05/21/2012 9:53 AM   PATIENT:  [name], 46 y.o. female  PRE-OPERATIVE DIAGNOSIS:  Airway obstruction [519.8]  POST-OPERATIVE DIAGNOSIS:  Airway obstruction [519.8]   PROCEDURE:  Procedure(s): TRACHEOSTOMY  SURGEON:  Surgeon(s): Susy Frizzle, MD  ASSISTANTS: none   ANESTHESIA:   general  EBL: Minimal   BLOOD ADMINISTERED: none  DRAINS: none   LOCAL MEDICATIONS USED:  NONE  COUNTS CORRECT:  YES  PROCEDURE DETAILS: Patient was reintubated. The neck was prepped and draped in a standard fashion. A vertical incision was created just above the sternal notch using electrocautery. The midline fascia was divided. The isthmus of the thyroid was not visualize and the upper trachea was exposed. A tracheotomy was created between the second and third tracheal rings in a horizontal fashion. A lower  tracheal flap was created with scissors and the flap was sutured to the cervical skin using 2-0 chromic suture. The orotracheal tube was removed. The #6 Shiley tracheostomy tube  was placed without difficulty and the cuff was inflated. The shield was secured to the neck using a Velcro straps and nylon suture. The patient was then transferred back to the intensive care unit in critical condition.  PLAN OF CARE: Transfer to ICU  PATIENT DISPOSITION:  ICU - hemodynamically stable.

## 2012-05-21 NOTE — Transfer of Care (Signed)
Immediate Anesthesia Transfer of Care Note  Patient: [name]  Procedure(s) Performed: Procedure(s) (LRB) with comments: DIRECT LARYNGOSCOPY (N/A) ESOPHAGOSCOPY (N/A) TRACHEOSTOMY (N/A)  Patient Location: PACU  Anesthesia Type:General  Level of Consciousness: sedated  Airway & Oxygen Therapy: Patient connected to tracheostomy mask oxygen and Patient placed on Ventilator (see vital sign flow sheet for setting)  Post-op Assessment: Report given to PACU RN  Post vital signs: Reviewed and stable  Complications: No apparent anesthesia complications

## 2012-05-21 NOTE — Progress Notes (Signed)
eLink Physician-Brief Progress Note Patient Name: [name] DOB: 1966/01/07 MRN: [id number]  Date of Service  05/21/2012   HPI/Events of Note  The patient unable to urinate, bladder scan with 900cc urine   eICU Interventions  We will place Foley catheter      [facility] 05/21/2012, 4:32 PM

## 2012-05-21 NOTE — Progress Notes (Signed)
Internal Medicine Teaching Service Night Float Progress Note:  Called by RN stating [name] woke up from sleep and claiming to have difficulty breathing and choking when having to cough.  Went to see [name] with resident.  On 3L [place] O2, saturating 100%, afebrile, HR 85-90s, BP stable.  Was sleeping when we entered, but woke up.  Very hoarse voice, audible stridor on rest, and coughing spell, choking, coughed up bright red blood with clear sputum.  Secretions suctioned by RN.  [name] claims to be feeling worse than admission, difficulty grasping her breath, especially when coughing or clearing through, and Is asking to keep [place] O2 which she feels is helping.    -called Dr. Pollyann Kennedy (ENT) about worsening condition--will come to evaluate -continue to monitor closely    [name] 05/21/12 12:24am

## 2012-05-21 NOTE — OR Nursing (Signed)
At procedure end we moved patient over to the stretcher to transport to PACU.  Patient continued to have difficulty breathing, the anesthesiologist and surgeon called back to the OR suite.  Patient moved back to the OR table, positioned and prepped for Tracheostomy.
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
## 2012-05-21 NOTE — Progress Notes (Signed)
Called in because she tried to cough up some secretions and felt as though she was choking, and in more respiratory distress.  On exam, she is resting quietly, She sounds the same as this afternoon. Sats at 100% on 1 liter O2. We will plan, DL, Esophagoscopy and possible trach later in the morning.

## 2012-05-21 NOTE — H&P (View-Only) (Signed)
Called in because she tried to cough up some secretions and felt as though she was choking, and in more respiratory distress.  On exam, she is resting quietly, She sounds the same as this afternoon. Sats at 100% on 1 liter O2. We will plan, DL, Esophagoscopy and possible trach later in the morning.  

## 2012-05-21 NOTE — Interval H&P Note (Signed)
History and Physical Interval Note:  05/21/2012 7:31 AM  [name]  has presented today for surgery, with the diagnosis of Airway obstruction [519.8]  The various methods of treatment have been discussed with the patient and family. After consideration of risks, benefits and other options for treatment, the patient has consented to  Procedure(s) (LRB) with comments: TRACHEOSTOMY (N/A) DIRECT LARYNGOSCOPY (N/A) ESOPHAGOSCOPY WITH DILITATION (N/A) as a surgical intervention .  The patient's history has been reviewed, patient examined, no change in status, stable for surgery.  I have reviewed the patient's chart and labs.  Questions were answered to the patient's satisfaction.     Darris Carachure

## 2012-05-21 NOTE — Progress Notes (Signed)
Pt refuses to wear BP cuff  

## 2012-05-21 NOTE — Anesthesia Postprocedure Evaluation (Signed)
  Anesthesia Post-op Note  Patient: [name]  Procedure(s) Performed: Procedure(s) (LRB) with comments: DIRECT LARYNGOSCOPY (N/A) ESOPHAGOSCOPY (N/A) TRACHEOSTOMY (N/A)  Patient Location: PACU and SICU  Anesthesia Type:General  Level of Consciousness: sedated  Airway and Oxygen Therapy: Patient remains intubated per anesthesia plan and Patient placed on Ventilator (see vital sign flow sheet for setting)  Post-op Pain: none  Post-op Assessment: Post-op Vital signs reviewed, Patient's Cardiovascular Status Stable, Respiratory Function Stable, Patent Airway, No signs of Nausea or vomiting and Pain level controlled  Post-op Vital Signs: Reviewed and stable  Complications: No apparent anesthesia complications

## 2012-05-21 NOTE — Progress Notes (Signed)
Pt taken to the OR by ENT this morning for scope due to airway obstruction. No mass was identified, but diffuse esophagitis and laryngitis was noted. Prior to leaving the OR, the pt's respiratory distress intensified, and a tracheostomy was performed. The pt was taken to the ICU post-op. Dr. Sung Amabile was contacted, and the pt is currently under care of the PCCM team.

## 2012-05-21 NOTE — Progress Notes (Signed)
Pt complains of increased shortness of breath at rest and her breathing is more labored. Pt afebrile BP 141/77 HR 74  SpO2 100% on 3L O2 RR 12. Dr. Loistine Chance notified. She came to bedside assess pt. Pt choked and coughed up some blood tinged, thin sputum. New orders received to make pt NPO. Dr. Pollyann Kennedy was contacted by Dr. Loistine Chance. He also came to bedside to assess pt. New orders received to prepare for possible procedure in the morning. Will continue to monitor closely.  [place] Sophie Tamez, RN

## 2012-05-22 ENCOUNTER — Encounter (HOSPITAL_COMMUNITY): Payer: Self-pay | Admitting: Otolaryngology

## 2012-05-22 LAB — GLUCOSE, CAPILLARY: Glucose-Capillary: 173 mg/dL — ABNORMAL HIGH (ref 70–99)

## 2012-05-22 NOTE — Progress Notes (Signed)
Pt refused CBG check  

## 2012-05-22 NOTE — Progress Notes (Signed)
Trach Team  SLP reviewed chart for possible readiness for PMSV. According to chart pt is frustrated at inability to communicate. Is pt ready for PMSV? Please order SLP  Passy Muir Valve for evaluation if so. Thanks, Harlon Ditty, MA [name] [phone]
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
## 2012-05-22 NOTE — Progress Notes (Signed)
Rn has attempted to do trach care 3 times this afternoon and patient is not willing to let me do it at this time. Rn will continue to try to explain to the patient the importance.
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
## 2012-05-22 NOTE — Progress Notes (Signed)
Post op trach, doing well, breathing easier. Trach in place and secure. No bleeding. Cuff deflated. Will change to cuffless trach in a couple more days. May leave ICU setting if OK by Med service. May start diet if she is able to swallow.

## 2012-05-22 NOTE — Progress Notes (Signed)
Patient with significant history of diabetes Type 1.  Admitted 05/20/12 with SOB and cough.  Underwent trach on 05/21/12.  + drug screen for cocaine this admission.  Results for TEMPEST, FRANKLAND (MRN [id number]) as of 05/22/2012 09:41  Ref. Range 05/22/2012 07:38  Glucose-Capillary Latest Range: 70-99 mg/dL 621 (H)   Patient refused Lantus 5 units last night.  She also refused CBG checks at midnight and 4am and therefore did not get any Novolog coverage at those times either.  Has seen Norm Parcel, CDE many times at the Internal Medicine clinic.  Patient well known to the Inpatient Diabetes Program (multiple admissions).  Will follow as inpatient. Ambrose Finland RN, MSN, CDE Diabetes Coordinator Inpatient Diabetes Program [phone]

## 2012-05-22 NOTE — Progress Notes (Signed)
PULMONARY  / CRITICAL CARE MEDICINE  Name: [name] MRN: [id number] DOB: 11/23/1965    LOS: 2  REFERRING PRIVIDER:  Margarito Liner  CHIEF COMPLAINT:  Sore throat  BRIEF PATIENT DESCRIPTION:  46 yo female former smoker admitted on 05/20/2012 with progressive cough, sore throat, odynophagia and dyspnea for 5 days.  Concern for cricoid mass on CT neck.  Had Laryngoscopy/esophagoscopy 11/05, but developed post-procedure respiratory distress requiring emergent trach.  Pt transferred to ICU 11/05 and PCCM consulted.  UDS positive for cocaine on admission. Significant PMHx DM type 1, Mental retardation, Polysubstance abuse, HTN, CAD   LINES / TUBES: Janina Mayo Pollyann Kennedy) 11/05>>  CULTURES: Rapid strep 11/04>>negative MRSA screen 11/04>>negative Blood 11/04>> Urine 11/04>>negative  ANTIBIOTICS: None  SIGNIFICANT EVENTS:  11/04 CT neck>>2.5 cm cricoid mass 11/05 Laryngoscopy/esophagoscopy>>diffuse esophagitis/laryngitis, no mass 11/05 Emergent trach for respiratory distress  LEVEL OF CARE:   PRIMARY SERVICE:  Pollyann Kennedy (ENT) CONSULTANTS:  IMTS CODE STATUS: Full DIET:  NPO DVT Px:  Lovenox GI Px:  Protonix   INTERVAL HISTORY:  Sore around trach site.  Denies chest/abd pain.  Breathing okay.  VITAL SIGNS: Temp:  [96.6 F (35.9 C)-99.1 F (37.3 C)] 98.9 F (37.2 C) (11/06 0736) Pulse Rate:  [65-105] 72  (11/06 0900) Resp:  [6-32] 14  (11/06 0900) BP: (117-159)/(67-97) 156/79 mmHg (11/06 0800) SpO2:  [100 %] 100 % (11/06 0900) FiO2 (%):  [28 %-50 %] 28 % (11/06 0900)  PHYSICAL EXAMINATION: General: No distress Neuro:  Alert, normal strength, follows commands HEENT:  No oral exudate, trach site clean Cardiovascular:  s1s2 regular, no murmur Lungs:  No wheeze/rales Abdomen:  Soft, non-tender Musculoskeletal:  No edema Skin:  No rashes  DIAGNOSES: Principal Problem:  *Respiratory distress Active Problems:  Diabetes mellitus type 1, uncontrolled, insulin dependent  SUBSTANCE ABUSE, MULTIPLE  Esophagitis, acute  Edematous laryngitis  Airway obstruction   ASSESSMENT / PLAN: PULMONARY  CXR:  11/04 >> no acute abnormalities   A:  Acute respiratory distress 2nd to upper airway obstruction s/p trach 11/05. P:   Continue trach collar with cuff deflated Will ask speech to assess for speech valve F/u CXR as needed Post trach care per ENT  CARDIOVASCULAR  Lab 05/20/12 0452  TROPONINI --  LATICACIDVEN 3.8*  O2SATVEN --  PROBNP --    A: Hx of CAD, HTN, hyperlipidemia. Not sure if she has been taking her medications. P:  Resume ASA 81 mg qd, Pravachol 40 mg qhs when able to swalllow  RENAL  Lab 05/21/12 0505 05/20/12 2143 05/20/12 1505 05/20/12 0451  NA 136 134* -- 136  K 3.5 4.0 -- 3.8  CL 107 104 -- 98  CO2 22 19 -- 22  BUN 8 10 -- 9  CREATININE 0.62 0.57 0.61 --  CALCIUM 8.3* 8.1* -- 9.1  MG -- -- -- --  PHOS -- -- -- --   Intake/Output      11/05 0701 - 11/06 0700 11/06 0701 - 11/07 0700   I.V. (mL/kg) 2050 (29.5) 50 (0.7)   IV Piggyback 10    Total Intake(mL/kg) 2060 (29.7) 50 (0.7)   Urine (mL/kg/hr) 820 (0.5)    Blood 1    Total Output 821    Net +1239 +50          A:  Urinary retention. Bladder scan 11/05 showed 900 cc urine. P:   Keep foley in for now Defer further assessment to IMTS  GASTROINTESTINAL  Lab 05/21/12 0505 05/20/12 0451  AST 9 14  ALT <5 5  ALKPHOS 67 86  PROT 6.9 8.5*  ALBUMIN 2.6* 3.6    A:  Dysphagia/odynophagia. Nutrition. P:   Speech therapy to assess swallowing, and then advance diet as tolerated Continue IV fluid until able to swallow  HEMATOLOGIC  Lab 05/21/12 0505 05/20/12 1505 05/20/12 1127 05/20/12 0451  HGB 9.9* 10.4* -- 10.8*  HCT 31.8* 32.6* -- 34.2*  PLT 279 PLATELET CLUMPS NOTED ON SMEAR, COUNT APPEARS ADEQUATE -- 213  INR -- -- 1.09 --  APTT -- -- -- --    A:  Anemia of chronic disease. P:  F/u CBC  INFECTIOUS  Lab 05/21/12 0505 05/20/12 1505 05/20/12 0451   PROCALCITON -- -- --  WBC 8.0 6.5 7.6    A:  No evidence for infection. P:   Monitor clinically  ENDOCRINE CBG  Lab 05/22/12 0738 05/21/12 1922 05/21/12 1706 05/21/12 1242 05/21/12 1013  GLUCAP 323* 140* 134* 276* 169*    A:  DM type I. Reported hx of hypothyroidism. Not on thyroid replacement as outpt.  TSH 2.106 from 11/04. P:   SSI with lantus  NEUROLOGIC  A:  Cocaine abuse. Pt denies recent alcohol use, and on evidence for DT's. Mental retardation. P:   Monitor clinically Listed as being on risperdal 3 mg qhs, depakote ER 750 mg qhs as outpt>>will need to resume these when able to swallow  SUMMARY:  Ready for transfer to medical floor bed.  Will ask IMTS to resume care from 11/07.  PCCM will sign off.  Medical non-compliance will likely continue to be an issue.  Coralyn Helling, MD [facility] Pulmonary/Critical Care 05/22/2012, 9:47 AM Pager:  [phone] After 3pm call: [phone]

## 2012-05-22 NOTE — Progress Notes (Signed)
Report called to RN and met RN at bedside. Patient transferred to [facility] 04 without complications.  All emergency equipment for trach and patient belongings including clothes, a purse and a cell phone with charger travelled with her.

## 2012-05-22 NOTE — Progress Notes (Signed)
Pt refused trach care.  

## 2012-05-23 LAB — BASIC METABOLIC PANEL
BUN: 6 mg/dL (ref 6–23)
Chloride: 101 mEq/L (ref 96–112)
Creatinine, Ser: 0.66 mg/dL (ref 0.50–1.10)
Glucose, Bld: 190 mg/dL — ABNORMAL HIGH (ref 70–99)
Potassium: 3.8 mEq/L (ref 3.5–5.1)

## 2012-05-23 LAB — GLUCOSE, CAPILLARY
Glucose-Capillary: 128 mg/dL — ABNORMAL HIGH (ref 70–99)
Glucose-Capillary: 101 mg/dL — ABNORMAL HIGH (ref 70–99)
Glucose-Capillary: 139 mg/dL — ABNORMAL HIGH (ref 70–99)

## 2012-05-23 MED ORDER — RESOURCE THICKENUP CLEAR PO POWD
ORAL | Status: DC | PRN
  Filled 2012-05-23 (×2): qty 125
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
## 2012-05-23 NOTE — Progress Notes (Signed)
Pt informed Rn that she has bed bugs at home. Pt family member also said that she has had them for 2 months or so. Rn spoke with infection prevention nurse and was told to do a thorough assessment of the patient and to bag any belongings that are from home. Rn went in and check patient. Rn inquired about patient's purse and clothing and she stated that she had just bought both the clothes and the purse. Rn did not visualize any bugs. Will continue to monitor patient.
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
## 2012-05-23 NOTE — Progress Notes (Signed)
Subjective: Transferred from ICU s/p tracheostomy for acute respiratory distress secondary to upper airway obstruction, no acute events overnight, reports breathing well, poor tolerance to PMSV on initial attempt yesterday, refused trial of po yesterday but agreed to attempt today, nods "yes" or "no" to queries  Objective: Vital signs in last 24 hours: Filed Vitals:   05/22/12 1800 05/22/12 2110 05/23/12 0150 05/23/12 0516  BP: 128/63 139/69 126/71 124/65  Pulse: 80 72 84 78  Temp: 98.4 F (36.9 C) 97.9 F (36.6 C) 98.7 F (37.1 C) 97.9 F (36.6 C)  TempSrc: Oral     Resp: 18 18 18 16   Height:      Weight:      SpO2: 100% 100% 100% 100%   Weight change:   Intake/Output Summary (Last 24 hours) at 05/23/12 1220 Last data filed at 05/23/12 0500  Gross per 24 hour  Intake    845 ml  Output   1350 ml  Net   -505 ml   Physical Exam: General: Well-developed, well-nourished, in no acute distress; awake, resting in bed Head: Normocephalic, atraumatic. Neck: trach in place connected to vent Lungs: shallow inspirations, upper airway noises transmitted throughout, no wheezes or rhonchi appreciated. Heart: normal rate, regular rhythm, normal S1 and S2, no gallop, murmur, or rubs appreciated. Abdomen: BS normoactive. Soft, Nondistended, non-tender Neurologic: grossly non-focal, alert and oriented x3, appropriate and cooperative throughout examination.   Lab Results: Basic Metabolic Panel:  Lab 05/23/12 6962 05/21/12 0505  NA 135 136  K 3.8 3.5  CL 101 107  CO2 21 22  GLUCOSE 190* 111*  BUN 6 8  CREATININE 0.66 0.62  CALCIUM 8.1* 8.3*  MG -- --  PHOS -- --   Liver Function Tests:  Lab 05/21/12 0505 05/20/12 0451  AST 9 14  ALT <5 5  ALKPHOS 67 86  BILITOT 0.2* 0.2*  PROT 6.9 8.5*  ALBUMIN 2.6* 3.6   CBC:  Lab 05/21/12 0505 05/20/12 1505 05/20/12 0451  WBC 8.0 6.5 --  NEUTROABS -- -- 5.0  HGB 9.9* 10.4* --  HCT 31.8* 32.6* --  MCV 61.7* 62.3* --  PLT 279  PLATELET CLUMPS NOTED ON SMEAR, COUNT APPEARS ADEQUATE --   CBG:  Lab 05/23/12 0823 05/23/12 0340 05/22/12 2345 05/22/12 2040 05/22/12 1615 05/22/12 1123  GLUCAP 209* 128* 173* 195* 96 183*   Thyroid Function Tests:  Lab 05/20/12 1127  TSH 2.106  T4TOTAL --  FREET4 --  T3FREE --  THYROIDAB --   Coagulation:  Lab 05/20/12 1127  LABPROT 14.0  INR 1.09   Anemia Panel:  Lab 05/20/12 1127  VITAMINB12 837  FOLATE >24.0  FERRITIN 18  TIBC 291  IRON 37*  RETICCTPCT 0.8   Urine Drug Screen: Drugs of Abuse     Component Value Date/Time   LABOPIA NONE DETECTED 05/20/2012 1326   COCAINSCRNUR POSITIVE* 05/20/2012 1326   LABBENZ NONE DETECTED 05/20/2012 1326   AMPHETMU NONE DETECTED 05/20/2012 1326   THCU NONE DETECTED 05/20/2012 1326   LABBARB NONE DETECTED 05/20/2012 1326    Urinalysis:  Lab 05/20/12 1326  COLORURINE YELLOW  LABSPEC 1.038*  PHURINE 5.0  GLUCOSEU >1000*  HGBUR NEGATIVE  BILIRUBINUR NEGATIVE  KETONESUR >80*  PROTEINUR NEGATIVE  UROBILINOGEN 0.2  NITRITE NEGATIVE  LEUKOCYTESUR NEGATIVE    Micro Results: Recent Results (from the past 240 hour(s))  RAPID STREP SCREEN     Status: Normal   Collection Time   05/20/12  7:28 AM  Component Value Range Status Comment   Streptococcus, Group A Screen (Direct) NEGATIVE  NEGATIVE Final   CULTURE, BLOOD (ROUTINE X 2)     Status: Normal (Preliminary result)   Collection Time   05/20/12 11:07 AM      Component Value Range Status Comment   Specimen Description BLOOD HAND LEFT   Final    Special Requests BOTTLES DRAWN AEROBIC ONLY 2CC   Final    Culture  Setup Time 05/20/2012 19:31   Final    Culture     Final    Value:        BLOOD CULTURE RECEIVED NO GROWTH TO DATE CULTURE WILL BE HELD FOR 5 DAYS BEFORE ISSUING A FINAL NEGATIVE REPORT   Report Status PENDING   Incomplete   CULTURE, BLOOD (ROUTINE X 2)     Status: Normal (Preliminary result)   Collection Time   05/20/12 11:22 AM      Component Value Range  Status Comment   Specimen Description BLOOD WRIST LEFT   Final    Special Requests BOTTLES DRAWN AEROBIC ONLY 5CC   Final    Culture  Setup Time 05/20/2012 19:31   Final    Culture     Final    Value:        BLOOD CULTURE RECEIVED NO GROWTH TO DATE CULTURE WILL BE HELD FOR 5 DAYS BEFORE ISSUING A FINAL NEGATIVE REPORT   Report Status PENDING   Incomplete   URINE CULTURE     Status: Normal   Collection Time   05/20/12  1:26 PM      Component Value Range Status Comment   Specimen Description URINE, CLEAN CATCH   Final    Special Requests NONE   Final    Culture  Setup Time 05/20/2012 14:25   Final    Colony Count NO GROWTH   Final    Culture NO GROWTH   Final    Report Status 05/21/2012 FINAL   Final   MRSA PCR SCREENING     Status: Normal   Collection Time   05/20/12  2:33 PM      Component Value Range Status Comment   MRSA by PCR NEGATIVE  NEGATIVE Final    Studies/Results: No results found. Medications: I have reviewed the patient's current medications. Scheduled Meds:   . enoxaparin (LOVENOX) injection  40 mg Subcutaneous Q24H  . insulin aspart  0-9 Units Subcutaneous Q4H  . insulin glargine  5 Units Subcutaneous QHS  . pantoprazole (PROTONIX) IV  40 mg Intravenous Q12H  . sodium chloride  3 mL Intravenous Q12H   Continuous Infusions:   . 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  1. Acute respiratory distress with esophagitis, laryngitis and larygyngeal edema: crack cocaine use likely contributor, s/p trach 05/21/12, Speech following and working with patient to  tolerate speech valve but patient with poor tolerance to PMSV -Post trach care per ENT, DR. Rosen to change to smaller cuffless trach 05/24/2012.    2. Dysphagia/odynophagia: swallowing assessed per Speech therapy, at risk for silent aspiration  -per ENT will defer MBS until after planned trach change  tomorrow -will begin full, nectar thick liquid diet tomorrow after trach change and reassess by Speech therapy   3. Diabetes Mellitus, Type I: good control on Lantus 5 units and meal coverage -will increase Lantus once eating   CBG (last 3)   Basename 05/24/12 0421 05/24/12 0006 05/23/12 1936  GLUCAP 124* 108* 159*    4. Anemia of chronic disease: stable  Lab 05/21/12 0505 05/20/12 1505 05/20/12 0451  HGB 9.9* 10.4* 10.8*    5. Cocaine abuse: chronic -positive cocaine on admission UDS and each admission for past several years -Social Work consult for cessation, [facility] SW Lynnae January would like to make referral to [employer] community support  6. Mental retardation in setting of Bipolar: -resume risperdal 3 mg qhs, depakote ER 750 mg qhswhen able to take orals  7. Hyperlipidemia: goal LDL <70 in setting of diabetes -resume Pravastatin when taking orals  Lipid Panel     Component Value Date/Time   CHOL 202* 03/19/2012 1015   TRIG 122 03/19/2012 1015   HDL 71 03/19/2012 1015   CHOLHDL 2.8 03/19/2012 1015   VLDL 24 03/19/2012 1015   LDLCALC 107* 03/19/2012 1015    8. DVT ppx: Lovenox SQ   LOS: 3 days   Hosam Mcfetridge 05/23/2012, 12:20 PM

## 2012-05-24 LAB — GLUCOSE, CAPILLARY
Glucose-Capillary: 124 mg/dL — ABNORMAL HIGH (ref 70–99)
Glucose-Capillary: 241 mg/dL — ABNORMAL HIGH (ref 70–99)
Glucose-Capillary: 294 mg/dL — ABNORMAL HIGH (ref 70–99)
Glucose-Capillary: 253 mg/dL — ABNORMAL HIGH (ref 70–99)

## 2012-05-24 NOTE — Progress Notes (Signed)
[facility]  Patient Status: New  [facility] is providing the following services: RN and MSW  If patient discharges after hours, please call [phone].   [name] 05/24/2012, 4:05 PM

## 2012-05-24 NOTE — Progress Notes (Signed)
Speech Language Pathology Dysphagia and PMSV  Treatment Patient Details Name: [name] MRN: [id number] DOB: 11/07/1965 Today's Date: 05/24/2012 Time: 1240-1300;  [id number] SLP Time Calculation (min): 20 min; 10 min  Assessment / Plan / Recommendation Clinical Impression  F/u for PMSV and swallowing.  Pt's trach changed this am to a cuffless 6.  Coughing thick secretions through trach.  PMSV placed intermittently to determine safety for use -  continues with poor patency of upper airway; achieves low intensity/higher-pitched phonation for brief intervals, followed by removal of valve and expulsion of air from trach.  Edema in larynx still impeding adequate airflow through upper airway.  Full liquid tray sent for lunch - pt  observed consuming thin liquids, followed by immediate coughing and desaturation to 75%.  Provided with nectar-thick liquids - maintained Sp02 at 97% and higher and clinically appeared more comfortable.       Diet Recommendation    Continue nectar-thick liquids ; will need to thicken items that arrive on tray   SLP Plan   continue per plan; pt may benefit from [facility] - spoke with medical student re: today's session.  Pertinent Vitals/Pain No c/o pain   Swallowing/PMSV goals   Slow progress  General Temperature Spikes Noted: No Respiratory Status: Trach Behavior/Cognition: Alert;Other (comment) (anxious) Oral Cavity - Dentition: Adequate natural dentition Patient Positioning: Upright in bed  Oral Cavity - Oral Hygiene   white film mucosa of tongue  Dysphagia Treatment Treatment focused on: Skilled observation of diet tolerance Treatment Methods/Modalities: Skilled observation Patient observed directly with PO's: Yes Type of PO's observed: Thin liquids and nectars Feeding: Able to feed self Liquids provided via: Cup Pharyngeal Phase Signs & Symptoms: Changes in respirations;Other (comment) (desat to 75% with thin liquids) Amount of cueing: Minimal   GO      [name] [name], [place] CCC/SLP Pager [phone]  [name] 05/24/2012, 2:35 PM

## 2012-05-24 NOTE — Progress Notes (Signed)
Subjective: Transferred yesterday from ICU s/p tracheostomy for acute respiratory distress secondary to upper airway obstruction, no acute events overnight, reports breathing well. Nods "yes" or "no" and writes down any questions.  Objective: Vital signs in last 24 hours: Filed Vitals:   05/24/12 0003 05/24/12 0143 05/24/12 0408 05/24/12 0644  BP:  91/55  113/64  Pulse: 111 89 77 82  Temp:  98.3 F (36.8 C)  98.3 F (36.8 C)  TempSrc:    Oral  Resp: 22 16 18 18   Height:      Weight:      SpO2: 100% 99% 100% 100%   Weight change:   Intake/Output Summary (Last 24 hours) at 05/24/12 1610 Last data filed at 05/24/12 0600  Gross per 24 hour  Intake   1410 ml  Output   2801 ml  Net  -1391 ml   Physical Exam: General: Well-developed, well-nourished, NAD; awake, lying in bed Head: Normocephalic, atraumatic. Neck: Trach in place connected to vent Lungs: Upper airway noises transmitted throughout, no wheezes or rhonchi appreciated, productive cough at times. Heart: RRR, normal S1 and S2, no gallop, murmur, or rubs appreciated. Abdomen: S, NT, ND, +BS Neurologic: Grossly non-focal, alert and oriented x3, appropriate and cooperative throughout examination.   Lab Results: Basic Metabolic Panel:  Lab 05/23/12 9604 05/21/12 0505  NA 135 136  K 3.8 3.5  CL 101 107  CO2 21 22  GLUCOSE 190* 111*  BUN 6 8  CREATININE 0.66 0.62  CALCIUM 8.1* 8.3*  MG -- --  PHOS -- --   Liver Function Tests:  Lab 05/21/12 0505 05/20/12 0451  AST 9 14  ALT <5 5  ALKPHOS 67 86  BILITOT 0.2* 0.2*  PROT 6.9 8.5*  ALBUMIN 2.6* 3.6   CBC:  Lab 05/21/12 0505 05/20/12 1505 05/20/12 0451  WBC 8.0 6.5 --  NEUTROABS -- -- 5.0  HGB 9.9* 10.4* --  HCT 31.8* 32.6* --  MCV 61.7* 62.3* --  PLT 279 PLATELET CLUMPS NOTED ON SMEAR, COUNT APPEARS ADEQUATE --   CBG:  Lab 05/24/12 0755 05/24/12 0421 05/24/12 0006 05/23/12 1936 05/23/12 1616 05/23/12 1250  GLUCAP 241* 124* 108* 159* 139* 101*    Thyroid Function Tests:  Lab 05/20/12 1127  TSH 2.106  T4TOTAL --  FREET4 --  T3FREE --  THYROIDAB --   Coagulation:  Lab 05/20/12 1127  LABPROT 14.0  INR 1.09   Anemia Panel:  Lab 05/20/12 1127  VITAMINB12 837  FOLATE >24.0  FERRITIN 18  TIBC 291  IRON 37*  RETICCTPCT 0.8   Urine Drug Screen: Drugs of Abuse     Component Value Date/Time   LABOPIA NONE DETECTED 05/20/2012 1326   COCAINSCRNUR POSITIVE* 05/20/2012 1326   LABBENZ NONE DETECTED 05/20/2012 1326   AMPHETMU NONE DETECTED 05/20/2012 1326   THCU NONE DETECTED 05/20/2012 1326   LABBARB NONE DETECTED 05/20/2012 1326    Urinalysis:  Lab 05/20/12 1326  COLORURINE YELLOW  LABSPEC 1.038*  PHURINE 5.0  GLUCOSEU >1000*  HGBUR NEGATIVE  BILIRUBINUR NEGATIVE  KETONESUR >80*  PROTEINUR NEGATIVE  UROBILINOGEN 0.2  NITRITE NEGATIVE  LEUKOCYTESUR NEGATIVE    Micro Results: Recent Results (from the past 240 hour(s))  RAPID STREP SCREEN     Status: Normal   Collection Time   05/20/12  7:28 AM      Component Value Range Status Comment   Streptococcus, Group A Screen (Direct) NEGATIVE  NEGATIVE Final   CULTURE, BLOOD (ROUTINE X 2)  Status: Normal (Preliminary result)   Collection Time   05/20/12 11:07 AM      Component Value Range Status Comment   Specimen Description BLOOD HAND LEFT   Final    Special Requests BOTTLES DRAWN AEROBIC ONLY 2CC   Final    Culture  Setup Time 05/20/2012 19:31   Final    Culture     Final    Value:        BLOOD CULTURE RECEIVED NO GROWTH TO DATE CULTURE WILL BE HELD FOR 5 DAYS BEFORE ISSUING A FINAL NEGATIVE REPORT   Report Status PENDING   Incomplete   CULTURE, BLOOD (ROUTINE X 2)     Status: Normal (Preliminary result)   Collection Time   05/20/12 11:22 AM      Component Value Range Status Comment   Specimen Description BLOOD WRIST LEFT   Final    Special Requests BOTTLES DRAWN AEROBIC ONLY 5CC   Final    Culture  Setup Time 05/20/2012 19:31   Final    Culture     Final     Value:        BLOOD CULTURE RECEIVED NO GROWTH TO DATE CULTURE WILL BE HELD FOR 5 DAYS BEFORE ISSUING A FINAL NEGATIVE REPORT   Report Status PENDING   Incomplete   URINE CULTURE     Status: Normal   Collection Time   05/20/12  1:26 PM      Component Value Range Status Comment   Specimen Description URINE, CLEAN CATCH   Final    Special Requests NONE   Final    Culture  Setup Time 05/20/2012 14:25   Final    Colony Count NO GROWTH   Final    Culture NO GROWTH   Final    Report Status 05/21/2012 FINAL   Final   MRSA PCR SCREENING     Status: Normal   Collection Time   05/20/12  2:33 PM      Component Value Range Status Comment   MRSA by PCR NEGATIVE  NEGATIVE Final    Studies/Results: No results found. Medications: I have reviewed the patient's current medications. Scheduled Meds:    . enoxaparin (LOVENOX) injection  40 mg Subcutaneous Q24H  . insulin aspart  0-9 Units Subcutaneous Q4H  . insulin glargine  5 Units Subcutaneous QHS  . pantoprazole (PROTONIX) IV  40 mg Intravenous Q12H  . sodium chloride  3 mL Intravenous Q12H   Continuous Infusions:    . 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  1. Acute respiratory distress with esophagitis, laryngitis and larygyngeal edema: Crack cocaine use likely contributor causing significant edema and resulting resp distress resulting in emergent tracheostomy on 05/21/12. She was on a ventilator post op in the ICU. She was transferred to the floor 05/23/12 and has been on trach collar  and has been tolerating it well. Speech has been working with patient to tolerate the Passy-Muir speaking valve and to evaluate her swallowing. Her trach was downsized to day to a #6 cuffless today by Dr. Pollyann Kennedy, and the  pt states that since she has been better able to tolerate the Passy-Muir valve. - Trach care and teaching to the pt and her family - RT trach evaluation - PT for evaluation - Home health in prep for d/c  2. Dysphagia/odynophagia: Swallowing assessed per Speech therapy, at risk for silent aspiration. Per ENT MBS after trach change today. After cleared by Speech, will begin full, nectar thick liquid diet.   3. Diabetes Mellitus, Type I: Good control on Lantus 5 units and meal coverage - Increase Lantus once able to tolerate a diet  CBG (last 3)   Basename 05/24/12 0755 05/24/12 0421 05/24/12 0006  GLUCAP 241* 124* 108*    4. Anemia of chronic disease: Hgb stable  Lab 05/21/12 0505 05/20/12 1505 05/20/12 0451  HGB 9.9* 10.4* 10.8*    5. Cocaine abuse: Chronic issue for the pt. UDS positive for cocaine on this admission previous admissions for the past several years. [facility] SW Lynnae January would like to make referral to [employer] community support. Social Work consulted for cessation.  6. Mental retardation in setting of Bipolar d/o: - Resume Risperdal 3 mg qhs when able to take po - Resume Depakote ER 750 mg qhs when able to take po  7. Hyperlipidemia: Goal LDL <70 in setting of diabetes - Resume Pravastatin when taking po  Lipid Panel     Component Value Date/Time   CHOL 202* 03/19/2012 1015   TRIG 122 03/19/2012 1015   HDL 71 03/19/2012 1015   CHOLHDL 2.8 03/19/2012 1015   VLDL 24 03/19/2012 1015   LDLCALC 107* 03/19/2012 1015    8. DVT ppx: Lovenox SQ  9. Dispo: Pending trach teaching to pt and family. Likely d/c on Monday.    LOS: 4 days   Genelle Gather 05/24/2012, 8:08 AM

## 2012-05-24 NOTE — Care Management Note (Signed)
  Page 2 of 2   05/24/2012     11:45:35 AM   CARE MANAGEMENT NOTE 05/24/2012  Patient:  [name], [name]   Account Number:  [id number]  Date Initiated:  05/22/2012  Documentation initiated by:  Ronny Flurry  Subjective/Objective Assessment:   PROCEDURE:  Procedure(s):  DIRECT LARYNGOSCOPY  ESOPHAGOSCOPY     Action/Plan:   After observation in the operating room for about 20 minutes, it was clear that her respiratory distress was worsening. A decision was then made to perform tracheostomy.   Anticipated DC Date:     Anticipated DC Plan:  HOME W HOME HEALTH SERVICES         Choice offered to / List presented to:  C-1 Patient   DME arranged  TRACH SUPPLIES  SUCTION      DME agency  [facility]     [facility] arranged  HH-1 RN  HH-7 RESPIRATORY THERAPY  HH-6 SOCIAL WORKER      HH agency  [facility]   Status of service:  In process, will continue to follow Medicare Important Message given?   (If response is "NO", the following Medicare IM given date fields will be blank) Date Medicare IM given:   Date Additional Medicare IM given:    Discharge Disposition:  HOME W HOME HEALTH SERVICES  Per UR Regulation:  Reviewed for med. necessity/level of care/duration of stay  If discussed at Long Length of Stay Meetings, dates discussed:    Comments:   05-24-12 Spoke with patient and her daughter .  Daughter states prior to admission [name] lived with her at [address] . This is the location that has bed bugs.  At discharge patient is going to stay with her mother [name] ( [phone]) at [address] , [place] ( face sheet information ) . Patient's cell phone is [phone].  Patient acknowledged she is willing to learn trach care / suctioning . Daughter also willing and stated [name] will be comfortable with learning trach care .  [name] is planning to visit patient this afternoon. I will met with her than to  discuss discharge planning also .  Advanced referral made .   Education on trach care  with patient and family needs to be completed , before discharge to home .  Earilest discharge could be Monday 05-27-12. DR Sherrine Maples in agreement .  Ronny Flurry RN BSN [phone]   46 yo female former smoker admitted on 05/20/2012 with progressive cough, sore throat, odynophagia and dyspnea for 5 days.  Concern for cricoid mass on CT neck.  Had Laryngoscopy/esophagoscopy 11/05, but developed post-procedure respiratory distress requiring emergent trach.    UDS positive for cocaine on admission 05-22-12 Cuff deflated. Will change to cuffless trach in a couple more days

## 2012-05-24 NOTE — Progress Notes (Signed)
Doing well overall. Trach changed to #6 uncuffed. She is able to phonate a little. In discussing with her daughter what may have caused this, it turns out that she had been smoking cocaine, not snorting. This can certainly explain the severe and sudden onset of laryngeal swelling. I explained to the family that she may need the trach for weeks, months, years or possibly permanently. She may start using Passy-Muir valve, eating what she can tolerate. Trach care and post discharge training for her and family should be initiated. I will sign off for now but will re-examen her larynx in a few weeks as an out patient.

## 2012-05-24 NOTE — Progress Notes (Signed)
Internal Medicine Attending  Date: 05/24/2012  Patient name: [name] Medical record number: [id number] Date of birth: 1965/10/01 Age: 46 y.o. Gender: female  I saw and evaluated the patient, and discussed her care on a.m. rounds with house staff . I reviewed the resident's note by Dr. Sherrine Maples and I agree with the resident's findings and plans as documented in her note.

## 2012-05-25 ENCOUNTER — Inpatient Hospital Stay (HOSPITAL_COMMUNITY): Payer: Medicaid Other

## 2012-05-25 DIAGNOSIS — F319 Bipolar disorder, unspecified: Secondary | ICD-10-CM

## 2012-05-25 DIAGNOSIS — F79 Unspecified intellectual disabilities: Secondary | ICD-10-CM

## 2012-05-25 DIAGNOSIS — F1410 Cocaine abuse, uncomplicated: Secondary | ICD-10-CM

## 2012-05-25 DIAGNOSIS — D638 Anemia in other chronic diseases classified elsewhere: Secondary | ICD-10-CM

## 2012-05-25 DIAGNOSIS — B86 Scabies: Secondary | ICD-10-CM

## 2012-05-25 LAB — GLUCOSE, CAPILLARY
Glucose-Capillary: 383 mg/dL — ABNORMAL HIGH (ref 70–99)
Glucose-Capillary: 91 mg/dL (ref 70–99)

## 2012-05-25 MED ORDER — INSULIN ASPART 100 UNIT/ML ~~LOC~~ SOLN
0.0000 [IU] | SUBCUTANEOUS | Status: DC
  Administered 2012-05-25: 15 [IU] via SUBCUTANEOUS
  Administered 2012-05-25: 2 [IU] via SUBCUTANEOUS
  Administered 2012-05-26 (×2): 8 [IU] via SUBCUTANEOUS
  Administered 2012-05-26: 17 [IU] via SUBCUTANEOUS
  Administered 2012-05-27 (×2): 5 [IU] via SUBCUTANEOUS
  Administered 2012-05-27: 2 [IU] via SUBCUTANEOUS
  Administered 2012-05-27 – 2012-05-28 (×2): 3 [IU] via SUBCUTANEOUS
  Administered 2012-05-28: 5 [IU] via SUBCUTANEOUS
  Administered 2012-05-28: 3 [IU] via SUBCUTANEOUS
  Administered 2012-05-29: 2 [IU] via SUBCUTANEOUS
  Administered 2012-05-29: 10 [IU] via SUBCUTANEOUS
  Administered 2012-05-29: 5 [IU] via SUBCUTANEOUS

## 2012-05-25 MED ORDER — CHLORHEXIDINE GLUCONATE 0.12 % MT SOLN
15.0000 mL | Freq: Two times a day (BID) | OROMUCOSAL | Status: DC
  Administered 2012-05-25 – 2012-05-26 (×3): 15 mL via OROMUCOSAL
  Filled 2012-05-25 (×2): qty 15

## 2012-05-25 MED ORDER — PERMETHRIN 5 % EX CREA
TOPICAL_CREAM | Freq: Once | CUTANEOUS | Status: AC
  Administered 2012-05-25: 18:00:00 via TOPICAL
  Filled 2012-05-25: qty 60

## 2012-05-25 MED ORDER — BIOTENE DRY MOUTH MT LIQD
15.0000 mL | Freq: Two times a day (BID) | OROMUCOSAL | Status: DC
  Administered 2012-05-26 (×2): 15 mL via OROMUCOSAL

## 2012-05-25 NOTE — Progress Notes (Signed)
Subjective: Pt signals that she is not in a good mood today. Nods "yes" or "no" and writes down any questions. She writes that she has bed bugs and has not been treated.  Objective: Vital signs in last 24 hours: Filed Vitals:   05/24/12 2347 05/25/12 0410 05/25/12 0500 05/25/12 0755  BP:   117/75   Pulse: 98 97 110 101  Temp:   99 F (37.2 C)   TempSrc:   Oral   Resp: 14 14 14 15   Height:      Weight:      SpO2: 100% 100% 100% 100%   Weight change:   Intake/Output Summary (Last 24 hours) at 05/25/12 1039 Last data filed at 05/25/12 0600  Gross per 24 hour  Intake   1203 ml  Output   3200 ml  Net  -1997 ml   Physical Exam: General: Well-developed, well-nourished, NAD; awake, lying in bed Head: Normocephalic, atraumatic. Neck: Trach in place connected to vent, secretions present Lungs: CTAB, no wheezes or rhonchi appreciated, productive cough at times. Heart: RRR, normal S1 and S2, no gallop, murmur, or rubs appreciated. Abdomen: S, NT, ND, +BS Ext: 2+ pulses, no edema noted Skin: Possible scabies burrows on R arm, leg and left flank. Neurologic: Grossly non-focal, alert and oriented x3  Lab Results: Basic Metabolic Panel:  Lab 05/23/12 1610 05/21/12 0505  NA 135 136  K 3.8 3.5  CL 101 107  CO2 21 22  GLUCOSE 190* 111*  BUN 6 8  CREATININE 0.66 0.62  CALCIUM 8.1* 8.3*  MG -- --  PHOS -- --   Liver Function Tests:  Lab 05/21/12 0505 05/20/12 0451  AST 9 14  ALT <5 5  ALKPHOS 67 86  BILITOT 0.2* 0.2*  PROT 6.9 8.5*  ALBUMIN 2.6* 3.6   CBC:  Lab 05/21/12 0505 05/20/12 1505 05/20/12 0451  WBC 8.0 6.5 --  NEUTROABS -- -- 5.0  HGB 9.9* 10.4* --  HCT 31.8* 32.6* --  MCV 61.7* 62.3* --  PLT 279 PLATELET CLUMPS NOTED ON SMEAR, COUNT APPEARS ADEQUATE --   CBG:  Lab 05/25/12 0744 05/25/12 0446 05/24/12 2237 05/24/12 1602 05/24/12 1221 05/24/12 0755  GLUCAP 172* 316* 129* 253* 294* 241*   Thyroid Function Tests:  Lab 05/20/12 1127  TSH 2.106  T4TOTAL  --  FREET4 --  T3FREE --  THYROIDAB --   Coagulation:  Lab 05/20/12 1127  LABPROT 14.0  INR 1.09   Anemia Panel:  Lab 05/20/12 1127  VITAMINB12 837  FOLATE >24.0  FERRITIN 18  TIBC 291  IRON 37*  RETICCTPCT 0.8   Urine Drug Screen: Drugs of Abuse     Component Value Date/Time   LABOPIA NONE DETECTED 05/20/2012 1326   COCAINSCRNUR POSITIVE* 05/20/2012 1326   LABBENZ NONE DETECTED 05/20/2012 1326   AMPHETMU NONE DETECTED 05/20/2012 1326   THCU NONE DETECTED 05/20/2012 1326   LABBARB NONE DETECTED 05/20/2012 1326    Urinalysis:  Lab 05/20/12 1326  COLORURINE YELLOW  LABSPEC 1.038*  PHURINE 5.0  GLUCOSEU >1000*  HGBUR NEGATIVE  BILIRUBINUR NEGATIVE  KETONESUR >80*  PROTEINUR NEGATIVE  UROBILINOGEN 0.2  NITRITE NEGATIVE  LEUKOCYTESUR NEGATIVE    Micro Results: Recent Results (from the past 240 hour(s))  RAPID STREP SCREEN     Status: Normal   Collection Time   05/20/12  7:28 AM      Component Value Range Status Comment   Streptococcus, Group A Screen (Direct) NEGATIVE  NEGATIVE Final   CULTURE, BLOOD (  ROUTINE X 2)     Status: Normal (Preliminary result)   Collection Time   05/20/12 11:07 AM      Component Value Range Status Comment   Specimen Description BLOOD HAND LEFT   Final    Special Requests BOTTLES DRAWN AEROBIC ONLY 2CC   Final    Culture  Setup Time 05/20/2012 19:31   Final    Culture     Final    Value:        BLOOD CULTURE RECEIVED NO GROWTH TO DATE CULTURE WILL BE HELD FOR 5 DAYS BEFORE ISSUING A FINAL NEGATIVE REPORT   Report Status PENDING   Incomplete   CULTURE, BLOOD (ROUTINE X 2)     Status: Normal (Preliminary result)   Collection Time   05/20/12 11:22 AM      Component Value Range Status Comment   Specimen Description BLOOD WRIST LEFT   Final    Special Requests BOTTLES DRAWN AEROBIC ONLY 5CC   Final    Culture  Setup Time 05/20/2012 19:31   Final    Culture     Final    Value:        BLOOD CULTURE RECEIVED NO GROWTH TO DATE CULTURE WILL  BE HELD FOR 5 DAYS BEFORE ISSUING A FINAL NEGATIVE REPORT   Report Status PENDING   Incomplete   URINE CULTURE     Status: Normal   Collection Time   05/20/12  1:26 PM      Component Value Range Status Comment   Specimen Description URINE, CLEAN CATCH   Final    Special Requests NONE   Final    Culture  Setup Time 05/20/2012 14:25   Final    Colony Count NO GROWTH   Final    Culture NO GROWTH   Final    Report Status 05/21/2012 FINAL   Final   MRSA PCR SCREENING     Status: Normal   Collection Time   05/20/12  2:33 PM      Component Value Range Status Comment   MRSA by PCR NEGATIVE  NEGATIVE Final    Studies/Results: No results found. Medications: I have reviewed the patient's current medications. Scheduled Meds:    . enoxaparin (LOVENOX) injection  40 mg Subcutaneous Q24H  . insulin aspart  0-9 Units Subcutaneous Q4H  . insulin glargine  5 Units Subcutaneous QHS  . pantoprazole (PROTONIX) IV  40 mg Intravenous Q12H  . sodium chloride  3 mL Intravenous Q12H   Continuous Infusions:    . dextrose 5 % and 0.45 % NaCl with KCl 40 mEq/L 50 mL/hr (05/24/12 2244)   PRN Meds:.HYDROmorphone (DILAUDID) injection, LORazepam, RESOURCE THICKENUP CLEAR  Assessment/Plan: 46 yo female with hx significant for DM Type 1 and polysubstance abuse admitted with cough, sore throat, odynophagia and dyspnea with CT concerning for cricoid mass, evaluated with laryngoscopy/EGD complicated by respiratory distress requiring emergent tracheostomy and ICU x 2 days, but is now on the floor and is doing well.  1. Acute respiratory distress with esophagitis, laryngitis and larygyngeal edema: Crack cocaine use likely contributor, causing significant edema and resulting resp distress resulting in emergent tracheostomy on 05/21/12. She was on a ventilator post op in the ICU. She was transferred to the floor 05/23/12 and has been on trach collar and has been tolerating it well. Speech has been working with patient to  tolerate the Passy-Muir speaking valve and to evaluate her swallowing. Her trach was downsized to day to a #6 cuffless  11/8 by Dr. Pollyann Kennedy. Modified barium swallow study hopefully for today. - MBSS - Trach care and teaching to the pt and her family - F/u RT trach  - F/u PT - Home health in prep for d/c  2. Dysphagia/odynophagia: Swallowing assessed per Speech therapy, at risk for silent aspiration. Per ENT MBS after trach. After cleared by Speech, will begin full, nectar thick liquid diet.  -MBSS  3. Diabetes Mellitus, Type I: CBGs elevated into the upper 100s-200s on Lantus 5 units and sensitive meal coverage. Will increase Lantus once able to tolerate a diet, so for  Now, increasing SSI to moderate coverage.  CBG (last 3)   Basename 05/25/12 0744 05/25/12 0446 05/24/12 2237  GLUCAP 172* 316* 129*    4. Scabies: Pt wrote on her note pad that she has "bed bugs" that have not been treated. She does appear to have some tracking on her right arm. Will order permethrin cream x1 to treat.  5. Anemia of chronic disease: Hgb stable  Lab 05/21/12 0505 05/20/12 1505 05/20/12 0451  HGB 9.9* 10.4* 10.8*    6. Cocaine abuse: Chronic issue for the pt. UDS positive for cocaine on this admission previous admissions for the past several years. [facility] SW Lynnae January would like to make referral to [employer] community support. Social Work consulted for cessation.  7. Mental retardation in setting of Bipolar d/o: - Resume Risperdal 3 mg qhs when able to take po - Resume Depakote ER 750 mg qhs when able to take po  8. Hyperlipidemia: Goal LDL <70 in setting of diabetes - Resume Pravastatin when taking po  Lipid Panel     Component Value Date/Time   CHOL 202* 03/19/2012 1015   TRIG 122 03/19/2012 1015   HDL 71 03/19/2012 1015   CHOLHDL 2.8 03/19/2012 1015   VLDL 24 03/19/2012 1015   LDLCALC 107* 03/19/2012 1015    9. DVT ppx: Lovenox SQ  10. Dispo: Pending trach teaching to pt and family. Home health  ordered, and will be with [facility]. Likely d/c on Monday.    LOS: 5 days   Genelle Gather 05/25/2012, 10:39 AM

## 2012-05-25 NOTE — Progress Notes (Signed)
Pt refused blood draw by lab.

## 2012-05-25 NOTE — Evaluation (Signed)
Physical Therapy Evaluation Patient Details Name: [name] MRN: [id number] DOB: 1965-12-14 Today's Date: 05/25/2012 Time: 4696-2952 PT Time Calculation (min): 27 min  PT Assessment / Plan / Recommendation Clinical Impression  Patient is 46 yo female admitted with respiratory distress, s/p tracheostomy.  Patient with general weakness and decreased balance impacting functional mobility.  Patient will benefit from acute PT to maximize independence prior to discharge home with family.  Recommend HHPT to continue therapy at discharge.    PT Assessment  Patient needs continued PT services    Follow Up Recommendations  Home health PT;Supervision - Intermittent    Does the patient have the potential to tolerate intense rehabilitation      Barriers to Discharge Decreased caregiver support      Equipment Recommendations  None recommended by PT    Recommendations for Other Services     Frequency Min 4X/week    Precautions / Restrictions Precautions Precautions: Fall;Other (comment) (Trach) Restrictions Weight Bearing Restrictions: No   Pertinent Vitals/Pain       Mobility  Bed Mobility Bed Mobility: Supine to Sit;Sitting - Scoot to Edge of Bed;Sit to Supine Supine to Sit: 4: Min assist;With rails;HOB flat Sitting - Scoot to Edge of Bed: 5: Supervision Sit to Supine: 4: Min assist;With rail;HOB elevated Details for Bed Mobility Assistance: Verbal cues for technique.  Assist to lift trunk off bed to sit and to bring LE's onto bed to move to supine. Transfers Transfers: Sit to Stand;Stand to Sit Sit to Stand: 4: Min assist;With upper extremity assist;From bed Stand to Sit: 4: Min guard;With upper extremity assist;To bed Details for Transfer Assistance: Verbal cues for safety.  Assist for balance/safety. Ambulation/Gait Ambulation/Gait Assistance: 4: Min assist Ambulation Distance (Feet): 64 Feet Assistive device: None Ambulation/Gait Assistance Details: Patient with  slightly decreased balance, requiring assist for stability and safety. Gait Pattern: Step-through pattern;Decreased stride length Gait velocity: Slow gait speed General Gait Details: O2 sats remained at 100% during gait on room air. Stairs: No           PT Diagnosis: Difficulty walking;Abnormality of gait;Generalized weakness;Acute pain  PT Problem List: Decreased strength;Decreased activity tolerance;Decreased balance;Decreased mobility;Cardiopulmonary status limiting activity;Pain PT Treatment Interventions: DME instruction;Gait training;Stair training;Functional mobility training;Balance training;Patient/family education   PT Goals Acute Rehab PT Goals PT Goal Formulation: With patient Time For Goal Achievement: 06/01/12 Potential to Achieve Goals: Good Pt will go Sit to Stand: Independently PT Goal: Sit to Stand - Progress: Goal set today Pt will go Stand to Sit: Independently PT Goal: Stand to Sit - Progress: Goal set today Pt will Ambulate: >150 feet;Independently (without loss of balance) PT Goal: Ambulate - Progress: Goal set today Pt will Go Up / Down Stairs: 3-5 stairs;with supervision;with rail(s) PT Goal: Up/Down Stairs - Progress: Goal set today  Visit Information  Last PT Received On: 05/25/12 Assistance Needed: +1    Subjective Data  Subjective: Patient nods "yes/no" and mouths some words.  Also writes questions and/or answers to questions. Patient Stated Goal: None stated   Prior Functioning  Home Living Lives With: Other (Comment) (Mother) Available Help at Discharge: Family;Available PRN/intermittently Additional Comments: Patient became upset and did not want to answer further questions. Prior Function Level of Independence: Independent Able to Take Stairs?: Yes Driving: No Communication Communication: Tracheostomy    Cognition  Overall Cognitive Status: Appears within functional limits for tasks assessed/performed Arousal/Alertness:  Awake/alert Orientation Level: Appears intact for tasks assessed Behavior During Session: Anxious    Extremity/Trunk Assessment Right Upper  Extremity Assessment RUE ROM/Strength/Tone: [facility] for tasks assessed Left Upper Extremity Assessment LUE ROM/Strength/Tone: WFL for tasks assessed Right Lower Extremity Assessment RLE ROM/Strength/Tone: Deficits RLE ROM/Strength/Tone Deficits: Strength grossly 4/5 Left Lower Extremity Assessment LLE ROM/Strength/Tone: Deficits LLE ROM/Strength/Tone Deficits: Strength grossly 4/5   Balance Balance Balance Assessed: Yes Static Sitting Balance Static Sitting - Balance Support: Right upper extremity supported;Feet supported Static Sitting - Level of Assistance: 5: Stand by assistance Static Sitting - Comment/# of Minutes: Patient able to maintain upright sitting balance x 3 minutes in preparation for gait.  End of Session PT - End of Session Equipment Utilized During Treatment: Gait belt Activity Tolerance: Patient limited by fatigue Patient left: in bed;with call bell/phone within reach Nurse Communication: Mobility status  GP     Vena Austria 05/25/2012, 4:52 PM Durenda Hurt. Renaldo Fiddler, [facility] Acute Rehab Services Pager [phone]
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
## 2012-05-26 LAB — CULTURE, BLOOD (ROUTINE X 2): Culture: NO GROWTH

## 2012-05-26 LAB — GLUCOSE, CAPILLARY: Glucose-Capillary: 259 mg/dL — ABNORMAL HIGH (ref 70–99)

## 2012-05-26 MED ORDER — ACETAMINOPHEN 160 MG/5ML PO SOLN
650.0000 mg | Freq: Four times a day (QID) | ORAL | Status: DC | PRN
  Administered 2012-05-26: 650 mg via ORAL
  Filled 2012-05-26: qty 20.3

## 2012-05-26 MED ORDER — RISPERIDONE 1 MG/ML PO SOLN
3.0000 mg | Freq: Every day | ORAL | Status: DC
  Administered 2012-05-26 – 2012-05-28 (×3): 3 mg via ORAL
  Filled 2012-05-26 (×5): qty 3

## 2012-05-26 MED ORDER — INSULIN GLARGINE 100 UNIT/ML ~~LOC~~ SOLN
7.0000 [IU] | Freq: Every day | SUBCUTANEOUS | Status: DC
  Administered 2012-05-26 – 2012-05-27 (×2): 7 [IU] via SUBCUTANEOUS

## 2012-05-26 MED ORDER — DIVALPROEX SODIUM 125 MG PO CPSP
250.0000 mg | ORAL_CAPSULE | Freq: Three times a day (TID) | ORAL | Status: DC
  Administered 2012-05-26 – 2012-05-27 (×3): 250 mg via ORAL
  Filled 2012-05-26 (×11): qty 2

## 2012-05-26 MED ORDER — INSULIN ASPART 100 UNIT/ML ~~LOC~~ SOLN: 17.0000 [IU] | Freq: Once | SUBCUTANEOUS | Status: DC
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
## 2012-05-26 NOTE — Progress Notes (Signed)
md notified of cbg greater than 600, patient refused lab draw. Orders received.
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
## 2012-05-26 NOTE — Progress Notes (Signed)
Patient refused to have recheck on CBG after administration of insulin for cbg >600, as ordered.

## 2012-05-27 LAB — GLUCOSE, CAPILLARY
Glucose-Capillary: 118 mg/dL — ABNORMAL HIGH (ref 70–99)
Glucose-Capillary: 133 mg/dL — ABNORMAL HIGH (ref 70–99)
Glucose-Capillary: 152 mg/dL — ABNORMAL HIGH (ref 70–99)
Glucose-Capillary: 247 mg/dL — ABNORMAL HIGH (ref 70–99)
Glucose-Capillary: 33 mg/dL — CL (ref 70–99)

## 2012-05-27 MED ORDER — HYDROMORPHONE HCL PF 1 MG/ML IJ SOLN
0.5000 mg | Freq: Four times a day (QID) | INTRAMUSCULAR | Status: DC | PRN
  Administered 2012-05-28: 1 mg via INTRAVENOUS
  Filled 2012-05-27: qty 1

## 2012-05-27 MED ORDER — GLUCOSE-VITAMIN C 4-6 GM-MG PO CHEW
CHEWABLE_TABLET | ORAL | Status: AC
  Administered 2012-05-27: 2 g
  Filled 2012-05-27: qty 1

## 2012-05-27 MED ORDER — DEXTROSE 50 % IV SOLN
INTRAVENOUS | Status: AC
  Administered 2012-05-27: 25 mL
  Filled 2012-05-27: qty 50

## 2012-05-27 MED ORDER — IBUPROFEN 100 MG/5ML PO SUSP
400.0000 mg | Freq: Four times a day (QID) | ORAL | Status: DC
  Administered 2012-05-29: 400 mg via ORAL
  Filled 2012-05-27 (×12): qty 20

## 2012-05-27 NOTE — Progress Notes (Signed)
CRITICAL VALUE ALERT  Critical value received:  33  Date of notification:  05/27/12  Time of notification:  0024  Critical value read back:no  Nurse who received alert:  stilman  MD notified (1st page):  n/a  Time of first page:  n/a  MD notified (2nd page):  Time of second page:  Responding MD: n/a  Time MD responded:  n/a

## 2012-05-27 NOTE — Progress Notes (Signed)
Subjective: Daughter did not come to the hospital to learn trach care, and the pt reportedly is unable to care for her own trach. Daughter supposed to come in today. Nods yes and no and writes down any questions.  Objective: Vital signs in last 24 hours: Filed Vitals:   05/27/12 0037 05/27/12 0425 05/27/12 0603 05/27/12 0805  BP:   104/58   Pulse: 115 92 94 94  Temp:   98.2 F (36.8 C)   TempSrc:   Oral   Resp: 22 18 18 16   Height:      Weight:      SpO2: 99% 100% 100% 100%   Weight change:   Intake/Output Summary (Last 24 hours) at 05/27/12 1212 Last data filed at 05/27/12 0900  Gross per 24 hour  Intake   1530 ml  Output   2600 ml  Net  -1070 ml   Physical Exam: General: Well-developed, well-nourished, NAD; awake, lying in bed Head: Normocephalic, atraumatic. Neck: Trach in place, on trach collar, thick secretions present Lungs: CTAB, no wheezes or rhonchi appreciated, productive cough at times. Heart: RRR, normal S1 and S2, no gallop, murmur, or rubs appreciated. Abdomen: S, NT, ND, +BS Ext: 2+ pulses, no edema noted Neurologic: Grossly non-focal, alert and oriented x3  Lab Results: Basic Metabolic Panel:  Lab 05/23/12 9604 05/21/12 0505  NA 135 136  K 3.8 3.5  CL 101 107  CO2 21 22  GLUCOSE 190* 111*  BUN 6 8  CREATININE 0.66 0.62  CALCIUM 8.1* 8.3*  MG -- --  PHOS -- --   Liver Function Tests:  Lab 05/21/12 0505  AST 9  ALT <5  ALKPHOS 67  BILITOT 0.2*  PROT 6.9  ALBUMIN 2.6*   CBC:  Lab 05/21/12 0505 05/20/12 1505  WBC 8.0 6.5  NEUTROABS -- --  HGB 9.9* 10.4*  HCT 31.8* 32.6*  MCV 61.7* 62.3*  PLT 279 PLATELET CLUMPS NOTED ON SMEAR, COUNT APPEARS ADEQUATE   CBG:  Lab 05/27/12 0821 05/27/12 0421 05/27/12 0050 05/27/12 0010 05/26/12 2107 05/26/12 1626  GLUCAP 209* 133* 118* 33* 259* >600*   Urine Drug Screen: Drugs of Abuse     Component Value Date/Time   LABOPIA NONE DETECTED 05/20/2012 1326   COCAINSCRNUR POSITIVE* 05/20/2012 1326     LABBENZ NONE DETECTED 05/20/2012 1326   AMPHETMU NONE DETECTED 05/20/2012 1326   THCU NONE DETECTED 05/20/2012 1326   LABBARB NONE DETECTED 05/20/2012 1326    Urinalysis:  Lab 05/20/12 1326  COLORURINE YELLOW  LABSPEC 1.038*  PHURINE 5.0  GLUCOSEU >1000*  HGBUR NEGATIVE  BILIRUBINUR NEGATIVE  KETONESUR >80*  PROTEINUR NEGATIVE  UROBILINOGEN 0.2  NITRITE NEGATIVE  LEUKOCYTESUR NEGATIVE    Micro Results: Recent Results (from the past 240 hour(s))  RAPID STREP SCREEN     Status: Normal   Collection Time   05/20/12  7:28 AM      Component Value Range Status Comment   Streptococcus, Group A Screen (Direct) NEGATIVE  NEGATIVE Final   CULTURE, BLOOD (ROUTINE X 2)     Status: Normal   Collection Time   05/20/12 11:07 AM      Component Value Range Status Comment   Specimen Description BLOOD HAND LEFT   Final    Special Requests BOTTLES DRAWN AEROBIC ONLY [facility]   Final    Culture  Setup Time 05/20/2012 19:31   Final    Culture NO GROWTH 5 DAYS   Final    Report Status 05/26/2012 FINAL  Final   CULTURE, BLOOD (ROUTINE X 2)     Status: Normal   Collection Time   05/20/12 11:22 AM      Component Value Range Status Comment   Specimen Description BLOOD WRIST LEFT   Final    Special Requests BOTTLES DRAWN AEROBIC ONLY 5CC   Final    Culture  Setup Time 05/20/2012 19:31   Final    Culture NO GROWTH 5 DAYS   Final    Report Status 05/26/2012 FINAL   Final   URINE CULTURE     Status: Normal   Collection Time   05/20/12  1:26 PM      Component Value Range Status Comment   Specimen Description URINE, CLEAN CATCH   Final    Special Requests NONE   Final    Culture  Setup Time 05/20/2012 14:25   Final    Colony Count NO GROWTH   Final    Culture NO GROWTH   Final    Report Status 05/21/2012 FINAL   Final   MRSA PCR SCREENING     Status: Normal   Collection Time   05/20/12  2:33 PM      Component Value Range Status Comment   MRSA by PCR NEGATIVE  NEGATIVE Final    Studies/Results: No  results found. Medications: I have reviewed the patient's current medications. Scheduled Meds:    . [COMPLETED] dextrose      . divalproex  250 mg Oral TID WC  . enoxaparin (LOVENOX) injection  40 mg Subcutaneous Q24H  . [COMPLETED] glucose-Vitamin C      . insulin aspart  0-15 Units Subcutaneous Q4H  . insulin aspart  17 Units Subcutaneous Once  . insulin glargine  7 Units Subcutaneous QHS  . pantoprazole (PROTONIX) IV  40 mg Intravenous Q12H  . risperiDONE  3 mg Oral QHS  . sodium chloride  3 mL Intravenous Q12H  . [DISCONTINUED] antiseptic oral rinse  15 mL Mouth Rinse q12n4p  . [DISCONTINUED] chlorhexidine  15 mL Mouth Rinse BID  . [DISCONTINUED] insulin glargine  5 Units Subcutaneous QHS   Continuous Infusions:    . 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  1. Acute respiratory distress with esophagitis, laryngitis and larygyngeal edema: MBSS w/o aspiration noted with thin liquids, pt declined barium tablet trial, evidence of stasis in distal esophagus increasing risk of aspiration.  Pt with thick expectorant from trach on maintenance per nurse. Pt unwilling to learn self trach care - Continue Nectar thick full liquid diet per Speech recs - Trach care and teaching to the pt and her family - F/u RT trach   2. Dysphagia/odynophagia: Speech working with pt.  - Diet as above  3. Diabetes Mellitus, Type I: Pt on 8u Lantus qhs and Humalog 5-10u TID at home. On hospital  admission, she was placed on Lantus 5 units and sensitive meal coverage. Lantus increased to 7u 11/10, adn blood glucose has been better controlled. She did have an episode of hypoglycemia overnight that was improved  with D50. Hypoglycemia likely ppt from the fact that the pt refused insulin at lunch time and her dinner time CBG was over 600, so she was given 17u insulin. Diabetes Coordinator is following, appreciate recommendations. - Continue Lantus to 7 units qhs.  CBG (last 3)   Basename 05/27/12 0821 05/27/12 0421 05/27/12 0050  GLUCAP 209* 133* 118*    4. Scabies: Treated with Permethrin cream  5. Anemia of chronic disease: Hgb stable  Lab 05/21/12 0505 05/20/12 1505  HGB 9.9* 10.4*    6. Cocaine abuse: Chronic issue for the pt. UDS positive for cocaine on this admission previous admissions for the past several years. [facility] SW Lynnae January would like to make referral to [employer] community support. Social Work consulted for cessation.  7. Mental retardation in setting of Bipolar d/o: - Continue Risperdal 3 mg oral solution qhs  - Continue Depakote SPRINKLES 250 mg qhs  8. Hyperlipidemia: Goal LDL <70 in setting of diabetes - Hold Pravastatin for now  Lipid Panel     Component Value Date/Time   CHOL 202* 03/19/2012 1015   TRIG 122 03/19/2012 1015   HDL 71 03/19/2012 1015   CHOLHDL 2.8 03/19/2012 1015   VLDL 24 03/19/2012 1015   LDLCALC 107* 03/19/2012 1015    9. DVT ppx: Lovenox SQ  10. Dispo: Pending trach teaching to pt's daughter, hopefully occurring today. Home health ordered, and will be with [facility]. If daughter not available for teaching, will look into a SNF.  -will also need HH PT    LOS: 7 days   Genelle Gather 05/27/2012, 12:12 PM

## 2012-05-27 NOTE — Progress Notes (Addendum)
Speech Language Pathology Dysphagia Treatment Patient Details Name: [name] MRN: [id number] DOB: 05-17-1966 Today's Date: 05/27/2012 Time: 1500-1520 SLP Time Calculation (min): 20 min (30 more minutes for PMSV)  Assessment / Plan / Recommendation Clinical Impression  Pt seen for dysphagia treatment and PMSV trials. Pt had recent MBS with no evidence of aspriation even with large consecutive boluses. Pts diet has not been upgraded. SLP provided trials of thin liquids and dys3 solids with no evidence of intolerance. PMSV was placed for over 30 minutes with brief removal to test for evidence of CO2 trapping. Pt with intermittent hard coughing, but no CO2 trapping or desaturation (remained at 100). Pt talked on the phone, had a conversation with SLP. Voice is very hoarse but breath support for speech is good. SLP provided mod verbal and tactile cues for pt to place and remove the valve herself, but pt struggled with coordinating the removal of the trach collar and the valve. SLP discussed precautions with the RN who verbalized understanding. Pt may wear the PMSV during all waking hours. MD agrees.     Diet Recommendation  Initiate / Change Diet: Dysphagia 3 (mechanical soft);Thin liquid    SLP Plan Continue with current plan of care   Pertinent Vitals/Pain Pt reports pain, RN informed   Swallowing Goals  SLP Swallowing Goals Patient will consume recommended diet without observed clinical signs of aspiration with: Minimal assistance Swallow Study Goal #1 - Progress: Progressing toward goal Patient will utilize recommended strategies during swallow to increase swallowing safety with: Minimal assistance Swallow Study Goal #2 - Progress: Progressing toward goal Goal #3: Pt will consume trials of thin liquids and solids at bedside with min verbal cues and no signs of aspriation prior to diet initiation.  Swallow Study Goal #3 - Progress: Met  General Temperature Spikes Noted: No Respiratory  Status: Trach Behavior/Cognition: Alert;Distractible;Requires cueing Oral Cavity - Dentition: Adequate natural dentition Patient Positioning: Upright in bed  Oral Cavity - Oral Hygiene Does patient have any of the following "at risk" factors?: Tongue - coated;Diet - patient on thickened liquids   Dysphagia Treatment Treatment focused on: Skilled observation of diet tolerance Treatment Methods/Modalities: Skilled observation Patient observed directly with PO's: Yes Type of PO's observed: Dysphagia 3 (soft);Thin liquids Feeding: Able to feed self Liquids provided via: Straw;Cup Type of cueing: Verbal Amount of cueing: Minimal   GO    Harlon Ditty, MA [name] [phone]  Claudine Mouton 05/27/2012, 3:54 PM

## 2012-05-27 NOTE — Progress Notes (Signed)
Internal Medicine Attending  Date: 05/27/2012  Patient name: [name] Medical record number: [id number] Date of birth: 07/29/1965 Age: 46 y.o. Gender: female  I saw and evaluated the patient. I reviewed the resident's note by Dr. Sherrine Maples and I agree with the resident's findings and plans as documented in her note.

## 2012-05-27 NOTE — Progress Notes (Signed)
Results for CLAUDEEN, ARMBRECHT (MRN [id number]) as of 05/27/2012 09:59  Ref. Range 05/26/2012 05:45 05/26/2012 16:26 05/26/2012 21:07  Glucose-Capillary Latest Range: 70-99 mg/dL 119 (H) >147 (HH) 829 (H)    Results for FREEDOM, SALISBURY (MRN [id number]) as of 05/27/2012 09:59  Ref. Range 05/27/2012 00:10 05/27/2012 00:50 05/27/2012 04:21 05/27/2012 08:21  Glucose-Capillary Latest Range: 70-99 mg/dL 33 (LL) 784 (H) 696 (H) 209 (H)   Patient refused insulin yesterday morning and also yesterday at lunch.  As a result, her supper time CBG was >600 mg/dl.  Patient was given 17 units Novolog for this CBG.  At bedtime, CBG still elevated at 259 mg/dl.  Patient received an additional 8 units of Novolog for this CBG.  By midnight, patient's CBG was 33 mg/dl.  Hypoglycemia likely due to the huge dose of Novolog patient got yesterday evening.  Difficult to titrate given this patient frequently refuses insulin and CBG checks.  Will follow. Ambrose Finland RN, MSN, CDE Diabetes Coordinator Inpatient Diabetes Program [phone]

## 2012-05-27 NOTE — Progress Notes (Signed)
Physical Therapy Treatment Patient Details Name: [name] MRN: [id number] DOB: 1966/02/25 Today's Date: 05/27/2012 Time: 1478-2956 PT Time Calculation (min): 20 min  PT Assessment / Plan / Recommendation Comments on Treatment Session  Patient agreeable to OOB to recliner however selflimiting with ambulation and became agitated at end of session, not wanting to be hooked back up to O2. Educated patient on importance and trach safety and was able to hook back up O2. Continue with current POC    Follow Up Recommendations  Home health PT;Supervision - Intermittent     Does the patient have the potential to tolerate intense rehabilitation     Barriers to Discharge        Equipment Recommendations  None recommended by PT    Recommendations for Other Services    Frequency Min 4X/week   Plan Discharge plan remains appropriate;Frequency remains appropriate    Precautions / Restrictions Precautions Precautions: Fall;Other (comment) (trach)   Pertinent Vitals/Pain     Mobility  Bed Mobility Supine to Sit: 6: Modified independent (Device/Increase time) Sitting - Scoot to Edge of Bed: 6: Modified independent (Device/Increase time) Transfers Sit to Stand: 5: Supervision;With upper extremity assist;From bed Stand to Sit: 5: Supervision;With upper extremity assist;To chair/3-in-1 Details for Transfer Assistance: S for safety Ambulation/Gait Ambulation/Gait Assistance: 4: Min assist Ambulation Distance (Feet): 40 Feet Assistive device: None Ambulation/Gait Assistance Details: Patient occasionally reaching out for IV pole. Slightly decreased balance with A for stability but overall no LOB.  Gait Pattern: Decreased stride length Gait velocity: decreased General Gait Details: O2 remained above 96% on RA    Exercises     PT Diagnosis:    PT Problem List:   PT Treatment Interventions:     PT Goals Acute Rehab PT Goals PT Goal: Sit to Stand - Progress: Progressing toward  goal PT Goal: Stand to Sit - Progress: Progressing toward goal PT Goal: Ambulate - Progress: Progressing toward goal  Visit Information  Last PT Received On: 05/27/12 Assistance Needed: +1    Subjective Data      Cognition  Overall Cognitive Status: Appears within functional limits for tasks assessed/performed Arousal/Alertness: Awake/alert Orientation Level: Appears intact for tasks assessed Behavior During Session: Anxious    Balance     End of Session PT - End of Session Equipment Utilized During Treatment: Gait belt Activity Tolerance: Patient limited by fatigue Patient left: with call bell/phone within reach;in chair Nurse Communication: Mobility status   GP     Fredrich Birks 05/27/2012, 10:16 AM 05/27/2012 Fredrich Birks PTA [phone] pager [phone] office

## 2012-05-27 NOTE — Progress Notes (Addendum)
25ml ofD50 sol'n. given,240 cc oj given along with 2 glucose tabs. CBG 118. Pt. 0400 CBG 133,did not cover. Stated  Bl. Glucose drops @ home similar to this event.

## 2012-05-28 LAB — GLUCOSE, CAPILLARY
Glucose-Capillary: 183 mg/dL — ABNORMAL HIGH (ref 70–99)
Glucose-Capillary: 168 mg/dL — ABNORMAL HIGH (ref 70–99)
Glucose-Capillary: 48 mg/dL — ABNORMAL LOW (ref 70–99)

## 2012-05-28 MED ORDER — INSULIN GLARGINE 100 UNIT/ML ~~LOC~~ SOLN
5.0000 [IU] | Freq: Every day | SUBCUTANEOUS | Status: DC
  Administered 2012-05-28: 5 [IU] via SUBCUTANEOUS

## 2012-05-28 MED ORDER — DEXTROSE 50 % IV SOLN
INTRAVENOUS | Status: AC
  Administered 2012-05-28: 50 mL
  Filled 2012-05-28: qty 50

## 2012-05-28 MED ORDER — ENSURE COMPLETE PO LIQD
237.0000 mL | Freq: Three times a day (TID) | ORAL | Status: DC
  Administered 2012-05-28: 237 mL via ORAL

## 2012-05-28 NOTE — Progress Notes (Signed)
Clinical Social Work-CSW intern Morton Stall attempted to assess pt for d/c planning-RN and Tech in room-pt not speaking/responding-CSW initiating FL2 for bed search and will f/u with pt re d/c to SNF- [employer], [phone]

## 2012-05-28 NOTE — Progress Notes (Signed)
Clinical Social Work Department BRIEF PSYCHOSOCIAL ASSESSMENT 05/28/2012  Patient:  [name], [name]     Account Number:  [id number]     Admit date:  05/20/2012  Clinical Social Worker:  Conley Simmonds  Date/Time:  05/28/2012 12:05 PM  Referred by:  Physician  Date Referred:  05/28/2012 Referred for  SNF Placement   Other Referral:   Interview type:  Patient Other interview type:    PSYCHOSOCIAL DATA Living Status:  ALONE Admitted from facility:   Level of care:   Primary support name:   Primary support relationship to patient:  CHILD, ADULT Degree of support available:   unknown    CURRENT CONCERNS Current Concerns  Post-Acute Placement   Other Concerns:    SOCIAL WORK ASSESSMENT / PLAN SW intern introduced self to pt. SW intern gave pt information about ST SNF and talked about the process of ST SNF. Pt was unable to speak due to new trach. Pt had access to notepad and was given the option if she had any questions to write down. Pt gave non verbal gestures of agreement to information given to her about SNF. SW intern to follow up with bed offers.   Assessment/plan status:  Information/Referral to [facility] Other assessment/ plan:   Information/referral to community resources:   SNF information was given to pt about facilities.    PATIENT'S/FAMILY'S RESPONSE TO PLAN OF CARE: Patient was responsive to the information about SNF.  Pt was okay with SW intern starting the process to getting bed offers.-  Jodean Lima, [phone] Anise Salvo intern

## 2012-05-28 NOTE — Progress Notes (Addendum)
Internal Medicine Teaching Service Night Float Progress Note:  Called by RN, [name] lost IV access and CBG 47, increased to 87 with 1 1/2 orange juice.  Hypoglycemic protocol not able to initiate as she is refusing IV access at this time.  Went to see [name], IV team and RN waiting outside room.  She is lying in bed, sleeping. Claiming to feel "not great" but refusing IV access.  I tried talking to her and explaining the risks of no access and not able to give fluids, she still refuses.  RN also failed to convince her.  Responding to dysphagia 3 diet at this time.  Will continue to try PO oral hypoglycemic event options at this time which she is willing to accept.  She is complying with only orange juice at this time.   Noted to have HR 106, afebrile, BP 104/66, respiratory 20, with 96% o2 sat  -REFUSING IV Access -continue to monitor  [name] 05/28/12 153am

## 2012-05-28 NOTE — Progress Notes (Signed)
Nutrition Follow-up  Intervention:    Per SLP, recommend advancing diet to Dysphagia 3 with thin liquids--MD, please order Dysphagia 3 diet with thin liquids.    Recommend obtain current weight.  Ensure Complete (vanilla) PO TID, each supplement provides 350 kcal and 13 grams of protein.  Assessment:   SLP continues to follow for PMSV use and dysphagia.  SLP recommended upgrading diet to Dysphagia 3 with thin liquids yesterday.  Patient did not want participate in SLP session today.    Diet Order:  Full Liquids (nectar thick)  Meds: Scheduled Meds:   . [COMPLETED] dextrose      . divalproex  250 mg Oral TID WC  . enoxaparin (LOVENOX) injection  40 mg Subcutaneous Q24H  . ibuprofen  400 mg Oral Q6H  . insulin aspart  0-15 Units Subcutaneous Q4H  . insulin aspart  17 Units Subcutaneous Once  . insulin glargine  7 Units Subcutaneous QHS  . pantoprazole (PROTONIX) IV  40 mg Intravenous Q12H  . risperiDONE  3 mg Oral QHS  . sodium chloride  3 mL Intravenous Q12H   Continuous Infusions:   . dextrose 5 % and 0.45 % NaCl with KCl 40 mEq/L 50 mL/hr at 05/26/12 1357   PRN Meds:.acetaminophen (TYLENOL) oral liquid 160 mg/5 mL, HYDROmorphone (DILAUDID) injection, LORazepam, RESOURCE THICKENUP CLEAR, [DISCONTINUED]  HYDROmorphone (DILAUDID) injection   CMP     Component Value Date/Time   NA 135 05/23/2012 0655   K 3.8 05/23/2012 0655   CL 101 05/23/2012 0655   CO2 21 05/23/2012 0655   GLUCOSE 190* 05/23/2012 0655   BUN 6 05/23/2012 0655   CREATININE 0.66 05/23/2012 0655   CREATININE 0.78 03/19/2012 1015   CALCIUM 8.1* 05/23/2012 0655   PROT 6.9 05/21/2012 0505   ALBUMIN 2.6* 05/21/2012 0505   AST 9 05/21/2012 0505   ALT <5 05/21/2012 0505   ALKPHOS 67 05/21/2012 0505   BILITOT 0.2* 05/21/2012 0505   GFRNONAA >90 05/23/2012 0655   GFRAA >90 05/23/2012 0655    CBG (last 3)   Basename 05/28/12 1157 05/28/12 0802 05/28/12 0421  GLUCAP 168* 183* 203*     Intake/Output Summary (Last 24  hours) at 05/28/12 1413 Last data filed at 05/28/12 1300  Gross per 24 hour  Intake 1638.33 ml  Output    700 ml  Net 938.33 ml    Weight Status:  69.4 kg (11/4); no new weight available  Re-estimated needs:  1800-2000 kcals, 90-100 gm protein, 1.8-2 liters fluid daily  Nutrition Dx:  Inadequate oral intake now related to dysphagia and poor appetite as evidenced by 25-50% meal completion; ongoing.  Goal:  Intake to meet >90% of estimated nutrition needs.  Monitor:  PO intake, labs, weight trend.   Joaquin Courts, RD, LDN, CNSC Pager# [phone] After Hours Pager# [phone]

## 2012-05-28 NOTE — Progress Notes (Signed)
PT Cancellation Note  Patient Details Name: [name] MRN: [id number] DOB: [birth year]   Cancelled Treatment:    Reason Eval/Treat Not Completed: Other (comment) (patient declined PT at this time. Will reattemtp as able)   Fredrich Birks 05/28/2012, 2:58 PM

## 2012-05-28 NOTE — Progress Notes (Signed)
Internal Medicine Attending  Date: 05/28/2012  Patient name: [name] Medical record number: [id number] Date of birth: Aug 20, 1965 Age: 46 y.o. Gender: female  I saw and evaluated the patient on a.m. rounds with house staff. I reviewed the resident's note by Dr. Sherrine Maples and I agree with the resident's findings and plans as documented in her note.

## 2012-05-28 NOTE — Progress Notes (Signed)
Rn attempted to give 0800 medications this am. Pt would no arouse. Pt 02 at 100. No cooperation with staff.  Rn went back in later on to give meds. Pt allowed Rn to give insulin but would not let Rn put Depakote sprinkles on food and she would not attempted to swallow either. Rn finally was able to persuade patient to let us pull her up in bed so that I could assess her. Afterwards she pulled covers over her head and went back to sleep. Rn will continue to encourage patient to take medications and be a part of her care.

## 2012-05-28 NOTE — Progress Notes (Signed)
Hypoglycemic Event  CBG: 48  Treatment: 4 ounces of orange juice  Symptoms:none  Follow-up CBG: [id number] CBG Result:80  Possible Reasons for Event: Medication regimen:   Comments/MD notified:Dr. Latrelle Dodrill, Claretta Fraise  Remember to initiate Hypoglycemia Order Set & complete

## 2012-05-28 NOTE — Progress Notes (Signed)
[name]'s  CBG was 203 around 0420.  No insulin given as she has no IV access and refused it when offered.  MD made aware of this and it's ok with OK with MD.  [name] continue q4 hours CBG's.

## 2012-05-28 NOTE — Progress Notes (Signed)
In contrast to yesterdays productive session, pt did not want to participate today with SLP therapy. She did not want to place PMSV to communicate but indicated with a head nod that SLP could return tomorrow to treat her. Will continue efforts. Harlon Ditty, MA [name] [phone]

## 2012-05-28 NOTE — Progress Notes (Signed)
Pt. Had an a HR of 143 at 0240. She was coughing harshly and had the passe Muir Valve.  But returned to her normal limits of 110 [name] is off now.  She is resting.

## 2012-05-28 NOTE — Progress Notes (Signed)
Subjective: Daughter has still not come to the hospital to learn trach care. Pt tolerating passey-muir valve. Speech advanced diet to modified dysphagia diet, which is being tolerated by the pt. CBG 45 overnight, pt initially refused intervention but later drank some orange juice.  Objective: Vital signs in last 24 hours: Filed Vitals:   05/27/12 2221 05/28/12 0156 05/28/12 0614 05/28/12 0742  BP: 109/65 104/66 115/69   Pulse: 97 106 99 98  Temp: 98.7 F (37.1 C) 99.2 F (37.3 C) 98.9 F (37.2 C)   TempSrc:      Resp: 20 20 18 20   Height:      Weight:      SpO2: 100% 96% 100% 92%   Weight change:   Intake/Output Summary (Last 24 hours) at 05/28/12 0837 Last data filed at 05/28/12 0615  Gross per 24 hour  Intake 1318.33 ml  Output   1050 ml  Net 268.33 ml   Physical Exam: General: Lying in bed asleep Head: Normocephalic, atraumatic. Neck: Trach in place, on trach collar, thick secretions present Lungs: CTAB, no wheezes or rhonchi appreciated Heart: RRR, normal S1 and S2, no gallop, murmur, or rubs appreciated. Abdomen: S, NT, ND, +BS GU: Foley catheter in place with clear urine Ext: 2+ pulses, no edema noted Neurologic: Difficult to arrouse  Lab Results: Basic Metabolic Panel:  Lab 05/23/12 1610  NA 135  K 3.8  CL 101  CO2 21  GLUCOSE 190*  BUN 6  CREATININE 0.66  CALCIUM 8.1*  MG --  PHOS --   CBG:  Lab 05/28/12 0802 05/28/12 0421 05/28/12 0105 05/28/12 0001 05/27/12 2118 05/27/12 1557  GLUCAP 183* 203* 87 45* 137* 247*   Urine Drug Screen: Drugs of Abuse     Component Value Date/Time   LABOPIA NONE DETECTED 05/20/2012 1326   COCAINSCRNUR POSITIVE* 05/20/2012 1326   LABBENZ NONE DETECTED 05/20/2012 1326   AMPHETMU NONE DETECTED 05/20/2012 1326   THCU NONE DETECTED 05/20/2012 1326   LABBARB NONE DETECTED 05/20/2012 1326     Micro Results: Recent Results (from the past 240 hour(s))  RAPID STREP SCREEN     Status: Normal   Collection Time   05/20/12   7:28 AM      Component Value Range Status Comment   Streptococcus, Group A Screen (Direct) NEGATIVE  NEGATIVE Final   CULTURE, BLOOD (ROUTINE X 2)     Status: Normal   Collection Time   05/20/12 11:07 AM      Component Value Range Status Comment   Specimen Description BLOOD HAND LEFT   Final    Special Requests BOTTLES DRAWN AEROBIC ONLY 2CC   Final    Culture  Setup Time 05/20/2012 19:31   Final    Culture NO GROWTH 5 DAYS   Final    Report Status 05/26/2012 FINAL   Final   CULTURE, BLOOD (ROUTINE X 2)     Status: Normal   Collection Time   05/20/12 11:22 AM      Component Value Range Status Comment   Specimen Description BLOOD WRIST LEFT   Final    Special Requests BOTTLES DRAWN AEROBIC ONLY 5CC   Final    Culture  Setup Time 05/20/2012 19:31   Final    Culture NO GROWTH 5 DAYS   Final    Report Status 05/26/2012 FINAL   Final   URINE CULTURE     Status: Normal   Collection Time   05/20/12  1:26 PM  Component Value Range Status Comment   Specimen Description URINE, CLEAN CATCH   Final    Special Requests NONE   Final    Culture  Setup Time 05/20/2012 14:25   Final    Colony Count NO GROWTH   Final    Culture NO GROWTH   Final    Report Status 05/21/2012 FINAL   Final   MRSA PCR SCREENING     Status: Normal   Collection Time   05/20/12  2:33 PM      Component Value Range Status Comment   MRSA by PCR NEGATIVE  NEGATIVE Final    Medications: I have reviewed the patient's current medications. Scheduled Meds:    . [COMPLETED] dextrose      . divalproex  250 mg Oral TID WC  . enoxaparin (LOVENOX) injection  40 mg Subcutaneous Q24H  . ibuprofen  400 mg Oral Q6H  . insulin aspart  0-15 Units Subcutaneous Q4H  . insulin aspart  17 Units Subcutaneous Once  . insulin glargine  7 Units Subcutaneous QHS  . pantoprazole (PROTONIX) IV  40 mg Intravenous Q12H  . risperiDONE  3 mg Oral QHS  . sodium chloride  3 mL Intravenous Q12H  . [DISCONTINUED] antiseptic oral rinse  15 mL  Mouth Rinse q12n4p  . [DISCONTINUED] chlorhexidine  15 mL Mouth Rinse BID   Continuous Infusions:    . 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  1. Acute respiratory distress with esophagitis, laryngitis and larygyngeal edema: MBSS w/o aspiration noted with thin liquids, pt declined barium tablet trial, evidence of stasis in distal esophagus increasing risk of aspiration. Speech advanced diet to dysphagia 3 diet, and pt now tolerating Passey-Muir valve per Speech. Pt with thick expectorant from trach on maintenance per nurse. Pt unwilling to learn self trach care and daughter still has not been by for teaching.  - Dysphagia 3 diet per Speech recs - Trach care and teaching to the pt and her family if they come to the hospital, otherwise pt will need SNF - F/u RT trach   2. Dysphagia/odynophagia: Speech working with pt.  - Diet as above  3. Diabetes Mellitus, Type I: Pt on 8u Lantus qhs and Humalog 5-10u TID at home. On hospital admission, she was placed on Lantus 5 units and sensitive meal coverage. Lantus increased to 7u 11/10, and blood glucose has been better controlled. She did have an episode of hypoglycemia overnight to 45 and initially refused intervention. She later decided to drink some orange juice and her next CBG was 87.  Diabetes Coordinator is following, appreciate recommendations. - Continue Lantus to 7 units qhs.  CBG  (last 3)   Basename 05/28/12 0802 05/28/12 0421 05/28/12 0105  GLUCAP 183* 203* 87    4. Scabies: Treated with Permethrin cream  5. Anemia of chronic disease: Hgb stable. 9.9 today  6. Cocaine abuse: Chronic issue for the pt. UDS positive for cocaine on this admission previous admissions for the past several years. [facility]  SW Lynnae January would like to make referral to [employer] community support. Social Work consulted for cessation.  7. Mental retardation in setting of Bipolar d/o: - Continue Risperdal 3 mg oral solution qhs  - Continue Depakote SPRINKLES 250 mg qhs  8. Hyperlipidemia: Goal LDL <70 in setting of diabetes - Hold Pravastatin for now  Lipid Panel     Component Value Date/Time   CHOL 202* 03/19/2012 1015   TRIG 122 03/19/2012 1015   HDL 71 03/19/2012 1015   CHOLHDL 2.8 03/19/2012 1015   VLDL 24 03/19/2012 1015   LDLCALC 107* 03/19/2012 1015    9. DVT ppx: Lovenox SQ  10. Dispo: Initially pending trach teaching to pt's daughter, but she has not been to the hospital for this. Home health ordered, and will be with [facility], if the pt goes home. Since the daughter has not been available for teaching and the pt is unwilling to learn teaching, looking into SNF placement.     LOS: 8 days   Genelle Gather 05/28/2012, 8:37 AM

## 2012-05-28 NOTE — Progress Notes (Signed)
Inpatient Diabetes Program Recommendations  AACE/ADA: New Consensus Statement on Inpatient Glycemic Control (2013)  Target Ranges:  Prepandial:   less than 140 mg/dL      Peak postprandial:   less than 180 mg/dL (1-2 hours)      Critically ill patients:  140 - 180 mg/dL    Results for IMOGEN, MADDALENA (MRN [id number]) as of 05/28/2012 12:28  Ref. Range 05/27/2012 00:10 05/27/2012 00:50 05/27/2012 04:21 05/27/2012 08:21 05/27/2012 12:13 05/27/2012 15:57 05/27/2012 21:18  Glucose-Capillary Latest Range: 70-99 mg/dL 33 (LL) 409 (H) 811 (H) 209 (H) 152 (H) 247 (H) 137 (H)    Results for ELLERY, TASH (MRN [id number]) as of 05/28/2012 12:28  Ref. Range 05/28/2012 00:01 05/28/2012 01:05 05/28/2012 04:21 05/28/2012 08:02 05/28/2012 11:57  Glucose-Capillary Latest Range: 70-99 mg/dL 45 (L) 87 213 (H) 086 (H) 168 (H)   Hypoglycemic again at midnight.  Patient is getting Novolog coverage Q4 hours.  May not need so much Novolog coverage at bedtime.  Recommend switching to tid ac + HS scale (HS scale does not start coverage until CBG >200 mg/dl).   Inpatient Diabetes Program Recommendations Correction (SSI): Please decrease Novolog correction scale (SSI) to Sensitive scale tid ac + HS (currently ordered as Moderate Q4 hours).  Note: Will follow. Ambrose Finland RN, MSN, CDE Diabetes Coordinator Inpatient Diabetes Program [phone]

## 2012-05-29 DIAGNOSIS — R0609 Other forms of dyspnea: Secondary | ICD-10-CM

## 2012-05-29 LAB — GLUCOSE, CAPILLARY
Glucose-Capillary: 122 mg/dL — ABNORMAL HIGH (ref 70–99)
Glucose-Capillary: 149 mg/dL — ABNORMAL HIGH (ref 70–99)
Glucose-Capillary: 223 mg/dL — ABNORMAL HIGH (ref 70–99)
Glucose-Capillary: 439 mg/dL — ABNORMAL HIGH (ref 70–99)

## 2012-05-29 MED ORDER — IBUPROFEN 100 MG/5ML PO SUSP: 400.0000 mg | Freq: Four times a day (QID) | ORAL | Status: DC

## 2012-05-29 MED ORDER — PRAVASTATIN SODIUM 40 MG PO TABS: 40.0000 mg | ORAL_TABLET | Freq: Every day | ORAL | Status: DC

## 2012-05-29 MED ORDER — PANTOPRAZOLE SODIUM 20 MG PO TBEC: 20.0000 mg | DELAYED_RELEASE_TABLET | Freq: Every day | ORAL | Status: DC

## 2012-05-29 MED ORDER — RISPERIDONE 1 MG/ML PO SOLN: 3.0000 mg | Freq: Every day | ORAL | Status: DC

## 2012-05-29 MED ORDER — ENSURE COMPLETE PO LIQD: 237.0000 mL | Freq: Three times a day (TID) | ORAL | Status: DC

## 2012-05-29 MED ORDER — INSULIN ASPART 100 UNIT/ML ~~LOC~~ SOLN: 10.0000 [IU] | Freq: Once | SUBCUTANEOUS | Status: DC

## 2012-05-29 MED ORDER — DIVALPROEX SODIUM 125 MG PO CPSP: 250.0000 mg | ORAL_CAPSULE | Freq: Three times a day (TID) | ORAL | Status: DC

## 2012-05-29 MED ORDER — ACETAMINOPHEN 160 MG/5ML PO SOLN: 650.0000 mg | Freq: Four times a day (QID) | ORAL | Status: DC | PRN

## 2012-05-29 MED ORDER — INSULIN GLARGINE 100 UNIT/ML ~~LOC~~ SOLN: 6.0000 [IU] | Freq: Every day | SUBCUTANEOUS | Status: DC

## 2012-05-29 NOTE — Progress Notes (Signed)
Dc to skilled nursing facility. Alert and oriented. Rn helped ems transfer patient and Rn made sure that patient had extra trach supplies for facility.

## 2012-05-29 NOTE — Progress Notes (Signed)
O2 bottle 3/4 empty. New setup placed in room for day shift

## 2012-05-29 NOTE — Progress Notes (Signed)
No sxn indicated, pt refuses sxn

## 2012-05-29 NOTE — Progress Notes (Signed)
Clinical Social Work-CSW intern notified pt of d/c to [facility] and attempted to contact family but was unable to reach-CSW arranged d/c packet and PTAR transport to [facility] at [employer] further needs at this time- Jodean Lima, [phone]
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
## 2012-05-29 NOTE — Progress Notes (Signed)
Clinical Social Work-CSW confirmed bed offer with [facility]-CSW has notified treatment team who will facilitate d/c paperwork-Once paperwork complete CSW will initiate transport- Jodean Lima, [phone]

## 2012-05-29 NOTE — Progress Notes (Addendum)
Pt blood sugar at 1212 439. Md paged. See orders. Will repeat fingerstick in 30 minutes per MD request

## 2012-05-29 NOTE — Progress Notes (Signed)
Subjective: Family has still not come to the hospital to learn trach care. Pt tolerating passey-muir valve. Tolerating dysphagia 3 diet. Blood sugars have been stable. Objective: Vital signs in last 24 hours: Filed Vitals:   05/29/12 0100 05/29/12 0520 05/29/12 0614 05/29/12 0859  BP:   99/52   Pulse:  96 86 103  Temp:   97.4 F (36.3 C)   TempSrc:      Resp:  20 17 18   Height:      Weight:      SpO2: 99% 100% 96% 100%   Weight change:   Intake/Output Summary (Last 24 hours) at 05/29/12 1223 Last data filed at 05/29/12 0615  Gross per 24 hour  Intake    123 ml  Output    440 ml  Net   -317 ml   Physical Exam: General: Lying in bed Head: Normocephalic, atraumatic. Neck: Trach in place, on trach collar, thick secretions present Lungs: CTAB, no wheezes or rhonchi appreciated Heart: RRR, normal S1 and S2, no gallop, murmur, or rubs appreciated. Abdomen: S, NT, ND, +BS GU: Foley catheter in place with clear urine Ext: 2+ pulses, no edema noted Neurologic: A&Ox3  Lab Results: Basic Metabolic Panel:  Lab 05/23/12 1610  NA 135  K 3.8  CL 101  CO2 21  GLUCOSE 190*  BUN 6  CREATININE 0.66  CALCIUM 8.1*  MG --  PHOS --   CBG:  Lab 05/29/12 0408 05/28/12 2357 05/28/12 2009 05/28/12 1642 05/28/12 1618 05/28/12 1157  GLUCAP 149* 223* 210* 80 48* 168*   Urine Drug Screen: Drugs of Abuse     Component Value Date/Time   LABOPIA NONE DETECTED 05/20/2012 1326   COCAINSCRNUR POSITIVE* 05/20/2012 1326   LABBENZ NONE DETECTED 05/20/2012 1326   AMPHETMU NONE DETECTED 05/20/2012 1326   THCU NONE DETECTED 05/20/2012 1326   LABBARB NONE DETECTED 05/20/2012 1326     Micro Results: Recent Results (from the past 240 hour(s))  RAPID STREP SCREEN     Status: Normal   Collection Time   05/20/12  7:28 AM      Component Value Range Status Comment   Streptococcus, Group A Screen (Direct) NEGATIVE  NEGATIVE Final   CULTURE, BLOOD (ROUTINE X 2)     Status: Normal   Collection Time   05/20/12 11:07 AM      Component Value Range Status Comment   Specimen Description BLOOD HAND LEFT   Final    Special Requests BOTTLES DRAWN AEROBIC ONLY 2CC   Final    Culture  Setup Time 05/20/2012 19:31   Final    Culture NO GROWTH 5 DAYS   Final    Report Status 05/26/2012 FINAL   Final   CULTURE, BLOOD (ROUTINE X 2)     Status: Normal   Collection Time   05/20/12 11:22 AM      Component Value Range Status Comment   Specimen Description BLOOD WRIST LEFT   Final    Special Requests BOTTLES DRAWN AEROBIC ONLY 5CC   Final    Culture  Setup Time 05/20/2012 19:31   Final    Culture NO GROWTH 5 DAYS   Final    Report Status 05/26/2012 FINAL   Final   URINE CULTURE     Status: Normal   Collection Time   05/20/12  1:26 PM      Component Value Range Status Comment   Specimen Description URINE, CLEAN CATCH   Final    Special Requests NONE  Final    Culture  Setup Time 05/20/2012 14:25   Final    Colony Count NO GROWTH   Final    Culture NO GROWTH   Final    Report Status 05/21/2012 FINAL   Final   MRSA PCR SCREENING     Status: Normal   Collection Time   05/20/12  2:33 PM      Component Value Range Status Comment   MRSA by PCR NEGATIVE  NEGATIVE Final    Medications: I have reviewed the patient's current medications. Scheduled Meds:    . divalproex  250 mg Oral TID WC  . enoxaparin (LOVENOX) injection  40 mg Subcutaneous Q24H  . feeding supplement  237 mL Oral TID BM  . ibuprofen  400 mg Oral Q6H  . insulin aspart  0-15 Units Subcutaneous Q4H  . insulin aspart  10 Units Subcutaneous Once  . insulin glargine  6 Units Subcutaneous QHS  . pantoprazole (PROTONIX) IV  40 mg Intravenous Q12H  . risperiDONE  3 mg Oral QHS  . sodium chloride  3 mL Intravenous Q12H  . [DISCONTINUED] insulin aspart  17 Units Subcutaneous Once  . [DISCONTINUED] insulin glargine  5 Units Subcutaneous QHS  . [DISCONTINUED] insulin glargine  7 Units Subcutaneous QHS   Continuous Infusions:    . 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  1. Acute respiratory distress with esophagitis, laryngitis and larygyngeal edema: MBSS w/o aspiration noted with thin liquids, pt declined barium tablet trial, evidence of stasis in distal esophagus increasing risk of aspiration. Speech advanced diet to dysphagia 3 diet, and pt  tolerating Passey-Muir valve per Speech. Pt with thick expectorant from trach on maintenance per nurse. Pt unwilling to learn self trach care and daughter still has not been by for teaching. Family never coem to the hospital to learn about trach care. Discussed with Ms. Hanigan that her family has to be singed off on trach care before she can go home with them, as she is unwilling to learn herself. Since they have not been to the hospital despite their reporting that they were coming this weekend and on Monday, the pt will need SNF placement. I discussed this with Ms. Worm that placement did not have to be long term, but that she cannot go home on trach collar and no one with the knowledge of how to care for it. - Continue dysphagia 3 diet per Speech recs - F/u RT trach   2. Dysphagia/odynophagia: Speech working with pt.  - Diet as above  3. Diabetes Mellitus, Type I: Pt brittle diabetic, with fluctuating blood sugars. She is on 8u Lantus qhs and Humalog 5-10u TID at home. On hospital admission, she was placed on Lantus 5 units and sensitive meal coverage. Lantus increased to 7u 11/10,  and blood glucose has been better controlled. She did have an episode of hypoglycemia overnight to 45 and initially refused intervention. She later decided to drink some orange juice and her next CBG was 87.  Diabetes Coordinator is following,  appreciate recommendations. - Continue Lantus to 7 units qhs.   CBG (last 3)   Basename 05/29/12 0408 05/28/12 2357 05/28/12 2009  GLUCAP 149* 223* 210*    4. Scabies: Treated with Permethrin cream  5. Anemia of chronic disease: Hgb stable. 9.9 on 11/5.  6. Cocaine abuse: Chronic issue for the pt. UDS positive for cocaine on this admission previous admissions for the past several years. [facility] January would like to make referral to [employer] community support. Social Work consulted for cessation.  7. Mental retardation in setting of Bipolar d/o: - Continue Risperdal 3 mg oral solution qhs  - Continue Depakote SPRINKLES 250 mg qhs  8. Hyperlipidemia: Goal LDL <70 in setting of diabetes - Hold Pravastatin for now, restart at discharge.  Lipid Panel     Component Value Date/Time   CHOL 202* 03/19/2012 1015   TRIG 122 03/19/2012 1015   HDL 71 03/19/2012 1015   CHOLHDL 2.8 03/19/2012 1015   VLDL 24 03/19/2012 1015   LDLCALC 107* 03/19/2012 1015    9. DVT ppx: Lovenox SQ  10. Dispo: Initially pending trach teaching to pt's daughter, but she has not been to the hospital for this. Since the daughter/family has not been available for teaching and the pt is unwilling to learn teaching, looking into SNF placement. Pt has been accepted by [facility] for today.     LOS: 9 days   Genelle Gather 05/29/2012, 12:23 PM

## 2012-05-29 NOTE — Progress Notes (Signed)
Physical Therapy Treatment Patient Details Name: [name] MRN: [id number] DOB: May 30, 1966 Today's Date: 05/29/2012 Time: 1040-1059 PT Time Calculation (min): 19 min  PT Assessment / Plan / Recommendation Comments on Treatment Session  Patient progressing well with ambulation today. Spoke with RN who stated patients daughter had not come up for education. At this time patient is not safe to return home without adequate assistance for mobility and care. Will update to SNF. Per CSW notes from yesterday patient appears agreeable to this plan    Follow Up Recommendations  SNF     Does the patient have the potential to tolerate intense rehabilitation     Barriers to Discharge        Equipment Recommendations  None recommended by PT    Recommendations for Other Services    Frequency Min 3X/week   Plan Discharge plan needs to be updated;Frequency needs to be updated    Precautions / Restrictions Precautions Precautions: Fall;Other (comment) Precaution Comments: trach   Pertinent Vitals/Pain     Mobility  Bed Mobility Supine to Sit: 6: Modified independent (Device/Increase time) Sitting - Scoot to Edge of Bed: 6: Modified independent (Device/Increase time) Sit to Supine: 6: Modified independent (Device/Increase time) Transfers Sit to Stand: 5: Supervision;With upper extremity assist;From bed Stand to Sit: 5: Supervision;With upper extremity assist;To bed Details for Transfer Assistance: S for safety Ambulation/Gait Ambulation/Gait Assistance: 4: Min assist Ambulation Distance (Feet): 200 Feet Assistive device: 1 person hand held assist Ambulation/Gait Assistance Details: A for stability. Patient with small steps and initally guarded with gait but was able to increase cadence as ambulation increased.  Gait Pattern: Step-through pattern;Decreased stride length;Shuffle;Narrow base of support    Exercises     PT Diagnosis:    PT Problem List:   PT Treatment Interventions:      PT Goals Acute Rehab PT Goals PT Goal: Sit to Stand - Progress: Progressing toward goal PT Goal: Stand to Sit - Progress: Progressing toward goal PT Goal: Ambulate - Progress: Progressing toward goal  Visit Information  Last PT Received On: 05/29/12 Assistance Needed: +1    Subjective Data      Cognition  Overall Cognitive Status: Appears within functional limits for tasks assessed/performed Arousal/Alertness: Awake/alert Orientation Level: Appears intact for tasks assessed Behavior During Session: [facility] for tasks performed    Balance     End of Session PT - End of Session Equipment Utilized During Treatment: Gait belt Activity Tolerance: Patient tolerated treatment well Patient left: with call bell/phone within reach;in chair Nurse Communication: Mobility status   GP     Fredrich Birks 05/29/2012, 12:11 PM  05/29/2012 Fredrich Birks PTA [phone] pager [phone] office

## 2012-05-29 NOTE — Progress Notes (Signed)
No sxn indicated, pt refuses sxn but has a good strong cough through trach

## 2012-05-30 LAB — GLUCOSE, CAPILLARY: Glucose-Capillary: 248 mg/dL — ABNORMAL HIGH (ref 70–99)

## 2012-05-30 NOTE — Progress Notes (Signed)
Agree with updated d/c recommendation of SNF.  05/30/2012 Cephus Shelling, PT, DPT [phone]

## 2012-06-24 ENCOUNTER — Telehealth: Payer: Self-pay | Admitting: Dietician

## 2012-06-24 NOTE — Telephone Encounter (Signed)
Patient called from [facility] nursing home with questions and concerns about her insulin. She was encouraged to discuss insulin concerns with her physician at the nursing home.

## 2012-06-24 NOTE — Telephone Encounter (Signed)
Thank you :)

## 2012-08-07 ENCOUNTER — Encounter (HOSPITAL_COMMUNITY): Payer: Self-pay | Admitting: Emergency Medicine

## 2012-08-07 ENCOUNTER — Emergency Department (HOSPITAL_COMMUNITY)
Admission: EM | Admit: 2012-08-07 | Discharge: 2012-08-07 | Disposition: A | Discharge status: Home / Self Care | Payer: Medicaid Other | Attending: Emergency Medicine | Admitting: Emergency Medicine

## 2012-08-07 DIAGNOSIS — I2510 Atherosclerotic heart disease of native coronary artery without angina pectoris: Secondary | ICD-10-CM | POA: Insufficient documentation

## 2012-08-07 DIAGNOSIS — Z8639 Personal history of other endocrine, nutritional and metabolic disease: Secondary | ICD-10-CM | POA: Insufficient documentation

## 2012-08-07 DIAGNOSIS — R5381 Other malaise: Secondary | ICD-10-CM | POA: Insufficient documentation

## 2012-08-07 DIAGNOSIS — I10 Essential (primary) hypertension: Secondary | ICD-10-CM | POA: Insufficient documentation

## 2012-08-07 DIAGNOSIS — Z87891 Personal history of nicotine dependence: Secondary | ICD-10-CM | POA: Insufficient documentation

## 2012-08-07 DIAGNOSIS — E162 Hypoglycemia, unspecified: Secondary | ICD-10-CM

## 2012-08-07 DIAGNOSIS — Z8709 Personal history of other diseases of the respiratory system: Secondary | ICD-10-CM | POA: Insufficient documentation

## 2012-08-07 DIAGNOSIS — E1169 Type 2 diabetes mellitus with other specified complication: Secondary | ICD-10-CM | POA: Insufficient documentation

## 2012-08-07 DIAGNOSIS — Z79899 Other long term (current) drug therapy: Secondary | ICD-10-CM | POA: Insufficient documentation

## 2012-08-07 DIAGNOSIS — E785 Hyperlipidemia, unspecified: Secondary | ICD-10-CM | POA: Insufficient documentation

## 2012-08-07 DIAGNOSIS — Z862 Personal history of diseases of the blood and blood-forming organs and certain disorders involving the immune mechanism: Secondary | ICD-10-CM | POA: Insufficient documentation

## 2012-08-07 DIAGNOSIS — Z794 Long term (current) use of insulin: Secondary | ICD-10-CM | POA: Insufficient documentation

## 2012-08-07 LAB — POCT I-STAT, CHEM 8
BUN: 8 mg/dL (ref 6–23)
Calcium, Ion: 1.21 mmol/L (ref 1.12–1.23)
Chloride: 104 mEq/L (ref 96–112)
Glucose, Bld: 109 mg/dL — ABNORMAL HIGH (ref 70–99)
HCT: 42 % (ref 36.0–46.0)
Potassium: 3.7 mEq/L (ref 3.5–5.1)

## 2012-08-07 LAB — URINE MICROSCOPIC-ADD ON

## 2012-08-07 LAB — URINALYSIS, ROUTINE W REFLEX MICROSCOPIC
Bilirubin Urine: NEGATIVE
Glucose, UA: 250 mg/dL — AB
Ketones, ur: NEGATIVE mg/dL
Nitrite: NEGATIVE
Specific Gravity, Urine: 1.017 (ref 1.005–1.030)
pH: 7.5 (ref 5.0–8.0)

## 2012-08-07 MED ORDER — DEXTROSE 50 % IV SOLN
1.0000 | Freq: Once | INTRAVENOUS | Status: DC
  Filled 2012-08-07: qty 50

## 2012-08-07 MED ORDER — ALBUTEROL SULFATE (5 MG/ML) 0.5% IN NEBU
5.0000 mg | INHALATION_SOLUTION | Freq: Once | RESPIRATORY_TRACT | Status: AC
  Administered 2012-08-07: 5 mg via RESPIRATORY_TRACT
  Filled 2012-08-07: qty 1
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
## 2012-08-07 NOTE — ED Notes (Signed)
Unable to obtain IV access, paged IV team.  

## 2012-08-07 NOTE — ED Notes (Signed)
While in room, pt started complaining of SOB sats remained at 100% respirations increased to 38 due to continuous coughing

## 2012-08-07 NOTE — ED Notes (Signed)
[place], CBG of 23, given 1mg  of glucagon, repeat CBG of 46.  Patient is confused.  Patient is hypertensive 170/100.

## 2012-08-07 NOTE — ED Notes (Signed)
Respiratory at bedside to suction patient

## 2012-08-09 ENCOUNTER — Emergency Department (HOSPITAL_COMMUNITY)
Admission: EM | Admit: 2012-08-09 | Discharge: 2012-08-09 | Disposition: A | Discharge status: Home / Self Care | Payer: Medicaid Other | Attending: Emergency Medicine | Admitting: Emergency Medicine

## 2012-08-09 ENCOUNTER — Encounter (HOSPITAL_COMMUNITY): Payer: Self-pay | Admitting: *Deleted

## 2012-08-09 ENCOUNTER — Emergency Department (HOSPITAL_COMMUNITY): Payer: Medicaid Other

## 2012-08-09 DIAGNOSIS — D509 Iron deficiency anemia, unspecified: Secondary | ICD-10-CM | POA: Insufficient documentation

## 2012-08-09 DIAGNOSIS — R0601 Orthopnea: Secondary | ICD-10-CM | POA: Insufficient documentation

## 2012-08-09 DIAGNOSIS — Z8659 Personal history of other mental and behavioral disorders: Secondary | ICD-10-CM | POA: Insufficient documentation

## 2012-08-09 DIAGNOSIS — I2510 Atherosclerotic heart disease of native coronary artery without angina pectoris: Secondary | ICD-10-CM | POA: Insufficient documentation

## 2012-08-09 DIAGNOSIS — I10 Essential (primary) hypertension: Secondary | ICD-10-CM | POA: Insufficient documentation

## 2012-08-09 DIAGNOSIS — Z430 Encounter for attention to tracheostomy: Secondary | ICD-10-CM

## 2012-08-09 DIAGNOSIS — R062 Wheezing: Secondary | ICD-10-CM | POA: Insufficient documentation

## 2012-08-09 DIAGNOSIS — E1069 Type 1 diabetes mellitus with other specified complication: Secondary | ICD-10-CM | POA: Insufficient documentation

## 2012-08-09 DIAGNOSIS — R0602 Shortness of breath: Secondary | ICD-10-CM | POA: Insufficient documentation

## 2012-08-09 DIAGNOSIS — R05 Cough: Secondary | ICD-10-CM | POA: Insufficient documentation

## 2012-08-09 DIAGNOSIS — Z79899 Other long term (current) drug therapy: Secondary | ICD-10-CM | POA: Insufficient documentation

## 2012-08-09 DIAGNOSIS — Z8611 Personal history of tuberculosis: Secondary | ICD-10-CM | POA: Insufficient documentation

## 2012-08-09 DIAGNOSIS — R059 Cough, unspecified: Secondary | ICD-10-CM | POA: Insufficient documentation

## 2012-08-09 DIAGNOSIS — E785 Hyperlipidemia, unspecified: Secondary | ICD-10-CM | POA: Insufficient documentation

## 2012-08-09 DIAGNOSIS — Z930 Tracheostomy status: Secondary | ICD-10-CM | POA: Insufficient documentation

## 2012-08-09 DIAGNOSIS — Z8639 Personal history of other endocrine, nutritional and metabolic disease: Secondary | ICD-10-CM | POA: Insufficient documentation

## 2012-08-09 DIAGNOSIS — Z862 Personal history of diseases of the blood and blood-forming organs and certain disorders involving the immune mechanism: Secondary | ICD-10-CM | POA: Insufficient documentation

## 2012-08-09 DIAGNOSIS — R739 Hyperglycemia, unspecified: Secondary | ICD-10-CM

## 2012-08-09 DIAGNOSIS — Z87891 Personal history of nicotine dependence: Secondary | ICD-10-CM | POA: Insufficient documentation

## 2012-08-09 DIAGNOSIS — Z794 Long term (current) use of insulin: Secondary | ICD-10-CM | POA: Insufficient documentation

## 2012-08-09 DIAGNOSIS — F79 Unspecified intellectual disabilities: Secondary | ICD-10-CM | POA: Insufficient documentation

## 2012-08-09 DIAGNOSIS — R061 Stridor: Secondary | ICD-10-CM | POA: Insufficient documentation

## 2012-08-09 DIAGNOSIS — Z8619 Personal history of other infectious and parasitic diseases: Secondary | ICD-10-CM | POA: Insufficient documentation

## 2012-08-09 LAB — RAPID URINE DRUG SCREEN, HOSP PERFORMED
Amphetamines: NOT DETECTED
Benzodiazepines: NOT DETECTED
Cocaine: NOT DETECTED
Opiates: NOT DETECTED
Tetrahydrocannabinol: NOT DETECTED

## 2012-08-09 LAB — BASIC METABOLIC PANEL
CO2: 27 mEq/L (ref 19–32)
Calcium: 9.2 mg/dL (ref 8.4–10.5)
GFR calc non Af Amer: >90 mL/min (ref >90)
Glucose, Bld: 307 mg/dL — ABNORMAL HIGH (ref 70–99)
Potassium: 4.2 mEq/L (ref 3.5–5.1)
Sodium: 136 mEq/L (ref 135–145)

## 2012-08-09 LAB — CBC WITH DIFFERENTIAL/PLATELET
Basophils Absolute: 0 10*3/uL (ref 0.0–0.1)
Eosinophils Absolute: 0 10*3/uL (ref 0.0–0.7)
Lymphs Abs: 1.5 10*3/uL (ref 0.7–4.0)
MCH: 20.3 pg — ABNORMAL LOW (ref 26.0–34.0)
MCHC: 31.8 g/dL (ref 30.0–36.0)
MCV: 63.7 fL — ABNORMAL LOW (ref 78.0–100.0)
Monocytes Absolute: 0.5 10*3/uL (ref 0.1–1.0)
Neutrophils Relative %: 57 % (ref 43–77)
Platelets: 214 10*3/uL (ref 150–400)
RBC: 5.53 MIL/uL — ABNORMAL HIGH (ref 3.87–5.11)
RDW: 17.2 % — ABNORMAL HIGH (ref 11.5–15.5)

## 2012-08-09 LAB — GLUCOSE, CAPILLARY: Glucose-Capillary: 266 mg/dL — ABNORMAL HIGH (ref 70–99)

## 2012-08-09 MED ORDER — ALBUTEROL SULFATE (5 MG/ML) 0.5% IN NEBU
5.0000 mg | INHALATION_SOLUTION | Freq: Once | RESPIRATORY_TRACT | Status: AC
  Administered 2012-08-09: 5 mg via RESPIRATORY_TRACT
  Filled 2012-08-09: qty 1

## 2012-08-09 MED ORDER — ALBUTEROL SULFATE (2.5 MG/3ML) 0.083% IN NEBU: 2.5000 mg | INHALATION_SOLUTION | Freq: Four times a day (QID) | RESPIRATORY_TRACT | Status: DC | PRN
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
## 2012-08-09 NOTE — ED Provider Notes (Signed)
Medical screening examination/treatment/procedure(s) were performed by non-physician practitioner and as supervising physician I was immediately available for consultation/collaboration.   Laray Anger, DO 08/09/12 1626

## 2012-08-09 NOTE — ED Notes (Signed)
Per EMS pt from home with c/o shortness of breath since 1045 am. Pt has trach x 3 months. Tachy 120's. BP 167/107 R 20's. Labored. Pt given albuterol 5 mg nebulized through trach. Alert and oriented x 3.

## 2012-08-09 NOTE — ED Provider Notes (Signed)
Medical screening examination/treatment/procedure(s) were performed by non-physician practitioner and as supervising physician I was immediately available for consultation/collaboration.    Vida Roller, MD 08/09/12 [phone]

## 2012-08-15 ENCOUNTER — Emergency Department (HOSPITAL_COMMUNITY)
Admission: EM | Admit: 2012-08-15 | Discharge: 2012-08-15 | Disposition: A | Discharge status: Home / Self Care | Payer: Medicaid Other | Attending: Emergency Medicine | Admitting: Emergency Medicine

## 2012-08-15 ENCOUNTER — Encounter (HOSPITAL_COMMUNITY): Payer: Self-pay | Admitting: Emergency Medicine

## 2012-08-15 DIAGNOSIS — Z87891 Personal history of nicotine dependence: Secondary | ICD-10-CM | POA: Insufficient documentation

## 2012-08-15 DIAGNOSIS — Z8659 Personal history of other mental and behavioral disorders: Secondary | ICD-10-CM | POA: Insufficient documentation

## 2012-08-15 DIAGNOSIS — F489 Nonpsychotic mental disorder, unspecified: Secondary | ICD-10-CM | POA: Insufficient documentation

## 2012-08-15 DIAGNOSIS — F1410 Cocaine abuse, uncomplicated: Secondary | ICD-10-CM | POA: Insufficient documentation

## 2012-08-15 DIAGNOSIS — Z8611 Personal history of tuberculosis: Secondary | ICD-10-CM | POA: Insufficient documentation

## 2012-08-15 DIAGNOSIS — E109 Type 1 diabetes mellitus without complications: Secondary | ICD-10-CM | POA: Insufficient documentation

## 2012-08-15 DIAGNOSIS — D509 Iron deficiency anemia, unspecified: Secondary | ICD-10-CM | POA: Insufficient documentation

## 2012-08-15 DIAGNOSIS — Z8679 Personal history of other diseases of the circulatory system: Secondary | ICD-10-CM | POA: Insufficient documentation

## 2012-08-15 DIAGNOSIS — Z8619 Personal history of other infectious and parasitic diseases: Secondary | ICD-10-CM | POA: Insufficient documentation

## 2012-08-15 DIAGNOSIS — F1010 Alcohol abuse, uncomplicated: Secondary | ICD-10-CM | POA: Insufficient documentation

## 2012-08-15 DIAGNOSIS — J9509 Other tracheostomy complication: Secondary | ICD-10-CM

## 2012-08-15 DIAGNOSIS — Z79899 Other long term (current) drug therapy: Secondary | ICD-10-CM | POA: Insufficient documentation

## 2012-08-15 DIAGNOSIS — Z8639 Personal history of other endocrine, nutritional and metabolic disease: Secondary | ICD-10-CM | POA: Insufficient documentation

## 2012-08-15 DIAGNOSIS — Z794 Long term (current) use of insulin: Secondary | ICD-10-CM | POA: Insufficient documentation

## 2012-08-15 DIAGNOSIS — Z862 Personal history of diseases of the blood and blood-forming organs and certain disorders involving the immune mechanism: Secondary | ICD-10-CM | POA: Insufficient documentation

## 2012-08-15 DIAGNOSIS — Z930 Tracheostomy status: Secondary | ICD-10-CM | POA: Insufficient documentation

## 2012-08-15 DIAGNOSIS — E785 Hyperlipidemia, unspecified: Secondary | ICD-10-CM | POA: Insufficient documentation

## 2012-08-15 LAB — GLUCOSE, CAPILLARY: Glucose-Capillary: 145 mg/dL — ABNORMAL HIGH (ref 70–99)

## 2012-08-15 MED ORDER — ALBUTEROL SULFATE HFA 108 (90 BASE) MCG/ACT IN AERS: 2.0000 | INHALATION_SPRAY | RESPIRATORY_TRACT | Status: DC | PRN

## 2012-08-15 NOTE — Progress Notes (Signed)
08/15/12 0037  Therapy Vitals  Pulse Rate 105   Resp 20   BP 124/95 mmHg  Oxygen Therapy/Pulse Ox  O2 Device Trach collar  O2 Therapy Oxygen humidified  FiO2 (%) 28 %  SpO2 100 %  Trach care performed. Inner cannula found to be occluded with foreign debris. Cleaned inner cannula and re-inserted in trach. Pt is stable with no signs of distress.

## 2012-08-15 NOTE — ED Notes (Signed)
PT. ARRIVED WITH EMS FROM HOME ,  WHEEZING AT ARRIVAL , O2 SAT 99% - TRACHEOSTOMY EVALUATED  BY RESPIRATORY THERAPIST AT ARRIVAL . NO COUGH .

## 2012-08-15 NOTE — ED Notes (Signed)
PT. REFUSED IV INSERTION BY NURSE AT ARRIVAL.Marland Kitchen
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
## 2012-08-19 ENCOUNTER — Telehealth: Payer: Self-pay | Admitting: Dietician

## 2012-08-19 ENCOUNTER — Encounter: Payer: Self-pay | Admitting: Internal Medicine

## 2012-08-19 DIAGNOSIS — Z930 Tracheostomy status: Secondary | ICD-10-CM

## 2012-08-19 HISTORY — DX: Tracheostomy status: Z93.0

## 2012-08-19 NOTE — Telephone Encounter (Signed)
Patient called requesting glucose tablets for hypoglycemia when she comes for her appointment in the morning. If friend is willing to learn how to administer, may want to consider glucagon rx. Appointment with [name] confirmed for 9:15 am.

## 2012-08-20 ENCOUNTER — Ambulatory Visit (INDEPENDENT_AMBULATORY_CARE_PROVIDER_SITE_OTHER): Payer: Medicaid Other | Admitting: Internal Medicine

## 2012-08-20 ENCOUNTER — Encounter (HOSPITAL_COMMUNITY): Payer: Self-pay

## 2012-08-20 ENCOUNTER — Inpatient Hospital Stay (HOSPITAL_COMMUNITY): Admission: AD | Admit: 2012-08-20 | Payer: Medicaid Other | Source: Ambulatory Visit | Admitting: Infectious Disease

## 2012-08-20 ENCOUNTER — Inpatient Hospital Stay (HOSPITAL_COMMUNITY)
Admission: EM | Admit: 2012-08-20 | Discharge: 2012-08-23 | DRG: 372 | Disposition: A | Discharge status: Home Health | Payer: Medicaid Other | Attending: Infectious Disease | Admitting: Infectious Disease

## 2012-08-20 ENCOUNTER — Other Ambulatory Visit: Payer: Self-pay

## 2012-08-20 VITALS — BP 127/82 | HR 120 | Temp 98.5°F

## 2012-08-20 DIAGNOSIS — Z87891 Personal history of nicotine dependence: Secondary | ICD-10-CM

## 2012-08-20 DIAGNOSIS — R6510 Systemic inflammatory response syndrome (SIRS) of non-infectious origin without acute organ dysfunction: Secondary | ICD-10-CM

## 2012-08-20 DIAGNOSIS — I2510 Atherosclerotic heart disease of native coronary artery without angina pectoris: Secondary | ICD-10-CM | POA: Diagnosis present

## 2012-08-20 DIAGNOSIS — Z9089 Acquired absence of other organs: Secondary | ICD-10-CM

## 2012-08-20 DIAGNOSIS — E785 Hyperlipidemia, unspecified: Secondary | ICD-10-CM | POA: Diagnosis present

## 2012-08-20 DIAGNOSIS — A0472 Enterocolitis due to Clostridium difficile, not specified as recurrent: Principal | ICD-10-CM | POA: Diagnosis present

## 2012-08-20 DIAGNOSIS — R197 Diarrhea, unspecified: Secondary | ICD-10-CM

## 2012-08-20 DIAGNOSIS — Z8639 Personal history of other endocrine, nutritional and metabolic disease: Secondary | ICD-10-CM

## 2012-08-20 DIAGNOSIS — R7611 Nonspecific reaction to tuberculin skin test without active tuberculosis: Secondary | ICD-10-CM | POA: Diagnosis present

## 2012-08-20 DIAGNOSIS — E1065 Type 1 diabetes mellitus with hyperglycemia: Secondary | ICD-10-CM

## 2012-08-20 DIAGNOSIS — R404 Transient alteration of awareness: Secondary | ICD-10-CM

## 2012-08-20 DIAGNOSIS — K209 Esophagitis, unspecified without bleeding: Secondary | ICD-10-CM | POA: Diagnosis present

## 2012-08-20 DIAGNOSIS — E039 Hypothyroidism, unspecified: Secondary | ICD-10-CM | POA: Diagnosis present

## 2012-08-20 DIAGNOSIS — F1010 Alcohol abuse, uncomplicated: Secondary | ICD-10-CM | POA: Diagnosis present

## 2012-08-20 DIAGNOSIS — Z930 Tracheostomy status: Secondary | ICD-10-CM

## 2012-08-20 DIAGNOSIS — IMO0002 Reserved for concepts with insufficient information to code with codable children: Secondary | ICD-10-CM | POA: Diagnosis present

## 2012-08-20 DIAGNOSIS — K529 Noninfective gastroenteritis and colitis, unspecified: Secondary | ICD-10-CM

## 2012-08-20 DIAGNOSIS — Z794 Long term (current) use of insulin: Secondary | ICD-10-CM

## 2012-08-20 DIAGNOSIS — F1411 Cocaine abuse, in remission: Secondary | ICD-10-CM | POA: Diagnosis present

## 2012-08-20 DIAGNOSIS — R1110 Vomiting, unspecified: Secondary | ICD-10-CM

## 2012-08-20 DIAGNOSIS — R400 Somnolence: Secondary | ICD-10-CM

## 2012-08-20 DIAGNOSIS — F489 Nonpsychotic mental disorder, unspecified: Secondary | ICD-10-CM | POA: Diagnosis present

## 2012-08-20 LAB — GLUCOSE, CAPILLARY
Glucose-Capillary: 335 mg/dL — ABNORMAL HIGH (ref 70–99)
Glucose-Capillary: 239 mg/dL — ABNORMAL HIGH (ref 70–99)

## 2012-08-20 LAB — URINE MICROSCOPIC-ADD ON

## 2012-08-20 LAB — CBC WITH DIFFERENTIAL/PLATELET
Band Neutrophils: 0 % (ref 0–10)
Basophils Absolute: 0 10*3/uL (ref 0.0–0.1)
Basophils Relative: 0 % (ref 0–1)
Blasts: 0 %
HCT: 41 % (ref 36.0–46.0)
Hemoglobin: 13.3 g/dL (ref 12.0–15.0)
Lymphocytes Relative: 8 % — ABNORMAL LOW (ref 12–46)
Lymphs Abs: 0.5 10*3/uL — ABNORMAL LOW (ref 0.7–4.0)
Monocytes Absolute: 0.4 10*3/uL (ref 0.1–1.0)
Monocytes Relative: 6 % (ref 3–12)
RBC: 6.43 MIL/uL — ABNORMAL HIGH (ref 3.87–5.11)
WBC: 6.6 10*3/uL (ref 4.0–10.5)

## 2012-08-20 LAB — COMPREHENSIVE METABOLIC PANEL
AST: 17 U/L (ref 0–37)
CO2: 22 mEq/L (ref 19–32)
Calcium: 9.1 mg/dL (ref 8.4–10.5)
Creatinine, Ser: 0.68 mg/dL (ref 0.50–1.10)
GFR calc non Af Amer: >90 mL/min (ref >90)

## 2012-08-20 LAB — URINALYSIS, ROUTINE W REFLEX MICROSCOPIC
Hgb urine dipstick: NEGATIVE
Leukocytes, UA: NEGATIVE
Nitrite: NEGATIVE
Specific Gravity, Urine: 1.026 (ref 1.005–1.030)
Urobilinogen, UA: 0.2 mg/dL (ref 0.0–1.0)

## 2012-08-20 MED ORDER — SODIUM CHLORIDE 0.9 % IJ SOLN
3.0000 mL | Freq: Two times a day (BID) | INTRAMUSCULAR | Status: DC
  Administered 2012-08-20 – 2012-08-22 (×3): 3 mL via INTRAVENOUS

## 2012-08-20 MED ORDER — ENSURE COMPLETE PO LIQD
237.0000 mL | Freq: Three times a day (TID) | ORAL | Status: DC
  Administered 2012-08-20 – 2012-08-21 (×5): 237 mL via ORAL

## 2012-08-20 MED ORDER — INSULIN GLARGINE 100 UNIT/ML ~~LOC~~ SOLN
6.0000 [IU] | Freq: Every day | SUBCUTANEOUS | Status: DC
Start: 2012-08-20 — End: 2012-08-23
  Administered 2012-08-20 – 2012-08-22 (×3): 6 [IU] via SUBCUTANEOUS

## 2012-08-20 MED ORDER — ALBUTEROL SULFATE HFA 108 (90 BASE) MCG/ACT IN AERS
2.0000 | INHALATION_SPRAY | RESPIRATORY_TRACT | Status: DC | PRN
Start: 2012-08-20 — End: 2012-08-23
  Filled 2012-08-20: qty 6.7

## 2012-08-20 MED ORDER — ALBUTEROL SULFATE (5 MG/ML) 0.5% IN NEBU: 2.5000 mg | INHALATION_SOLUTION | RESPIRATORY_TRACT | Status: DC | PRN

## 2012-08-20 MED ORDER — PANTOPRAZOLE SODIUM 20 MG PO TBEC
20.0000 mg | DELAYED_RELEASE_TABLET | Freq: Every day | ORAL | Status: DC
  Administered 2012-08-21: 20 mg via ORAL
  Filled 2012-08-20: qty 1

## 2012-08-20 MED ORDER — ENOXAPARIN SODIUM 40 MG/0.4ML ~~LOC~~ SOLN
40.0000 mg | SUBCUTANEOUS | Status: DC
  Administered 2012-08-20 – 2012-08-22 (×3): 40 mg via SUBCUTANEOUS
  Filled 2012-08-20 (×4): qty 0.4

## 2012-08-20 MED ORDER — INSULIN ASPART 100 UNIT/ML ~~LOC~~ SOLN
0.0000 [IU] | Freq: Three times a day (TID) | SUBCUTANEOUS | Status: DC
  Administered 2012-08-20: 5 [IU] via SUBCUTANEOUS
  Administered 2012-08-21: 2 [IU] via SUBCUTANEOUS
  Administered 2012-08-21: 9 [IU] via SUBCUTANEOUS
  Administered 2012-08-21: 1 [IU] via SUBCUTANEOUS
  Administered 2012-08-22 (×3): 2 [IU] via SUBCUTANEOUS
  Administered 2012-08-23: 5 [IU] via SUBCUTANEOUS

## 2012-08-20 MED ORDER — INSULIN ASPART 100 UNIT/ML ~~LOC~~ SOLN
0.0000 [IU] | Freq: Every day | SUBCUTANEOUS | Status: DC
  Administered 2012-08-20: 2 [IU] via SUBCUTANEOUS
  Administered 2012-08-21: 3 [IU] via SUBCUTANEOUS

## 2012-08-20 MED ORDER — ONDANSETRON HCL 4 MG PO TABS: 4.0000 mg | ORAL_TABLET | Freq: Four times a day (QID) | ORAL | Status: DC | PRN

## 2012-08-20 MED ORDER — ONDANSETRON HCL 4 MG/2ML IJ SOLN
4.0000 mg | Freq: Four times a day (QID) | INTRAMUSCULAR | Status: DC | PRN
  Filled 2012-08-20: qty 2

## 2012-08-20 MED ORDER — VITAMIN B-12 100 MCG PO TABS
100.0000 ug | ORAL_TABLET | Freq: Every day | ORAL | Status: DC
  Administered 2012-08-21 – 2012-08-22 (×2): 100 ug via ORAL
  Filled 2012-08-20 (×4): qty 1

## 2012-08-20 MED ORDER — SODIUM CHLORIDE 0.9 % IV SOLN
INTRAVENOUS | Status: DC
  Administered 2012-08-20 (×2): via INTRAVENOUS
  Administered 2012-08-20: 150 mL/h via INTRAVENOUS
  Administered 2012-08-21: 07:00:00 via INTRAVENOUS

## 2012-08-20 MED ORDER — ACETAMINOPHEN 650 MG RE SUPP: 650.0000 mg | Freq: Four times a day (QID) | RECTAL | Status: DC | PRN

## 2012-08-20 MED ORDER — DIVALPROEX SODIUM 125 MG PO DR TAB
125.0000 mg | DELAYED_RELEASE_TABLET | Freq: Every day | ORAL | Status: DC
  Administered 2012-08-21 – 2012-08-23 (×3): 125 mg via ORAL
  Filled 2012-08-20 (×4): qty 1

## 2012-08-20 MED ORDER — SIMVASTATIN 20 MG PO TABS
20.0000 mg | ORAL_TABLET | Freq: Every day | ORAL | Status: DC
  Administered 2012-08-20 – 2012-08-22 (×3): 20 mg via ORAL
  Filled 2012-08-20 (×4): qty 1

## 2012-08-20 MED ORDER — ACETAMINOPHEN 325 MG PO TABS
650.0000 mg | ORAL_TABLET | Freq: Four times a day (QID) | ORAL | Status: DC | PRN
  Administered 2012-08-20 – 2012-08-21 (×2): 650 mg via ORAL
  Filled 2012-08-20 (×2): qty 2

## 2012-08-20 MED ORDER — FERROUS SULFATE 325 (65 FE) MG PO TABS
325.0000 mg | ORAL_TABLET | Freq: Every day | ORAL | Status: DC
  Administered 2012-08-21 – 2012-08-23 (×3): 325 mg via ORAL
  Filled 2012-08-20 (×4): qty 1

## 2012-08-20 NOTE — ED Provider Notes (Signed)
I was available for supervision, but this patient was sent here for direct admission  Gerhard Munch, MD 08/20/12 1646

## 2012-08-20 NOTE — ED Notes (Signed)
Pt lives with her mom, came into the clinic today for abdominal pain and vomiting and diarrhea for 3 days.

## 2012-08-20 NOTE — ED Provider Notes (Signed)
9:58 AM Patient in ED, h/o trach, DM -- sent from [facility] for direct admit. C/o 2 days N/V/D and abdominal pain. She appears dry. Admission orders placed.   Exam:  Gen NAD; Neck trach appears patent; Heart tachycardia, nml S1,S2, no m/r/g; Lungs CTAB; Abd diffusely tender, no rebound or guarding; Ext no edema.    Renne Crigler, [place] 08/20/12 1000

## 2012-08-20 NOTE — ED Notes (Signed)
Phlebotomy at the bedside to collect blood from IV start

## 2012-08-20 NOTE — Progress Notes (Signed)
  Subjective:    Patient ID: [name], female    DOB: 03-17-1966, 47 y.o.   MRN: [id number]  HPI  2 days h/o ABD pain, N/V/D. Pt states able to eat well yesterday but up Q hour last PM with D. Also V 4-5 times yesterday. Also c/o sore throat and some intermittent dyspnea with increased productive cough. Denies fever. States feels bad - 9 / 10. Due to HR 120, sig decreased BS, increased RR, and somnolence (freq nodded off during my exam) she needs admitted. At baseline, she has trouble caring for herself. Tele bed. Contacted Dr Allena Katz. Pt needs immediate care so will transport to ED.   Review of Systems     Objective:   Physical Exam        Assessment & Plan:
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
## 2012-08-21 DIAGNOSIS — A0472 Enterocolitis due to Clostridium difficile, not specified as recurrent: Principal | ICD-10-CM

## 2012-08-21 DIAGNOSIS — Z930 Tracheostomy status: Secondary | ICD-10-CM

## 2012-08-21 LAB — CLOSTRIDIUM DIFFICILE BY PCR: Toxigenic C. Difficile by PCR: POSITIVE — AB

## 2012-08-21 LAB — HEMOGLOBIN A1C
Hgb A1c MFr Bld: 10.2 % — ABNORMAL HIGH (ref <5.7)
Mean Plasma Glucose: 246 mg/dL — ABNORMAL HIGH (ref <117)

## 2012-08-21 LAB — HEPATITIS PANEL, ACUTE
HCV Ab: NEGATIVE
Hep A IgM: NEGATIVE
Hep B C IgM: NEGATIVE

## 2012-08-21 LAB — LIPASE, BLOOD: Lipase: 10 U/L — ABNORMAL LOW (ref 11–59)

## 2012-08-21 LAB — GLUCOSE, CAPILLARY
Glucose-Capillary: 129 mg/dL — ABNORMAL HIGH (ref 70–99)
Glucose-Capillary: 259 mg/dL — ABNORMAL HIGH (ref 70–99)

## 2012-08-21 MED ORDER — METRONIDAZOLE 500 MG PO TABS
500.0000 mg | ORAL_TABLET | Freq: Three times a day (TID) | ORAL | Status: DC
Start: 2012-08-21 — End: 2012-08-23
  Administered 2012-08-21 – 2012-08-23 (×6): 500 mg via ORAL
  Filled 2012-08-21 (×10): qty 1

## 2012-08-21 MED ORDER — TRAMADOL HCL 50 MG PO TABS
50.0000 mg | ORAL_TABLET | Freq: Four times a day (QID) | ORAL | Status: DC | PRN
  Administered 2012-08-21: 50 mg via ORAL
  Filled 2012-08-21: qty 1

## 2012-08-21 MED ORDER — ALUM & MAG HYDROXIDE-SIMETH 200-200-20 MG/5ML PO SUSP
15.0000 mL | Freq: Once | ORAL | Status: AC
  Administered 2012-08-21: 15 mL via ORAL
  Filled 2012-08-21: qty 30

## 2012-08-21 NOTE — H&P (Signed)
Internal Medicine Teaching Service Attending Note Date: 08/21/2012  Patient name: [name]  Medical record number: [id number]  Date of birth: 1966/01/30    This patient has been seen and discussed with the house staff. Please see their note for complete details. I concur with their findings with the following additions/corrections:  Patient still with significant abdominal pain this am. Her C difficle pCR is positive and we will treat for first episode nonsevere  Clostridium difficile colitis with oral metronidazole 500mg  tid for two week course  Should DC all unnecessary antibiotics and PPI    Paulette Blanch Dam 08/21/2012, 2:14 PM

## 2012-08-21 NOTE — Progress Notes (Signed)
Pt had formed BM.  Unable to send due to mixed with urine in bedside commode per NT [facility].  Will continue to monitor. Thomas Hoff

## 2012-08-21 NOTE — Progress Notes (Signed)
Lab called with positive c.diff  Result.  Notified MD.  Will continue to monitor. Thomas Hoff

## 2012-08-21 NOTE — Progress Notes (Signed)
Visit to patient while in hospital. Will call after she is discharged to assist with transition of care. 

## 2012-08-21 NOTE — Progress Notes (Signed)
Subjective: [name] was seen and examined at bedside.  She had formed BMs this AM as per RN.  No reported vomiting per RN, although patient states she did vomit in the morning.  She also complains of slight abdominal pain that she has been having for several days and continues to want her trach suctioned although every respiratory has seen minimal secretions if any.  She denies any fever, chills, chest pain, or shortness of breath at this time.      Objective: Vital signs in last 24 hours: Filed Vitals:   08/20/12 2032 08/21/12 0033 08/21/12 0347 08/21/12 0447  BP: 111/53   123/64  Pulse:  91 93 74  Temp: 98.5 F (36.9 C)   97.9 F (36.6 C)  TempSrc: Oral   Oral  Resp:  18 18 16   Height:      Weight:      SpO2:  100% 100% 100%   Weight change:   Intake/Output Summary (Last 24 hours) at 08/21/12 0931 Last data filed at 08/21/12 0730  Gross per 24 hour  Intake   3288 ml  Output   1600 ml  Net   1688 ml   Vitals reviewed. Constitutional: Vital signs reviewed. Thin female, NAD.   Head: Normocephalic and atraumatic  Mouth: no erythema or exudates, dry mucous membranes  Eyes: PERRLA, EOMI, conjunctivae normal, No scleral icterus.  Neck: Supple, tracheostomy in place, no surrounding erythema or discharge.  Cardiovascular: RRR, S1 normal, S2 normal, no MRG  Pulmonary/Chest: CTAB, no wheezes, rales, or rhonchi  Abdominal: Soft. Tenderness to deep palpation peri-umbilical, non-distended, bowel sounds are normal, +voluntary guarding Musculoskeletal: No joint deformities, erythema, or stiffness, ROM full and no nontender Neurological: A&O x3, Strength is normal and symmetric bilaterally, cranial nerve II-XII are grossly intact, no focal motor deficit  Skin: Warm, dry and intact. No rash, cyanosis, or clubbing.  Psychiatric: Normal mood and affect. speech and behavior is normal.   Lab Results: Basic Metabolic Panel:  Lab 08/20/12 4098  NA 136  K 3.8  CL 99  CO2 22  GLUCOSE 304*   BUN 10  CREATININE 0.68  CALCIUM 9.1  MG --  PHOS --   Liver Function Tests:  Lab 08/20/12 0954  AST 17  ALT 6  ALKPHOS 77  BILITOT 0.3  PROT 9.1*  ALBUMIN 4.0   CBC:  Lab 08/20/12 0954  WBC 6.6  NEUTROABS 5.6  HGB 13.3  HCT 41.0  MCV 63.8*  PLT 198   CBG:  Lab 08/21/12 0552 08/20/12 2115 08/20/12 1627 08/20/12 1237 08/20/12 0841 08/15/12 0145  GLUCAP 372* 239* 267* 180* 335* 145*   Hemoglobin A1C:  Lab 08/20/12 0954  HGBA1C 10.2*   Urine Drug Screen: Drugs of Abuse     Component Value Date/Time   LABOPIA NONE DETECTED 08/09/2012 1314   COCAINSCRNUR NONE DETECTED 08/09/2012 1314   LABBENZ NONE DETECTED 08/09/2012 1314   AMPHETMU NONE DETECTED 08/09/2012 1314   THCU NONE DETECTED 08/09/2012 1314   LABBARB NONE DETECTED 08/09/2012 1314    Urinalysis:  Lab 08/20/12 2120  COLORURINE YELLOW  LABSPEC 1.026  PHURINE 5.0  GLUCOSEU >1000*  HGBUR NEGATIVE  BILIRUBINUR NEGATIVE  KETONESUR 40*  PROTEINUR NEGATIVE  UROBILINOGEN 0.2  NITRITE NEGATIVE  LEUKOCYTESUR NEGATIVE   Medications: I have reviewed the patient's current medications. Scheduled Meds:   . divalproex  125 mg Oral Daily  . enoxaparin (LOVENOX) injection  40 mg Subcutaneous Q24H  . feeding supplement  237 mL Oral  TID BM  . ferrous sulfate  325 mg Oral Q breakfast  . insulin aspart  0-5 Units Subcutaneous QHS  . insulin aspart  0-9 Units Subcutaneous TID WC  . insulin glargine  6 Units Subcutaneous QHS  . pantoprazole  20 mg Oral Daily  . simvastatin  20 mg Oral q1800  . sodium chloride  3 mL Intravenous Q12H  . vitamin B-12  100 mcg Oral Daily   Continuous Infusions:   . sodium chloride 150 mL/hr at 08/21/12 0630   PRN Meds:.acetaminophen, acetaminophen, albuterol, albuterol, ondansetron (ZOFRAN) IV, ondansetron Assessment/Plan: Principal Problem:  *Acute gastroenteritis  Active Problems:  Diabetes mellitus type 1, uncontrolled, insulin dependent  HYPERLIPIDEMIA  SUBSTANCE ABUSE,  MULTIPLE  Tracheostomy dependence   1) Acute gastroenteritis: likely viral. Improving.  Patient with acute onset nausea vomiting and diarrhea with abdominal pain as described in history of present illness following sick contact with family member.  U/A neg for nitrite or leukocytes.   Other differentials include acute pancreatitis (less likely) and other hepatobiliary pathology less likely.  - C. difficile PCR pending - IVF - Zofran PRN - If patient spikes high fever, will collect blood cultures and consider antibiotics as needed.  - No further stool studies as of now-consider as needed clinically, currently improving - lipase  2) Tracheostomy: Stable tracheostomy tube. No surrounding erythema or signs of infection.  - regular trach care.   3) DM 1: hemoglobin A1c - 10.2 08/20/12 - Continue Lantus at home dose and SSI - CBG monitoring  4) Polysubstance abuse: Recent UDS negative for cocaine and other substances 07/2012.   - denies any recent illicit drug use.    Diet: carb modified Dvt Ppx: lovenox Dispo: likely d/c home today  The patient does have a current PCP Blanch Media, MD), therefore will berequiring OPC follow-up after discharge.  The patient does not know have transportation limitations that hinder transportation to clinic appointments.  .Services Needed at time of discharge: Y = Yes, Blank = No PT:   OT:   RN:   Equipment:   Other:     LOS: 1 day   [name] 08/21/2012, 9:31 AM

## 2012-08-21 NOTE — Progress Notes (Deleted)
Pt is refusing to do CPT that she does not want it at this time. Pt did agree to do the Flutter valve and attempted 5, pt was asked to cough after had a non productive congested cough. RT will try CPT again at next scheduled time.

## 2012-08-21 NOTE — Progress Notes (Signed)
Per pt:  Pt lives at home. Per pt, she takes care of her trach independently, but does have people that come to help her as well as a home health RN q3 days (per pt report).  Trach site clean and dry.  Per pt, she does not need any trach education.    Will request from RN a spare 6.0 shiley cuffless trach to be at bedside .

## 2012-08-22 ENCOUNTER — Inpatient Hospital Stay (HOSPITAL_COMMUNITY): Payer: Medicaid Other

## 2012-08-22 LAB — CBC
Platelets: 150 10*3/uL (ref 150–400)
RBC: 4.99 MIL/uL (ref 3.87–5.11)
WBC: 3.8 10*3/uL — ABNORMAL LOW (ref 4.0–10.5)

## 2012-08-22 LAB — BASIC METABOLIC PANEL
CO2: 28 mEq/L (ref 19–32)
Calcium: 8.9 mg/dL (ref 8.4–10.5)
Chloride: 103 mEq/L (ref 96–112)
Sodium: 139 mEq/L (ref 135–145)

## 2012-08-22 LAB — MAGNESIUM: Magnesium: 1.8 mg/dL (ref 1.5–2.5)

## 2012-08-22 LAB — GLUCOSE, CAPILLARY
Glucose-Capillary: 172 mg/dL — ABNORMAL HIGH (ref 70–99)
Glucose-Capillary: 157 mg/dL — ABNORMAL HIGH (ref 70–99)

## 2012-08-22 MED ORDER — PROMETHAZINE HCL 25 MG/ML IJ SOLN: 12.5000 mg | Freq: Four times a day (QID) | INTRAMUSCULAR | Status: DC | PRN

## 2012-08-22 NOTE — Progress Notes (Signed)
Subjective: [name] was seen and examined at bedside.  She was vomiting after eating this morning.  She also continues to complain of slight abdominal pain that she has been having for several days.  She denies any fever, chills, chest pain, or shortness of breath at this time.      Objective: Vital signs in last 24 hours: Filed Vitals:   08/22/12 0409 08/22/12 0630 08/22/12 1007 08/22/12 1357  BP:  139/78  134/74  Pulse: 82 73 76 75  Temp:  97.9 F (36.6 C)  98 F (36.7 C)  TempSrc:  Oral  Oral  Resp: 18 18 20 18   Height:      Weight:      SpO2: 100% 100% 100% 100%   Weight change:   Intake/Output Summary (Last 24 hours) at 08/22/12 1437 Last data filed at 08/22/12 1300  Gross per 24 hour  Intake    480 ml  Output      0 ml  Net    480 ml   Vitals reviewed. Constitutional: Vital signs reviewed. Thin female  Head: Normocephalic and atraumatic  Mouth: no erythema or exudates, dry mucous membranes  Eyes: PERRLA, EOMI, conjunctivae normal, No scleral icterus.  Neck: Supple, tracheostomy in place, no surrounding erythema or discharge.  Cardiovascular: RRR, S1 normal, S2 normal, no MRG  Pulmonary/Chest: CTAB, no wheezes, rales, or rhonchi  Abdominal: Soft. Minimal tenderness to deep palpation peri-umbilical, non-distended, bowel sounds are normal Musculoskeletal: No joint deformities, erythema, or stiffness, ROM full and no nontender Neurological: A&O x3, Strength is normal and symmetric bilaterally, cranial nerve II-XII are grossly intact, no focal motor deficit  Skin: Warm, dry and intact. No rash, cyanosis, or clubbing.  Psychiatric: Normal mood and affect. speech and behavior is normal.   Lab Results: Basic Metabolic Panel:  Lab 08/20/12 1610  NA 136  K 3.8  CL 99  CO2 22  GLUCOSE 304*  BUN 10  CREATININE 0.68  CALCIUM 9.1  MG --  PHOS --   Liver Function Tests:  Lab 08/20/12 0954  AST 17  ALT 6  ALKPHOS 77  BILITOT 0.3  PROT 9.1*  ALBUMIN 4.0    CBC:  Lab 08/20/12 0954  WBC 6.6  NEUTROABS 5.6  HGB 13.3  HCT 41.0  MCV 63.8*  PLT 198   CBG:  Lab 08/22/12 1105 08/22/12 0629 08/21/12 2158 08/21/12 1624 08/21/12 1127 08/21/12 0552  GLUCAP 172* 195* 259* 161* 129* 372*   Hemoglobin A1C:  Lab 08/20/12 0954  HGBA1C 10.2*   Urine Drug Screen: Drugs of Abuse     Component Value Date/Time   LABOPIA NONE DETECTED 08/09/2012 1314   COCAINSCRNUR NONE DETECTED 08/09/2012 1314   LABBENZ NONE DETECTED 08/09/2012 1314   AMPHETMU NONE DETECTED 08/09/2012 1314   THCU NONE DETECTED 08/09/2012 1314   LABBARB NONE DETECTED 08/09/2012 1314    Urinalysis:  Lab 08/20/12 2120  COLORURINE YELLOW  LABSPEC 1.026  PHURINE 5.0  GLUCOSEU >1000*  HGBUR NEGATIVE  BILIRUBINUR NEGATIVE  KETONESUR 40*  PROTEINUR NEGATIVE  UROBILINOGEN 0.2  NITRITE NEGATIVE  LEUKOCYTESUR NEGATIVE   Medications: I have reviewed the patient's current medications. Scheduled Meds:    . divalproex  125 mg Oral Daily  . enoxaparin (LOVENOX) injection  40 mg Subcutaneous Q24H  . feeding supplement  237 mL Oral TID BM  . ferrous sulfate  325 mg Oral Q breakfast  . insulin aspart  0-5 Units Subcutaneous QHS  . insulin aspart  0-9 Units  Subcutaneous TID WC  . insulin glargine  6 Units Subcutaneous QHS  . metroNIDAZOLE  500 mg Oral Q8H  . simvastatin  20 mg Oral q1800  . sodium chloride  3 mL Intravenous Q12H  . vitamin B-12  100 mcg Oral Daily   Continuous Infusions:  PRN Meds:.acetaminophen, acetaminophen, albuterol, albuterol, ondansetron (ZOFRAN) IV, ondansetron, promethazine, traMADol Assessment/Plan: Principal Problem:  *Acute gastroenteritis  Active Problems:  Diabetes mellitus type 1, uncontrolled, insulin dependent  HYPERLIPIDEMIA  SUBSTANCE ABUSE, MULTIPLE  Tracheostomy dependence   1) Acute gastroenteritis: likely viral. Improving.  Patient with acute onset nausea vomiting and diarrhea with abdominal pain as described in history of present  illness following sick contact with family member.  U/A neg for nitrite or leukocytes.   Other differentials include acute pancreatitis (less likely, lipase 10 and other hepatobiliary pathology less likely.  - C. difficile PCR positive - IVF - Zofran PRN - If patient spikes high fever, will collect blood cultures and consider antibiotics as needed - KUB--?ileus  2) Tracheostomy: Stable tracheostomy tube. No surrounding erythema or signs of infection.  - regular trach care.   3) DM 1: hemoglobin A1c - 10.2 08/20/12 - Continue Lantus at home dose and SSI - CBG monitoring  4) Polysubstance abuse: Recent UDS negative for cocaine and other substances 07/2012.   - denies any recent illicit drug use.    Diet: clear liquid diet Dvt Ppx: lovenox Dispo: likely d/c home tomorrow  The patient does have a current PCP Blanch Media, MD), therefore will berequiring OPC follow-up after discharge.  The patient does not know have transportation limitations that hinder transportation to clinic appointments.  .Services Needed at time of discharge: Y = Yes, Blank = No PT:   OT:   RN:   Equipment:   Other:     LOS: 2 days   [name] 08/22/2012, 2:37 PM

## 2012-08-22 NOTE — Progress Notes (Signed)
Internal Medicine Teaching Service Attending Note Date: 08/22/2012  Patient name: [name]  Medical record number: [id number]  Date of birth: 07-04-1966    This patient has been seen and discussed with the house staff. Please see their note for complete details. I concur with their findings with the following additions/corrections:  Patient still with abdominal pain and emesis. We'll check plain films to make sure she has not developed an ileus. For now continue oral metronidazole.  Acey Lav 08/22/2012, 12:37 PM

## 2012-08-22 NOTE — Care Management Note (Addendum)
    Page 1 of 1   08/23/2012     2:42:36 PM   CARE MANAGEMENT NOTE 08/23/2012  Patient:  [name], [name]   Account Number:  [id number]  Date Initiated:  08/22/2012  Documentation initiated by:  Keandra Medero  Subjective/Objective Assessment:   PT ADM ON 08/20/12 WITH GASTROENTERITIS.  PTA, PT RESIDES AT HOME WITH MOTHER.  SHE HAS A TRACHEOSTOMY.     Action/Plan:   WILL FOLLOW FOR HOME NEEDS AS PT PROGRESSES.   Anticipated DC Date:  08/24/2012   Anticipated DC Plan:  HOME W HOME HEALTH SERVICES      DC Planning Services  CM consult      [facility] Choice  Resumption Of Svcs/PTA Provider   Choice offered to / List presented to:  C-1 Patient        HH arranged  HH-1 RN      [facility] agency  [facility]   Status of service:  Completed, signed off Medicare Important Message given?   (If response is "NO", the following Medicare IM given date fields will be blank) Date Medicare IM given:   Date Additional Medicare IM given:    Discharge Disposition:  HOME W HOME HEALTH SERVICES  Per UR Regulation:  Reviewed for med. necessity/level of care/duration of stay  If discussed at Long Length of Stay Meetings, dates discussed:    Comments:  08/23/12 Rosalita Chessman [phone] PT FOR DC HOME TODAY.  PT ACTIVE WITH [facility] PRIOR TO ADMISSION FOR HH NURSING. RESUMPTION ORDERS FOR HOME CARE OBTAINED FROM MD; FAXED ORDERS AND MD NOTES TO [phone], PER [facility]S REQUEST.

## 2012-08-22 NOTE — Progress Notes (Signed)
Lab supervisor called to explain phlebotomist concerned for safety with pt striking out.  Will no longer take labs unless pt sedated per lab.  Called MD and made aware.  Pt is calm, alert, and oriented.  Pt does not like to be touched and resists by striking out.  Telemetry monitor was discontinue 08/21/12 as pt would not allow [place] to keep on her.  Will continue to monitor. Thomas Hoff

## 2012-08-22 NOTE — Discharge Summary (Addendum)
Internal Medicine Teaching [facility] Discharge Note  Name: [name] MRN: [id number] DOB: 06-28-1966 47 y.o.  Date of Admission: 08/20/2012  9:14 AM Date of Discharge: 08/23/2012 Attending Physician: Dr. Daiva Eves  Discharge Diagnosis: Principal Problem:  *Clostridium difficile diarrhea Active Problems:  Diabetes mellitus type 1, uncontrolled, insulin dependent  Tracheostomy dependence  Acute gastroenteritis  Discharge Medications:   Medication List     As of 08/23/2012  8:19 PM    STOP taking these medications         pantoprazole 20 MG tablet   Commonly known as: PROTONIX      TAKE these medications         albuterol (2.5 MG/3ML) 0.083% nebulizer solution   Commonly known as: PROVENTIL   Take 3 mLs (2.5 mg total) by nebulization every 6 (six) hours as needed for wheezing.      albuterol 108 (90 BASE) MCG/ACT inhaler   Commonly known as: PROVENTIL HFA;VENTOLIN HFA   Inhale 2 puffs into the lungs every 4 (four) hours as needed for wheezing or shortness of breath.      divalproex 125 MG DR tablet   Commonly known as: DEPAKOTE   Take 125 mg by mouth daily.      feeding supplement Liqd   Take 237 mLs by mouth 3 (three) times daily between meals.      ferrous sulfate 325 (65 FE) MG tablet   Take 325 mg by mouth daily with breakfast.      ibuprofen 100 MG/5ML suspension   Commonly known as: ADVIL,MOTRIN   Take 20 mLs (400 mg total) by mouth every 6 (six) hours.      insulin glargine 100 UNIT/ML injection   Commonly known as: LANTUS   Inject 6 Units into the skin at bedtime.      insulin lispro 100 UNIT/ML injection   Commonly known as: HUMALOG   Inject 5-7 Units into the skin 3 (three) times daily before meals.      metroNIDAZOLE 500 MG tablet   Commonly known as: FLAGYL   Take 1 tablet (500 mg total) by mouth every 8 (eight) hours.      ondansetron 4 MG tablet   Commonly known as: ZOFRAN   Take 1 tablet (4 mg total) by mouth every 8 (eight) hours as  needed for nausea.      pravastatin 40 MG tablet   Commonly known as: PRAVACHOL   Take 1 tablet (40 mg total) by mouth at bedtime.      vitamin B-12 100 MCG tablet   Commonly known as: CYANOCOBALAMIN   Take 100 mcg by mouth daily.        Disposition and follow-up:   Ms.Breawna Renwick was discharged from [facility] in stable condition.  At the hospital follow up visit please address:  C diff colitis--discharged on oral flagyl  Gastroenteritis--discharged with zofran prn  Trach--how long does trach need to be in place? Also, mother called during admission and had concerns about Ms. Code being at home and whether she may need to be in SNF if mother is not able to continue with her care and supervision.  Encouraged her to come to follow up visit and discuss with pcp in detail to help come up with long term plan.    Follow-up Appointments: Follow-up Information    Follow up with Blanch Media, MD. On 09/10/2012. (@815am )    Contact information:   [address] [place] [address] [phone]  Discharge Orders    Future Appointments: Provider: Department: Dept Phone: Center:   09/10/2012 8:15 AM Burns Spain, MD [facility] [phone] [facility]     Future Orders Please Complete By Expires   Diet - low sodium heart healthy      Increase activity slowly      Call MD for:  temperature >100.4      Call MD for:  persistant nausea and vomiting      Call MD for:  severe uncontrolled pain      Call MD for:  persistant dizziness or light-headedness      Call MD for:  extreme fatigue        Consultations:  none  Procedures Performed:  Dg Chest 2 View  08/09/2012  *RADIOLOGY REPORT*  Clinical Data: Shortness of breath  CHEST - 2 VIEW  Comparison: May 30, 2012  Findings:  Tracheostomy is well seated.  Lungs clear.  Heart size and pulmonary vascularity are normal.  No adenopathy.  No bone lesions.  No pneumothorax.   IMPRESSION: Tracheostomy well seated.  Lungs clear.   Original Report Authenticated By: Bretta Bang, M.D.    Dg Abd Portable 1v  08/22/2012  *RADIOLOGY REPORT*  Clinical Data: Abdominal pain.  Question ileus.  PORTABLE ABDOMEN - 1 VIEW  Comparison: Chest and two views abdomen 05/29/2005.  Findings: The bowel gas pattern is normal.  The hemidiaphragms are off the superior margin of the film.  No abnormal abdominal calcification is seen.  No bony abnormality.  IMPRESSION: Negative exam.   Original Report Authenticated By: Holley Dexter, M.D.    Admission HPI: Patient is a pleasant 89 year woman with three-day history of nausea, vomiting and watery diarrhea along with abdominal pain. Patient denies any inciting events. She does report that her mother had similar symptoms before she had.  It all started around 3 AM Sunday. Worsening 5-6 vomiting spells every day without any blood in about a similar number of diarrhea spells. Lose diarrhea- cannot say she has any blood in it or is totally watery or formed.  Abdominal pain is diffuse-more prominent in lower abdomen- nothing makes it worse or better.  Cannot keep much food down.  Denies any fever or chills at home. Denies any headache, vision changes, palpitations, chest pain, short of breath.  She does complain of some cough for past few days- but that's been on and off.  She recently had tracheostomy in November 2013 and is doing her care at home with help of advanced home care.  Hospital Course by problem list: Clostridium difficile diarrhea--presented with complaints of acute onsent nausea, vomiting, and diarrhea.  Sick contact mother.  Found to be Cdiff positive 08/21/12.  Started on oral flagyl for total of 14 day course.  Recommended follow up with pcp.     Diabetes mellitus type 1, uncontrolled, insulin dependent--Hb A1C 10.2.  Continued on home dose Lantus during hospital course and sliding scale insulin. CBG monitoring during hospital  course.  Discharged back on home medications.  Follow up with pcp.     Tracheostomy dependence--stable during hospital course.  Cleaned and suctioned multiple times by respiratory and nursing for trach care.  Follow up with pcp and continue self care.     Acute gastroenteritis--presented with complaints of nausea, vomiting, diarrhea.  Found to have cdiff.  Started on oral flagyl.  Improvement of symptoms through hospital course.  Will need to follow up with pcp in [facility].  Tolerated regular diet.  Discharge Vitals:  BP 127/73  Pulse 73  Temp 98.2 F (36.8 C) (Oral)  Resp 20  Ht 5\' 5"  (1.651 m)  Wt 117 lb (53.071 kg)  BMI 19.47 kg/m2  SpO2 100%  LMP 08/08/2012  Discharge Labs:  Results for orders placed during the hospital encounter of 08/20/12 (from the past 24 hour(s))  GLUCOSE, CAPILLARY     Status: Abnormal   Collection Time   08/22/12  9:30 PM      Component Value Range   Glucose-Capillary 174 (*) 70 - 99 mg/dL  GLUCOSE, CAPILLARY     Status: Abnormal   Collection Time   08/23/12  6:21 AM      Component Value Range   Glucose-Capillary 104 (*) 70 - 99 mg/dL  GLUCOSE, CAPILLARY     Status: Abnormal   Collection Time   08/23/12 11:01 AM      Component Value Range   Glucose-Capillary 285 (*) 70 - 99 mg/dL   Signed: [name] 08/23/2012, 8:19 PM

## 2012-08-23 MED ORDER — ONDANSETRON HCL 4 MG PO TABS: 4.0000 mg | ORAL_TABLET | Freq: Three times a day (TID) | ORAL | Status: DC | PRN

## 2012-08-23 MED ORDER — METRONIDAZOLE 500 MG PO TABS: 500.0000 mg | ORAL_TABLET | Freq: Three times a day (TID) | ORAL | Status: AC

## 2012-08-23 NOTE — Progress Notes (Signed)
Pt/family given discharge instructions, medication lists, follow up appointments, and when to call the doctor.  Pt/family verbalizes understanding. Pt given signs and symptoms of infection. Pt given all instructions to read over at her leisure prior to signing.  Pt given three bus passes due to no transportation.  Pt [facility] to [facility] to ride bus home. Thomas Hoff

## 2012-08-23 NOTE — Progress Notes (Signed)
Subjective: Ms. Cronic was seen and examined at bedside.  She was eating breakfast with her family members in the room.  She continues to have diarrhea but is tolerating regular diet well.  She denies any fever, chills, chest pain, or shortness of breath at this time.      Objective: Vital signs in last 24 hours: Filed Vitals:   08/22/12 2131 08/22/12 2311 08/23/12 0309 08/23/12 0623  BP: 113/69   127/73  Pulse:  88 80 67  Temp: 97.9 F (36.6 C)   98.2 F (36.8 C)  TempSrc: Oral   Oral  Resp: 18 18 16 18   Height:      Weight:      SpO2: 100% 100% 100% 100%   Weight change:   Intake/Output Summary (Last 24 hours) at 08/23/12 0729 Last data filed at 08/22/12 1752  Gross per 24 hour  Intake    720 ml  Output      0 ml  Net    720 ml   Vitals reviewed. Constitutional: Vital signs reviewed. Thin female  Head: Normocephalic and atraumatic  Mouth: no erythema or exudates, dry mucous membranes  Eyes: PERRLA, EOMI, conjunctivae normal, No scleral icterus.  Neck: Supple, tracheostomy in place, no surrounding erythema or discharge.  Cardiovascular: RRR, S1 normal, S2 normal, no MRG  Pulmonary/Chest: CTAB, no wheezes, rales, or rhonchi  Abdominal: Soft. Non-tender to deep palpation, non-distended, bowel sounds are normal Musculoskeletal: No joint deformities, erythema, or stiffness, ROM full and no nontender Neurological: A&O x3, Strength is normal and symmetric bilaterally, cranial nerve II-XII are grossly intact, no focal motor deficit  Skin: Warm, dry and intact. No rash, cyanosis, or clubbing.  Psychiatric: Normal mood and affect. speech and behavior is normal.   Lab Results: Basic Metabolic Panel:  Lab 08/22/12 1610 08/20/12 0954  NA 139 136  K 3.9 3.8  CL 103 99  CO2 28 22  GLUCOSE 125* 304*  BUN 7 10  CREATININE 0.61 0.68  CALCIUM 8.9 9.1  MG 1.8 --  PHOS -- --   Liver Function Tests:  Lab 08/20/12 0954  AST 17  ALT 6  ALKPHOS 77  BILITOT 0.3  PROT 9.1*   ALBUMIN 4.0   CBC:  Lab 08/22/12 1507 08/20/12 0954  WBC 3.8* 6.6  NEUTROABS -- 5.6  HGB 9.9* 13.3  HCT 31.8* 41.0  MCV 63.7* 63.8*  PLT 150 198   CBG:  Lab 08/23/12 0621 08/22/12 2130 08/22/12 1614 08/22/12 1105 08/22/12 0629 08/21/12 2158  GLUCAP 104* 174* 157* 172* 195* 259*   Hemoglobin A1C:  Lab 08/20/12 0954  HGBA1C 10.2*   Urine Drug Screen: Drugs of Abuse     Component Value Date/Time   LABOPIA NONE DETECTED 08/09/2012 1314   COCAINSCRNUR NONE DETECTED 08/09/2012 1314   LABBENZ NONE DETECTED 08/09/2012 1314   AMPHETMU NONE DETECTED 08/09/2012 1314   THCU NONE DETECTED 08/09/2012 1314   LABBARB NONE DETECTED 08/09/2012 1314    Urinalysis:  Lab 08/20/12 2120  COLORURINE YELLOW  LABSPEC 1.026  PHURINE 5.0  GLUCOSEU >1000*  HGBUR NEGATIVE  BILIRUBINUR NEGATIVE  KETONESUR 40*  PROTEINUR NEGATIVE  UROBILINOGEN 0.2  NITRITE NEGATIVE  LEUKOCYTESUR NEGATIVE   Medications: I have reviewed the patient's current medications. Scheduled Meds:    . divalproex  125 mg Oral Daily  . enoxaparin (LOVENOX) injection  40 mg Subcutaneous Q24H  . feeding supplement  237 mL Oral TID BM  . ferrous sulfate  325 mg Oral Q breakfast  .  insulin aspart  0-5 Units Subcutaneous QHS  . insulin aspart  0-9 Units Subcutaneous TID WC  . insulin glargine  6 Units Subcutaneous QHS  . metroNIDAZOLE  500 mg Oral Q8H  . simvastatin  20 mg Oral q1800  . sodium chloride  3 mL Intravenous Q12H  . vitamin B-12  100 mcg Oral Daily   Continuous Infusions:  PRN Meds:.acetaminophen, acetaminophen, albuterol, albuterol, ondansetron (ZOFRAN) IV, ondansetron, promethazine, traMADol Assessment/Plan: Ms. Runyon is a 47 year old African American female with significant PMH for tracheostomy dependence, polysubstance abuse, CAD, and DM1 presenting with acute gastroenteritis and found to be Cdiff positive.     1) C diff colitis--pcr positive 08/21/12.   -on oral flagyl day 3 for total 14 days  2)  Acute gastroenteritis: likely viral. Improving.  Presented with acute onset nausea vomiting and diarrhea with mild abdominal pain following sick contact with family member.  U/A neg for nitrite or leukocytes.  Other differentials included acute pancreatitis, but less likely with lipase 10 and other hepatobiliary pathology less likely. Remained afebrile with no leukocytosis.   - C. difficile PCR positive - IVF - Zofran PRN - If patient spikes high fever, will collect blood cultures and consider antibiotics as needed - KUB--negative study  2) Tracheostomy: Stable tracheostomy tube. No surrounding erythema or signs of infection.  - regular trach care.   3) DM 1: hemoglobin A1c - 10.2 08/20/12 - Continue Lantus at home dose and SSI - CBG monitoring  4) Polysubstance abuse: Recent UDS negative for cocaine and other substances 07/2012.   - denies any recent illicit drug use.    Diet: carb modified Dvt Ppx: lovenox Dispo: d/c home today  The patient does have a current PCP Blanch Media, MD), therefore will berequiring [facility] follow-up after discharge.  The patient does not know have transportation limitations that hinder transportation to clinic appointments.  Services Needed at time of discharge: Y = Yes, Blank = No PT:   OT:   RN:   Equipment:   Other:     LOS: 3 days   [name] 08/23/2012, 7:29 AM

## 2012-08-27 ENCOUNTER — Telehealth: Payer: Self-pay | Admitting: *Deleted

## 2012-08-27 NOTE — Telephone Encounter (Signed)
Pt called asking for letter stating she was seen in clinic.  Front desk will do this for pt.  Pt sounded weak to chilon so call transferred to Triage. I talked with pt and she states she is not breathing well, not doing good.  She was home and needed help. I asked if she could call 911 and she said yes, I asked her to hang up and call 911, she said I will.

## 2012-08-27 NOTE — Telephone Encounter (Signed)
I called pt again and she states she is feeling better and not having any trouble breathing.  Daughter is with her.

## 2012-08-27 NOTE — Telephone Encounter (Signed)
I agree with 911 call. But she doesn't appear to have checked to the ER. She is not really capable of taking care of trach.

## 2012-08-28 ENCOUNTER — Telehealth: Payer: Self-pay | Admitting: Dietician

## 2012-08-28 MED ORDER — INSULIN LISPRO 100 UNIT/ML ~~LOC~~ SOLN: 5.0000 [IU] | Freq: Three times a day (TID) | SUBCUTANEOUS | Status: DC

## 2012-08-28 MED ORDER — INSULIN GLARGINE 100 UNIT/ML ~~LOC~~ SOLN: 6.0000 [IU] | Freq: Every day | SUBCUTANEOUS | Status: DC

## 2012-08-28 NOTE — Telephone Encounter (Signed)
Discharge date:08-22-12 Call date: 08-28-12 Hospital follow up appointment date: Follow up with Blanch Media, MD. On 09/10/2012. (815am) Calling to assist with transition of care from hospital to home.  Discharge medications reviewed: No  Able to fill all prescriptions? Yes per pt-  said she needs more strips and insulin- noted to her that strips should be able to be refilled, but do not see refills on insulin. - will request refills on insulin  Patient aware of hospital follow up appointments. yes  Does pt. Have problems with transportation? . Wants [place] to fax paper before her appointment [phone] or [phone]  Other problems/concerns: feels better today- trach was bothering her yesterday- she called Dr. Lucky Rathke office and they were able to assist her and she made appointment Dr. Pollyann Kennedy for 09-08-12, home health is also seeing her, she thinks their last visit was yesterday or day before.

## 2012-08-30 ENCOUNTER — Other Ambulatory Visit: Payer: Self-pay | Admitting: Dietician

## 2012-08-30 NOTE — Telephone Encounter (Signed)
Patient called to request refill for iron pills.

## 2012-09-02 ENCOUNTER — Other Ambulatory Visit: Payer: Self-pay | Admitting: Dietician

## 2012-09-02 DIAGNOSIS — E1065 Type 1 diabetes mellitus with hyperglycemia: Secondary | ICD-10-CM

## 2012-09-02 MED ORDER — GLUCOSE BLOOD VI STRP: ORAL_STRIP | Status: DC

## 2012-09-02 MED ORDER — FERROUS SULFATE 325 (65 FE) MG PO TABS: 325.0000 mg | ORAL_TABLET | Freq: Every day | ORAL | Status: DC

## 2012-09-02 MED ORDER — ACCU-CHEK FASTCLIX LANCETS MISC: 1.0000 | Freq: Four times a day (QID) | Status: DC

## 2012-09-02 NOTE — Telephone Encounter (Signed)
Requests iron tablets, insulin and strips, glucose tablets as soon as possible. Called [facility], iron and both insulin are ready for pick up. Glucose tablets/getl are not covered by insurance therefore no rx needed. Will request strips and lancets.   Called patient and she will have someone pick up her medicines and supplies.

## 2012-09-05 ENCOUNTER — Telehealth: Payer: Self-pay | Admitting: Dietician

## 2012-09-05 NOTE — Telephone Encounter (Signed)
Received call from Dr. Lucky Rathke office. Patient had hypoglycemia in their office. It was treated with 5 packs sugar, cookies, peanut butter crackers and glucose tablets and is now 106mg /dl and she is fine. They are getting ready to let her go home. They will fax note to our office.

## 2012-09-10 ENCOUNTER — Encounter: Payer: Self-pay | Admitting: Internal Medicine

## 2012-09-10 ENCOUNTER — Encounter: Payer: Self-pay | Admitting: Licensed Clinical Social Worker

## 2012-09-10 ENCOUNTER — Ambulatory Visit (INDEPENDENT_AMBULATORY_CARE_PROVIDER_SITE_OTHER): Payer: Medicaid Other | Admitting: Internal Medicine

## 2012-09-10 VITALS — BP 142/85 | HR 100 | Temp 98.6°F | Ht 61.0 in | Wt 117.3 lb

## 2012-09-10 DIAGNOSIS — Z0000 Encounter for general adult medical examination without abnormal findings: Secondary | ICD-10-CM

## 2012-09-10 DIAGNOSIS — Z930 Tracheostomy status: Secondary | ICD-10-CM

## 2012-09-10 DIAGNOSIS — F99 Mental disorder, not otherwise specified: Secondary | ICD-10-CM

## 2012-09-10 DIAGNOSIS — E1065 Type 1 diabetes mellitus with hyperglycemia: Secondary | ICD-10-CM

## 2012-09-10 DIAGNOSIS — F1910 Other psychoactive substance abuse, uncomplicated: Secondary | ICD-10-CM

## 2012-09-10 DIAGNOSIS — E785 Hyperlipidemia, unspecified: Secondary | ICD-10-CM

## 2012-09-10 DIAGNOSIS — F489 Nonpsychotic mental disorder, unspecified: Secondary | ICD-10-CM

## 2012-09-10 MED ORDER — ALBUTEROL SULFATE HFA 108 (90 BASE) MCG/ACT IN AERS: 2.0000 | INHALATION_SPRAY | RESPIRATORY_TRACT | Status: DC | PRN

## 2012-09-10 MED ORDER — DIVALPROEX SODIUM 125 MG PO DR TAB: 125.0000 mg | DELAYED_RELEASE_TABLET | Freq: Every day | ORAL | Status: DC

## 2012-09-10 NOTE — Assessment & Plan Note (Signed)
I did not ask about gyn appts as trach issues prevented it.

## 2012-09-10 NOTE — Progress Notes (Signed)
  Subjective:    Patient ID: [name], female    DOB: 09-14-1965, 47 y.o.   MRN: [id number]  HPI  Please see the A&P for the status of the pt's chronic medical problems. Med Rec was not able to be completed as pt did not bring in meds and pt had so many issues with trach that it took precedence.   Review of Systems  Unable to perform ROS: Other  Constitutional: Negative for fever.  Gastrointestinal: Positive for abdominal pain. Negative for nausea, vomiting and diarrhea.  Neurological:       Fall on ice       Objective:   Physical Exam  Constitutional: She appears well-developed and well-nourished. She appears distressed.  Appears older than age. Loud, constant grunting / resp noise / trach issues.  HENT:  Head: Normocephalic and atraumatic.  Right Ear: External ear normal.  Left Ear: External ear normal.  Nose: Nose normal.  Eyes: Conjunctivae are normal.  misaligned eyes  Cardiovascular: Normal rate, regular rhythm and normal heart sounds.   Pulmonary/Chest: Effort normal and breath sounds normal. She has no wheezes.  Abdominal: Soft. Bowel sounds are normal. She exhibits no distension. There is no tenderness.  Musculoskeletal: Normal range of motion. She exhibits no edema and no tenderness.  L knee nl ROM  Neurological: She is alert.  Skin: Skin is warm and dry. No rash noted. She is not diaphoretic. No erythema. No pallor.  ecchymosis L knee  Psychiatric:  Cannot assess 2/2 communications issues 2/2 trach          Assessment & Plan:

## 2012-09-10 NOTE — Progress Notes (Signed)
Patient ID: [name], female   DOB: 20-Jul-1965, 47 y.o.   MRN: [id number] CSW discussed with pt's PCP potential service needs.  Pt presents today for scheduled [facility] appt/hospital follow up.  During hospitalization, EMR notes mother voiced concern regarding ability to care and pt may need SNF.  [name] was d/c to home with home health and denies need for SNF placement.  Pt is in agreement for PCS request and referral to [facility], as pt now has [place] [facility]. Referral completed to [facility] and CSW initiated [facility] request.

## 2012-09-10 NOTE — Progress Notes (Signed)
RTs late entry note: Rt was called to help put patients trach back in. RT cleaned patients trach and reinserted the trach. Patient had BLB sounds and able to speak with PMV in place. RT suctioned patient. Patients vital signs and were good. HR 100 rate 18 sats 99% on room air.

## 2012-09-10 NOTE — Patient Instructions (Addendum)
General Instructions:  1. Bring all of your medicine bottles to every visit. 2. Edson Snowball will talk to you about an aide. 3. See me in one month. 4. I gave you handouts on falls and diabetes. 5. See Dr Pollyann Kennedy for any issues with your trach. 6. Talk to your family about whether it is best for your health for you to stay at home or go to a care center.  Treatment Goals:  Goals (1 Years of Data) as of 09/10/12         As of Today 08/23/12 08/22/12 08/22/12 08/22/12     Blood Pressure    . Blood Pressure < 140/90  142/85 127/73 113/69 120/71 134/74     Result Component    . HEMOGLOBIN A1C < 8.5          . LDL CALC < 100            Progress Toward Treatment Goals:  Treatment Goal 09/10/2012  Hemoglobin A1C deteriorated  Other  unable to assess    Self Care Goals & Plans:  Self Care Goal 09/10/2012  Manage my medications bring my medications to every visit    Home Blood Glucose Monitoring 09/10/2012  Check my blood sugar 4 times a day  When to check my blood sugar before breakfast; before lunch; before dinner; at bedtime     Care Management & Community Referrals:  Referral 09/10/2012  Referrals made for care management support social worker; diabetes educator; nutritionist  Referrals made to community resources falls prevention

## 2012-09-10 NOTE — Assessment & Plan Note (Signed)
I will ask [facility] or [facility] to do med rec as I need to know what is in her home and want to ensure that she is not taking the PPI.

## 2012-09-10 NOTE — Assessment & Plan Note (Signed)
Lab Results  Component Value Date   HGBA1C 10.2* 08/20/2012   HGBA1C 8.4 03/19/2012   HGBA1C 8.6 12/19/2011     Assessment:  Diabetes control: poor control (HgbA1C >9%)  Progress toward A1C goal:  deteriorated  Comments: Poor control. I do not think pt is able to control her glucose and needs a higher level of care. However, even when she was in SNF, we couldn't control her DM bc she signed out and missed insulin doses, meals, CBG's, and ate improperly.   Plan:  Medications:  continue current medications  Home glucose monitoring:   Frequency: 4 times a day   Timing: before breakfast;before lunch;before dinner;at bedtime  Instruction/counseling given: reminded to bring blood glucose meter & log to each visit and discussed foot care  Educational resources provided: handout  Self management tools provided: copy of home glucose meter download  Other plans: Cont to offer support. Pt refused SNF but is agreeable to proper aide in home.

## 2012-09-10 NOTE — Assessment & Plan Note (Signed)
Need home med rec to ensure that she is on a statin.

## 2012-09-10 NOTE — Assessment & Plan Note (Signed)
I will ask Edson Snowball to assist with mental health referral. She was D/C'd on Depakote so I refilled one month until she can get into mental health. I am not using this for a medical reason so want mental health to eval and determine necessity.

## 2012-09-10 NOTE — Assessment & Plan Note (Signed)
I was not able to ask about last cocaine use 2/2 trach issues

## 2012-09-10 NOTE — Assessment & Plan Note (Signed)
She states that she just saw Dr Pollyann Kennedy last week andthat her CBG was low there. She states her trach issues and the noise was worse then. She doesn't remember what he said 2/2 the hypoglycemia. Resp came down to assist with the trach.

## 2012-09-12 ENCOUNTER — Telehealth: Payer: Self-pay | Admitting: Dietician

## 2012-09-12 NOTE — Telephone Encounter (Signed)
Patient called saying she needed her inhalers. Informed her that chart says she has refills at [facility] on [address]. [name] said she'd call them

## 2012-09-13 ENCOUNTER — Telehealth: Payer: Self-pay | Admitting: Dietician

## 2012-09-13 NOTE — Telephone Encounter (Signed)
She asked me for ABX refill at the appt and I told her that she didn't need it refilled. Was tx for C diff and was no longer having sig D or ABD pain. IF those sxs recur, then she would need to submit stool sample to check for C Diff recurrence.

## 2012-09-13 NOTE — Telephone Encounter (Signed)
Says her eye sight is bad and blurry. Wants appointment to see eye doctor. Also, says she Needs refill on antibiotic.  Left message that I'd route note to triage and Dr Rogelia Boga about antibiotic. Gave her Dr. Telford Nab phone number to call call for an appointment.

## 2012-09-13 NOTE — Telephone Encounter (Signed)
Discussed with triage, no antibiotic noted. Per Dr. Telford Nab office, no need for a referral. Called and informed patient of same. She reports blood sugar dropped last night, ambulance came to house. Better now. Stated importance to checking blood sugar often. Daughter and mother are helping her. No other needs identified.

## 2012-09-17 ENCOUNTER — Encounter: Payer: Self-pay | Admitting: Licensed Clinical Social Worker

## 2012-09-17 NOTE — Progress Notes (Signed)
Patient ID: [name], female   DOB: 1966/01/30, 47 y.o.   MRN: [id number] [place] RN, care manager P4CC: I performed the initial home visit with this pt. Our goal is for pt. to be linked to [facility] for her medical appts. & to provide assistance to her from [facility] food pantry & nutritional services to help her maintain a well-balanced diet. She demonstrated the ability to perform her own trach care & suctioning without difficulty.

## 2012-09-17 NOTE — Progress Notes (Signed)
Thanks [employer]. Can you pass on these two requests?  1. Med rec - I would really like to know what she has at home and is taking. She seems very reliable about her meds. I just want to verify. 2. When she was admitted, the mother called and spoke to the inpt team and said she could no longer care for her at home and wanted her placed. The mother has never called me, communicated with me, and Germani says everything is OK at home. Since [facility] is in the home, would they please speak to the mother and daughter (Astria's daughter) to ensure that they plan to cont to offer support to [place] in the home? Thanks

## 2012-09-18 ENCOUNTER — Emergency Department (HOSPITAL_COMMUNITY)
Admission: EM | Admit: 2012-09-18 | Discharge: 2012-09-18 | Disposition: A | Discharge status: Home / Self Care | Payer: Medicaid Other | Attending: Emergency Medicine | Admitting: Emergency Medicine

## 2012-09-18 ENCOUNTER — Encounter (HOSPITAL_COMMUNITY): Payer: Self-pay | Admitting: *Deleted

## 2012-09-18 DIAGNOSIS — Z8619 Personal history of other infectious and parasitic diseases: Secondary | ICD-10-CM | POA: Insufficient documentation

## 2012-09-18 DIAGNOSIS — Z8659 Personal history of other mental and behavioral disorders: Secondary | ICD-10-CM | POA: Insufficient documentation

## 2012-09-18 DIAGNOSIS — R51 Headache: Secondary | ICD-10-CM | POA: Insufficient documentation

## 2012-09-18 DIAGNOSIS — Z794 Long term (current) use of insulin: Secondary | ICD-10-CM | POA: Insufficient documentation

## 2012-09-18 DIAGNOSIS — Z8611 Personal history of tuberculosis: Secondary | ICD-10-CM | POA: Insufficient documentation

## 2012-09-18 DIAGNOSIS — R55 Syncope and collapse: Secondary | ICD-10-CM | POA: Insufficient documentation

## 2012-09-18 DIAGNOSIS — E1069 Type 1 diabetes mellitus with other specified complication: Secondary | ICD-10-CM | POA: Insufficient documentation

## 2012-09-18 DIAGNOSIS — Z8639 Personal history of other endocrine, nutritional and metabolic disease: Secondary | ICD-10-CM | POA: Insufficient documentation

## 2012-09-18 DIAGNOSIS — Z862 Personal history of diseases of the blood and blood-forming organs and certain disorders involving the immune mechanism: Secondary | ICD-10-CM | POA: Insufficient documentation

## 2012-09-18 DIAGNOSIS — Z930 Tracheostomy status: Secondary | ICD-10-CM | POA: Insufficient documentation

## 2012-09-18 DIAGNOSIS — E162 Hypoglycemia, unspecified: Secondary | ICD-10-CM

## 2012-09-18 DIAGNOSIS — R404 Transient alteration of awareness: Secondary | ICD-10-CM | POA: Insufficient documentation

## 2012-09-18 DIAGNOSIS — Z8719 Personal history of other diseases of the digestive system: Secondary | ICD-10-CM | POA: Insufficient documentation

## 2012-09-18 DIAGNOSIS — I2510 Atherosclerotic heart disease of native coronary artery without angina pectoris: Secondary | ICD-10-CM | POA: Insufficient documentation

## 2012-09-18 DIAGNOSIS — I10 Essential (primary) hypertension: Secondary | ICD-10-CM | POA: Insufficient documentation

## 2012-09-18 DIAGNOSIS — E785 Hyperlipidemia, unspecified: Secondary | ICD-10-CM | POA: Insufficient documentation

## 2012-09-18 DIAGNOSIS — Z79899 Other long term (current) drug therapy: Secondary | ICD-10-CM | POA: Insufficient documentation

## 2012-09-18 DIAGNOSIS — Z87891 Personal history of nicotine dependence: Secondary | ICD-10-CM | POA: Insufficient documentation

## 2012-09-18 LAB — GLUCOSE, CAPILLARY
Glucose-Capillary: 77 mg/dL (ref 70–99)
Glucose-Capillary: 135 mg/dL — ABNORMAL HIGH (ref 70–99)

## 2012-09-18 NOTE — ED Notes (Signed)
Pt continues to eat from diet tray.

## 2012-09-18 NOTE — ED Notes (Signed)
Notified RN of CBG 77
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
## 2012-09-18 NOTE — ED Notes (Addendum)
In to suction pt trach, requesting to wait until after she eats. Meal tray has been ordered.

## 2012-09-18 NOTE — ED Notes (Signed)
Brought to ED via [facility] for eval of hypoglycemia. Found to have cbg of 27 and combative. Last sugar was 52 pta after treatment. Pt now alert and cooperative

## 2012-09-19 ENCOUNTER — Other Ambulatory Visit: Payer: Self-pay | Admitting: Internal Medicine

## 2012-09-19 NOTE — Progress Notes (Signed)
Response from [facility], [place] RN These are the medications pt. is on @ home: Albuterol Sulfate 0.083% - 1 vial Q6hrs, prn; Divalproex DR 125mg  - 1 daily; Feeding Supplement - 1-3 daily; Ferrous Sulfate 325mg  - 1 daily; Humalog Insulin - 5-10 units per sliding scale TID before meals; Lantus Insulin 6 units QHS; Pravastatin 80 mg - 1 QHS; Proair HFA AER - 2 inhalations Q4hrs, prn; Vitamin B12 - 1 daily.  Pt's mother & live in boyfriend was present during home visit. There was no mention made of alternate placement. Her mother & boyfriend stated, they assist w/ pt's care when needed; however, pt. demonstrated the ability to provide most of the care required. I don't think pt. would be agreeable to inpatient placement.

## 2012-09-19 NOTE — Progress Notes (Signed)
Updated med list per [facility] who assessed meds in home.

## 2012-09-19 NOTE — Progress Notes (Signed)
I have forwarded your questions to West Bali, RN with [facility].  If you need to contact Maretha and I am not available, her number will be listed on the front of pt's snapshot under specialty comments:  West Bali RN, [facility] care manager Phone# [phone] Fax# [phone]

## 2012-09-22 ENCOUNTER — Emergency Department (HOSPITAL_COMMUNITY)
Admission: EM | Admit: 2012-09-22 | Discharge: 2012-09-22 | Disposition: A | Discharge status: Home / Self Care | Payer: Medicaid Other | Attending: Emergency Medicine | Admitting: Emergency Medicine

## 2012-09-22 ENCOUNTER — Encounter (HOSPITAL_COMMUNITY): Payer: Self-pay | Admitting: Emergency Medicine

## 2012-09-22 DIAGNOSIS — Z9889 Other specified postprocedural states: Secondary | ICD-10-CM | POA: Insufficient documentation

## 2012-09-22 DIAGNOSIS — Z8619 Personal history of other infectious and parasitic diseases: Secondary | ICD-10-CM | POA: Insufficient documentation

## 2012-09-22 DIAGNOSIS — Z794 Long term (current) use of insulin: Secondary | ICD-10-CM | POA: Insufficient documentation

## 2012-09-22 DIAGNOSIS — Z8719 Personal history of other diseases of the digestive system: Secondary | ICD-10-CM | POA: Insufficient documentation

## 2012-09-22 DIAGNOSIS — Z79899 Other long term (current) drug therapy: Secondary | ICD-10-CM | POA: Insufficient documentation

## 2012-09-22 DIAGNOSIS — D509 Iron deficiency anemia, unspecified: Secondary | ICD-10-CM | POA: Insufficient documentation

## 2012-09-22 DIAGNOSIS — E162 Hypoglycemia, unspecified: Secondary | ICD-10-CM

## 2012-09-22 DIAGNOSIS — Y838 Other surgical procedures as the cause of abnormal reaction of the patient, or of later complication, without mention of misadventure at the time of the procedure: Secondary | ICD-10-CM | POA: Insufficient documentation

## 2012-09-22 DIAGNOSIS — I10 Essential (primary) hypertension: Secondary | ICD-10-CM | POA: Insufficient documentation

## 2012-09-22 DIAGNOSIS — E1169 Type 2 diabetes mellitus with other specified complication: Secondary | ICD-10-CM | POA: Insufficient documentation

## 2012-09-22 DIAGNOSIS — I2510 Atherosclerotic heart disease of native coronary artery without angina pectoris: Secondary | ICD-10-CM | POA: Insufficient documentation

## 2012-09-22 DIAGNOSIS — R0609 Other forms of dyspnea: Secondary | ICD-10-CM | POA: Insufficient documentation

## 2012-09-22 DIAGNOSIS — Z8659 Personal history of other mental and behavioral disorders: Secondary | ICD-10-CM | POA: Insufficient documentation

## 2012-09-22 DIAGNOSIS — Z8611 Personal history of tuberculosis: Secondary | ICD-10-CM | POA: Insufficient documentation

## 2012-09-22 DIAGNOSIS — Z8639 Personal history of other endocrine, nutritional and metabolic disease: Secondary | ICD-10-CM | POA: Insufficient documentation

## 2012-09-22 DIAGNOSIS — R0600 Dyspnea, unspecified: Secondary | ICD-10-CM

## 2012-09-22 DIAGNOSIS — E785 Hyperlipidemia, unspecified: Secondary | ICD-10-CM | POA: Insufficient documentation

## 2012-09-22 DIAGNOSIS — Z87891 Personal history of nicotine dependence: Secondary | ICD-10-CM | POA: Insufficient documentation

## 2012-09-22 DIAGNOSIS — R0989 Other specified symptoms and signs involving the circulatory and respiratory systems: Secondary | ICD-10-CM | POA: Insufficient documentation

## 2012-09-22 DIAGNOSIS — Z862 Personal history of diseases of the blood and blood-forming organs and certain disorders involving the immune mechanism: Secondary | ICD-10-CM | POA: Insufficient documentation

## 2012-09-22 DIAGNOSIS — J9503 Malfunction of tracheostomy stoma: Secondary | ICD-10-CM | POA: Insufficient documentation

## 2012-09-22 LAB — CBC WITH DIFFERENTIAL/PLATELET
Eosinophils Absolute: 0 10*3/uL (ref 0.0–0.7)
Eosinophils Relative: 1 % (ref 0–5)
HCT: 37 % (ref 36.0–46.0)
Hemoglobin: 12.1 g/dL (ref 12.0–15.0)
Lymphocytes Relative: 38 % (ref 12–46)
Lymphs Abs: 1.7 10*3/uL (ref 0.7–4.0)
MCH: 20.3 pg — ABNORMAL LOW (ref 26.0–34.0)
MCV: 62 fL — ABNORMAL LOW (ref 78.0–100.0)
Monocytes Absolute: 0.3 10*3/uL (ref 0.1–1.0)
Monocytes Relative: 6 % (ref 3–12)
RBC: 5.97 MIL/uL — ABNORMAL HIGH (ref 3.87–5.11)

## 2012-09-22 LAB — COMPREHENSIVE METABOLIC PANEL
Alkaline Phosphatase: 63 U/L (ref 39–117)
BUN: 5 mg/dL — ABNORMAL LOW (ref 6–23)
CO2: 24 mEq/L (ref 19–32)
Calcium: 9.3 mg/dL (ref 8.4–10.5)
GFR calc Af Amer: >90 mL/min (ref >90)
GFR calc non Af Amer: >90 mL/min (ref >90)
Glucose, Bld: 182 mg/dL — ABNORMAL HIGH (ref 70–99)
Total Protein: 8.6 g/dL — ABNORMAL HIGH (ref 6.0–8.3)

## 2012-09-22 LAB — GLUCOSE, CAPILLARY: Glucose-Capillary: <10 mg/dL — CL (ref 70–99)

## 2012-09-22 MED ORDER — DEXAMETHASONE SODIUM PHOSPHATE 10 MG/ML IJ SOLN
10.0000 mg | Freq: Once | INTRAMUSCULAR | Status: AC
  Administered 2012-09-22: 10 mg via INTRAMUSCULAR
  Filled 2012-09-22: qty 1

## 2012-09-22 MED ORDER — DEXTROSE 50 % IV SOLN
INTRAVENOUS | Status: AC
  Administered 2012-09-22: 11:00:00
  Filled 2012-09-22: qty 50

## 2012-09-22 NOTE — Progress Notes (Signed)
Called to evaluate patient due to Janina Mayo being out.  Unable to reinsert trach the opening had already closed up to much to be able to pass trach into airway.  Patient is slightly short of breath, but improved with dressing applied over trach opening and pressure when patient coughs.  O2 sat 100% on room air.  RT will continue to monitor.

## 2012-09-22 NOTE — ED Notes (Signed)
Pt taken to bathroom prior to d/c; verified with Dr Ranae Palms that pt needs to follow up with PCP about home care for trach care; pt given extra trach set and extra pieces to trach that was placed in ED; pt alert and mentating appropriately upon d/c. Pt denies pain upon d/c and denies difficulty breathing; pt taken to waiting room to wait for mother to come pick her up from there. Pt leaving with d/c teaching and follow up care instructions
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
## 2012-09-22 NOTE — Progress Notes (Signed)
   ENT Progress Note: Pt presents to ER with airway issues and dislodged trach. Trach placed by Dr. Pollyann Kennedy ~47mo ago for pos. Angioedema. Pt stable with 6 shiley trach, returns to office for trach changes. Trach dislodged this am. Mild stridor and SOB. Trach replaced by EDP with #4 Noncuffed Shiley, airway stable.   Subjective: Pt stable, airway intact.  Objective: Vital signs in last 24 hours: Temp:  [98 F (36.7 C)] 98 F (36.7 C) (03/09 0648) Pulse Rate:  [87-107] 100 (03/09 1032) Resp:  [20-22] 22 (03/09 1032) BP: (131-141)/(80-99) 141/99 mmHg (03/09 0908) SpO2:  [98 %-100 %] 100 % (03/09 1032) Weight change:       PHYSICAL EXAM: Flex scope via trach - airway stable, no bleeding, tol capped trach with PMV   Assessment/Plan: Pt stable from airway standpoint. Plan f/u with Dr. Pollyann Kennedy next week D/C with #4 Maurice Small, DAVID 09/22/2012, 11:01 AM

## 2012-09-22 NOTE — ED Notes (Signed)
Patient was eating cereal, felt like something was caught on trach.  Patient tried to suction self out.  [name] came out.  Patient moving good air, o2 sat 100% enroute to ED.

## 2012-09-22 NOTE — Progress Notes (Signed)
#  4 cuffless shiley trach inserted by ED physician.  Patient has equal BBS and positive CO2 color change.  Trach secured with trach ties and obturator placed at head of bed.  O2 sat 100% on room air.  RT will continue to monitor.

## 2012-09-22 NOTE — ED Notes (Signed)
Dressing applied to trach, pt is sleeping. In NAD.

## 2012-09-22 NOTE — ED Notes (Signed)
Pt is alert and oriented. Using valve to communicate. Drinking orange juice at this time. Dr. Annalee Genta has been in to see patient. We have 4 uncuffed tube for patient to discharge with. Janina Mayo is back in place, no difficulties noted.

## 2012-09-22 NOTE — ED Notes (Signed)
Pt alert and oriented; pt speaking with trach in place; pt 100% on nasal canula; suction remains set up at bedside; pt denies pain; pt able to speak in clear sentences

## 2012-09-22 NOTE — ED Notes (Signed)
Pt was difficulty IV stick. Has 22 in left foot. Had 24 in right wrist, which became infiltrated after pushing D50.

## 2012-09-22 NOTE — ED Notes (Signed)
Pt trach has come out. Dressing applied by respiratory, pt 100% on RA

## 2012-09-22 NOTE — ED Notes (Signed)
Report received from Eleonore Chiquito, RN

## 2012-09-22 NOTE — ED Notes (Signed)
Pt's CBG 219 on arrival to ED.

## 2012-09-22 NOTE — ED Notes (Signed)
Pt states can't breathe. 100% RA. Congested breathing from trach. EDP at bedside. Getting 4 and 6 uncuffed trach from materials.

## 2012-09-22 NOTE — ED Notes (Signed)
Ambulated patient, maintained 100%.

## 2012-09-23 ENCOUNTER — Other Ambulatory Visit: Payer: Self-pay | Admitting: Internal Medicine

## 2012-09-23 LAB — GLUCOSE, CAPILLARY: Glucose-Capillary: 219 mg/dL — ABNORMAL HIGH (ref 70–99)

## 2012-09-23 NOTE — ED Provider Notes (Signed)
Medical screening examination/treatment/procedure(s) were conducted as a shared visit with non-physician practitioner(s) and myself.  I personally evaluated the patient during the encounter Pt with hx iddm. Had not eaten, low bs. Now resolved. Ate meal in ed. No c/o nvd. No fever. States feels well. abd soft nt.    Suzi Roots, MD 09/23/12 [phone]

## 2012-09-24 ENCOUNTER — Other Ambulatory Visit: Payer: Self-pay | Admitting: *Deleted

## 2012-09-24 DIAGNOSIS — E109 Type 1 diabetes mellitus without complications: Secondary | ICD-10-CM

## 2012-09-24 NOTE — Telephone Encounter (Signed)
Pt calls and ask for trach supplies, ... A #4 cuff, a #4 cfs and strap ties... i contacted [facility], spoke w/ shelia, she will speak w/ resp care therapist that have been working w/ pt and they will call pt to find what problem she is having at this time, they were called twice sat to the home for problems. They will call triage back if there are other issues going on.

## 2012-09-25 ENCOUNTER — Telehealth: Payer: Self-pay | Admitting: *Deleted

## 2012-09-25 MED ORDER — "INSULIN SYRINGE 31G X 5/16"" 1 ML MISC": Status: DC

## 2012-09-25 NOTE — Telephone Encounter (Signed)
Pt had called for new trach supplies, #4 , i called [facility] and it is stated pt trach went from a #6 to #4 recently, i checked the ED note 3/9 and indeed pt was switched, [facility] has a signed order from [employer] md, supplies have been shipped

## 2012-10-01 ENCOUNTER — Ambulatory Visit: Payer: Medicaid Other | Admitting: Internal Medicine

## 2012-10-03 ENCOUNTER — Telehealth: Payer: Self-pay | Admitting: Dietician

## 2012-10-03 NOTE — Telephone Encounter (Signed)
Called asking for appointment for [facility], and one here. Will call her back

## 2012-10-04 NOTE — Telephone Encounter (Signed)
[name] phone number : [phone]

## 2012-10-08 ENCOUNTER — Ambulatory Visit: Payer: Medicaid Other | Admitting: Internal Medicine

## 2012-10-11 ENCOUNTER — Encounter (INDEPENDENT_AMBULATORY_CARE_PROVIDER_SITE_OTHER): Payer: Medicaid Other | Admitting: Medical

## 2012-10-11 ENCOUNTER — Inpatient Hospital Stay (HOSPITAL_COMMUNITY)
Admission: AD | Admit: 2012-10-11 | Discharge: 2012-10-11 | Disposition: A | Discharge status: Home / Self Care | Payer: Medicaid Other | Source: Ambulatory Visit | Attending: Emergency Medicine | Admitting: Emergency Medicine

## 2012-10-11 ENCOUNTER — Inpatient Hospital Stay (HOSPITAL_COMMUNITY): Payer: Medicaid Other | Admitting: Anesthesiology

## 2012-10-11 ENCOUNTER — Encounter (HOSPITAL_COMMUNITY): Payer: Self-pay | Admitting: *Deleted

## 2012-10-11 ENCOUNTER — Other Ambulatory Visit: Payer: Self-pay | Admitting: Advanced Practice Midwife

## 2012-10-11 ENCOUNTER — Encounter (HOSPITAL_COMMUNITY): Payer: Self-pay | Admitting: Anesthesiology

## 2012-10-11 ENCOUNTER — Inpatient Hospital Stay (HOSPITAL_COMMUNITY): Payer: Medicaid Other

## 2012-10-11 DIAGNOSIS — Z8639 Personal history of other endocrine, nutritional and metabolic disease: Secondary | ICD-10-CM | POA: Insufficient documentation

## 2012-10-11 DIAGNOSIS — E11649 Type 2 diabetes mellitus with hypoglycemia without coma: Secondary | ICD-10-CM

## 2012-10-11 DIAGNOSIS — I2510 Atherosclerotic heart disease of native coronary artery without angina pectoris: Secondary | ICD-10-CM | POA: Insufficient documentation

## 2012-10-11 DIAGNOSIS — Z8719 Personal history of other diseases of the digestive system: Secondary | ICD-10-CM | POA: Insufficient documentation

## 2012-10-11 DIAGNOSIS — Z862 Personal history of diseases of the blood and blood-forming organs and certain disorders involving the immune mechanism: Secondary | ICD-10-CM | POA: Insufficient documentation

## 2012-10-11 DIAGNOSIS — I10 Essential (primary) hypertension: Secondary | ICD-10-CM | POA: Insufficient documentation

## 2012-10-11 DIAGNOSIS — E785 Hyperlipidemia, unspecified: Secondary | ICD-10-CM | POA: Insufficient documentation

## 2012-10-11 DIAGNOSIS — E162 Hypoglycemia, unspecified: Secondary | ICD-10-CM

## 2012-10-11 DIAGNOSIS — E1169 Type 2 diabetes mellitus with other specified complication: Secondary | ICD-10-CM | POA: Insufficient documentation

## 2012-10-11 DIAGNOSIS — Z87891 Personal history of nicotine dependence: Secondary | ICD-10-CM | POA: Insufficient documentation

## 2012-10-11 DIAGNOSIS — Z8709 Personal history of other diseases of the respiratory system: Secondary | ICD-10-CM | POA: Insufficient documentation

## 2012-10-11 DIAGNOSIS — Z8619 Personal history of other infectious and parasitic diseases: Secondary | ICD-10-CM | POA: Insufficient documentation

## 2012-10-11 LAB — GLUCOSE, CAPILLARY
Glucose-Capillary: 150 mg/dL — ABNORMAL HIGH (ref 70–99)
Glucose-Capillary: 21 mg/dL — CL (ref 70–99)

## 2012-10-11 MED ORDER — GLUCAGON HCL (RDNA) 1 MG IJ SOLR
INTRAMUSCULAR | Status: AC
  Filled 2012-10-11: qty 1

## 2012-10-11 MED ORDER — GLUCAGON HCL (RDNA) 1 MG IJ SOLR: 0.5000 mg | Freq: Once | INTRAMUSCULAR | Status: AC | PRN

## 2012-10-11 MED ORDER — GLUCOSE 40 % PO GEL
ORAL | Status: AC
  Administered 2012-10-11: 11:00:00
  Filled 2012-10-11: qty 0.83

## 2012-10-11 MED ORDER — GLUCOSE 40 % PO GEL
1.0000 | Freq: Once | ORAL | Status: DC
  Filled 2012-10-11: qty 0.83

## 2012-10-11 MED ORDER — DEXTROSE 5 % IN LACTATED RINGERS IV BOLUS
1000.0000 mL | Freq: Once | INTRAVENOUS | Status: AC
  Administered 2012-10-11: 1000 mL via INTRAVENOUS

## 2012-10-11 MED ORDER — GLUCAGON HCL (RDNA) 1 MG IJ SOLR: 1.0000 mg | Freq: Once | INTRAMUSCULAR | Status: DC | PRN

## 2012-10-11 MED ORDER — GLUCAGON HCL (RDNA) 1 MG IJ SOLR
1.0000 mg | Freq: Once | INTRAMUSCULAR | Status: AC | PRN
  Administered 2012-10-11: 1 mg via INTRAMUSCULAR
  Filled 2012-10-11: qty 1

## 2012-10-11 MED ORDER — GLUCAGON HCL (RDNA) 1 MG IJ SOLR
INTRAMUSCULAR | Status: AC
  Administered 2012-10-11: 1 mg
  Filled 2012-10-11: qty 1

## 2012-10-11 NOTE — Progress Notes (Signed)
Late Entry  Called to MAU a second time to suction pt.  Arrived to find pt sitting in chair with decreased level of consciousness than when I had prior assessed her. I did not feel that she needed to be suctioned at that time. Called RN to room.  RR was 10, SaO2 was 100% and pt was responsive but very lethargic.  RN and I moved her to the bed and notified the Nurse Practitioner who subsequently called the Rapid Response Team.  Remained in room as support until EMS arrived to transport to [facility].

## 2012-10-11 NOTE — MAU Note (Signed)
Pt brought up from clinic, was there for routine visit, became choked & was coughing, pt has trach & states she needs to be suctioned.

## 2012-10-11 NOTE — MAU Note (Signed)
[facility] here to transport pt.  IV started, D5LR infusing.  Pt now alert & talking, understands she is being transferred to [facility].

## 2012-10-11 NOTE — MAU Note (Signed)
Pt sitting in chair, no apparent distress, pt insists that she needs to be suctioned again, states she feels like her blood sugar is low, does not want to eat until she is suctioned.  Informed pt I will call RT to come see her, also informed pt she needs to call for her transportation.  Pt refuses, states she still needs to be suctioned.  Pt has her chart & labels, refuses to return her chart, is putting labels on her purse.  NP informed of situation.

## 2012-10-11 NOTE — ED Notes (Signed)
Per EMS: pt was at [facility] having pap smear when she felt like her blood sugar was getting low; pt had bowl of cereal; pt CBG read 21 at women[facility]; before EMS arrived pts CBG back up to 150

## 2012-10-11 NOTE — ED Notes (Signed)
Respiratory at bedside cleaning and suctioning pts trach; pt refused to have labs drawn; Dr Radford Pax notified that pt refuses to have labs drawn; Dr Radford Pax states to given pt something to eat

## 2012-10-11 NOTE — Progress Notes (Signed)
Late Entry  Called to MAU regarding the patient was brought up from Clinic complaining of needing to be suctioned.  Pt was anxious and was talking and moving around the room with her trach open to ambient air and coughing into a tissue then subsequently into the trash can.  I convinced her to return to the bed and began to set up for sterile suction technique.  She was grabbing at my gloves and sterile catheter and would not let me insert the catheter but wanted to do it herself.  I attempted to get sterile gloves on her but to no avail.  She attempted to suction herself as she does at home.  She consistently complained that she needed more suctioning.  After several attempts I told her to rest in order to give her airway a rest.  She seemed to be breathing fine and her airway was clear but she was antsy and anxious.  She then removed her inner cannula and wiped it with her kleenex.  I attempted to take it from her in order to reinsert it but she would not let me have it.  I asked the RN and Nurse Practiioner to re-evalute her.  I explained to them what actions that I had taken and asked them to call me if they needed any further assistance.  Clenton Pare ,RRT, NPS, RCP

## 2012-10-11 NOTE — Progress Notes (Signed)
Pt came for appt scheduled for pap smear 10/11/12.  Pt had a mask covering face I asked pt what mask was for and she stated to me "I was coughing a lot so they gave me a mask".  As I am checking in pt, pt starts coughing continously and starts having copious amounts of fluid from her trach.  I asked pt did she have suction supplies and she shook her head no.  I informed pt that we will need to take her MAU for suctioning.  Pt said "thank you".  As I am rolling pt to MAU with the assistance of Linda, I asked the pt if she was feeling ok.  Pt stated "no".  I asked her how long has she been feeling bad and I held my index finger for the number 1 and pt shook her head that she has been not feeling well x 1 day.  We get to MAU and due to her continous coughing we were asked by admission receptionist to go ahead to the back.  As I am giving report pt states "no I don't feel bad I just need suctioning."  Finished report with nurse and pt remained on MAU for care.

## 2012-10-11 NOTE — ED Notes (Signed)
Respiratory called per DR Beaton's order to have pt suctioned
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
## 2012-10-11 NOTE — MAU Note (Signed)
Pt more responsive, talking, second dose of IM glucagen given.  Dr Rodman Pickle continues to attempt IV start.

## 2012-10-11 NOTE — MAU Provider Note (Signed)
Attestation of Attending Supervision of Advanced Practitioner (CNM/NP): Evaluation and management procedures were performed by the Advanced Practitioner under my supervision and collaboration.  I have reviewed the Advanced Practitioner's note and chart, and I agree with the management and plan.  HARRAWAY-SMITH, Prapti Grussing 1:52 PM

## 2012-10-11 NOTE — MAU Note (Signed)
Pt is A&O x 3, states she has throat pain, respirations even & unlabored, coughing intermittently, white sputum noted at trach.

## 2012-10-11 NOTE — ED Notes (Signed)
Pt taken to waiting room via wheelchair and placed near window in the lobby; pt verifies she will be able to get to the bus stop without help and repeatly states she does not want to be taken to the bus stop and knows when the buses run; pt states she wants to wait in lobby for bus to come; pt has been given bus passes; nurse first notified of pt in lobby waiting for bus

## 2012-10-11 NOTE — MAU Note (Signed)
When RT came to suction pt, pt had lowered LOC, responsive to vocal & tactile stimuli.  Assisted to bed, CBG 19.  RR RN in room, also C. MiKowski RN.  Dr. Rodman Pickle now in room, several unsuccessful IV attempts.  Attempted to give pt glucose gel, pt would not swallow.  IM Glucagen given.  CBG now 21.  Pt becoming combative with IV attempts.  Lab here for blood.  BP 120/102, HR 129, O2 sat 100%.  Pt very agitated.

## 2012-10-11 NOTE — ED Notes (Signed)
Asked pt repeatedly to call a ride; pt states her mother is at work and can not come get her; pt states she has no one else that she can call for a ride home; pt states she can take the bus home if she can get a bus pass; charge nurse notified of the situation and states if pt is stable and cbg stable pt should be fine to take the bus; rechecking pts vitals and CBG at this time and will notify MD of the situation

## 2012-10-11 NOTE — ED Notes (Signed)
Verified with DR Radford Pax pt stable for d/c at this time; Dr Radford Pax aware of pts CBG of 247 and vital signs; Dr Radford Pax aware pt taking the bus home and states she is stable to take bus home at this time.

## 2012-10-11 NOTE — Anesthesia Preprocedure Evaluation (Signed)
Anesthesia Evaluation   Patient unresponsive    Reviewed: Unable to perform ROS - Chart review only  Airway      Comment: trach Dental   Pulmonary former smoker,  Trach H/o TB         Cardiovascular     Neuro/Psych Seizures - (focal seizure activity in progress),  PSYCHIATRIC DISORDERS    GI/Hepatic GERD-  ,(+)     substance abuse  alcohol use and cocaine use, esophagitis   Endo/Other  diabetes, Type 1Blood sugar 11 on my arrival to MAU - has received IM glucose   Renal/GU   negative genitourinary   Musculoskeletal   Abdominal   Peds  Hematology  (+) anemia ,   Anesthesia Other Findings   Reproductive/Obstetrics                           Anesthesia Physical Anesthesia Plan  ASA: IV and emergent  Anesthesia Plan:    Post-op Pain Management:    Induction:   Airway Management Planned:   Additional Equipment:   Intra-op Plan:   Post-operative Plan:   Informed Consent:   Plan Discussed with:   Anesthesia Plan Comments: (Consulted for IV access - tenuous 22 ga IV obtained in left arm after multiple attempts (sclerotic veins with extensive track marks throughout).  Recommended transfer to [facility] or [facility] ED via [facility].  Suggested NGT for gastric administration of glucose - but after two doses of IM glucose patient became responsive, alert and appropriate and stabilized.  CBG came up to 150 by the time [facility] arrived to transport.  Jasmine December, MD)        Anesthesia Quick Evaluation

## 2012-10-11 NOTE — ED Notes (Signed)
Pt alert and mentating appropriately upon d/c teaching; pt given d/c teaching and follow up care instructions; pt verbalizes understanding of d/c teaching and follow up care; pt has no further questions upon d/c. Spoke with charge nurse, Efraim Kaufmann and verified security should be called to have pt taken to bus stop if pt wants them to; pt states she can walk to the bus stop by herself and does not need help to the bus stop

## 2012-10-14 ENCOUNTER — Telehealth: Payer: Self-pay | Admitting: Dietician

## 2012-10-14 ENCOUNTER — Telehealth: Payer: Self-pay | Admitting: Licensed Clinical Social Worker

## 2012-10-14 ENCOUNTER — Other Ambulatory Visit: Payer: Self-pay | Admitting: *Deleted

## 2012-10-14 NOTE — Telephone Encounter (Signed)
[name] has appt on 4/1 at 8:15am.  Pt has canceled appt's in the past due to lack of transportation.  Pt is set up with [employer] for medical appt's.  CSW placed call to [name] and left reminder of appt date and time and the phone number to [facility] to [employer] transportation.  CSW has requested [facility] care manager to review pt's chart for additional assistance/resource available to provide [name].

## 2012-10-14 NOTE — Telephone Encounter (Signed)
Left message over weekend that she needed refills on glucose tablets, iron, antibiotics, pravastatin and chem strips. Discussed with triage nurse who was going to call patient. Patient has appointment in am with [name].

## 2012-10-15 ENCOUNTER — Emergency Department (HOSPITAL_COMMUNITY)
Admission: EM | Admit: 2012-10-15 | Discharge: 2012-10-15 | Disposition: A | Discharge status: Home / Self Care | Payer: Medicaid Other | Source: Home / Self Care | Attending: Emergency Medicine | Admitting: Emergency Medicine

## 2012-10-15 ENCOUNTER — Encounter (HOSPITAL_COMMUNITY): Payer: Self-pay | Admitting: Emergency Medicine

## 2012-10-15 ENCOUNTER — Emergency Department (HOSPITAL_COMMUNITY)
Admission: EM | Admit: 2012-10-15 | Discharge: 2012-10-16 | Disposition: A | Discharge status: Home / Self Care | Payer: Medicaid Other | Attending: Emergency Medicine | Admitting: Emergency Medicine

## 2012-10-15 ENCOUNTER — Encounter (HOSPITAL_COMMUNITY): Payer: Self-pay | Admitting: *Deleted

## 2012-10-15 ENCOUNTER — Encounter: Payer: Self-pay | Admitting: Licensed Clinical Social Worker

## 2012-10-15 ENCOUNTER — Other Ambulatory Visit: Payer: Self-pay | Admitting: *Deleted

## 2012-10-15 ENCOUNTER — Ambulatory Visit (INDEPENDENT_AMBULATORY_CARE_PROVIDER_SITE_OTHER): Payer: Medicaid Other | Admitting: Internal Medicine

## 2012-10-15 ENCOUNTER — Encounter: Payer: Self-pay | Admitting: Dietician

## 2012-10-15 ENCOUNTER — Other Ambulatory Visit: Payer: Self-pay | Admitting: Internal Medicine

## 2012-10-15 DIAGNOSIS — I10 Essential (primary) hypertension: Secondary | ICD-10-CM | POA: Insufficient documentation

## 2012-10-15 DIAGNOSIS — Z8659 Personal history of other mental and behavioral disorders: Secondary | ICD-10-CM | POA: Insufficient documentation

## 2012-10-15 DIAGNOSIS — Z79899 Other long term (current) drug therapy: Secondary | ICD-10-CM | POA: Insufficient documentation

## 2012-10-15 DIAGNOSIS — Z794 Long term (current) use of insulin: Secondary | ICD-10-CM | POA: Insufficient documentation

## 2012-10-15 DIAGNOSIS — E162 Hypoglycemia, unspecified: Secondary | ICD-10-CM | POA: Insufficient documentation

## 2012-10-15 DIAGNOSIS — R5381 Other malaise: Secondary | ICD-10-CM | POA: Insufficient documentation

## 2012-10-15 DIAGNOSIS — E1065 Type 1 diabetes mellitus with hyperglycemia: Secondary | ICD-10-CM

## 2012-10-15 DIAGNOSIS — Z8639 Personal history of other endocrine, nutritional and metabolic disease: Secondary | ICD-10-CM | POA: Insufficient documentation

## 2012-10-15 DIAGNOSIS — Z8611 Personal history of tuberculosis: Secondary | ICD-10-CM | POA: Insufficient documentation

## 2012-10-15 DIAGNOSIS — Z8619 Personal history of other infectious and parasitic diseases: Secondary | ICD-10-CM | POA: Insufficient documentation

## 2012-10-15 DIAGNOSIS — I2510 Atherosclerotic heart disease of native coronary artery without angina pectoris: Secondary | ICD-10-CM | POA: Insufficient documentation

## 2012-10-15 DIAGNOSIS — R4182 Altered mental status, unspecified: Secondary | ICD-10-CM | POA: Insufficient documentation

## 2012-10-15 DIAGNOSIS — Z87891 Personal history of nicotine dependence: Secondary | ICD-10-CM | POA: Insufficient documentation

## 2012-10-15 DIAGNOSIS — Z862 Personal history of diseases of the blood and blood-forming organs and certain disorders involving the immune mechanism: Secondary | ICD-10-CM | POA: Insufficient documentation

## 2012-10-15 DIAGNOSIS — Z930 Tracheostomy status: Secondary | ICD-10-CM | POA: Insufficient documentation

## 2012-10-15 DIAGNOSIS — E109 Type 1 diabetes mellitus without complications: Secondary | ICD-10-CM | POA: Insufficient documentation

## 2012-10-15 DIAGNOSIS — E1169 Type 2 diabetes mellitus with other specified complication: Secondary | ICD-10-CM | POA: Insufficient documentation

## 2012-10-15 DIAGNOSIS — E785 Hyperlipidemia, unspecified: Secondary | ICD-10-CM | POA: Insufficient documentation

## 2012-10-15 DIAGNOSIS — Z8719 Personal history of other diseases of the digestive system: Secondary | ICD-10-CM | POA: Insufficient documentation

## 2012-10-15 DIAGNOSIS — R4789 Other speech disturbances: Secondary | ICD-10-CM | POA: Insufficient documentation

## 2012-10-15 DIAGNOSIS — F1010 Alcohol abuse, uncomplicated: Secondary | ICD-10-CM | POA: Insufficient documentation

## 2012-10-15 DIAGNOSIS — R5383 Other fatigue: Secondary | ICD-10-CM | POA: Insufficient documentation

## 2012-10-15 DIAGNOSIS — F1410 Cocaine abuse, uncomplicated: Secondary | ICD-10-CM | POA: Insufficient documentation

## 2012-10-15 LAB — GLUCOSE, CAPILLARY
Glucose-Capillary: 25 mg/dL — CL (ref 70–99)
Glucose-Capillary: 38 mg/dL — CL (ref 70–99)
Glucose-Capillary: 56 mg/dL — ABNORMAL LOW (ref 70–99)
Glucose-Capillary: 104 mg/dL — ABNORMAL HIGH (ref 70–99)
Glucose-Capillary: 187 mg/dL — ABNORMAL HIGH (ref 70–99)

## 2012-10-15 MED ORDER — INSULIN GLARGINE 100 UNIT/ML ~~LOC~~ SOLN: 6.0000 [IU] | Freq: Every day | SUBCUTANEOUS | Status: DC

## 2012-10-15 NOTE — Progress Notes (Signed)
8:45AM Pt taken to triage desk via w/c per Dr Rogelia Boga. CBG before leaving IMC - 38 after taking few sips OJ. Skin warm and dry. Responding to questions. Stanton Kidney Jermarcus Mcfadyen RN 10/15/12 9:15AM

## 2012-10-15 NOTE — Telephone Encounter (Signed)
Eye drop from optho

## 2012-10-15 NOTE — Patient Instructions (Addendum)
Do Not take any Humalog.   Take ONLY LANTUS 6 units each night.   You are no longer on Pravastatin.  You are not on an antibiotic.   I will ask for a refill on your eye drops and lantus  Mental Health phone #: [phone]

## 2012-10-15 NOTE — ED Notes (Signed)
Pt taken to restroom to urinate.

## 2012-10-15 NOTE — ED Notes (Signed)
Pt given orange juice, [place] sandwich and crackers. Pt eating at this moment.

## 2012-10-15 NOTE — ED Notes (Signed)
Per EMS: pt coming from home with c/o hypoglycemia. Family found pt unresponsive on cough. Upon EMS arrival pt's cbg 23, pt glucagon IM, pt took three glucose tabs, pt's cbg 95 upon arrival to ED. Pt is A&Ox4, skin warm and dry, respirations equal and unlabored. Pt also has a trach
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
## 2012-10-15 NOTE — Progress Notes (Signed)
Patient ID: [name], female   DOB: 18-Sep-1965, 47 y.o.   MRN: [id number] [facility] note, [place] RN: I called & left a message for [name] concerning her appt. tomorrow. I gave her information about [facility], as well & have been informed per [facility] that she has been enrolled w/ their services. I don't know if her insulin dosage needs to be readjusted or not - she informed me last week that her blood sugar had dropped too low. Maybe it would be a good idea to have [facility] to go out & re-instruct pt. on suctioning technique, etc. I know her trach size was decreased from size #6 to #4 @ a recent ED visit - she was to order new trach supplies. I was also informed, that a nurse from [facility] was out to complete an assessment for personal care services - but she told them, she was capable of performing her own bathing & dressing tasks (which would disqualify her from getting that type of assistance). She demonstrated to me that she is capable of performing those tasks for herself (during my home visit on 09/16/12). I wasn't aware that she'd cancelled 2 appts. I would be available for a conference the week of April 14th.

## 2012-10-15 NOTE — Discharge Instructions (Signed)
 Hypoglycemia (Low Blood Sugar) Hypoglycemia is when the glucose (sugar) in your blood is too low. Hypoglycemia can happen for many reasons. It can happen to people with or without diabetes. Hypoglycemia can develop quickly and can be a medical emergency.  CAUSES  Having hypoglycemia does not mean that you will develop diabetes. Different causes include:  Missed or delayed meals or not enough carbohydrates eaten.  Medication overdose. This could be by accident or deliberate. If by accident, your medication may need to be adjusted or changed.  Exercise or increased activity without adjustments in carbohydrates or medications.  A nerve disorder that affects body functions like your heart rate, blood pressure and digestion (autonomic neuropathy).  A condition where the stomach muscles do not function properly (gastroparesis). Therefore, medications may not absorb properly.  The inability to recognize the signs of hypoglycemia (hypoglycemic unawareness).  Absorption of insulin   may be altered.  Alcohol consumption.  Pregnancy/menstrual cycles/postpartum. This may be due to hormones.  Certain kinds of tumors. This is very rare. SYMPTOMS   Sweating.  Hunger.  Dizziness.  Blurred vision.  Drowsiness.  Weakness.  Headache.  Rapid heart beat.  Shakiness.  Nervousness. DIAGNOSIS  Diagnosis is made by monitoring blood glucose in one or all of the following ways:  Fingerstick blood glucose monitoring.  Laboratory results. TREATMENT  If you think your blood glucose is low:  Check your blood glucose, if possible. If it is less than 70 mg/dl, take one of the following:  3-4 glucose tablets.   cup juice (prefer clear like apple).   cup regular soda pop.  1 cup milk.  -1 tube of glucose gel.  5-6 hard candies.  Do not over treat because your blood glucose (sugar) will only go too high.  Wait 15 minutes and recheck your blood glucose. If it is still less than  70 mg/dl (or below your target range), repeat treatment.  Eat a snack if it is more than one hour until your next meal. Sometimes, your blood glucose may go so low that you are unable to treat yourself. You may need someone to help you. You may even pass out or be unable to swallow. This may require you to get an injection of glucagon , which raises the blood glucose. HOME CARE INSTRUCTIONS  Check blood glucose as recommended by your caregiver.  Take medication as prescribed by your caregiver.  Follow your meal plan. Do not skip meals. Eat on time.  If you are going to drink alcohol, drink it only with meals.  Check your blood glucose before driving.  Check your blood glucose before and after exercise. If you exercise longer or different than usual, be sure to check blood glucose more frequently.  Always carry treatment with you. Glucose tablets are the easiest to carry.  Always wear medical alert jewelry or carry some form of identification that states that you have diabetes. This will alert people that you have diabetes. If you have hypoglycemia, they will have a better idea on what to do. SEEK MEDICAL CARE IF:   You are having problems keeping your blood sugar at target range.  You are having frequent episodes of hypoglycemia.  You feel you might be having side effects from your medicines.  You have symptoms of an illness that is not improving after 3-4 days.  You notice a change in vision or a new problem with your vision. SEEK IMMEDIATE MEDICAL CARE IF:   You are a family member or friend of a  person whose blood glucose goes below 70 mg/dl and is accompanied by:  Confusion.  A change in mental status.  The inability to swallow.  Passing out. Document Released: 07/03/2005 Document Revised: 09/25/2011 Document Reviewed: 10/30/2011 [facility] Patient Information 2013 [place], [place].    DISCONTINUE YOUR HUMULOG INSULIN .  TAKE 6 UNITS OF LANTUS  AT BEDTIME DAILY STARTING  TONIGHT.  PLEASE GO TO CLINIC FOR COMPLETION OF YOUR EVALUATION AT 1 PM TODAY.

## 2012-10-15 NOTE — Progress Notes (Signed)
Patient ID: Kindred Heying, female   DOB: March 21, 1966, 47 y.o.   MRN: [id number]  Saw patient on 10/15/12 after being discharged from ER.  Patient concerned with getting antibiotic for cough,  other medications, transportation papers completed. Patient reports she took Humalog this am and ate. Was eating and coughing prior to visit, but voice clear. Showed me her logbook that was hard to read,patient reports : Lantus dose  8-10 units each night and Humalog 5 units each meal. Patient reports that her mother, daughter and caregiver friend help her at home with drawing up her insulin.   Coughing today at visit after eating crackers. Patient had no difficulty with self monitoring today at visit: blood sugar 183 mg/dl. Educated patient to stop Humalog and continue with 6 units Lantus per Dr. Donnelly Stager instructions.   CDE tried to call patient's mother to discuss care at home and discontinuation of Humalog, left message for her to return call.   Follow up with patient on 10/16/12 and discussed with social work. May help patient to avoid hypoglycemia if she checks blood blood sugar more often, carries and ingests easily chewed and swallowed form of glucose at all times. Could consider swallow evaluation.

## 2012-10-15 NOTE — ED Notes (Signed)
Pt reports she took her insulin this morning and then at some toast, egg and cereal for breakfast. Pt went to PCP and was told she had low blood sugar. Pt is alert and oriented X 4, pt in nad, speaking in full sentences, skin warm and dry, resp e/u.

## 2012-10-15 NOTE — ED Notes (Signed)
Pt denies any complaints at this time.  CBG 187 at present.  Pt d/c from ED earlier today.

## 2012-10-15 NOTE — Progress Notes (Signed)
Pt has a #4 shiley cuffless. IC cleaned at this time. No complications noted. RT will monitor.

## 2012-10-15 NOTE — ED Notes (Signed)
Pt given coca-cola and milk to drink. Pt still working on eating sandwich.

## 2012-10-15 NOTE — Telephone Encounter (Signed)
review 

## 2012-10-15 NOTE — ED Notes (Signed)
Pt brought up from [facility] for hypoglycemia; pt trying to talk on phone during triage and has trach in place; pt CBG 48

## 2012-10-15 NOTE — ED Notes (Signed)
Diet order placed for breakfast.

## 2012-10-15 NOTE — ED Notes (Signed)
Pt given [place] sandwich and graham crackers with peanut butter.

## 2012-10-15 NOTE — Assessment & Plan Note (Signed)
Pt presented with symptomatic hypoglycemia. Was in ER 3/28 with CBG of 11. Sent to ER. Arranging multi-dis conference to discuss her care as she is challenging.

## 2012-10-16 ENCOUNTER — Telehealth: Payer: Self-pay | Admitting: Licensed Clinical Social Worker

## 2012-10-16 ENCOUNTER — Telehealth: Payer: Self-pay | Admitting: Dietician

## 2012-10-16 LAB — CBC WITH DIFFERENTIAL/PLATELET
Basophils Absolute: 0 10*3/uL (ref 0.0–0.1)
Basophils Relative: 0 % (ref 0–1)
Eosinophils Absolute: 0 10*3/uL (ref 0.0–0.7)
HCT: 38.1 % (ref 36.0–46.0)
Hemoglobin: 12.5 g/dL (ref 12.0–15.0)
MCH: 20.4 pg — ABNORMAL LOW (ref 26.0–34.0)
MCHC: 32.8 g/dL (ref 30.0–36.0)
Monocytes Absolute: 0.3 10*3/uL (ref 0.1–1.0)
Monocytes Relative: 4 % (ref 3–12)
Neutrophils Relative %: 78 % — ABNORMAL HIGH (ref 43–77)
RDW: 15.6 % — ABNORMAL HIGH (ref 11.5–15.5)

## 2012-10-16 LAB — BASIC METABOLIC PANEL
BUN: 10 mg/dL (ref 6–23)
Creatinine, Ser: 0.59 mg/dL (ref 0.50–1.10)
GFR calc Af Amer: >90 mL/min (ref >90)
GFR calc non Af Amer: >90 mL/min (ref >90)

## 2012-10-16 NOTE — Progress Notes (Signed)
Called for suction for trach patient. Patient had a #4 Shiley cuffless trach with a Passey-Muir valve. Suction with a 10 fr catheter yielded a moderate amount of thick pale yellow sputum. Removed and cleaned inner cannula. Pulse 119, RR 24, Sat 99% on room air. Breath sounds were clear post suction. Patient tolerated procedure well. No adverse reactions. Patient is being discharged home.

## 2012-10-16 NOTE — Telephone Encounter (Signed)
Called patient to follow up on Blood sugars and increased self monitoring: CBG 300 right now per patient, took 6 units lantus last night. She verbalized understanding to not taking any Humalog insulin  today. Says she sometimes draws up her insulin herself, but has someone check her dose before she gives it due to limited eyesight. Verbalized comprehension to increased self monitoring ~ every 2 hours today. Ate Chicken drumstick, Toast, eggs and applesauce for breakfast,  says food gets caught in trach, but is cleaning it 3 times a day. Voice clear today.

## 2012-10-16 NOTE — Telephone Encounter (Signed)
[name] has had 2 ED visit for hypoglycemia within the last two weeks.  Pt is linked with [facility] for care management.  Case discussed with [facility] and PCP, pt would benefit from [facility] RN for additional Trach and DM education and assessment.  CSW placed call to [name] to discuss current barriers and referral for home health.  Pt states "I am a diabetic and only take 6 units of Lantus.  No other medicine." Pt aware and in agreement for referral for [facility] RN.  Pt has used [facility] and is agreeable to [facility] to [facility].  Pt states she is aware of her appt on 4/3 and will call and schedule transportation.  [name] made aware CSW will be in office on 4/3 should pt needs add'l resources.  [facility] aware and has planned to follow up with pt.

## 2012-10-16 NOTE — Progress Notes (Signed)
Thank you for F/U. shana arranging multi-dis conference
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
## 2012-10-17 ENCOUNTER — Encounter: Payer: Self-pay | Admitting: Internal Medicine

## 2012-10-17 ENCOUNTER — Ambulatory Visit (INDEPENDENT_AMBULATORY_CARE_PROVIDER_SITE_OTHER): Payer: Medicaid Other | Admitting: Internal Medicine

## 2012-10-17 ENCOUNTER — Telehealth: Payer: Self-pay | Admitting: Dietician

## 2012-10-17 ENCOUNTER — Other Ambulatory Visit: Payer: Self-pay | Admitting: Internal Medicine

## 2012-10-17 VITALS — BP 131/77 | HR 70 | Temp 98.9°F | Ht 61.0 in | Wt 107.0 lb

## 2012-10-17 DIAGNOSIS — IMO0002 Reserved for concepts with insufficient information to code with codable children: Secondary | ICD-10-CM

## 2012-10-17 DIAGNOSIS — E109 Type 1 diabetes mellitus without complications: Secondary | ICD-10-CM

## 2012-10-17 DIAGNOSIS — E1065 Type 1 diabetes mellitus with hyperglycemia: Secondary | ICD-10-CM

## 2012-10-17 DIAGNOSIS — Z930 Tracheostomy status: Secondary | ICD-10-CM

## 2012-10-17 MED ORDER — GLUCOSE 4 G PO CHEW: 16.0000 g | CHEWABLE_TABLET | ORAL | Status: DC | PRN

## 2012-10-17 MED ORDER — INSULIN GLARGINE 100 UNIT/ML ~~LOC~~ SOLN: 8.0000 [IU] | Freq: Every day | SUBCUTANEOUS | Status: DC

## 2012-10-17 MED ORDER — "INSULIN SYRINGE 31G X 5/16"" 0.3 ML MISC": Status: DC

## 2012-10-17 NOTE — Assessment & Plan Note (Addendum)
Pertinent Labs: Lab Results  Component Value Date   HGBA1C 10.2* 08/20/2012   HGBA1C 8.4 03/19/2012   CREATININE 0.59 10/16/2012   CREATININE 0.78 03/19/2012   MICROALBUR 2.23* 01/09/2012   MICRALBCREAT 9.8 01/09/2012   CHOL 202* 03/19/2012   HDL 71 03/19/2012   TRIG 122 03/19/2012    Assessment: Disease Control:  uncontrolled  Progress toward goals:  unchanged   Barriers to meeting goals: lack of understanding   Patient's diabetes has been historically very difficult to control with very frequent hypo-and hyperglycemic episodes. She is a type I diabetic, insulin-dependent, she has had frequent DKA episodes when off of insulin or not taking it appropriately. Alternatively, within the past few days she has had severe hypoglycemia - to 11 mg/dL. Therefore, at this time, the most prudent way to approach it is likely to keep her off of her mealtime insulin as she is currently, with slight escalation of her basal insulin to allow for her hyperglycemia throughout the day as indicated on her glucometer log.   Plan: Glucometer log was reviewed today, as pt did have glucometer available for review.   Increase Lantus to 8 units.  reminded to bring blood glucose meter & log to each visit  Continue to hold Humalog.  Hypoglycemia instructions reviewed  Glucose tablets prescription sent to her pharmacy  Educational resources provided:   handout regarding management of hypoglycemia  Self management tools provided:    Home glucose monitoring recommendation:     before each meal and after each meal and if feeling poorly.
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
## 2012-10-17 NOTE — Assessment & Plan Note (Signed)
Assessment: Visit today expanded > 2 hours with approximately 25 minutes of face to face encounter with physician. Patient immediately took out her trach, started having more difficulty breathing, wanted immediately trach care/ suctioning per herself and nurse. We required calling respiratory therapy for assistance, and provided complete trach care.  RT recommendations as follows: Has a #4 uncuffed trach Needs 110F suction catheter tray Sterile water or sterile saline for trach cleaning Trach tube holder Trach cleaning kit Room with suction equipment RT number [phone] for additional assistance.  Plan:      Extensive trach care provided today.  Patient already has daily nursing aide as well as 4-5 time daily trach care per herself and family members.   Dondra Spry will contact the patient's home health nurse to ensure she is appropriate regimen in place with suctioning of secretions.  Will need very regular followup visits initially because we will be unable to provide several hours of support at each clinic visit for her chronic issue. I am hoping that by having regular follow-up she will not need such high level care at each visit and will have improved chronic health of this issue.

## 2012-10-17 NOTE — Telephone Encounter (Signed)
Called Demeisha's mother, Corrie Dandy, per Maryrose's request to let her know that she(Michaelann) is still here. [place]le to  [place] who reports that she works at night (woke her up with call) and doesn't get home until 4am, sleeps during day.  She says she had not been checking behind Adilyn for accuracy of insulin dose. Mary asked about Andromeda's condition and mental health. Informed her Vici is feeling better than when she came in this am and we are trying to stabilize her blood sugars. Told Mary that Defne's new insulin dose is 8 Units lantus daily. Mary agreed to check Lantus insulin dose before Tawn injects to be sure it is 8 units.

## 2012-10-17 NOTE — Patient Instructions (Addendum)
General Instructions:  Please follow-up at the clinic in 1-2 weeks, at which time we will reevaluate diabetes - OR, please follow-up in the clinic sooner if needed.  There have been changes in your medications:  INCREASE your Lantus to 8 units daily  STOP Humalog    Keep checking your blood sugar before and after each meal.  If you are diabetic, please bring your meter to your next visit.  If you have low blood sugars, please treat it immediately, and go to the ER.  If symptoms worsen, or new symptoms arise, please call the clinic or go to the ER.  PLEASE BRING ALL OF YOUR MEDICATIONS  IN A BAG TO YOUR NEXT APPOINTMENT  Hypoglycemia (Low Blood Sugar) Hypoglycemia is when the glucose (sugar) in your blood is too low. Hypoglycemia can happen for many reasons. It can happen to people with or without diabetes. Hypoglycemia can develop quickly and can be a medical emergency.    CAUSES   Having hypoglycemia does not mean that you will develop diabetes. Different causes include: Missed or delayed meals or not enough carbohydrates eaten.  Medication overdose. This could be by accident or deliberate. If by accident, your medication may need to be adjusted or changed.  Exercise or increased activity without adjustments in carbohydrates or medications.  A nerve disorder that affects body functions like your heart rate, blood pressure and digestion (autonomic neuropathy).  A condition where the stomach muscles do not function properly (gastroparesis). Therefore, medications may not absorb properly.  The inability to recognize the signs of hypoglycemia (hypoglycemic unawareness).  Absorption of insulin - may be altered.  Alcohol consumption.  Pregnancy/menstrual cycles/postpartum. This may be due to hormones.  Certain kinds of tumors. This is very rare.   SYMPTOMS   Sweating.  Hunger.  Dizziness.  Blurred vision.  Drowsiness.  Weakness.  Headache.  Rapid heart beat.  Shakiness.   Nervousness.   DIAGNOSIS   Diagnosis is made by monitoring blood glucose in one or all of the following ways: Fingerstick blood glucose monitoring.  Laboratory results.   TREATMENT   If you think your blood glucose is low: Check your blood glucose, if possible. If it is less than 70 mg/dl, take one of the following:  3-4 glucose tablets.   cup juice (prefer clear like apple).   cup "regular" soda pop.  1 cup milk.  -1 tube of glucose gel.  5-6 hard candies.  Do not over treat because your blood glucose (sugar) will only go too high.  Wait 15 minutes and recheck your blood glucose. If it is still less than 70 mg/dl (or below your target range), repeat treatment.  Eat a snack if it is more than one hour until your next meal.  Sometimes, your blood glucose may go so low that you are unable to treat yourself. You may need someone to help you. You may even pass out or be unable to swallow. This may require you to get an injection of glucagon, which raises the blood glucose.  HOME CARE INSTRUCTIONS Check blood glucose as recommended by your caregiver.  Take medication as prescribed by your caregiver.  Follow your meal plan. Do not skip meals. Eat on time.  If you are going to drink alcohol, drink it only with meals.  Check your blood glucose before driving.  Check your blood glucose before and after exercise. If you exercise longer or different than usual, be sure to check blood glucose more frequently.  Always carry  treatment with you. Glucose tablets are the easiest to carry.  Always wear medical alert jewelry or carry some form of identification that states that you have diabetes. This will alert people that you have diabetes. If you have hypoglycemia, they will have a better idea on what to do.   SEEK MEDICAL CARE IF:   You are having problems keeping your blood sugar at target range.  You are having frequent episodes of hypoglycemia.  You feel you might be having side effects  from your medicines.  You have symptoms of an illness that is not improving after 3-4 days.  You notice a change in vision or a new problem with your vision.   SEEK IMMEDIATE MEDICAL CARE IF:   You are a family member or friend of a person whose blood glucose goes below 70 mg/dl and is accompanied by:  Confusion.  A change in mental status.  The inability to swallow.  Passing out.  Document Released: 07/03/2005 Document Revised: 06/22/2011 Document Reviewed: 02/25/2009 [facility] Patient Information 2012 [place], [place].   Treatment Goals:  Goals (1 Years of Data) as of 10/17/12         As of Today 10/16/12 10/15/12 10/15/12 10/15/12     Blood Pressure    . Blood Pressure < 140/90  131/77 136/89 128/72 133/75 118/75     Result Component    . HEMOGLOBIN A1C < 8.5          . LDL CALC < 100            Progress Toward Treatment Goals:  Treatment Goal 09/10/2012  Hemoglobin A1C deteriorated  Other  unable to assess    Self Care Goals & Plans:  Self Care Goal 09/10/2012  Manage my medications bring my medications to every visit    Home Blood Glucose Monitoring 09/10/2012  Check my blood sugar 4 times a day  When to check my blood sugar before breakfast; before lunch; before dinner; at bedtime     Care Management & Community Referrals:  Referral 09/10/2012  Referrals made for care management support social worker; diabetes educator; nutritionist  Referrals made to community resources falls prevention

## 2012-10-18 ENCOUNTER — Encounter: Payer: Self-pay | Admitting: Dietician

## 2012-10-18 ENCOUNTER — Encounter: Payer: Self-pay | Admitting: Internal Medicine

## 2012-10-18 NOTE — Progress Notes (Signed)
Multidis conference set up for Monday the 14th. [name] will consider getting ethics input. I will review her eating / choking issues and if needed talk with ENT

## 2012-10-18 NOTE — Progress Notes (Signed)
From [facility] When I was out on my initial home visit w/ pt. on 09/16/12 - pt. only showed me insulin that had been filled in February 2014. Also. during the initial home visit - pt's mother & boyfriend said they assist pt. w/ her care when needed. However, it did seem as though pt. is not very receptive to anyone telling her what to do or trying to give too much input into how she does things. However, I will schedule an appt. for a home visit - in order to re-assess Dr. Donnelly Stager concerns. I will schedule to go out & see if she has any additional insulin - she may not have showed me. I will also plan to talk w/ her mother & inquire about the level of care that she provides. Pt's mother & her boyfriend stated, they are willing to assist pt. when needed; during initial home visit. I was also able to gather - pt. is not very receptive to being told what to do nor to anyone intervening too much into her care. I will also plan to talk w/ her mother if possible - she is somewhat limited in what she will disclose. But she did tell me that she helps w/ pt's care when needed

## 2012-10-18 NOTE — Progress Notes (Signed)
Patient ID: Katessa Attridge, female   DOB: Mar 24, 1966, 47 y.o.   MRN: [id number] After yesterday's visit in clinic. Patient called CDE to go to short stay to get papers to fax to transportation to verify that she was here.   When I got there she also asked me to help her call transportation or her mother because she could not remember who brought her here this am. While making phone calls, she started acting unusual- had quiet,motionless spells where she did not respond to her name, leaning back a bit in her wheelchair and closing her eyes, then she'd become alert and said she felt a bit low.She checked and her sugar was 78. While treating her low/eating she had several of these spells. Took glucose tablets (cannot chew well) so she drank milk. Had coughing spells with food/liquid intake. Blood sugar recheck was 76 (? Minutes later).   CDE informed Dr.Kalia-Reynolds about blood sugar levels. Also,  concerned about Karlin's weight loss: 10 # in 5 weeks; (8-9 % UBW) at risk for malnutrition. Weight loss could be due  to not eating well due to trach issues and blood sugar control.

## 2012-10-19 ENCOUNTER — Emergency Department (HOSPITAL_COMMUNITY)
Admission: EM | Admit: 2012-10-19 | Discharge: 2012-10-19 | Disposition: A | Discharge status: Home / Self Care | Payer: Medicaid Other | Attending: Emergency Medicine | Admitting: Emergency Medicine

## 2012-10-19 ENCOUNTER — Encounter (HOSPITAL_COMMUNITY): Payer: Self-pay | Admitting: Physical Medicine and Rehabilitation

## 2012-10-19 DIAGNOSIS — Z8639 Personal history of other endocrine, nutritional and metabolic disease: Secondary | ICD-10-CM | POA: Insufficient documentation

## 2012-10-19 DIAGNOSIS — Z8719 Personal history of other diseases of the digestive system: Secondary | ICD-10-CM | POA: Insufficient documentation

## 2012-10-19 DIAGNOSIS — Z79899 Other long term (current) drug therapy: Secondary | ICD-10-CM | POA: Insufficient documentation

## 2012-10-19 DIAGNOSIS — I10 Essential (primary) hypertension: Secondary | ICD-10-CM | POA: Insufficient documentation

## 2012-10-19 DIAGNOSIS — Z8619 Personal history of other infectious and parasitic diseases: Secondary | ICD-10-CM | POA: Insufficient documentation

## 2012-10-19 DIAGNOSIS — E785 Hyperlipidemia, unspecified: Secondary | ICD-10-CM | POA: Insufficient documentation

## 2012-10-19 DIAGNOSIS — Z794 Long term (current) use of insulin: Secondary | ICD-10-CM | POA: Insufficient documentation

## 2012-10-19 DIAGNOSIS — Z87891 Personal history of nicotine dependence: Secondary | ICD-10-CM | POA: Insufficient documentation

## 2012-10-19 DIAGNOSIS — Z862 Personal history of diseases of the blood and blood-forming organs and certain disorders involving the immune mechanism: Secondary | ICD-10-CM | POA: Insufficient documentation

## 2012-10-19 DIAGNOSIS — I2510 Atherosclerotic heart disease of native coronary artery without angina pectoris: Secondary | ICD-10-CM | POA: Insufficient documentation

## 2012-10-19 DIAGNOSIS — E162 Hypoglycemia, unspecified: Secondary | ICD-10-CM

## 2012-10-19 DIAGNOSIS — E1169 Type 2 diabetes mellitus with other specified complication: Secondary | ICD-10-CM | POA: Insufficient documentation

## 2012-10-19 LAB — CBC WITH DIFFERENTIAL/PLATELET
Basophils Absolute: 0 10*3/uL (ref 0.0–0.1)
Eosinophils Absolute: 0.1 10*3/uL (ref 0.0–0.7)
Eosinophils Relative: 1 % (ref 0–5)
Lymphocytes Relative: 28 % (ref 12–46)
MCH: 20.6 pg — ABNORMAL LOW (ref 26.0–34.0)
MCV: 61.9 fL — ABNORMAL LOW (ref 78.0–100.0)
Platelets: 250 10*3/uL (ref 150–400)
RDW: 15.8 % — ABNORMAL HIGH (ref 11.5–15.5)
WBC: 5.7 10*3/uL (ref 4.0–10.5)

## 2012-10-19 LAB — BASIC METABOLIC PANEL
Calcium: 9.5 mg/dL (ref 8.4–10.5)
GFR calc non Af Amer: >90 mL/min (ref >90)
Sodium: 137 mEq/L (ref 135–145)

## 2012-10-19 MED ORDER — GLUCOSE 40 % PO GEL
1.0000 | Freq: Once | ORAL | Status: AC
  Administered 2012-10-19: 37.5 g via ORAL
  Filled 2012-10-19: qty 1

## 2012-10-19 NOTE — ED Notes (Signed)
Pt resting quietly at the time. Vital signs stable. Talking on phone with friend. Denies pain. She remains alert and oriented x4.
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
## 2012-10-19 NOTE — ED Notes (Signed)
Unable to establish peripheral IV access, Dr. Preston Fleeting notified. To hold on peripheral IV at the time per EDP. Pt encouraged to continue eating oral glucose gel. Will monitor closely. Vital signs stable.

## 2012-10-19 NOTE — ED Notes (Signed)
EDP and RN at bedside. Diabetes education performed. Pt discharged home, has no further questions at the time.

## 2012-10-19 NOTE — ED Notes (Signed)
Pt up to bathroom without difficulty. Pt resting quietly at the time. Vital signs stable. Denies pain. Pt is alert and oriented x4.

## 2012-10-19 NOTE — ED Notes (Signed)
Pt is alert, sitting up in bed eating snack without difficulty. Significant other at the bedside. Vital signs stable. Will check CBG after eating.

## 2012-10-19 NOTE — ED Notes (Addendum)
Pt presents to department via [facility] for evaluation of hypoglycemia. History of non compliance with eating, initial CBG was 16, EMS states she was only responsive to pain at that time. Received 1mg  Glucagon IM per EMS. Upon arrival pt is alert and able to nod to simple questions. NSR on monitor. Pt has tracheostomy.

## 2012-10-21 ENCOUNTER — Telehealth: Payer: Self-pay | Admitting: Dietician

## 2012-10-21 ENCOUNTER — Telehealth: Payer: Self-pay | Admitting: Licensed Clinical Social Worker

## 2012-10-21 NOTE — Telephone Encounter (Signed)
Asked to communicate lowered Lantus insulin dose to 6 units daily to [facility] per [name]. There was no answer. Left message for patient communicating this new dose. Requested call back for confirmation

## 2012-10-21 NOTE — Progress Notes (Signed)
Erroneous encounter-disregard
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
## 2012-10-22 ENCOUNTER — Telehealth: Payer: Self-pay | Admitting: Dietician

## 2012-10-22 ENCOUNTER — Other Ambulatory Visit: Payer: Self-pay | Admitting: Licensed Clinical Social Worker

## 2012-10-22 DIAGNOSIS — Z930 Tracheostomy status: Secondary | ICD-10-CM
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
## 2012-10-23 ENCOUNTER — Observation Stay (HOSPITAL_COMMUNITY)
Admission: EM | Admit: 2012-10-23 | Discharge: 2012-10-24 | Disposition: A | Discharge status: Home / Self Care | Payer: Medicaid Other | Attending: Internal Medicine | Admitting: Internal Medicine

## 2012-10-23 ENCOUNTER — Encounter (HOSPITAL_COMMUNITY): Payer: Self-pay | Admitting: Nurse Practitioner

## 2012-10-23 ENCOUNTER — Telehealth: Payer: Self-pay | Admitting: Licensed Clinical Social Worker

## 2012-10-23 DIAGNOSIS — E876 Hypokalemia: Secondary | ICD-10-CM | POA: Diagnosis present

## 2012-10-23 DIAGNOSIS — F489 Nonpsychotic mental disorder, unspecified: Secondary | ICD-10-CM

## 2012-10-23 DIAGNOSIS — Z91199 Patient's noncompliance with other medical treatment and regimen due to unspecified reason: Secondary | ICD-10-CM | POA: Insufficient documentation

## 2012-10-23 DIAGNOSIS — Z5900 Homelessness unspecified: Secondary | ICD-10-CM | POA: Insufficient documentation

## 2012-10-23 DIAGNOSIS — E039 Hypothyroidism, unspecified: Secondary | ICD-10-CM | POA: Insufficient documentation

## 2012-10-23 DIAGNOSIS — E162 Hypoglycemia, unspecified: Secondary | ICD-10-CM

## 2012-10-23 DIAGNOSIS — Z794 Long term (current) use of insulin: Secondary | ICD-10-CM | POA: Insufficient documentation

## 2012-10-23 DIAGNOSIS — T38801A Poisoning by unspecified hormones and synthetic substitutes, accidental (unintentional), initial encounter: Secondary | ICD-10-CM | POA: Insufficient documentation

## 2012-10-23 DIAGNOSIS — E1069 Type 1 diabetes mellitus with other specified complication: Secondary | ICD-10-CM | POA: Insufficient documentation

## 2012-10-23 DIAGNOSIS — E1065 Type 1 diabetes mellitus with hyperglycemia: Secondary | ICD-10-CM | POA: Diagnosis present

## 2012-10-23 DIAGNOSIS — D509 Iron deficiency anemia, unspecified: Secondary | ICD-10-CM

## 2012-10-23 DIAGNOSIS — J9610 Chronic respiratory failure, unspecified whether with hypoxia or hypercapnia: Secondary | ICD-10-CM | POA: Insufficient documentation

## 2012-10-23 DIAGNOSIS — E871 Hypo-osmolality and hyponatremia: Secondary | ICD-10-CM | POA: Insufficient documentation

## 2012-10-23 DIAGNOSIS — Z9119 Patient's noncompliance with other medical treatment and regimen: Secondary | ICD-10-CM | POA: Insufficient documentation

## 2012-10-23 DIAGNOSIS — T383X1A Poisoning by insulin and oral hypoglycemic [antidiabetic] drugs, accidental (unintentional), initial encounter: Principal | ICD-10-CM | POA: Insufficient documentation

## 2012-10-23 DIAGNOSIS — F1910 Other psychoactive substance abuse, uncomplicated: Secondary | ICD-10-CM

## 2012-10-23 DIAGNOSIS — IMO0002 Reserved for concepts with insufficient information to code with codable children: Secondary | ICD-10-CM | POA: Diagnosis present

## 2012-10-23 DIAGNOSIS — I10 Essential (primary) hypertension: Secondary | ICD-10-CM | POA: Insufficient documentation

## 2012-10-23 DIAGNOSIS — Z930 Tracheostomy status: Secondary | ICD-10-CM

## 2012-10-23 DIAGNOSIS — F99 Mental disorder, not otherwise specified: Secondary | ICD-10-CM

## 2012-10-23 LAB — GLUCOSE, CAPILLARY
Glucose-Capillary: 71 mg/dL (ref 70–99)
Glucose-Capillary: 73 mg/dL (ref 70–99)

## 2012-10-23 LAB — CBC WITH DIFFERENTIAL/PLATELET
Basophils Absolute: 0 10*3/uL (ref 0.0–0.1)
Basophils Relative: 0 % (ref 0–1)
Eosinophils Absolute: 0.1 10*3/uL (ref 0.0–0.7)
HCT: 37.5 % (ref 36.0–46.0)
Lymphocytes Relative: 32 % (ref 12–46)
Lymphs Abs: 1.7 10*3/uL (ref 0.7–4.0)
MCH: 20.3 pg — ABNORMAL LOW (ref 26.0–34.0)
MCHC: 32.8 g/dL (ref 30.0–36.0)
Monocytes Absolute: 0.4 10*3/uL (ref 0.1–1.0)
Neutro Abs: 3.1 10*3/uL (ref 1.7–7.7)
RDW: 15.6 % — ABNORMAL HIGH (ref 11.5–15.5)

## 2012-10-23 LAB — BASIC METABOLIC PANEL
BUN: 11 mg/dL (ref 6–23)
Chloride: 98 mEq/L (ref 96–112)
Creatinine, Ser: 0.63 mg/dL (ref 0.50–1.10)
GFR calc Af Amer: >90 mL/min (ref >90)
Glucose, Bld: 84 mg/dL (ref 70–99)

## 2012-10-23 MED ORDER — DEXTROSE-NACL 5-0.9 % IV SOLN
Freq: Once | INTRAVENOUS | Status: AC
  Administered 2012-10-23: 21:00:00 via INTRAVENOUS
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
## 2012-10-23 NOTE — ED Notes (Signed)
Pt's CBG 118

## 2012-10-23 NOTE — ED Notes (Signed)
Dr. Bebe Shaggy made aware of CBG 170. Per wickline D5NS paused.

## 2012-10-23 NOTE — ED Notes (Signed)
[name] (mother) [phone] to be called upon discharge for pt. Ride

## 2012-10-23 NOTE — ED Notes (Signed)
Pt's CBG is 73

## 2012-10-23 NOTE — ED Notes (Signed)
Pt asked about ability to swallow or dysphagia hx. Pt denies. Pt requesting chocolate milk. Pt given milk HOB sat up 90 degrees.

## 2012-10-23 NOTE — Telephone Encounter (Signed)
Call the hospital main number [phone] and ask to speak to the IM resident on call

## 2012-10-23 NOTE — ED Notes (Signed)
Per EMS [place] bus station CBG 15 pt given 1mg  glucagon IM. Unsuccessful IV attempt x4 enroute pt become responsive to pain and speaking incoherently. Last CBG 55
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
## 2012-10-24 ENCOUNTER — Encounter: Payer: Self-pay | Admitting: Licensed Clinical Social Worker

## 2012-10-24 ENCOUNTER — Telehealth: Payer: Self-pay | Admitting: Dietician

## 2012-10-24 ENCOUNTER — Encounter (HOSPITAL_COMMUNITY): Payer: Self-pay | Admitting: Internal Medicine

## 2012-10-24 DIAGNOSIS — E876 Hypokalemia: Secondary | ICD-10-CM | POA: Diagnosis present

## 2012-10-24 LAB — HEPATIC FUNCTION PANEL
Bilirubin, Direct: <0.1 mg/dL (ref 0.0–0.3)
Total Protein: 8.1 g/dL (ref 6.0–8.3)

## 2012-10-24 LAB — CBC
MCHC: 33 g/dL (ref 30.0–36.0)
Platelets: 210 10*3/uL (ref 150–400)
RDW: 15.6 % — ABNORMAL HIGH (ref 11.5–15.5)
WBC: 7.1 10*3/uL (ref 4.0–10.5)

## 2012-10-24 LAB — BASIC METABOLIC PANEL
Chloride: 99 mEq/L (ref 96–112)
GFR calc Af Amer: >90 mL/min (ref >90)
GFR calc non Af Amer: >90 mL/min (ref >90)
Potassium: 4 mEq/L (ref 3.5–5.1)
Sodium: 132 mEq/L — ABNORMAL LOW (ref 135–145)

## 2012-10-24 LAB — GLUCOSE, CAPILLARY
Glucose-Capillary: 23 mg/dL — CL (ref 70–99)
Glucose-Capillary: 37 mg/dL — CL (ref 70–99)
Glucose-Capillary: 78 mg/dL (ref 70–99)
Glucose-Capillary: 97 mg/dL (ref 70–99)
Glucose-Capillary: 97 mg/dL (ref 70–99)

## 2012-10-24 LAB — INSULIN, RANDOM: Insulin: 3 u[IU]/mL (ref 3–28)

## 2012-10-24 LAB — TSH: TSH: 5.939 u[IU]/mL — ABNORMAL HIGH (ref 0.350–4.500)

## 2012-10-24 LAB — C-PEPTIDE: C-Peptide: <0.1 ng/mL — ABNORMAL LOW (ref 0.80–3.90)

## 2012-10-24 LAB — PHOSPHORUS: Phosphorus: 3.3 mg/dL (ref 2.3–4.6)

## 2012-10-24 MED ORDER — GLUCAGON HCL (RDNA) 1 MG IJ SOLR
INTRAMUSCULAR | Status: AC
  Administered 2012-10-24: 1 mg
  Filled 2012-10-24: qty 1

## 2012-10-24 MED ORDER — SODIUM CHLORIDE 0.9 % IJ SOLN: 3.0000 mL | INTRAMUSCULAR | Status: DC | PRN

## 2012-10-24 MED ORDER — SODIUM CHLORIDE 0.9 % IV SOLN: 250.0000 mL | INTRAVENOUS | Status: DC | PRN

## 2012-10-24 MED ORDER — DEXTROSE 5 % IV SOLN: INTRAVENOUS | Status: DC

## 2012-10-24 MED ORDER — PHENOL 1.4 % MT LIQD
1.0000 | OROMUCOSAL | Status: DC | PRN
  Filled 2012-10-24: qty 177

## 2012-10-24 MED ORDER — POTASSIUM CHLORIDE 20 MEQ/15ML (10%) PO LIQD
40.0000 meq | Freq: Once | ORAL | Status: DC
  Filled 2012-10-24: qty 30

## 2012-10-24 MED ORDER — DEXTROSE 50 % IV SOLN
INTRAVENOUS | Status: AC
  Administered 2012-10-24: 50 mL via INTRAVENOUS
  Filled 2012-10-24: qty 50

## 2012-10-24 MED ORDER — DEXTROSE 50 % IV SOLN
25.0000 mL | Freq: Once | INTRAVENOUS | Status: DC | PRN
  Filled 2012-10-24: qty 50

## 2012-10-24 MED ORDER — SODIUM CHLORIDE 0.9 % IJ SOLN: 3.0000 mL | Freq: Two times a day (BID) | INTRAMUSCULAR | Status: DC

## 2012-10-24 MED ORDER — ALBUTEROL SULFATE (5 MG/ML) 0.5% IN NEBU: 2.5000 mg | INHALATION_SOLUTION | RESPIRATORY_TRACT | Status: DC | PRN

## 2012-10-24 MED ORDER — ACETAMINOPHEN 325 MG PO TABS
650.0000 mg | ORAL_TABLET | Freq: Four times a day (QID) | ORAL | Status: DC | PRN
  Administered 2012-10-24: 650 mg via ORAL
  Filled 2012-10-24: qty 2

## 2012-10-24 MED ORDER — ALBUTEROL SULFATE HFA 108 (90 BASE) MCG/ACT IN AERS: 2.0000 | INHALATION_SPRAY | RESPIRATORY_TRACT | Status: DC | PRN

## 2012-10-24 MED ORDER — INSULIN GLARGINE 100 UNIT/ML ~~LOC~~ SOLN: 5.0000 [IU] | Freq: Every day | SUBCUTANEOUS | Status: DC

## 2012-10-24 MED ORDER — DEXTROSE 50 % IV SOLN: 25.0000 mL | Freq: Once | INTRAVENOUS | Status: DC | PRN

## 2012-10-24 MED ORDER — DIVALPROEX SODIUM 125 MG PO DR TAB
125.0000 mg | DELAYED_RELEASE_TABLET | Freq: Every day | ORAL | Status: DC
  Administered 2012-10-24: 125 mg via ORAL
  Filled 2012-10-24: qty 1

## 2012-10-24 MED ORDER — HEPARIN SODIUM (PORCINE) 5000 UNIT/ML IJ SOLN
5000.0000 [IU] | Freq: Three times a day (TID) | INTRAMUSCULAR | Status: DC
  Filled 2012-10-24 (×3): qty 1

## 2012-10-24 MED ORDER — GLUCOSE 40 % PO GEL
1.0000 | ORAL | Status: DC | PRN
  Administered 2012-10-24: 37.5 g via ORAL
  Filled 2012-10-24: qty 1
  Filled 2012-10-24: qty 2

## 2012-10-24 MED ORDER — DEXTROSE 50 % IV SOLN: 50.0000 mL | Freq: Once | INTRAVENOUS | Status: AC

## 2012-10-24 NOTE — ED Notes (Signed)
Pt experienced brief episode of choking at 0050 on sandwich. Pt states does normally eat solid foods at home. Pt maintained SpO2 100% during episode. Dr. Bebe Shaggy at bedside. Pt able to cough up food. Mouth and Trach suctioned. Respirations even and unlabored at this time.

## 2012-10-24 NOTE — Telephone Encounter (Signed)
[name] called from hospital room and put her caregiver, [name], on the phone. He says he tries to take care of her, and when she is sick he stays home from work.   He is aware of her new Lantus insulin dose of 5 units and that she is not to take any other types of insulin. He also said they are planning to get their own place. Gave him the office and my office extent ion for questions or concerns.

## 2012-10-24 NOTE — Significant Event (Addendum)
Rapid Response Event Note  Overview: Called to assist with patient with low blood sugar - CBG 18 and no IV access Time Called: 1153 Arrival Time: 1156 Event Type: Other (Comment) (hypoglcemia)  Initial Focused Assessment:  On arrival patient supine in bed - arouses but confused - comabative at times - MAE x 4 - trach (chronic) patent with PMV present resps reg and unlabored - skin warm and moist - speaks - will follow occ command - BP 100/58.  Continues to ask for pocketbook.     Interventions:  Staff attempted oral gel prior to calling RRT - patient spitting it out - gave staff order over phone to give Glucagon 1 mg IM per protocol - given on my arrival.  Patient has poor veins per staff and had a foot IV that was d/c'd earlier as patient was to be d/c'd home.  Attempt x2 for IV site - veins fragile and blowing - stat call to IV team for assistance.  Patient becoming more alert and co-op - able to get glucose gel - one vial - into patient.  Patient states she does not know what happened - denies self medicating.   Becoming more alert = taking food - swallowing well.  CBG's increasing - see results flowsheet for details.   Lowest recording 18.  Patient now alert - co-op.  Dr. Virgina Organ present - speaking with patient and patients Mom per telephone.  IV team present - unable to start IV - Dr. Virgina Organ states ok to d/c trying IV.   Staff to continue to monitor.    Event Summary: Name of Physician Notified: Dr. Virgina Organ at 1205    at    Outcome: Stayed in room and stabalized  Event End Time: 1255  [place]
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
## 2012-10-24 NOTE — Progress Notes (Signed)
Internal Medicine Teaching Service Interim Note:  Called by RN that [name]' husband, [name], is in the room to pick up [name] who is ready for discharge.  [name] insists on leaving with [name] despite prior arrangement for mother to pick her up.  I spoke to [name] on the phone, he relays living at mother [name]'s house but that they are trying to get their own place.  He said he will monitor her insulin and only allow 5 units of lantus to be given at 10am in the morning after my explanation.  He said he understood and relayed orders back to me.  [name] again insisted on only wanting to leave with him at this time and they both wanted to leave now to catch the busy.  RN spoke to mother, [name], on the phone who said it did not matter who picks her up since she is not POA but said that she does not really have a husband.    At this time, based on [name] ready for discharge and wanting to leave with her said husband, insulin instructions were given to him on the phone and they will proceed with discharge both acknowledging return to [facility] Monday morning for appointment.

## 2012-10-24 NOTE — Telephone Encounter (Signed)
CSW placed call to Ms. Fournet to discuss upcoming care coordination meeting.  CSW reassured Ms. Heinemann purpose of the meeting is to help pt to be as independent as possible in the community.  The goal is to make sure pt has access to resources available.  CSW discussed with Ms. Mcmasters the opportunity for pt to receive one-one training from Respiratory therapy after Family conference.  Pt will have opportunity to display knowledge of trach care and ask questions.  Pt aware of home health referral for trach care/diabetes management.  [facility] has not made contact today.  CSW placed call to to [facility], pt is on schedule for 4/10.  CSW requested [facility] RN to notify pt of date and time of assessment. CSW will pass email to Jimmie Molly., [facility] Rep to notify [facility] RN of care coordination meeting on 4/14 to ensure pt receives consistent information.

## 2012-10-24 NOTE — H&P (Signed)
Internal Medicine Attending Admission Note Date: 10/24/2012  Patient name: [name] Medical record number: [id number] Date of birth: 04-04-1966 Age: 47 y.o. Gender: female  I saw and evaluated the patient. I reviewed the resident's note and I agree with the resident's findings and plan as documented in the resident's note.  Chief Complaint(s): Hypoglycemia  History - key components related to admission: 47 year old female with past medical history of bipolar disorder, chronic tracheostomy and type 1 diabetes with multiple ER visits for hypoglycemia presents yet again with an episode of hypoglycemia with a CBG of about 15 after she injected herself 14 units of Lantus(instead of 6 units recommended by [place]). The shunt has had several such episodes where she injects herself excessive amount of Lantus and ends up in the hospital for hypoglycemia.  Patient was given glucagon and dextrose with her CBG coming up to 170.  15 point review of system is negative except what is noted above.  Past medical history, past surgical history, medications, family history and social history was reviewed and is as per resident's note.   Physical Exam - key components related to admission:  Filed Vitals:   10/24/12 0251 10/24/12 0442 10/24/12 0555 10/24/12 0754  BP:   100/58   Pulse: 77 80 68 87  Temp:   98.1 F (36.7 C)   TempSrc:   Oral   Resp: 20 19 20 18   SpO2: 96% 98% 100% 100%  Physical Exam: General: Vital signs reviewed and noted. Well-developed, well-nourished, in no acute distress; alert, appropriate and cooperative throughout examination.  Head: Normocephalic, atraumatic.  Eyes: PERRL, EOMI, No signs of anemia or jaundince.  Nose: Mucous membranes moist, not inflammed, nonerythematous.  Throat: Oropharynx nonerythematous, no exudate appreciated.   Neck: No deformities, masses, or tenderness noted.Supple, No carotid Bruits, no JVD.  Lungs:  Normal respiratory effort. Clear to auscultation BL  without crackles or wheezes.  Heart: RRR. S1 and S2 normal without gallop, murmur, or rubs.  Abdomen:  BS normoactive. Soft, Nondistended, non-tender.  No masses or organomegaly.  Extremities: No pretibial edema.  Neurologic: A&O X3, CN II - XII are grossly intact. Motor strength is 5/5 in the all 4 extremities, Sensations intact to light touch, Cerebellar signs negative.  Skin: No visible rashes, scars.    Lab results:   Basic Metabolic Panel:  Recent Labs  91/47/82 1857 10/24/12 0222 10/24/12 0256  NA 133* 132*  --   K 3.2* 4.0  --   CL 98 99  --   CO2 26 24  --   GLUCOSE 84 157*  --   BUN 11 11  --   CREATININE 0.63 0.62  --   CALCIUM 9.4 9.3  --   MG  --   --  2.1  PHOS  --   --  3.3   Liver Function Tests:  Recent Labs  10/24/12 0256  AST 20  ALT 9  ALKPHOS 56  BILITOT 0.3  PROT 8.1  ALBUMIN 3.5   No results found for this basename: LIPASE, AMYLASE,  in the last 72 hours No results found for this basename: AMMONIA,  in the last 72 hours CBC:  Recent Labs  10/23/12 1857 10/24/12 0222  WBC 5.3 7.1  NEUTROABS 3.1  --   HGB 12.3 11.2*  HCT 37.5 33.9*  MCV 61.9* 61.9*  PLT 211 210   CBG:  Recent Labs  10/23/12 1833 10/23/12 2030 10/23/12 2209 10/23/12 2313 10/24/12 0036 10/24/12 0746  GLUCAP 71 170* 118*  73 37* 97   Imaging results:  No results found.  Other results: EKG:  Assessment & Plan by Problem:  Principal Problem:   Hypoglycemia Active Problems:   Diabetes mellitus type 1, uncontrolled, insulin dependent   Tracheostomy dependence   Hypokalemia   Hyponatremia   Patient is a 47 year old female with past medical history as noted above who was admitted with yet another episode of hypoglycemia induced by inappropriate use of insulin. Patient is currently on 6 units of Lantus daily. Although it has been noted that patient is insulin-dependent but given several episodes of hypoglycemia it is very difficult to manage her type 1  diabetes with insulin. It has been indicated multiple times on several previous admissions but patient has not been compliant with the recommendations. It has been clearly documented with multiple previous admissions for life-threatening hypoglycemia that patient is incapable of administering her medications in a safe way.   I seriously doubt of her ability to care for herself at this time although she lives with her daughter and boyfriend. Patient seems medically stable to be discharged at this time but would need some type of assisted living facility/family members to assure that she is able to dispense medications to herself in a safe way. We will consult social worker to find an aid or such facility so that she does not have to inject herself insulin.  I suggest following instructions for a safe discharge: -Patient should not be given any access to insulin(except once per day) as she is clearly trying to harm herself vs incapable of administering the insulin safely(we will call her mom or caregiver to make sure that she gets only recommended doses of insulin) -We have a multidisciplinary clinic meeting coming up on Monday to decide a safe plan for this patient to continue her care. -If we are able to ensure the above, patient can be discharged to home  Lars Mage MD Faculty-Internal Medicine Residency Program

## 2012-10-24 NOTE — Progress Notes (Signed)
Visit to patient with CSW, [name], while she is in hospital. Gave patient 3 packs of 30 cc syringes, glucose tablets and educated patient about calling the resident on call after hours by calling the hospital operator. Also advised her not to  predraw Lantus, and reiterated new order for  5 units lantus once daily (per patient she will take at 10:00 AM) and not to take any other insulin. Advised her to eat when blood sugar is low and to drink water when blood sugar is high. Team may want to make specific guidelines about when she should call for high blood sugars.  Will call after she is discharged to assist with transition of care.
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
## 2012-10-24 NOTE — Progress Notes (Signed)
Subjective: Ms. Mentzel was seen and examined at bedside this morning.  She is awake, ready to eat breakfast, and claims to be feeling much better.  She showed me her CBG log where she records daily and how much insulin she gives.  At the top of her insulin page, it says 8 units of lantus daily.  i explained to her that her lantus was decreased to 6 units and she says yes she knows that, which is why she gave her self 5 units the other day based on her sugar. She also briefly mentioned having sliding scale novolog at home but then said she does not take that.  I then discussed that lantus is long acting and she does not need to base it on her CBGs and adjust her dose herself.  She should ONLY be taking it once a day as prescribed by her PCP, which was 6 units lately.    Of note, she has an appointment in [facility]--multidisciplinary conference with family members, pcp, and social work to try to help Ms. Tobin.    She currently denies any chest pain, shortness of breath, N/V/D, abdominal pain, dysuria, headache, fever, or chills at this time.  She denies any thoughts of self harm, suicidal or homicidal ideation.  She appears cheerful and thankful for her care.   Objective: Vital signs in last 24 hours: Filed Vitals:   10/24/12 0251 10/24/12 0442 10/24/12 0555 10/24/12 0754  BP:   100/58   Pulse: 77 80 68 87  Temp:   98.1 F (36.7 C)   TempSrc:   Oral   Resp: 20 19 20 18   SpO2: 96% 98% 100% 100%   Weight change:   Intake/Output Summary (Last 24 hours) at 10/24/12 0903 Last data filed at 10/24/12 0841  Gross per 24 hour  Intake      0 ml  Output    350 ml  Net   -350 ml   Vitals reviewed. General: resting in bed, NAD HEENT: PERRL, EOMI, no scleral icterus Neck: Trach in place Cardiac: RRR, no rubs, murmurs or gallops Pulm: B/L rhonchi  Abd: soft, nontender, nondistended, BS present Ext: warm and well perfused, no pedal edema, moving all 4 extremities Neuro: alert and oriented X3, cranial  nerves II-XII grossly intact, strength and sensation to light touch equal in bilateral upper and lower extremities  Lab Results: Basic Metabolic Panel:  Recent Labs Lab 10/23/12 1857 10/24/12 0222 10/24/12 0256  NA 133* 132*  --   K 3.2* 4.0  --   CL 98 99  --   CO2 26 24  --   GLUCOSE 84 157*  --   BUN 11 11  --   CREATININE 0.63 0.62  --   CALCIUM 9.4 9.3  --   MG  --   --  2.1  PHOS  --   --  3.3   Liver Function Tests:  Recent Labs Lab 10/24/12 0256  AST 20  ALT 9  ALKPHOS 56  BILITOT 0.3  PROT 8.1  ALBUMIN 3.5   CBC:  Recent Labs Lab 10/19/12 0841 10/23/12 1857 10/24/12 0222  WBC 5.7 5.3 7.1  NEUTROABS 3.8 3.1  --   HGB 13.5 12.3 11.2*  HCT 40.5 37.5 33.9*  MCV 61.9* 61.9* 61.9*  PLT 250 211 210   CBG:  Recent Labs Lab 10/23/12 1833 10/23/12 2030 10/23/12 2209 10/23/12 2313 10/24/12 0036 10/24/12 0746  GLUCAP 71 170* 118* 73 37* 97   Urine Drug  Screen: Drugs of Abuse     Component Value Date/Time   LABOPIA NONE DETECTED 08/09/2012 1314   COCAINSCRNUR NONE DETECTED 08/09/2012 1314   LABBENZ NONE DETECTED 08/09/2012 1314   AMPHETMU NONE DETECTED 08/09/2012 1314   THCU NONE DETECTED 08/09/2012 1314   LABBARB NONE DETECTED 08/09/2012 1314    Medications: I have reviewed the patient's current medications. Scheduled Meds: . dextrose   Intravenous STAT  . divalproex  125 mg Oral Daily  . heparin  5,000 Units Subcutaneous Q8H  . potassium chloride  40 mEq Oral Once  . sodium chloride  3 mL Intravenous Q12H   Continuous Infusions: . dextrose     PRN Meds:.sodium chloride, acetaminophen, albuterol, albuterol, dextrose, dextrose, dextrose, phenol, sodium chloride Assessment/Plan: 47 y.o with VERY poorly controlled type 1 DM admitted for hypoglycemia (CBG 15 found unresponsive) with history of multiple episodes of hypoglycemia as well.   1. Hypoglycemia--likely secondary to misuse of Lantus.  She claims she took 14 units on day of admission  instead of the 6 units ONLY that she should be taking.  She denies allcohol intake (patient denies intake) or recent drug use.  No signs of ongoing infection (afebrile and no leukocytosis).  Possible contribution could also be from adrenal insufficiency, decreased oral intake, post prandial hypoglycemia.  LFTs wnl.  It is clear that she has VERY poor insight into her management despite multiple attempts from multiple providers to assist her with diabetic management and glucose control.  She appears competent, makes decisions for herself, is alert and oriented, and denies any suicidal or homicidal ideation.  The fear is that if such episodes and poor control continues, she will eventually end up dying from this disease and her poor management.  This has been re-iterated to her MULTIPLE times and her PCP has now arranged for a multidisciplinary conference to discuss her in complete detail along with family members.  The hope is that she will be able to take control and better manage her diabetes, maintaining herself appropriately out of the hospital.   -f/u tsh, insulin level, C peptid, cortisol level, urine to r/o infection -touched based with Norm Parcel, opc diabetic educator who will go see Ms. Cueva for education and re-iteration of management instructions again.  She has a multidisciplinary conference in [facility] set up for Monday 4/14.   -hypoglycemic protocol  -Consider SNF/THN in long run due to multiple admissions for same problem. Currently patient lives at home with daughter and boyfriend and mother as primary care taker. Patient states she will consider SNF.  This will likely be addressed on Monday with her conference with PCP and social work.    2. Diabetes mellitus type 1, uncontrolled, insulin dependent  -HA1C 10.2 08/2012  -Home dose of Lantus supposed to be 6 units qd per last documentation  -monitoring CBGs -tolerating diet with no complaints -will discharge home on 5 UNITS OF LANTUS ONLY and  will provide 30cc syringes by diabetic educator along with more counseling prior to discharge.  After speaking to [facility] and patient, the concern is she may be still using 100cc syringes at home which makes measuring 6 units difficult, thus with the 30cc syringes, 5 cc mark is clearly marked and should be easier to measure out.  Ms. Binion also informed me of having sliding scale novolog at home, however, she says she does not use it.  This needs to be discarded.  I also explained to her in detail that she should only give  herself insuline ONCE daily and then no need to administer anymore.  She acknowledges complete understanding of new instructions and was able to relay it back to me.    3. Tracheostomy dependent-chronic respiratory failure  -trach care -prn Albuterol inhaler and nebulizer   4. F/E/N  -NSL -Hypokalemia-K 3.2 on admission replaced with K solution 40 meQ x 1; increased to 4 on 4/10 -Hyponatremia-asymptomatic, given IVF. Will monitor BMET  -Tolerating heart healthy diet though if concerned change diet to nectar thick liquid per SLP in 05/2012 recommendation.   5. DVT px  -Heparin sq   Dispo: d/c home today.  Has multidisciplinary conference on 10/28/12 at 10am in [facility] with PCP, social work, and Wellsite geologist as well.   The patient does have a current PCP Blanch Media, MD), therefore will be requiring [facility] follow-up after discharge.   The patient does have transportation limitations that hinder transportation to clinic appointments.  .Services Needed at time of discharge: Y = Yes, Blank = No PT:   OT:   RN: HOME HEALTH ALREADY ORDERED IN [facility]  Equipment:   Other: DIABETIC EDUCATION    LOS: 1 day   [name] 10/24/2012, 9:03 AM

## 2012-10-25 ENCOUNTER — Telehealth: Payer: Self-pay | Admitting: Dietician

## 2012-10-25 ENCOUNTER — Telehealth: Payer: Self-pay | Admitting: Licensed Clinical Social Worker

## 2012-10-25 LAB — CORTISOL-AM, BLOOD: Cortisol - AM: 4.8 ug/dL (ref 4.3–22.4)

## 2012-10-25 NOTE — Telephone Encounter (Signed)
CSW placed call to [name] to follow up on transportation for Monday's 1030 care meeting.  Pt states only she and her spouse, [name], will be there.  Pt voiced frustration with her mother.  Pt states mother threw her insulin in the trash and she and [name] need to find there own place.  [name] states spouse can manage her insulin and trach care.  CSW informed [name], housing resources can be explored at a later time,a s first we must have this Monday meeting.  Redirected pt back to current topic.  CSW inquired if pt had heard from [facility] for [facility] RN, pt states [facility] RN just left her house.  [name] along with spouse are on their way for her to be seen as a walk-in at [facility].  CSW encouraged pt to continue with her plan for mental health.  Pt has yet to schedule transportation to [employer].  Pt requesting CSW to set up transportation through [facility].  CSW placed call to [facility] appt's are booked until 11:00 am on Monday.  Medicaid transportation is not an option.  CSW inquiring with [facility] if transportation is available, awaiting response.  If [facility] is not able to provide transportation, CSW inquiring as last resort for [facility] to provide cab transportation for pt and spouse to meeting on Monday then provide pt and spouse with bus pass to return home.  Bus pass left with nursing.

## 2012-10-25 NOTE — Telephone Encounter (Signed)
Patient called to report that she is on her way to mental health with her nephew,her blood sugar was 188 this am, she only took 5 units of lantus at 10 AM. A nurse visited her this morning and gave her two bracelets that identify her as having diabetes. She verbalizes that she is upset with her mother who she says wants her money, charges her for rides, and is  going to get rid of her, put her out. She says her mother took her insulin and her needles. Zareya was able to state that after hours she is to call the resident on call at [phone] and ask for doctor on call. Coached her on saying internal medicine doctor on call.

## 2012-10-25 NOTE — Telephone Encounter (Signed)
Patient says she is at [facility] completing paperwork to establish care there. She needed our address and contact  Information to give them. Informed her that she does not have to be here Monday am for the  family meeting, and she does still have an appointment at 1:45 PM on Monday with the doctor. She says she will be here for her appointment and will call transportation right away.

## 2012-10-25 NOTE — Discharge Summary (Signed)
Internal Medicine Teaching [facility] Discharge Note  Name: [name] MRN: [id number] DOB: 09-01-1965 47 y.o.  Date of Admission: 10/23/2012  6:18 PM Date of Discharge: 10/25/2012 Attending Physician: Dr. Eben Burow Discharge Diagnosis: Principal Problem:   Hypoglycemia Active Problems:   Diabetes mellitus type 1, uncontrolled, insulin dependent   Tracheostomy dependence   History of hypothyroidism   Hyponatremia  Discharge Medications:   Medication List    TAKE these medications       ACCU-CHEK FASTCLIX LANCETS Misc  1 each by Does not apply route QID. Check blood sugar before meals and bedtime and as needed for symptoms of low blood sugar. Dx code 250.03 insulin dependent     albuterol (2.5 MG/3ML) 0.083% nebulizer solution  Commonly known as:  PROVENTIL  Take 3 mLs (2.5 mg total) by nebulization every 6 (six) hours as needed for wheezing.     albuterol 108 (90 BASE) MCG/ACT inhaler  Commonly known as:  PROVENTIL HFA;VENTOLIN HFA  Inhale 2 puffs into the lungs every 4 (four) hours as needed for wheezing or shortness of breath.     glucose 4 GM chewable tablet  Chew 4 tablets (16 g total) by mouth as needed for low blood sugar.     dextrose 40 % Gel  Commonly known as:  GLUTOSE  Take 1 Tube by mouth as needed (low blood sugar).     divalproex 125 MG DR tablet  Commonly known as:  DEPAKOTE  Take 1 tablet (125 mg total) by mouth daily.     feeding supplement Liqd  Take 237 mLs by mouth 3 (three) times daily between meals.     glucose blood test strip  Commonly known as:  ACCU-CHEK SMARTVIEW  Check blood sugar before meals and bedtime and as needed for symptoms of low blood sugar. Dx code 250.03 insulin dependent     insulin glargine 100 UNIT/ML injection  Commonly known as:  LANTUS  Inject 0.05 mLs (5 Units total) into the skin at bedtime.     INSULIN SYRINGE .3CC/31GX5/16" 31G X 5/16" 0.3 ML Misc  DX Code: 250.03 Inject insulin once a day.     latanoprost  0.005 % ophthalmic solution  Commonly known as:  XALATAN  Place 1 drop into both eyes at bedtime.     multivitamin with minerals Tabs  Take 1 tablet by mouth daily.       Disposition and follow-up:   [name] was discharged from [facility] in Stable condition.  At the hospital follow up visit please address: Poorly controlled DM 1: admitted for hypoglycemia, multiple similar episodes.  Wrongly dosing insulin at home.  Multidisciplinary meeting on 10/29/12 in [facility].  Questionable self injection of insulin at time of hospital discharge to delay hospital stay.  Decreased home lantus to 5 units ONLY QAM. See extensive discussion below in hospital course.  c-peptide <0.10 in setting of type 1DM, random insulin 3, AM Cortisol 4.8.  Dispo: Likely needs SNF placement.  Hyponatremia--Na 132 on discharge.  Repeat BMET as needed on follow up.    Hx of hypothyroidism: TSH level day of discharge resulted to be elevated 5.939, increased from 2.106 in November 2013.  Currently not on any medication at home.  Please follow up and start medication as needed.   Consider check free t4  Follow-up Appointments:     Follow-up Information   Follow up with Blanch Media, MD On 10/28/2012. (@10am )    Contact information:   [address] [place]  [place] [address] [phone]       Follow up with Elfredia Nevins, MD On 10/28/2012. (@145 , only if still necessary after your meeting)    Contact information:   [address] [place] [address] [phone]      Discharge Orders   Future Appointments Provider Department Dept Phone   10/28/2012 1:45 PM Elfredia Nevins, MD [facility] [phone]   11/08/2012 8:15 AM Allie Bossier, MD [facility] [phone]   Future Orders Complete By Expires     Call MD for:  persistant dizziness or light-headedness  As directed     Call MD for:  persistant nausea and vomiting  As directed      Call MD for:  As directed     Comments:      HYPOGLYCEMIA    Diet - low sodium heart healthy  As directed     Increase activity slowly  As directed       Procedures Performed:  Dg Chest Portable 1 View  10/11/2012  *RADIOLOGY REPORT*  Clinical Data: Shortness of breath and cough  PORTABLE CHEST - 1 VIEW  Comparison: 08/09/2012  Findings: The tracheostomy is again noted.  Cardiac shadow is stable.  The lungs are clear bilaterally.  IMPRESSION: No acute intrathoracic abnormality.   Original Report Authenticated By: Alcide Clever, M.D.    Admission HPI:  47 y.o history of DM 1 and multiple ED visits for hypoglycemia. She presented with hypoglycemia. [place] with blood glucose of 15 (around or after 5 PM on day of admission). Prior to being found unresponsive she states she was having symptoms of lightheadedness, ?loss of consciousness, confusion, tremors, feeling like she was having a seizure, increased heart rate, sweating, increased hunger. She tried to get help but was having trouble communicating her needs. She was given glucagon initially with improvement in her symptoms. She took Lantus 8 units on day of admission when glucose level was 289 and then later in the day she took 6 units of Lantus. Prior to taking any Lantus she states her glucose was 74 in the morning. In the ED her glucose was 71 initially. She was given D5 and her glucose initially trended up to 170 then continued to drop as low as 37. She was then given food in the ED. Of note, she was previously on Humalog but it caused more episodes of hypoglycemia per patient. She states she has not skipped meals on day of admission and for breakfast had meat, muffin and juice and lunch a cheeseburger and french fries. She denies alcohol use, she denies cocaine use (though UDS + 05/2012)  Hospital Course by problem list:   Hypoglycemia--likely secondary to misuse of Lantus. She claims she took 14  units on day of admission instead of the 6 units ONLY that she should be taking per PCP. She denies alcohol intake or recent drug use. No signs of ongoing infection (afebrile and no leukocytosis). Possible contribution could also be from adrenal insufficiency, decreased oral intake, post prandial hypoglycemia. LFTs wnl. It is clear that she has VERY poor insight into her management despite multiple attempts from multiple providers to assist her with diabetic management and glucose control. She appears competent, makes decisions for herself, is alert and oriented, and denies any suicidal or homicidal ideation. The fear is that if such episodes and poor control continues, she will eventually end up dying from this  disease and her poor management. This has been re-iterated to her MULTIPLE times and her PCP has now arranged for a multidisciplinary conference to discuss her in complete detail along with family members on 10/28/12. The hope is that she will be able to take control and better manage her diabetes, maintaining herself appropriately out of the hospital. TSH elevated 5.939,  insulin level random 3, C peptide <0.10, cortisol level AM 4.8 and these values will need to be addressed on hospital follow up on Monday 10/28/12.  She was also seen by Norm Parcel, opc diabetic educator who provided more diabetic education, provided 3 packs of 30cc syringes for easier administration of insulin and glucose tablets.  Instructed to take only 5 units of LANTUS at 10am daily, eat when blood sugar is low and drink water when blood sugar is high.  Maintained on hypoglycemic protocol.  At time of initial discharge, CBG noted to drop to 18 after she had been trying to resist discharge with RN earlier that day.  Security was called, insulin vials were found in hand bag along with pre-drawn syringes with insulin in them as well.  Items were removed from handbag at that time by RN and placed in safe location, which were then passed  on to her said husband at time of discharge under the agreement that she will hopefully take her insulin appropriately, he will supervise and monitor, and not take more doses than needed.  After speaking to mother on the phone as well, SNF would likely be more appropriate and safe for patient, however, she currently is hesitant for SNF.  Currently patient lives at home with daughter and boyfriend/husband and mother as primary care taker. This will likely be addressed on Monday with her conference with PCP and social work. Follow up with PCP.     Diabetes mellitus type 1, uncontrolled, insulin dependent---HA1C 10.2 08/2012.  Home dose of Lantus supposed to be 6 units qd per last documentation, however she apparently took 14 units day of admission.  CBGs were monitored during admission, she tolerated carb modified diet without complaints.  She was discharged on 5 UNITS OF LANTUS ONLY QAM and was 30cc syringes by diabetic educator along with more counseling prior to discharge. After speaking to [facility] and patient, the concern is she may be still using 100cc syringes at home which makes measuring 6 units difficult, thus with the 30cc syringes, 5 cc mark is clearly marked and should be easier to measure out. [name] also informed me of having sliding scale novolog at home, however, she says she does not use it. This needs to be discarded. I also explained to her in detail that she should only give herself insuline ONCE daily and then no need to administer anymore. She acknowledges complete understanding of new instructions and was able to relay it back to me. Initially, mother was to come pick her up for discharge and was going to be in charge of her insulin to help prevent multiple dosing, however, this later changed to her said husband picking her up and he said he would take responsibility for her insulin, relayed it back to me that she is to take 5 units of lantus only every morning at 10am, and [name]  insisted on discharge home with this man and was accepting of him supervising insulin.  The hope is that they whole family will attend the meeting on Monday in [facility] and that her self-destructive behavior can resolve and hopefully improve  her welfare.  She appears to be completely alert and oriented to person place and time, coherent, appropriately responds to questions, denies suicidal or homicidal ideation, insists that she is not trying to hurt herself by taking more insulin and she just thought that is what she was supposed to be taking based on her blood sugars.  Very poor insight and understanding.  Follow up with pcp and diabetes educator in [facility] advised.     Tracheostomy dependence--trach care maintained by pt and respiratory as needed.      Hyponatremia--BMET monitored during admission. Na 133 on admission and 132 on discharge.  Given IVF and then was tolerating diet well.  Alert and oriented x3.  Follow up bmet advised on hospital follow up visit with pcp.     Hx of hypothyroidism: TSH on 4/10 found to be elevated 5.9.  Not on any thyroid medication at this time.  Will need close follow up with PCP and consider treatment as appropriate.   Discharge Vitals:  BP 100/58  Pulse 91  Temp(Src) 98.1 F (36.7 C) (Oral)  Resp 16  SpO2 100%  Discharge Labs:  Results for orders placed during the hospital encounter of 10/23/12 (from the past 24 hour(s))  GLUCOSE, CAPILLARY     Status: Abnormal   Collection Time    10/24/12 11:49 AM      Result Value Range   Glucose-Capillary 18 (*) 70 - 99 mg/dL   Comment 1 Notify RN    GLUCOSE, CAPILLARY     Status: Abnormal   Collection Time    10/24/12 11:59 AM      Result Value Range   Glucose-Capillary 23 (*) 70 - 99 mg/dL   Comment 1 Notify RN    GLUCOSE, CAPILLARY     Status: Abnormal   Collection Time    10/24/12 12:08 PM      Result Value Range   Glucose-Capillary 26 (*) 70 - 99 mg/dL   Comment 1 Notify RN    GLUCOSE, CAPILLARY     Status: None    Collection Time    10/24/12 12:16 PM      Result Value Range   Glucose-Capillary 78  70 - 99 mg/dL   Comment 1 Notify RN    GLUCOSE, CAPILLARY     Status: None   Collection Time    10/24/12 12:26 PM      Result Value Range   Glucose-Capillary 97  70 - 99 mg/dL   Basic Metabolic Panel:  Recent Labs Lab 10/23/12 1857 10/24/12 0222 10/24/12 0256  NA 133* 132*  --   K 3.2* 4.0  --   CL 98 99  --   CO2 26 24  --   GLUCOSE 84 157*  --   BUN 11 11  --   CREATININE 0.63 0.62  --   CALCIUM 9.4 9.3  --   MG  --   --  2.1  PHOS  --   --  3.3   Liver Function Tests:  Recent Labs Lab 10/24/12 0256  AST 20  ALT 9  ALKPHOS 56  BILITOT 0.3  PROT 8.1  ALBUMIN 3.5   CBC:  Recent Labs Lab 10/19/12 0841 10/23/12 1857 10/24/12 0222  WBC 5.7 5.3 7.1  NEUTROABS 3.8 3.1  --   HGB 13.5 12.3 11.2*  HCT 40.5 37.5 33.9*  MCV 61.9* 61.9* 61.9*  PLT 250 211 210   CBG:  Recent Labs Lab 10/24/12 0746 10/24/12 1149 10/24/12 1159 10/24/12  1208 10/24/12 1216 10/24/12 1226  GLUCAP 97 18* 23* 26* 78 97   Thyroid Function Tests:  Recent Labs Lab 10/24/12 0256  TSH 5.939*   Urine Drug Screen: Drugs of Abuse     Component Value Date/Time   LABOPIA NONE DETECTED 08/09/2012 1314   COCAINSCRNUR NONE DETECTED 08/09/2012 1314   LABBENZ NONE DETECTED 08/09/2012 1314   AMPHETMU NONE DETECTED 08/09/2012 1314   THCU NONE DETECTED 08/09/2012 1314   LABBARB NONE DETECTED 08/09/2012 1314    Signed: [name] 10/25/2012, 10:14 AM   Time Spent on Discharge: 50 minutes Services Ordered on Discharge: multidisciplinary meeting on Monday, 10/28/12 in [facility] with family members Equipment Ordered on Discharge: none

## 2012-10-27 ENCOUNTER — Observation Stay (HOSPITAL_COMMUNITY)
Admission: EM | Admit: 2012-10-27 | Discharge: 2012-11-01 | Discharge status: Against Medical Advice | Payer: Medicaid Other | Attending: Internal Medicine | Admitting: Internal Medicine

## 2012-10-27 ENCOUNTER — Emergency Department (HOSPITAL_COMMUNITY)
Admission: EM | Admit: 2012-10-27 | Discharge: 2012-10-27 | Disposition: A | Discharge status: Home / Self Care | Payer: Self-pay | Attending: Emergency Medicine | Admitting: Emergency Medicine

## 2012-10-27 ENCOUNTER — Emergency Department (HOSPITAL_COMMUNITY): Payer: Medicaid Other

## 2012-10-27 ENCOUNTER — Encounter (HOSPITAL_COMMUNITY): Payer: Self-pay | Admitting: Emergency Medicine

## 2012-10-27 ENCOUNTER — Other Ambulatory Visit: Payer: Self-pay

## 2012-10-27 DIAGNOSIS — E871 Hypo-osmolality and hyponatremia: Secondary | ICD-10-CM | POA: Insufficient documentation

## 2012-10-27 DIAGNOSIS — Z91199 Patient's noncompliance with other medical treatment and regimen due to unspecified reason: Secondary | ICD-10-CM | POA: Insufficient documentation

## 2012-10-27 DIAGNOSIS — E161 Other hypoglycemia: Secondary | ICD-10-CM | POA: Insufficient documentation

## 2012-10-27 DIAGNOSIS — F1910 Other psychoactive substance abuse, uncomplicated: Secondary | ICD-10-CM

## 2012-10-27 DIAGNOSIS — R059 Cough, unspecified: Secondary | ICD-10-CM | POA: Insufficient documentation

## 2012-10-27 DIAGNOSIS — Z9119 Patient's noncompliance with other medical treatment and regimen: Secondary | ICD-10-CM | POA: Insufficient documentation

## 2012-10-27 DIAGNOSIS — Z930 Tracheostomy status: Secondary | ICD-10-CM | POA: Insufficient documentation

## 2012-10-27 DIAGNOSIS — E162 Hypoglycemia, unspecified: Secondary | ICD-10-CM

## 2012-10-27 DIAGNOSIS — F99 Mental disorder, not otherwise specified: Secondary | ICD-10-CM | POA: Diagnosis present

## 2012-10-27 DIAGNOSIS — F319 Bipolar disorder, unspecified: Secondary | ICD-10-CM | POA: Insufficient documentation

## 2012-10-27 DIAGNOSIS — E785 Hyperlipidemia, unspecified: Secondary | ICD-10-CM

## 2012-10-27 DIAGNOSIS — F489 Nonpsychotic mental disorder, unspecified: Secondary | ICD-10-CM

## 2012-10-27 DIAGNOSIS — Z794 Long term (current) use of insulin: Secondary | ICD-10-CM | POA: Insufficient documentation

## 2012-10-27 DIAGNOSIS — E1065 Type 1 diabetes mellitus with hyperglycemia: Secondary | ICD-10-CM | POA: Diagnosis present

## 2012-10-27 DIAGNOSIS — E109 Type 1 diabetes mellitus without complications: Secondary | ICD-10-CM

## 2012-10-27 DIAGNOSIS — F079 Unspecified personality and behavioral disorder due to known physiological condition: Secondary | ICD-10-CM | POA: Insufficient documentation

## 2012-10-27 DIAGNOSIS — R634 Abnormal weight loss: Secondary | ICD-10-CM | POA: Insufficient documentation

## 2012-10-27 DIAGNOSIS — Z0000 Encounter for general adult medical examination without abnormal findings: Secondary | ICD-10-CM

## 2012-10-27 DIAGNOSIS — R05 Cough: Secondary | ICD-10-CM | POA: Insufficient documentation

## 2012-10-27 DIAGNOSIS — IMO0002 Reserved for concepts with insufficient information to code with codable children: Secondary | ICD-10-CM | POA: Insufficient documentation

## 2012-10-27 DIAGNOSIS — T38801A Poisoning by unspecified hormones and synthetic substitutes, accidental (unintentional), initial encounter: Secondary | ICD-10-CM | POA: Insufficient documentation

## 2012-10-27 DIAGNOSIS — F1411 Cocaine abuse, in remission: Secondary | ICD-10-CM | POA: Insufficient documentation

## 2012-10-27 DIAGNOSIS — T383X1A Poisoning by insulin and oral hypoglycemic [antidiabetic] drugs, accidental (unintentional), initial encounter: Principal | ICD-10-CM | POA: Insufficient documentation

## 2012-10-27 LAB — CBC WITH DIFFERENTIAL/PLATELET
Basophils Absolute: 0 K/uL (ref 0.0–0.1)
Basophils Relative: 0 % (ref 0–1)
Eosinophils Absolute: 0 K/uL (ref 0.0–0.7)
Eosinophils Relative: 0 % (ref 0–5)
HCT: 35.4 % — ABNORMAL LOW (ref 36.0–46.0)
Hemoglobin: 11.8 g/dL — ABNORMAL LOW (ref 12.0–15.0)
Lymphocytes Relative: 16 % (ref 12–46)
Lymphs Abs: 1.7 10*3/uL (ref 0.7–4.0)
MCH: 20.6 pg — ABNORMAL LOW (ref 26.0–34.0)
MCHC: 33.3 g/dL (ref 30.0–36.0)
MCV: 61.9 fL — ABNORMAL LOW (ref 78.0–100.0)
Monocytes Absolute: 0.6 K/uL (ref 0.1–1.0)
Monocytes Relative: 5 % (ref 3–12)
Neutro Abs: 8.8 10*3/uL — ABNORMAL HIGH (ref 1.7–7.7)
Neutrophils Relative %: 79 % — ABNORMAL HIGH (ref 43–77)
Platelets: 187 K/uL (ref 150–400)
RBC: 5.72 MIL/uL — ABNORMAL HIGH (ref 3.87–5.11)
RDW: 15.7 % — ABNORMAL HIGH (ref 11.5–15.5)
WBC: 11.1 10*3/uL — ABNORMAL HIGH (ref 4.0–10.5)

## 2012-10-27 LAB — COMPREHENSIVE METABOLIC PANEL WITH GFR
AST: 18 U/L (ref 0–37)
Albumin: 3.2 g/dL — ABNORMAL LOW (ref 3.5–5.2)
CO2: 25 meq/L (ref 19–32)
Calcium: 9 mg/dL (ref 8.4–10.5)
Creatinine, Ser: 0.54 mg/dL (ref 0.50–1.10)
GFR calc non Af Amer: >90 mL/min (ref >90)
Sodium: 134 meq/L — ABNORMAL LOW (ref 135–145)
Total Protein: 7.8 g/dL (ref 6.0–8.3)

## 2012-10-27 LAB — POCT I-STAT TROPONIN I: Troponin i, poc: 0 ng/mL (ref 0.00–0.08)

## 2012-10-27 LAB — RAPID URINE DRUG SCREEN, HOSP PERFORMED
Amphetamines: NOT DETECTED
Barbiturates: NOT DETECTED
Benzodiazepines: NOT DETECTED
Cocaine: NOT DETECTED
Opiates: NOT DETECTED
Tetrahydrocannabinol: NOT DETECTED

## 2012-10-27 LAB — URINALYSIS, ROUTINE W REFLEX MICROSCOPIC
Bilirubin Urine: NEGATIVE
Glucose, UA: NEGATIVE mg/dL
Hgb urine dipstick: NEGATIVE
Ketones, ur: 15 mg/dL — AB
Leukocytes, UA: NEGATIVE
Nitrite: NEGATIVE
Protein, ur: NEGATIVE mg/dL
Specific Gravity, Urine: 1.015 (ref 1.005–1.030)
Urobilinogen, UA: 0.2 mg/dL (ref 0.0–1.0)
pH: 8.5 — ABNORMAL HIGH (ref 5.0–8.0)

## 2012-10-27 LAB — COMPREHENSIVE METABOLIC PANEL
ALT: 8 U/L (ref 0–35)
Alkaline Phosphatase: 50 U/L (ref 39–117)
BUN: 17 mg/dL (ref 6–23)
Chloride: 100 mEq/L (ref 96–112)
GFR calc Af Amer: >90 mL/min (ref >90)
Glucose, Bld: 251 mg/dL — ABNORMAL HIGH (ref 70–99)
Potassium: 4.4 mEq/L (ref 3.5–5.1)
Total Bilirubin: 0.2 mg/dL — ABNORMAL LOW (ref 0.3–1.2)

## 2012-10-27 LAB — GLUCOSE, CAPILLARY
Glucose-Capillary: 276 mg/dL — ABNORMAL HIGH (ref 70–99)
Glucose-Capillary: 275 mg/dL — ABNORMAL HIGH (ref 70–99)

## 2012-10-27 LAB — LIPASE, BLOOD: Lipase: 12 U/L (ref 11–59)

## 2012-10-27 MED ORDER — ALBUTEROL SULFATE HFA 108 (90 BASE) MCG/ACT IN AERS: 2.0000 | INHALATION_SPRAY | RESPIRATORY_TRACT | Status: DC | PRN

## 2012-10-27 MED ORDER — ADULT MULTIVITAMIN W/MINERALS CH
1.0000 | ORAL_TABLET | Freq: Every day | ORAL | Status: DC
  Administered 2012-10-27 – 2012-11-01 (×6): 1 via ORAL
  Filled 2012-10-27 (×7): qty 1

## 2012-10-27 MED ORDER — INSULIN GLARGINE 100 UNIT/ML ~~LOC~~ SOLN
5.0000 [IU] | Freq: Every day | SUBCUTANEOUS | Status: DC
  Administered 2012-10-28 – 2012-10-31 (×4): 5 [IU] via SUBCUTANEOUS
  Filled 2012-10-27 (×5): qty 0.05

## 2012-10-27 MED ORDER — ACETAMINOPHEN 650 MG RE SUPP: 650.0000 mg | Freq: Four times a day (QID) | RECTAL | Status: DC | PRN

## 2012-10-27 MED ORDER — DIVALPROEX SODIUM 125 MG PO DR TAB
125.0000 mg | DELAYED_RELEASE_TABLET | Freq: Every day | ORAL | Status: DC
  Administered 2012-10-27 – 2012-11-01 (×6): 125 mg via ORAL
  Filled 2012-10-27 (×7): qty 1

## 2012-10-27 MED ORDER — INSULIN ASPART 100 UNIT/ML ~~LOC~~ SOLN
0.0000 [IU] | Freq: Three times a day (TID) | SUBCUTANEOUS | Status: DC
  Administered 2012-10-27: 2 [IU] via SUBCUTANEOUS
  Administered 2012-10-28: 3 [IU] via SUBCUTANEOUS

## 2012-10-27 MED ORDER — ENOXAPARIN SODIUM 40 MG/0.4ML ~~LOC~~ SOLN
40.0000 mg | SUBCUTANEOUS | Status: DC
  Administered 2012-10-27 – 2012-11-01 (×6): 40 mg via SUBCUTANEOUS
  Filled 2012-10-27 (×6): qty 0.4

## 2012-10-27 MED ORDER — FERROUS SULFATE 325 (65 FE) MG PO TABS
325.0000 mg | ORAL_TABLET | Freq: Every day | ORAL | Status: DC
  Administered 2012-10-28 – 2012-11-01 (×5): 325 mg via ORAL
  Filled 2012-10-27 (×6): qty 1

## 2012-10-27 MED ORDER — ENSURE COMPLETE PO LIQD
237.0000 mL | Freq: Three times a day (TID) | ORAL | Status: DC
  Administered 2012-10-27 – 2012-10-28 (×2): 237 mL via ORAL
  Filled 2012-10-27: qty 237

## 2012-10-27 MED ORDER — ACETAMINOPHEN 325 MG PO TABS
650.0000 mg | ORAL_TABLET | Freq: Four times a day (QID) | ORAL | Status: DC | PRN
  Administered 2012-10-31: 650 mg via ORAL
  Filled 2012-10-27: qty 2

## 2012-10-27 MED ORDER — LATANOPROST 0.005 % OP SOLN
1.0000 [drp] | Freq: Every day | OPHTHALMIC | Status: DC
  Administered 2012-10-27 – 2012-10-31 (×4): 1 [drp] via OPHTHALMIC
  Filled 2012-10-27: qty 2.5

## 2012-10-27 NOTE — ED Notes (Signed)
3 RN attempted IV insertion two times unsuccessfully. MD notified. IV team paged.
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
## 2012-10-27 NOTE — ED Notes (Signed)
CBG 169. 

## 2012-10-27 NOTE — ED Notes (Signed)
Pt transported to radiology.

## 2012-10-27 NOTE — ED Notes (Signed)
Internal medicine MD at bedside

## 2012-10-27 NOTE — ED Provider Notes (Signed)
Author: Gavin Pound. Oletta Lamas, MD Service: (none) Author Type: Physician    Filed: 10/27/2012 12:24 PM Note Time: 10/27/2012 10:08 AM           History        CSN: [id number]   Arrival date & time 10/27/12  0951    First MD Initiated Contact with Patient 10/27/12 1003         Chief Complaint   Patient presents with   .  Hypoglycemia        (Consider location/radiation/quality/duration/timing/severity/associated sxs/prior treatment) HPI Comments: Level 5 caveat due to acuity of patient, mild altered mentation.  Pt with h/o DM, on insulin, brought in by ESM found hypoglycemic.  Reportedly initial blood sugar was 19, unable to get IV access. IM glucagon was given by EMS with blood sugar increasing to 61. She was then given some oral glucose and presently in the emergency department, blood sugar is now 169. Patient knows that she is at [facility], knows that it is Sunday, knows her name. She reports she was up earlier today and did take her insulin. She seems hesitant as far as whether or not she recalls eating breakfast or not. I reviewed her prior history quickly. There no recent past medical history work old charts in our system. Pt reports no HA, CP, has had a dry cough and some choking recently.  She is requesting her television on, the telephone, and chocolate milk.     The history is provided by the patient and the EMS personnel. The history is limited by the condition of the patient.      History reviewed. No pertinent past medical history.   History reviewed. No pertinent past surgical history.   History reviewed. No pertinent family history.    History   Substance Use Topics   .  Smoking status:  Not on file   .  Smokeless tobacco:  Not on file   .  Alcohol Use:  Not on file         OB History     Grav  Para  Term  Preterm  Abortions  TAB  SAB  Ect  Mult  Living                                  Review of Systems  Unable to perform ROS: Acuity of  condition       Allergies    Review of patient's allergies indicates not on file.    Home Medications    No current outpatient prescriptions on file.   BP 125/81  Pulse 88  Temp(Src) 98 F (36.7 C) (Oral)  Resp 18  SpO2 100%   Physical Exam  Nursing note and vitals reviewed. Constitutional: She is oriented to person, place, and time. She appears well-developed and well-nourished. She appears listless. She is easily aroused.  Non-toxic appearance. No distress.  Pt falls asleep in the middle of speaking, seems drowsy, will snore.  Pt with food particulates around lips and peanut butter still on her tongue.    HENT:   Head: Normocephalic and atraumatic.   Mouth/Throat: No oropharyngeal exudate.  Eyes: EOM are normal. Pupils are equal, round, and reactive to light.  Neck: Normal range of motion. Neck supple.  Cardiovascular: Regular rhythm.    No murmur heard. Mild sinus tachycardia  Pulmonary/Chest: Effort normal. No respiratory distress. She has no wheezes. She exhibits no tenderness.  Rhonchi, symmetric breath sounds  Abdominal: Soft. She exhibits no distension. There is no tenderness.  Neurological: She is oriented to person, place, and time and easily aroused. She appears listless. No cranial nerve deficit. Coordination normal.  Skin: Skin is warm and dry. No rash noted. She is not diaphoretic.       ED Course    Procedures (including critical care time)    Labs Reviewed   GLUCOSE, CAPILLARY - Abnormal; Notable for the following:      Glucose-Capillary  169 (*)       All other components within normal limits   CBC WITH DIFFERENTIAL   URINALYSIS, ROUTINE W REFLEX MICROSCOPIC   URINE RAPID DRUG SCREEN (HOSP PERFORMED)      No results found.     No diagnosis found.   Room air saturation is 100% I interpret this to be normal.   EKG at time time: 25, shows sinus rhythm at a rate of 88. Axis is normal. Possible left atrial enlargement. No ST or T-wave  abnormalities. Possible anteroseptal MI, age indeterminate that was also seen previously on EKG from 10/23/2012. Abn ECG without acute changes.       10:48 AM I reviewed prior records and found that pt's true records are listed under [name], not [name].  Pt is followed by [facility] and has had numerous visits to the ED recently due to hypoglycemia.  Will consult OPC to see pt as pt has had recurring social issues with family, poor support system and clearly mismanaged diabetes management as outpt.  Will let registration know of pt's correct demographic and name spelling.       12:23 PM Have merged pt's records to match correct identifiers.  Will copy and paste this record to her new current chart.        MDM    Pt with hypoglycemia.  Pt is still listless, drowsy, will get labs, drug screens, monitor closely.  I think at this time, history is still somewhat unreliable.  No prior records seen in [facility] upon my initial review.  Will get CXR given coughing.                 Gavin Pound. Nicole Hitson, MD     ____________________________________________________   The above was copied from chart [name] which was misidentified at patient arrival.  Below are new events and ED course as they occur.   ________________________________________________________________   2:55 PM Pt was seen by [facility] resident team, decision made to re-admit.  Pt was to have multidisciplinary meeting tomorrow anyway.  Head CT shows no acute abn's.    Results for orders placed during the hospital encounter of 10/27/12 (from the past 24 hour(s))  URINALYSIS, ROUTINE W REFLEX MICROSCOPIC     Status: Abnormal   Collection Time    10/27/12 11:56 AM      Result Value Range   Color, Urine YELLOW  YELLOW   APPearance CLEAR  CLEAR   Specific Gravity, Urine 1.015  1.005 - 1.030   pH 8.5 (*) 5.0 - 8.0   Glucose, UA NEGATIVE  NEGATIVE mg/dL   Hgb urine dipstick NEGATIVE  NEGATIVE   Bilirubin Urine  NEGATIVE  NEGATIVE   Ketones, ur 15 (*) NEGATIVE mg/dL   Protein, ur NEGATIVE  NEGATIVE mg/dL   Urobilinogen, UA 0.2  0.0 - 1.0 mg/dL   Nitrite NEGATIVE  NEGATIVE   Leukocytes, UA NEGATIVE  NEGATIVE  URINE RAPID DRUG SCREEN (HOSP PERFORMED)  Status: None   Collection Time    10/27/12 11:56 AM      Result Value Range   Opiates NONE DETECTED  NONE DETECTED   Cocaine NONE DETECTED  NONE DETECTED   Benzodiazepines NONE DETECTED  NONE DETECTED   Amphetamines NONE DETECTED  NONE DETECTED   Tetrahydrocannabinol NONE DETECTED  NONE DETECTED   Barbiturates NONE DETECTED  NONE DETECTED  CBC WITH DIFFERENTIAL     Status: Abnormal   Collection Time    10/27/12 11:58 AM      Result Value Range   WBC 11.1 (*) 4.0 - 10.5 K/uL   RBC 5.72 (*) 3.87 - 5.11 MIL/uL   Hemoglobin 11.8 (*) 12.0 - 15.0 g/dL   HCT 57.8 (*) 46.9 - 62.9 %   MCV 61.9 (*) 78.0 - 100.0 fL   MCH 20.6 (*) 26.0 - 34.0 pg   MCHC 33.3  30.0 - 36.0 g/dL   RDW 52.8 (*) 41.3 - 24.4 %   Platelets 187  150 - 400 K/uL   Neutrophils Relative 79 (*) 43 - 77 %   Neutro Abs 8.8 (*) 1.7 - 7.7 K/uL   Lymphocytes Relative 16  12 - 46 %   Lymphs Abs 1.7  0.7 - 4.0 K/uL   Monocytes Relative 5  3 - 12 %   Monocytes Absolute 0.6  0.1 - 1.0 K/uL   Eosinophils Relative 0  0 - 5 %   Eosinophils Absolute 0.0  0.0 - 0.7 K/uL   Basophils Relative 0  0 - 1 %   Basophils Absolute 0.0  0.0 - 0.1 K/uL  COMPREHENSIVE METABOLIC PANEL     Status: Abnormal   Collection Time    10/27/12 11:58 AM      Result Value Range   Sodium 134 (*) 135 - 145 mEq/L   Potassium 4.4  3.5 - 5.1 mEq/L   Chloride 100  96 - 112 mEq/L   CO2 25  19 - 32 mEq/L   Glucose, Bld 251 (*) 70 - 99 mg/dL   BUN 17  6 - 23 mg/dL   Creatinine, Ser 0.10  0.50 - 1.10 mg/dL   Calcium 9.0  8.4 - 27.2 mg/dL   Total Protein 7.8  6.0 - 8.3 g/dL   Albumin 3.2 (*) 3.5 - 5.2 g/dL   AST 18  0 - 37 U/L   ALT 8  0 - 35 U/L   Alkaline Phosphatase 50  39 - 117 U/L   Total Bilirubin 0.2 (*)  0.3 - 1.2 mg/dL   GFR calc non Af Amer >90  >90 mL/min   GFR calc Af Amer >90  >90 mL/min  LIPASE, BLOOD     Status: None   Collection Time    10/27/12 11:58 AM      Result Value Range   Lipase 12  11 - 59 U/L  POCT I-STAT TROPONIN I     Status: None   Collection Time    10/27/12 12:27 PM      Result Value Range   Troponin i, poc 0.00  0.00 - 0.08 ng/mL   Comment 3              Gavin Pound. Sailor Hevia, MD 10/27/12 1455

## 2012-10-27 NOTE — ED Notes (Signed)
IV team at bedside 

## 2012-10-27 NOTE — ED Notes (Signed)
Patients name is wrong in the computer

## 2012-10-27 NOTE — ED Notes (Signed)
Patient brought in via [facility] with hypoglycemia  CBL 19 upon arrival No IV access 1 mg of glucagon given CBG up to 61 and then oral glucose given CBG up to 76. Patient now alert 

## 2012-10-27 NOTE — ED Provider Notes (Signed)
History     CSN: [id number]  Arrival date & time 10/27/12  4098   First MD Initiated Contact with Patient 10/27/12 1003      Chief Complaint  Patient presents with  . Hypoglycemia    (Consider location/radiation/quality/duration/timing/severity/associated sxs/prior treatment) HPI Comments: Level 5 caveat due to acuity of patient, mild altered mentation.  Pt with h/o DM, on insulin, brought in by ESM found hypoglycemic.  Reportedly initial blood sugar was 19, unable to get IV access. IM glucagon was given by EMS with blood sugar increasing to 61. She was then given some oral glucose and presently in the emergency department, blood sugar is now 169. Patient knows that she is at [facility], knows that it is Sunday, knows her name. She reports she was up earlier today and did take her insulin. She seems hesitant as far as whether or not she recalls eating breakfast or not. I reviewed her prior history quickly. There no recent past medical history work old charts in our system. Pt reports no HA, CP, has had a dry cough and some choking recently.  She is requesting her television on, the telephone, and chocolate milk.    The history is provided by the patient and the EMS personnel. The history is limited by the condition of the patient.    History reviewed. No pertinent past medical history.  History reviewed. No pertinent past surgical history.  History reviewed. No pertinent family history.  History  Substance Use Topics  . Smoking status: Not on file  . Smokeless tobacco: Not on file  . Alcohol Use: Not on file    OB History   Grav Para Term Preterm Abortions TAB SAB Ect Mult Living                  Review of Systems  Unable to perform ROS: Acuity of condition    Allergies  Review of patient's allergies indicates not on file.  Home Medications  No current outpatient prescriptions on file.  BP 125/81  Pulse 88  Temp(Src) 98 F (36.7 C) (Oral)  Resp 18  SpO2  100%  Physical Exam  Nursing note and vitals reviewed. Constitutional: She is oriented to person, place, and time. She appears well-developed and well-nourished. She appears listless. She is easily aroused.  Non-toxic appearance. No distress.  Pt falls asleep in the middle of speaking, seems drowsy, will snore.  Pt with food particulates around lips and peanut butter still on her tongue.    HENT:  Head: Normocephalic and atraumatic.  Mouth/Throat: No oropharyngeal exudate.  Eyes: EOM are normal. Pupils are equal, round, and reactive to light.  Neck: Normal range of motion. Neck supple.  Cardiovascular: Regular rhythm.   No murmur heard. Mild sinus tachycardia  Pulmonary/Chest: Effort normal. No respiratory distress. She has no wheezes. She exhibits no tenderness.  Rhonchi, symmetric breath sounds  Abdominal: Soft. She exhibits no distension. There is no tenderness.  Neurological: She is oriented to person, place, and time and easily aroused. She appears listless. No cranial nerve deficit. Coordination normal.  Skin: Skin is warm and dry. No rash noted. She is not diaphoretic.    ED Course  Procedures (including critical care time)  Labs Reviewed  GLUCOSE, CAPILLARY - Abnormal; Notable for the following:    Glucose-Capillary 169 (*)    All other components within normal limits  CBC WITH DIFFERENTIAL  URINALYSIS, ROUTINE W REFLEX MICROSCOPIC  URINE RAPID DRUG SCREEN (HOSP PERFORMED)   No  results found.   No diagnosis found.  Room air saturation is 100% I interpret this to be normal.  EKG at time time: 25, shows sinus rhythm at a rate of 88. Axis is normal. Possible left atrial enlargement. No ST or T-wave abnormalities. Possible anteroseptal MI, age indeterminate that was also seen previously on EKG from 10/23/2012. Abn ECG without acute changes.    10:48 AM I reviewed prior records and found that pt's true records are listed under [name], not [name].  Pt is  followed by [facility] and has had numerous visits to the ED recently due to hypoglycemia.  Will consult OPC to see pt as pt has had recurring social issues with family, poor support system and clearly mismanaged diabetes management as outpt.  Will let registration know of pt's correct demographic and name spelling.     12:23 PM Have merged pt's records to match correct identifiers.  Will copy and paste this record to her new current chart.    MDM  Pt with hypoglycemia.  Pt is still listless, drowsy, will get labs, drug screens, monitor closely.  I think at this time, history is still somewhat unreliable.  No prior records seen in [facility] upon my initial review.  Will get CXR given coughing.          Gavin Pound. Shirlena Brinegar, MD 10/27/12 1224

## 2012-10-27 NOTE — ED Notes (Signed)
Patient brought in via [facility] with hypoglycemia  CBL 19 upon arrival No IV access 1 mg of glucagon given CBG up to 61 and then oral glucose given CBG up to 76. Patient now alert

## 2012-10-28 ENCOUNTER — Ambulatory Visit: Payer: Medicaid Other | Admitting: Radiation Oncology

## 2012-10-28 ENCOUNTER — Encounter: Payer: Self-pay | Admitting: Internal Medicine

## 2012-10-28 DIAGNOSIS — F319 Bipolar disorder, unspecified: Secondary | ICD-10-CM

## 2012-10-28 DIAGNOSIS — E1069 Type 1 diabetes mellitus with other specified complication: Secondary | ICD-10-CM

## 2012-10-28 LAB — GLUCOSE, CAPILLARY
Glucose-Capillary: 169 mg/dL — ABNORMAL HIGH (ref 70–99)
Glucose-Capillary: 455 mg/dL — ABNORMAL HIGH (ref 70–99)
Glucose-Capillary: 342 mg/dL — ABNORMAL HIGH (ref 70–99)
Glucose-Capillary: 65 mg/dL — ABNORMAL LOW (ref 70–99)
Glucose-Capillary: 100 mg/dL — ABNORMAL HIGH (ref 70–99)
Glucose-Capillary: 64 mg/dL — ABNORMAL LOW (ref 70–99)

## 2012-10-28 LAB — BASIC METABOLIC PANEL WITH GFR
BUN: 17 mg/dL (ref 6–23)
CO2: 28 meq/L (ref 19–32)
Chloride: 98 meq/L (ref 96–112)
Glucose, Bld: 117 mg/dL — ABNORMAL HIGH (ref 70–99)
Potassium: 4.3 meq/L (ref 3.5–5.1)
Sodium: 134 meq/L — ABNORMAL LOW (ref 135–145)

## 2012-10-28 LAB — BASIC METABOLIC PANEL
Calcium: 9.4 mg/dL (ref 8.4–10.5)
Creatinine, Ser: 0.6 mg/dL (ref 0.50–1.10)
GFR calc Af Amer: >90 mL/min (ref >90)
GFR calc non Af Amer: >90 mL/min (ref >90)

## 2012-10-28 MED ORDER — INSULIN ASPART 100 UNIT/ML ~~LOC~~ SOLN
5.0000 [IU] | Freq: Once | SUBCUTANEOUS | Status: AC
  Administered 2012-10-28: 5 [IU] via SUBCUTANEOUS

## 2012-10-28 MED ORDER — GLUCOSE 40 % PO GEL: 1.0000 | ORAL | Status: DC | PRN

## 2012-10-28 MED ORDER — GLUCOSE 40 % PO GEL
ORAL | Status: AC
  Filled 2012-10-28: qty 1

## 2012-10-28 MED ORDER — GLUCERNA SHAKE PO LIQD: 237.0000 mL | Freq: Every day | ORAL | Status: DC | PRN

## 2012-10-28 NOTE — Progress Notes (Addendum)
Subjective: [name] was seen and examined this morning.  She claims to be feeling much better this morning and asking for chocolate ensure.  She appears very comfortably in the hospital.  She claims she only takes 5 units of Lantus every morning since discharge but checks her blood sugar every 2 hours and records values.  She denies taking any more insulin but says without insulin her sugar does not get below 300, so it is unclear if she is giving herself more insulin--which would explain her hypoglycemia frequency however then she denies it.  She is aware of her [facility] meeting this morning and is agreeable to still go.    Objective: Vital signs in last 24 hours: Filed Vitals:   10/27/12 1500 10/27/12 1605 10/27/12 2100 10/28/12 0500  BP: 119/90 135/81 131/81 123/85  Pulse: 89 96 90 83  Temp:  98.4 F (36.9 C) 98.4 F (36.9 C) 98.1 F (36.7 C)  TempSrc:  Oral Oral Oral  Resp: 17 18 18 18   Height:  5' 0.1" (1.527 m)    Weight:  100 lb 5 oz (45.5 kg)    SpO2: 100% 100% 100% 99%   Weight change:  No intake or output data in the 24 hours ending 10/28/12 0803 Vitals reviewed. General: resting in bed, NAD General: No acute distress, alert and oriented x 3 HEENT: PERRL, EOMI, trach site appears closed, no blood or mucus around site  Cardiovascular: Regular rate and rhythm, no murmurs, rubs or gallops  Respiratory: b/l rhonchi, mild expiratory wheezing anterior, poor effort  Abdomen: Soft, nondistended, nontender, bowel sounds present  Extremities: Warm and well-perfused, no edema. Moving all 4 extremities.  Skin: Warm, dry, no rashes  Psych : childlike affect and thanks God often.  Neuro: Alert and oriented, responsive, follows commands  Lab Results: Basic Metabolic Panel:  Recent Labs Lab 10/24/12 0256 10/27/12 1158 10/28/12 0538  NA  --  134* 134*  K  --  4.4 4.3  CL  --  100 98  CO2  --  25 28  GLUCOSE  --  251* 117*  BUN  --  17 17  CREATININE  --  0.54 0.60  CALCIUM  --   9.0 9.4  MG 2.1  --   --   PHOS 3.3  --   --    Liver Function Tests:  Recent Labs Lab 10/24/12 0256 10/27/12 1158  AST 20 18  ALT 9 8  ALKPHOS 56 50  BILITOT 0.3 0.2*  PROT 8.1 7.8  ALBUMIN 3.5 3.2*    Recent Labs Lab 10/27/12 1158  LIPASE 12   CBC:  Recent Labs Lab 10/23/12 1857 10/24/12 0222 10/27/12 1158  WBC 5.3 7.1 11.1*  NEUTROABS 3.1  --  8.8*  HGB 12.3 11.2* 11.8*  HCT 37.5 33.9* 35.4*  MCV 61.9* 61.9* 61.9*  PLT 211 210 187   CBG:  Recent Labs Lab 10/24/12 1226 10/27/12 1611 10/27/12 1718 10/27/12 2149 10/28/12 0139 10/28/12 0746  GLUCAP 97 276* 275* 170* 258* 75   Thyroid Function Tests:  Recent Labs Lab 10/24/12 0256  TSH 5.939*   Urine Drug Screen: Drugs of Abuse     Component Value Date/Time   LABOPIA NONE DETECTED 10/27/2012 1156   COCAINSCRNUR NONE DETECTED 10/27/2012 1156   LABBENZ NONE DETECTED 10/27/2012 1156   AMPHETMU NONE DETECTED 10/27/2012 1156   THCU NONE DETECTED 10/27/2012 1156   LABBARB NONE DETECTED 10/27/2012 1156    Urinalysis:  Recent Labs Lab 10/27/12 1156  COLORURINE YELLOW  LABSPEC 1.015  PHURINE 8.5*  GLUCOSEU NEGATIVE  HGBUR NEGATIVE  BILIRUBINUR NEGATIVE  KETONESUR 15*  PROTEINUR NEGATIVE  UROBILINOGEN 0.2  NITRITE NEGATIVE  LEUKOCYTESUR NEGATIVE   Studies/Results: Ct Head Wo Contrast  10/27/2012  *RADIOLOGY REPORT*  Clinical Data: Altered mental status.  Hypoglycemia.  CT HEAD WITHOUT CONTRAST  Technique:  Contiguous axial images were obtained from the base of the skull through the vertex without contrast.  Comparison: Head CT scan 11/14/2011.  Findings: No evidence of acute intracranial abnormality including infarct, hemorrhage, mass lesion, mass effect, midline shift or abnormal extra-axial fluid collection is identified.  The calvarium is intact.  There is mucosal thickening in the right sphenoid sinus.  No pneumocephalus or hydrocephalus.  IMPRESSION:  1.  No acute intra abnormality. 2.  Right  sphenoid sinus disease.   Original Report Authenticated By: Holley Dexter, M.D.    Medications: I have reviewed the patient's current medications. Scheduled Meds: . divalproex  125 mg Oral Daily  . enoxaparin (LOVENOX) injection  40 mg Subcutaneous Q24H  . feeding supplement  237 mL Oral TID BM  . ferrous sulfate  325 mg Oral Q breakfast  . insulin aspart  0-4 Units Subcutaneous TID WC  . insulin glargine  5 Units Subcutaneous QHS  . latanoprost  1 drop Both Eyes QHS  . multivitamin with minerals  1 tablet Oral Daily   Continuous Infusions:  PRN Meds:.acetaminophen, acetaminophen, albuterol Assessment/Plan: 47 y.o female with poorly controlled type 1 DM who presents to the ED with hypoglycemia with history of multiple similar episodes leading to admission.   Hypoglycemia in setting of insulin use/misuse--history of multiple episodes of hypoglycemia with ED visits and hospitalizations. Most likely secondary to exogenous and improper insulin use. Other causes of hypoglycemia were also considered including poor PO intake, insulinoma (less likely due to low blood insulin level). Insulin autoimmune hypoglycemia may a possible etiology as well.  She was recently discharged from the hospital on 4/11 with 5u lantus QAM without a sliding scale. Her last C-peptide level was less than 0.10 on 4/10 and her random blood insulin level was 3 during the last admission.  -d/c to [facility] this morning for her meeting that has been pre-arranged by PCP and then d/c to home.  -continue lantus 5u daily -CBGs monitoring.  CBG this AM 75 -hypoglycemic protocol  -psych consult for competency, discussed with pcp as well.    Diabetes mellitus type 1, uncontrolled, insulin dependent  Last Hgb A1C was 10.2 in February 2014. Her most recent office visit shows that her mealtime coverage was discontinued in setting of hypoglycemic events.  -cont 5u lantus daily as above  -monitor closely for exogenous administration   -f/u in OPC this AM  Mental Disorder--unidentified.  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  Used to follow at mental health but does not anymore.  Has been on Risperdal in the past per pcp.  Hx of crack cocaine abuse.   -pending psych consult: correctly identifying mental disorder and treatment as appropriate.   -?competence  Tracheostomy dependent-chronic respiratory failure  She is now s/p removal of trach all weekend, claims she was cleaning it this weekend and when she went to put back in her site was closed.  Currently talking and eating well, no complaints of dysphagia.  Per PCP, when she last spoke to Dr. 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  Will need to follow up with Dr. Pollyann Kennedy now for status of trach dependence.    -continue to monitor  -f/u Dr. Jacqlyn Larsen with him over the telephone this AM.  Will monitor closely for now, maybe she can get by without it, however, if respiratory status changes, stridor present, will need to contact him and take back to OR.    F/E/N  -Mild Hyponatremia, 134 on admission-asymptomatic. Will monitor BMET  -tolerating carb modified diet without complaints -cont ensure and iron supplementation  -nutrition consult--calorie count  DVT px  -lovenox   Dispo: d/c pending psych consult.  Competency? Likely needs to be safely discharged to SNF but pt refuses.     The patient does have a current PCP Nicole Media, MD), therefore will be requiring OPC follow-up after discharge.  The patient does have transportation limitations that hinder transportation to clinic appointments.  Services Needed at time of discharge: Y = Yes, Blank = No PT:   OT:   RN:   Equipment:   Other:     LOS: 1 day   [name] 10/28/2012, 8:03 AM

## 2012-10-28 NOTE — Progress Notes (Signed)
PT refuses to let RN start an IV on her to give her glucose via IV. Pt still refuses to drink orange juice or to take dextrose oral gel. RN explained to the the dangers of her cbg dropping low and the importance of her drinking the orange juice or taking the dextrose. Pt still refuses. RN will continue to monitor pt and try to encourage pt to take something for her cbg

## 2012-10-28 NOTE — H&P (Signed)
Internal Medicine Attending Admission Note Date: 10/28/2012  Patient name: [name] Medical record number: [id number] Date of birth: [birth year] Age: 47 y.o. Gender: female  I saw and evaluated the patient. I reviewed the resident's note and I agree with the resident's findings and plan as documented in the resident's note.  Chief Complaint(s): Hypoglycemia  History - key components related to admission: [name] is a 47 year old lady with a history of type 1 diabetes, chronic tracheostomy and multiple ED visits for hypoglycemia presented with another episode of hypoglycemia. Patient states that her last administration of Lantus was the day prior to admission at 10 AM. She did not eat breakfast on the morning of admission. She denies using any other insulin and states that she is supposed to take 5 units of Lantus every morning and takes it as prescribed. She does, however, also states that she takes 30 cc's of insulin.   Patient was recently discharged from the hospital with recurrent episodes of hypoglycemia, suspected to be caused by self injection with insulin above the recommended and prescribed dose.   15 point review of systems was negative except what is noted.  Past medical history past surgical history, medications, social history and family history was reviewed and is as per resident's note.   Physical Exam - key components related to admission:  Filed Vitals:   10/27/12 1500 10/27/12 1605 10/27/12 2100 10/28/12 0500  BP: 119/90 135/81 131/81 123/85  Pulse: 89 96 90 83  Temp:  98.4 F (36.9 C) 98.4 F (36.9 C) 98.1 F (36.7 C)  TempSrc:  Oral Oral Oral  Resp: 17 18 18 18   Height:  5' 0.1" (1.527 m)    Weight:  100 lb 5 oz (45.5 kg)    SpO2: 100% 100% 100% 99%   Physical Exam: General: Vital signs reviewed and noted. Well-developed, well-nourished, in no acute distress; alert, appropriate and cooperative throughout examination.  Head: Normocephalic, atraumatic.   Eyes: PERRL, EOMI, No signs of anemia or jaundince.  Nose: Mucous membranes moist, not inflammed, nonerythematous.  Throat: Oropharynx nonerythematous, no exudate appreciated.   Neck: No deformities, masses, or tenderness noted.Supple, No carotid Bruits, no JVD.  Lungs:  Normal respiratory effort. Clear to auscultation BL without crackles or wheezes.  Heart: RRR. S1 and S2 normal without gallop, murmur, or rubs.  Abdomen:  BS normoactive. Soft, Nondistended, non-tender.  No masses or organomegaly.  Extremities: No pretibial edema.  Neurologic: A&O X3, CN II - XII are grossly intact. Motor strength is 5/5 in the all 4 extremities, Sensations intact to light touch, Cerebellar signs negative.  Skin: No visible rashes, scars.    Lab results:  Basic Metabolic Panel:  Recent Labs  40/98/11 1158 10/28/12 0538  NA 134* 134*  K 4.4 4.3  CL 100 98  CO2 25 28  GLUCOSE 251* 117*  BUN 17 17  CREATININE 0.54 0.60  CALCIUM 9.0 9.4   Liver Function Tests:  Recent Labs  10/27/12 1158  AST 18  ALT 8  ALKPHOS 50  BILITOT 0.2*  PROT 7.8  ALBUMIN 3.2*    Recent Labs  10/27/12 1158  LIPASE 12   CBC:  Recent Labs  10/27/12 1158  WBC 11.1*  NEUTROABS 8.8*  HGB 11.8*  HCT 35.4*  MCV 61.9*  PLT 187   CBG:  Recent Labs  10/27/12 1611 10/27/12 1718 10/27/12 2149 10/28/12 0139 10/28/12 0746  GLUCAP 276* 275* 170* 258* 75    Imaging results:  Ct Head Wo Contrast  10/27/2012  *RADIOLOGY REPORT*  Clinical Data: Altered mental status.  Hypoglycemia.  CT HEAD WITHOUT CONTRAST  Technique:  Contiguous axial images were obtained from the base of the skull through the vertex without contrast.  Comparison: Head CT scan 11/14/2011.  Findings: No evidence of acute intracranial abnormality including infarct, hemorrhage, mass lesion, mass effect, midline shift or abnormal extra-axial fluid collection is identified.  The calvarium is intact.  There is mucosal thickening in the right  sphenoid sinus.  No pneumocephalus or hydrocephalus.  IMPRESSION:  1.  No acute intra abnormality. 2.  Right sphenoid sinus disease.   Original Report Authenticated By: Holley Dexter, M.D.     Other results: EKG: 88 beats per minute, normal sinus rhythm  Assessment & Plan by Problem:  Principal Problem:   Hypoglycemia Active Problems:   Diabetes mellitus type 1, uncontrolled, insulin dependent   Tracheostomy dependence  Patient is a 47 year old female with past medical history as noted above who was admitted with yet another episode of hypoglycemia most likely induced by inappropriate use of insulin. Patient is currently on 5 units of Lantus daily. Although it has been noted that patient is insulin-dependent but given several episodes of hypoglycemia it is very difficult to manage her type 1 diabetes with insulin. It has been indicated multiple times on several previous admissions but patient has not been compliant with the recommendations. It has been clearly documented with multiple previous admissions for life-threatening hypoglycemia that patient is incapable of administering her medications in a safe way. This is also a concern with the patient is competent to manage her own disease. I will call psychiatry to comment on the capacity of the patient to take care of herself.  I seriously doubt of her ability to care for herself at this time although she lives with her daughter and boyfriend. Patient seems medically stable to be discharged at this time but would need some type of assisted living facility.  I suggest following instructions for a safe discharge. Patient was also seen by Dr. Rogelia [name] who was coordinating the multidisciplinary meeting this morning.   Nicole Mage MD Faculty-Internal Medicine Residency Program

## 2012-10-28 NOTE — Progress Notes (Signed)
PT was given her meal coverage but didn't eat much of her meal. CBG now low

## 2012-10-28 NOTE — Progress Notes (Signed)
Visit to patient while in hospital. [name] was at her bedside. Discussed carb food groups and suggested she try to minimize her intake of these foods, particularly when her blood sugar is high. Note widely fluctuating blood sugar on carb mod medium diet and 5 units lantus daily at 10 AM. Consider modified(~130 grams a day) carb diet to decrease impact on blood sugars and include supplements in this. Patient may benefit from lantus twice daily.   Will follow up tomorrow to continue education and will arrange follow up and call after she is discharged to assist with transition of care.

## 2012-10-28 NOTE — Progress Notes (Signed)
Myself, [name], [name], [name] and [name] meet this AM for [employer]. [facility] was not able to attend secondary to a last minute time change.   Afterwards I meet with [name], her mother, [name] (she refers to him as her caretaker / husband), her minister, and her nephew.   MENTAL HEALTH  We discussed that her mental health issues are likely playing into her poor control. She went to [facility] on 4/11 to start paperwork and has return appt on Tues. She has stopped her Respiradol herself several months ago bc she did not like the way it made her feel. She is on Depakote. I am not using this for a medical condition and was part of the reason I had referred her back to Mental Health. 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 I found no h/o SZS in chart review. It is hard to get great details from [name] but it seems that she uses the word szs for shaking that might be assoc with just her hypoglycemia.   She is willing to see pysch here in the hospital. I did not discuss the following with her. Pysch referral is also needed (in addition to clarification of her mental dx and treatment / need for depakote) for competency. We have all felt that she is competent to make her own decisions even if those decisions are poor ones. However, with repeated hypoglycemia and the stridor in November 2013, maybe she lost more mental capability than we realize. If she is declared incompetent to make mental decisions, then we could send her to SNF without the ability to sign herself out like she has done in the past. However, I am concerned that she will not be found incompetent.   TRACH  She does NOT have in the trach. She and [name] state that she had it at last D/C. They took it out to clean it and before they could reinsert it, the hole had closed up. When I spoke to [name] on the 11th, he had stated that she would need it for life likely as she had vocal cord paralysis and only cause they could find was  the crack. Therefore, I am advising that [name] be consulted this admission.   HYPOGLYCEMIA  Pt doesn't know why she gets hypoglycemic. I educated family that this kills and kills quickly. I educated that we stopped the Humalog to try to prevent hypoglycemia and that we currently were only using Lantus. I educated that once we can prevent hypoglycemia, that we will then focus on preventing highs.   [name] stated that she can walk long distances and be fine but when they come home, her CBG drops low. I will ask [name] to advise [name] and [name] about eating during exercise.   NUTRITION  Pt is adamant that she eats. But then she accused her mother of accusing her of stealing her food. She states she cannot eat rice or corn. She can eat chicken and mashed potatoes. She did have dysphagia when she had the trach but not since she has taken it out. I would advise calorie [name] while in hospital. I will hold on on speech eval presently.   DISPO PLANS  Pt states that "everyone" wants to lock her up / get rid of her. States that nursing homes do not manage her DM well. But states that if she needs to, she would consider SNF at D/C and like the last SNF well enough.   PATIENT STATEMENTS  She claims her mother "left her for dead" during the 11/21/2013admission when she was on life support. She states her mother left her twin brother "for dead". She states "everyone wants to lock her up", "take her money", she stated that EMS and other people would rifle through her purse when she had hypoglycemia and they didn't realize that she saw this. She stated that a man or men would inject her with insulin even though her CBG was low bc they wanted to have sex with her. She stated that "everyone" accuses her of being a prostitute, running around, doing wrong things...    INPATIENT RECS  Keep another day or two  Pysch consult  [name] consult  Calorie count  [name] to educate - not inpt team as need to keep  consistency  Cont to talk about SNF and appropriate dispo plans    OUTPATIENT WORK  Ask [name] about disability as if she has Medicare, would do [facility]  Ask [name] about aide - did she refuse??? Ensure communicating btw pysch and IM  Ask [name] about role of neuro pysch testing    IF and only if she is competent and refuses SNF, we will try to arrange for [name] to come to [facility] every day for several weeks and stay for 6-8 hours. We would admin insulin, follow CBG's, trach care (if still has), feed here, and educate. I hope that with this intensive plan that she would no longer be a disaster anytime she comes into [facility] for a routine appt.

## 2012-10-28 NOTE — Progress Notes (Signed)
INTERNAL MEDICINE TEACHING ATTENDING ADDENDUM - Inez Catalina, MD: I reviewed with the resident Dr. Saralyn Pilar, Ms. Fritchman's  medical history, physical examination, diagnosis and results of tests and treatment and I agree with the patient's care as documented.

## 2012-10-28 NOTE — Progress Notes (Signed)
Inpatient Diabetes Program Recommendations  AACE/ADA: New Consensus Statement on Inpatient Glycemic Control (2013)  Target Ranges:  Prepandial:   less than 140 mg/dL      Peak postprandial:   less than 180 mg/dL (1-2 hours)      Critically ill patients:  140 - 180 mg/dL   Reason for Assessment: MD consult.  Multiple admissions with hypoglycemia  Note:  This is patient's 8 th ED and 4 th hospital admission within the last 6 months.  Multiple disciplinary meeting noted this morning. "Mental health issues likely to be playing in to poor control."  Psychiatric consult in progress.    CBG in 400's before lunch.  Lantus 5 units daily restarted.  Correction insulin ordered beginning at 201 mg/dl.  Nothing to add at present.  Thank you.  Fahmida Jurich S. Elsie Lincoln, RN, CNS, CDE Inpatient Diabetes Program, team pager [phone]

## 2012-10-28 NOTE — Progress Notes (Signed)
Dr Virgina Organ notified of pt's CBG of 474, informed her of pt having had pancakes, bacon, sausage and grits at breakfast. Pt did not eat 100%, did note pt placing real sugar in her grits. Pt had a chocolate ensure this am before breakfast tray arrived, CBG this am was 75.   MD requested to recheck CBG again shortly, but to cover her yet with sliding scale.
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
## 2012-10-28 NOTE — Discharge Summary (Signed)
Internal Medicine Teaching [facility] Discharge Note  Name: [name] MRN: [id number] DOB: [birth year] 47 y.o.  Date of Admission: 10/27/2012 11:12 AM Date of Discharge: 11/01/2012 Attending Physician: Burns Spain, MD  Discharge Diagnosis: Principal Problem:   Hypoglycemia Active Problems:   Diabetes mellitus type 1, uncontrolled, insulin dependent   Tracheostomy dependence   Mental disorder  Pt was in agreement for transfer to SNF. [facility] agreed to cover a 30 day SNF stay. However, just prior to SNF transfer, she left AMA even before signing AMA papers.    Discharge Medications:   Medication List    TAKE these medications       ACCU-CHEK FASTCLIX LANCETS Misc  1 each by Does not apply route QID. Check blood sugar before meals and bedtime and as needed for symptoms of low blood sugar. Dx code 250.03 insulin dependent     albuterol (2.5 MG/3ML) 0.083% nebulizer solution  Commonly known as:  PROVENTIL  Take 3 mLs (2.5 mg total) by nebulization every 6 (six) hours as needed for wheezing.     albuterol 108 (90 BASE) MCG/ACT inhaler  Commonly known as:  PROVENTIL HFA;VENTOLIN HFA  Inhale 2 puffs into the lungs every 4 (four) hours as needed for wheezing or shortness of breath.     glucose 4 GM chewable tablet  Chew 4 tablets (16 g total) by mouth as needed for low blood sugar.     dextrose 40 % Gel  Commonly known as:  GLUTOSE  Take 1 Tube by mouth as needed (low blood sugar).     divalproex 125 MG DR tablet  Commonly known as:  DEPAKOTE  Take 1 tablet (125 mg total) by mouth daily.     feeding supplement Liqd  Take 237 mLs by mouth daily as needed (suboptimal oral intake).     ferrous sulfate 325 (65 FE) MG tablet  Take 325 mg by mouth daily with breakfast.     glucose blood test strip  Commonly known as:  ACCU-CHEK SMARTVIEW  Check blood sugar before meals and bedtime and as needed for symptoms of low blood sugar. Dx code 250.03 insulin dependent      insulin aspart 100 unit/ml Soln  Commonly known as:  novoLOG  Inject 2 Units into the skin 3 (three) times daily with meals. HOLD FOR EATING <50% OF MEAL     insulin glargine 100 UNIT/ML injection  Commonly known as:  LANTUS  Inject 0.04 mLs (4 Units total) into the skin at bedtime.     insulin glargine 100 UNIT/ML injection  Commonly known as:  LANTUS  Inject 0.05 mLs (5 Units total) into the skin every morning.     INSULIN SYRINGE .3CC/31GX5/16" 31G X 5/16" 0.3 ML Misc  DX Code: 250.03 Inject insulin once a day.     latanoprost 0.005 % ophthalmic solution  Commonly known as:  XALATAN  Place 1 drop into both eyes at bedtime.     multivitamin with minerals Tabs  Take 1 tablet by mouth daily.       Disposition and follow-up:   [name] was discharged from [facility] in Stable condition.  At the hospital follow up visit please address:  Frequently hypoglycemia in setting of poorly controlled DM1: concern for exogenous administration of insulin as well given multiple hospital admissions and hypoglycemic episodes? Please monitor and try to prevent any extra administration of insulin by patient. Insulin vials were removed from belongings during hospital stay. Likely will benefit  from SNF and careful monitoring of blood glucose and insulin administration.   c-peptide <0.10, random insulin 3, AM Cortisol 4.8.  Insulin regimen adjusted prior to discharge and recommed Discharge on insulin regimen: Lantus 5 units in AM, Lantus 4 units in PM, and Novolog 2 units TID with meals but HOLD if eating <50% of meal.  Extreme caution for hypoglycemia and adjust as necessary.  Low carb modified diet.  Also recommend endocrinology follow up at SNF to be arranged.  Continue to follow up with PCP in [facility] and diabetes educator.   Mild hyponatremia: improving, consider repeat BMET as needed on follow up.   Hx of hypothyroidism: TSH level day of discharge resulted to be  elevated 5.939, increased from 2.106 in November 2013. Currently not on any medication at home. Please follow up and start medication as needed. Consider check free t4 further thyroid levels if necessary.  Trach dependence: site appears closed.  She claims site closed after last hospital discharge when she was cleaning it at home.  Currently no respiratory or dysphagia complaints and stable during this hospital course.  If stridor and respiratory status deteriorates, follow up with Dr. Pollyann Kennedy as soon as possible, will likely need to go back to OR for trach placement.  Follow up with PCP.   Psychiatry follow up advised: un-diagnosed mental disorder.  Use to follow up at [facility]. Is on Depakote.  Consider referral to outpatient psychiatry.      Follow-up Appointments: Follow-up Information   Schedule an appointment as soon as possible for a visit with Blanch Media, MD. (As needed; they will also call you to arrange follow up)    Contact information:   [address] [place] [address] [phone]       Follow up with Plyler, [name], RD. (As needed)    Contact information:   [address] [place] [address] [phone]      Discharge Orders   Future Appointments Provider Department Dept Phone   11/08/2012 8:15 AM Allie Bossier, MD [facility] [phone]   Future Orders Complete By Expires     Call MD for:  persistant nausea and vomiting  As directed     Call MD for:  persistant nausea and vomiting  As directed     Call MD for:  As directed     Comments:      Symptomatic hypoglycemia    Call MD for:  As directed     Comments:      Persistent hypoglycemia    Diet - low sodium heart healthy  As directed     Diet - low sodium heart healthy  As directed     Increase activity slowly  As directed     Increase activity slowly  As directed       Procedures Performed:  Ct Head Wo Contrast  10/27/2012  *RADIOLOGY REPORT*  Clinical Data: Altered mental  status.  Hypoglycemia.  CT HEAD WITHOUT CONTRAST  Technique:  Contiguous axial images were obtained from the base of the skull through the vertex without contrast.  Comparison: Head CT scan 11/14/2011.  Findings: No evidence of acute intracranial abnormality including infarct, hemorrhage, mass lesion, mass effect, midline shift or abnormal extra-axial fluid collection is identified.  The calvarium is intact.  There is mucosal thickening in the right sphenoid sinus.  No pneumocephalus or hydrocephalus.  IMPRESSION:  1.  No acute intra abnormality. 2.  Right sphenoid sinus disease.   Original Report Authenticated  By: Holley Dexter, M.D.    Dg Chest Portable 1 View  10/11/2012  *RADIOLOGY REPORT*  Clinical Data: Shortness of breath and cough  PORTABLE CHEST - 1 VIEW  Comparison: 08/09/2012  Findings: The tracheostomy is again noted.  Cardiac shadow is stable.  The lungs are clear bilaterally.  IMPRESSION: No acute intrathoracic abnormality.   Original Report Authenticated By: Alcide Clever, M.D.    Admission HPI: Ms. Tapia is a 47 year old lady with a history of type 1 diabetes, chronic tracheostomy, and multiple ED visits for hypoglycemia presenting with the same. This morning, she began to feel weak and sweaty so she checked her blood sugar and found it to be low. She took 3 glucose tabs and called the EMS who reported a blood glucose level of 19 on their arrival. They gave her dextrose and brought her to the ED. Patient states that her last administration of Lantus was the day prior to admission at 10 AM. She did not eat breakfast this morning. She denies using any other insulin and states that she is supposed to take 5 units of Lantus every morning and takes it as prescribed. She does, however, also states that she takes 30 cc's of insulin.  Patient was recently discharged from the hospital with recurrent episodes of hypoglycemia, suspected to be caused by self injection with insulin above the recommended  and prescribed dose.   Hospital Course by problem list:  Hypoglycemia in setting of poorly controlled DM1--multiple episodes and hospital visits and admissions.  Likely secondary to misuse of insulin although she denies extra dosing of insulin.  During this hospitalization, she was maintained on hypoglycemic protocol. CBG was reported to be 19 on EMS arrival and improved to 75 at time of discharge.  She claims she only took 5 units of Lantus every morning since hospital discharge but does claim she checks her blood glucose level every 2 hours at home.  She denies alcohol intake or recent drug use. Afebrile with mild leukocytosis on admission 11.1 with 8.8 nuetrophil #, possibly reactive in setting of hypoglycemia stress.  Other causes of hypoglycemia are also considered including poor by mouth intake, insulinoma (less likely due to low blood insulin level), or insulin autoimmune hypoglycemia.  LFTs wnl prior admission.  It is very clear that she has poor insight into her disease and its management despite multiple attempts from multiple providers to help her. She appears competent, makes decisions for herself, is alert and oriented, and denies any suicidal or homicidal ideation. She claims her mother is not her primary caregiver and that it is actually her long-term boyfriend, [name]; however, they both live at home with the mother.  Again, the fear for Ms. Ventress and these episodes is that if they continue, she will eventually end up dying from this disease and her poor management. We have re-iterated this concern to her SEVERAL times and her PCP has now arranged for a multidisciplinary conference to discuss her in complete detail along with family members this morning 10/28/12. Our hope is that she will be able to take control and better manage her diabetes, maintaining herself appropriately out of the hospital. It seems, that SNF would likely be more appropriate and safe for patient, however, she she is  apparently not able to be accepted to a SNF at this time but we will continue to try as only safe discharge option at this time. She also does not meet criteria for [facility] as she appears independent in her  activities and is no longer trach dependent at this time.  Teodora Medici from personal care services did however reach out to [facility] during hospital course and will see if she qualifies for their services at home to assist with insulin, eating, and self monitoring to help stabilize blood sugars upon discharge.  Follow up with PC and hopefully endocrinology.    On prior admission, TSH was elevated 5.939 on no thyroid related medications, insulin level random 3, C peptide <0.10, cortisol level AM 4.8 and these values will need to be addressed on hospital follow up. During this admission, her hypoglycemic episodes resolved, however, she then started having hyperglycemia with symptoms of polyuria.  As such, her Lantus was adjusted to BID dosing, 3 units in AM and 5 units in PM along with customized meal coverage insulin plan starting with CBGs >250 and then adjusted again on day of discharge.  Recommended regimen for discharge includes: Lantus 5 units in AM (10AM), Lantus 4 units in PM, and Novolog 2 units TID with meals BUT HOLD if eating <50% of meal and to follow a LOW CARB Modified diet. She has been instructed to eat when blood sugar is low and drink water when blood sugar is high. And will need to follow up with pcp and diabetic educator in opc and will also need endocrinology follow up as well as soon as possible.   Tracheostomy dependence--trach care maintained by pt and respiratory as needed in the past. Currently, s/p removal of trach all weekend, claims she was cleaning it over the weekend and when she went to put back in her site was closed. Currently during admission talking and eating well, no complaints of dysphagia, says she is used to her voice being this way. discussed with Dr.  Pollyann Kennedy on the telephone during this admission-- recommended to monitor closely for now, maybe she can get by without it, however, if respiratory status changes, stridor present, will need to contact him and take back to OR.  Respiratory status stable at this time with no complaints.  If complications develop, will need to follow up with Dr. Pollyann Kennedy as soon as possible.     Hyponatremia--BMET monitored during admission. Na 134 on admission.  Tolerating diet well. Alert and oriented x3. Follow up bmet advised on hospital follow up visit with pcp as necessary.   Hx of hypothyroidism: TSH on 4/10 found to be elevated 5.9. Not on any thyroid medication at this time. Will need close follow up with PCP, likely will need recheck of free T4 and thyroid levels, consider treatment as appropriate.   Discharge Vitals:  BP 118/77  Pulse 87  Temp(Src) 98.2 F (36.8 C) (Oral)  Resp 16  Ht 5' 0.1" (1.527 m)  Wt 100 lb 5 oz (45.5 kg)  BMI 19.51 kg/m2  SpO2 99%  LMP 10/27/2012  Discharge Labs:  Results for orders placed during the hospital encounter of 10/27/12 (from the past 24 hour(s))  GLUCOSE, CAPILLARY     Status: Abnormal   Collection Time    10/31/12  5:12 PM      Result Value Range   Glucose-Capillary 359 (*) 70 - 99 mg/dL  GLUCOSE, CAPILLARY     Status: Abnormal   Collection Time    10/31/12  9:42 PM      Result Value Range   Glucose-Capillary 173 (*) 70 - 99 mg/dL   Comment 1 Documented in Chart     Comment 2 Notify RN    GLUCOSE, CAPILLARY  Status: Abnormal   Collection Time    11/01/12  1:52 AM      Result Value Range   Glucose-Capillary 242 (*) 70 - 99 mg/dL  GLUCOSE, CAPILLARY     Status: Abnormal   Collection Time    11/01/12  6:43 AM      Result Value Range   Glucose-Capillary 190 (*) 70 - 99 mg/dL  GLUCOSE, CAPILLARY     Status: Abnormal   Collection Time    11/01/12  7:32 AM      Result Value Range   Glucose-Capillary 240 (*) 70 - 99 mg/dL  GLUCOSE, CAPILLARY     Status:  Abnormal   Collection Time    11/01/12 12:25 PM      Result Value Range   Glucose-Capillary 479 (*) 70 - 99 mg/dL  GLUCOSE, CAPILLARY     Status: Abnormal   Collection Time    11/01/12  2:38 PM      Result Value Range   Glucose-Capillary 388 (*) 70 - 99 mg/dL   Basic Metabolic Panel:  Recent Labs Lab 10/27/12 1158 10/28/12 0538  NA 134* 134*  K 4.4 4.3  CL 100 98  CO2 25 28  GLUCOSE 251* 117*  BUN 17 17  CREATININE 0.54 0.60  CALCIUM 9.0 9.4   Liver Function Tests:  Recent Labs Lab 10/27/12 1158  AST 18  ALT 8  ALKPHOS 50  BILITOT 0.2*  PROT 7.8  ALBUMIN 3.2*    Recent Labs Lab 10/27/12 1158  LIPASE 12   CBC:  Recent Labs Lab 10/27/12 1158  WBC 11.1*  NEUTROABS 8.8*  HGB 11.8*  HCT 35.4*  MCV 61.9*  PLT 187   CBG:  Recent Labs Lab 10/31/12 2142 11/01/12 0152 11/01/12 0643 11/01/12 0732 11/01/12 1225 11/01/12 1438  GLUCAP 173* 242* 190* 240* 479* 388*   Urine Drug Screen: Drugs of Abuse     Component Value Date/Time   LABOPIA NONE DETECTED 10/27/2012 1156   COCAINSCRNUR NONE DETECTED 10/27/2012 1156   LABBENZ NONE DETECTED 10/27/2012 1156   AMPHETMU NONE DETECTED 10/27/2012 1156   THCU NONE DETECTED 10/27/2012 1156   LABBARB NONE DETECTED 10/27/2012 1156    Urinalysis:  Recent Labs Lab 10/27/12 1156  COLORURINE YELLOW  LABSPEC 1.015  PHURINE 8.5*  GLUCOSEU NEGATIVE  HGBUR NEGATIVE  BILIRUBINUR NEGATIVE  KETONESUR 15*  PROTEINUR NEGATIVE  UROBILINOGEN 0.2  NITRITE NEGATIVE  LEUKOCYTESUR NEGATIVE   Signed: [name] 11/01/2012, 2:59 PM   Time Spent on Discharge: 60 minutes Services Ordered on Discharge: d/c to SNF. Update : pt left AMA prior to being transferred to SNF. Personal care services evaluation when stable to return to home if possible Equipment Ordered on Discharge: none

## 2012-10-28 NOTE — Discharge Summary (Deleted)
Internal Medicine Teaching [facility] Discharge Note  Name: [name] MRN: [id number] DOB: Jul 03, 1966 47 y.o.  Date of Admission: 10/27/2012 11:12 AM Date of Discharge: 10/28/2012 Attending Physician: Nicole Mage, MD  Discharge Diagnosis: Principal Problem:   Hypoglycemia Active Problems:   Diabetes mellitus type 1, uncontrolled, insulin dependent   Tracheostomy dependence  Discharge Medications:   Medication List    TAKE these medications       ACCU-CHEK FASTCLIX LANCETS Misc  1 each by Does not apply route QID. Check blood sugar before meals and bedtime and as needed for symptoms of low blood sugar. Dx code 250.03 insulin dependent     albuterol (2.5 MG/3ML) 0.083% nebulizer solution  Commonly known as:  PROVENTIL  Take 3 mLs (2.5 mg total) by nebulization every 6 (six) hours as needed for wheezing.     albuterol 108 (90 BASE) MCG/ACT inhaler  Commonly known as:  PROVENTIL HFA;VENTOLIN HFA  Inhale 2 puffs into the lungs every 4 (four) hours as needed for wheezing or shortness of breath.     glucose 4 GM chewable tablet  Chew 4 tablets (16 g total) by mouth as needed for low blood sugar.     dextrose 40 % Gel  Commonly known as:  GLUTOSE  Take 1 Tube by mouth as needed (low blood sugar).     divalproex 125 MG DR tablet  Commonly known as:  DEPAKOTE  Take 1 tablet (125 mg total) by mouth daily.     feeding supplement Liqd  Take 237 mLs by mouth 3 (three) times daily between meals.     ferrous sulfate 325 (65 FE) MG tablet  Take 325 mg by mouth daily with breakfast.     glucose blood test strip  Commonly known as:  ACCU-CHEK SMARTVIEW  Check blood sugar before meals and bedtime and as needed for symptoms of low blood sugar. Dx code 250.03 insulin dependent     insulin glargine 100 UNIT/ML injection  Commonly known as:  LANTUS  Inject 0.05 mLs (5 Units total) into the skin at bedtime.     INSULIN SYRINGE .3CC/31GX5/16" 31G X 5/16" 0.3 ML Misc  DX Code:  250.03 Inject insulin once a day.     latanoprost 0.005 % ophthalmic solution  Commonly known as:  XALATAN  Place 1 drop into both eyes at bedtime.     multivitamin with minerals Tabs  Take 1 tablet by mouth daily.       Disposition and follow-up:   NicoleNicole [name] was discharged from [facility] in Stable condition.  At the hospital follow up visit please address:  Frequently hypoglycemia in setting of poorly controlled DM1: exogenous administration of insulin? D/c on Lantus 5 units QAM.  Likely will benefit from SNF. c-peptide <0.10, random insulin 3, AM Cortisol 4.8.   Mild hyponatremia: improving, consider repeat BMET as needed on follow up.   Hx of hypothyroidism: TSH level day of discharge resulted to be elevated 5.939, increased from 2.106 in November 2013. Currently not on any medication at home. Please follow up and start medication as needed. Consider check free t4.  Trach dependence: site appears closed, no passy miur valve in place, talking and claims she takes care of it at home.  Follow up with Dr. Erin [name] and PCP.   Follow-up Appointments:      Discharge Orders   Future Appointments Provider Department Dept Phone   10/28/2012 1:45 PM Nicole Nevins, MD [facility] [phone]  11/08/2012 8:15 AM Nicole Bossier, MD [facility] [phone]   Future Orders Complete By Expires     Call MD for:  persistant nausea and vomiting  As directed     Call MD for:  As directed     Comments:      Symptomatic hypoglycemia    Diet - low sodium heart healthy  As directed     Increase activity slowly  As directed       Procedures Performed:  Ct Head Wo Contrast  10/27/2012  *RADIOLOGY REPORT*  Clinical Data: Altered mental status.  Hypoglycemia.  CT HEAD WITHOUT CONTRAST  Technique:  Contiguous axial images were obtained from the base of the skull through the vertex without contrast.  Comparison: Head CT scan 11/14/2011.   Findings: No evidence of acute intracranial abnormality including infarct, hemorrhage, mass lesion, mass effect, midline shift or abnormal extra-axial fluid collection is identified.  The calvarium is intact.  There is mucosal thickening in the right sphenoid sinus.  No pneumocephalus or hydrocephalus.  IMPRESSION:  1.  No acute intra abnormality. 2.  Right sphenoid sinus disease.   Original Report Authenticated By: Holley Dexter, M.D.    Dg Chest Portable 1 View  10/11/2012  *RADIOLOGY REPORT*  Clinical Data: Shortness of breath and cough  PORTABLE CHEST - 1 VIEW  Comparison: 08/09/2012  Findings: The tracheostomy is again noted.  Cardiac shadow is stable.  The lungs are clear bilaterally.  IMPRESSION: No acute intrathoracic abnormality.   Original Report Authenticated By: Alcide Clever, M.D.    Admission HPI: [name] is a 47 year old lady with a history of type 1 diabetes, chronic tracheostomy, and multiple ED visits for hypoglycemia presenting with the same. This morning, she began to feel weak and sweaty so she checked her blood sugar and found it to be low. She took 3 glucose tabs and called the EMS who reported a blood glucose level of 19 on their arrival. They gave her dextrose and brought her to the ED. Patient states that her last administration of Lantus was the day prior to admission at 10 AM. She did not eat breakfast this morning. She denies using any other insulin and states that she is supposed to take 5 units of Lantus every morning and takes it as prescribed. She does, however, also states that she takes 30 cc's of insulin.  Patient was recently discharged from the hospital with recurrent episodes of hypoglycemia, suspected to be caused by self injection with insulin above the recommended and prescribed dose.  Of note, patient is scheduled for a multidisciplinary meeting at [facility] outpatient clinic tomorrow afternoon.  ROS + for cough and some mucus production from trach  site.  Hospital Course by problem list: Hypoglycemia in setting of poorly controlled DM1--multiple episodes and hospital visits and admissions.  Likely secondary to misuse of insulin although she denies extra dosing of insulin.  During this hospitalization, she was maintained on hypoglycemic protocol. CBG was reported to be 19 on EMS arrival and improved to 75 at time of discharge.  She claims she only took 5 units of Lantus every morning since hospital discharge but does claim she checks her blood glucose level every 2 hours at home.  She denies alcohol intake or recent drug use. Afebrile with mild leukocytosis on admission 11.1 with 8.8 nuetrophil #, possibly reactive in setting of hypoglycemia stress.  Other causes of hypoglycemia are also considered including poor by mouth intake, insulinoma (less likely due to low  blood insulin level), or insulin autoimmune hypoglycemia.  LFTs wnl prior admission.  It is very clear that she has poor insight into her disease and its management despite multiple attempts from multiple providers to help her. She appears competent, makes decisions for herself, is alert and oriented, and denies any suicidal or homicidal ideation. She claims her mother is not her primary caregiver and that it is actually her long-term boyfriend, [name]; however, they both live at home with the mother.  Again, the fear for Ms. Deren and these episodes is that if they continue, she will eventually end up dying from this disease and her poor management. We have re-iterated this concern to her SEVERAL times and her PCP has now arranged for a multidisciplinary conference to discuss her in complete detail along with family members this morning 10/28/12. Our hope is that she will be able to take control and better manage her diabetes, maintaining herself appropriately out of the hospital. It seems, that SNF would likely be more appropriate and safe for patient, however, she currently is hesitant for SNF.  This will likely be addressed today with her conference with PCP and social work. Follow up with PCP.   On prior admission, TSH was elevated 5.939 on no thyroid related medications, insulin level random 3, C peptide <0.10, cortisol level AM 4.8 and these values will need to be addressed on hospital follow up. She has been Instructed to take only 5 units of LANTUS at 10am daily, eat when blood sugar is low and drink water when blood sugar is high. And will need to follow up with pcp and diabetic educator in opc.   Tracheostomy dependence--trach care maintained by pt and respiratory as needed. Currently, no passy muir valve in place this admission.  Follows with Dr. Pollyann Kennedy.    Hyponatremia--BMET monitored during admission. Na 134 on admission.  Tolerating diet well. Alert and oriented x3. Follow up bmet advised on hospital follow up visit with pcp as necessary.   Hx of hypothyroidism: TSH on 4/10 found to be elevated 5.9. Not on any thyroid medication at this time. Will need close follow up with PCP and consider treatment as appropriate.   Discharge Vitals:  BP 123/85  Pulse 83  Temp(Src) 98.1 F (36.7 C) (Oral)  Resp 18  Ht 5' 0.1" (1.527 m)  Wt 100 lb 5 oz (45.5 kg)  BMI 19.51 kg/m2  SpO2 99%  LMP 10/27/2012  Discharge Labs:  Results for orders placed during the hospital encounter of 10/27/12 (from the past 24 hour(s))  URINALYSIS, ROUTINE W REFLEX MICROSCOPIC     Status: Abnormal   Collection Time    10/27/12 11:56 AM      Result Value Range   Color, Urine YELLOW  YELLOW   APPearance CLEAR  CLEAR   Specific Gravity, Urine 1.015  1.005 - 1.030   pH 8.5 (*) 5.0 - 8.0   Glucose, UA NEGATIVE  NEGATIVE mg/dL   Hgb urine dipstick NEGATIVE  NEGATIVE   Bilirubin Urine NEGATIVE  NEGATIVE   Ketones, ur 15 (*) NEGATIVE mg/dL   Protein, ur NEGATIVE  NEGATIVE mg/dL   Urobilinogen, UA 0.2  0.0 - 1.0 mg/dL   Nitrite NEGATIVE  NEGATIVE   Leukocytes, UA NEGATIVE  NEGATIVE  URINE RAPID DRUG  SCREEN (HOSP PERFORMED)     Status: None   Collection Time    10/27/12 11:56 AM      Result Value Range   Opiates NONE DETECTED  NONE DETECTED  Cocaine NONE DETECTED  NONE DETECTED   Benzodiazepines NONE DETECTED  NONE DETECTED   Amphetamines NONE DETECTED  NONE DETECTED   Tetrahydrocannabinol NONE DETECTED  NONE DETECTED   Barbiturates NONE DETECTED  NONE DETECTED  CBC WITH DIFFERENTIAL     Status: Abnormal   Collection Time    10/27/12 11:58 AM      Result Value Range   WBC 11.1 (*) 4.0 - 10.5 K/uL   RBC 5.72 (*) 3.87 - 5.11 MIL/uL   Hemoglobin 11.8 (*) 12.0 - 15.0 g/dL   HCT 96.2 (*) 95.2 - 84.1 %   MCV 61.9 (*) 78.0 - 100.0 fL   MCH 20.6 (*) 26.0 - 34.0 pg   MCHC 33.3  30.0 - 36.0 g/dL   RDW 32.4 (*) 40.1 - 02.7 %   Platelets 187  150 - 400 K/uL   Neutrophils Relative 79 (*) 43 - 77 %   Neutro Abs 8.8 (*) 1.7 - 7.7 K/uL   Lymphocytes Relative 16  12 - 46 %   Lymphs Abs 1.7  0.7 - 4.0 K/uL   Monocytes Relative 5  3 - 12 %   Monocytes Absolute 0.6  0.1 - 1.0 K/uL   Eosinophils Relative 0  0 - 5 %   Eosinophils Absolute 0.0  0.0 - 0.7 K/uL   Basophils Relative 0  0 - 1 %   Basophils Absolute 0.0  0.0 - 0.1 K/uL  COMPREHENSIVE METABOLIC PANEL     Status: Abnormal   Collection Time    10/27/12 11:58 AM      Result Value Range   Sodium 134 (*) 135 - 145 mEq/L   Potassium 4.4  3.5 - 5.1 mEq/L   Chloride 100  96 - 112 mEq/L   CO2 25  19 - 32 mEq/L   Glucose, Bld 251 (*) 70 - 99 mg/dL   BUN 17  6 - 23 mg/dL   Creatinine, Ser 2.53  0.50 - 1.10 mg/dL   Calcium 9.0  8.4 - 66.4 mg/dL   Total Protein 7.8  6.0 - 8.3 g/dL   Albumin 3.2 (*) 3.5 - 5.2 g/dL   AST 18  0 - 37 U/L   ALT 8  0 - 35 U/L   Alkaline Phosphatase 50  39 - 117 U/L   Total Bilirubin 0.2 (*) 0.3 - 1.2 mg/dL   GFR calc non Af Amer >90  >90 mL/min   GFR calc Af Amer >90  >90 mL/min  LIPASE, BLOOD     Status: None   Collection Time    10/27/12 11:58 AM      Result Value Range   Lipase 12  11 - 59 U/L   POCT I-STAT TROPONIN I     Status: None   Collection Time    10/27/12 12:27 PM      Result Value Range   Troponin i, poc 0.00  0.00 - 0.08 ng/mL   Comment 3           GLUCOSE, CAPILLARY     Status: Abnormal   Collection Time    10/27/12  4:11 PM      Result Value Range   Glucose-Capillary 276 (*) 70 - 99 mg/dL  GLUCOSE, CAPILLARY     Status: Abnormal   Collection Time    10/27/12  5:18 PM      Result Value Range   Glucose-Capillary 275 (*) 70 - 99 mg/dL  GLUCOSE, CAPILLARY     Status: Abnormal  Collection Time    10/27/12  9:49 PM      Result Value Range   Glucose-Capillary 170 (*) 70 - 99 mg/dL  GLUCOSE, CAPILLARY     Status: Abnormal   Collection Time    10/28/12  1:39 AM      Result Value Range   Glucose-Capillary 258 (*) 70 - 99 mg/dL  BASIC METABOLIC PANEL     Status: Abnormal   Collection Time    10/28/12  5:38 AM      Result Value Range   Sodium 134 (*) 135 - 145 mEq/L   Potassium 4.3  3.5 - 5.1 mEq/L   Chloride 98  96 - 112 mEq/L   CO2 28  19 - 32 mEq/L   Glucose, Bld 117 (*) 70 - 99 mg/dL   BUN 17  6 - 23 mg/dL   Creatinine, Ser 1.61  0.50 - 1.10 mg/dL   Calcium 9.4  8.4 - 09.6 mg/dL   GFR calc non Af Amer >90  >90 mL/min   GFR calc Af Amer >90  >90 mL/min  GLUCOSE, CAPILLARY     Status: None   Collection Time    10/28/12  7:46 AM      Result Value Range   Glucose-Capillary 75  70 - 99 mg/dL   Signed: [name] 10/28/2012, 8:23 AM   Time Spent on Discharge: 35 minutes Services Ordered on Discharge: d/c to [facility] for multidisciplinary meeting this AM Equipment Ordered on Discharge: none
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
## 2012-10-28 NOTE — Progress Notes (Signed)
Pt was given orange juice, peanut butter and graham crackers to eat for her low cbg.
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
## 2012-10-28 NOTE — Progress Notes (Signed)
Pt refuses to be put on a bed alarm 

## 2012-10-28 NOTE — Progress Notes (Signed)
[facility]  Patient Status: New  [facility] is providing the following services: RN - [facility] has been referred to see this pt. Has not yet been seen due to multiple trips to the hospital, not being able to reach pt or family member by phone  If patient discharges after hours, please call [phone].   Jodene Nam  [phone] 10/28/2012, 12:35 PM

## 2012-10-28 NOTE — Progress Notes (Signed)
Dr Virgina Organ requested nurse to remove pt's insulin from her room/purse.  Explained to pt the doctor's request, pt was cooperative and gave the nurse her bottle of lantus insulin. Pt kept her bag to herself and was careful not to allow nurse to see over into her belongings., acted very Information systems manager.  Pt denies having any other meds or insulins.  At this time the lantus bottle was placed in a bag with pt's label and placed in pt's med drawer.  (this bottle of lantus has dried black ink all over the botlle).

## 2012-10-28 NOTE — Progress Notes (Signed)
Hypoglycemic Event  CBG: 64  Treatment: 15 GM carbohydrate snack  Symptoms: None  Follow-up CBG: Time: 2200 CBG Result: 100  Possible Reasons for Event: Inadequate meal intake  Comments/MD notified: pt was refusing to drink her orange juice. Pt would only eat graham crackers and peanut butter which didn't help her blood sugar to go up. Her CBG actually went down. Pt was also refusing to allow the RN to recheck her CBG.     Samya Siciliano S  Remember to initiate Hypoglycemia Order Set & complete

## 2012-10-28 NOTE — Progress Notes (Signed)
Hypoglycemic Event  CBG: 65  Treatment: 15 GM carbohydrate snack  Symptoms: None  Follow-up CBG: Time:2108 CBG Result:64  Possible Reasons for Event: Inadequate meal intake  Comments/MD notified:pt refuses to drink her orange juice. Pt will only eat graham crackers and peanut butter which hasn't worked for her. Blood sugar actually went down instead of up after eating the graham crackers and peanut butter. Pt will not take any other form of sugar.     Tatsuo Musial S  Remember to initiate Hypoglycemia Order Set & complete

## 2012-10-29 ENCOUNTER — Telehealth: Payer: Self-pay | Admitting: Dietician

## 2012-10-29 LAB — GLUCOSE, CAPILLARY
Glucose-Capillary: 121 mg/dL — ABNORMAL HIGH (ref 70–99)
Glucose-Capillary: 324 mg/dL — ABNORMAL HIGH (ref 70–99)

## 2012-10-29 MED ORDER — INSULIN ASPART 100 UNIT/ML ~~LOC~~ SOLN
0.0000 [IU] | Freq: Three times a day (TID) | SUBCUTANEOUS | Status: DC
  Administered 2012-10-29: 3 [IU] via SUBCUTANEOUS
  Administered 2012-10-30: 2 [IU] via SUBCUTANEOUS

## 2012-10-29 NOTE — Progress Notes (Signed)
Calorie Count Note:   RD following up on calorie count that was started yesterday. No meals have been documented at this time. Envelope was made and RN will start documenting with breakfast this morning. RD observed pt ate ~80% of breakfast tray at time of visit, and was continuing to eat.   Noted: pt is not on a carb modified diet at this time, recommend change diet to Carb Mod Low with 2gm Na restriction for better carbohydrate control.   RD will follow up tomorrow with calorie count results.   Clarene Duke RD, LDN Pager [phone] After Hours pager [phone]

## 2012-10-29 NOTE — Telephone Encounter (Signed)
Addressed with her in patient today.

## 2012-10-29 NOTE — Progress Notes (Signed)
Internal Medicine Teaching Service Attending Note Date: 10/29/2012  Patient name: [name]  Medical record number: [id number]  Date of birth: 1965/11/14    This patient has been seen and discussed with the house staff. Please see their note for complete details. I concur with their findings with the following additions/corrections: Awaiting patient to be transferred to skilled nursing facility. Plan as per discharge summary.  Lars Mage 10/29/2012, 3:49 PM

## 2012-10-29 NOTE — Progress Notes (Signed)
Consult received. Have followed this patient multiple admissions. Noted plan to discharge pt to SNF.  Nothing to add. Consult complete. Thank you, Lenor Coffin, RN, CNS, Diabetes Coordinator [phone])

## 2012-10-29 NOTE — Progress Notes (Addendum)
Subjective: [name] was seen and examined this morning.  Evaluated by Psychiatry yesterday who determined her to have capacity but lacking organization skills.  D/C to SNF has been agreed upon and she is requesting [facility] facility.  She is noted to have hyperglycemia yesterday afternoon with CBGs >450 and then hypoglycemia yesterday evening after receiving meal coverage insulin.  She then was refusing to drink juice, have an IV placed, and even refusing dextrose gel at that time.  [name] appears very comfortable in the hospital.  Her airway remains patent at this time without complaints with a closed trach site.  She denies any dysphagia, fever, chills, N/V/D, abdominal pain, chest pain, or shortness of breath at this time.  She claims she is used to her voice and talking this way and does not wish for trach to be back in because she feels she has been healed with the grace of God.     Objective: Vital signs in last 24 hours: Filed Vitals:   10/28/12 0500 10/28/12 1353 10/28/12 2108 10/29/12 0529  BP: 123/85 106/72 126/76 137/80  Pulse: 83 72 111 94  Temp: 98.1 F (36.7 C) 98.3 F (36.8 C) 98.2 F (36.8 C) 97.9 F (36.6 C)  TempSrc: Oral Oral  Oral  Resp: 18 20 19 16   Height:      Weight:      SpO2: 99% 95% 100% 100%   Vitals reviewed. General: resting in bed, NAD, raspy voice HEENT: PERRL, EOMI, trach site appears closed, no blood or mucus around site  Cardiovascular: Regular rate and rhythm, no murmurs, rubs or gallops  Respiratory: b/l rhonchi Abdomen: Soft, nondistended, nontender, bowel sounds present  Extremities: Warm and well-perfused, no edema. Moving all 4 extremities.  Skin: Warm, dry, no rashes  Psych : childlike affect and thanks God often.  Neuro: Alert and oriented, responsive, follows commands  Lab Results: Basic Metabolic Panel:  Recent Labs Lab 10/24/12 0256 10/27/12 1158 10/28/12 0538  NA  --  134* 134*  K  --  4.4 4.3  CL  --  100 98  CO2  --   25 28  GLUCOSE  --  251* 117*  BUN  --  17 17  CREATININE  --  0.54 0.60  CALCIUM  --  9.0 9.4  MG 2.1  --   --   PHOS 3.3  --   --    Liver Function Tests:  Recent Labs Lab 10/24/12 0256 10/27/12 1158  AST 20 18  ALT 9 8  ALKPHOS 56 50  BILITOT 0.3 0.2*  PROT 8.1 7.8  ALBUMIN 3.5 3.2*    Recent Labs Lab 10/27/12 1158  LIPASE 12   CBC:  Recent Labs Lab 10/23/12 1857 10/24/12 0222 10/27/12 1158  WBC 5.3 7.1 11.1*  NEUTROABS 3.1  --  8.8*  HGB 12.3 11.2* 11.8*  HCT 37.5 33.9* 35.4*  MCV 61.9* 61.9* 61.9*  PLT 211 210 187   CBG:  Recent Labs Lab 10/28/12 1244 10/28/12 1659 10/28/12 2102 10/28/12 2136 10/28/12 2200 10/29/12 0746  GLUCAP 455* 342* 65* 64* 100* 121*   Thyroid Function Tests:  Recent Labs Lab 10/24/12 0256  TSH 5.939*   Urine Drug Screen: Drugs of Abuse     Component Value Date/Time   LABOPIA NONE DETECTED 10/27/2012 1156   COCAINSCRNUR NONE DETECTED 10/27/2012 1156   LABBENZ NONE DETECTED 10/27/2012 1156   AMPHETMU NONE DETECTED 10/27/2012 1156   THCU NONE DETECTED 10/27/2012 1156   LABBARB  NONE DETECTED 10/27/2012 1156    Urinalysis:  Recent Labs Lab 10/27/12 1156  COLORURINE YELLOW  LABSPEC 1.015  PHURINE 8.5*  GLUCOSEU NEGATIVE  HGBUR NEGATIVE  BILIRUBINUR NEGATIVE  KETONESUR 15*  PROTEINUR NEGATIVE  UROBILINOGEN 0.2  NITRITE NEGATIVE  LEUKOCYTESUR NEGATIVE   Studies/Results: Ct Head Wo Contrast  10/27/2012  *RADIOLOGY REPORT*  Clinical Data: Altered mental status.  Hypoglycemia.  CT HEAD WITHOUT CONTRAST  Technique:  Contiguous axial images were obtained from the base of the skull through the vertex without contrast.  Comparison: Head CT scan 11/14/2011.  Findings: No evidence of acute intracranial abnormality including infarct, hemorrhage, mass lesion, mass effect, midline shift or abnormal extra-axial fluid collection is identified.  The calvarium is intact.  There is mucosal thickening in the right sphenoid sinus.   No pneumocephalus or hydrocephalus.  IMPRESSION:  1.  No acute intra abnormality. 2.  Right sphenoid sinus disease.   Original Report Authenticated By: Holley Dexter, M.D.    Medications: I have reviewed the patient's current medications. Scheduled Meds: . divalproex  125 mg Oral Daily  . enoxaparin (LOVENOX) injection  40 mg Subcutaneous Q24H  . ferrous sulfate  325 mg Oral Q breakfast  . insulin aspart  0-4 Units Subcutaneous TID WC  . insulin glargine  5 Units Subcutaneous QHS  . latanoprost  1 drop Both Eyes QHS  . multivitamin with minerals  1 tablet Oral Daily   Continuous Infusions:  PRN Meds:.acetaminophen, acetaminophen, albuterol, dextrose, feeding supplement  Assessment/Plan: 47 y.o female with poorly controlled type 1 DM who presents to the ED with hypoglycemia with history of multiple similar episodes leading to admission.   Hypoglycemia in setting of insulin use/misuse--history of multiple episodes of hypoglycemia with ED visits and hospitalizations. Most likely secondary to exogenous and improper insulin use. Other causes of hypoglycemia were also considered including poor PO intake, insulinoma (less likely due to low blood insulin level). Insulin autoimmune hypoglycemia may a possible etiology as well.  She was recently discharged from the hospital on 4/11 with 5u lantus QAM without a sliding scale. Her last C-peptide level was less than 0.10 on 4/10 and her random blood insulin level was 3 during the last admission.  -had multidisciplinary meeting with PCP yesterday in room where here mental health, trach, and hypoglycemia was addressed with plans for both inpatient and outpatient folow up -continue lantus 5u daily -CBGs monitoring -hypoglycemic protocol  -consult social work for SNF placement  Diabetes mellitus type 1, uncontrolled, insulin dependent  Last Hgb A1C was 10.2 in February 2014. Her most recent office visit shows that her mealtime coverage was discontinued  in setting of hypoglycemic events.  -cont 5u lantus daily as above  -customized meal time coverage to start if CBGs >300 -monitor closely for exogenous administration--insulin vial removed by RN yesterday from personal belongings  Mental Disorder--unidentified.  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  Used to follow at mental health but does not anymore.  Has been on Risperdal in the past per pcp.  Hx of crack cocaine abuse.   -will likely need outpatient psych follow up -determined to have capacity by psych but lacking organization skills to follow diabetic regimen.  She did agree to SNF if it is at [facility]. Psych does not determine competence, that is done by a court.   Tracheostomy dependent-chronic respiratory failure  She is now s/p removal of trach all weekend, claims she  was cleaning it this weekend and when she went to put back in her site was closed.  Currently talking and eating well, no complaints of dysphagia.  Per PCP, when she last spoke to Dr. 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   -continue to monitor  -discussed with Dr. Pollyann Kennedy on the telephone yesterday--  Will monitor closely for now, maybe she can get by without it, however, if respiratory status changes, stridor present, will need to contact him and take back to OR   Hx of hypothyroidism: currently not on any medications.  TSH 10/24/12: 5.939 and prior TSH on 05/20/12 was 2.106 with free T4 of 0.71 on 11/14/11.    -follow up T3/T4 level as outpatient with PCP recommended and consider treatment as needed  F/E/N  -Mild Hyponatremia, 134 on admission-asymptomatic. Will monitor BMET  -tolerating carb modified diet without complaint--will change to carb mod low per nutrition -cont ensure and iron supplementation  -nutrition consult--calorie count initiated, d/c ensure, start glucerna shake.  Unintended weight loss  ~14%  DVT px  -lovenox   Dispo: d/c to SNF--requesting [facility]    The patient does have a current PCP Nicole Media, MD), therefore will be requiring [facility] follow-up after discharge.  The patient does have transportation limitations that hinder transportation to clinic appointments.  Services Needed at time of discharge: Y = Yes, Blank = No PT:   OT:   RN:   Equipment:   Other:     LOS: 2 days   [name] 10/29/2012, 7:52 AM

## 2012-10-29 NOTE — Clinical Social Work Psych Note (Signed)
Clinical Social Work Progress Note PSYCHIATRY SERVICE LINE 10/29/2012  Patient:  [name]  Account:  [id number]  Admit Date:  10/27/2012  Clinical Social Worker:  Read Drivers  Date/Time:  10/29/2012 02:07 PM  Review of Patient  Overall Medical Condition:   stable   Participation Level:  Active  Participation Quality  Appropriate   Other Participation Quality:   nothing to report     Reaction to Medications/Concerns:   none   Modes of Intervention  Support   Summary of Progress/Plan at Discharge   Psych CSW met with Psychiatrist who stated that she had seen the pt and pt had capacity to make her own medical decisions.  Psychiatrist suggested SNF for pt due to her inability to care for herself.  Psychiatrist informed CSW no other psych needs available.  Psych CSW noticed unit CSW already following.  Psych CSW signing off.       Vickii Penna, LCSWA [phone]  Clinical Social Work

## 2012-10-29 NOTE — Clinical Social Work Psychosocial (Addendum)
    Clinical Social Work Department BRIEF PSYCHOSOCIAL ASSESSMENT 10/29/2012  Patient:  [name], [name]     Account Number:  [id number]     Admit date:  10/27/2012  Clinical Social Worker:  Hulan Fray  Date/Time:  10/29/2012 11:35 AM  Referred by:  Physician  Date Referred:  10/29/2012 Referred for  SNF Placement   Other Referral:   Interview type:  Patient Other interview type:   Physician    PSYCHOSOCIAL DATA Living Status:  OTHER Admitted from facility:   Level of care:   Primary support name:  [name] Primary support relationship to patient:  PARTNER Degree of support available:   adequate    CURRENT CONCERNS Current Concerns  Post-Acute Placement   Other Concerns:    SOCIAL WORK ASSESSMENT / PLAN Clinical Social Worker received referral from MD requesting SNF placement for patient. CSW reviewed chart. Per chart review patient [facility]. CSW called MD to clarify need for SNF placement and per MD, the SNF request is for medication management.    CSW spoke with patient and patient confirmed interest in [facility]. CSW asked patient if there was a backup facility to search as well and patient did not have any other facilities for CSW to initiate a search to. Per patient she has been to the facility previously.   Assessment/plan status:  Psychosocial Support/Ongoing Assessment of Needs Other assessment/ plan:   Clinical Social Worker received response from [facility] and they do not have availability for patient. CSW spoke with patient and her significant other, [name] at bedside to inform of the response from facility. Both patient and [name] reported to return home with home health services. [name] stated that patient has a lot of support around her because she lives with him, her mother, niece and nephew. [name] informed CSW that other SNF searches will be "too far away" and prefer to take patient home and wait until availability opens up with [facility]. CSW  encouraged [name] to stay in contact with facility. CSW will inform MD. CSW informed CM regarding request for home health services with [facility]. CSW will sign off, as social work intervention is no longer needed. Information/referral to community resources:   CSW will provide SNF packet to patient    PATIENT'S/FAMILY'S RESPONSE TO PLAN OF CARE: Patient reported that she is agreeable for [facility] SNF if there is availability. Patient was appreciative of CSW"s visit.

## 2012-10-29 NOTE — Progress Notes (Signed)
Met with patient and friend/husband [name] and discussed the following: how to use an insulin pen, appropriate and fast treatments for low blood sugars to keep on her at all times,  carb consistent diet with ~ 45 grams (3 servings) of food that raise blood sugar at each meal 3 times daily, staying in at night with [name] once discharged to home and only taking and keeping insulin administered/given to her. [name] and [name] verbalized understanding and demonstrated proper technique with insulin pens. Discussed carb consistent meal plan with  Blaine's nurse, [name] and inpatient dietitian. Swayzie requests to switch to using the Lantus pen when she goes home. [name] suggests we provide her with one sample pen at a time. A sample of pen needles was provided with instructions on how to use the pens today.

## 2012-10-29 NOTE — Telephone Encounter (Signed)
Received two messages on voicemail from last night from patient verbalizing confusion over why she is getting rapid acting insulin.

## 2012-10-30 LAB — GLUCOSE, CAPILLARY
Glucose-Capillary: 286 mg/dL — ABNORMAL HIGH (ref 70–99)
Glucose-Capillary: 195 mg/dL — ABNORMAL HIGH (ref 70–99)
Glucose-Capillary: 505 mg/dL — ABNORMAL HIGH (ref 70–99)
Glucose-Capillary: 304 mg/dL — ABNORMAL HIGH (ref 70–99)

## 2012-10-30 MED ORDER — INSULIN ASPART 100 UNIT/ML ~~LOC~~ SOLN
5.0000 [IU] | Freq: Once | SUBCUTANEOUS | Status: AC
  Administered 2012-10-30: 5 [IU] via SUBCUTANEOUS

## 2012-10-30 NOTE — Progress Notes (Signed)
Pt showing in work list as having a #4.0 cuffless trach.  Went to assess pt and found pt without trach and stoma closed.  Could not locate note as to when trach had been removed.  Updated LDA to reflect no current airway in place.

## 2012-10-30 NOTE — Progress Notes (Signed)
Utilization review completed. Khandi Kernes RN CCM Case Mgmt phone [phone] 

## 2012-10-30 NOTE — Progress Notes (Signed)
Pt. CBG was 505 at lunch.  Dr. Virgina Organ paged and return call received.  Orders to re-check in 15 minutes before she eats lunch and call her back with results.  Will continue to monitor and carry out orders.  Forbes Cellar, RN

## 2012-10-30 NOTE — Progress Notes (Signed)
Re-check CBG was 518.  Dr. Virgina Organ paged and return called.  To place new orders.  When continue to monitor and carry out orders.  Forbes Cellar, RN

## 2012-10-30 NOTE — Clinical Social Work Note (Signed)
Clinical Social Worker reviewed chart and extended the SNF search within [facility]. At this time, patient does not have any bed offers.   Rozetta Nunnery MSW, [employer] [phone]

## 2012-10-30 NOTE — Progress Notes (Signed)
Calorie Count Note  24 hour calorie count ordered- now finished.   Diet: Carb Mod Low, 2gm Na Supplements: Glucerna PRN  Breakfast: 624 kcal, 26 gm protein, and 73 gm carbohydrates (this was prior to changing diet to carbohydrate modified low that provides 45-65 grams of carbohydrates per meal) Lunch: 565 kcal, 17 gm protein, and 50 gm carbohydrates Dinner: 328 kcal, 12 gm protein, and 40 gm carbpohydrates Supplements: none  Total intake: 1517 kcal (116% of minimum estimated needs)  55 gm protein (100% of minimum estimated needs) 163 gm carbohydrates ( recommend 45 grams per meal)  Nutrition Dx: unintentional weight loss related to uncontrolled DM as evidenced by 14% weight loss.   Goal: Oral intake to meet >/=90% estimated nutrition needs. -Met  Intervention: Pt has been working with [employer] with carbohydrate counting for focus on 45 grams of carbohydrate per meal. Continue on current diet.   Clarene Duke RD, LDN Pager [phone] After Hours pager [phone]

## 2012-10-30 NOTE — Progress Notes (Signed)
Patient states that her blood sugar was too high and she needed insulin. RN told patient that she would have to check her blood sugar first. Patient refused at first but then allowed CNA to check blood sugar. Blood sugar was 286, patient states that she want insulin immediately. RN tried to explain that she was not able to administer insulin because according to orders, insulin was only supposed to be given if blood sugar was over 300, patient began to pretend that she was asleep and made a snoring noise. 15 minutes later, patient again demanded insulin. RN tried to explain that provider didn't want insulin administered until blood sugar was over 300. She then asked RN to page MD from her room so that she could see RN have a conversation with the provider concerning her insulin. Provider on call was notified and provider reinforced what RN had been explaining to patient. Patient then slammed her head into the pillow and asked for RN to leave the room. Will continue to monitor.   Marcelyn Bruins RN BSN

## 2012-10-30 NOTE — Progress Notes (Signed)
10/30/2012 1630 Pt has services with [facility] and will need resumption of care orders at dc. SNF placement and CSW sent bed offers. Isidoro Donning RN CCM Case Mgmt phone [phone]

## 2012-10-30 NOTE — Progress Notes (Signed)
Inpatient Diabetes Program Recommendations  AACE/ADA: New Consensus Statement on Inpatient Glycemic Control (2013)  Target Ranges:  Prepandial:   less than 140 mg/dL      Peak postprandial:   less than 180 mg/dL (1-2 hours)      Critically ill patients:  140 - 180 mg/dL   Reason for Visit: Results for SOLAE, NORLING (MRN [id number]) as of 10/30/2012 13:19  Ref. Range 10/30/2012 07:52 10/30/2012 12:00 10/30/2012 12:30  Glucose-Capillary Latest Range: 70-99 mg/dL 409 (H) 811 (H) 914 (H)   Note correction Novolog not being given unless greater than 300 mg/dL.  CBG increased substantially atfer meals.   May consider ordering Novolog 2 units tid (Hold if patient eats less than 50%).

## 2012-10-30 NOTE — Progress Notes (Signed)
Subjective: Ms. Warsame was seen and examined this morning.  She was seen by social work yesterday who notified her that [facility] did not have any bed available at this time.  Initially, she did not wish to go to any other SNF but after discussion with resident yesterday evening and this morning with myself, she is agreeable to another SNF as long as it is close to her home.  Overnight, she insisted on receiving insulin per RN when her CBG was less than 300.  Insulin was not given overnight and she subsequently went to bed.  No hypoglycemic episodes yesterday.  Her airway remains patent at this time without complaints with a closed trach site.  She denies any dysphagia, fever, chills, N/V/D, abdominal pain, chest pain, or shortness of breath at this time.    Objective: Vital signs in last 24 hours: Filed Vitals:   10/29/12 0529 10/29/12 1445 10/29/12 2117 10/30/12 0500  BP: 137/80 120/76 118/89 106/72  Pulse: 94 99 94 85  Temp: 97.9 F (36.6 C) 98.4 F (36.9 C) 98.2 F (36.8 C) 98 F (36.7 C)  TempSrc: Oral Oral  Oral  Resp: 16 18 18 20   Height:      Weight:      SpO2: 100% 100% 99% 100%   Vitals reviewed. General: resting in bed, NAD, raspy voice HEENT: PERRL, EOMI, trach site appears closed, no blood or mucus around site  Cardiovascular: Regular rate and rhythm, no murmurs, rubs or gallops  Respiratory: b/l rhonchi Abdomen: Soft, nondistended, nontender, bowel sounds present  Extremities: Warm and well-perfused, no edema. Moving all 4 extremities.  Skin: Warm, dry, no rashes  Psych : childlike affect and thanks God often.  Neuro: Alert and oriented, responsive, follows commands  Lab Results: Basic Metabolic Panel:  Recent Labs Lab 10/24/12 0256 10/27/12 1158 10/28/12 0538  NA  --  134* 134*  K  --  4.4 4.3  CL  --  100 98  CO2  --  25 28  GLUCOSE  --  251* 117*  BUN  --  17 17  CREATININE  --  0.54 0.60  CALCIUM  --  9.0 9.4  MG 2.1  --   --   PHOS 3.3  --   --     Liver Function Tests:  Recent Labs Lab 10/24/12 0256 10/27/12 1158  AST 20 18  ALT 9 8  ALKPHOS 56 50  BILITOT 0.3 0.2*  PROT 8.1 7.8  ALBUMIN 3.5 3.2*    Recent Labs Lab 10/27/12 1158  LIPASE 12   CBC:  Recent Labs Lab 10/23/12 1857 10/24/12 0222 10/27/12 1158  WBC 5.3 7.1 11.1*  NEUTROABS 3.1  --  8.8*  HGB 12.3 11.2* 11.8*  HCT 37.5 33.9* 35.4*  MCV 61.9* 61.9* 61.9*  PLT 211 210 187   CBG:  Recent Labs Lab 10/28/12 2200 10/29/12 0746 10/29/12 1216 10/29/12 1704 10/29/12 2115 10/30/12 0300  GLUCAP 100* 121* 373* 269* 324* 286*   Thyroid Function Tests:  Recent Labs Lab 10/24/12 0256  TSH 5.939*   Urine Drug Screen: Drugs of Abuse     Component Value Date/Time   LABOPIA NONE DETECTED 10/27/2012 1156   COCAINSCRNUR NONE DETECTED 10/27/2012 1156   LABBENZ NONE DETECTED 10/27/2012 1156   AMPHETMU NONE DETECTED 10/27/2012 1156   THCU NONE DETECTED 10/27/2012 1156   LABBARB NONE DETECTED 10/27/2012 1156    Urinalysis:  Recent Labs Lab 10/27/12 1156  COLORURINE YELLOW  LABSPEC 1.015  PHURINE  8.5*  GLUCOSEU NEGATIVE  HGBUR NEGATIVE  BILIRUBINUR NEGATIVE  KETONESUR 15*  PROTEINUR NEGATIVE  UROBILINOGEN 0.2  NITRITE NEGATIVE  LEUKOCYTESUR NEGATIVE   Medications: I have reviewed the patient's current medications. Scheduled Meds: . divalproex  125 mg Oral Daily  . enoxaparin (LOVENOX) injection  40 mg Subcutaneous Q24H  . ferrous sulfate  325 mg Oral Q breakfast  . insulin aspart  0-4 Units Subcutaneous TID WC  . insulin glargine  5 Units Subcutaneous QHS  . latanoprost  1 drop Both Eyes QHS  . multivitamin with minerals  1 tablet Oral Daily   Continuous Infusions:  PRN Meds:.acetaminophen, acetaminophen, albuterol, dextrose, feeding supplement  Assessment/Plan: 47 y.o female with poorly controlled type 1 DM who presents to the ED with hypoglycemia with history of multiple similar episodes leading to admission.   Hypoglycemia in  setting of insulin use/misuse--history of multiple episodes of hypoglycemia with ED visits and hospitalizations. Most likely secondary to exogenous and improper insulin use. Other causes of hypoglycemia were also considered including poor PO intake, insulinoma (less likely due to low blood insulin level). Insulin autoimmune hypoglycemia may a possible etiology as well.  She was recently discharged from the hospital on 4/11 with 5u lantus QAM without a sliding scale. Her last C-peptide level was less than 0.10 on 4/10 and her random blood insulin level was 3 during the last admission.  -continue lantus 5u daily -CBGs monitoring -hypoglycemic protocol  -consult social work for SNF placement--no beds available at [facility] at this time but willing to go another SNF close to her home  Diabetes mellitus type 1, uncontrolled, insulin dependent--no hypoglyemic episodes overnight.  Last Hgb A1C was 10.2 in February 2014. Her most recent office visit shows that her mealtime coverage was discontinued in setting of hypoglycemic events.  -cont 5u lantus daily as above  -customized meal time coverage to start if CBGs >300 -monitor closely for exogenous administration--insulin vial removed by RN yesterday from personal belongings  Mental Disorder--unidentified.  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  Used to follow at mental health but does not anymore.  Has been on Risperdal in the past per pcp.  Hx of crack cocaine abuse.   -will likely need outpatient psych follow up, used to follow at [facility], consider continuing at d/c -determined to have capacity by psych but lacking organization skills to follow diabetic regimen.  She did agree to SNF if it is at [facility]. Psych does not determine competence, that is done by a court.   Tracheostomy dependent-chronic respiratory failure  She is now s/p removal of trach all  weekend, claims she was cleaning it this weekend and when she went to put back in her site was closed.  Currently talking and eating well, no complaints of dysphagia.  Per PCP, when she last spoke to Dr. 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   -continue to monitor  -discussed with Dr. Pollyann Kennedy on the telephone during this admission--  Will monitor closely for now, maybe she can get by without it, however, if respiratory status changes, stridor present, will need to contact him and take back to OR   Hx of hypothyroidism: currently not on any medications.  TSH 10/24/12: 5.939 and prior TSH on 05/20/12 was 2.106 with free T4 of 0.71 on 11/14/11.    -follow up T3/T4 level as outpatient with PCP recommended and consider treatment as needed  F/E/N  -Mild Hyponatremia, 134 on admission-asymptomatic. Will monitor BMET  -tolerating carb modified diet without complaint--will change to carb mod low per nutrition -cont ensure and iron supplementation  -nutrition consult--calorie count initiated, d/c ensure, start glucerna shake.  Unintended weight loss ~14%  DVT px  -lovenox   Dispo: d/c to SNF--[facility] does not have beds available at this time but she is agreeable to SNF close to her home.   The patient does have a current PCP Blanch Media, MD), therefore will be requiring OPC follow-up after discharge.  The patient does have transportation limitations that hinder transportation to clinic appointments.  Services Needed at time of discharge: Y = Yes, Blank = No PT:   OT:   RN:   Equipment:   Other:     LOS: 3 days   [name] 10/30/2012, 7:42 AM

## 2012-10-31 ENCOUNTER — Telehealth: Payer: Self-pay | Admitting: Dietician

## 2012-10-31 LAB — GLUCOSE, CAPILLARY
Glucose-Capillary: 295 mg/dL — ABNORMAL HIGH (ref 70–99)
Glucose-Capillary: 478 mg/dL — ABNORMAL HIGH (ref 70–99)

## 2012-10-31 MED ORDER — INSULIN ASPART 100 UNIT/ML ~~LOC~~ SOLN
0.0000 [IU] | Freq: Three times a day (TID) | SUBCUTANEOUS | Status: DC
  Administered 2012-10-31 – 2012-11-01 (×2): 4 [IU] via SUBCUTANEOUS

## 2012-10-31 MED ORDER — INSULIN GLARGINE 100 UNIT/ML ~~LOC~~ SOLN
3.0000 [IU] | Freq: Every day | SUBCUTANEOUS | Status: DC
  Administered 2012-11-01: 3 [IU] via SUBCUTANEOUS
  Filled 2012-10-31: qty 0.03

## 2012-10-31 MED ORDER — INSULIN ASPART 100 UNIT/ML ~~LOC~~ SOLN
5.0000 [IU] | Freq: Once | SUBCUTANEOUS | Status: AC
  Administered 2012-10-31: 5 [IU] via SUBCUTANEOUS

## 2012-10-31 NOTE — Clinical Social Work Note (Signed)
Clinical Social Worker followed up with patient and her significant other, [name] regarding no offers from [employer]. Patient and [name] informed CSW that patient is returning home with home health services, from [facility] and they will be do more frequent visits to patient. CSW will sign off, as social work intervention is no longer needed.   Rozetta Nunnery MSW, [employer] [phone]

## 2012-10-31 NOTE — Telephone Encounter (Signed)
My plan is to NOT to D/C [name] unless it is to a SNF as I fear for her well being if she were to be D/C'd home. [name] is still willing to go to SNF. How is [facility] able to take her on as a client as she no longer has the trach and is independent in her ADL's?

## 2012-10-31 NOTE — Telephone Encounter (Signed)
[name] ( not from [facility]) is trying to arrange for personal care services to see [name] 6-7 days a week for ~ 2 hours a day to assist with insulin, eating and self monitoring to help stabilize blood sugars immediately after discharge. Per [name],  if patient is discharged tomorrow,  Monday or Tuesday when form can be expedited, they can see her immediately. If discharged over weekend to home and form not able to be expedited, may take several weeks for them to start seeing her.  PCS form received and given to Dr. Dorise Hiss. [name] asked that social work or CDE follow up with her Monday.

## 2012-10-31 NOTE — Telephone Encounter (Signed)
Patient calling from room in hospital saying she does not feel good due to high blood sugar, cannot sleep, drinking water, urinating. Encouraged patient to discuss with nurse and doctor. Visit to patient yesterday afternoon, she had not eaten her lunch because of high blood sugars. Encouraged nurse to order glucerna shake to floor and patient to eat the low carb choices on her trays until her blood sugar comes down. Suggest extra calories from lower carb foods on trays along with glucerna

## 2012-10-31 NOTE — Progress Notes (Signed)
Subjective: [name] was seen and examined this morning.  No hypoglycemic episodes yesterday but she is complaining that her sugars are too high and she needs insulin.  Her airway remains patent at this time without complaints with a closed trach site.  She denies any dysphagia, fever, chills, N/V/D, abdominal pain, chest pain, or shortness of breath at this time.    Objective: Vital signs in last 24 hours: Filed Vitals:   10/30/12 0500 10/30/12 1300 10/30/12 2158 10/31/12 0632  BP: 106/72 133/82 122/76 130/82  Pulse: 85 93 93 89  Temp: 98 F (36.7 C) 97.6 F (36.4 C) 98.3 F (36.8 C) 97.4 F (36.3 C)  TempSrc: Oral Oral Oral Oral  Resp: 20 20 20 18   Height:      Weight:      SpO2: 100% 100% 100% 100%   Vitals reviewed. General: resting in bed, NAD, raspy voice HEENT: PERRL, EOMI, trach site appears closed, no blood or mucus around site  Cardiovascular: Regular rate and rhythm, no murmurs, rubs or gallops  Respiratory: b/l rhonchi Abdomen: Soft, nondistended, nontender, bowel sounds present  Extremities: Warm and well-perfused, no edema. Moving all 4 extremities.  Skin: Warm, dry, no rashes  Psych : childlike affect and thanks God often.  Neuro: Alert and oriented, responsive, follows commands  Lab Results: Basic Metabolic Panel:  Recent Labs Lab 10/27/12 1158 10/28/12 0538  NA 134* 134*  K 4.4 4.3  CL 100 98  CO2 25 28  GLUCOSE 251* 117*  BUN 17 17  CREATININE 0.54 0.60  CALCIUM 9.0 9.4   Liver Function Tests:  Recent Labs Lab 10/27/12 1158  AST 18  ALT 8  ALKPHOS 50  BILITOT 0.2*  PROT 7.8  ALBUMIN 3.2*    Recent Labs Lab 10/27/12 1158  LIPASE 12   CBC:  Recent Labs Lab 10/27/12 1158  WBC 11.1*  NEUTROABS 8.8*  HGB 11.8*  HCT 35.4*  MCV 61.9*  PLT 187   CBG:  Recent Labs Lab 10/30/12 1230 10/30/12 1750 10/30/12 2159 10/31/12 0730 10/31/12 0847 10/31/12 1128  GLUCAP 518* 335* 304* 272* 295* 478*   Urine Drug Screen: Drugs of  Abuse     Component Value Date/Time   LABOPIA NONE DETECTED 10/27/2012 1156   COCAINSCRNUR NONE DETECTED 10/27/2012 1156   LABBENZ NONE DETECTED 10/27/2012 1156   AMPHETMU NONE DETECTED 10/27/2012 1156   THCU NONE DETECTED 10/27/2012 1156   LABBARB NONE DETECTED 10/27/2012 1156    Urinalysis:  Recent Labs Lab 10/27/12 1156  COLORURINE YELLOW  LABSPEC 1.015  PHURINE 8.5*  GLUCOSEU NEGATIVE  HGBUR NEGATIVE  BILIRUBINUR NEGATIVE  KETONESUR 15*  PROTEINUR NEGATIVE  UROBILINOGEN 0.2  NITRITE NEGATIVE  LEUKOCYTESUR NEGATIVE   Medications: I have reviewed the patient's current medications. Scheduled Meds: . divalproex  125 mg Oral Daily  . enoxaparin (LOVENOX) injection  40 mg Subcutaneous Q24H  . ferrous sulfate  325 mg Oral Q breakfast  . insulin aspart  0-4 Units Subcutaneous TID WC  . insulin glargine  5 Units Subcutaneous QHS  . latanoprost  1 drop Both Eyes QHS  . multivitamin with minerals  1 tablet Oral Daily   Continuous Infusions:  PRN Meds:.acetaminophen, acetaminophen, albuterol, dextrose, feeding supplement  Assessment/Plan: 47 y.o female with poorly controlled type 1 DM who presents to the ED with hypoglycemia with history of multiple similar episodes leading to admission.   Hypoglycemia in setting of insulin use/misuse--history of multiple episodes of hypoglycemia with ED visits and hospitalizations. Most  likely secondary to exogenous and improper insulin use. Other causes of hypoglycemia were also considered including poor PO intake, insulinoma (less likely due to low blood insulin level). Insulin autoimmune hypoglycemia may a possible etiology as well.  She was recently discharged from the hospital on 4/11 with 5u lantus QAM without a sliding scale. Her last C-peptide level was less than 0.10 on 4/10 and her random blood insulin level was 3 during the last admission.  -continue lantus 5u daily, consider increasing given hyperglycemia; calculated basal insulin per  diabetes educator [name] Plyler ~9 units /day -CBGs monitoring -hypoglycemic protocol  -consult social work for SNF placement--no beds available at [facility] at this time but willing to go another SNF close to her home; no bed requests yet  Diabetes mellitus type 1, uncontrolled, insulin dependent--no hypoglyemic episodes overnight.  Last Hgb A1C was 10.2 in February 2014. Her most recent office visit shows that her mealtime coverage was discontinued in setting of hypoglycemic events.  -cont 5u lantus daily as above but may consider increasing -customized meal time coverage to start if CBGs >300 -monitor closely for exogenous administration--insulin vial removed by RN yesterday from personal belongings  Mental Disorder--unidentified.  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  Used to follow at mental health but does not anymore.  Has been on Risperdal in the past per pcp.  Hx of crack cocaine abuse.   -will likely need outpatient psych follow up, used to follow at [facility], consider continuing at d/c -determined to have capacity by psych but lacking organization skills to follow diabetic regimen.  She did agree to SNF if it is at [facility]. Psych does not determine competence, that is done by a court.   Tracheostomy dependent-chronic respiratory failure  She is now s/p removal of trach all weekend, claims she was cleaning it this weekend and when she went to put back in her site was closed.  Currently talking and eating well, no complaints of dysphagia.  Per PCP, when she last spoke to Dr. 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   -continue to monitor  -discussed with Dr. Pollyann Kennedy on the telephone during this admission--  Will monitor closely for now, maybe she can get by without it, however, if respiratory status changes, stridor present, will need to contact him and take back to OR   Hx  of hypothyroidism: currently not on any medications.  TSH 10/24/12: 5.939 and prior TSH on 05/20/12 was 2.106 with free T4 of 0.71 on 11/14/11.    -follow up T3/T4 level as outpatient with PCP recommended and consider treatment as needed  F/E/N  -Mild Hyponatremia, 134 on admission-asymptomatic. Will monitor BMET  -tolerating carb modified diet without complaint--will change to carb mod low per nutrition -cont ensure and iron supplementation  -nutrition consult--calorie count initiated, d/c ensure, start glucerna shake.  Unintended weight loss ~14%  DVT px  -lovenox   Dispo: d/c to SNF--[facility] not have beds available at this time but she is agreeable to SNF close to her home.   The patient does have a current PCP Blanch Media, MD), therefore will be requiring OPC follow-up after discharge.  The patient does have transportation limitations that hinder transportation to clinic appointments.  Services Needed at time of discharge: Y = Yes, Blank = No PT:   OT:   RN:   Equipment:   Other:     LOS:  4 days   [name] 10/31/2012, 11:36 AM

## 2012-10-31 NOTE — Progress Notes (Addendum)
Inpatient Diabetes Program Recommendations  AACE/ADA: New Consensus Statement on Inpatient Glycemic Control (2013)  Target Ranges:  Prepandial:   less than 140 mg/dL      Peak postprandial:   less than 180 mg/dL (1-2 hours)      Critically ill patients:  140 - 180 mg/dL   Discharge insulin orders of concern for potential for hperglycemia Concern that with no insulin to be given until glucose reaches 301 mg/dL.  At this point, pt feels she needs insulin and imay be likely to give too much insulin in order to somewhat normalize. Would agree that if pt eats at least 50% of meal, she be given 2 units novolog in addtion to correction Using 301 as baseline before correction begins potentiates extreme hyperglycemia as seen here using this scale. Would recommend starting scale at at least 201 mg/dL with 2 units tidwc. Thank you, Lenor Coffin, RN, CNS, Diabetes Coordinator [phone])

## 2012-10-31 NOTE — Progress Notes (Signed)
Lunch time CBG was 478.  Dr. Virgina Organ paged and return called received.  Informed of 478 CBG to place order for some insulin.  Will continue to monitor.  Arval Brandstetter Laural Benes, Charity fundraiser.

## 2012-10-31 NOTE — Telephone Encounter (Signed)
Returned call from Teodora Medici with [facility]. Gave her CDE phone number and assisted with coordinating patient's transition of care from in the hospital.  She said she was trying to reach our Child psychotherapist.  [name] explained that she s trying to obtain permission to be able to see Keyarra on day of discharge or day after. Offered to relay a message:

## 2012-10-31 NOTE — Progress Notes (Signed)
Internal Medicine Teaching Service Attending Note Date: 10/31/2012  Patient name: [name]  Medical record number: [id number]  Date of birth: 07/04/1966    This patient has been seen and discussed with the house staff. Please see their note for complete details. I concur with their findings with the following additions/corrections: [name] is competent to make her own healthcare decisions. However, she makes rather poor decisions. Her ability to control her complex medical issues is hampered by her undx and untx mental health issues (working with [facility] and [place]) and a home environment that is not up to the level that she needs help and support. Since Jan 2014, she has had three admissions (2 for hypoglycemia) and 10 (I think) ED visits all for hypoglycemia - down to CBG's in teens. She is a threat to herself unintentionally. We have managed to prevent the hypoglycemic episodes as an inpt but she is now quite hyperglycemic and is symptomatic. She will now need up titration of her insulin with monitoring as an inpatient as this is the ONLY safe way to do this. She will then need D/C to SNF as her home environment is not suitable. She is agreeable to SNF. I discussed briefly with Edson Snowball and sometimes [facility] will accept Medicaid pts so if we cannot find any other bed, we can discuss with Reyne Dumas if Sarabella can go there. [name] will need to remain as an inpt until she can be D/C'd to SNF as there are no other safe arrangements for her dispo.  BUTCHER,ELIZABETH 10/31/2012, 2:28 PM

## 2012-11-01 LAB — GLUCOSE, CAPILLARY
Glucose-Capillary: 190 mg/dL — ABNORMAL HIGH (ref 70–99)
Glucose-Capillary: 240 mg/dL — ABNORMAL HIGH (ref 70–99)
Glucose-Capillary: 479 mg/dL — ABNORMAL HIGH (ref 70–99)
Glucose-Capillary: 168 mg/dL — ABNORMAL HIGH (ref 70–99)
Glucose-Capillary: 348 mg/dL — ABNORMAL HIGH (ref 70–99)

## 2012-11-01 MED ORDER — INSULIN GLARGINE 100 UNIT/ML ~~LOC~~ SOLN: 4.0000 [IU] | Freq: Every day | SUBCUTANEOUS | Status: DC

## 2012-11-01 MED ORDER — GLUCERNA SHAKE PO LIQD: 237.0000 mL | Freq: Every day | ORAL | Status: DC | PRN

## 2012-11-01 MED ORDER — INSULIN ASPART 100 UNIT/ML FLEXPEN: 2.0000 [IU] | Freq: Three times a day (TID) | SUBCUTANEOUS | Status: DC

## 2012-11-01 MED ORDER — INSULIN GLARGINE 100 UNIT/ML ~~LOC~~ SOLN
5.0000 [IU] | SUBCUTANEOUS | Status: DC
  Filled 2012-11-01: qty 0.05

## 2012-11-01 MED ORDER — INSULIN GLARGINE 100 UNIT/ML ~~LOC~~ SOLN: 5.0000 [IU] | Freq: Every morning | SUBCUTANEOUS | Status: DC

## 2012-11-01 MED ORDER — INSULIN ASPART 100 UNIT/ML ~~LOC~~ SOLN
5.0000 [IU] | Freq: Once | SUBCUTANEOUS | Status: AC
  Administered 2012-11-01: 5 [IU] via SUBCUTANEOUS

## 2012-11-01 NOTE — Clinical Social Work Note (Addendum)
Clinical Social Worker signed back on after discussion with Dr. Virgina Organ this morning. CSW staffed case with supervisor and Wellsite geologist. CSW reached out to [facility], and will follow up with CSW.  CSW reached out to [facility] and they responded with a "no." CSW spoke with [facility], [place] and she reported patient will need to stay 30 days due to her insurance and patient previously left before her 30 days were up. CSW reached out to [facility] and they are willing to speak with patient regarding their terms and hopeful for admission. CSW sent referrals to [facility] and [facility] SNFs as well. CSW will continue to follow. CSW will update patient.   14:49pm CSW received call from [facility] and they are able to accept patient today. CSW notified MD of bed offer. CSW spoke with patient and she is agreeable to be transported via ambulance. CSW will complete discharge packet.   Rozetta Nunnery MSW, [employer] [phone]

## 2012-11-01 NOTE — Progress Notes (Signed)
Patient at shift change was set to be discharged to [facility].  Patient refused to facility and wants to go home. Provider on call was contacted about patients refusal to go to a facility. MD Elnoria Howard and a resident came to speak with patient and patient wanted to leave against medical advice. RN tried to get patient to sign AMA paper but patient was refusing. House Coverage called to speak with patient to see if she could advise her to spend the night. Patient is willing to do this. Will continue to monitor.   Marcelyn Bruins RN BSN

## 2012-11-01 NOTE — Discharge Summary (Signed)
See Dr. Waynard Reeds Discharge Summary also from 11/01/12  Primary team prepared for patient to go to [facility] but she refused after being explained the risks and benefits of not going to a SNF.  She is unable to care for herself at home and has multiple admissions for hypoglycemia.  She needs 24/7 care.  She was able to explain to use why we wanted her to go to a SNF but when asked about the risks of not going she stated the only God knows her future.  She chooses to go home.  If that is her decision we will have her sign an AMA form but she refuses to sign that form as well.  The patient and companion left for home against medical recommendation.   Shirlee Latch MD [phone]

## 2012-11-01 NOTE — Progress Notes (Signed)
Patient called a female companion to come up to the floor to get her stuff. States she does not want to be treated by her doctor any more and wanted to go home. Patient refused to sign the AMA form but states that she is leaving. Two RN's witness this and AMA form was filled out. Patient walked out with female companion. Will continue to monitor.  Marcelyn Bruins RN BSN

## 2012-11-01 NOTE — Progress Notes (Addendum)
Subjective: Ms. Burlison was seen and examined this morning.  No hypoglycemic episodes overnight. She denies any dysphagia, fever, chills, N/V/D, abdominal pain, chest pain, or shortness of breath at this time.    Objective: Vital signs in last 24 hours: Filed Vitals:   10/31/12 0632 10/31/12 1414 10/31/12 2141 11/01/12 0534  BP: 130/82 150/110 142/81 118/77  Pulse: 89 97 87 87  Temp: 97.4 F (36.3 C) 98.1 F (36.7 C) 98.3 F (36.8 C) 98.2 F (36.8 C)  TempSrc: Oral Oral Oral Oral  Resp: 18 18 20 16   Height:      Weight:      SpO2: 100% 99% 100% 99%   Vitals reviewed. General: resting in bed, NAD, raspy voice HEENT: PERRL, EOMI, trach site appears closed, no blood or mucus around site  Cardiovascular: Regular rate and rhythm, no murmurs, rubs or gallops  Respiratory: b/l rhonchi Abdomen: Soft, nondistended, nontender, bowel sounds present  Extremities: Warm and well-perfused, no edema. Moving all 4 extremities.  Skin: Warm, dry, no rashes  Psych : childlike affect and thanks God often.  Neuro: Alert and oriented, responsive, follows commands  Lab Results: Basic Metabolic Panel:  Recent Labs Lab 10/27/12 1158 10/28/12 0538  NA 134* 134*  K 4.4 4.3  CL 100 98  CO2 25 28  GLUCOSE 251* 117*  BUN 17 17  CREATININE 0.54 0.60  CALCIUM 9.0 9.4   Liver Function Tests:  Recent Labs Lab 10/27/12 1158  AST 18  ALT 8  ALKPHOS 50  BILITOT 0.2*  PROT 7.8  ALBUMIN 3.2*    Recent Labs Lab 10/27/12 1158  LIPASE 12   CBC:  Recent Labs Lab 10/27/12 1158  WBC 11.1*  NEUTROABS 8.8*  HGB 11.8*  HCT 35.4*  MCV 61.9*  PLT 187   CBG:  Recent Labs Lab 10/31/12 0847 10/31/12 1128 10/31/12 1712 10/31/12 2142 11/01/12 0152 11/01/12 0643  GLUCAP 295* 478* 359* 173* 242* 190*   Urine Drug Screen: Drugs of Abuse     Component Value Date/Time   LABOPIA NONE DETECTED 10/27/2012 1156   COCAINSCRNUR NONE DETECTED 10/27/2012 1156   LABBENZ NONE DETECTED 10/27/2012  1156   AMPHETMU NONE DETECTED 10/27/2012 1156   THCU NONE DETECTED 10/27/2012 1156   LABBARB NONE DETECTED 10/27/2012 1156    Urinalysis:  Recent Labs Lab 10/27/12 1156  COLORURINE YELLOW  LABSPEC 1.015  PHURINE 8.5*  GLUCOSEU NEGATIVE  HGBUR NEGATIVE  BILIRUBINUR NEGATIVE  KETONESUR 15*  PROTEINUR NEGATIVE  UROBILINOGEN 0.2  NITRITE NEGATIVE  LEUKOCYTESUR NEGATIVE   Medications: I have reviewed the patient's current medications. Scheduled Meds: . divalproex  125 mg Oral Daily  . enoxaparin (LOVENOX) injection  40 mg Subcutaneous Q24H  . ferrous sulfate  325 mg Oral Q breakfast  . insulin aspart  0-4 Units Subcutaneous TID WC  . insulin glargine  3 Units Subcutaneous Daily  . insulin glargine  5 Units Subcutaneous Q24H  . latanoprost  1 drop Both Eyes QHS  . multivitamin with minerals  1 tablet Oral Daily   Continuous Infusions:  PRN Meds:.acetaminophen, acetaminophen, albuterol, dextrose, feeding supplement  Assessment/Plan: 47 y.o female with poorly controlled type 1 DM who presents to the ED with hypoglycemia with history of multiple similar episodes leading to admission.   Hyperglycemia in setting of poorly controlled DM1--CBGs as high as 500 usually prior to lunch meals. Hx of multiple hypoglycemic episodes in the past and leading to admission (please see discussion below).  No more hypoglycemic episodes, but  now symptomatic hyperglycemia complaining of not feeling well and polyuria.  Now requiring titration of insulin, continued diabetic education, carbohydrate control on daily diet until stable CBGs.   -added Lantus 3 units in AM and continue Lantus 5 units in PM -customized meal coverage to start if CBGs>250; difficult to place on standing meal coverage as recommended by diabetes educator as patient does not consistently eat, at times eats everything on tray plus snacks, and at times will not eat much at all.   Hypoglycemia in setting of insulin use/misuse--history of  multiple episodes of hypoglycemia with ED visits and hospitalizations. Most likely secondary to exogenous and improper insulin use. Other causes of hypoglycemia were also considered including poor PO intake, insulinoma (less likely due to low blood insulin level). Insulin autoimmune hypoglycemia may a possible etiology as well.  She was recently discharged from the hospital on 4/11 with 5u lantus QAM without a sliding scale. Her last C-peptide level was less than 0.10 on 4/10 and her random blood insulin level was 3 during the last admission. No more hypoglycemic episodes, however, now having hyperglycemic episodes and symptomatic.   -continue lantus and titrate as necessary -CBGs monitoring -hypoglycemic protocol  -consider consult endocrinology, however, due to the Easter holiday, all offices currently closed in the area and no on call endocrinologist at this time.  Did call [facility] endocrinology office, [facility],  And Dr. Ophelia Charter, offices all closed today. Also tried to touch based with [facility] office and informed that their physicians do not come to the hospital.    Diabetes mellitus type 1, uncontrolled, insulin dependent--no hypoglyemic episodes overnight.  Last Hgb A1C was 10.2 in February 2014. Her most recent office visit shows that her mealtime coverage was discontinued in setting of hypoglycemic events.  -cont 3u in am AND 5u lantus in PM daily  -customized meal time coverage to start if CBGs >250, may change to start at 200 if hyperglycemia continues, with caution for rapid hypoglycemia -monitor closely for exogenous administration--concern from prior admission and events leading to admission; insulin vial removed by RN from personal belongings  Mental Disorder--unidentified.  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  Used to follow at mental health but does not  anymore.  Has been on Risperdal in the past per pcp.  Hx of crack cocaine abuse.   -will likely need outpatient psych follow up, used to follow at [facility], consider continuing at d/c -determined to have capacity by psych but lacking organization skills to follow diabetic regimen.  She does agree to SNF. Psych does not determine competence, that is done by a court.   Tracheostomy dependent-chronic respiratory failure  She is now s/p removal of trach all weekend, claims she was cleaning it this weekend and when she went to put back in her site was closed.  Currently talking and eating well, no complaints of dysphagia.  Per PCP, when she last spoke to Dr. 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   -continue to monitor  -discussed with Dr. Pollyann Kennedy on the telephone during this admission--  Will monitor closely for now, maybe she can get by without it, however, if respiratory status changes, stridor present, will need to contact him and take back to OR   Hx of hypothyroidism: currently not on any medications.  TSH 10/24/12: 5.939 and prior TSH on 05/20/12 was 2.106 with free T4 of 0.71 on 11/14/11.    -  follow up T3/T4 level as outpatient with PCP recommended and consider treatment as needed   F/E/N  -Mild Hyponatremia, 134 on admission-asymptomatic. Will monitor BMET  -tolerating carb modified low per nutrition diet without complaint -cont ensure and iron supplementation  -nutrition consult--calorie count initiated, continue glucerna shake.  Unintended weight loss ~14%   DVT px  -lovenox    Dispo: d/c to SNF is recommended as only safe dispo option at this time.  We discussed with social work, English as a second language teacher, again today and also with Dr. Jacky Kindle in detail over the telephone.  Dr. Jacky Kindle also discussed with attending, [name], about limited disposition options for SNF if any, however, will still try to place at local SNF with hopeful 30 day letter of guarantee and endocrinology follow up  at facility.  D/c to home with [facility] seems unlikely as she is currently no longer trach dependent and independent in ADLs and has had multiple hospital admissions for hypoglycemia that are life threatening; thus would be unsafe.  Personal Care Services may be available in the long run when she is safe to return to her home with necessary assistance but will need to be evaluated first if appropriate.  Ms. Schreiter is still willing to go to SNF and requests that it be near her home.  [facility] was proposed as an option but may be unavailable at this time as well? If all else fails, perhaps transfer to outside accepting facility with available endocrinology services could be an option?  The patient does have a current PCP Blanch Media, MD), therefore will be requiring [facility] follow-up after discharge.  The patient does have transportation limitations that hinder transportation to clinic appointments.  Services Needed at time of discharge: Y = Yes, Blank = No PT:   OT:   RN:   Equipment:   Other:     LOS: 5 days   [name] 11/01/2012, 7:13 AM

## 2012-11-01 NOTE — Progress Notes (Signed)
Nsg Discharge Note  Admit Date:  10/27/2012 Discharge date: 11/01/2012   [name] to be D/C'd Nursing Home per MD order.  AVS completed.  Copy for chart, and copy for patient signed, and dated. Patient/caregiver able to verbalize understanding.  Discharge Medication:   Medication List    TAKE these medications       ACCU-CHEK FASTCLIX LANCETS Misc  1 each by Does not apply route QID. Check blood sugar before meals and bedtime and as needed for symptoms of low blood sugar. Dx code 250.03 insulin dependent     albuterol (2.5 MG/3ML) 0.083% nebulizer solution  Commonly known as:  PROVENTIL  Take 3 mLs (2.5 mg total) by nebulization every 6 (six) hours as needed for wheezing.     albuterol 108 (90 BASE) MCG/ACT inhaler  Commonly known as:  PROVENTIL HFA;VENTOLIN HFA  Inhale 2 puffs into the lungs every 4 (four) hours as needed for wheezing or shortness of breath.     glucose 4 GM chewable tablet  Chew 4 tablets (16 g total) by mouth as needed for low blood sugar.     dextrose 40 % Gel  Commonly known as:  GLUTOSE  Take 1 Tube by mouth as needed (low blood sugar).     divalproex 125 MG DR tablet  Commonly known as:  DEPAKOTE  Take 1 tablet (125 mg total) by mouth daily.     feeding supplement Liqd  Take 237 mLs by mouth daily as needed (suboptimal oral intake).     ferrous sulfate 325 (65 FE) MG tablet  Take 325 mg by mouth daily with breakfast.     glucose blood test strip  Commonly known as:  ACCU-CHEK SMARTVIEW  Check blood sugar before meals and bedtime and as needed for symptoms of low blood sugar. Dx code 250.03 insulin dependent     insulin aspart 100 unit/ml Soln  Commonly known as:  novoLOG  Inject 2 Units into the skin 3 (three) times daily with meals. HOLD FOR EATING <50% OF MEAL     insulin glargine 100 UNIT/ML injection  Commonly known as:  LANTUS  Inject 0.04 mLs (4 Units total) into the skin at bedtime.     insulin glargine 100 UNIT/ML injection   Commonly known as:  LANTUS  Inject 0.05 mLs (5 Units total) into the skin every morning.     INSULIN SYRINGE .3CC/31GX5/16" 31G X 5/16" 0.3 ML Misc  DX Code: 250.03 Inject insulin once a day.     latanoprost 0.005 % ophthalmic solution  Commonly known as:  XALATAN  Place 1 drop into both eyes at bedtime.     multivitamin with minerals Tabs  Take 1 tablet by mouth daily.        Discharge Assessment: Filed Vitals:   11/01/12 1500  BP: 122/78  Pulse: 86  Temp: 98.3 F (36.8 C)  Resp: 18   Skin clean, dry and intact without evidence of skin break down, no evidence of skin tears noted. IV catheter discontinued intact. Site without signs and symptoms of complications - no redness or edema noted at insertion site, patient denies c/o pain - only slight tenderness at site.  Dressing with slight pressure applied.  D/c Instructions-Education: Discharge instructions given to patient/family with verbalized understanding. D/c education completed with patient/family including follow up instructions, medication list, d/c activities limitations if indicated, with other d/c instructions as indicated by MD - patient able to verbalize understanding, all questions fully answered. Patient instructed to return to  ED, call 911, or call MD for any changes in condition.  Patient escorted via WC, and D/C home via private auto.  Elion Hocker Consuella Lose, RN 11/01/2012 4:32 PM

## 2012-11-01 NOTE — Progress Notes (Signed)
Met with patient at the request of patient's nurse due to patient upset and refusing to discharge to [facility] skilled nursing facility. Patient verbalized that she has had prior experiences with nursing homes and absolutely will not go to another one. She prefers to go home with her family. After continued conversation in regards to physician's discharge orders and physician concerns in regards to patient discharging home this evening, patient stated that she would stay tonight and would like to speak with both the physician and social worker first thing in the morning in regards to discharging home with her family. Physician paged, currently awaiting return call to provide an update. Bedside nurse present and aware of patient's plans.

## 2012-11-01 NOTE — Progress Notes (Signed)
Call received from Dr Shirlee Latch in follow up on patient. Update provided regarding conversation with patient verbalizing that she prefers not to go to a skilled nursing facility and would like to go home with home health. Dr Shirlee Latch made me aware that she and another physician had spoken with patient earlier this evening in regards to recommendations regarding skilled nursing facility placement and that the patient will be discharging home against medical advice. Bedside nurse provided with an update.

## 2012-11-01 NOTE — Progress Notes (Signed)
Patient's Blood glucose was 479. Dr. Eather Colas orders to be given for additional insulin.

## 2012-11-01 NOTE — Clinical Social Work Placement (Signed)
     Clinical Social Work Department CLINICAL SOCIAL WORK PLACEMENT NOTE 11/01/2012  Patient:  [name], [name]  Account Number:  [id number] Admit date:  10/27/2012  Clinical Social Worker:  Rozetta Nunnery, Theresia Majors  Date/time:  11/01/2012 02:59 PM  Clinical Social Work is seeking post-discharge placement for this patient at the following level of care:   SKILLED NURSING   (*CSW will update this form in Epic as items are completed)   10/29/2012  Patient/family provided with [facility] Department of Clinical Social Works list of facilities offering this level of care within the geographic area requested by the patient (or if unable, by the patients family).  10/29/2012  Patient/family informed of their freedom to choose among providers that offer the needed level of care, that participate in Medicare, Medicaid or managed care program needed by the patient, have an available bed and are willing to accept the patient.  10/29/2012  Patient/family informed of [facility] ownership interest in [facility], as well as of the fact that they are under no obligation to receive care at this facility.  PASARR submitted to EDS on 05/29/2012 PASARR number received from EDS on 05/29/2012  FL2 transmitted to all facilities in geographic area requested by pt/family on  10/29/2012 FL2 transmitted to all facilities within larger geographic area on 10/30/2012  Patient informed that his/her managed care company has contracts with or will negotiate with  certain facilities, including the following:     Patient/family informed of bed offers received:  11/01/2012 Patient chooses bed at [facility] Physician recommends and patient chooses bed at    Patient to be transferred to [facility] on  11/01/2012 Patient to be transferred to facility by Ambulance  The following physician request were entered in Epic:   Additional Comments:

## 2012-11-01 NOTE — Progress Notes (Signed)
[facility]  [facility] was not planning to see her at the time discharge.  It was determined that she was not safe to be at home and there was nothing our staff could do to make that any better.   Wynelle Bourgeois 11/01/2012, 10:00 AM

## 2012-11-04 ENCOUNTER — Telehealth: Payer: Self-pay | Admitting: Licensed Clinical Social Worker

## 2012-11-04 ENCOUNTER — Telehealth: Payer: Self-pay | Admitting: Dietician

## 2012-11-04 NOTE — Telephone Encounter (Signed)
[name] most likely will not qualify for personal care services.  But, [facility] should be able to provide [facility] RN services for her diabetes education as she has had a medication change.  EMR states pt left hospital AMA, refusing placement at her preferred facility when they were able to take her.

## 2012-11-04 NOTE — Telephone Encounter (Signed)
[name] returned call to CSW.  Pt states she is in need of PCS through [employer].  CSW informed [name] this request was attempted some time ago and pt declined during assessment stating she was independent with ADL's.  CSW inquired as to what services pt needed assistance.  [name] replies "Since I had this tubal ligation in my throat, I need help dressing, cleaning".  CSW informed [name] PCP will need to agree with request and will need to be faxed in to [facility].  Discussed with [name], [facility] will contact pt to schedule an assessment.  Pt notified and voiced understanding.  Pt requesting phone number to Teodora Medici, [facility].  CSW provided number from D. Plyler telephone note.  CSW inquired if [name] wanted [facility] to resume services for diabetes education.  Pt states "Yes, I want them to come".  CSW will await for Home Health Order per PCP.

## 2012-11-04 NOTE — Telephone Encounter (Signed)
CSW placed called to pt.  CSW left message requesting return call. CSW provided contact hours and phone number.  CSW placed call to pt, left message regarding needed or requested home health services.  Pt d/c from hospital AMA.  [facility] had prior order for [facility] RN.  [facility] came out one time prior to pt's hospitalization.  CSW placed call to pt to inquire if pt wanted to resume [facility] RN services for diabetes education and teaching.

## 2012-11-04 NOTE — Telephone Encounter (Signed)
Message left by patient today "My birthday is [birth year], birthday today, Happy [place] Ouch to you! I need make doctor appointment, I'm at [facility], I need Medicaid,  they didn't give me  discharge papers from [facility] room [facility], I need a doctor's appointment as soon as possible." .  I used somebody's phone, [phone]'  Called mother and she said that [name] had gone to [facility] to pick up discharge papers and she'd let her know I called. gave me Vianney's phone number: [phone]"  Called patient and could not understand her at all, but could hear sounds. Store attendant got on phone and told me he was helping her get minutes on her phone and she got excited and he was telling her to calm down so i could understand her. I asked him to have her call me back as soon as possible.

## 2012-11-04 NOTE — Telephone Encounter (Signed)
I left message with Ms. Renwick at all number on chart to see if pt would like to resume [facility] RN for diabetes education.  If pt would like to resume services, pt will need new order.  CSW will await to see pt's preference.

## 2012-11-05 ENCOUNTER — Other Ambulatory Visit: Payer: Self-pay | Admitting: Licensed Clinical Social Worker

## 2012-11-05 DIAGNOSIS — E1065 Type 1 diabetes mellitus with hyperglycemia: Secondary | ICD-10-CM

## 2012-11-05 NOTE — Telephone Encounter (Signed)
Received another message from pt asking for an appointment. Tried to call pt with appointment with doctor for next week and to see if she can come in this week to see CDE. Mailbox is full- unable to contact her or leave a message.

## 2012-11-05 NOTE — Telephone Encounter (Signed)
I signed the order that you sent. Thanks

## 2012-11-05 NOTE — Telephone Encounter (Signed)
Did she end up going to [facility]???   I do not have any appts soon. She can see any [facility] resident - long appt as will be HFU. Thanks

## 2012-11-06 ENCOUNTER — Telehealth: Payer: Self-pay | Admitting: Dietician

## 2012-11-06 ENCOUNTER — Telehealth: Payer: Self-pay | Admitting: *Deleted

## 2012-11-06 NOTE — Addendum Note (Signed)
Addendum created 11/06/12 1027 by Dana Allan, MD   Modules edited: Clinical Notes   Clinical Notes:  File: [id number]; File: [id number]

## 2012-11-06 NOTE — Telephone Encounter (Signed)
Pt left messages: "i don't have any of my medicine, 30 cc needles, chemstrips, i need my medication, call me, my blood sugar 442, i don't know how much insulin to take, they did not give me discharge papers, [facility] does not have the papers, call me back. "    Called patient back with triage nurse to check on her status. Left message for her to call us.

## 2012-11-06 NOTE — Telephone Encounter (Signed)
Patient was difficult to understand, so she put the dietitian on the phone Luther Parody Rhom with [facility]). Caitlin related appointments to patient and offered her an appointment this week with CDE if felt needed. She assisted patient with reporting blood sugars: 119 this am, 69- had juice, 397,282,119,165. Veena will call transportation for appointments next week. Catlin given CDE phone number and will call for questions and to assist with coordination of care as needed.

## 2012-11-06 NOTE — Telephone Encounter (Signed)
Call from Robin, nurse with [facility] [phone] Nurse called stating she is the admission nurse and have had trouble getting in touch with pt for assessment. Today nurse went to house and pt was leaving and stated she had an appointment. Nurse will try again tomorrow.

## 2012-11-06 NOTE — Care Management Note (Signed)
    Page 1 of 1   11/06/2012     3:28:58 PM   CARE MANAGEMENT NOTE 11/06/2012  Patient:  [name], [name]   Account Number:  [id number]  Date Initiated:  10/30/2012  Documentation initiated by:  [facility]  Subjective/Objective Assessment:   hypoglycemia     Action/Plan:   SNF vs HH   Anticipated DC Date:  10/30/2012   Anticipated DC Plan:  HOME W HOME HEALTH SERVICES  In-house referral  Clinical Social Worker      DC Planning Services  CM consult      Choice offered to / List presented to:             [facility] agency  [facility]   Status of service:  Completed, signed off Medicare Important Message given?   (If response is "NO", the following Medicare IM given date fields will be blank) Date Medicare IM given:   Date Additional Medicare IM given:    Discharge Disposition:  AGAINST MEDICAL ADVICE  Per UR Regulation:  Reviewed for med. necessity/level of care/duration of stay  If discussed at Long Length of Stay Meetings, dates discussed:    Comments:  10/30/2012 1630 Pt has services with [facility] and will need resumption of care orders at dc. SNF placement and CSW sent bed offers. Isidoro Donning RN CCM Case Mgmt phone [phone]

## 2012-11-06 NOTE — Telephone Encounter (Signed)
Thanks. Will forward to [employer]

## 2012-11-06 NOTE — Progress Notes (Addendum)
Late Entry - Anesthesiologist Progress Note  Evaluation and Management in MAU  10/11/12 1040 - called to MAU for "patient with a trach who is unresponsive and seizing"  Anesthesia Evaluation  Patient unresponsive  Reviewed:  Unable to perform ROS - Chart review only    Airway  Comment: trach  Dental    Pulmonary  former smoker,  Trach H/o TB Cardiovascular    Neuro/Psych  Seizures - (focal seizure activity in progress), PSYCHIATRIC DISORDERS  GI/Hepatic  GERD- ,(+)  substance abuse  alcohol use and cocaine use, esophagitis    Endo/Other  diabetes, Type 1Blood sugar 11 on my arrival to MAU - has received IM glucose  Renal/GU  negative genitourinary    Musculoskeletal  Abdominal    Peds  Hematology  (+) anemia ,    Anesthesia Other Findings  Reproductive/Obstetrics     Anesthesia Physical  Anesthesia Plan  ASA: IV and emergent  Anesthesia Plan:  Post-op Pain Management:  Induction:  Airway Management Planned:  Additional Equipment:  Intra-op Plan:  Post-operative Plan:  Informed Consent:  Plan Discussed with:  Anesthesia Plan Comments: (Consulted for IV access - tenuous 22 ga IV obtained in left arm after multiple attempts (sclerotic veins with extensive track marks throughout). Recommended transfer to [facility] or [facility] ED via [facility]. Suggested NGT for gastric administration of glucose - but after two doses of IM glucose patient became responsive, alert and appropriate and stabilized. CBG came up to 150 by the time [facility] arrived to transport. Jasmine December, MD)       Made recommendations re: management of hypoglycemia, transfer of care to [facility] ED.  Obtained IV access.   10/11/12 1110 - [facility] here to transport to [facility] ED.  Total time spent with patient: 30 mins.

## 2012-11-07 ENCOUNTER — Encounter: Payer: Self-pay | Admitting: Licensed Clinical Social Worker

## 2012-11-07 ENCOUNTER — Telehealth: Payer: Self-pay | Admitting: Dietician

## 2012-11-07 NOTE — Telephone Encounter (Signed)
Thanks for helping her with this. She was not provided discharge papers bc she left - refused to go the SNF, refused to wait for D/C, refused to sign out AMA. I have no doubt something along lines of "you can't stay another night" was said by someone but we had arranged appropriate D/C plans that Talullah then backed out of at the last minute.

## 2012-11-07 NOTE — Progress Notes (Unsigned)
Patient ID: [name], female   DOB: [birth year], 47 y.o.   MRN: [id number] CSW received message from D. Plyler. Pt states she is having difficulty setting up transportation for several appt's through [facility] [phone]).  [name] left message stating her blood sugar machine broke the night of 11/06/12, she has no more strips, and family member threw her medications away.  CSW sent CMIS message to [facility] care manager to request follow up of blood sugar machine, strip, medications and schedule appt with [facility] care for [facility] interview.  CSW placed call to [facility]. Transportation for 4/25 and 4/30 appt's set up and confirmed, added pharmacy information per pt's request. CSW placed call to [name], pt notified of the following.  Pt states she wrote all information down. 4/25 - Transportation will pick pt up between 6:55am-7:35 am.  Pick up time from [facility] 9:30 am.  Transportation will take pt from [facility] to [facility] on [address], with a wait time of 10 mins.  If pt has any new prescriptions from [facility], must be called in prior.  Pt aware she will need confirmation sheets faxed to [facility] [phone] from both [facility] and pharmacy.  If not pharmacy drop/wait is needed.  Pt will need to call and notify [facility] [phone], to cancel. 4/30 - Transportation will pick pt up at 7:40 am.  Pick up time from appt will be 11:30 am.  No pharmacy drop off for this appt.   Pt notified CSW indicated pt may have person to accompany her to both appt's.  Pt informed [facility] will attempt visit today.  Pt states she is home and has no appt's today.

## 2012-11-07 NOTE — Telephone Encounter (Signed)
Received multi[ple messages from patient that she is out of chemstrips, her machine is not working to test her blood sugar since last night and she needs our address and phone number for transportation to drug store and for her appointment next week with Dr. Thad Ranger. Will collaborate with CSW and triage nurse to assist patient. Patient will need strips to troubleshoot meter, I am not sure Medicaid will pay for meter, patient may need to come to office for assistane with meter.

## 2012-11-07 NOTE — Telephone Encounter (Signed)
CMIS message sent to [facility], M. Johnican.  Transportation has been set up for 4/25 appt and pharmacy and 4/30appt, pt notified. See CSW note for more information.

## 2012-11-07 NOTE — Telephone Encounter (Signed)
Discussed with social work and triage nurse: plan is to have patient contact [place] with [facility] to assist patient in the home. Called patient and gave her Maretha's cell number [phone] and encouraged her to call her now and see if Rozelle Logan can come to her house this morning to assist her with her meter and medications. Patient again asks for discharge papers, medications, chemstrips and help with meter that is not working, she says she left the hospital because she was told she "could not stay another night".

## 2012-11-08 ENCOUNTER — Encounter: Payer: Self-pay | Admitting: Internal Medicine

## 2012-11-08 ENCOUNTER — Ambulatory Visit (INDEPENDENT_AMBULATORY_CARE_PROVIDER_SITE_OTHER): Payer: Medicaid Other | Admitting: Obstetrics & Gynecology

## 2012-11-08 ENCOUNTER — Telehealth: Payer: Self-pay | Admitting: *Deleted

## 2012-11-08 ENCOUNTER — Encounter: Payer: Self-pay | Admitting: Obstetrics & Gynecology

## 2012-11-08 ENCOUNTER — Other Ambulatory Visit (HOSPITAL_COMMUNITY)
Admission: RE | Admit: 2012-11-08 | Discharge: 2012-11-08 | Disposition: A | Discharge status: Home / Self Care | Payer: Medicaid Other | Source: Ambulatory Visit | Attending: Obstetrics & Gynecology | Admitting: Obstetrics & Gynecology

## 2012-11-08 ENCOUNTER — Other Ambulatory Visit: Payer: Self-pay | Admitting: Internal Medicine

## 2012-11-08 ENCOUNTER — Encounter: Payer: Self-pay | Admitting: Licensed Clinical Social Worker

## 2012-11-08 VITALS — BP 124/74 | HR 109 | Temp 98.7°F

## 2012-11-08 DIAGNOSIS — Z01419 Encounter for gynecological examination (general) (routine) without abnormal findings: Secondary | ICD-10-CM | POA: Insufficient documentation

## 2012-11-08 DIAGNOSIS — Z1151 Encounter for screening for human papillomavirus (HPV): Secondary | ICD-10-CM | POA: Insufficient documentation

## 2012-11-08 DIAGNOSIS — Z0000 Encounter for general adult medical examination without abnormal findings: Secondary | ICD-10-CM

## 2012-11-08 NOTE — Telephone Encounter (Signed)
Pls tell her I will fill this once but that I am not using it for a medical reason (and last level was undectable) so she needs to discuss this with mental health as it can also be used for a mental health condition.

## 2012-11-08 NOTE — Telephone Encounter (Signed)
Pt informed and voices understanding 
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
## 2012-11-08 NOTE — Telephone Encounter (Signed)
Receive call from Robin-RN with [phone] Nurse was able to admission visit with patient yesterday, nurse was unable to find iron, vitamin and Depakote medication in the home. Prescription refill for Depakote was requested today.

## 2012-11-08 NOTE — Progress Notes (Signed)
Here for annual exam. Only able to say few words, nods head yes/no, brought list of meds. Did not stand for weight, but able to transfer to exam table. Has history recent trach, is SOB.

## 2012-11-08 NOTE — Progress Notes (Signed)
Patient ID: [name], female   DOB: 1966/06/22, 47 y.o.   MRN: [id number] 11/07/2012 Note from [facility] Manager, [name] through CMIS: [name] just called me. I'm going to see her in about 30 min. I went & did a home visit this morning around 11:00am. The nurse from [facility] was there doing an assessment. It was discovered that pt. does have strips for her glucometer; batteries were readjusted & the machine did work. Pt. may need batteries soon. Her CBG was 289mg /dL during visit. Pt. had the following meds in her possession: Albuterol Sulfate, Lantus Insulin & Proair Inhaler. She didn't have her Ferrous Sulfate, Divalproex, Ibuprofen, Multivitamin & Latanoprost Eye drops. Says, her mother misplaced her medications while cleaning. Pt's mother denies having messed w/ her medications in any way. Says, she is unable to make sure pt. has her Lantus insulin daily, as prescribed; as pt. is oftentimes away from home when her insulin is due. Mother still verbalizes her desire for pt. to have alternate placement. Says, she has health conditions of her own & she is being stressed out by pt's behavior & non-compliance.

## 2012-11-12 NOTE — Addendum Note (Signed)
Addended by: Neomia Dear on: 11/12/2012 07:44 AM   Modules accepted: Orders

## 2012-11-13 ENCOUNTER — Encounter: Payer: Self-pay | Admitting: Dietician

## 2012-11-13 ENCOUNTER — Ambulatory Visit: Payer: Medicaid Other | Admitting: Internal Medicine

## 2012-11-13 ENCOUNTER — Ambulatory Visit (INDEPENDENT_AMBULATORY_CARE_PROVIDER_SITE_OTHER): Payer: Medicaid Other | Admitting: Dietician

## 2012-11-13 VITALS — Ht 60.75 in | Wt 105.3 lb

## 2012-11-13 DIAGNOSIS — IMO0002 Reserved for concepts with insufficient information to code with codable children: Secondary | ICD-10-CM

## 2012-11-13 DIAGNOSIS — E1065 Type 1 diabetes mellitus with hyperglycemia: Secondary | ICD-10-CM

## 2012-11-13 NOTE — Progress Notes (Signed)
Diabetes Self Management Training:  Appt start time: 930 end time:  1030. Last visit 12/2011 Blood sugar per patient's meter at today's visit was 311 after breakfast of oatmeal, 1/2 chicken salad sandwich and applesauce this am and sever hypoglycemia  Assessment:  Primary concerns today: Blood sugar control.  Support: Patient here with Meredith Mody. She is calm and concerned. Denies other needs today Medications:  Darnette reports that patient has been taking 1- 5 injections each day of lantus for past week. Weight is within normal limits.  Self Monitoring: Meter downloaded: average is 227 of 88 checks in past 30 days, low of 33 this am and high of 510. She had 2 < 50mg /dl, 4 between 78-$[id number]$$[id number]$ /dl, 31 between 37-628 and 51 > 180mg /dl   Meal planning: Usual eating pattern includes 3 meals and 1-3 snacks per day.She is trying to eat lower carb foods.  Physical activity includes Activities of daily living.  Progress Towards Goal(s):  In progress.   Educational Plan:   Nutrition Medication Self Monitoring Acute Complications   [place]-2.2 Altered nutrition-related laboratory As related to Type 1 diabetes and the need to match rapid acting insulin to carb intake.  As evidenced by Patient's meter download and high a1C.    Interventions today:   1- Medication:  Discussed adjustment to current insulin doses with attending physician. Patient & attending agree to new regimen: 7 units lantus in the morning and 5 units of lantus in the evening.  2- Nutrition education about low carb  Foods except when blood sugar < 120, picture book provided - has [facility] dietitian to reinforce education in the home 3- Self monitoring: target numbers and  how to record information in logbook 4- Coordination of care- discussed patient with social work, front office regarding follow up with physician and attending physician. Recommend set insulin doses 2 times a day at sage amounts to prevent lows, tolerate some highs and less  variability.  5- Acute complications: patient given 4 rolls of Smarties and encouraged to use these as they dissolve in her mouth faster than glucose tablets and information on how to handle hypoglycemia with food and hyperglycemia with food and water  Monitoring/Evaluation:  Dietary intake, exercise, blood sugars, and body weight in 1-3 week(s) dependnig on diabetes control at next week's visit.

## 2012-11-13 NOTE — Patient Instructions (Addendum)
You and [name] are doing a wonderful job controlling your blood sugars!  Continue with low carb foods (eggs, cheese, lunchmeat, peanut butter, carrots, celery,vegetable juice, tofu)  unless your blood sugar is less than 120, then you need to eat carbs.  Take 7 units Lantus in the morning Take 5 units Lantus in the evening  Write down in your book Time/Blood sugar/Lantus in the morning( between 8-10 am)  and in the evening. (6-8 Pm)

## 2012-11-15 ENCOUNTER — Telehealth: Payer: Self-pay | Admitting: Dietician

## 2012-11-15 NOTE — Telephone Encounter (Signed)
Patient left message that blood sugar is 494. Left message for return call, if after hours, to call resident on call #.

## 2012-11-19 ENCOUNTER — Ambulatory Visit (INDEPENDENT_AMBULATORY_CARE_PROVIDER_SITE_OTHER): Payer: Medicaid Other | Admitting: Internal Medicine

## 2012-11-19 ENCOUNTER — Encounter: Payer: Self-pay | Admitting: Licensed Clinical Social Worker

## 2012-11-19 VITALS — BP 108/71 | HR 93 | Temp 97.1°F | Wt 101.6 lb

## 2012-11-19 DIAGNOSIS — E1065 Type 1 diabetes mellitus with hyperglycemia: Secondary | ICD-10-CM

## 2012-11-19 LAB — POCT GLYCOSYLATED HEMOGLOBIN (HGB A1C): Hemoglobin A1C: 8.9

## 2012-11-19 MED ORDER — INSULIN GLARGINE 100 UNIT/ML ~~LOC~~ SOLN: 5.0000 [IU] | Freq: Every day | SUBCUTANEOUS | Status: DC

## 2012-11-19 MED ORDER — INSULIN GLARGINE 100 UNIT/ML ~~LOC~~ SOLN: 7.0000 [IU] | Freq: Every morning | SUBCUTANEOUS | Status: DC
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
## 2012-11-19 NOTE — Progress Notes (Signed)
Patient ID: [name], female   DOB: 1965-10-09, 47 y.o.   MRN: [id number] During pt's [facility] appt today, pt discussed with physician of not having [facility] assessment.  CSW placed call to [facility], pt's [facility] agency of choice.  [facility] has not initiated care and states they will f/u with [name] regarding scheduling assessment with [facility] care.

## 2012-11-19 NOTE — Patient Instructions (Signed)
Thank you for coming in today!  Please continue to take your insulin only as prescribed. If you have any extra insulin at home, please throw it away or bring it to [place] and we will discard it.  Schedule an appointment to see [place] back in 2 months.

## 2012-11-19 NOTE — Assessment & Plan Note (Signed)
Patient continues to have hypoglycemic episodes as recorded in her glucometer which she brought to the visit today. Her home regimen is 7 units of Lantus in the morning, 5 units of Lantus in the evening. We recognize that this is not an ideal regimen for a type I diabetic, however, with patient's history of suspected misuse of insulin and the high mortality associated with hypoglycemia, hyperglycemia is preferred to hypoglycemia. Therefore, we will keep her at her home regimen as it stands right now without any fast acting or mealtime insulin.  -Continue current regimen with Lantus 7 units in the morning, 5 units at night -Reviewed signs and symptoms of hypoglycemia and encouraged her to keep juice or glucose tabs at hand

## 2012-11-20 NOTE — Progress Notes (Signed)
Case discussed with Dr. Kesty at the time of the visit.  We reviewed the resident's history and exam and pertinent patient test results.  I agree with the assessment, diagnosis and plan of care documented in the resident's note. 

## 2012-11-21 ENCOUNTER — Telehealth: Payer: Self-pay | Admitting: Dietician

## 2012-11-21 NOTE — Telephone Encounter (Signed)
This is a late entry for call received yesterday: patient called to tell us that her blood sugar was 406 at 2:41 Pm yesterday after she ate apples, half a sausage and gravy biscuit and drank milk and water. She report her blood sugar had been 119 at 1:20 before she ate this food and 116 at 3:42 am. Confirmed that she has been taking her lantus as directed: 7 units in am and 5 unit in PM. She reports no symptoms other than increased urination. Explained to pt. hat we are okay with some high blood sugar as long as she is not feeling bad and not having lows. She verbalized understanding and also told me that she is currently trying to arrange a meeting with [place].

## 2012-11-22 ENCOUNTER — Telehealth: Payer: Self-pay | Admitting: Dietician

## 2012-11-22 NOTE — Telephone Encounter (Signed)
Left message about day and time of appointment next week. Also encouraged patient to use hospital operator and have resident on call for after hours and weekend concerns about her blood sugars.

## 2012-11-25 ENCOUNTER — Telehealth: Payer: Self-pay | Admitting: Dietician

## 2012-11-25 NOTE — Telephone Encounter (Signed)
She got a 6 month supply of iron in Feb. She needs to check with her pharmacy.  I had told her before that I am no longer Rx'ing the Depakote as there is no medical need. I have instructed her to go to a mental health provider of her choice as it can also be used for mental health conditions.

## 2012-11-25 NOTE — Telephone Encounter (Signed)
Left message on ID phone recording of message on 2 meds per Dr Rogelia Boga. To call [facility] if any questions.

## 2012-11-25 NOTE — Telephone Encounter (Signed)
Patient called about transportation- they are refusing to service her until they receive faxed notices about her April appointments. Faxed notice for 4/30 appointment, and gave her number to [facility].    Patient also asked about refills on depakote and iron - will ask triage nurse to follow up on these.

## 2012-11-27 ENCOUNTER — Telehealth: Payer: Self-pay | Admitting: Dietician

## 2012-11-27 NOTE — Telephone Encounter (Signed)
Patti, [facility] nurse called to inform us that patient's blood sugar is 309.

## 2012-11-28 ENCOUNTER — Telehealth: Payer: Self-pay | Admitting: *Deleted

## 2012-11-28 ENCOUNTER — Ambulatory Visit (INDEPENDENT_AMBULATORY_CARE_PROVIDER_SITE_OTHER): Payer: Medicaid Other | Admitting: Dietician

## 2012-11-28 VITALS — Ht 61.25 in | Wt 104.5 lb

## 2012-11-28 DIAGNOSIS — E1065 Type 1 diabetes mellitus with hyperglycemia: Secondary | ICD-10-CM

## 2012-11-28 DIAGNOSIS — IMO0002 Reserved for concepts with insufficient information to code with codable children: Secondary | ICD-10-CM

## 2012-11-28 NOTE — Telephone Encounter (Signed)
Yes she can cont for assessment, med compliance, and DM teaching. Thanks

## 2012-11-28 NOTE — Patient Instructions (Addendum)
Please make a follow up with [name] In 2 weeks.  You are doing a fantastic job of handling your blood sugars.  Try have a evening snack 7-9 PM- to stop the low blood sugar at bedtime- 9-11 PM.   When you are ready to switch to lantus pens- please tell your doctor.   Your average blood sugar is 206 today- your weight is 104.5#!! Both aqre where they should be!

## 2012-11-28 NOTE — Progress Notes (Signed)
Diabetes Self Management Training:  Appt start time: 830am end time:  930am. Last visit 11/13/2012 Blood sugar per patient's meter at today's visit was  273 after breakfast. Was 138 fasting today    Assessment:  Primary concerns today: Blood sugar control.  Support: Patient here with Meredith Mody. She is calm and concerned. Denies other needs today Medications:  Issabela reports taking 2 injections fo 7 units lantus qam and 5 units qpm, yet book shows 2 units this am and (difficult to read) what appears as 7 units, 10 units and 14 units of lantus for past 3 mornings.  Weight is improved and within normal limits, patient appears well nourished.   Self Monitoring: Meter downloaded: average is 206.4 of 92 checks in past 30 days, low of 33 this am and high of 494. She had 10 < 50mg /dl, 4 between 40-$[id number]$ /dl, 32 between 02-542 and 46 > 180mg /dl   Meal planning: Usual eating pattern includes 3 meals and 1-3 snacks per day.She continues to try to eat lower carb foods.  Physical activity includes Activities of daily living.has difficulty breathing if she has to ambulate long distances therefore is using a wheelchair today.  Progress Towards Goal(s):  In progress.   Educational Plan:   Nutrition- emphasis on eating lower carb and glycemic index foods Medication-  Self Monitoring Acute Complications   [place]-2.2 Altered nutrition-related laboratory As related to Type 1 diabetes and the need to match rapid acting insulin to carb intake.  As evidenced by Patient's meter download and high a1C.    Interventions today:   1- Medication:  Discussed being consistent in insulin doses and using insulin pen today. Patient able to demonstrate use of pen and accurate drawing up of 2 units .  2- Nutrition education about low carb  Foods except when blood sugar < 120, picture book provided  3- Self monitoring: plan to reinformce how to record information in logbook 4- Coordination of care- discussed patient with physician.    Monitoring/Evaluation:  Dietary intake, exercise, blood sugars, and body weight in 2 week(s).  Faxed transportation sheet.

## 2012-11-28 NOTE — Telephone Encounter (Signed)
Call from  [place], [place] with [facility] [phone] Nurse called in asking for Verbal Orders to continue seeing pt for 2 more weeks.  Spoke with Patty, RN asking for 2 more visits   Pt is living in boarding house with dogs and cats, nurse reports this is not a good environment.    She will see pt to continue assessing pt, medication compliance and monitoring diabetic teaching.  I gave the VO to continue for 2 more visits.   Is this okay with you?

## 2012-11-29 ENCOUNTER — Encounter: Payer: Self-pay | Admitting: Licensed Clinical Social Worker

## 2012-11-29 NOTE — Progress Notes (Signed)
Patient ID: [name], female   DOB: 05-11-1966, 47 y.o.   MRN: [id number] CSW received call from [name] from [name] stating [name] had not received PCS request.  CSW placed call to [name] to follow up.  [name] states they needed to return call to CSW as they did not have any follow up notes.  CSW requested [name] to call [name] [name] regarding follow up.  Nicole received call from [name].  [name] had initial PCS request from [name] but did not have record of 10/2012 PCS request.  CSW re-faxed 10/2012 request indicating second time faxed.  Informed [name] and fax copy sent to Nicole.

## 2012-12-02 ENCOUNTER — Telehealth: Payer: Self-pay | Admitting: *Deleted

## 2012-12-02 NOTE — Telephone Encounter (Signed)
Donna p. Calls and states she is on vacation , she checked her messages and pt had called and left her a message that blood sugar was low, i called pt and she states her blood sugar is 539. She is ask to call 911, she then states that her mother and daughter are coming over to care for her, she is advised to have them call the clinic for problems or 911, she is agreeable

## 2012-12-02 NOTE — Telephone Encounter (Signed)
Thank you. Agree.

## 2012-12-06 ENCOUNTER — Other Ambulatory Visit: Payer: Self-pay | Admitting: *Deleted

## 2012-12-06 DIAGNOSIS — Z930 Tracheostomy status: Secondary | ICD-10-CM

## 2012-12-06 MED ORDER — ALBUTEROL SULFATE HFA 108 (90 BASE) MCG/ACT IN AERS: 2.0000 | INHALATION_SPRAY | RESPIRATORY_TRACT | Status: DC | PRN

## 2012-12-06 MED ORDER — ALBUTEROL SULFATE (2.5 MG/3ML) 0.083% IN NEBU: 2.5000 mg | INHALATION_SOLUTION | Freq: Four times a day (QID) | RESPIRATORY_TRACT | Status: DC | PRN

## 2012-12-06 NOTE — Telephone Encounter (Signed)
Call from Forde Dandy from [facility] Nurse was able to see pt today for nurse visit. She is asking for a social work consult.  She needs help with transportation ( scat ), also needs help finding housing.  She is i a boarding house that has cats and odors.  RN # [phone]  I put in for med refill.  Pt out of inhaler

## 2012-12-09 ENCOUNTER — Other Ambulatory Visit: Payer: Self-pay

## 2012-12-09 ENCOUNTER — Emergency Department (HOSPITAL_COMMUNITY)
Admission: EM | Admit: 2012-12-09 | Discharge: 2012-12-10 | Disposition: A | Discharge status: Home / Self Care | Payer: Medicaid Other | Attending: Emergency Medicine | Admitting: Emergency Medicine

## 2012-12-09 ENCOUNTER — Encounter (HOSPITAL_COMMUNITY): Payer: Self-pay | Admitting: *Deleted

## 2012-12-09 ENCOUNTER — Emergency Department (HOSPITAL_COMMUNITY): Payer: Medicaid Other

## 2012-12-09 DIAGNOSIS — Z79899 Other long term (current) drug therapy: Secondary | ICD-10-CM | POA: Insufficient documentation

## 2012-12-09 DIAGNOSIS — Z862 Personal history of diseases of the blood and blood-forming organs and certain disorders involving the immune mechanism: Secondary | ICD-10-CM | POA: Insufficient documentation

## 2012-12-09 DIAGNOSIS — Z8639 Personal history of other endocrine, nutritional and metabolic disease: Secondary | ICD-10-CM | POA: Insufficient documentation

## 2012-12-09 DIAGNOSIS — R5381 Other malaise: Secondary | ICD-10-CM | POA: Insufficient documentation

## 2012-12-09 DIAGNOSIS — Z8669 Personal history of other diseases of the nervous system and sense organs: Secondary | ICD-10-CM | POA: Insufficient documentation

## 2012-12-09 DIAGNOSIS — I10 Essential (primary) hypertension: Secondary | ICD-10-CM | POA: Insufficient documentation

## 2012-12-09 DIAGNOSIS — Z87891 Personal history of nicotine dependence: Secondary | ICD-10-CM | POA: Insufficient documentation

## 2012-12-09 DIAGNOSIS — Z794 Long term (current) use of insulin: Secondary | ICD-10-CM | POA: Insufficient documentation

## 2012-12-09 DIAGNOSIS — D509 Iron deficiency anemia, unspecified: Secondary | ICD-10-CM | POA: Insufficient documentation

## 2012-12-09 DIAGNOSIS — R4182 Altered mental status, unspecified: Secondary | ICD-10-CM | POA: Insufficient documentation

## 2012-12-09 DIAGNOSIS — Z8719 Personal history of other diseases of the digestive system: Secondary | ICD-10-CM | POA: Insufficient documentation

## 2012-12-09 DIAGNOSIS — Z8619 Personal history of other infectious and parasitic diseases: Secondary | ICD-10-CM | POA: Insufficient documentation

## 2012-12-09 DIAGNOSIS — Z8611 Personal history of tuberculosis: Secondary | ICD-10-CM | POA: Insufficient documentation

## 2012-12-09 DIAGNOSIS — N390 Urinary tract infection, site not specified: Secondary | ICD-10-CM

## 2012-12-09 DIAGNOSIS — E785 Hyperlipidemia, unspecified: Secondary | ICD-10-CM | POA: Insufficient documentation

## 2012-12-09 DIAGNOSIS — I2510 Atherosclerotic heart disease of native coronary artery without angina pectoris: Secondary | ICD-10-CM | POA: Insufficient documentation

## 2012-12-09 DIAGNOSIS — E1069 Type 1 diabetes mellitus with other specified complication: Secondary | ICD-10-CM | POA: Insufficient documentation

## 2012-12-09 DIAGNOSIS — Z930 Tracheostomy status: Secondary | ICD-10-CM | POA: Insufficient documentation

## 2012-12-09 DIAGNOSIS — E162 Hypoglycemia, unspecified: Secondary | ICD-10-CM

## 2012-12-09 LAB — GLUCOSE, CAPILLARY
Glucose-Capillary: 118 mg/dL — ABNORMAL HIGH (ref 70–99)
Glucose-Capillary: 154 mg/dL — ABNORMAL HIGH (ref 70–99)

## 2012-12-09 LAB — COMPREHENSIVE METABOLIC PANEL
AST: 30 U/L (ref 0–37)
Albumin: 3.9 g/dL (ref 3.5–5.2)
Chloride: 99 mEq/L (ref 96–112)
Creatinine, Ser: 0.61 mg/dL (ref 0.50–1.10)
Total Bilirubin: 0.2 mg/dL — ABNORMAL LOW (ref 0.3–1.2)
Total Protein: 8.2 g/dL (ref 6.0–8.3)

## 2012-12-09 NOTE — ED Notes (Signed)
Patient arrived via EMS  Took her insulin but did not eat.  Patient responded to painful stimuli

## 2012-12-09 NOTE — ED Notes (Signed)
1/2 amp of D10 (12 grams) Glucagon 1mg  IM
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
## 2012-12-09 NOTE — ED Notes (Signed)
Patient transported to X-ray 

## 2012-12-10 LAB — CBC WITH DIFFERENTIAL/PLATELET
Basophils Absolute: 0 10*3/uL (ref 0.0–0.1)
Eosinophils Relative: 0 % (ref 0–5)
MCH: 20.6 pg — ABNORMAL LOW (ref 26.0–34.0)
Monocytes Absolute: 0.2 10*3/uL (ref 0.1–1.0)
Monocytes Relative: 2 % — ABNORMAL LOW (ref 3–12)
Neutrophils Relative %: 86 % — ABNORMAL HIGH (ref 43–77)
Platelets: 209 10*3/uL (ref 150–400)
RBC: 6.16 MIL/uL — ABNORMAL HIGH (ref 3.87–5.11)
Smear Review: ADEQUATE
WBC: 8.9 10*3/uL (ref 4.0–10.5)

## 2012-12-10 LAB — URINE MICROSCOPIC-ADD ON

## 2012-12-10 LAB — URINALYSIS, ROUTINE W REFLEX MICROSCOPIC
Glucose, UA: 250 mg/dL — AB
Hgb urine dipstick: NEGATIVE
Protein, ur: NEGATIVE mg/dL
Specific Gravity, Urine: 1.022 (ref 1.005–1.030)
pH: 7.5 (ref 5.0–8.0)

## 2012-12-10 LAB — TROPONIN I: Troponin I: <0.3 ng/mL (ref <0.30)

## 2012-12-10 MED ORDER — CEPHALEXIN 500 MG PO CAPS: 500.0000 mg | ORAL_CAPSULE | Freq: Four times a day (QID) | ORAL | Status: DC

## 2012-12-10 NOTE — ED Notes (Signed)
Pt drinking fluids with meal with no problems.

## 2012-12-10 NOTE — Telephone Encounter (Signed)
Rx for neb. Solution called in

## 2012-12-11 NOTE — Telephone Encounter (Signed)
These were just filled on 5/23. She just needs to call her pharmacy.

## 2012-12-11 NOTE — Telephone Encounter (Signed)
[name], this is for Home Health SW c/s.  We have already completed her [employer] application and I have spoken with [employer] personally.  She may have been denied.  Pt needs HH SW c/s.

## 2012-12-12 ENCOUNTER — Ambulatory Visit (INDEPENDENT_AMBULATORY_CARE_PROVIDER_SITE_OTHER): Payer: Medicaid Other | Admitting: Internal Medicine

## 2012-12-12 ENCOUNTER — Telehealth: Payer: Self-pay | Admitting: Dietician

## 2012-12-12 ENCOUNTER — Encounter: Payer: Self-pay | Admitting: Internal Medicine

## 2012-12-12 ENCOUNTER — Ambulatory Visit (INDEPENDENT_AMBULATORY_CARE_PROVIDER_SITE_OTHER): Payer: Medicaid Other | Admitting: Dietician

## 2012-12-12 ENCOUNTER — Other Ambulatory Visit: Payer: Self-pay | Admitting: Licensed Clinical Social Worker

## 2012-12-12 ENCOUNTER — Ambulatory Visit: Payer: Medicaid Other | Admitting: Dietician

## 2012-12-12 VITALS — BP 104/68 | HR 98 | Temp 96.5°F | Wt 101.1 lb

## 2012-12-12 DIAGNOSIS — F99 Mental disorder, not otherwise specified: Secondary | ICD-10-CM

## 2012-12-12 DIAGNOSIS — E1065 Type 1 diabetes mellitus with hyperglycemia: Secondary | ICD-10-CM

## 2012-12-12 DIAGNOSIS — E119 Type 2 diabetes mellitus without complications: Secondary | ICD-10-CM

## 2012-12-12 DIAGNOSIS — N390 Urinary tract infection, site not specified: Secondary | ICD-10-CM

## 2012-12-12 MED ORDER — INSULIN GLARGINE 100 UNIT/ML SOLOSTAR PEN: PEN_INJECTOR | SUBCUTANEOUS | Status: DC

## 2012-12-12 MED ORDER — CIPROFLOXACIN HCL 500 MG PO TABS: 500.0000 mg | ORAL_TABLET | Freq: Two times a day (BID) | ORAL | Status: DC

## 2012-12-12 MED ORDER — INSULIN PEN NEEDLE 32G X 4 MM MISC: Status: DC

## 2012-12-12 NOTE — Assessment & Plan Note (Addendum)
BUN and also is obtained on 5/26 which showed Escherichia coli with more 100.000 colonies. Patient was prescribed Keflex which she did not pick up at this point. I will prescribe Cipro.

## 2012-12-12 NOTE — Addendum Note (Signed)
Addended by: Almyra Deforest on: 12/12/2012 05:19 PM   Modules accepted: Orders, Medications
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
## 2012-12-12 NOTE — Telephone Encounter (Signed)
Send in new prescription for UTI

## 2012-12-12 NOTE — Patient Instructions (Addendum)
Please throw away all your insulin vials and needles at home.  Please use now the insulin pen: 7 units in the morning and 7 units before bedtime   DO NOT CHANGE THE DOSE EVEN IF YOUR BLOOD SUGARS ARE HIGH !!!!!! CALL us [phone]   Please bring your Glucose meter and your Lantus pen during each visit

## 2012-12-12 NOTE — Assessment & Plan Note (Addendum)
Instructed to take Lantus 7 units in the morning and 7 units in the evening. From today on patient will start to use Lantus pen. Diabetic educator [name] Plyler educated her how to use it. Furthermore we wrote down in her glucose log book instruction how much insulin to take. She was instructed to throw away her insulin vials at home. She should check her blood sugars on a regular basis but can not make any changes of Lantus dosage. She needs to call the clinic for instructions. We again  discussed in length about the importance of taking the insulin as instructed to avoid especially hypoglycemia. We will reevaluate patient in one week

## 2012-12-12 NOTE — Patient Instructions (Signed)
Take insulin as directed by physician using insulin pen.  Call our office with questions or concerns.   Write down times and blood sugar in logbook as instructed.

## 2012-12-12 NOTE — Telephone Encounter (Signed)
Patient left message that she is having trouble with transportation [phone]) [phone] scat. Speak with Sherri High- will ask social work to follow up. and also about medication for her kidneys [facility] did not have an antibiotic for her. Called [facility]: they didn't get Rx for antibiotic, she has refills on her iron.  Per conversation with pharmacist, patient has declined iron refills on multiple occasions due to co-pay. He expressed concern about her misuse of transportation for multiple trips to the pharmacy.

## 2012-12-12 NOTE — Progress Notes (Signed)
Diabetes Self Management Training:  Appt start time: 845am end time:  930am. Last visit 11/28/2012, was in ED for CBG of 14, per significant other she was at [facility] and he asked them to call EMS, log book indicated larger than instructed doses of lantus the previous day. CBG was 193 fasting today  Before visit and breakfast of cereal  Assessment:  Primary concerns today: Blood sugar control.  Support: Patient here with [name]. She is calm and concerned. They live at [address], [facility] RN has been making visits there. Medications:  Ayde reports taking 2 injections fo 7 units lantus qam and 5 units qpm, logbook shows various other dosed of lantus from 0 to 15 units.   Weight decreased 3.4#, patient appears well nourished.  Discussed using insulin pen with patient and physician. Patient demonstrated ability to assemble and use sample insulin pen given to her today. Asked to bring this pen to all visits and not keep in refrigerator.  Self Monitoring: Meter downloaded: average is 188 of 93 checks in past 30 days. >10% are hypoglycemic  Meal planning: Usual eating pattern includes 3 meals and 1-3 snacks per day.She continues to eat small portions often.  Physical activity includes Activities of daily living. Pursuing motorized wheelchair.  Progress Towards Goal(s):  In progress.   Educational Plan:   Nutrition- emphasis on eating small frequent meals of high nutrtional quality. Medication Self Monitoring-  Acute Complications-   [place]-2.2 Altered nutrition-related laboratory As related to Type 1 diabetes and the need to match rapid acting insulin to carb intake.  As evidenced by Patient's meter download and high a1C. is improving as evidenced by meter average    Interventions today:   1- Medication: Changed to insulin pen by physician today and giving 24 pen needles, instructed not to reuse needles or store pen with needle on it. This should improve accuracy of dosing, make following  amounts used more easily and to ensure she is using insulin in date. 2- Self Monitoring- meter showing E-9- installed two new batteries in her meter today 3- Acute Complications- ER visit for hypoglycemia, patient asked to call office for questions and concerns about her blood sugar and to NOT change insulin doses on her own.   4- Coordination of care- discussed patient with physician.   Monitoring/Evaluation:  Dietary intake, exercise, blood sugars, and body weight in 2 week(s).  Faxed transportation sheet.

## 2012-12-12 NOTE — ED Notes (Signed)
Post ED Visit - Positive Culture Follow-up  Culture report reviewed by antimicrobial stewardship pharmacist: []  Wes Dulaney, Pharm.D., BCPS []  Celedonio Miyamoto, Pharm.D., BCPS []  Georgina Pillion, Pharm.D., BCPS [x]  [place], [address].D., BCPS, AAHIVP []  Estella Husk, Pharm.D., BCPS, AAHIV  Positive urine culture Treated with Cephalexin ,organism sensitive to the same and no further patient follow-up is required at this time.  Larena Sox 12/12/2012, 5:48 PM

## 2012-12-12 NOTE — Progress Notes (Signed)
Case discussed with Dr. Jaseela Illath  at the time of the visit, immediately after the resident saw the patient.  I reviewed the resident's history and exam and pertinent patient test results.  I agree with the assessment, diagnosis and plan of care documented in the resident's note.   

## 2012-12-13 ENCOUNTER — Telehealth: Payer: Self-pay | Admitting: Dietician

## 2012-12-13 LAB — URINE CULTURE: Colony Count: >=100000

## 2012-12-13 NOTE — Telephone Encounter (Signed)
Patient called back to report blood sugar of 149

## 2012-12-13 NOTE — Telephone Encounter (Signed)
Patient calls to inform us that her blood sugar dropped last nigth and this morning.  12/12/12 @ 3:23 Pm - 80 mg/dL              @ 64:.40 Pm -56 mg/dL- ate peanut butter sand& juice and glucose tablets 5/30-14 @ 5:09 AM- 282 mg/dL- took 7 units lantus               Ate 1 cup cereal              @ 12:02 Pm 56 mg/dL- ate peanut butter juice and chicken noodle soup  Patient was difficult to understand, but think she reports taking 5 units lantus last night.  She agreed to re-check blood sugar and call office and leave message with re-check.

## 2012-12-13 NOTE — Progress Notes (Signed)
Opened in error

## 2012-12-13 NOTE — Telephone Encounter (Signed)
Per SCAT, pt missed SCAT interview appt.  Pt was not ready for schedule pick for appt.  Pt SCAT interview appt has been rescheduled for June 5th at 3:30 pm.  SCAT will notify pt on 6/4 as to pick up time, pt must be ready at the beginning of the pick up.  CSW will call pt and reiterate above.

## 2012-12-14 NOTE — Progress Notes (Signed)
Post ED Visit - Positive Culture Follow-up  Culture report reviewed by antimicrobial stewardship pharmacist: []  Wes Dulaney, Pharm.D., BCPS []  Celedonio Miyamoto, Pharm.D., BCPS []  Georgina Pillion, Pharm.D., BCPS []  [place], [place].D., BCPS, AAHIVP [x]  Estella Husk, Pharm.D., BCPS, AAHIVP  Positive Urine culture She was changed from Keflex to Cipro at her PCP appointment, organism sensitive to the same and no further patient follow-up is required at this time.  Estella Husk, Pharm.D., BCPS Clinical Pharmacist Phone: [phone] or [phone] Pager: [phone] 12/14/2012, 3:55 PM

## 2012-12-16 ENCOUNTER — Telehealth: Payer: Self-pay | Admitting: Dietician

## 2012-12-16 ENCOUNTER — Other Ambulatory Visit: Payer: Self-pay | Admitting: *Deleted

## 2012-12-16 NOTE — Telephone Encounter (Signed)
She was informed for her at her drug store. Patient then asked for iron. Patient informed that this is also available at her drug store. Patient then said someone took her inhaler and she needs an inhaler. Informed her that she has refills on her inhalers and to call [facility] to have them filled. Patient is difficult to understand on phone, she did not mention blood sugars or ER during phone conversation today.

## 2012-12-16 NOTE — Telephone Encounter (Signed)
I gave 6 month supply in Feb

## 2012-12-16 NOTE — Addendum Note (Signed)
Addended by: Neomia Dear on: 12/16/2012 09:10 AM   Modules accepted: Orders

## 2012-12-18 ENCOUNTER — Telehealth: Payer: Self-pay | Admitting: *Deleted

## 2012-12-18 NOTE — Telephone Encounter (Signed)
Pt called and stated her blood sugar is 467, triage nurse started asking pt questions about insulin, blood sugars when a female came on the line and told me i didn't need to talk to the pt, she would be ok, he then disconnected the call, i tried to call her back and the # has been disconnected

## 2012-12-18 NOTE — Telephone Encounter (Signed)
Thank you. I and everyone else has offered [name] assistance and she has refused. She is competent to do so.

## 2012-12-20 ENCOUNTER — Other Ambulatory Visit: Payer: Self-pay | Admitting: Internal Medicine

## 2012-12-20 ENCOUNTER — Ambulatory Visit (INDEPENDENT_AMBULATORY_CARE_PROVIDER_SITE_OTHER): Payer: Medicaid Other | Admitting: Dietician

## 2012-12-20 VITALS — Wt 98.7 lb

## 2012-12-20 DIAGNOSIS — E1065 Type 1 diabetes mellitus with hyperglycemia: Secondary | ICD-10-CM

## 2012-12-20 LAB — GLUCOSE, CAPILLARY: Glucose-Capillary: 217 mg/dL — ABNORMAL HIGH (ref 70–99)
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
## 2012-12-20 NOTE — Patient Instructions (Addendum)
You need an appointment with a doctor.  Please call [phone] (the front office) for an appointment  Your next appointment with [name] is : Monday, June 16th at 9:30AM  Continue to take 7 units lantus two times a day.   Remember: show your SCAT ID and you can ride the city bus for free.   For Medicare questions:  call [phone], they will need your social security number.

## 2012-12-30 ENCOUNTER — Emergency Department (HOSPITAL_COMMUNITY)
Admission: EM | Admit: 2012-12-30 | Discharge: 2012-12-30 | Disposition: A | Discharge status: Home / Self Care | Payer: Medicaid Other | Attending: Emergency Medicine | Admitting: Emergency Medicine

## 2012-12-30 ENCOUNTER — Encounter (HOSPITAL_COMMUNITY): Payer: Self-pay | Admitting: Neurology

## 2012-12-30 ENCOUNTER — Telehealth: Payer: Self-pay | Admitting: Dietician

## 2012-12-30 ENCOUNTER — Emergency Department (HOSPITAL_COMMUNITY)
Admission: EM | Admit: 2012-12-30 | Discharge: 2012-12-30 | Disposition: A | Discharge status: Home / Self Care | Payer: Medicaid Other | Source: Home / Self Care | Attending: Emergency Medicine | Admitting: Emergency Medicine

## 2012-12-30 DIAGNOSIS — E162 Hypoglycemia, unspecified: Secondary | ICD-10-CM

## 2012-12-30 DIAGNOSIS — Z930 Tracheostomy status: Secondary | ICD-10-CM | POA: Insufficient documentation

## 2012-12-30 DIAGNOSIS — Z8611 Personal history of tuberculosis: Secondary | ICD-10-CM | POA: Insufficient documentation

## 2012-12-30 DIAGNOSIS — I2510 Atherosclerotic heart disease of native coronary artery without angina pectoris: Secondary | ICD-10-CM | POA: Insufficient documentation

## 2012-12-30 DIAGNOSIS — Z8619 Personal history of other infectious and parasitic diseases: Secondary | ICD-10-CM | POA: Insufficient documentation

## 2012-12-30 DIAGNOSIS — I10 Essential (primary) hypertension: Secondary | ICD-10-CM | POA: Insufficient documentation

## 2012-12-30 DIAGNOSIS — R5381 Other malaise: Secondary | ICD-10-CM | POA: Insufficient documentation

## 2012-12-30 DIAGNOSIS — Z862 Personal history of diseases of the blood and blood-forming organs and certain disorders involving the immune mechanism: Secondary | ICD-10-CM | POA: Insufficient documentation

## 2012-12-30 DIAGNOSIS — E1069 Type 1 diabetes mellitus with other specified complication: Secondary | ICD-10-CM | POA: Insufficient documentation

## 2012-12-30 DIAGNOSIS — E785 Hyperlipidemia, unspecified: Secondary | ICD-10-CM | POA: Insufficient documentation

## 2012-12-30 DIAGNOSIS — R5383 Other fatigue: Secondary | ICD-10-CM | POA: Insufficient documentation

## 2012-12-30 DIAGNOSIS — G9340 Encephalopathy, unspecified: Secondary | ICD-10-CM | POA: Insufficient documentation

## 2012-12-30 DIAGNOSIS — Z79899 Other long term (current) drug therapy: Secondary | ICD-10-CM | POA: Insufficient documentation

## 2012-12-30 DIAGNOSIS — F1910 Other psychoactive substance abuse, uncomplicated: Secondary | ICD-10-CM | POA: Insufficient documentation

## 2012-12-30 DIAGNOSIS — Z794 Long term (current) use of insulin: Secondary | ICD-10-CM | POA: Insufficient documentation

## 2012-12-30 DIAGNOSIS — Z8639 Personal history of other endocrine, nutritional and metabolic disease: Secondary | ICD-10-CM | POA: Insufficient documentation

## 2012-12-30 DIAGNOSIS — Z8719 Personal history of other diseases of the digestive system: Secondary | ICD-10-CM | POA: Insufficient documentation

## 2012-12-30 DIAGNOSIS — D509 Iron deficiency anemia, unspecified: Secondary | ICD-10-CM | POA: Insufficient documentation

## 2012-12-30 DIAGNOSIS — IMO0002 Reserved for concepts with insufficient information to code with codable children: Secondary | ICD-10-CM | POA: Insufficient documentation

## 2012-12-30 DIAGNOSIS — Z87891 Personal history of nicotine dependence: Secondary | ICD-10-CM | POA: Insufficient documentation

## 2012-12-30 HISTORY — DX: Type 2 diabetes mellitus without complications: E11.9

## 2012-12-30 LAB — GLUCOSE, CAPILLARY: Glucose-Capillary: 289 mg/dL — ABNORMAL HIGH (ref 70–99)

## 2012-12-30 MED ORDER — GLUCOSE-VITAMIN C 4-6 GM-MG PO CHEW
CHEWABLE_TABLET | ORAL | Status: AC
  Filled 2012-12-30: qty 1

## 2012-12-30 MED ORDER — GLUCOSE 4 G PO CHEW
1.0000 | CHEWABLE_TABLET | Freq: Once | ORAL | Status: AC
  Administered 2012-12-30: 4 g via ORAL
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
## 2012-12-30 NOTE — ED Notes (Signed)
Paged IV team for IV start.

## 2012-12-30 NOTE — ED Notes (Signed)
Home aid Caesar Bookman and her boyfriend wanting more information regarding pts care.Pt has been refusing for blood test earlier but now has agreed for blood works.Nicole(PA) explained to the pt about her care and explained to them that pt needs a skilled nursing facility.Pt do not agree for the plan and refuses any help for her in this matter.

## 2012-12-30 NOTE — ED Notes (Signed)
Pt discharged.Viatl signs stable and GCS 15

## 2012-12-30 NOTE — Telephone Encounter (Signed)
Received call from [name] who said she was in the ED and wanted CDE assist with patient care. She also requested follow up on LIberty, PCS services. [name] informed CDE will have social work follow up upon her return. Will contact patient once home from ED to schedule CDE appointment for earlier this week.

## 2012-12-30 NOTE — ED Notes (Signed)
Per EMS- Pt comes from home, found altered mental status with cbg 32. Given glucagaon x 2. CBG upon arrival 85. Pt has trach, denies problems with trach. Pt is alert and oriented, agitated. Unable to get IV access.

## 2012-12-30 NOTE — ED Notes (Signed)
Blood sample not obtained as pt is difficulty access.Pt  Refused to have the IV team to try again.Pt wants to go home .
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
## 2012-12-30 NOTE — ED Notes (Signed)
IV team at bedside 

## 2012-12-30 NOTE — ED Notes (Signed)
Pt reports last night her CBG was over 400. Took 10 units of lantus, instead of her regular dose which is 7 units.

## 2012-12-30 NOTE — Telephone Encounter (Signed)
Can put on OPC MD sch but I do not really know what that will accomplish.

## 2012-12-30 NOTE — ED Notes (Signed)
Pt alert, oriented, talking to family member at bedside. IV team coming down to start IV.

## 2012-12-30 NOTE — ED Notes (Signed)
Unable to establish IV. Pt drinking apple juice, eating [place] sandwich.

## 2012-12-30 NOTE — ED Notes (Signed)
Per EMS- Pt comes from home, where she was found to be altered, cbg 32. Given 2 glucagon. CBG increased to 85 upon arrival. Pt has trach in place, denies problems with such. Pt is alert and oriented. Unable to establish IV.

## 2012-12-30 NOTE — ED Notes (Signed)
CBG 85

## 2012-12-30 NOTE — Telephone Encounter (Signed)
Pt has scheduled appointment 6/20

## 2012-12-31 NOTE — ED Provider Notes (Signed)
Medical screening examination/treatment/procedure(s) were conducted as a shared visit with non-physician practitioner(s) and myself.  I personally evaluated the patient during the encounter 47 yo diabetic woman, trach dependant, who has very limited vascular access, presented via EMS with hypoglycemia.  Her home health company's representative came in and was concerned that she was not getting properly evaluated.  RN's were unable to draw her blood due to her problem with vascular access.  Her blood sugar by CBG remained over 100 during her stay.  I do not think that she lacked for proper evaluation of her condition or care for it.  Additionally, she was offered referral to a skilled nursing facility, which she refused.   Carleene Cooper III, MD 12/31/12 [phone]

## 2012-12-31 NOTE — Telephone Encounter (Signed)
Thanks

## 2012-12-31 NOTE — Telephone Encounter (Signed)
Unable to leave a message.

## 2012-12-31 NOTE — Telephone Encounter (Signed)
Patient called to say her blood sugar is 349 right now and she doesn't feel good. She was somewhat agitated off and on during the conversation. She says it is now time to eat, but her blood sugar is too high to eat. Reminded her she could eat vegetables and protein foods and to drink plenty of water.  She reports that her blood sugar this am was 165 mg/dl and she took 7 units of lantus insulin this am using her insulin pen. She did eat breakfast. She said she dislikes the insulin pens because of the bubbles in them . Reminded Nayda to not keep the needle attached after using it for injection to prevent the bubbles from occuring.   Patient denies need for an appointment earlier than Friday, says she has transportation. She reports her daughter is staying with her now. She reported different amounts of lantus insulin between 5-6 units, 8-10 units,  7 units twice a day and 6-8 units at night. when reporting how much insulin she has been taking and did report that she took 10 units the night before her recent low blood sugar prior to her ED visit.   CDE reinforced that her doctor would rather her blood sugar be a little high than low. Patient verbalized dislike of how she felt with high blood sugars again.  Asked patient to bring in all insulin and we could try to help her get bubbles out and that I would refer this note to the doctor with whom she'd be meeting this Friday.

## 2013-01-03 ENCOUNTER — Encounter: Payer: Self-pay | Admitting: Internal Medicine

## 2013-01-03 ENCOUNTER — Ambulatory Visit (INDEPENDENT_AMBULATORY_CARE_PROVIDER_SITE_OTHER): Payer: Medicaid Other | Admitting: Internal Medicine

## 2013-01-03 VITALS — BP 132/84 | HR 109 | Temp 97.4°F | Wt 100.9 lb

## 2013-01-03 DIAGNOSIS — E1065 Type 1 diabetes mellitus with hyperglycemia: Secondary | ICD-10-CM

## 2013-01-03 DIAGNOSIS — IMO0002 Reserved for concepts with insufficient information to code with codable children: Secondary | ICD-10-CM

## 2013-01-03 MED ORDER — GLUCERNA SHAKE PO LIQD: 237.0000 mL | Freq: Every day | ORAL | Status: DC | PRN

## 2013-01-03 MED ORDER — DIVALPROEX SODIUM 125 MG PO DR TAB: DELAYED_RELEASE_TABLET | ORAL | Status: DC

## 2013-01-03 MED ORDER — FERROUS SULFATE 325 (65 FE) MG PO TABS: 325.0000 mg | ORAL_TABLET | Freq: Every day | ORAL | Status: DC

## 2013-01-03 MED ORDER — INSULIN GLARGINE 100 UNIT/ML SOLOSTAR PEN: PEN_INJECTOR | SUBCUTANEOUS | Status: DC
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
## 2013-01-03 NOTE — Patient Instructions (Addendum)
1. DO NOT TAKE MORE THAN 7 units of lantus in the morning and 7 units at night. 2. I will talk to our social worker about options for home health help. 3. Come back and see [name] on July 15th.

## 2013-01-03 NOTE — Progress Notes (Signed)
Case discussed with Dr. Ziemer at the time of the visit.  We reviewed the resident's history and exam and pertinent patient test results.  I agree with the assessment, diagnosis, and plan of care documented in the resident's note.  

## 2013-01-03 NOTE — Assessment & Plan Note (Addendum)
Again, I encouraged [name] NOT TO EXCEED 7U lantus BID. She continues to self-titrate her insulin dose leading to episodic profound hypoglycemia. I explained to her that it is OK for her sugar to be high, but that low sugars such as those that brought her to the ED on the 16th are very dangerous and could result in death. I again offered and encouraged SNF, she refused. It appears from meter review that when she does stick with lantus 7U BID she avoids perilously low hypoglycemia, at the expense of BGs in the 500s.  She asked about HH for help with meds. I think she may have had HH in the past. I will inquire with our CSW.  Appt w PCP 01/28/13 Case discussed w Dr. Meredith Pel.

## 2013-01-09 ENCOUNTER — Other Ambulatory Visit: Payer: Self-pay | Admitting: Internal Medicine

## 2013-01-09 ENCOUNTER — Other Ambulatory Visit: Payer: Self-pay | Admitting: *Deleted

## 2013-01-09 ENCOUNTER — Ambulatory Visit (INDEPENDENT_AMBULATORY_CARE_PROVIDER_SITE_OTHER): Payer: Medicaid Other | Admitting: Dietician

## 2013-01-09 VITALS — Ht 59.5 in | Wt 98.8 lb

## 2013-01-09 DIAGNOSIS — E119 Type 2 diabetes mellitus without complications: Secondary | ICD-10-CM

## 2013-01-09 DIAGNOSIS — N390 Urinary tract infection, site not specified: Secondary | ICD-10-CM

## 2013-01-09 DIAGNOSIS — E1065 Type 1 diabetes mellitus with hyperglycemia: Secondary | ICD-10-CM

## 2013-01-09 MED ORDER — INSULIN PEN NEEDLE 32G X 4 MM MISC: Status: DC

## 2013-01-09 NOTE — Telephone Encounter (Signed)
Tried to call pt but no answer and mailbox is full.  Will try again tomorrow.

## 2013-01-09 NOTE — Telephone Encounter (Signed)
Needs to get eye drops from optho. No ABX over phone - needs seen and UA.
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
## 2013-01-09 NOTE — Patient Instructions (Addendum)
Continue to take 7 units lantus twice a day. Do not take 10 or 12 units ever- this is causing low blood sugars.  Your weight is good, your blood sugars are more stable! Good Job!  [name] [phone]

## 2013-01-10 LAB — MICROALBUMIN / CREATININE URINE RATIO
Creatinine, Urine: 159.6 mg/dL
Microalb, Ur: 2.71 mg/dL — ABNORMAL HIGH (ref 0.00–1.89)

## 2013-01-18 ENCOUNTER — Emergency Department (HOSPITAL_COMMUNITY): Payer: Medicaid Other

## 2013-01-18 ENCOUNTER — Emergency Department (HOSPITAL_COMMUNITY)
Admission: EM | Admit: 2013-01-18 | Discharge: 2013-01-18 | Disposition: A | Discharge status: Home / Self Care | Payer: Medicaid Other | Attending: Emergency Medicine | Admitting: Emergency Medicine

## 2013-01-18 ENCOUNTER — Encounter (HOSPITAL_COMMUNITY): Payer: Self-pay | Admitting: Emergency Medicine

## 2013-01-18 DIAGNOSIS — Z8719 Personal history of other diseases of the digestive system: Secondary | ICD-10-CM | POA: Insufficient documentation

## 2013-01-18 DIAGNOSIS — R5381 Other malaise: Secondary | ICD-10-CM | POA: Insufficient documentation

## 2013-01-18 DIAGNOSIS — I2510 Atherosclerotic heart disease of native coronary artery without angina pectoris: Secondary | ICD-10-CM | POA: Insufficient documentation

## 2013-01-18 DIAGNOSIS — Z8659 Personal history of other mental and behavioral disorders: Secondary | ICD-10-CM | POA: Insufficient documentation

## 2013-01-18 DIAGNOSIS — D509 Iron deficiency anemia, unspecified: Secondary | ICD-10-CM | POA: Insufficient documentation

## 2013-01-18 DIAGNOSIS — R269 Unspecified abnormalities of gait and mobility: Secondary | ICD-10-CM | POA: Insufficient documentation

## 2013-01-18 DIAGNOSIS — I10 Essential (primary) hypertension: Secondary | ICD-10-CM | POA: Insufficient documentation

## 2013-01-18 DIAGNOSIS — R5383 Other fatigue: Secondary | ICD-10-CM | POA: Insufficient documentation

## 2013-01-18 DIAGNOSIS — Z8639 Personal history of other endocrine, nutritional and metabolic disease: Secondary | ICD-10-CM | POA: Insufficient documentation

## 2013-01-18 DIAGNOSIS — E1065 Type 1 diabetes mellitus with hyperglycemia: Secondary | ICD-10-CM | POA: Insufficient documentation

## 2013-01-18 DIAGNOSIS — J3489 Other specified disorders of nose and nasal sinuses: Secondary | ICD-10-CM | POA: Insufficient documentation

## 2013-01-18 DIAGNOSIS — Z862 Personal history of diseases of the blood and blood-forming organs and certain disorders involving the immune mechanism: Secondary | ICD-10-CM | POA: Insufficient documentation

## 2013-01-18 DIAGNOSIS — Z794 Long term (current) use of insulin: Secondary | ICD-10-CM | POA: Insufficient documentation

## 2013-01-18 DIAGNOSIS — Z8669 Personal history of other diseases of the nervous system and sense organs: Secondary | ICD-10-CM | POA: Insufficient documentation

## 2013-01-18 DIAGNOSIS — Z87891 Personal history of nicotine dependence: Secondary | ICD-10-CM | POA: Insufficient documentation

## 2013-01-18 DIAGNOSIS — R059 Cough, unspecified: Secondary | ICD-10-CM | POA: Insufficient documentation

## 2013-01-18 DIAGNOSIS — R0602 Shortness of breath: Secondary | ICD-10-CM | POA: Insufficient documentation

## 2013-01-18 DIAGNOSIS — R6883 Chills (without fever): Secondary | ICD-10-CM | POA: Insufficient documentation

## 2013-01-18 DIAGNOSIS — Z79899 Other long term (current) drug therapy: Secondary | ICD-10-CM | POA: Insufficient documentation

## 2013-01-18 DIAGNOSIS — IMO0002 Reserved for concepts with insufficient information to code with codable children: Secondary | ICD-10-CM | POA: Insufficient documentation

## 2013-01-18 DIAGNOSIS — R05 Cough: Secondary | ICD-10-CM | POA: Insufficient documentation

## 2013-01-18 DIAGNOSIS — Z8619 Personal history of other infectious and parasitic diseases: Secondary | ICD-10-CM | POA: Insufficient documentation

## 2013-01-18 DIAGNOSIS — Z8709 Personal history of other diseases of the respiratory system: Secondary | ICD-10-CM | POA: Insufficient documentation

## 2013-01-18 DIAGNOSIS — J069 Acute upper respiratory infection, unspecified: Secondary | ICD-10-CM | POA: Insufficient documentation

## 2013-01-18 LAB — CBC WITH DIFFERENTIAL/PLATELET
Basophils Absolute: 0 10*3/uL (ref 0.0–0.1)
Basophils Relative: 0 % (ref 0–1)
Eosinophils Absolute: 0 10*3/uL (ref 0.0–0.7)
Eosinophils Relative: 0 % (ref 0–5)
Hemoglobin: 10.1 g/dL — ABNORMAL LOW (ref 12.0–15.0)
Lymphs Abs: 1.9 10*3/uL (ref 0.7–4.0)
MCH: 20.3 pg — ABNORMAL LOW (ref 26.0–34.0)
Monocytes Absolute: 0.4 10*3/uL (ref 0.1–1.0)
Monocytes Relative: 8 % (ref 3–12)
Myelocytes: 0 %
Neutro Abs: 2.8 10*3/uL (ref 1.7–7.7)
Neutrophils Relative %: 55 % (ref 43–77)
Platelets: 206 10*3/uL (ref 150–400)
RBC: 4.97 MIL/uL (ref 3.87–5.11)
WBC: 5.1 10*3/uL (ref 4.0–10.5)
nRBC: 0 /100 WBC

## 2013-01-18 LAB — BASIC METABOLIC PANEL
CO2: 26 mEq/L (ref 19–32)
Calcium: 8.4 mg/dL (ref 8.4–10.5)
Creatinine, Ser: 0.65 mg/dL (ref 0.50–1.10)
GFR calc Af Amer: >90 mL/min (ref >90)
Sodium: 142 mEq/L (ref 135–145)

## 2013-01-18 MED ORDER — ACETYLCYSTEINE 20 % IN SOLN
3.0000 mL | Freq: Once | RESPIRATORY_TRACT | Status: AC
  Administered 2013-01-18: 3 mL via RESPIRATORY_TRACT
  Filled 2013-01-18: qty 4

## 2013-01-18 MED ORDER — PREDNISONE 20 MG PO TABS: 40.0000 mg | ORAL_TABLET | Freq: Every day | ORAL | Status: DC

## 2013-01-18 MED ORDER — IPRATROPIUM BROMIDE 0.02 % IN SOLN
0.5000 mg | Freq: Once | RESPIRATORY_TRACT | Status: AC
  Administered 2013-01-18: 0.5 mg via RESPIRATORY_TRACT
  Filled 2013-01-18: qty 2.5

## 2013-01-18 MED ORDER — ALBUTEROL SULFATE HFA 108 (90 BASE) MCG/ACT IN AERS: 2.0000 | INHALATION_SPRAY | RESPIRATORY_TRACT | Status: DC | PRN

## 2013-01-18 MED ORDER — ALBUTEROL SULFATE (5 MG/ML) 0.5% IN NEBU
2.5000 mg | INHALATION_SOLUTION | Freq: Once | RESPIRATORY_TRACT | Status: AC
  Administered 2013-01-18: 2.5 mg via RESPIRATORY_TRACT
  Filled 2013-01-18: qty 0.5

## 2013-01-18 MED ORDER — SODIUM CHLORIDE 0.9 % IV BOLUS (SEPSIS)
1000.0000 mL | Freq: Once | INTRAVENOUS | Status: AC
  Administered 2013-01-18: 1000 mL via INTRAVENOUS

## 2013-01-18 MED ORDER — METHYLPREDNISOLONE SODIUM SUCC 125 MG IJ SOLR
125.0000 mg | Freq: Once | INTRAMUSCULAR | Status: AC
  Administered 2013-01-18: 125 mg via INTRAVENOUS
  Filled 2013-01-18: qty 2

## 2013-01-18 MED ORDER — PREDNISONE 20 MG PO TABS
60.0000 mg | ORAL_TABLET | Freq: Once | ORAL | Status: DC
  Filled 2013-01-18: qty 3

## 2013-01-18 MED ORDER — ALBUTEROL SULFATE HFA 108 (90 BASE) MCG/ACT IN AERS
2.0000 | INHALATION_SPRAY | Freq: Once | RESPIRATORY_TRACT | Status: AC
  Administered 2013-01-18: 2 via RESPIRATORY_TRACT
  Filled 2013-01-18: qty 6.7

## 2013-01-18 MED ORDER — ALBUTEROL SULFATE (5 MG/ML) 0.5% IN NEBU
5.0000 mg | INHALATION_SOLUTION | Freq: Once | RESPIRATORY_TRACT | Status: AC
  Administered 2013-01-18: 5 mg via RESPIRATORY_TRACT
  Filled 2013-01-18: qty 1

## 2013-01-18 NOTE — ED Provider Notes (Signed)
47 year old female who had tracheostomy removed about 3 months ago comes in with cough and sore throat. She is generally deconditioned and is having difficulty raising sputum. On exam, she does have some intermittent stridor but has not actually in any respiratory distress. Her cough is noted to be very weak. On exam, there are a few scattered wheezes but reasonably good airflow. She's not improved with albuterol nebulizer. She'll be given a trial of acetylcysteine via nebulizer.  Medical screening examination/treatment/procedure(s) were conducted as a shared visit with non-physician practitioner(s) and myself.  I personally evaluated the patient during the encounter   Dione Booze, MD 01/18/13 [phone]

## 2013-01-18 NOTE — ED Notes (Signed)
Pt. Stated, sore throat, cough for 3 days, hard to breath
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
## 2013-01-24 ENCOUNTER — Encounter: Payer: Self-pay | Admitting: Neurology

## 2013-01-28 ENCOUNTER — Ambulatory Visit: Payer: Medicaid Other | Admitting: Internal Medicine

## 2013-01-31 ENCOUNTER — Telehealth: Payer: Self-pay | Admitting: Dietician

## 2013-01-31 NOTE — Telephone Encounter (Signed)
Called to follow up on blood sugars and left reminder of appointment on Monday, 02-03-13.

## 2013-02-03 ENCOUNTER — Ambulatory Visit (INDEPENDENT_AMBULATORY_CARE_PROVIDER_SITE_OTHER): Payer: Medicaid Other | Admitting: Internal Medicine

## 2013-02-03 ENCOUNTER — Encounter: Payer: Self-pay | Admitting: Internal Medicine

## 2013-02-03 VITALS — BP 123/78 | HR 91 | Temp 97.4°F | Wt 103.1 lb

## 2013-02-03 DIAGNOSIS — E1065 Type 1 diabetes mellitus with hyperglycemia: Secondary | ICD-10-CM

## 2013-02-03 LAB — GLUCOSE, CAPILLARY: Glucose-Capillary: 237 mg/dL — ABNORMAL HIGH (ref 70–99)

## 2013-02-03 MED ORDER — INSULIN PEN NEEDLE 32G X 4 MM MISC: Status: DC

## 2013-02-03 NOTE — Progress Notes (Signed)
Case discussed with Dr. Qureshi soon after the resident saw the patient.  We reviewed the resident's history and exam and pertinent patient test results.  I agree with the assessment, diagnosis, and plan of care documented in the resident's note. 
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
## 2013-02-03 NOTE — Patient Instructions (Addendum)
General Instructions:  You are doing great today! We will not change your insulin regimen and continue to monitor if your blood sugar is too high or too low.  Contact donna and the clinic like always if your sugars are too high or too low and if severe, you will need to go to the emergency room  We will need to check your a1c again next month  Treatment Goals:  Goals (1 Years of Data) as of 02/03/13         As of Today 01/18/13 01/18/13 01/18/13 01/03/13     Blood Pressure    . Blood Pressure < 140/90  123/78 125/64 123/62 116/64 132/84     Result Component    . HEMOGLOBIN A1C < 8.5          . LDL CALC < 100            Progress Toward Treatment Goals:  Treatment Goal 02/03/2013  Hemoglobin A1C unable to assess  Prevent falls at goal    Self Care Goals & Plans:  Self Care Goal 02/03/2013  Manage my medications -  Monitor my health keep track of my blood glucose; bring my glucose meter and log to each visit  Be physically active -    Home Blood Glucose Monitoring 02/03/2013  Check my blood sugar 3 times a day  When to check my blood sugar before meals    Care Management & Community Referrals:  Referral 09/10/2012  Referrals made for care management support social worker; diabetes educator; nutritionist  Referrals made to community resources falls prevention     Low Blood Sugar Low blood sugar (hypoglycemia) means that the level of sugar in your blood is lower than it should be. Signs of low blood sugar include:  Getting sweaty.  Feeling hungry.  Feeling dizzy or weak.  Feeling sleepier than normal.  Feeling nervous.  Headaches.  Having a fast heartbeat. Low blood sugar can happen fast and can be an emergency. Your doctor can do tests to check your blood sugar level. You can have low blood sugar and not have diabetes. HOME CARE  Check your blood sugar as told by your doctor. If it is less than 70 mg/dl or as told by your doctor, take 1 of the following:  3 to 4  glucose tablets.   cup clear juice.   cup soda pop, not diet.  1 cup milk.  5 to 6 hard candies.  Recheck blood sugar after 15 minutes. Repeat until it is at the right level.  Eat a snack if it is more than 1 hour until the next meal.  Only take medicine as told by your doctor.  Do not skip meals. Eat on time.  Do not drink alcohol except with meals.  Check your blood glucose before driving.  Check your blood glucose before and after exercise.  Always carry treatment with you, such as glucose pills.  Always wear a medical alert bracelet if you have diabetes. GET HELP RIGHT AWAY IF:   Your blood glucose goes below 70 mg/dl or as told by your doctor, and you:  Are confused.  Are not able to swallow.  Pass out (faint).  You cannot treat yourself. You may need someone to help you.  You have low blood sugar problems often.  You have problems from your medicines.  You are not feeling better after 3 to 4 days.  You have vision changes. MAKE SURE YOU:   Understand these instructions.  Will watch this condition.  Will get help right away if you are not doing well or get worse. Document Released: 09/27/2009 Document Revised: 09/25/2011 Document Reviewed: 09/27/2009 [facility] Patient Information 2014 [place], [place].  High Blood Sugar High blood sugar (hyperglycemia) means that the level of sugar in your blood is higher than it should be. Signs of high blood sugar include:  Feeling thirsty.  Frequent peeing (urinating).  Feeling tired or sleepy.  Dry mouth.  Vision changes.  Feeling weak.  Feeling hungry but losing weight.  Numbness and tingling in your hands or feet.  Headache. When you ignore these signs, your blood sugar may keep going up. These problems may get worse, and other problems may begin. HOME CARE  Check your blood sugars as told by your doctor. Write down the numbers with the date and time.  Take the right amount of insulin or  diabetes pills at the right time. Write down the dose with date and time.  Refill your insulin or diabetes pills before running out.  Watch what you eat. Follow your meal plan.  Drink liquids without sugar, such as water. Check with your doctor if you have kidney or heart disease.  Follow your doctor's orders for exercise. Exercise at the same time of day.  Keep your doctor's appointments. GET HELP RIGHT AWAY IF:   You have trouble thinking or are confused.  You have fast breathing with fruity smelling breath.  You pass out (faint).  You have 2 to 3 days of high blood sugars and you do not know why.  You have chest pain.  You are feeling sick to your stomach (nauseous) or throwing up (vomiting).  You have sudden vision changes. MAKE SURE YOU:   Understand these instructions.  Will watch your condition.  Will get help right away if you are not doing well or get worse. Document Released: 04/30/2009 Document Revised: 09/25/2011 Document Reviewed: 04/30/2009 [facility] Patient Information 2014 [place], [place].

## 2013-02-03 NOTE — Assessment & Plan Note (Signed)
Lab Results  Component Value Date   HGBA1C 8.9 11/19/2012   HGBA1C 10.2* 08/20/2012   HGBA1C 8.4 03/19/2012    Assessment: Diabetes control: fair control Progress toward A1C goal:  unable to assess Comments: recheck next month.  Appears to have improved control with less lows and less frequent hyperglycemia.  Counseled extensively again on monitoring cbgs and signs and symptoms of hypo and hyper glycemia.  Also met with [name] and will follow up with pcp and Nicole as well.   Plan: Medications:  continue current medications lantus pen, 7 units bid Home glucose monitoring: Frequency: 3 times a day Timing: before meals Instruction/counseling given: reminded to bring blood glucose meter & log to each visit and reminded to bring medications to each visit Educational resources provided:   Self management tools provided:   Other plans: follow up with CDE and pcp

## 2013-03-11 ENCOUNTER — Ambulatory Visit (INDEPENDENT_AMBULATORY_CARE_PROVIDER_SITE_OTHER): Payer: Medicaid Other | Admitting: Internal Medicine

## 2013-03-11 ENCOUNTER — Encounter: Payer: Self-pay | Admitting: Internal Medicine

## 2013-03-11 VITALS — BP 118/75 | HR 84 | Temp 97.0°F | Ht 60.5 in | Wt 108.0 lb

## 2013-03-11 DIAGNOSIS — F489 Nonpsychotic mental disorder, unspecified: Secondary | ICD-10-CM

## 2013-03-11 DIAGNOSIS — E1065 Type 1 diabetes mellitus with hyperglycemia: Secondary | ICD-10-CM

## 2013-03-11 DIAGNOSIS — E785 Hyperlipidemia, unspecified: Secondary | ICD-10-CM

## 2013-03-11 DIAGNOSIS — Z0000 Encounter for general adult medical examination without abnormal findings: Secondary | ICD-10-CM

## 2013-03-11 DIAGNOSIS — F99 Mental disorder, not otherwise specified: Secondary | ICD-10-CM

## 2013-03-11 LAB — GLUCOSE, CAPILLARY: Glucose-Capillary: 449 mg/dL — ABNORMAL HIGH (ref 70–99)

## 2013-03-11 LAB — POCT GLYCOSYLATED HEMOGLOBIN (HGB A1C): Hemoglobin A1C: 9.2

## 2013-03-11 NOTE — Assessment & Plan Note (Signed)
Not going to [facility]. I had intended not to fil the Depakote - see overview - but it was filled by another provider.

## 2013-03-11 NOTE — Assessment & Plan Note (Signed)
A1C is 9.2 this AM, lower than expected bc of freq lows. Her CBG this AM was in the 400's. She is checking on average 2 times daily. Her average middle of night is 295, AM fasting 152 - 192, noon 214, 5 PM 389, 6:30 PM 496, 8PM about 334, and 9:30 PM 444. She uses Lantus 7 units BID. We had stopped her Novolog 2/2 freq and life threatening hypoglycemia. However, she now has a daily nurse at 9AM. I had wondered whether the nurse could come in the afternoon and do DOT Novolog correction dose so that she didn't have such high afternoon values. But, he have ingrained into [name] that Novolog = low sugars that she was hesitant to try it and when [facility] called the home health agency, they would have had to change nurses in order to provide afternoon coverage and they were hesitant to "rock the boat". Therefore, leave as is Lantus 7 BID and accept poor control.  Refused pneumovax and flu

## 2013-03-11 NOTE — Assessment & Plan Note (Signed)
Her last LDL was not at goal. She was supposed to be on a statin but it was inappropriately removed from med list by a pharm tech and never resumed. She refuses FLP today. Therefore, will wait until next visit to get FLP.

## 2013-03-11 NOTE — Patient Instructions (Addendum)
You have test strip refills at your pharmacy Please continue to check your sugars

## 2013-03-11 NOTE — Assessment & Plan Note (Signed)
Refuses all vaccinations. 

## 2013-03-11 NOTE — Progress Notes (Signed)
  Subjective:    Patient ID: [name], female    DOB: 18-Nov-1965, 47 y.o.   MRN: [id number]  HPI  Please see the A&P for the status of the pt's chronic medical problems.   Review of Systems  Constitutional: Negative for activity change.  HENT: Positive for trouble swallowing. Negative for sore throat, rhinorrhea and sneezing.   Eyes: Negative for itching.  Respiratory: Positive for cough and shortness of breath.   Cardiovascular: Negative for chest pain.  Gastrointestinal: Negative for nausea, vomiting and constipation.  Endocrine: Positive for polyuria.  Genitourinary: Negative for difficulty urinating.  Musculoskeletal: Negative for arthralgias and gait problem.  Skin: Negative for rash.  Neurological: Positive for light-headedness and numbness. Negative for weakness.  Psychiatric/Behavioral: Negative for sleep disturbance.       Objective:   Physical Exam  Constitutional: She is oriented to person, place, and time. She appears well-developed and well-nourished. No distress.  HENT:  Head: Normocephalic and atraumatic.  Right Ear: External ear normal.  Left Ear: External ear normal.  Nose: Nose normal.  Eyes: Conjunctivae and EOM are normal.  Neck: Normal range of motion. Neck supple.  Cardiovascular: Normal rate, regular rhythm and normal heart sounds.   Pulmonary/Chest: Effort normal and breath sounds normal. No respiratory distress.  A lot of upper airway noise. Able to speak in full sentences  Musculoskeletal: Normal range of motion. She exhibits no edema and no tenderness.  Neurological: She is alert and oriented to person, place, and time.  Skin: Skin is warm and dry. She is not diaphoretic.  Psychiatric: She has a normal mood and affect. Her behavior is normal. Thought content normal.  Poor judgement, unable to adequately manage her DM          Assessment & Plan:

## 2013-03-24 ENCOUNTER — Other Ambulatory Visit: Payer: Self-pay | Admitting: *Deleted

## 2013-03-24 DIAGNOSIS — E1065 Type 1 diabetes mellitus with hyperglycemia: Secondary | ICD-10-CM

## 2013-03-25 MED ORDER — INSULIN GLARGINE 100 UNIT/ML SOLOSTAR PEN: PEN_INJECTOR | SUBCUTANEOUS | Status: DC

## 2013-03-25 NOTE — Telephone Encounter (Signed)
One yr's supply pen needles sent July 2014

## 2013-05-13 ENCOUNTER — Ambulatory Visit: Payer: Medicaid Other | Admitting: Internal Medicine

## 2013-05-22 ENCOUNTER — Other Ambulatory Visit: Payer: Self-pay

## 2013-06-03 ENCOUNTER — Encounter: Payer: Self-pay | Admitting: Internal Medicine

## 2013-06-03 ENCOUNTER — Ambulatory Visit (INDEPENDENT_AMBULATORY_CARE_PROVIDER_SITE_OTHER): Payer: Medicaid Other | Admitting: Internal Medicine

## 2013-06-03 VITALS — BP 116/76 | HR 69 | Temp 98.5°F | Ht 60.0 in | Wt 111.2 lb

## 2013-06-03 DIAGNOSIS — D509 Iron deficiency anemia, unspecified: Secondary | ICD-10-CM

## 2013-06-03 DIAGNOSIS — R269 Unspecified abnormalities of gait and mobility: Secondary | ICD-10-CM

## 2013-06-03 DIAGNOSIS — E785 Hyperlipidemia, unspecified: Secondary | ICD-10-CM

## 2013-06-03 DIAGNOSIS — E1065 Type 1 diabetes mellitus with hyperglycemia: Secondary | ICD-10-CM

## 2013-06-03 DIAGNOSIS — IMO0002 Reserved for concepts with insufficient information to code with codable children: Secondary | ICD-10-CM

## 2013-06-03 DIAGNOSIS — Z0000 Encounter for general adult medical examination without abnormal findings: Secondary | ICD-10-CM

## 2013-06-03 DIAGNOSIS — R1310 Dysphagia, unspecified: Secondary | ICD-10-CM

## 2013-06-03 LAB — LIPID PANEL
Cholesterol: 190 mg/dL (ref 0–200)
HDL: 88 mg/dL (ref >39)
LDL Cholesterol: 91 mg/dL (ref 0–99)
Total CHOL/HDL Ratio: 2.2 Ratio
Triglycerides: 56 mg/dL (ref <150)
VLDL: 11 mg/dL (ref 0–40)

## 2013-06-03 LAB — GLUCOSE, CAPILLARY: Glucose-Capillary: 270 mg/dL — ABNORMAL HIGH (ref 70–99)

## 2013-06-03 MED ORDER — FERROUS SULFATE 325 (65 FE) MG PO TABS: 325.0000 mg | ORAL_TABLET | Freq: Every day | ORAL | Status: DC

## 2013-06-03 NOTE — Assessment & Plan Note (Signed)
She is having dysphagia and states that she cannot eat corn or rice since she had a trach. She has no stridor today.  She and her sig other are living in a rooming house but there are irritants (animals, cleaning products) that irritate her. They are looking for their own place.   She has a nurse that comes in the AM. She is only able to take 4-5 steps before bc unstable. She is able to catch herself and has not fallen. She requires assistance with bath, dressing, cooking. She uses a manual WC when out of her home. She requests a motorized WC so I am referring to neuro rehab with [facility] for the eval.

## 2013-06-03 NOTE — Assessment & Plan Note (Signed)
Her statin was inappropriately removed by a pharm tech in 2014. I am checking a FLP prior to resuming the statin.

## 2013-06-03 NOTE — Assessment & Plan Note (Signed)
She has not been taking her iron so I will check a ferritin today.

## 2013-06-03 NOTE — Patient Instructions (Signed)
1. I will call you if there is any problem with your blood results 2. I sent your iron pills to the pharmacy 3. You have refills of all other meds and supplies at the pharmacy 4. Please call your eye doctor for the eye drops

## 2013-06-03 NOTE — Assessment & Plan Note (Addendum)
She has her meter today but it is not organized. Her download, shows 1.3 tests per day, average CBG 251, 65% hyperglycemic, and 8% hypoglycemic as low as 43. The lows are primarily in the early AM. She [place] uniformly high in the afternoon. She cont to adjust the amt of Lantus that she uses from 7-12 units despite being told to only use 7 BID. There is no good option to achieve better glycemic control.

## 2013-06-03 NOTE — Progress Notes (Signed)
  Subjective:    Patient ID: [name], female    DOB: Oct 06, 1965, 47 y.o.   MRN: [id number]  Diabetes Pertinent negatives for hypoglycemia include no dizziness or headaches. Pertinent negatives for diabetes include no chest pain.    Please see the A&P for the status of the pt's chronic medical problems.   Review of Systems  Constitutional: Negative for fever.  HENT: Positive for trouble swallowing. Negative for rhinorrhea.   Eyes: Negative for visual disturbance.  Respiratory: Positive for shortness of breath.   Cardiovascular: Negative for chest pain and leg swelling.  Gastrointestinal: Negative for abdominal pain, diarrhea and constipation.  Genitourinary: Negative for difficulty urinating.  Musculoskeletal: Negative for arthralgias and back pain.  Neurological: Negative for dizziness and headaches.  Psychiatric/Behavioral: Negative for sleep disturbance.       Objective:   Physical Exam  Constitutional: She is oriented to person, place, and time. She appears well-developed and well-nourished. No distress.  HENT:  Head: Normocephalic and atraumatic.  Right Ear: External ear normal.  Left Ear: External ear normal.  Nose: Nose normal.  Eyes: Conjunctivae and EOM are normal. Right eye exhibits no discharge. Left eye exhibits no discharge.  Cardiovascular: Normal rate, regular rhythm and normal heart sounds.   Pulmonary/Chest: Effort normal and breath sounds normal.  Abdominal: Soft.  Musculoskeletal: She exhibits no edema and no tenderness.  Neurological: She is alert and oriented to person, place, and time.  Skin: Skin is warm and dry. She is not diaphoretic.  Psychiatric: She has a normal mood and affect. Her behavior is normal. Judgment and thought content normal.          Assessment & Plan:

## 2013-07-24 ENCOUNTER — Ambulatory Visit: Payer: Medicaid Other | Attending: Internal Medicine | Admitting: Physical Therapy

## 2013-07-24 DIAGNOSIS — R262 Difficulty in walking, not elsewhere classified: Secondary | ICD-10-CM | POA: Insufficient documentation

## 2013-07-24 DIAGNOSIS — IMO0001 Reserved for inherently not codable concepts without codable children: Secondary | ICD-10-CM | POA: Insufficient documentation

## 2013-07-31 ENCOUNTER — Encounter: Payer: Self-pay | Admitting: Dietician

## 2013-07-31 ENCOUNTER — Other Ambulatory Visit: Payer: Self-pay | Admitting: Internal Medicine

## 2013-07-31 ENCOUNTER — Ambulatory Visit (INDEPENDENT_AMBULATORY_CARE_PROVIDER_SITE_OTHER): Payer: Medicaid Other | Admitting: Dietician

## 2013-07-31 VITALS — Ht 61.5 in | Wt 117.3 lb

## 2013-07-31 DIAGNOSIS — E1065 Type 1 diabetes mellitus with hyperglycemia: Secondary | ICD-10-CM

## 2013-07-31 DIAGNOSIS — IMO0002 Reserved for concepts with insufficient information to code with codable children: Secondary | ICD-10-CM

## 2013-07-31 LAB — POCT GLYCOSYLATED HEMOGLOBIN (HGB A1C): HEMOGLOBIN A1C: 10.6

## 2013-07-31 LAB — GLUCOSE, CAPILLARY: Glucose-Capillary: 382 mg/dL — ABNORMAL HIGH (ref 70–99)

## 2013-07-31 NOTE — Patient Instructions (Addendum)
Eat fruit instead of drinking juice- drink diet clear sodas like diet ginger ale, crystal lite, unsweetened tea- use tea bags that are better for you and less expensive  Do Not take lantus more than two times a day.  Here are foods you can eat when you are hungry that do not raise your blood sugar as much as other foods: Keep them around all the time to snack on.  Meat roll up- Cream cheese rolled up with ham Any kind of cheese- buy cheese sticks Broccoli and cheese Avocados Peanut butter tunafish salad with mayonaise Pimento cheese Egg salad Cottage cheese Any type of non starchy vegetable- pureed it and add cheese Bowl of greens and onions in olive oil Greek yogurt   Please make an appointment with Dr. Rogelia BogaButcher in February and me in March.

## 2013-07-31 NOTE — Progress Notes (Signed)
Medical Nutrition Therapy:  Appt start time: 1030 end time:  1130. Last visit 12/2012 Assessment:  Primary concerns today: Blood sugar control.  Weight is increased 6# since 05/2013, 19# since last visit. A1C is increased. Current weight is appropriate and healthy- encourage her to maintain. Diabetes Medications: has been taking varying amounts of lantus with average amount/day ~ 29 range of 14-36 reports awoke with seizure like activity by significant other in middle of night after the 36 units which she had dosed in 3 separate doses throughout the day because her sugars were high. Adjusting lantus dose based on blood sugar on daily basis. Blood sugars: meter download and copy of patient logbook in doctor's box, average is 242, range is 54 to 561, ran out of strips for ~ 5 days Usual eating pattern includes 3 meals and 1-2 snacks per day. Patient says she is hungry often, avoids foods that tend to get stick on her throat like nuts, dry foods, rice). 24-hr recall: B ( 8-930 AM)-  Toast, cereal, milk, water, bacon or egg sandwich, juice and milk ( 3-4 carb servings/45-60 grams)     L ( 1230  PM)- ham sandwich, milk  & orange or hot dog, juice and chips or pork & beans, 1 slice bread and juice or water (3-4 servings/45-60 grams carb Snk ( 2-PM)-  oz orange juice or fruit punch (1 carb serving/ 15 grams carb) D ( 5-PM)-  Sandwich with beans and fruit punch or fish or chicken,  Bread,  vegetables, fruit ( 2-4 servings carb/30-60 grams) Snk ( PM)- cheese, water, pack of 4 peanut butter crackers (~1 carb serving/15 grams)  Usual physical activity includes Activities of daily living- patient is active and walks a lot.  Progress Towards Goal(s):  No progress.   Nutritional Diagnosis:  NB-1.6 Limited adherence to nutrition-related recommendations As related to adjusting doses of lantus insulin accoring to blood sugar level.  As evidenced by her report and her logbook with doses recorded. .    Intervention:   Nutrition education and reeducation about decreasing carbohydrate.intake and taking consistent amounts of insulin as instructed to improve blood sugars. Coordination of care-discussed with physician that patient unlikely to stop trying to address high blood sugars, consider short trial of safe second line diabetes therapy vs placebo that will allow patient  To address postprandial elevations in blood glucose.  Monitoring/Evaluation:  Dietary intake, exercise, blood sugars/meter and insulin intake, and body weight in 2 month(s).

## 2013-08-05 ENCOUNTER — Telehealth: Payer: Self-pay | Admitting: Dietician

## 2013-08-06 NOTE — Telephone Encounter (Signed)
Meter is working now. She came to office yesterday and someone helped her fix it. Her sugar was 165 this am. She reports she is drinking less juice more unsweetened tea, water, diet ginger ale and not taking more than two shots a day.

## 2013-09-02 ENCOUNTER — Ambulatory Visit: Payer: Medicaid Other | Admitting: Internal Medicine

## 2013-09-16 ENCOUNTER — Other Ambulatory Visit: Payer: Self-pay | Admitting: Internal Medicine

## 2013-09-16 ENCOUNTER — Telehealth: Payer: Self-pay | Admitting: Dietician

## 2013-09-16 NOTE — Telephone Encounter (Signed)
Informed patient that prescription sent and pharmacy is processing it. She should call them to see when it will be ready for pick up

## 2013-09-17 ENCOUNTER — Emergency Department (HOSPITAL_COMMUNITY)
Admission: EM | Admit: 2013-09-17 | Discharge: 2013-09-17 | Disposition: A | Discharge status: Home / Self Care | Payer: Medicaid Other | Attending: Emergency Medicine | Admitting: Emergency Medicine

## 2013-09-17 ENCOUNTER — Encounter (HOSPITAL_COMMUNITY): Payer: Self-pay | Admitting: Emergency Medicine

## 2013-09-17 ENCOUNTER — Emergency Department (HOSPITAL_COMMUNITY): Payer: Medicaid Other

## 2013-09-17 DIAGNOSIS — IMO0002 Reserved for concepts with insufficient information to code with codable children: Secondary | ICD-10-CM | POA: Insufficient documentation

## 2013-09-17 DIAGNOSIS — Z8611 Personal history of tuberculosis: Secondary | ICD-10-CM | POA: Insufficient documentation

## 2013-09-17 DIAGNOSIS — W010XXA Fall on same level from slipping, tripping and stumbling without subsequent striking against object, initial encounter: Secondary | ICD-10-CM | POA: Insufficient documentation

## 2013-09-17 DIAGNOSIS — Z8619 Personal history of other infectious and parasitic diseases: Secondary | ICD-10-CM | POA: Insufficient documentation

## 2013-09-17 DIAGNOSIS — Z8659 Personal history of other mental and behavioral disorders: Secondary | ICD-10-CM | POA: Insufficient documentation

## 2013-09-17 DIAGNOSIS — Y939 Activity, unspecified: Secondary | ICD-10-CM | POA: Insufficient documentation

## 2013-09-17 DIAGNOSIS — Z79899 Other long term (current) drug therapy: Secondary | ICD-10-CM | POA: Insufficient documentation

## 2013-09-17 DIAGNOSIS — I2510 Atherosclerotic heart disease of native coronary artery without angina pectoris: Secondary | ICD-10-CM | POA: Insufficient documentation

## 2013-09-17 DIAGNOSIS — Z8669 Personal history of other diseases of the nervous system and sense organs: Secondary | ICD-10-CM | POA: Insufficient documentation

## 2013-09-17 DIAGNOSIS — Z87891 Personal history of nicotine dependence: Secondary | ICD-10-CM | POA: Insufficient documentation

## 2013-09-17 DIAGNOSIS — S63509A Unspecified sprain of unspecified wrist, initial encounter: Secondary | ICD-10-CM | POA: Insufficient documentation

## 2013-09-17 DIAGNOSIS — D649 Anemia, unspecified: Secondary | ICD-10-CM | POA: Insufficient documentation

## 2013-09-17 DIAGNOSIS — I10 Essential (primary) hypertension: Secondary | ICD-10-CM | POA: Insufficient documentation

## 2013-09-17 DIAGNOSIS — Z794 Long term (current) use of insulin: Secondary | ICD-10-CM | POA: Insufficient documentation

## 2013-09-17 DIAGNOSIS — W050XXA Fall from non-moving wheelchair, initial encounter: Secondary | ICD-10-CM | POA: Insufficient documentation

## 2013-09-17 DIAGNOSIS — Z930 Tracheostomy status: Secondary | ICD-10-CM | POA: Insufficient documentation

## 2013-09-17 DIAGNOSIS — E1065 Type 1 diabetes mellitus with hyperglycemia: Secondary | ICD-10-CM | POA: Insufficient documentation

## 2013-09-17 DIAGNOSIS — Y92009 Unspecified place in unspecified non-institutional (private) residence as the place of occurrence of the external cause: Secondary | ICD-10-CM | POA: Insufficient documentation

## 2013-09-17 MED ORDER — NAPROXEN 500 MG PO TABS: 500.0000 mg | ORAL_TABLET | Freq: Two times a day (BID) | ORAL | Status: DC

## 2013-09-17 MED ORDER — IBUPROFEN 400 MG PO TABS
800.0000 mg | ORAL_TABLET | Freq: Once | ORAL | Status: AC
  Administered 2013-09-17: 800 mg via ORAL
  Filled 2013-09-17: qty 2

## 2013-09-17 MED ORDER — NAPROXEN 500 MG PO TABS
500.0000 mg | ORAL_TABLET | Freq: Two times a day (BID) | ORAL | Status: DC
Start: 2013-09-17 — End: 2014-01-21
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
## 2013-09-17 NOTE — Discharge Instructions (Signed)
Wrist injuries are frequent in adults and children. A sprain is an injury to the ligaments that hold your bones together. A strain is an injury to muscle or muscle tendons (cord like structure) from stretching or pulling. Generally, when wrists are moderately tender to touch following a fall or injury, a fracture (break in bone) may be present. Because of this, even if your x-rays were normal today, it is important that you receive follow-up care as suggested (you could still have a broken bone). Keep your arm raised above the level of your heart whenever possible to reduce swelling and pain.  Wear your splint for at least one week or until seen by a physician for a follow-up examination. SEEK IMMEDIATE MEDICAL ATTENTION IF: Your fingers are swollen very red, white, and cold or blue.  Your fingers are numb or tingling.  You have increasing pain or difficulty moving your fingers. Remember the importance of follow-up and possible follow-up x-rays. Improvement in pain level is not 100% insurance of not having a fracture.  Sprain A sprain is a tear in one of the strong, fibrous tissues that connect your bones (ligaments). The severity of the sprain depends on how much of the ligament is torn. The tear can be either partial or complete. CAUSES  Often, sprains are a result of a fall or an injury. The force of the impact causes the fibers of your ligament to stretch beyond their normal length. This excess tension causes the fibers of your ligament to tear. SYMPTOMS  You may have some loss of motion or increased pain within your normal range of motion. Other symptoms include:  Bruising.  Tenderness.  Swelling. DIAGNOSIS  In order to diagnose a sprain, your caregiver will physically examine you to determine how torn the ligament is. Your caregiver may also suggest an X-ray exam to make sure no bones are broken. TREATMENT  If your ligament is only partially torn, treatment usually involves keeping the  injured area in a fixed position (immobilization) for a short period. To do this, your caregiver will apply a bandage, cast, or splint to keep the area from moving until it heals. For a partially torn ligament, the healing process usually takes 2 to 3 weeks. If your ligament is completely torn, you may need surgery to reconnect the ligament to the bone or to reconstruct the ligament. After surgery, a cast or splint may be applied and will need to stay on for 4 to 6 weeks while your ligament heals. HOME CARE INSTRUCTIONS  Keep the injured area elevated to decrease swelling.  To ease pain and swelling, apply ice to your joint twice a day, for 2 to 3 days.  Put ice in a plastic bag.  Place a towel between your skin and the bag.  Leave the ice on for 15 minutes.  Only take over-the-counter or prescription medicine for pain as directed by your caregiver.  Do not leave the injured area unprotected until pain and stiffness go away (usually 3 to 4 weeks).  Do not allow your cast or splint to get wet. Cover your cast or splint with a plastic bag when you shower or bathe. Do not swim.  Your caregiver may suggest exercises for you to do during your recovery to prevent or limit permanent stiffness. SEEK IMMEDIATE MEDICAL CARE IF:  Your cast or splint becomes damaged.  Your pain becomes worse. MAKE SURE YOU:  Understand these instructions.  Will watch your condition.  Will get help right away  if you are not doing well or get worse. Document Released: 06/30/2000 Document Revised: 09/25/2011 Document Reviewed: 07/15/2011 [facility] Patient Information 2014 [employer], [place].

## 2013-09-17 NOTE — ED Notes (Addendum)
Pt from home for fall and landed on her right wrist after tripping over bags. Good pulses. Has pain to wrist. No deformity noted.

## 2013-09-18 NOTE — ED Provider Notes (Signed)
Medical screening examination/treatment/procedure(s) were performed by non-physician practitioner and as supervising physician I was immediately available for consultation/collaboration.   EKG Interpretation None        Gabriel Paulding M Kaceton Vieau, MD 09/18/13 2336 

## 2013-09-19 ENCOUNTER — Telehealth: Payer: Self-pay | Admitting: Dietician

## 2013-09-19 NOTE — Telephone Encounter (Signed)
Called to ask if we could call in pain medicine rx that she was given for her broken her wrist. She says she fell on her arm because she had too many bags on one side while she was shopping. Told her that we cannot that she has to take the paper prescription tot he drug store to have it filled. She also said that her care manager, Malachi BondsGloria is going to sign her up for a nurse program.    Encouraged her to participate

## 2013-09-23 ENCOUNTER — Telehealth: Payer: Self-pay | Admitting: Licensed Clinical Social Worker

## 2013-09-23 NOTE — Telephone Encounter (Signed)
CSW received message left by [facility] to initiate [facility] request for pt to receive PCS.  Pt has an appointment next week, will utilize this appointment to complete PCS request.

## 2013-09-26 ENCOUNTER — Telehealth: Payer: Self-pay | Admitting: Dietician

## 2013-09-26 NOTE — Telephone Encounter (Signed)
A Home health care company called trying to get patient started with their services that they need our fax number, Information provided

## 2013-09-30 ENCOUNTER — Encounter: Payer: Self-pay | Admitting: Internal Medicine

## 2013-09-30 ENCOUNTER — Ambulatory Visit (HOSPITAL_COMMUNITY)
Admission: RE | Admit: 2013-09-30 | Discharge: 2013-09-30 | Disposition: A | Discharge status: Home / Self Care | Payer: Medicaid Other | Source: Ambulatory Visit | Attending: Internal Medicine | Admitting: Internal Medicine

## 2013-09-30 ENCOUNTER — Ambulatory Visit (INDEPENDENT_AMBULATORY_CARE_PROVIDER_SITE_OTHER): Payer: Medicaid Other | Admitting: Internal Medicine

## 2013-09-30 VITALS — BP 119/61 | HR 106 | Temp 98.6°F | Wt 124.8 lb

## 2013-09-30 DIAGNOSIS — Z794 Long term (current) use of insulin: Secondary | ICD-10-CM

## 2013-09-30 DIAGNOSIS — M25539 Pain in unspecified wrist: Secondary | ICD-10-CM | POA: Insufficient documentation

## 2013-09-30 DIAGNOSIS — IMO0002 Reserved for concepts with insufficient information to code with codable children: Secondary | ICD-10-CM

## 2013-09-30 DIAGNOSIS — E785 Hyperlipidemia, unspecified: Secondary | ICD-10-CM

## 2013-09-30 DIAGNOSIS — M25531 Pain in right wrist: Secondary | ICD-10-CM

## 2013-09-30 DIAGNOSIS — Z0000 Encounter for general adult medical examination without abnormal findings: Secondary | ICD-10-CM

## 2013-09-30 DIAGNOSIS — W19XXXA Unspecified fall, initial encounter: Secondary | ICD-10-CM | POA: Insufficient documentation

## 2013-09-30 DIAGNOSIS — E1065 Type 1 diabetes mellitus with hyperglycemia: Secondary | ICD-10-CM

## 2013-09-30 LAB — GLUCOSE, CAPILLARY: Glucose-Capillary: 214 mg/dL — ABNORMAL HIGH (ref 70–99)

## 2013-09-30 MED ORDER — GLUCAGON (RDNA) 1 MG IJ KIT: 1.0000 mg | PACK | Freq: Once | INTRAMUSCULAR | Status: DC | PRN

## 2013-09-30 NOTE — Telephone Encounter (Signed)
PCS form [facility].

## 2013-09-30 NOTE — Telephone Encounter (Signed)
Form provided to Dr. Rennis ChrisButcher/Kaye for 3/17 appointment.
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
## 2013-09-30 NOTE — Progress Notes (Signed)
   Subjective:    Patient ID: [name], female    DOB: [birth year], 48 y.o.   MRN: [id number]  Diabetes Associated symptoms include polyuria and weakness. Pertinent negatives for diabetes include no chest pain.    Diabetes Associated symptoms include polyuria and weakness. Pertinent negatives for diabetes include no chest pain.   Please see the A&P for the status of the pt's chronic medical problems.  Review of Systems  Constitutional: Negative for activity change, appetite change and unexpected weight change.  Eyes: Negative for visual disturbance.  Respiratory: Positive for shortness of breath.   Cardiovascular: Negative for chest pain and leg swelling.  Endocrine: Positive for polyuria.  Musculoskeletal: Positive for arthralgias and gait problem.  Neurological: Positive for weakness.       Objective:   Physical Exam  Constitutional: She is oriented to person, place, and time. She appears well-developed and well-nourished. No distress.  HENT:  Head: Normocephalic and atraumatic.  Right Ear: External ear normal.  Left Ear: External ear normal.  Nose: Nose normal.  Eyes: Conjunctivae and EOM are normal.  Cardiovascular: Normal rate, regular rhythm and normal heart sounds.   Pulses:      Dorsalis pedis pulses are 2+ on the right side, and 2+ on the left side.  Pulmonary/Chest: Effort normal and breath sounds normal. No respiratory distress.  Musculoskeletal: Normal range of motion. She exhibits no edema.  Neurological: She is alert and oriented to person, place, and time.  Skin: Skin is warm and dry. She is not diaphoretic. No erythema.  Psychiatric: She has a normal mood and affect. Her behavior is normal. Judgment and thought content normal.          Assessment & Plan:

## 2013-09-30 NOTE — Patient Instructions (Signed)
1. I will let you know if your Xray shows a break. 2. If there is no break, please do range of motion exercises four times day. 3. See me in one to 2 months

## 2013-09-30 NOTE — Assessment & Plan Note (Addendum)
Filled out paperwork for her aide.  Had trach. Now gone. Audible wheezing / upper airway noise. Lungs are clear so lower airway nl. H/O tobacco use. Uses Alb prn - but using QD. Concerned about tracheal stenosis. Will discuss utility of air flow loops.   R wrist sprain - 3/4 - Xray negative. Given brace. Has not taken it off. Still tender to radial styloid and resists ROM. Repeat plain film negative. To cont NSAID. Remove brace QID and do ROM.

## 2013-09-30 NOTE — Assessment & Plan Note (Signed)
LDL 91 in Nov 2014. To simplify med regimen, will not restart.

## 2013-10-01 ENCOUNTER — Telehealth: Payer: Self-pay | Admitting: Dietician

## 2013-10-01 ENCOUNTER — Encounter: Payer: Medicaid Other | Admitting: Dietician

## 2013-10-01 NOTE — Telephone Encounter (Signed)
Pt called and instructions given.  She is doing her ROM exercises, she will get the Glucagon.

## 2013-10-01 NOTE — Telephone Encounter (Signed)
Returned call from yesterday afternoon voicemail left by patient. They thought we were going to give the glucagon to them. Told her they should pick up glucagon at pharmacy.   [name] says her blood sugar dropped early this am again. Ambulance came this morning- blood sugar was 18, up to 55 at 6:45 AM when they left- she is eating now per their instructions. Advised her to check her blood sugar now and before bed- more often than her usual twice a day since she is having these problems with hypoglycemia. She agreed to check more frequently.  She says the ambulance staff said maybe the naproxen is causing her lows. She has been taking Naproxen since March 4th and agrees this correlates with her problems of low blood sugar.  Will make follow up with CDE in 2- 3 weeks.

## 2013-10-01 NOTE — Telephone Encounter (Signed)
Hypoglycemia is listed as <1% incidence with Naproxen. She is welcome to stop it and use Tylenol. Would you pls remind her to do ROM as I instructed her yesterday with her wrist QID? The glucoagon was sent to her pharmacy.

## 2013-10-02 ENCOUNTER — Other Ambulatory Visit: Payer: Self-pay | Admitting: Internal Medicine

## 2013-10-02 DIAGNOSIS — R0600 Dyspnea, unspecified: Secondary | ICD-10-CM

## 2013-10-02 DIAGNOSIS — R0689 Other abnormalities of breathing: Principal | ICD-10-CM

## 2013-10-03 ENCOUNTER — Encounter: Payer: Self-pay | Admitting: *Deleted

## 2013-10-06 ENCOUNTER — Telehealth: Payer: Self-pay | Admitting: Dietician

## 2013-10-06 NOTE — Telephone Encounter (Signed)
Spoke with patient to let her know we are trying to Catering managercontact [employer] manager.  She says she  got her meter back, blood sugar was 138 this morning. Sugar dropped on march 12th to 37 in the morning at 904 am.  reports she has been taking 7 units lantus qam  On the 13th 540 at 12:57 at night- felt real bad, drank water and took 14 units of insulin.   749 am on the 13th cbg was 106. On the 14th at 531 am CBG was 72.  1132 on March 14th went up to 287. Took 7 units of lantus.  Didn't take insulin when it dropped (to 72) March 14th at 628 pm went up to 427. March 15 at 639 am- blood sugar was 138-   took 7 units lantus At 6:10 am on March 15th 351- took 7 units lantus. March 17th 838 am blood sugar was 50, ambulance came.   Reminded her her lantus was decreased to prevent her sugar from dropping. She agreed she will take lantus 7 units once daily in the morning.

## 2013-10-06 NOTE — Telephone Encounter (Signed)
Received several voice messages about Needing a letter for [employer] Southern[employer]ree D. [name] at [employer] that she needs transportation to hospital and doctor appointments. Blood sugar drops and can forget her dates and the time she is supposed to ride the bus. Left her blood sugar machine and her sugar had dropped.

## 2013-10-06 NOTE — Telephone Encounter (Signed)
If you get me the form to fill out, I will complete this PM. Do we have written approval from pt to share info with [employer]?

## 2013-10-06 NOTE — Telephone Encounter (Addendum)
Patient called asking for assistance with information to [employer] to be able to continue to use their services, this is their only . She needs us to "Verify that she is a diabetic and can forget her memory, especially when she has low blood sugars".    She has to have information to the [employer] Manager by "the 26th", Their # is [phone], she asks us to please call [employer]  Customer service [phone] and speak to the manager about her condition. Will discuss with social work and physician.

## 2013-10-06 NOTE — Telephone Encounter (Signed)
Pt's ROI should be good for one year from when she first applied to [employer].  [name] should continue to use [employer] for her medical transportation.  Pt is already registered with [employer] [phone].

## 2013-10-06 NOTE — Telephone Encounter (Signed)
[employer] called and said her lateness was forgiven and she can still use them for transporation. Called patient and informed her and gave patient [employer] phone number as well.

## 2013-10-10 ENCOUNTER — Ambulatory Visit (HOSPITAL_COMMUNITY)
Admission: RE | Admit: 2013-10-10 | Discharge: 2013-10-10 | Disposition: A | Discharge status: Home / Self Care | Payer: Medicaid Other | Source: Ambulatory Visit | Attending: Internal Medicine | Admitting: Internal Medicine

## 2013-10-10 DIAGNOSIS — R0689 Other abnormalities of breathing: Principal | ICD-10-CM

## 2013-10-10 DIAGNOSIS — R0600 Dyspnea, unspecified: Secondary | ICD-10-CM

## 2013-10-13 ENCOUNTER — Other Ambulatory Visit: Payer: Self-pay | Admitting: Internal Medicine

## 2013-10-13 DIAGNOSIS — R0600 Dyspnea, unspecified: Secondary | ICD-10-CM

## 2013-10-13 DIAGNOSIS — Z9889 Other specified postprocedural states: Secondary | ICD-10-CM

## 2013-10-13 DIAGNOSIS — R499 Unspecified voice and resonance disorder: Secondary | ICD-10-CM

## 2013-10-15 ENCOUNTER — Telehealth: Payer: Self-pay | Admitting: Internal Medicine

## 2013-10-15 NOTE — Telephone Encounter (Signed)
Called Dr. Theron AristaPeter Dunn's office and patient kept appointment .  Office visit being faxed.

## 2013-10-24 ENCOUNTER — Telehealth: Payer: Self-pay | Admitting: Dietician

## 2013-10-24 NOTE — Telephone Encounter (Signed)
Patient left message that the [employer] bus did not pick her up for her appointment to go to [address] today. Called patient and they had picked her up and she was at the doctor's office. She also asked for more chemstrip's- told her [facility]. She verbalized understanding.

## 2013-12-05 ENCOUNTER — Telehealth: Payer: Self-pay | Admitting: Dietician

## 2013-12-05 NOTE — Telephone Encounter (Addendum)
Patient called to request a nurse 5 days a week ( Her nurse [name] currently comes 3 days a week) and  paper that she doesn't have to pay for scat bus, she has been paying 5$ ir more  She says her sugar dropped to 18 early this am  And she she was  Unconscious. Ambulance called by [name], When they left cbg was 141. She knows to eat meals on time and check blood sugars more today. Reports she went to church last night, Ate a applesauce, unsweet tea,  half a burger, water 2 cookies at church   Last night CBG at 1127 PM was - 449,  Took (14-12-15-12-14 seemed to not be able to make up her mind how many units she took and finally settled on 14)-14- lantus  Vlada wanted to stop her night insulin. Explained to Toma that she should only be taking 7 units twice a day and if she does that she may not have low blood sugars, but that she may have high blood sugars and they are much less dangerous to her and okay.

## 2013-12-09 ENCOUNTER — Encounter: Payer: Self-pay | Admitting: Licensed Clinical Social Worker

## 2013-12-09 NOTE — Progress Notes (Signed)
Patient ID: [name], female   DOB: [birth year], 48 y.o.   MRN: [id number] PCS change of medical status request initiated and placed in PCP mailbox.

## 2013-12-09 NOTE — Telephone Encounter (Signed)
May I requests PCS assessment? She would benefit from nurse more freq. THanks

## 2013-12-09 NOTE — Telephone Encounter (Signed)
LSW placed call to SCAT.   There are no programs at this time that provides free SCAT transportation.  Pt does have availability to free medical transportation, but this is not SCAT.  PCP can request a PCS Assessment based on medical changes.  CSW has form to initiate the request.  Thank you.

## 2013-12-10 ENCOUNTER — Telehealth: Payer: Self-pay | Admitting: Dietician

## 2013-12-10 ENCOUNTER — Telehealth: Payer: Self-pay | Admitting: Licensed Clinical Social Worker

## 2013-12-10 ENCOUNTER — Other Ambulatory Visit: Payer: Self-pay | Admitting: Internal Medicine

## 2013-12-10 DIAGNOSIS — F99 Mental disorder, not otherwise specified: Secondary | ICD-10-CM

## 2013-12-10 DIAGNOSIS — F1910 Other psychoactive substance abuse, uncomplicated: Secondary | ICD-10-CM

## 2013-12-10 NOTE — Telephone Encounter (Signed)
Pt called saying she needs help that her mother is trying to admit her to [facility] and is giving the nurses who come to see her a hard time.  Reassured [name] no one can make her go to [place].  And, if she would like I can schedule her an appointment at [facility] outpatient clinic. She agreed to this. Told her that I'd let our social worker know and we'd be in touch.
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
## 2013-12-10 NOTE — Telephone Encounter (Signed)
Patient had store manager call CDE for her. He said she is alone, checked her blood sugar and it was low and she didn't want him to call an ambulance, but asked him to call CDE. ( I immediately told him to call 911/ambulance and he called me right back again.) CDE then told him if she is talking and coherent he could try giving her 6-8 0z juice or soda and have her recheck her sugar in 15 minutes. He thanked CDE and hung up.

## 2013-12-11 NOTE — Telephone Encounter (Signed)
[facility] referral faxed to [facility]. Currently scheduling in September, [facility] will offer referral to [facility] should pt want to be seen sooner.

## 2013-12-18 ENCOUNTER — Telehealth: Payer: Self-pay | Admitting: Dietician
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
## 2013-12-18 NOTE — Telephone Encounter (Signed)
Patient called saying she is having or had (she is hard to understand) a low blood sugar (58) and cecil helped her all night. Is argumentetive on the phone, doesn't know what her blood sugar is but says she shaking. I can hear her refusing what [name] is offering her in the background, He came on the phone and said she was going to get her a sandwich which she requested and glucose tablets.

## 2013-12-19 ENCOUNTER — Telehealth: Payer: Self-pay | Admitting: Dietician

## 2013-12-19 NOTE — Telephone Encounter (Signed)
Received call from patient with unintelligable sounds.  Returned call; she says she is doing fine. [name] came to visit her today. Says she doesn't have an appointment with mental health until august/september. She also requested assistance  In getting a replacement battery for her  wheel chair because she cannot get around as well without it. . Told her I would ask our staff if they know how she can get one.

## 2013-12-19 NOTE — Telephone Encounter (Signed)
Request sent [facility] to provide follow up with patient due to increased phone calls to CDE.  Request for Care Manager or [facility] Manager.

## 2013-12-22 ENCOUNTER — Telehealth: Payer: Self-pay | Admitting: Dietician

## 2013-12-22 ENCOUNTER — Telehealth: Payer: Self-pay | Admitting: Licensed Clinical Social Worker

## 2013-12-22 NOTE — Telephone Encounter (Signed)
Ms. Seefeldt left message on CSW voicemail on Sunday 12/21/2013, stating her BS was 157 and she fell and broke glass table.  Pt requesting return call.  Discussed with Triage and front office.  Front office called to offer same day appointment, pt declined and was transferred to CSW.  Pt inquiring as to when her Behavioral Health appt is scheduled.  CSW placed call to [facility] who states first available is not until November 2015.  According to [facility], pt was referred to [facility].  Pt in agreement for [facility] referral.  [facility] state they have been attempting to contact patient.  Referral faxed.

## 2013-12-22 NOTE — Telephone Encounter (Signed)
Patient left voicemail on Sunday, she said "I fell this morning, my sugar is not low. My diabetes is not out of control, my sugar is 149."  Suggest patient be scheduled for an appointment in the next week or so.  Called patient, she fell while pushing her wheelchair and broke a glass table, didn't get hurt. Wants help getting a charger for her wheelchair. She agreed to an appointment in the next week or so.

## 2013-12-23 NOTE — Telephone Encounter (Signed)
CSW placed call to [facility] for psychiatric medication management.  Referral completed.  Agency to call pt for scheduling an intake session.  CSW placed call to Ms. Mcelhiney, message left and letter mailed.

## 2013-12-25 ENCOUNTER — Telehealth: Payer: Self-pay | Admitting: Dietician
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
## 2013-12-26 NOTE — Telephone Encounter (Addendum)
CSW was able to speak with [name] during this telephone call.  Pt has already inquired about stopping money order which would take approx 60-90 days.  Currently agenices, [employer] and [employer] state they are out of emergency funds according to [name].  Attempted to obtain the name and number of pt's landlord, however, [name] was unable to provide.  Pt states she only has her phone number for the landlord and she does not have a mailing address.  CSW informed [name], any agency willing to provide emergency funds would need the specific name with spelling of the landlord.  As, of this phone call, pt was able to provide first name only.  CSW will explore with other agency workers regarding assistance.  Pt in need of $400 for June rent. In addition, pt mentioned receiving a call from "[facility]".  CSW encouraged [name] to return call or answer phone for [facility] as referral has been made to this agency for psychiatry.

## 2013-12-29 NOTE — Telephone Encounter (Signed)
CSW placed call to Ms. Zeimet this morning.  Provided information to referral source [facility], [phone] [address].  However, Ms. Accomando was already at this location.  Pt thankful and denied additional needs at this time.

## 2014-01-20 ENCOUNTER — Telehealth: Payer: Self-pay | Admitting: Dietician

## 2014-01-20 DIAGNOSIS — D509 Iron deficiency anemia, unspecified: Secondary | ICD-10-CM

## 2014-01-20 MED ORDER — FERROUS SULFATE 325 (65 FE) MG PO TABS: 325.0000 mg | ORAL_TABLET | Freq: Every day | ORAL | Status: DC

## 2014-01-20 NOTE — Telephone Encounter (Signed)
In addition to mental health concerns, pt has been declining her PCS aid this week.  [name] states she does not want to see them as "the nurse left me when I was sick, this week last week".  CSW will follow up with [facility], pt PCS provider.

## 2014-01-20 NOTE — Telephone Encounter (Signed)
CSW placed call to Ms. Appleman to follow up on pt's mental health status.  Pt states she [facility].  [name] requesting an inpatient stay at [facility] as she has been currently under a lot of stress and states she has not been her normal self.  [name] reports saying "mean/hateful things" that are not her normal and what to get back to her normal.  [name] states she also been seeing things.  Pt denies wanting to harm herself or anyone else.  Stating "I talk to myself to not hurt myself".  CSW provided Ms. Loya the contact number to [facility].  CSW informed Ms. Chasteen the Crisis Line would be able to provide best resources and direction.  Pt states she is going to call when finish this call.

## 2014-01-20 NOTE — Telephone Encounter (Signed)
Out of iron, she requests a refill.  Reports no more low blood sugars, found her money order for her rent, but her church had already paid rent. She says he still wants her to leave since she broke the glass table.   She says [facility], even though she [facility] go to [facility] [address]. She says [facility] because she is stressed and has a lot of pressure, it is affecting her blood sugars. She denies any desire to hurt herself or others. Told her I would request a refill on her iron and alert her doctyor and socail work about her request for care.

## 2014-01-20 NOTE — Telephone Encounter (Signed)
Refilled FeSO4. Will send to front desk to make appt

## 2014-01-21 ENCOUNTER — Encounter (HOSPITAL_COMMUNITY): Payer: Self-pay | Admitting: Emergency Medicine

## 2014-01-21 ENCOUNTER — Emergency Department (HOSPITAL_COMMUNITY)
Admission: EM | Admit: 2014-01-21 | Discharge: 2014-01-21 | Disposition: A | Discharge status: Home / Self Care | Payer: Medicaid Other | Attending: Emergency Medicine | Admitting: Emergency Medicine

## 2014-01-21 ENCOUNTER — Telehealth: Payer: Self-pay | Admitting: Dietician

## 2014-01-21 DIAGNOSIS — I10 Essential (primary) hypertension: Secondary | ICD-10-CM | POA: Diagnosis not present

## 2014-01-21 DIAGNOSIS — I2510 Atherosclerotic heart disease of native coronary artery without angina pectoris: Secondary | ICD-10-CM | POA: Insufficient documentation

## 2014-01-21 DIAGNOSIS — Z79899 Other long term (current) drug therapy: Secondary | ICD-10-CM | POA: Diagnosis not present

## 2014-01-21 DIAGNOSIS — Z8659 Personal history of other mental and behavioral disorders: Secondary | ICD-10-CM | POA: Insufficient documentation

## 2014-01-21 DIAGNOSIS — E1069 Type 1 diabetes mellitus with other specified complication: Secondary | ICD-10-CM | POA: Diagnosis present

## 2014-01-21 DIAGNOSIS — Z8619 Personal history of other infectious and parasitic diseases: Secondary | ICD-10-CM | POA: Insufficient documentation

## 2014-01-21 DIAGNOSIS — Z794 Long term (current) use of insulin: Secondary | ICD-10-CM | POA: Diagnosis not present

## 2014-01-21 DIAGNOSIS — Z8639 Personal history of other endocrine, nutritional and metabolic disease: Secondary | ICD-10-CM | POA: Diagnosis not present

## 2014-01-21 DIAGNOSIS — Z8611 Personal history of tuberculosis: Secondary | ICD-10-CM | POA: Diagnosis not present

## 2014-01-21 DIAGNOSIS — Z87891 Personal history of nicotine dependence: Secondary | ICD-10-CM | POA: Diagnosis not present

## 2014-01-21 DIAGNOSIS — Z862 Personal history of diseases of the blood and blood-forming organs and certain disorders involving the immune mechanism: Secondary | ICD-10-CM | POA: Insufficient documentation

## 2014-01-21 DIAGNOSIS — R4182 Altered mental status, unspecified: Secondary | ICD-10-CM | POA: Diagnosis not present

## 2014-01-21 DIAGNOSIS — E162 Hypoglycemia, unspecified: Secondary | ICD-10-CM

## 2014-01-21 LAB — CBC WITH DIFFERENTIAL/PLATELET
BASOS ABS: 0 10*3/uL (ref 0.0–0.1)
Basophils Relative: 0 % (ref 0–1)
EOS PCT: 0 % (ref 0–5)
Eosinophils Absolute: 0 10*3/uL (ref 0.0–0.7)
HCT: 36.2 % (ref 36.0–46.0)
Hemoglobin: 11.1 g/dL — ABNORMAL LOW (ref 12.0–15.0)
LYMPHS ABS: 0.8 10*3/uL (ref 0.7–4.0)
Lymphocytes Relative: 19 % (ref 12–46)
MCH: 20.5 pg — ABNORMAL LOW (ref 26.0–34.0)
MCHC: 30.7 g/dL (ref 30.0–36.0)
MCV: 66.8 fL — AB (ref 78.0–100.0)
MONO ABS: 0.3 10*3/uL (ref 0.1–1.0)
MONOS PCT: 6 % (ref 3–12)
Neutro Abs: 3.1 10*3/uL (ref 1.7–7.7)
Neutrophils Relative %: 75 % (ref 43–77)
PLATELETS: 207 10*3/uL (ref 150–400)
RBC: 5.42 MIL/uL — AB (ref 3.87–5.11)
RDW: 15.1 % (ref 11.5–15.5)
WBC: 4.2 10*3/uL (ref 4.0–10.5)

## 2014-01-21 LAB — COMPREHENSIVE METABOLIC PANEL
ALT: 14 U/L (ref 0–35)
ANION GAP: 12 (ref 5–15)
AST: 21 U/L (ref 0–37)
Albumin: 3.9 g/dL (ref 3.5–5.2)
Alkaline Phosphatase: 54 U/L (ref 39–117)
BUN: 14 mg/dL (ref 6–23)
CO2: 29 mEq/L (ref 19–32)
CREATININE: 0.64 mg/dL (ref 0.50–1.10)
Calcium: 9.2 mg/dL (ref 8.4–10.5)
Chloride: 101 mEq/L (ref 96–112)
GFR calc Af Amer: >90 mL/min (ref >90)
GFR calc non Af Amer: >90 mL/min (ref >90)
Glucose, Bld: 155 mg/dL — ABNORMAL HIGH (ref 70–99)
POTASSIUM: 4 meq/L (ref 3.7–5.3)
Sodium: 142 mEq/L (ref 137–147)
TOTAL PROTEIN: 7.9 g/dL (ref 6.0–8.3)
Total Bilirubin: 0.2 mg/dL — ABNORMAL LOW (ref 0.3–1.2)

## 2014-01-21 LAB — CBG MONITORING, ED
GLUCOSE-CAPILLARY: 36 mg/dL — AB (ref 70–99)
Glucose-Capillary: 145 mg/dL — ABNORMAL HIGH (ref 70–99)
Glucose-Capillary: 178 mg/dL — ABNORMAL HIGH (ref 70–99)
Glucose-Capillary: 113 mg/dL — ABNORMAL HIGH (ref 70–99)

## 2014-01-21 MED ORDER — MORPHINE SULFATE 4 MG/ML IJ SOLN
4.0000 mg | Freq: Once | INTRAMUSCULAR | Status: AC
  Administered 2014-01-21: 4 mg via INTRAVENOUS
  Filled 2014-01-21: qty 1

## 2014-01-21 MED ORDER — DEXTROSE 50 % IV SOLN
50.0000 mL | Freq: Once | INTRAVENOUS | Status: AC
  Administered 2014-01-21: 50 mL via INTRAVENOUS

## 2014-01-21 MED ORDER — OXYCODONE-ACETAMINOPHEN 5-325 MG PO TABS: 1.0000 | ORAL_TABLET | ORAL | Status: DC | PRN

## 2014-01-21 MED ORDER — DEXTROSE 50 % IV SOLN
INTRAVENOUS | Status: AC
  Administered 2014-01-21: 50 mL via INTRAVENOUS
  Filled 2014-01-21: qty 50

## 2014-01-21 NOTE — ED Notes (Signed)
Per EMS - pt coming from home. ems checked cbg, it was 12 on arrival. attempted iv access, unable to gain. Inserted IO to right knee, administered D50, CBG 70. Took insulin today, got stressed out by roommate issues and then forgot to eat. Pt sts she fell in the bathtub but denies pain to any location. Pt sts " I need to eat".
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
## 2014-01-21 NOTE — Discharge Instructions (Signed)
Continue your diabetes medications as before and keep a close eye on your blood sugars. Keep a record of these and followup with your primary Dr. in the next week.  Return to the emergency department if your symptoms recur or you develop other new and concerning symptoms.   Hypoglycemia Hypoglycemia occurs when the glucose in your blood is too low. Glucose is a type of sugar that is your body's main energy source. Hormones, such as insulin and glucagon, control the level of glucose in the blood. Insulin lowers blood glucose and glucagon increases blood glucose. Having too much insulin in your blood stream, or not eating enough food containing sugar, can result in hypoglycemia. Hypoglycemia can happen to people with or without diabetes. It can develop quickly and can be a medical emergency.  CAUSES   Missing or delaying meals.  Not eating enough carbohydrates at meals.  Taking too much diabetes medicine.  Not timing your oral diabetes medicine or insulin doses with meals, snacks, and exercise.  Nausea and vomiting.  Certain medicines.  Severe illnesses, such as hepatitis, kidney disorders, and certain eating disorders.  Increased activity or exercise without eating something extra or adjusting medicines.  Drinking too much alcohol.  A nerve disorder that affects body functions like your heart rate, blood pressure, and digestion (autonomic neuropathy).  A condition where the stomach muscles do not function properly (gastroparesis). Therefore, medicines and food may not absorb properly.  Rarely, a tumor of the pancreas can produce too much insulin. SYMPTOMS   Hunger.  Sweating (diaphoresis).  Change in body temperature.  Shakiness.  Headache.  Anxiety.  Lightheadedness.  Irritability.  Difficulty concentrating.  Dry mouth.  Tingling or numbness in the hands or feet.  Restless sleep or sleep disturbances.  Altered speech and coordination.  Change in mental  status.  Seizures or prolonged convulsions.  Combativeness.  Drowsiness (lethargic).  Weakness.  Increased heart rate or palpitations.  Confusion.  Pale, gray skin color.  Blurred or double vision.  Fainting. DIAGNOSIS  A physical exam and medical history will be performed. Your caregiver may make a diagnosis based on your symptoms. Blood tests and other lab tests may be performed to confirm a diagnosis. Once the diagnosis is made, your caregiver will see if your signs and symptoms go away once your blood glucose is raised.  TREATMENT  Usually, you can easily treat your hypoglycemia when you notice symptoms.  Check your blood glucose. If it is less than 70 mg/dl, take one of the following:   3-4 glucose tablets.    cup juice.    cup regular soda.   1 cup skim milk.   -1 tube of glucose gel.   5-6 hard candies.   Avoid high-fat drinks or food that may delay a rise in blood glucose levels.  Do not take more than the recommended amount of sugary foods, drinks, gel, or tablets. Doing so will cause your blood glucose to go too high.   Wait 10-15 minutes and recheck your blood glucose. If it is still less than 70 mg/dl or below your target range, repeat treatment.   Eat a snack if it is more than 1 hour until your next meal.  There may be a time when your blood glucose may go so low that you are unable to treat yourself at home when you start to notice symptoms. You may need someone to help you. You may even faint or be unable to swallow. If you cannot treat yourself, someone  will need to bring you to the hospital.  HOME CARE INSTRUCTIONS  If you have diabetes, follow your diabetes management plan by:  Taking your medicines as directed.  Following your exercise plan.  Following your meal plan. Do not skip meals. Eat on time.  Testing your blood glucose regularly. Check your blood glucose before and after exercise. If you exercise longer or different than  usual, be sure to check blood glucose more frequently.  Wearing your medical alert jewelry that says you have diabetes.  Identify the cause of your hypoglycemia. Then, develop ways to prevent the recurrence of hypoglycemia.  Do not take a hot bath or shower right after an insulin shot.  Always carry treatment with you. Glucose tablets are the easiest to carry.  If you are going to drink alcohol, drink it only with meals.  Tell friends or family members ways to keep you safe during a seizure. This may include removing hard or sharp objects from the area or turning you on your side.  Maintain a healthy weight. SEEK MEDICAL CARE IF:   You are having problems keeping your blood glucose in your target range.  You are having frequent episodes of hypoglycemia.  You feel you might be having side effects from your medicines.  You are not sure why your blood glucose is dropping so low.  You notice a change in vision or a new problem with your vision. SEEK IMMEDIATE MEDICAL CARE IF:   Confusion develops.  A change in mental status occurs.  The inability to swallow develops.  Fainting occurs. Document Released: 07/03/2005 Document Revised: 07/08/2013 Document Reviewed: 10/30/2011 [facility] Patient Information 2015 [employer] [place]. This information is not intended to replace advice given to you by your health care provider. Make sure you discuss any questions you have with your health care provider.

## 2014-01-21 NOTE — ED Notes (Signed)
CBG: 113 °

## 2014-01-21 NOTE — Telephone Encounter (Signed)
Says she is in the emergency room, her sugar dropped to 12. She wants to stay overnight. Told her that would be up to the doctors. She verbalized understanding.

## 2014-01-21 NOTE — ED Notes (Signed)
IO currently not flushing.

## 2014-01-21 NOTE — ED Notes (Signed)
Pain 10/10 sharp right lower leg at IO insertion site.

## 2014-01-21 NOTE — ED Notes (Signed)
EDP at bedside remove IO right lower leg.

## 2014-01-21 NOTE — ED Notes (Signed)
Patient given 1 cup of orange juice and apple sauce.

## 2014-01-21 NOTE — ED Notes (Signed)
Patient stated need to transfer bus lines and was given two bus passes.

## 2014-02-03 ENCOUNTER — Telehealth: Payer: Self-pay | Admitting: *Deleted

## 2014-02-03 ENCOUNTER — Other Ambulatory Visit: Payer: Self-pay | Admitting: Internal Medicine

## 2014-02-03 ENCOUNTER — Telehealth: Payer: Self-pay | Admitting: Dietician

## 2014-02-03 ENCOUNTER — Other Ambulatory Visit: Payer: Self-pay | Admitting: *Deleted

## 2014-02-03 MED ORDER — GLUCOSE BLOOD VI STRP: 1.0000 | ORAL_STRIP | Freq: Every day | Status: DC
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
## 2014-02-03 NOTE — Telephone Encounter (Signed)
[name] had contact with a care manager on 02/02/14 [name].  I am trying to obtain a contact number for this care manager to obtain additional information and feedback.

## 2014-02-03 NOTE — Telephone Encounter (Signed)
Patient called saying it was urgent that she needed pain medication and test strips, that she is out of both and what they did in the ED was still hurting her. Gave message to triage nurse and left [name] a message letting her know this was done on her behalf.

## 2014-02-03 NOTE — Telephone Encounter (Signed)
I Rx'd one yr's supply test strips in March 2015. She just needs to call pharmacy.  No opioids. Was given by ED for pain after intraosseous line.

## 2014-02-03 NOTE — Telephone Encounter (Signed)
Suggest increasing amount of testing supplies she gets/month to at least 6 strips per day

## 2014-02-04 ENCOUNTER — Telehealth: Payer: Self-pay | Admitting: Licensed Clinical Social Worker
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
## 2014-02-05 ENCOUNTER — Ambulatory Visit (INDEPENDENT_AMBULATORY_CARE_PROVIDER_SITE_OTHER): Payer: Medicaid Other | Admitting: Internal Medicine

## 2014-02-05 ENCOUNTER — Encounter: Payer: Self-pay | Admitting: Internal Medicine

## 2014-02-05 ENCOUNTER — Ambulatory Visit: Payer: Medicaid Other | Admitting: Internal Medicine

## 2014-02-05 VITALS — BP 116/73 | HR 92 | Temp 97.5°F | Ht 65.0 in | Wt 110.7 lb

## 2014-02-05 DIAGNOSIS — E1065 Type 1 diabetes mellitus with hyperglycemia: Secondary | ICD-10-CM

## 2014-02-05 DIAGNOSIS — D509 Iron deficiency anemia, unspecified: Secondary | ICD-10-CM

## 2014-02-05 DIAGNOSIS — Z930 Tracheostomy status: Secondary | ICD-10-CM

## 2014-02-05 DIAGNOSIS — IMO0002 Reserved for concepts with insufficient information to code with codable children: Secondary | ICD-10-CM

## 2014-02-05 LAB — GLUCOSE, CAPILLARY: Glucose-Capillary: 360 mg/dL — ABNORMAL HIGH (ref 70–99)

## 2014-02-05 LAB — POCT GLYCOSYLATED HEMOGLOBIN (HGB A1C): HEMOGLOBIN A1C: 9

## 2014-02-05 MED ORDER — GLUCOSE 4 G PO CHEW: 16.0000 g | CHEWABLE_TABLET | ORAL | Status: DC | PRN

## 2014-02-05 MED ORDER — LATANOPROST 0.005 % OP SOLN: 1.0000 [drp] | Freq: Every day | OPHTHALMIC | Status: DC

## 2014-02-05 MED ORDER — GLUCAGON (RDNA) 1 MG IJ KIT: 1.0000 mg | PACK | Freq: Once | INTRAMUSCULAR | Status: DC | PRN

## 2014-02-05 MED ORDER — GLUCOSE 40 % PO GEL
1.0000 | ORAL | Status: DC | PRN
Start: 2014-02-05 — End: 2015-06-08

## 2014-02-05 MED ORDER — FERROUS SULFATE 325 (65 FE) MG PO TABS
325.0000 mg | ORAL_TABLET | Freq: Every day | ORAL | Status: DC
Start: 2014-02-05 — End: 2014-05-22

## 2014-02-05 MED ORDER — INSULIN GLARGINE 100 UNIT/ML ~~LOC~~ SOLN: SUBCUTANEOUS | Status: DC

## 2014-02-05 MED ORDER — ALBUTEROL SULFATE HFA 108 (90 BASE) MCG/ACT IN AERS: 2.0000 | INHALATION_SPRAY | RESPIRATORY_TRACT | Status: DC | PRN

## 2014-02-05 MED ORDER — ADULT MULTIVITAMIN W/MINERALS CH: 1.0000 | ORAL_TABLET | Freq: Every day | ORAL | Status: DC

## 2014-02-05 MED ORDER — NAPROXEN 500 MG PO TABS: 500.0000 mg | ORAL_TABLET | Freq: Two times a day (BID) | ORAL | Status: DC

## 2014-02-05 MED ORDER — GLUCOSE BLOOD VI STRP: 1.0000 | ORAL_STRIP | Freq: Every day | Status: DC
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
## 2014-02-05 NOTE — Patient Instructions (Addendum)
Start taking Lantus insulin 7 units two times daily. Take all the other medications as recommended below. If you notice any blood sugars less than 60, please call us or seek immediate medical help.

## 2014-02-05 NOTE — Assessment & Plan Note (Signed)
Several values of CBG's less than 60 on the glucometer, most of them are fasting.  A1C improved to 9.0 from 10.6. With values of hypoglycemia and HI in the same day, making the management very difficult.  Plans: Given multiple hypoglycemic spells, it is reasonable to back off on the insulin dosing and is better to keep hyperglycemic. Will reduce evening dose of Lantus to 7 units (given multiple hypoglycemic fasting blood sugars) Continue Lantus 7 units in AM. Recommended to check blood sugars daily and follow up with PCP in a month. Refilled glucose tablets, glucagon injections.

## 2014-02-06 ENCOUNTER — Telehealth: Payer: Self-pay | Admitting: Licensed Clinical Social Worker

## 2014-02-06 NOTE — Progress Notes (Signed)
Case discussed with Dr. Boggala at the time of the visit.  We reviewed the resident's history and exam and pertinent patient test results.  I agree with the assessment, diagnosis, and plan of care documented in the resident's note. 
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
## 2014-02-09 ENCOUNTER — Telehealth: Payer: Self-pay | Admitting: *Deleted

## 2014-02-09 NOTE — Telephone Encounter (Signed)
Pt left a message on [facility] LeashDonna Plyler recording she needs refill on oxycodone from ER 01/21/14. Left message on ID recording needs appt with Dr Rogelia BogaButcher for this pain med. Stanton KidneyDebra Rachit Grim RN 02/09/14 9AM

## 2014-02-10 ENCOUNTER — Telehealth: Payer: Self-pay | Admitting: Internal Medicine

## 2014-02-10 ENCOUNTER — Telehealth: Payer: Self-pay | Admitting: Dietician

## 2014-02-10 NOTE — Telephone Encounter (Signed)
Called pt again today, no answer.  Message left for her to call clinic to address pain and medication. DD also called pt yesterday about the same and left message.
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
## 2014-02-10 NOTE — Telephone Encounter (Signed)
Patient [facility] to [facility],  Says she is waiting to get chemstrips, right leg hurting, asking for pain meds and battery for wheel chair. Gave her address of [place] and told her i would pass on the message about her leg hurting. She agreed to this plan.

## 2014-02-10 NOTE — Telephone Encounter (Signed)
Thank you.  I will notify [facility].

## 2014-02-11 ENCOUNTER — Telehealth: Payer: Self-pay | Admitting: Dietician

## 2014-02-11 NOTE — Telephone Encounter (Signed)
Patient called requesting pain medicine, says the naproxen is not working and she needs oxycontin. I told her she needed to speak with our triage nurse about a request for pain medicine and she said she was transferred to my number when she called the main office number. I told her she may need to make an appointment to speak with the doctor again,but that she should call in the morning to speak to the triage nurse. If she cannot wait until then she may want to consider the ED for help with her pain.

## 2014-02-12 ENCOUNTER — Telehealth: Payer: Self-pay | Admitting: Dietician

## 2014-02-12 ENCOUNTER — Encounter: Payer: Self-pay | Admitting: Internal Medicine

## 2014-02-12 ENCOUNTER — Ambulatory Visit (INDEPENDENT_AMBULATORY_CARE_PROVIDER_SITE_OTHER): Payer: Medicaid Other | Admitting: Internal Medicine

## 2014-02-12 VITALS — BP 114/72 | HR 94 | Temp 97.6°F | Ht 70.0 in

## 2014-02-12 DIAGNOSIS — E1065 Type 1 diabetes mellitus with hyperglycemia: Secondary | ICD-10-CM

## 2014-02-12 DIAGNOSIS — IMO0002 Reserved for concepts with insufficient information to code with codable children: Secondary | ICD-10-CM

## 2014-02-12 DIAGNOSIS — R52 Pain, unspecified: Secondary | ICD-10-CM

## 2014-02-12 LAB — GLUCOSE, CAPILLARY: Glucose-Capillary: 423 mg/dL — ABNORMAL HIGH (ref 70–99)

## 2014-02-12 NOTE — Telephone Encounter (Signed)
Says she has an appointment today about her pain medicine. Asked for our address and zip code and reassurance that her pain and pain medicine would be addressed at the visit. Assured her that we'd take the best possible care of her that we can.

## 2014-02-12 NOTE — Telephone Encounter (Signed)
Appointment scheduled for today and next week with PCP

## 2014-02-12 NOTE — Progress Notes (Signed)
   Subjective:    Patient ID: [name], female    DOB: [birth year], 48 y.o.   MRN: [id number]  HPI Comments: [name] is a 48 year old woman with a PMH of DM type 1 (since childhood, poorly controlled), anemia, hx of substance abuse and unspecified mental disorder.  Presents for refill of narcotic prescribed by ED.  Reports chronic pain in both arms/right leg and back going on for several months since she was in hospital (last admission April 2014 per [facility]).  She says the pain was caused by sticks in hospital, prior falls and she recently had to have IO access to leg when she presented to ED with hypoglycemia 3 weeks ago.  Says she fell yesterday because legs were painful.  She was at home trying to walk up steps and fell forward.  She did not hit her head but hit her leg against the step.   No new pain, but still with chronic pain.  She was prescribed naprosyn for pain but says it made her blood sugar low so she can't take it.  She has since gotten percocet from emergency room for pain and wants us to refill it.    DM - difficult to control with many admissions for hypoglycemia.  She reports compliance with Lantus 7 units qHS.  BG 423 in clinic.  She just ate lunch - including baked chicken sandwich, green beans and sugar free drink.  CBGs at home are usually in 200s but she hasn't checked recently because she is out of strips (checking 5-6 times per day).  Felt hypoglycemic two days ago, checked BG on a family member's meter and it was 106.  She ate and the blood sugar increased to 179.  Denies smoking, EtOH or drug use.         Review of Systems  Constitutional: Negative for fever, chills and appetite change.  Respiratory: Negative for shortness of breath.   Cardiovascular: Negative for chest pain.  Gastrointestinal: Negative for nausea, vomiting, abdominal pain, diarrhea and constipation.  Genitourinary: Negative for dysuria and frequency.  Neurological: Negative for weakness and  light-headedness.       Objective:   Physical Exam  Vitals reviewed. Constitutional: She is oriented to person, place, and time. She appears well-developed. No distress.  HENT:  Head: Normocephalic and atraumatic.  Mouth/Throat: Oropharynx is clear and moist. No oropharyngeal exudate.  Eyes: EOM are normal. Pupils are equal, round, and reactive to light.  Cardiovascular: Normal rate, regular rhythm and normal heart sounds.  Exam reveals no gallop and no friction rub.   No murmur heard. Pulmonary/Chest: Effort normal and breath sounds normal. No respiratory distress. She has no wheezes. She has no rales.  Abdominal: Soft. Bowel sounds are normal. She exhibits no distension. There is no tenderness.  Musculoskeletal: Normal range of motion. She exhibits tenderness. She exhibits no edema.  Right anterior leg TTP; no calf tenderness, no edema. Right arm minimally TTP   Lymphadenopathy:    She has no cervical adenopathy.  Neurological: She is alert and oriented to person, place, and time. No cranial nerve deficit.  Skin: Skin is warm. She is not diaphoretic.  Psychiatric: She has a normal mood and affect.          Assessment & Plan:  Please see problem based assessment and plan.

## 2014-02-12 NOTE — Patient Instructions (Signed)
1. Please take Advil or Tylenol for your pain.  Follow-up with Dr. Rogelia BogaButcher next week.  I am refilling your strips.  Please check your blood sugar 3-4 times per day and bring the meter to your visit with Dr. Rogelia BogaButcher   2. Please take all medications as prescribed.    3. If you have worsening of your symptoms or new symptoms arise, please call the clinic [phone]), or go to the ER immediately if symptoms are severe.

## 2014-02-13 ENCOUNTER — Encounter: Payer: Self-pay | Admitting: Licensed Clinical Social Worker

## 2014-02-13 DIAGNOSIS — R52 Pain, unspecified: Secondary | ICD-10-CM | POA: Insufficient documentation

## 2014-02-13 NOTE — Progress Notes (Signed)
Patient ID: [name], female   DOB: 1965/10/25, 48 y.o.   MRN: [id number] Note for [facility] Care Manager, Risa Grillhiquita Striblin, [place] dated 02/12/14 regarding referral to [facility]:  [name] came in for her assessment today and refused to complete the paperwork again. She signed a decline of services form. I have cc'd the clinician, Army Fossaiffany Hall, on this email in case you would like her to provide more information about the appointment. We will not be able to schedule another assessment with her. Please let me know if you have any questions. Thank you, Counselling psychologistAmber Chrismon Intake/QM Coordinator [facility]

## 2014-02-13 NOTE — Progress Notes (Signed)
INTERNAL MEDICINE TEACHING ATTENDING ADDENDUM - Adrina Armijo, MD: I reviewed and discussed at the time of visit with the resident Dr. Wilson, the patient's medical history, physical examination, diagnosis and results of pertinent tests and treatment and I agree with the patient's care as documented.  

## 2014-02-13 NOTE — Assessment & Plan Note (Addendum)
Generalized pain, chronic.  Unclear etiology - blood draws in hospital?, recent IO in leg.  No signs of recent trauma and she report pain is chronic (several months).  She has been prescribed naprosyn but says she can't tolerate it.  Want refill of narcotic prescribed by ED.  I do not feel comfortable prescribing narcotic today given no acute change in pain, no clear cause, hx of substance abuse and the clinic has not prescribed narcotics for her previously.  I would rather defer to her PCP who knows her better and will be better able to determine need and appropriateness of narcotic.  The patient was advised to try another NSAID or Tylenol prn for pain control until her appointment with PCP next week.

## 2014-02-16 ENCOUNTER — Emergency Department (HOSPITAL_COMMUNITY)
Admission: EM | Admit: 2014-02-16 | Discharge: 2014-02-17 | Disposition: A | Discharge status: Home / Self Care | Payer: Medicaid Other | Attending: Emergency Medicine | Admitting: Emergency Medicine

## 2014-02-16 ENCOUNTER — Emergency Department (HOSPITAL_COMMUNITY): Payer: Medicaid Other

## 2014-02-16 ENCOUNTER — Encounter (HOSPITAL_COMMUNITY): Payer: Self-pay | Admitting: Emergency Medicine

## 2014-02-16 DIAGNOSIS — Z3202 Encounter for pregnancy test, result negative: Secondary | ICD-10-CM | POA: Diagnosis not present

## 2014-02-16 DIAGNOSIS — E162 Hypoglycemia, unspecified: Secondary | ICD-10-CM

## 2014-02-16 DIAGNOSIS — Z9889 Other specified postprocedural states: Secondary | ICD-10-CM | POA: Insufficient documentation

## 2014-02-16 DIAGNOSIS — J4489 Other specified chronic obstructive pulmonary disease: Secondary | ICD-10-CM | POA: Insufficient documentation

## 2014-02-16 DIAGNOSIS — Z794 Long term (current) use of insulin: Secondary | ICD-10-CM | POA: Insufficient documentation

## 2014-02-16 DIAGNOSIS — E1169 Type 2 diabetes mellitus with other specified complication: Secondary | ICD-10-CM | POA: Insufficient documentation

## 2014-02-16 DIAGNOSIS — M79609 Pain in unspecified limb: Secondary | ICD-10-CM | POA: Diagnosis not present

## 2014-02-16 DIAGNOSIS — Z8669 Personal history of other diseases of the nervous system and sense organs: Secondary | ICD-10-CM | POA: Diagnosis not present

## 2014-02-16 DIAGNOSIS — Z862 Personal history of diseases of the blood and blood-forming organs and certain disorders involving the immune mechanism: Secondary | ICD-10-CM | POA: Insufficient documentation

## 2014-02-16 DIAGNOSIS — R110 Nausea: Secondary | ICD-10-CM | POA: Insufficient documentation

## 2014-02-16 DIAGNOSIS — G8929 Other chronic pain: Secondary | ICD-10-CM | POA: Diagnosis not present

## 2014-02-16 DIAGNOSIS — Z87891 Personal history of nicotine dependence: Secondary | ICD-10-CM | POA: Diagnosis not present

## 2014-02-16 DIAGNOSIS — J449 Chronic obstructive pulmonary disease, unspecified: Secondary | ICD-10-CM | POA: Insufficient documentation

## 2014-02-16 DIAGNOSIS — M549 Dorsalgia, unspecified: Secondary | ICD-10-CM | POA: Diagnosis not present

## 2014-02-16 LAB — CBC WITH DIFFERENTIAL/PLATELET
BASOS PCT: 0 % (ref 0–1)
Basophils Absolute: 0 10*3/uL (ref 0.0–0.1)
EOS PCT: 0 % (ref 0–5)
Eosinophils Absolute: 0 10*3/uL (ref 0.0–0.7)
HEMATOCRIT: 38.3 % (ref 36.0–46.0)
HEMOGLOBIN: 11.7 g/dL — AB (ref 12.0–15.0)
LYMPHS PCT: 15 % (ref 12–46)
Lymphs Abs: 1 10*3/uL (ref 0.7–4.0)
MCH: 20.3 pg — ABNORMAL LOW (ref 26.0–34.0)
MCHC: 30.5 g/dL (ref 30.0–36.0)
MCV: 66.6 fL — ABNORMAL LOW (ref 78.0–100.0)
Monocytes Absolute: 0.5 10*3/uL (ref 0.1–1.0)
Monocytes Relative: 7 % (ref 3–12)
NEUTROS ABS: 5.3 10*3/uL (ref 1.7–7.7)
NEUTROS PCT: 78 % — AB (ref 43–77)
Platelets: 157 10*3/uL (ref 150–400)
RBC: 5.75 MIL/uL — AB (ref 3.87–5.11)
RDW: 15.8 % — ABNORMAL HIGH (ref 11.5–15.5)
WBC: 6.8 10*3/uL (ref 4.0–10.5)

## 2014-02-16 LAB — COMPREHENSIVE METABOLIC PANEL
ALK PHOS: 48 U/L (ref 39–117)
ALT: 13 U/L (ref 0–35)
AST: 42 U/L — ABNORMAL HIGH (ref 0–37)
Albumin: 3.9 g/dL (ref 3.5–5.2)
Anion gap: 12 (ref 5–15)
BILIRUBIN TOTAL: 0.2 mg/dL — AB (ref 0.3–1.2)
BUN: 16 mg/dL (ref 6–23)
CHLORIDE: 100 meq/L (ref 96–112)
CO2: 25 meq/L (ref 19–32)
Calcium: 8.9 mg/dL (ref 8.4–10.5)
Creatinine, Ser: 0.59 mg/dL (ref 0.50–1.10)
GLUCOSE: 147 mg/dL — AB (ref 70–99)
POTASSIUM: 5.2 meq/L (ref 3.7–5.3)
SODIUM: 137 meq/L (ref 137–147)
Total Protein: 8.2 g/dL (ref 6.0–8.3)

## 2014-02-16 LAB — URINALYSIS, ROUTINE W REFLEX MICROSCOPIC
Bilirubin Urine: NEGATIVE
GLUCOSE, UA: 500 mg/dL — AB
HGB URINE DIPSTICK: NEGATIVE
KETONES UR: NEGATIVE mg/dL
LEUKOCYTES UA: NEGATIVE
Nitrite: NEGATIVE
PH: 7 (ref 5.0–8.0)
Protein, ur: 30 mg/dL — AB
Specific Gravity, Urine: 1.028 (ref 1.005–1.030)
Urobilinogen, UA: 0.2 mg/dL (ref 0.0–1.0)

## 2014-02-16 LAB — URINE MICROSCOPIC-ADD ON

## 2014-02-16 LAB — CBG MONITORING, ED
Glucose-Capillary: 117 mg/dL — ABNORMAL HIGH (ref 70–99)
Glucose-Capillary: 43 mg/dL — CL (ref 70–99)
Glucose-Capillary: 55 mg/dL — ABNORMAL LOW (ref 70–99)

## 2014-02-16 LAB — TROPONIN I: Troponin I: <0.3 ng/mL (ref <0.30)

## 2014-02-16 LAB — PREGNANCY, URINE: PREG TEST UR: NEGATIVE

## 2014-02-16 MED ORDER — HYDROCODONE-ACETAMINOPHEN 5-325 MG PO TABS
2.0000 | ORAL_TABLET | Freq: Once | ORAL | Status: AC
  Administered 2014-02-16: 2 via ORAL
  Filled 2014-02-16: qty 2

## 2014-02-16 MED ORDER — SODIUM CHLORIDE 0.9 % IV BOLUS (SEPSIS)
1000.0000 mL | Freq: Once | INTRAVENOUS | Status: AC
  Administered 2014-02-16: 1000 mL via INTRAVENOUS

## 2014-02-16 MED ORDER — DEXTROSE 50 % IV SOLN
50.0000 mL | Freq: Once | INTRAVENOUS | Status: DC
  Filled 2014-02-16: qty 50

## 2014-02-16 NOTE — ED Notes (Signed)
Unsuccessful iv attempt x2, pt requesting IV team be paged. IV team paged, en route to start IV.

## 2014-02-16 NOTE — ED Notes (Signed)
The patient is unable to give an urine specimen at this time. The patient has been advised to use call light for assistance. The tech has reported to the RN in charge. 

## 2014-02-16 NOTE — ED Notes (Signed)
Pt not eating, refusing to drink her orange juice, pt informed if she will not eat and cbg remains low that she may need to be admitted.

## 2014-02-16 NOTE — ED Notes (Signed)
Iv team at bedside  

## 2014-02-16 NOTE — ED Provider Notes (Signed)
CSN: [id number]     Arrival date & time 02/16/14  1806 History   First MD Initiated Contact with Patient 02/16/14 1821     Chief Complaint  Patient presents with  . Hypoglycemia     (Consider location/radiation/quality/duration/timing/severity/associated sxs/prior Treatment) HPI Comments: Patient [place] EMS from post office with episode of confusion and disorientation. She states she began to feel bad, lightheaded and dizzy he asked for EMS to be called. EMS found her to have a blood sugar of 20. She was given D50. Sugar improved to 79. she is now awake and alert. She denies complaint. She complains of chronic leg pain. She is unclear on the details of her chronic pain but states that she uses a wheelchair to get around. 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 She endorses a history of seizures and anemia and COPD. She states she took her insulin doses as regularly scheduled today but does not eat enough at lunch. Denies any fever, chills, chest pain, abdominal pain. She did not hit her head or lose consciousness. She states she takes Lantus 3 times a day as well as sliding scale with meals. It is unclear if she is taking her medications correctly.  The history is provided by the patient and the EMS personnel.    Past Medical History  Diagnosis Date  . Diabetes mellitus without complication    Past Surgical History  Procedure Laterality Date  . Tracheostomy    . Appendectomy     No family history on file. History  Substance Use Topics  . Smoking status: Former Games developer  . Smokeless tobacco: Not on file  . Alcohol Use: No   OB History   Grav Para Term Preterm Abortions TAB SAB Ect Mult Living                 Review of Systems  Constitutional: Positive for activity change and appetite change. Negative for fever and fatigue.  Respiratory: Negative for cough, chest tightness and shortness of breath.   Cardiovascular: Negative for chest pain.   Gastrointestinal: Positive for nausea. Negative for vomiting and abdominal pain.  Genitourinary: Negative for dysuria and hematuria.  Musculoskeletal: Positive for arthralgias, back pain and myalgias. Negative for neck pain and neck stiffness.  Skin: Negative for rash.  Neurological: Negative for dizziness, weakness and headaches.  A complete 10 system review of systems was obtained and all systems are negative except as noted in the HPI and PMH.      Allergies  Review of patient's allergies indicates no known allergies.  Home Medications   Prior to Admission medications   Medication Sig Start Date End Date Taking? Authorizing Provider  albuterol (PROVENTIL HFA;VENTOLIN HFA) 108 (90 BASE) MCG/ACT inhaler Inhale 2 puffs into the lungs every 4 (four) hours as needed for wheezing or shortness of breath.   Yes Historical Provider, MD  glucagon 1 MG injection Inject 1 mg into the vein once as needed (for emergent hypoglycemia).   Yes Historical Provider, MD  insulin glargine (LANTUS) 100 UNIT/ML injection Inject 7 Units into the skin 2 (two) times daily.   Yes Historical Provider, MD  latanoprost (XALATAN) 0.005 % ophthalmic solution Place 1 drop into both eyes at bedtime.   Yes Historical Provider, MD   BP 138/75  Pulse 93  Temp(Src) 98.5 F (36.9 C) (Oral)  Resp 19  Ht 5\' 10"  (1.778 m)  Wt 179 lb (81.194 kg)  BMI 25.68 kg/m2  SpO2 100%  LMP 02/05/2014 Physical Exam  Nursing note and vitals reviewed. Constitutional: She is oriented to person, place, and time. She appears well-developed and well-nourished. No distress.  Anxious. Became upset when asked to stop her phone call so she could be examined  HENT:  Head: Normocephalic and atraumatic.  Mouth/Throat: Oropharynx is clear and moist. No oropharyngeal exudate.  Hoarse voice  Eyes: Conjunctivae and EOM are normal. Pupils are equal, round, and reactive to light.  Neck: Normal range of motion. Neck supple.  Well-healed  tracheostomy site  Cardiovascular: Normal rate, regular rhythm, normal heart sounds and intact distal pulses.   No murmur heard. Pulmonary/Chest: Effort normal. No respiratory distress.  Upper respiratory noise with scattered expiratory wheezing  Abdominal: Soft. There is no tenderness. There is no rebound and no guarding.  Musculoskeletal: Normal range of motion. She exhibits no edema and no tenderness.  Neurological: She is alert and oriented to person, place, and time. No cranial nerve deficit. She exhibits normal muscle tone. Coordination normal.  5/5 strength in bilateral lower extremities. Ankle plantar and dorsiflexion intact. Great toe extension intact bilaterally. +2 DP and PT pulses. +2 patellar reflexes bilaterally.   Skin: Skin is warm.  Psychiatric: She has a normal mood and affect. Her behavior is normal.    ED Course  Procedures (including critical care time) Labs Review Labs Reviewed  URINALYSIS, ROUTINE W REFLEX MICROSCOPIC - Abnormal; Notable for the following:    Glucose, UA 500 (*)    Protein, ur 30 (*)    All other components within normal limits  CBC WITH DIFFERENTIAL - Abnormal; Notable for the following:    RBC 5.75 (*)    Hemoglobin 11.7 (*)    MCV 66.6 (*)    MCH 20.3 (*)    RDW 15.8 (*)    Neutrophils Relative % 78 (*)    All other components within normal limits  COMPREHENSIVE METABOLIC PANEL - Abnormal; Notable for the following:    Glucose, Bld 147 (*)    AST 42 (*)    Total Bilirubin 0.2 (*)    All other components within normal limits  URINE MICROSCOPIC-ADD ON - Abnormal; Notable for the following:    Squamous Epithelial / LPF MANY (*)    All other components within normal limits  CBG MONITORING, ED - Abnormal; Notable for the following:    Glucose-Capillary 117 (*)    All other components within normal limits  CBG MONITORING, ED - Abnormal; Notable for the following:    Glucose-Capillary 43 (*)    All other components within normal limits   CBG MONITORING, ED - Abnormal; Notable for the following:    Glucose-Capillary 55 (*)    All other components within normal limits  PREGNANCY, URINE  TROPONIN I    Imaging Review Dg Neck Soft Tissue  02/16/2014   CLINICAL DATA:  Hypoglycemia.  Neck pain  EXAM: NECK SOFT TISSUES - 1+ VIEW  COMPARISON:  None.  FINDINGS: There is no evidence of retropharyngeal soft tissue swelling or epiglottic enlargement. The cervical airway is unremarkable and no radio-opaque foreign body identified.  IMPRESSION: Negative.   Electronically Signed   By: Marlan Palau M.D.   On: 02/16/2014 20:23   Dg Chest 2 View  02/16/2014   CLINICAL DATA:  Hypoglycemia.  Fall.  EXAM: CHEST  2 VIEW  COMPARISON:  None.  FINDINGS: The heart size and mediastinal contours are within normal limits. Both lungs are clear. The visualized skeletal structures are unremarkable.  IMPRESSION: No  active cardiopulmonary disease.   Electronically Signed   By: Marlan Palauharles  Clark M.D.   On: 02/16/2014 20:23   Ct Head Wo Contrast  02/16/2014   CLINICAL DATA:  Syncope.  EXAM: CT HEAD WITHOUT CONTRAST  TECHNIQUE: Contiguous axial images were obtained from the base of the skull through the vertex without intravenous contrast.  COMPARISON:  None.  FINDINGS: Skull and Sinuses:Negative for fracture or destructive process. The mastoids, middle ears, and imaged paranasal sinuses are clear.  Orbits: Probable bilateralcataract resection.  Brain: No evidence of acute abnormality, such as acute infarction, hemorrhage, hydrocephalus, or mass lesion/mass effect. Low cerebral volume for age.  IMPRESSION: No acute intracranial abnormality.   Electronically Signed   By: Tiburcio PeaJonathan  Watts M.D.   On: 02/16/2014 21:24     EKG Interpretation   Date/Time:  Monday February 16 2014 18:43:22 EDT Ventricular Rate:  103 PR Interval:  158 QRS Duration: 77 QT Interval:  359 QTC Calculation: 470 R Axis:   64 Text Interpretation:  Sinus tachycardia Right atrial enlargement ST  elev,  probable normal early repol pattern No previous ECGs available Confirmed  by Winona Sison  MD, Lynnae Ludemann [phone]([id number]) on 02/16/2014 6:50:15 PM      MDM   Final diagnoses:  None   disorientation near syncopal episode attributed to hypoglycemia. Patient feels back to baseline now. Blood sugar 117 on arrival. Complains of chronic leg pain. Distal pulses intact   Workup was remarkable for hypoglycemia. Improved to 147 after D50. Patient denies any excessive insulin use. She states she didn't eat enough today.  Her labs are otherwise unremarkable. CT head negative. Chest x-ray negative. Tachycardia has improved. UA negative. No evidence of infection. She is tolerating by mouth fluids and food.  Blood sugar has decreased into the 50s. Patient encouraged to keep eating. She has been making phone calls instead of eating the food given to her.  Sugar improved to 78. Vitals stable.  No distress. Care transferred to Dr. Ranae PalmsYelverton at sign out.  Anticipate discharge when sugar consistently above 100.  BP 124/91  Pulse 98  Temp(Src) 98.2 F (36.8 C) (Oral)  Resp 14  Ht 5\' 10"  (1.778 m)  Wt 179 lb (81.194 kg)  BMI 25.68 kg/m2  SpO2 100%  LMP 02/05/2014   Glynn OctaveStephen Hendricks Schwandt, MD 02/17/14 1006

## 2014-02-16 NOTE — ED Notes (Signed)
Pt given happy meal and also orange juice, pt encouraged to eat and cbg will be withdrawn.

## 2014-02-16 NOTE — ED Notes (Signed)
Per EMS: [place], by standers reported that pt was disoriented and confused. Upon EMS arrival, pt cbg noted to be 20, en route pt given 12.5 mg of d50 before IV infiltration. Last CBG taken by EMS was 79. Upon arrival to ED, pt axo x4. Nad noted. Pt denies any cp or sob.

## 2014-02-16 NOTE — ED Notes (Signed)
Dondra SpryGail, NP and this RN went in to try and start an IV, pt grabbed this RNs arm. RN noted to have scratch marks to left forearm. Nad noted.

## 2014-02-16 NOTE — ED Notes (Signed)
The patient refused a rectal temp x2. The tech reported to the RN in charge.

## 2014-02-17 ENCOUNTER — Telehealth: Payer: Self-pay | Admitting: Dietician

## 2014-02-17 LAB — CBG MONITORING, ED
GLUCOSE-CAPILLARY: 78 mg/dL (ref 70–99)
Glucose-Capillary: 107 mg/dL — ABNORMAL HIGH (ref 70–99)

## 2014-02-17 MED ORDER — HYDROCODONE-ACETAMINOPHEN 5-325 MG PO TABS
1.0000 | ORAL_TABLET | Freq: Once | ORAL | Status: AC
  Administered 2014-02-17: 1 via ORAL
  Filled 2014-02-17: qty 1

## 2014-02-17 NOTE — Telephone Encounter (Signed)
Patient left a voicemail that she is in the Emergency room E-41. She says she may be admitted. Acknowledged her upcoming appointment on August 6th with Dr. Rogelia BogaButcher.

## 2014-02-17 NOTE — Discharge Instructions (Signed)
Hypoglycemia Do not take insulin without eating. Take your medications as prescribed. Follow up with your doctor. Return to the ED if you develop new or worsening symptoms. Hypoglycemia occurs when the glucose in your blood is too low. Glucose is a type of sugar that is your body's main energy source. Hormones, such as insulin and glucagon, control the level of glucose in the blood. Insulin lowers blood glucose and glucagon increases blood glucose. Having too much insulin in your blood stream, or not eating enough food containing sugar, can result in hypoglycemia. Hypoglycemia can happen to people with or without diabetes. It can develop quickly and can be a medical emergency.  CAUSES   Missing or delaying meals.  Not eating enough carbohydrates at meals.  Taking too much diabetes medicine.  Not timing your oral diabetes medicine or insulin doses with meals, snacks, and exercise.  Nausea and vomiting.  Certain medicines.  Severe illnesses, such as hepatitis, kidney disorders, and certain eating disorders.  Increased activity or exercise without eating something extra or adjusting medicines.  Drinking too much alcohol.  A nerve disorder that affects body functions like your heart rate, blood pressure, and digestion (autonomic neuropathy).  A condition where the stomach muscles do not function properly (gastroparesis). Therefore, medicines and food may not absorb properly.  Rarely, a tumor of the pancreas can produce too much insulin. SYMPTOMS   Hunger.  Sweating (diaphoresis).  Change in body temperature.  Shakiness.  Headache.  Anxiety.  Lightheadedness.  Irritability.  Difficulty concentrating.  Dry mouth.  Tingling or numbness in the hands or feet.  Restless sleep or sleep disturbances.  Altered speech and coordination.  Change in mental status.  Seizures or prolonged convulsions.  Combativeness.  Drowsiness (lethargic).  Weakness.  Increased heart  rate or palpitations.  Confusion.  Pale, gray skin color.  Blurred or double vision.  Fainting. DIAGNOSIS  A physical exam and medical history will be performed. Your caregiver may make a diagnosis based on your symptoms. Blood tests and other lab tests may be performed to confirm a diagnosis. Once the diagnosis is made, your caregiver will see if your signs and symptoms go away once your blood glucose is raised.  TREATMENT  Usually, you can easily treat your hypoglycemia when you notice symptoms.  Check your blood glucose. If it is less than 70 mg/dl, take one of the following:   3-4 glucose tablets.    cup juice.    cup regular soda.   1 cup skim milk.   -1 tube of glucose gel.   5-6 hard candies.   Avoid high-fat drinks or food that may delay a rise in blood glucose levels.  Do not take more than the recommended amount of sugary foods, drinks, gel, or tablets. Doing so will cause your blood glucose to go too high.   Wait 10-15 minutes and recheck your blood glucose. If it is still less than 70 mg/dl or below your target range, repeat treatment.   Eat a snack if it is more than 1 hour until your next meal.  There may be a time when your blood glucose may go so low that you are unable to treat yourself at home when you start to notice symptoms. You may need someone to help you. You may even faint or be unable to swallow. If you cannot treat yourself, someone will need to bring you to the hospital.  HOME CARE INSTRUCTIONS  If you have diabetes, follow your diabetes management plan by:  Taking your medicines as directed.  Following your exercise plan.  Following your meal plan. Do not skip meals. Eat on time.  Testing your blood glucose regularly. Check your blood glucose before and after exercise. If you exercise longer or different than usual, be sure to check blood glucose more frequently.  Wearing your medical alert jewelry that says you have  diabetes.  Identify the cause of your hypoglycemia. Then, develop ways to prevent the recurrence of hypoglycemia.  Do not take a hot bath or shower right after an insulin shot.  Always carry treatment with you. Glucose tablets are the easiest to carry.  If you are going to drink alcohol, drink it only with meals.  Tell friends or family members ways to keep you safe during a seizure. This may include removing hard or sharp objects from the area or turning you on your side.  Maintain a healthy weight. SEEK MEDICAL CARE IF:   You are having problems keeping your blood glucose in your target range.  You are having frequent episodes of hypoglycemia.  You feel you might be having side effects from your medicines.  You are not sure why your blood glucose is dropping so low.  You notice a change in vision or a new problem with your vision. SEEK IMMEDIATE MEDICAL CARE IF:   Confusion develops.  A change in mental status occurs.  The inability to swallow develops.  Fainting occurs. Document Released: 07/03/2005 Document Revised: 07/08/2013 Document Reviewed: 10/30/2011 [facility] Patient Information 2015 [employer] [place]. This information is not intended to replace advice given to you by your health care provider. Make sure you discuss any questions you have with your health care provider.

## 2014-02-19 ENCOUNTER — Telehealth: Payer: Self-pay | Admitting: *Deleted

## 2014-02-19 ENCOUNTER — Encounter: Payer: Medicaid Other | Admitting: Internal Medicine

## 2014-02-19 ENCOUNTER — Encounter: Payer: Self-pay | Admitting: Internal Medicine

## 2014-02-19 ENCOUNTER — Ambulatory Visit: Payer: Medicaid Other | Admitting: Internal Medicine

## 2014-02-19 NOTE — Progress Notes (Signed)
Patient ID: [name], female   DOB: 07/05/1966, 48 y.o.   MRN: [id number]  A user error has taken place: encounter opened in error, closed for administrative reasons.

## 2014-02-19 NOTE — Telephone Encounter (Signed)
Pt presented late once again for a pcp appt w/ dr Midwifebutcher, she was informed she would need to reschedule and be on time for appt. She became upset and states she is sick and has been and has been in the hosp, she has on an ED bracelet, the last visit that i can locate is 7/8, she states no, she was actually in the hosp but bracelet clearly states ED. She then gets to what she wants to be seen for which is pain medicine, she states she has not been well since last hosp visit and she needs pain med to deal with the pain She is scheduled w/ dr gill this pm at 1315

## 2014-02-19 NOTE — Telephone Encounter (Signed)
CDE saw patient in waiting room . She says she has plenty of test strips now. She requested a refill on glucagon. I told her she had refills and to call her drug store.

## 2014-02-20 ENCOUNTER — Inpatient Hospital Stay (HOSPITAL_COMMUNITY)
Admission: EM | Admit: 2014-02-20 | Discharge: 2014-03-04 | DRG: 004 | Disposition: A | Discharge status: Other Acute Inpatient Hospital | Payer: Medicaid Other | Attending: Internal Medicine | Admitting: Internal Medicine

## 2014-02-20 ENCOUNTER — Emergency Department (HOSPITAL_COMMUNITY): Payer: Medicaid Other

## 2014-02-20 ENCOUNTER — Encounter (HOSPITAL_COMMUNITY): Payer: Self-pay | Admitting: Emergency Medicine

## 2014-02-20 DIAGNOSIS — IMO0002 Reserved for concepts with insufficient information to code with codable children: Secondary | ICD-10-CM

## 2014-02-20 DIAGNOSIS — Z794 Long term (current) use of insulin: Secondary | ICD-10-CM | POA: Diagnosis not present

## 2014-02-20 DIAGNOSIS — J392 Other diseases of pharynx: Secondary | ICD-10-CM | POA: Diagnosis not present

## 2014-02-20 DIAGNOSIS — Z930 Tracheostomy status: Secondary | ICD-10-CM | POA: Diagnosis not present

## 2014-02-20 DIAGNOSIS — J9620 Acute and chronic respiratory failure, unspecified whether with hypoxia or hypercapnia: Secondary | ICD-10-CM | POA: Diagnosis present

## 2014-02-20 DIAGNOSIS — L97909 Non-pressure chronic ulcer of unspecified part of unspecified lower leg with unspecified severity: Secondary | ICD-10-CM | POA: Diagnosis present

## 2014-02-20 DIAGNOSIS — R9431 Abnormal electrocardiogram [ECG] [EKG]: Secondary | ICD-10-CM | POA: Diagnosis present

## 2014-02-20 DIAGNOSIS — J329 Chronic sinusitis, unspecified: Secondary | ICD-10-CM | POA: Diagnosis present

## 2014-02-20 DIAGNOSIS — Z9911 Dependence on respirator [ventilator] status: Secondary | ICD-10-CM | POA: Diagnosis not present

## 2014-02-20 DIAGNOSIS — Z781 Physical restraint status: Secondary | ICD-10-CM | POA: Diagnosis present

## 2014-02-20 DIAGNOSIS — E162 Hypoglycemia, unspecified: Secondary | ICD-10-CM | POA: Diagnosis present

## 2014-02-20 DIAGNOSIS — E46 Unspecified protein-calorie malnutrition: Secondary | ICD-10-CM | POA: Diagnosis present

## 2014-02-20 DIAGNOSIS — Z87891 Personal history of nicotine dependence: Secondary | ICD-10-CM | POA: Diagnosis not present

## 2014-02-20 DIAGNOSIS — E876 Hypokalemia: Secondary | ICD-10-CM | POA: Diagnosis not present

## 2014-02-20 DIAGNOSIS — R1310 Dysphagia, unspecified: Secondary | ICD-10-CM | POA: Diagnosis not present

## 2014-02-20 DIAGNOSIS — R4182 Altered mental status, unspecified: Secondary | ICD-10-CM | POA: Diagnosis present

## 2014-02-20 DIAGNOSIS — G9340 Encephalopathy, unspecified: Secondary | ICD-10-CM

## 2014-02-20 DIAGNOSIS — G931 Anoxic brain damage, not elsewhere classified: Secondary | ICD-10-CM | POA: Diagnosis present

## 2014-02-20 DIAGNOSIS — R000 Tachycardia, unspecified: Secondary | ICD-10-CM | POA: Diagnosis present

## 2014-02-20 DIAGNOSIS — E1169 Type 2 diabetes mellitus with other specified complication: Principal | ICD-10-CM | POA: Diagnosis present

## 2014-02-20 DIAGNOSIS — R404 Transient alteration of awareness: Secondary | ICD-10-CM

## 2014-02-20 DIAGNOSIS — J690 Pneumonitis due to inhalation of food and vomit: Secondary | ICD-10-CM | POA: Diagnosis present

## 2014-02-20 DIAGNOSIS — J9622 Acute and chronic respiratory failure with hypercapnia: Secondary | ICD-10-CM

## 2014-02-20 DIAGNOSIS — D649 Anemia, unspecified: Secondary | ICD-10-CM | POA: Diagnosis present

## 2014-02-20 DIAGNOSIS — Z79899 Other long term (current) drug therapy: Secondary | ICD-10-CM

## 2014-02-20 DIAGNOSIS — J9600 Acute respiratory failure, unspecified whether with hypoxia or hypercapnia: Secondary | ICD-10-CM | POA: Diagnosis not present

## 2014-02-20 DIAGNOSIS — J9601 Acute respiratory failure with hypoxia: Secondary | ICD-10-CM

## 2014-02-20 LAB — CBC WITH DIFFERENTIAL/PLATELET
BASOS ABS: 0 10*3/uL (ref 0.0–0.1)
Basophils Relative: 0 % (ref 0–1)
Eosinophils Absolute: 0 10*3/uL (ref 0.0–0.7)
Eosinophils Relative: 0 % (ref 0–5)
HCT: 34.7 % — ABNORMAL LOW (ref 36.0–46.0)
Hemoglobin: 10.6 g/dL — ABNORMAL LOW (ref 12.0–15.0)
LYMPHS PCT: 6 % — AB (ref 12–46)
Lymphs Abs: 0.7 10*3/uL (ref 0.7–4.0)
MCH: 20.3 pg — ABNORMAL LOW (ref 26.0–34.0)
MCHC: 30.5 g/dL (ref 30.0–36.0)
MCV: 66.5 fL — ABNORMAL LOW (ref 78.0–100.0)
MONOS PCT: 7 % (ref 3–12)
Monocytes Absolute: 0.8 10*3/uL (ref 0.1–1.0)
NEUTROS PCT: 87 % — AB (ref 43–77)
Neutro Abs: 10.1 10*3/uL — ABNORMAL HIGH (ref 1.7–7.7)
PLATELETS: 161 10*3/uL (ref 150–400)
RBC: 5.22 MIL/uL — ABNORMAL HIGH (ref 3.87–5.11)
RDW: 15.5 % (ref 11.5–15.5)
WBC: 11.6 10*3/uL — AB (ref 4.0–10.5)

## 2014-02-20 LAB — URINALYSIS, ROUTINE W REFLEX MICROSCOPIC
Bilirubin Urine: NEGATIVE
Glucose, UA: 100 mg/dL — AB
Hgb urine dipstick: NEGATIVE
Ketones, ur: 40 mg/dL — AB
Leukocytes, UA: NEGATIVE
NITRITE: NEGATIVE
Protein, ur: 30 mg/dL — AB
SPECIFIC GRAVITY, URINE: 1.023 (ref 1.005–1.030)
UROBILINOGEN UA: 0.2 mg/dL (ref 0.0–1.0)
pH: 6 (ref 5.0–8.0)

## 2014-02-20 LAB — I-STAT ARTERIAL BLOOD GAS, ED
ACID-BASE EXCESS: 5 mmol/L — AB (ref 0.0–2.0)
Bicarbonate: 28.5 mEq/L — ABNORMAL HIGH (ref 20.0–24.0)
O2 Saturation: 100 %
PH ART: 7.478 — AB (ref 7.350–7.450)
TCO2: 30 mmol/L (ref 0–100)
pCO2 arterial: 38.5 mmHg (ref 35.0–45.0)
pO2, Arterial: 491 mmHg — ABNORMAL HIGH (ref 80.0–100.0)

## 2014-02-20 LAB — COMPREHENSIVE METABOLIC PANEL
ALT: 19 U/L (ref 0–35)
ANION GAP: 14 (ref 5–15)
AST: 45 U/L — ABNORMAL HIGH (ref 0–37)
Albumin: 3.4 g/dL — ABNORMAL LOW (ref 3.5–5.2)
Alkaline Phosphatase: 52 U/L (ref 39–117)
BILIRUBIN TOTAL: 0.3 mg/dL (ref 0.3–1.2)
BUN: 18 mg/dL (ref 6–23)
CHLORIDE: 101 meq/L (ref 96–112)
CO2: 24 mEq/L (ref 19–32)
CREATININE: 0.6 mg/dL (ref 0.50–1.10)
Calcium: 8.2 mg/dL — ABNORMAL LOW (ref 8.4–10.5)
Glucose, Bld: 146 mg/dL — ABNORMAL HIGH (ref 70–99)
Potassium: 4.3 mEq/L (ref 3.7–5.3)
Sodium: 139 mEq/L (ref 137–147)
Total Protein: 7.5 g/dL (ref 6.0–8.3)

## 2014-02-20 LAB — CBG MONITORING, ED
GLUCOSE-CAPILLARY: 121 mg/dL — AB (ref 70–99)
GLUCOSE-CAPILLARY: 82 mg/dL (ref 70–99)
GLUCOSE-CAPILLARY: 98 mg/dL (ref 70–99)

## 2014-02-20 LAB — SALICYLATE LEVEL: Salicylate Lvl: <2 mg/dL — ABNORMAL LOW (ref 2.8–20.0)

## 2014-02-20 LAB — MAGNESIUM: Magnesium: 1.7 mg/dL (ref 1.5–2.5)

## 2014-02-20 LAB — AMMONIA: Ammonia: 19 umol/L (ref 11–60)

## 2014-02-20 LAB — RAPID URINE DRUG SCREEN, HOSP PERFORMED
Amphetamines: NOT DETECTED
Barbiturates: NOT DETECTED
Benzodiazepines: POSITIVE — AB
COCAINE: NOT DETECTED
Opiates: NOT DETECTED
TETRAHYDROCANNABINOL: NOT DETECTED

## 2014-02-20 LAB — URINE MICROSCOPIC-ADD ON

## 2014-02-20 LAB — ACETAMINOPHEN LEVEL

## 2014-02-20 LAB — I-STAT CG4 LACTIC ACID, ED: Lactic Acid, Venous: 1.03 mmol/L (ref 0.5–2.2)

## 2014-02-20 LAB — ETHANOL: Alcohol, Ethyl (B): <11 mg/dL (ref 0–11)

## 2014-02-20 MED ORDER — ACETAMINOPHEN 325 MG PO TABS: 650.0000 mg | ORAL_TABLET | Freq: Four times a day (QID) | ORAL | Status: DC | PRN

## 2014-02-20 MED ORDER — HALOPERIDOL LACTATE 5 MG/ML IJ SOLN
10.0000 mg | Freq: Once | INTRAMUSCULAR | Status: AC
  Administered 2014-02-20: 10 mg via INTRAMUSCULAR

## 2014-02-20 MED ORDER — ETOMIDATE 2 MG/ML IV SOLN
INTRAVENOUS | Status: AC
  Filled 2014-02-20: qty 20

## 2014-02-20 MED ORDER — PIPERACILLIN-TAZOBACTAM 3.375 G IVPB 30 MIN
3.3750 g | Freq: Once | INTRAVENOUS | Status: AC
  Administered 2014-02-21: 3.375 g via INTRAVENOUS
  Filled 2014-02-20: qty 50

## 2014-02-20 MED ORDER — FAMOTIDINE 20 MG PO TABS
20.0000 mg | ORAL_TABLET | Freq: Two times a day (BID) | ORAL | Status: DC
  Administered 2014-02-21: 20 mg
  Filled 2014-02-20 (×2): qty 1

## 2014-02-20 MED ORDER — CETYLPYRIDINIUM CHLORIDE 0.05 % MT LIQD: 7.0000 mL | Freq: Four times a day (QID) | OROMUCOSAL | Status: DC

## 2014-02-20 MED ORDER — DEXTROSE-NACL 5-0.45 % IV SOLN
INTRAVENOUS | Status: DC
  Administered 2014-02-21: 75 mL/h via INTRAVENOUS

## 2014-02-20 MED ORDER — HALOPERIDOL LACTATE 5 MG/ML IJ SOLN
5.0000 mg | Freq: Once | INTRAMUSCULAR | Status: DC
  Filled 2014-02-20: qty 1

## 2014-02-20 MED ORDER — LORAZEPAM 2 MG/ML IJ SOLN
2.0000 mg | Freq: Once | INTRAMUSCULAR | Status: DC
  Filled 2014-02-20: qty 1

## 2014-02-20 MED ORDER — PROPOFOL 10 MG/ML IV EMUL: 5.0000 ug/kg/min | INTRAVENOUS | Status: DC

## 2014-02-20 MED ORDER — INSULIN ASPART 100 UNIT/ML ~~LOC~~ SOLN
1.0000 [IU] | SUBCUTANEOUS | Status: DC
  Administered 2014-02-21: 3 [IU] via SUBCUTANEOUS
  Administered 2014-02-21: 2 [IU] via SUBCUTANEOUS

## 2014-02-20 MED ORDER — LORAZEPAM 2 MG/ML IJ SOLN
2.0000 mg | Freq: Once | INTRAMUSCULAR | Status: AC
  Administered 2014-02-20: 2 mg via INTRAVENOUS

## 2014-02-20 MED ORDER — FENTANYL CITRATE 0.05 MG/ML IJ SOLN
25.0000 ug | INTRAMUSCULAR | Status: DC | PRN
  Administered 2014-02-21: 25 ug via INTRAVENOUS
  Filled 2014-02-20: qty 2

## 2014-02-20 MED ORDER — HEPARIN SODIUM (PORCINE) 5000 UNIT/ML IJ SOLN
5000.0000 [IU] | Freq: Three times a day (TID) | INTRAMUSCULAR | Status: DC
  Administered 2014-02-21 – 2014-03-03 (×34): 5000 [IU] via SUBCUTANEOUS
  Filled 2014-02-20 (×38): qty 1

## 2014-02-20 MED ORDER — ALBUTEROL SULFATE (2.5 MG/3ML) 0.083% IN NEBU: 2.5000 mg | INHALATION_SOLUTION | RESPIRATORY_TRACT | Status: DC | PRN

## 2014-02-20 MED ORDER — SODIUM CHLORIDE 0.9 % IV SOLN
250.0000 mL | INTRAVENOUS | Status: DC | PRN
Start: 2014-02-20 — End: 2014-02-21

## 2014-02-20 MED ORDER — HALOPERIDOL LACTATE 5 MG/ML IJ SOLN
INTRAMUSCULAR | Status: AC
  Filled 2014-02-20: qty 1

## 2014-02-20 MED ORDER — VITAL HIGH PROTEIN PO LIQD
1000.0000 mL | ORAL | Status: DC
  Administered 2014-02-21: 1000 mL
  Filled 2014-02-20 (×2): qty 1000

## 2014-02-20 MED ORDER — SUCCINYLCHOLINE CHLORIDE 20 MG/ML IJ SOLN
INTRAMUSCULAR | Status: AC
  Filled 2014-02-20: qty 1

## 2014-02-20 MED ORDER — ROCURONIUM BROMIDE 50 MG/5ML IV SOLN
INTRAVENOUS | Status: AC
  Filled 2014-02-20: qty 2

## 2014-02-20 MED ORDER — DEXTROSE 5 % IV SOLN: Freq: Once | INTRAVENOUS | Status: DC

## 2014-02-20 MED ORDER — MAGNESIUM SULFATE 40 MG/ML IJ SOLN
2.0000 g | Freq: Once | INTRAMUSCULAR | Status: AC
  Administered 2014-02-20: 2 g via INTRAVENOUS
  Filled 2014-02-20: qty 50

## 2014-02-20 MED ORDER — VANCOMYCIN HCL 10 G IV SOLR
1500.0000 mg | Freq: Once | INTRAVENOUS | Status: DC
  Filled 2014-02-20: qty 1500

## 2014-02-20 MED ORDER — LORAZEPAM 2 MG/ML IJ SOLN
2.0000 mg | Freq: Once | INTRAMUSCULAR | Status: AC
  Administered 2014-02-20: 2 mg via INTRAMUSCULAR
  Filled 2014-02-20: qty 1

## 2014-02-20 MED ORDER — MIDAZOLAM HCL 2 MG/2ML IJ SOLN
2.0000 mg | Freq: Once | INTRAMUSCULAR | Status: AC
  Administered 2014-02-20: 2 mg via INTRAVENOUS

## 2014-02-20 MED ORDER — PROPOFOL 10 MG/ML IV EMUL
INTRAVENOUS | Status: AC
  Administered 2014-02-20 (×2): 40 mg/h
  Administered 2014-02-20: 10 mg
  Administered 2014-02-20: 20 mg/h
  Filled 2014-02-20: qty 100

## 2014-02-20 MED ORDER — ROCURONIUM BROMIDE 50 MG/5ML IV SOLN
50.0000 mg | Freq: Once | INTRAVENOUS | Status: AC
  Administered 2014-02-20: 50 mg via INTRAVENOUS

## 2014-02-20 MED ORDER — SODIUM CHLORIDE 0.9 % IV SOLN
1.5000 g | Freq: Four times a day (QID) | INTRAVENOUS | Status: DC
  Administered 2014-02-21 – 2014-02-24 (×13): 1.5 g via INTRAVENOUS
  Filled 2014-02-20 (×15): qty 1.5

## 2014-02-20 MED ORDER — VANCOMYCIN HCL IN DEXTROSE 1-5 GM/200ML-% IV SOLN
1000.0000 mg | Freq: Once | INTRAVENOUS | Status: AC
  Administered 2014-02-21: 1000 mg via INTRAVENOUS
  Filled 2014-02-20: qty 200

## 2014-02-20 MED ORDER — LORAZEPAM 2 MG/ML IJ SOLN
INTRAMUSCULAR | Status: AC
Start: 2014-02-20 — End: 2014-02-21
  Filled 2014-02-20: qty 1

## 2014-02-20 MED ORDER — LIDOCAINE HCL (CARDIAC) 20 MG/ML IV SOLN
INTRAVENOUS | Status: AC
  Filled 2014-02-20: qty 5

## 2014-02-20 MED ORDER — MIDAZOLAM HCL 2 MG/2ML IJ SOLN
INTRAMUSCULAR | Status: AC
Start: 2014-02-20 — End: 2014-02-21
  Filled 2014-02-20: qty 4

## 2014-02-20 MED ORDER — LORAZEPAM 2 MG/ML IJ SOLN
2.0000 mg | Freq: Once | INTRAMUSCULAR | Status: AC
  Administered 2014-02-20: 2 mg via INTRAMUSCULAR

## 2014-02-20 MED ORDER — LORAZEPAM 2 MG/ML IJ SOLN: 2.0000 mg | Freq: Once | INTRAMUSCULAR | Status: DC

## 2014-02-20 MED ORDER — CHLORHEXIDINE GLUCONATE 0.12 % MT SOLN: 15.0000 mL | Freq: Two times a day (BID) | OROMUCOSAL | Status: DC

## 2014-02-20 NOTE — ED Notes (Signed)
2 point restraints placed on upper extremities.

## 2014-02-20 NOTE — ED Notes (Addendum)
Pt resting/ sedated on vent, breathing with vent, VSS, [facility], new orders received from PCCM, pending bed assignment. HOB raised 30 degrees.

## 2014-02-20 NOTE — H&P (Addendum)
PULMONARY / CRITICAL CARE MEDICINE   Name: [name] MRN: [id number] DOB: 11/05/1968    ADMISSION DATE:  02/20/2014  REFERRING MD :  Dr. Deretha Emory  CHIEF COMPLAINT:  Extreme agitation and low blood sugar  INITIAL PRESENTATION: 02/20/14  STUDIES:  CT head: No acute intracranial abnormality. Mild sinusitis  SIGNIFICANT EVENTS: Intubated 02/20/14   HISTORY OF PRESENT ILLNESS:  This is a critical care admission for [name] who is intubated. Patient unable to give history due to altered mental status, history taken from chart review and conversation with her nurse. This is a patient with known DM on insulin and possible seizure disorder. Apparently today while having dinner, her husband noticed that she was becoming confused and had altered mental status, her husband called EMS who found that her blood sugar was about 22, given glucagon and subsequent fingerstick was around 80-90. Apparently also she had some pieces of chicken in her mouth, unclear whether she had any aspiration episode. In the ED she was awake but very belligerent and confused, despite multiple ativan and high dose haldol, due to extreme interference with care, she was intubated. There was no nuchal rigidity according to the staff members. She did have a fever 101.9 rectally. CXR was so far clear, CT head also unremarkable other than mild sinusitis. There was fair amount of clear secretions out of her ET tube. Urine unremarkable. Mild elevated WBC. Toxicology workup still pending. Of note, she was also in the ED about 4 days ago due to similar reason, where she was also found to have very low bloodsugar, which was treated and patient improved. It is suspected that patient is not using her insulin regimen correctly and not taking enough PO intake at home.  PAST MEDICAL HISTORY :  Past Medical History  Diagnosis Date  . Diabetes mellitus without complication    Past Surgical History  Procedure Laterality Date  . Tracheostomy     . Appendectomy     Prior to Admission medications   Medication Sig Start Date End Date Taking? Authorizing Provider  albuterol (PROVENTIL HFA;VENTOLIN HFA) 108 (90 BASE) MCG/ACT inhaler Inhale 2 puffs into the lungs every 4 (four) hours as needed for wheezing or shortness of breath.    Historical Provider, MD  glucagon 1 MG injection Inject 1 mg into the vein once as needed (for emergent hypoglycemia).    Historical Provider, MD  insulin glargine (LANTUS) 100 UNIT/ML injection Inject 7 Units into the skin 2 (two) times daily.    Historical Provider, MD  latanoprost (XALATAN) 0.005 % ophthalmic solution Place 1 drop into both eyes at bedtime.    Historical Provider, MD   No Known Allergies  FAMILY HISTORY:  History reviewed. No pertinent family history. SOCIAL HISTORY:  reports that she has quit smoking. She does not have any smokeless tobacco history on file. She reports that she does not drink alcohol or use illicit drugs.  REVIEW OF SYSTEMS:  Cannot be obtained due to altered mental status  SUBJECTIVE: cannot be obtained  VITAL SIGNS: Temp:  [101.9 F (38.8 C)] 101.9 F (38.8 C) (08/07 2007) Pulse Rate:  [102-133] 102 (08/07 2300) Resp:  [14-31] 14 (08/07 2300) BP: (96-193)/(63-107) 100/65 mmHg (08/07 2300) SpO2:  [100 %] 100 % (08/07 2300) FiO2 (%):  [40 %-100 %] 40 % (08/07 2223) Weight:  [54.432 kg (120 lb)] 54.432 kg (120 lb) (08/07 2259) HEMODYNAMICS:   VENTILATOR SETTINGS: Vent Mode:  [-] PRVC FiO2 (%):  [40 %-100 %] 40 %  Set Rate:  [14 bmp] 14 bmp Vt Set:  [460 mL] 460 mL PEEP:  [5 cmH20] 5 cmH20 Plateau Pressure:  [18 cmH20] 18 cmH20 INTAKE / OUTPUT: No intake or output data in the 24 hours ending 02/20/14 2310  PHYSICAL EXAMINATION: General:  Intubated, sedated, ET tube in mouth, multiple missing teeth Neuro:  Withdraws all extremities to pain, present cough and gag reflex, pupils equally small and sluggish HEENT:  Atraumatic, no stridor, no enlarged neck  LN Cardiovascular:  RRR, no loud murmur Lungs:  Coarse breath sounds bilaterally Abdomen:  Soft ,no guarding, present bowel sounds Musculoskeletal:  No gross deformities, no edema Skin:  Has multiple scabs and shallow ulcers with mild excoriations on the lower extremities, no frank purulent discharge  LABS:  CBC  Recent Labs Lab 02/16/14 1852 02/20/14 1843  WBC 6.8 11.6*  HGB 11.7* 10.6*  HCT 38.3 34.7*  PLT 157 161   Coag's No results found for this basename: APTT, INR,  in the last 168 hours BMET  Recent Labs Lab 02/16/14 1852 02/20/14 1843  NA 137 139  K 5.2 4.3  CL 100 101  CO2 25 24  BUN 16 18  CREATININE 0.59 0.60  GLUCOSE 147* 146*   Electrolytes  Recent Labs Lab 02/16/14 1852 02/20/14 1843 02/20/14 1933  CALCIUM 8.9 8.2*  --   MG  --   --  1.7   Sepsis Markers  Recent Labs Lab 02/20/14 2026  LATICACIDVEN 1.03   ABG  Recent Labs Lab 02/20/14 2147  PHART 7.478*  PCO2ART 38.5  PO2ART 491.0*   Liver Enzymes  Recent Labs Lab 02/16/14 1852 02/20/14 1843  AST 42* 45*  ALT 13 19  ALKPHOS 48 52  BILITOT 0.2* 0.3  ALBUMIN 3.9 3.4*   Cardiac Enzymes  Recent Labs Lab 02/16/14 1852  TROPONINI <0.30   Glucose  Recent Labs Lab 02/16/14 2301 02/17/14 0004 02/17/14 0128 02/20/14 1838 02/20/14 1855 02/20/14 2254  GLUCAP 55* 78 107* 82 121* 98    Imaging No results found. CXR 02/20/14: The cardiomediastinal silhouette is unremarkable.  An endotracheal tube is noted with tip 1 cm above the carina -consider 1-2 cm retraction.  There is no evidence of focal airspace disease, pulmonary edema, suspicious pulmonary nodule/mass, pleural effusion, or pneumothorax. No acute bony abnormalities are identified  ASSESSMENT / PLAN:  PULMONARY A: intubated due to extreme combativeness, cannot rule out aspiration pneumonia for now P:   Intubated, wean sedation as able, elevate head of bed, oral hygiene, frequent suction Blood culture,  tracheal aspirate culture For now will initiate Unasyn until aspiration PNA is ruled out. Will repeat CXR in AM incase this is a very early PNA that has not shown itself yet. IVF maintenance, monitor I/O  CARDIOVASCULAR A: Transient tachycardia likely from stress P:  monitor  RENAL A:  No acute issue P:   IVF maintenance, monitor I/O, avoid nephrotoxic meds  GASTROINTESTINAL A:  No acute issue P:   Start tube feed  HEMATOLOGIC A:  Mildly elevated WBC, chronic anemia P:  monitor  INFECTIOUS A:  Possible asp PNA. Other less likely possible source of infection include the skin (scabs and ulcers on the legs), sinusitis (although only mild). Unlikely meningitis but if patient's mental status does not improve in a day or two, may need LP to rule this out. P:   BCx2 pending UC pending Sputum pending Abx:  Zosyn x 1 dose in ED 8/7 Vanco x 1 dose in ED 8/7 Unasyn  8/7 >>>  ENDOCRINE A:  Low blood sugar, known DM on insulin P:   Unclear reason why blood sugar was low on multiple occasions, may just be from incorrect use of her insulin regimen and poor PO intake Possible sepsis may also contribute Will need to start on tube feed and closely monitor blood sugar with frequent fingerstick checks  NEUROLOGIC A:  Confusion of unclear etiology: can be from the extremely low blood sugar, ??sepsis, ??illicit drugs, less likely seizure P:   For now will correct blood sugar, workup for toxicology, treat possible sepsis, wean her sedation If mental status not better in a day or two, will consider LP and EEG. RASS goal: 0-2  TODAY'S SUMMARY: brought to ED for altered mental status/confusion, very low blood sugar, still very combative after blood sugar correction, intubated. Has fever. So far workups are nonrevealing other than sinusitis, possible asp PNA, and some skin scabs + shallow ulcers on the legs.  I have personally obtained a history, examined the patient, evaluated laboratory and  imaging results, formulated the assessment and plan and placed orders. CRITICAL CARE: The patient is critically ill with multiple organ systems failure and requires high complexity decision making for assessment and support, frequent evaluation and titration of therapies, application of advanced monitoring technologies and extensive interpretation of multiple databases. Critical Care Time devoted to patient care services described in this note is 45 minutes.    [facility]ulmonary and Critical Care Medicine [facility] Pager: [phone]  02/20/2014, 11:10 PM

## 2014-02-20 NOTE — ED Notes (Signed)
Restraints removed at 2030.

## 2014-02-20 NOTE — ED Notes (Signed)
Pt intubated with 6.5 ETT 20 at the lip.  Good color change, rise and fall of chest, bilateral breath sounds.

## 2014-02-20 NOTE — ED Provider Notes (Signed)
I saw and evaluated the patient, reviewed the resident's note and I agree with the findings and plan.   EKG Interpretation None      Results for orders placed during the hospital encounter of 02/20/14  COMPREHENSIVE METABOLIC PANEL      Result Value Ref Range   Sodium 139  137 - 147 mEq/L   Potassium 4.3  3.7 - 5.3 mEq/L   Chloride 101  96 - 112 mEq/L   CO2 24  19 - 32 mEq/L   Glucose, Bld 146 (*) 70 - 99 mg/dL   BUN 18  6 - 23 mg/dL   Creatinine, Ser 1.61  0.50 - 1.10 mg/dL   Calcium 8.2 (*) 8.4 - 10.5 mg/dL   Total Protein 7.5  6.0 - 8.3 g/dL   Albumin 3.4 (*) 3.5 - 5.2 g/dL   AST 45 (*) 0 - 37 U/L   ALT 19  0 - 35 U/L   Alkaline Phosphatase 52  39 - 117 U/L   Total Bilirubin 0.3  0.3 - 1.2 mg/dL   GFR calc non Af Amer >90  >90 mL/min   GFR calc Af Amer >90  >90 mL/min   Anion gap 14  5 - 15  CBC WITH DIFFERENTIAL      Result Value Ref Range   WBC 11.6 (*) 4.0 - 10.5 K/uL   RBC 5.22 (*) 3.87 - 5.11 MIL/uL   Hemoglobin 10.6 (*) 12.0 - 15.0 g/dL   HCT 09.6 (*) 04.5 - 40.9 %   MCV 66.5 (*) 78.0 - 100.0 fL   MCH 20.3 (*) 26.0 - 34.0 pg   MCHC 30.5  30.0 - 36.0 g/dL   RDW 81.1  91.4 - 78.2 %   Platelets 161  150 - 400 K/uL   Neutrophils Relative % 87 (*) 43 - 77 %   Lymphocytes Relative 6 (*) 12 - 46 %   Monocytes Relative 7  3 - 12 %   Eosinophils Relative 0  0 - 5 %   Basophils Relative 0  0 - 1 %   Neutro Abs 10.1 (*) 1.7 - 7.7 K/uL   Lymphs Abs 0.7  0.7 - 4.0 K/uL   Monocytes Absolute 0.8  0.1 - 1.0 K/uL   Eosinophils Absolute 0.0  0.0 - 0.7 K/uL   Basophils Absolute 0.0  0.0 - 0.1 K/uL   RBC Morphology TARGET CELLS    URINALYSIS, ROUTINE W REFLEX MICROSCOPIC      Result Value Ref Range   Color, Urine YELLOW  YELLOW   APPearance CLEAR  CLEAR   Specific Gravity, Urine 1.023  1.005 - 1.030   pH 6.0  5.0 - 8.0   Glucose, UA 100 (*) NEGATIVE mg/dL   Hgb urine dipstick NEGATIVE  NEGATIVE   Bilirubin Urine NEGATIVE  NEGATIVE   Ketones, ur 40 (*) NEGATIVE mg/dL   Protein, ur 30 (*) NEGATIVE mg/dL   Urobilinogen, UA 0.2  0.0 - 1.0 mg/dL   Nitrite NEGATIVE  NEGATIVE   Leukocytes, UA NEGATIVE  NEGATIVE  MAGNESIUM      Result Value Ref Range   Magnesium 1.7  1.5 - 2.5 mg/dL  URINE MICROSCOPIC-ADD ON      Result Value Ref Range   Squamous Epithelial / LPF FEW (*) RARE   WBC, UA 0-2  <3 WBC/hpf   Bacteria, UA RARE  RARE   Urine-Other MUCOUS PRESENT    AMMONIA      Result Value Ref Range  Ammonia 19  11 - 60 umol/L  CBG MONITORING, ED      Result Value Ref Range   Glucose-Capillary 82  70 - 99 mg/dL  CBG MONITORING, ED      Result Value Ref Range   Glucose-Capillary 121 (*) 70 - 99 mg/dL  I-STAT CG4 LACTIC ACID, ED      Result Value Ref Range   Lactic Acid, Venous 1.03  0.5 - 2.2 mmol/L  I-STAT ARTERIAL BLOOD GAS, ED      Result Value Ref Range   pH, Arterial 7.478 (*) 7.350 - 7.450   pCO2 arterial 38.5  35.0 - 45.0 mmHg   pO2, Arterial 491.0 (*) 80.0 - 100.0 mmHg   Bicarbonate 28.5 (*) 20.0 - 24.0 mEq/L   TCO2 30  0 - 100 mmol/L   O2 Saturation 100.0     Acid-Base Excess 5.0 (*) 0.0 - 2.0 mmol/L   Collection site RADIAL, ALLEN'S TEST ACCEPTABLE     Drawn by Operator     Sample type ARTERIAL     Dg Neck Soft Tissue  02/16/2014   CLINICAL DATA:  Hypoglycemia.  Neck pain  EXAM: NECK SOFT TISSUES - 1+ VIEW  COMPARISON:  None.  FINDINGS: There is no evidence of retropharyngeal soft tissue swelling or epiglottic enlargement. The cervical airway is unremarkable and no radio-opaque foreign body identified.  IMPRESSION: Negative.   Electronically Signed   By: Marlan Palau M.D.   On: 02/16/2014 20:23   Dg Chest 2 View  02/16/2014   CLINICAL DATA:  Hypoglycemia.  Fall.  EXAM: CHEST  2 VIEW  COMPARISON:  None.  FINDINGS: The heart size and mediastinal contours are within normal limits. Both lungs are clear. The visualized skeletal structures are unremarkable.  IMPRESSION: No active cardiopulmonary disease.   Electronically Signed   By: Marlan Palau  M.D.   On: 02/16/2014 20:23   Ct Head Wo Contrast  02/20/2014   CLINICAL DATA:  Altered mental status, hypoglycemia  EXAM: CT HEAD WITHOUT CONTRAST  TECHNIQUE: Contiguous axial images were obtained from the base of the skull through the vertex without intravenous contrast.  COMPARISON:  02/16/2014  FINDINGS: No mass lesion. No midline shift. No acute hemorrhage or hematoma. No extra-axial fluid collections. No evidence of acute infarction. Calvarium is intact. There is scattered bilateral ethmoid air cell opacification. There is a small left maxillary sinus mucous retention cyst.  IMPRESSION: No acute intracranial abnormality.  Mild sinusitis.   Electronically Signed   By: Esperanza Heir M.D.   On: 02/20/2014 22:44   Ct Head Wo Contrast  02/16/2014   CLINICAL DATA:  Syncope.  EXAM: CT HEAD WITHOUT CONTRAST  TECHNIQUE: Contiguous axial images were obtained from the base of the skull through the vertex without intravenous contrast.  COMPARISON:  None.  FINDINGS: Skull and Sinuses:Negative for fracture or destructive process. The mastoids, middle ears, and imaged paranasal sinuses are clear.  Orbits: Probable bilateralcataract resection.  Brain: No evidence of acute abnormality, such as acute infarction, hemorrhage, hydrocephalus, or mass lesion/mass effect. Low cerebral volume for age.  IMPRESSION: No acute intracranial abnormality.   Electronically Signed   By: Tiburcio Pea M.D.   On: 02/16/2014 21:24   Dg Chest Portable 1 View  02/20/2014   CLINICAL DATA:  48 year old female with altered mental status -intubated.  EXAM: PORTABLE CHEST - 1 VIEW  COMPARISON:  02/16/2014 chest radiograph  FINDINGS: The cardiomediastinal silhouette is unremarkable.  An endotracheal tube is noted with tip 1  cm above the carina -consider 1-2 cm retraction.  There is no evidence of focal airspace disease, pulmonary edema, suspicious pulmonary nodule/mass, pleural effusion, or pneumothorax. No acute bony abnormalities are  identified.  IMPRESSION: Endotracheal tube with tip 1 cm above the carina -consider 1-2 cm retraction.  No acute cardiopulmonary abnormality.   Electronically Signed   By: Laveda AbbeJeff  Hu M.D.   On: 02/20/2014 20:50    CRITICAL CARE Performed by: Vanetta MuldersZACKOWSKI,Kaeleen Odom Total critical care time: 45  Critical care time was exclusive of separately billable procedures and treating other patients. Critical care was necessary to treat or prevent imminent or life-threatening deterioration. Critical care was time spent personally by me on the following activities: development of treatment plan with patient and/or surrogate as well as nursing, discussions with consultants, evaluation of patient's response to treatment, examination of patient, obtaining history from patient or surrogate, ordering and performing treatments and interventions, ordering and review of laboratory studies, ordering and review of radiographic studies, pulse oximetry and re-evaluation of patient's condition.   Patient seen was present during all the procedures done by the resident. Patient originally brought in for a low blood sugar. Patient was given glucagon at the scene. That brought patient's blood sugar up from 21-91. However patient remained confused. Almost as if she was postictal period this last a long period of time. Patient remained hypertensive and tachycardic. Very difficult to get an IV started. We did an external jugular which was oppositional. Patient was not holding still and off despite some sedation to get head CT and further workup. So patient underwent rapid sequence intubation and received Versed and Rocuronium. Following this patient had head CT done patient does continue a fluid bolus. Head CT did not show any sniffing findings chest x-ray did not show evidence of pneumonia. Urinalysis was not consistent with a source of infection. We presume patient was probably infected in some location but unable to identify. However patient  like gas it also was not elevated.  Discuss with pulmonary critical care they will admit.  Vanetta MuldersScott Larenz Frasier, MD 02/20/14 2253

## 2014-02-20 NOTE — ED Notes (Signed)
Resident notified of pt still continuing to be combative in room with RN and EMT. 2mg  of Ativan IM reordered. Still unable to obtain an IV due to pt continuing to be combative.

## 2014-02-20 NOTE — ED Notes (Signed)
Decision made to intubate pt due to pt continuing to be altered and combative with staff after 6mg  of Ativan and 10 of Haldol.

## 2014-02-20 NOTE — ED Provider Notes (Signed)
CSN: [id number]     Arrival date & time 02/20/14  1831 History   First MD Initiated Contact with Patient 02/20/14 1837     Chief Complaint  Patient presents with  . Altered Mental Status  . Hypoglycemia     (Consider location/radiation/quality/duration/timing/severity/associated sxs/prior Treatment) HPI  [name] is a 48 y.o. female  Patient presenting with altered mental status. Patient has a history of diabetes treated with insulin. Per her significant other she was acting like normal self earlier today and then prior to arrival was eating some chicken and suddenly lost consciousness and was unarousable. He contacted EMS, and upon arrival they noted that her blood sugar was 21. They were unable to obtain IV access and administer glucagon.  Upon arrival to the emergency department the patient was noted to have a glucose of 94 however continued to remained altered. Patient was combative, and was able to speak and was alert but was not oriented. Patient was moving all extremities. Patient was repeatedly shouting "no stop, no, I got to go." The man who accompanied patient states that she had been acting like herself earlier today, she was not complaining of any shortness of breath, any chest pain, fever, chills, nausea, vomiting, abdominal pain, and had not had any other recent illnesses as far as he knows. Patient was recently seen emergency department for hypoglycemia, and has associated confusion but this resolved while she was here. As far as he knows she is taking her medication as prescribed, and has had normal oral intake of food and liquids.  No known history of drug use by ED staff familiar with the patient.      Past Medical History  Diagnosis Date  . Diabetes mellitus without complication    Past Surgical History  Procedure Laterality Date  . Tracheostomy    . Appendectomy     History reviewed. No pertinent family history. History  Substance Use Topics  . Smoking status:  Former Games developermoker  . Smokeless tobacco: Not on file  . Alcohol Use: No   OB History   Grav Para Term Preterm Abortions TAB SAB Ect Mult Living                 Review of Systems  Unable to perform ROS: Mental status change      Allergies  Review of patient's allergies indicates no known allergies.  Home Medications   Prior to Admission medications   Medication Sig Start Date End Date Taking? Authorizing Provider  albuterol (PROVENTIL HFA;VENTOLIN HFA) 108 (90 BASE) MCG/ACT inhaler Inhale 2 puffs into the lungs every 4 (four) hours as needed for wheezing or shortness of breath.    Historical Provider, MD  glucagon 1 MG injection Inject 1 mg into the vein once as needed (for emergent hypoglycemia).    Historical Provider, MD  insulin glargine (LANTUS) 100 UNIT/ML injection Inject 7 Units into the skin 2 (two) times daily.    Historical Provider, MD  latanoprost (XALATAN) 0.005 % ophthalmic solution Place 1 drop into both eyes at bedtime.    Historical Provider, MD   BP 172/93  Pulse 118  Temp(Src) 101.9 F (38.8 C) (Rectal)  Resp 21  Wt 120 lb (54.432 kg)  SpO2 100%  LMP 07/23/2015Physical Exam  Nursing note and vitals reviewed. Constitutional: She appears well-developed and well-nourished. She appears distressed.  HENT:  Head: Normocephalic and atraumatic.  Right Ear: External ear normal.  Left Ear: External ear normal.  Nose: Nose normal.  Mouth/Throat: Oropharynx  is clear and moist. No oropharyngeal exudate.  Eyes: Conjunctivae and EOM are normal. Pupils are equal, round, and reactive to light. Right eye exhibits no discharge. Left eye exhibits no discharge. No scleral icterus.  Neck: Normal range of motion. Neck supple. No JVD present. No tracheal deviation present. No thyromegaly present.  Cardiovascular: Regular rhythm, normal heart sounds and intact distal pulses.  Tachycardia present.  Exam reveals no gallop and no friction rub.   No murmur heard. Pulmonary/Chest:  Effort normal. Stridor present. No respiratory distress. She has no decreased breath sounds. She has no wheezes. She has no rhonchi. She has no rales. She exhibits no tenderness.  Transmitted upper airway sounds.  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  Abdominal: Soft. She exhibits no distension.  Musculoskeletal: Normal range of motion. She exhibits no edema and no tenderness.  Lymphadenopathy:    She has no cervical adenopathy.  Neurological: She has normal strength. She is disoriented. She displays atrophy. She displays no tremor. No cranial nerve deficit. She exhibits normal muscle tone. She displays no seizure activity. GCS eye subscore is 3. GCS verbal subscore is 3. GCS motor subscore is 5.  Neuro exam limited by patient's combativeness.  Moving all extremities.  Skin: Skin is warm and dry. Rash noted. She is not diaphoretic. No erythema. No pallor.  Chronic appearing skin wounds/scabs.  Psychiatric: Her speech is tangential. She is combative. Thought content is delusional.    ED Course  INTUBATION Date/Time: 02/20/2014 8:30 PM Performed by: Gavin Pound Authorized by: Gavin Pound Consent: The procedure was performed in an emergent situation. Patient identity confirmed: arm band Time out: Immediately prior to procedure a "time out" was called to verify the correct patient, procedure, equipment, support staff and site/side marked as required. Indications: airway protection (Combativeness, Critically ill, Unable to Perform Evaluation and Interventions) Intubation method: direct Patient status: paralyzed (RSI) Preoxygenation: BVM Sedatives: midazolam Paralytic: rocuronium Laryngoscope size: Miller 2 Tube size: 6.5 mm Tube type: cuffed Number of attempts: 2 Ventilation between attempts: BVM Cricoid pressure: no Cords visualized: yes (Grade 2 view) Post-procedure assessment: chest rise and ETCO2 monitor Breath sounds: equal Cuff inflated: yes ETT to lip: 20  cm Tube secured with: ETT holder Chest x-ray interpreted by other physician. Chest x-ray findings: endotracheal tube too low Tube repositioned: tube repositioned successfully Patient tolerance: Patient tolerated the procedure well with no immediate complications. Comments: Bougie visually passed and digitally confirmed prior to ETT placement.   ULTRASOUND GUIDED IV 20 gauge ultrasound guided IV placed in L brachial vein, due to poor vascular access, and multiple superficial IV attempts.  Return of dark red blood obtained.  Fluid infusion visualized without infiltration.   (including critical care time) Labs Review Labs Reviewed  COMPREHENSIVE METABOLIC PANEL - Abnormal; Notable for the following:    Glucose, Bld 146 (*)    Calcium 8.2 (*)    Albumin 3.4 (*)    AST 45 (*)    All other components within normal limits  CBC WITH DIFFERENTIAL - Abnormal; Notable for the following:    WBC 11.6 (*)    RBC 5.22 (*)    Hemoglobin 10.6 (*)    HCT 34.7 (*)    MCV 66.5 (*)    MCH 20.3 (*)    Neutrophils Relative % 87 (*)    Lymphocytes Relative 6 (*)    Neutro Abs 10.1 (*)    All other components within normal limits  URINALYSIS, ROUTINE W REFLEX MICROSCOPIC - Abnormal;  Notable for the following:    Glucose, UA 100 (*)    Ketones, ur 40 (*)    Protein, ur 30 (*)    All other components within normal limits  URINE RAPID DRUG SCREEN (HOSP PERFORMED) - Abnormal; Notable for the following:    Benzodiazepines POSITIVE (*)    All other components within normal limits  URINE MICROSCOPIC-ADD ON - Abnormal; Notable for the following:    Squamous Epithelial / LPF FEW (*)    All other components within normal limits  SALICYLATE LEVEL - Abnormal; Notable for the following:    Salicylate Lvl <2.0 (*)    All other components within normal limits  CBG MONITORING, ED - Abnormal; Notable for the following:    Glucose-Capillary 121 (*)    All other components within normal limits  I-STAT ARTERIAL  BLOOD GAS, ED - Abnormal; Notable for the following:    pH, Arterial 7.478 (*)    pO2, Arterial 491.0 (*)    Bicarbonate 28.5 (*)    Acid-Base Excess 5.0 (*)    All other components within normal limits  CULTURE, BLOOD (ROUTINE X 2)  CULTURE, BLOOD (ROUTINE X 2)  CULTURE, RESPIRATORY (NON-EXPECTORATED)  MAGNESIUM  AMMONIA  ETHANOL  ACETAMINOPHEN LEVEL  BLOOD GAS, ARTERIAL  CBG MONITORING, ED  I-STAT CG4 LACTIC ACID, ED  CBG MONITORING, ED    Imaging Review Ct Head Wo Contrast  02/20/2014   CLINICAL DATA:  Altered mental status, hypoglycemia  EXAM: CT HEAD WITHOUT CONTRAST  TECHNIQUE: Contiguous axial images were obtained from the base of the skull through the vertex without intravenous contrast.  COMPARISON:  02/16/2014  FINDINGS: No mass lesion. No midline shift. No acute hemorrhage or hematoma. No extra-axial fluid collections. No evidence of acute infarction. Calvarium is intact. There is scattered bilateral ethmoid air cell opacification. There is a small left maxillary sinus mucous retention cyst.  IMPRESSION: No acute intracranial abnormality.  Mild sinusitis.   Electronically Signed   By: Esperanza Heir M.D.   On: 02/20/2014 22:44   Dg Chest Portable 1 View  02/20/2014   CLINICAL DATA:  48 year old female with altered mental status -intubated.  EXAM: PORTABLE CHEST - 1 VIEW  COMPARISON:  02/16/2014 chest radiograph  FINDINGS: The cardiomediastinal silhouette is unremarkable.  An endotracheal tube is noted with tip 1 cm above the carina -consider 1-2 cm retraction.  There is no evidence of focal airspace disease, pulmonary edema, suspicious pulmonary nodule/mass, pleural effusion, or pneumothorax. No acute bony abnormalities are identified.  IMPRESSION: Endotracheal tube with tip 1 cm above the carina -consider 1-2 cm retraction.  No acute cardiopulmonary abnormality.   Electronically Signed   By: Laveda Abbe M.D.   On: 02/20/2014 20:50     EKG Interpretation   Date/Time:  Friday  February 20 2014 19:29:10 EDT Ventricular Rate:  128 PR Interval:  153 QRS Duration: 71 QT Interval:  433 QTC Calculation: 632 R Axis:   37 Text Interpretation:  Sinus tachycardia Consider right atrial enlargement  Abnormal R-wave progression, early transition Prolonged QT interval No  significant change since last tracing Confirmed by ZACKOWSKI  MD, SCOTT  ([id number]) on 02/20/2014 10:54:01 PM      MDM   Final diagnoses:  Altered mental status, unspecified altered mental status type  Hypoglycemia   Upon arrival to the emergency department patient was unable to cooperate with medical staff trying to intervene and appeared critically ill. Multiple doses of intramuscular Ativan were administered intramuscularly, as well as Haldol.  Patient received a total of 6 mg of Ativan, 4 mg intramuscularly, and 2 mg intravenously after external jugular vein IV placed. She received 10 mg of Haldol intramuscularly.  EKG shows significantly prolonged QTC. Patient did receive 10 mg of Haldol however this is unlikely to account for her QTC prolongation, suspect ingestion or metabolic abnormalities. 2 g of magnesium given for arrhythmia prophylaxis.  Will hold additional QTC prolonging medications. Upon later review of records note EKG on 02/17/2014 showed a QTC of 473 by comparison.     Due to patient's acute agitation and need for further workup decision was made to intubate with RSI to allow for additional medical interventions and workup.  Patient was given 2 mg of Versed for induction, and 50 mg of rocuronium. 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  7.5 ETT was unable to be placed due to stenosis, and 6.5 ETT was successfully passed on a second attempt, with moderate resistance.  Patient was noted to have a temp of 101.9. Broad-spectrum antibiotic initiated due to concern for sepsis  patient had workup including CT of the head, chest x-ray, labs, urine studies, drugs of abuse, ammonia.  ABG shows a metabolic alkalosis UA significant for ketones but negative for signs of infection, ammonia is within normal limits, glucose within normal limits, no drugs noted on UDS other than benzos with patient received earlier, lactic acid not elevated, negative salicylates, negative ethanol negative Tylenol, no significant abnormalities on CMP other than mildly elevated AST, CBC shows slightly elevated white count, nonspecific, CT head shows no acute abnormalities, chest x-ray shows no acute abnormalities.    Critical care consulted, at this time patient still has undifferentiated altered mental status possibly secondary to infection, common drugs of ingestion ruled out.  Consideration for meningitis, however, appears less likely based on patient's presentation primarily of agitation and combativeness, also patient was actively moving head and neck in resistance to interventions prior to RSI. Inpatient team will address if necessary.  Possible early pulmonary source, urinary tract infection unlikely following studies. Patient will be admitted for further management.  Patient care was discussed with my attending, Dr. Deretha Emory.    Gavin Pound, MD 02/21/14 [phone]

## 2014-02-20 NOTE — ED Notes (Signed)
Per lab, ammonia is hemolyzed.  Will need to be redrawn.

## 2014-02-20 NOTE — ED Notes (Signed)
Pt sedated with 2 mg of Versed and 50 mg of Rocuronium.

## 2014-02-20 NOTE — ED Notes (Signed)
Pt from home with episode of hypoglycemia and altered mental status.  CBG 21 with EMS.  After 1 of glucagon, pt's CBG 91 PTA.  On arrival CBG 82.  Pt combative on arrival to room.  Unable to redirect or reorient pt to location.  Resident and EDP at bedside.

## 2014-02-21 ENCOUNTER — Inpatient Hospital Stay (HOSPITAL_COMMUNITY): Payer: Medicaid Other

## 2014-02-21 DIAGNOSIS — J9600 Acute respiratory failure, unspecified whether with hypoxia or hypercapnia: Secondary | ICD-10-CM

## 2014-02-21 DIAGNOSIS — E162 Hypoglycemia, unspecified: Secondary | ICD-10-CM

## 2014-02-21 DIAGNOSIS — J9601 Acute respiratory failure with hypoxia: Secondary | ICD-10-CM

## 2014-02-21 DIAGNOSIS — R4182 Altered mental status, unspecified: Secondary | ICD-10-CM

## 2014-02-21 DIAGNOSIS — R404 Transient alteration of awareness: Secondary | ICD-10-CM

## 2014-02-21 LAB — POCT I-STAT 3, ART BLOOD GAS (G3+)
ACID-BASE EXCESS: 3 mmol/L — AB (ref 0.0–2.0)
BICARBONATE: 25.8 meq/L — AB (ref 20.0–24.0)
Bicarbonate: 23 mEq/L (ref 20.0–24.0)
O2 SAT: 99 %
O2 SAT: 98 %
PCO2 ART: 35.1 mmHg (ref 35.0–45.0)
Patient temperature: 99.7
TCO2: 27 mmol/L (ref 0–100)
TCO2: 24 mmol/L (ref 0–100)
pCO2 arterial: 33.9 mmHg — ABNORMAL LOW (ref 35.0–45.0)
pH, Arterial: 7.476 — ABNORMAL HIGH (ref 7.350–7.450)
pH, Arterial: 7.443 (ref 7.350–7.450)
pO2, Arterial: 151 mmHg — ABNORMAL HIGH (ref 80.0–100.0)
pO2, Arterial: 110 mmHg — ABNORMAL HIGH (ref 80.0–100.0)

## 2014-02-21 LAB — COMPREHENSIVE METABOLIC PANEL
ALT: 19 U/L (ref 0–35)
ANION GAP: 15 (ref 5–15)
AST: 37 U/L (ref 0–37)
Albumin: 3.4 g/dL — ABNORMAL LOW (ref 3.5–5.2)
Alkaline Phosphatase: 54 U/L (ref 39–117)
BUN: 15 mg/dL (ref 6–23)
CO2: 24 mEq/L (ref 19–32)
Calcium: 8.2 mg/dL — ABNORMAL LOW (ref 8.4–10.5)
Chloride: 98 mEq/L (ref 96–112)
Creatinine, Ser: 0.71 mg/dL (ref 0.50–1.10)
GFR calc non Af Amer: >90 mL/min (ref >90)
GLUCOSE: 197 mg/dL — AB (ref 70–99)
POTASSIUM: 3.1 meq/L — AB (ref 3.7–5.3)
SODIUM: 137 meq/L (ref 137–147)
TOTAL PROTEIN: 7.6 g/dL (ref 6.0–8.3)
Total Bilirubin: 0.5 mg/dL (ref 0.3–1.2)

## 2014-02-21 LAB — CBG MONITORING, ED: Glucose-Capillary: 58 mg/dL — ABNORMAL LOW (ref 70–99)

## 2014-02-21 LAB — CBC
HCT: 35.8 % — ABNORMAL LOW (ref 36.0–46.0)
Hemoglobin: 11.2 g/dL — ABNORMAL LOW (ref 12.0–15.0)
MCH: 20.5 pg — AB (ref 26.0–34.0)
MCHC: 31.3 g/dL (ref 30.0–36.0)
MCV: 65.4 fL — ABNORMAL LOW (ref 78.0–100.0)
PLATELETS: 151 10*3/uL (ref 150–400)
RBC: 5.47 MIL/uL — AB (ref 3.87–5.11)
RDW: 15.2 % (ref 11.5–15.5)
WBC: 6.6 10*3/uL (ref 4.0–10.5)

## 2014-02-21 LAB — PHOSPHORUS: Phosphorus: 2.8 mg/dL (ref 2.3–4.6)

## 2014-02-21 LAB — MRSA PCR SCREENING: MRSA by PCR: NEGATIVE

## 2014-02-21 LAB — GLUCOSE, CAPILLARY
Glucose-Capillary: 188 mg/dL — ABNORMAL HIGH (ref 70–99)
Glucose-Capillary: 212 mg/dL — ABNORMAL HIGH (ref 70–99)
Glucose-Capillary: 197 mg/dL — ABNORMAL HIGH (ref 70–99)
Glucose-Capillary: 220 mg/dL — ABNORMAL HIGH (ref 70–99)
Glucose-Capillary: 163 mg/dL — ABNORMAL HIGH (ref 70–99)
Glucose-Capillary: 161 mg/dL — ABNORMAL HIGH (ref 70–99)

## 2014-02-21 LAB — MAGNESIUM: Magnesium: 2.7 mg/dL — ABNORMAL HIGH (ref 1.5–2.5)

## 2014-02-21 LAB — PRO B NATRIURETIC PEPTIDE: Pro B Natriuretic peptide (BNP): 81.3 pg/mL (ref 0–125)

## 2014-02-21 MED ORDER — INSULIN ASPART 100 UNIT/ML ~~LOC~~ SOLN
0.0000 [IU] | Freq: Three times a day (TID) | SUBCUTANEOUS | Status: DC
  Administered 2014-02-21: 5 [IU] via SUBCUTANEOUS
  Administered 2014-02-21: 3 [IU] via SUBCUTANEOUS

## 2014-02-21 MED ORDER — SODIUM CHLORIDE 0.9 % IV SOLN
250.0000 mL | INTRAVENOUS | Status: DC | PRN
  Administered 2014-02-21: 250 mL via INTRAVENOUS

## 2014-02-21 MED ORDER — FENTANYL CITRATE 0.05 MG/ML IJ SOLN: 25.0000 ug | INTRAMUSCULAR | Status: DC | PRN

## 2014-02-21 MED ORDER — POTASSIUM CHLORIDE 20 MEQ/15ML (10%) PO LIQD
30.0000 meq | ORAL | Status: AC
  Administered 2014-02-21 (×2): 30 meq
  Filled 2014-02-21 (×2): qty 30

## 2014-02-21 MED ORDER — ACETAMINOPHEN 160 MG/5ML PO SOLN
650.0000 mg | Freq: Four times a day (QID) | ORAL | Status: DC | PRN
  Administered 2014-02-21: 650 mg via ORAL
  Filled 2014-02-21: qty 20.3

## 2014-02-21 MED ORDER — PROPOFOL 10 MG/ML IV EMUL
5.0000 ug/kg/min | INTRAVENOUS | Status: DC
  Administered 2014-02-21: 30 ug/kg/min via INTRAVENOUS

## 2014-02-21 MED ORDER — VITAL HIGH PROTEIN PO LIQD
1000.0000 mL | ORAL | Status: DC
  Administered 2014-02-21 – 2014-02-23 (×3): 1000 mL
  Filled 2014-02-21 (×3): qty 1000

## 2014-02-21 MED ORDER — PROPOFOL 10 MG/ML IV EMUL
INTRAVENOUS | Status: AC
  Filled 2014-02-21: qty 100

## 2014-02-21 MED ORDER — SODIUM CHLORIDE 0.9 % IV SOLN
250.0000 mL | INTRAVENOUS | Status: DC | PRN
Start: 2014-02-21 — End: 2014-03-04
  Administered 2014-03-01: 10 mL via INTRAVENOUS

## 2014-02-21 MED ORDER — CETYLPYRIDINIUM CHLORIDE 0.05 % MT LIQD
7.0000 mL | Freq: Four times a day (QID) | OROMUCOSAL | Status: DC
  Administered 2014-02-21 – 2014-03-02 (×37): 7 mL via OROMUCOSAL

## 2014-02-21 MED ORDER — FENTANYL CITRATE 0.05 MG/ML IJ SOLN
25.0000 ug | INTRAMUSCULAR | Status: DC | PRN
  Administered 2014-02-22 – 2014-03-02 (×9): 100 ug via INTRAVENOUS
  Filled 2014-02-21 (×9): qty 2

## 2014-02-21 MED ORDER — DEXTROSE 50 % IV SOLN
12.5000 g | Freq: Once | INTRAVENOUS | Status: AC
  Administered 2014-02-21: 12.5 g via INTRAVENOUS
  Filled 2014-02-21: qty 50

## 2014-02-21 MED ORDER — CHLORHEXIDINE GLUCONATE 0.12 % MT SOLN
15.0000 mL | Freq: Two times a day (BID) | OROMUCOSAL | Status: DC
  Administered 2014-02-21 – 2014-03-02 (×19): 15 mL via OROMUCOSAL
  Filled 2014-02-21 (×17): qty 15

## 2014-02-21 MED ORDER — INSULIN ASPART 100 UNIT/ML ~~LOC~~ SOLN
0.0000 [IU] | SUBCUTANEOUS | Status: DC
  Administered 2014-02-21 – 2014-02-22 (×2): 3 [IU] via SUBCUTANEOUS
  Administered 2014-02-22: 5 [IU] via SUBCUTANEOUS
  Administered 2014-02-22 (×2): 3 [IU] via SUBCUTANEOUS
  Administered 2014-02-22: 5 [IU] via SUBCUTANEOUS
  Administered 2014-02-22 – 2014-02-23 (×2): 8 [IU] via SUBCUTANEOUS
  Administered 2014-02-23: 3 [IU] via SUBCUTANEOUS
  Administered 2014-02-23: 8 [IU] via SUBCUTANEOUS
  Administered 2014-02-23: 3 [IU] via SUBCUTANEOUS
  Administered 2014-02-23: 8 [IU] via SUBCUTANEOUS

## 2014-02-21 MED ORDER — PROPOFOL 10 MG/ML IV EMUL
5.0000 ug/kg/min | INTRAVENOUS | Status: DC
  Administered 2014-02-21: 10 ug/kg/min via INTRAVENOUS
  Administered 2014-02-21: 50 ug/kg/min via INTRAVENOUS
  Administered 2014-02-22: 35 ug/kg/min via INTRAVENOUS
  Administered 2014-02-22: 50 ug/kg/min via INTRAVENOUS
  Administered 2014-02-22: 30 ug/kg/min via INTRAVENOUS
  Administered 2014-02-23: 54.972 ug/kg/min via INTRAVENOUS
  Administered 2014-02-23 (×2): 55 ug/kg/min via INTRAVENOUS
  Administered 2014-02-24: 60 ug/kg/min via INTRAVENOUS
  Administered 2014-02-24: 55 ug/kg/min via INTRAVENOUS
  Filled 2014-02-21 (×10): qty 100

## 2014-02-21 MED ORDER — FAMOTIDINE 20 MG PO TABS
20.0000 mg | ORAL_TABLET | Freq: Every day | ORAL | Status: DC
  Administered 2014-02-21 – 2014-02-25 (×5): 20 mg via ORAL
  Filled 2014-02-21 (×6): qty 1

## 2014-02-21 MED ORDER — LORAZEPAM 2 MG/ML IJ SOLN
INTRAMUSCULAR | Status: AC
  Administered 2014-02-21: 2 mg
  Filled 2014-02-21: qty 1

## 2014-02-21 NOTE — Procedures (Signed)
Intubation Procedure Note Nicole MiloLenora [name] [id number] 11/05/1968  Procedure: Intubation Indications: Respiratory insufficiency  Procedure Details Consent: Unable to obtain consent because of emergent medical necessity. Time Out: Verified patient identification, verified procedure, site/side was marked, verified correct patient position, special equipment/implants available, medications/allergies/relevent history reviewed, required imaging and test results available.  Performed  MAC and 3 Medications:  Fentanyl  Etomidate Versed NMB  Diprivan 50 mg iv x 1.   Evaluation Hemodynamic Status: BP stable throughout; O2 sats: stable throughout Patient's Current Condition: stable Complications: No apparent complications Patient did tolerate procedure well. Chest X-ray ordered to verify placement.  CXR: pending.   Brett CanalesSteve Minor ACNP [facility] Pager [phone] till 3 pm If no answer page [phone] 02/21/2014, 1:03 PM   I was present for procedure.  Coralyn HellingVineet [name], MD [facility]monary/Critical [facility] 02/21/2014, 3:48 PM Pager:  [phone] After 3pm call: [phone]

## 2014-02-21 NOTE — Procedures (Signed)
Extubation Procedure Note  Patient Details:   Name: [name] DOB: 11/05/1968 MRN: [id number]   Airway Documentation:     Evaluation  O2 sats: stable throughout Complications: No apparent complications Patient did tolerate procedure well. Bilateral Breath Sounds: Clear;Diminished Suctioning: Airway;Oral Yes Patient extubated to 4lnc. No complications. Vital signs stable at this time. RN at bedside. RT will continue to monitor.  [name], [name] 02/21/2014, 12:01 PM

## 2014-02-21 NOTE — ED Notes (Signed)
[facility], sedated, calm, NAD, breathing with vent, VSS.

## 2014-02-21 NOTE — Progress Notes (Signed)
Patient presented to unit wearing 2 rings. Ring on right hand intact. Ring on left hand is missing the stone. Rings kept on hands at this time. Verified with Jodene NamMonica Cross RN.   Doree AlbeeSperry, Oluwatomisin Hustead Rosanne

## 2014-02-21 NOTE — Progress Notes (Signed)
[facility] FOR AM LAB REPLACEMENT ONLY  The [facility] does [facility] based on the criteria listed below:   1. Is GFR >/= 40 ml/min? Yes.    Patient's GFR today is >90 2. Is urine output >/= 0.5 ml/kg/hr for the last 6 hours? Yes.   Patient's UOP is 0.525 ml/kg/hr 3. Is BUN < 60 mg/dL? Yes.    Patient's BUN today is 15 4. Abnormal electrolyte(s): K+ 3.1 5. Ordered repletion with: see order 6. If a panic level lab has been reported, has the CCM MD in charge been notified? Yes.  .   Physician:  Dr. Tyson BabinskiZ   Breland Trouten A 02/21/2014 6:31 AM

## 2014-02-21 NOTE — Progress Notes (Signed)
PULMONARY / CRITICAL CARE MEDICINE   Name: [name] MRN: [id number] DOB: 11/05/1968    ADMISSION DATE:  02/20/2014  REFERRING MD :  Dr. Deretha Emory  CHIEF COMPLAINT:  Extreme agitation and low blood sugar  INITIAL PRESENTATION:  48 yo female with altered mental status from hypoglycemia.  She has hx of DM and seizures.  Intubated for airway protection.  STUDIES:  CT head: No acute intracranial abnormality. Mild sinusitis  SIGNIFICANT EVENTS: Intubated 02/20/14 Extubated 02/21/14  SUBJECTIVE:  Sedated on vent with diprivan  VITAL SIGNS: Temp:  [98.6 F (37 C)-101.9 F (38.8 C)] 99.2 F (37.3 C) (08/08 0845) Pulse Rate:  [89-133] 99 (08/08 0845) Resp:  [12-31] 19 (08/08 0845) BP: (89-193)/(57-107) 122/79 mmHg (08/08 0845) SpO2:  [100 %] 100 % (08/08 0845) FiO2 (%):  [30 %-100 %] 30 % (08/08 0751) Weight:  [104 lb 15 oz (47.6 kg)-120 lb (54.432 kg)] 104 lb 15 oz (47.6 kg) (08/08 0151) VENTILATOR SETTINGS: Vent Mode:  [-] CPAP;PSV FiO2 (%):  [30 %-100 %] 30 % Set Rate:  [12 bmp-14 bmp] 12 bmp Vt Set:  [400 mL-460 mL] 400 mL PEEP:  [5 cmH20] 5 cmH20 Pressure Support:  [5 cmH20] 5 cmH20 Plateau Pressure:  [13 cmH20-18 cmH20] 13 cmH20 INTAKE / OUTPUT:  Intake/Output Summary (Last 24 hours) at 02/21/14 0903 Last data filed at 02/21/14 0800  Gross per 24 hour  Intake 1586.4 ml  Output    690 ml  Net  896.4 ml    PHYSICAL EXAMINATION: General:  Intubated, sedated, ET tube in mouth, multiple missing teeth Neuro:  Withdraws all extremities to pain, present cough and gag reflex, pupils equally small and sluggish, diprivan currently off HEENT:  Atraumatic, no stridor, no enlarged neck LN Cardiovascular:  RRR, no  murmur Lungs:  Coarse breath sounds bilaterally Abdomen:  Soft ,no guarding, present bowel sounds Musculoskeletal:  No gross deformities, no edema Skin:  Has multiple scabs and shallow ulcers with mild excoriations on the lower extremities, no frank purulent  discharge  LABS:  CBC  Recent Labs Lab 02/16/14 1852 02/20/14 1843 02/21/14 0314  WBC 6.8 11.6* 6.6  HGB 11.7* 10.6* 11.2*  HCT 38.3 34.7* 35.8*  PLT 157 161 151   BMET  Recent Labs Lab 02/16/14 1852 02/20/14 1843 02/21/14 0314  NA 137 139 137  K 5.2 4.3 3.1*  CL 100 101 98  CO2 25 24 24   BUN 16 18 15   CREATININE 0.59 0.60 0.71  GLUCOSE 147* 146* 197*   Electrolytes  Recent Labs Lab 02/16/14 1852 02/20/14 1843 02/20/14 1933 02/21/14 0314  CALCIUM 8.9 8.2*  --  8.2*  MG  --   --  1.7 2.7*  PHOS  --   --   --  2.8   Sepsis Markers  Recent Labs Lab 02/20/14 2026  LATICACIDVEN 1.03   ABG  Recent Labs Lab 02/20/14 2147 02/21/14 0406  PHART 7.478* 7.476*  PCO2ART 38.5 35.1  PO2ART 491.0* 151.0*   Liver Enzymes  Recent Labs Lab 02/16/14 1852 02/20/14 1843 02/21/14 0314  AST 42* 45* 37  ALT 13 19 19   ALKPHOS 48 52 54  BILITOT 0.2* 0.3 0.5  ALBUMIN 3.9 3.4* 3.4*   Cardiac Enzymes  Recent Labs Lab 02/16/14 1852  TROPONINI <0.30   Glucose  Recent Labs Lab 02/20/14 1855 02/20/14 2254 02/21/14 0046 02/21/14 0200 02/21/14 0341 02/21/14 0703  GLUCAP 121* 98 58* 188* 212* 197*    Imaging Ct Head Wo Contrast  02/20/2014  CLINICAL DATA:  Altered mental status, hypoglycemia  EXAM: CT HEAD WITHOUT CONTRAST  TECHNIQUE: Contiguous axial images were obtained from the base of the skull through the vertex without intravenous contrast.  COMPARISON:  02/16/2014  FINDINGS: No mass lesion. No midline shift. No acute hemorrhage or hematoma. No extra-axial fluid collections. No evidence of acute infarction. Calvarium is intact. There is scattered bilateral ethmoid air cell opacification. There is a small left maxillary sinus mucous retention cyst.  IMPRESSION: No acute intracranial abnormality.  Mild sinusitis.   Electronically Signed   By: Esperanza Heiraymond  Rubner M.D.   On: 02/20/2014 22:44   Dg Chest Portable 1 View  02/20/2014   CLINICAL DATA:  48 year old  female with altered mental status -intubated.  EXAM: PORTABLE CHEST - 1 VIEW  COMPARISON:  02/16/2014 chest radiograph  FINDINGS: The cardiomediastinal silhouette is unremarkable.  An endotracheal tube is noted with tip 1 cm above the carina -consider 1-2 cm retraction.  There is no evidence of focal airspace disease, pulmonary edema, suspicious pulmonary nodule/mass, pleural effusion, or pneumothorax. No acute bony abnormalities are identified.  IMPRESSION: Endotracheal tube with tip 1 cm above the carina -consider 1-2 cm retraction.  No acute cardiopulmonary abnormality.   Electronically Signed   By: Laveda AbbeJeff  Hu M.D.   On: 02/20/2014 20:50   CXR 02/21/14: NAD  ASSESSMENT / PLAN:  PULMONARY A: intubated due to extreme combativeness, cannot rule out aspiration pneumonia for now P:   Extubate 8/08  CARDIOVASCULAR A: Transient tachycardia likely from stress P:  monitor  RENAL A:  No acute issue P:   IVF maintenance, monitor I/O, avoid nephrotoxic meds  GASTROINTESTINAL A:  Nutrition P:   Advance diet after extubation  HEMATOLOGIC A:  Mildly elevated WBC, chronic anemia P:  monitor  INFECTIOUS A:   Possible aspiration PNA. P:   BCx2 >> UC >> Sputum>> Abx:  Unasyn 8/7 >>>  ENDOCRINE A:  Low blood sugar, known DM on insulin(glucose now 193 8/8) P:   SSI  NEUROLOGIC A:   Acute encephalopathy 2nd to hypoglycemia >> resolved 8/08. P:   Monitor mental status  TODAY'S SUMMARY: Hold sedation, attempt wean from vent.   Brett CanalesSteve Minor ACNP [facility] Pager [phone] till 3 pm If no answer page [phone] 02/21/2014, 9:16 AM  Reviewed, examined, and documentation changes made as needed.  CC time 35 minutes.  Coralyn HellingVineet Nike Southwell, MD [facility] Pulmonary/Critical [facility] 02/21/2014, 11:54 AM Pager:  [phone] After 3pm call: [phone]

## 2014-02-21 NOTE — ED Notes (Signed)
Pt moving arms, sedation increased.

## 2014-02-21 NOTE — Significant Event (Signed)
Failed extubation and required reintubation. Difficult intubation. ++ cord edema and 6.5 OTT largest that could be placed. Resume sedation and previous vent settings. Careful with extubation. BP 127/66  Pulse 111  Temp(Src) 99.8 F (37.7 C) (Core (Comment))  Resp 19  Ht 5\' 10"  (1.778 m)  Wt 104 lb 15 oz (47.6 kg)  BMI 15.06 kg/m2  SpO2 100%  LMP 02/05/2014  Recent Labs Lab 02/16/14 1852 02/20/14 1843 02/21/14 0314  NA 137 139 137  K 5.2 4.3 3.1*  CL 100 101 98  CO2 25 24 24   BUN 16 18 15   CREATININE 0.59 0.60 0.71  GLUCOSE 147* 146* 197*    Recent Labs Lab 02/16/14 1852 02/20/14 1843 02/21/14 0314  HGB 11.7* 10.6* 11.2*  HCT 38.3 34.7* 35.8*  WBC 6.8 11.6* 6.6  PLT 157 161 151    Ct Head Wo Contrast  02/20/2014   CLINICAL DATA:  Altered mental status, hypoglycemia  EXAM: CT HEAD WITHOUT CONTRAST  TECHNIQUE: Contiguous axial images were obtained from the base of the skull through the vertex without intravenous contrast.  COMPARISON:  02/16/2014  FINDINGS: No mass lesion. No midline shift. No acute hemorrhage or hematoma. No extra-axial fluid collections. No evidence of acute infarction. Calvarium is intact. There is scattered bilateral ethmoid air cell opacification. There is a small left maxillary sinus mucous retention cyst.  IMPRESSION: No acute intracranial abnormality.  Mild sinusitis.   Electronically Signed   By: Esperanza Heiraymond  Rubner M.D.   On: 02/20/2014 22:44   Dg Chest Port 1 View  02/21/2014   CLINICAL DATA:  Endotracheal tube repositioning.  EXAM: PORTABLE CHEST - 1 VIEW  COMPARISON:  Chest radiograph performed 02/20/2014  FINDINGS: The patient's endotracheal tube is seen ending approximately 2 cm above the carina. The enteric tube is noted extending below the diaphragm, ending at the fundus of the stomach.  The lungs appear clear bilaterally. There is no definite evidence for aspiration pneumonia. Bilateral nipple shadows are noted. No focal consolidation, pleural  effusion or pneumothorax is seen.  The cardiomediastinal silhouette is within normal limits. No acute osseous abnormalities are identified.  IMPRESSION: 1. Endotracheal tube seen ending 2 cm above the carina. 2. Lungs clear bilaterally.   Electronically Signed   By: Roanna RaiderJeffery  Chang M.D.   On: 02/21/2014 01:09   Dg Chest Portable 1 View  02/20/2014   CLINICAL DATA:  48 year old female with altered mental status -intubated.  EXAM: PORTABLE CHEST - 1 VIEW  COMPARISON:  02/16/2014 chest radiograph  FINDINGS: The cardiomediastinal silhouette is unremarkable.  An endotracheal tube is noted with tip 1 cm above the carina -consider 1-2 cm retraction.  There is no evidence of focal airspace disease, pulmonary edema, suspicious pulmonary nodule/mass, pleural effusion, or pneumothorax. No acute bony abnormalities are identified.  IMPRESSION: Endotracheal tube with tip 1 cm above the carina -consider 1-2 cm retraction.  No acute cardiopulmonary abnormality.   Electronically Signed   By: Laveda AbbeJeff  Hu M.D.   On: 02/20/2014 20:50   Brett CanalesSteve Minor ACNP [facility] Pager [phone] till 3 pm If no answer page [phone] 02/21/2014, 12:54 PM  CC time 55 minutes.  Coralyn HellingVineet Ethne Jeon, MD [facility] 02/21/2014, 1:01 PM Pager:  [phone] After 3pm call: [phone]

## 2014-02-21 NOTE — ED Notes (Signed)
Attempt[facility] 3rd IV unsuccessful[facility] No changes,VSS.

## 2014-02-21 NOTE — ED Notes (Signed)
Dextrose in by slow IVP. Pt remains sedated. vanc infusing. Report called to 60M 06 primary receiving RN, preparing to transport.

## 2014-02-21 NOTE — Procedures (Signed)
Intubation Procedure Note Nicole MiloLenora [name] [id number] 11/05/1968  Procedure: Intubation Indications: Airway protection and maintenance  Procedure Details Consent: Unable to obtain consent because of emergent medical necessity. Time Out: Verified patient identification, verified procedure, site/side was marked, verified correct patient position, special equipment/implants available, medications/allergies/relevent history reviewed, required imaging and test results available.  Performed  Maximum sterile technique was used including gloves, gown, hand hygiene, mask and sheet.  MAC and 3    Evaluation Hemodynamic Status: BP stable throughout; O2 sats: transiently fell during during procedure Patient's Current Condition: stable Complications: Complications of difficult airway Patient did tolerate procedure well. Chest X-ray ordered to verify placement.  CXR: tube position acceptable.   [name], [name] [name] 02/21/2014

## 2014-02-22 LAB — CBC
HEMATOCRIT: 29.4 % — AB (ref 36.0–46.0)
Hemoglobin: 9.2 g/dL — ABNORMAL LOW (ref 12.0–15.0)
MCH: 20.4 pg — AB (ref 26.0–34.0)
MCHC: 31.3 g/dL (ref 30.0–36.0)
MCV: 65 fL — ABNORMAL LOW (ref 78.0–100.0)
Platelets: 172 10*3/uL (ref 150–400)
RBC: 4.52 MIL/uL (ref 3.87–5.11)
RDW: 15.4 % (ref 11.5–15.5)
WBC: 9.3 10*3/uL (ref 4.0–10.5)

## 2014-02-22 LAB — BASIC METABOLIC PANEL
Anion gap: 13 (ref 5–15)
BUN: 16 mg/dL (ref 6–23)
CALCIUM: 7.6 mg/dL — AB (ref 8.4–10.5)
CO2: 22 mEq/L (ref 19–32)
CREATININE: 0.76 mg/dL (ref 0.50–1.10)
Chloride: 106 mEq/L (ref 96–112)
GFR calc Af Amer: >90 mL/min (ref >90)
Glucose, Bld: 208 mg/dL — ABNORMAL HIGH (ref 70–99)
POTASSIUM: 3.8 meq/L (ref 3.7–5.3)
Sodium: 141 mEq/L (ref 137–147)

## 2014-02-22 LAB — GLUCOSE, CAPILLARY
GLUCOSE-CAPILLARY: 213 mg/dL — AB (ref 70–99)
Glucose-Capillary: 172 mg/dL — ABNORMAL HIGH (ref 70–99)
Glucose-Capillary: 166 mg/dL — ABNORMAL HIGH (ref 70–99)
Glucose-Capillary: 274 mg/dL — ABNORMAL HIGH (ref 70–99)
Glucose-Capillary: 234 mg/dL — ABNORMAL HIGH (ref 70–99)
Glucose-Capillary: 171 mg/dL — ABNORMAL HIGH (ref 70–99)

## 2014-02-22 LAB — TRIGLYCERIDES: TRIGLYCERIDES: 154 mg/dL — AB (ref <150)

## 2014-02-22 LAB — MAGNESIUM: MAGNESIUM: 2.1 mg/dL (ref 1.5–2.5)

## 2014-02-22 LAB — PHOSPHORUS: Phosphorus: 1.3 mg/dL — ABNORMAL LOW (ref 2.3–4.6)

## 2014-02-22 MED ORDER — SODIUM CHLORIDE 0.9 % IV BOLUS (SEPSIS)
500.0000 mL | Freq: Once | INTRAVENOUS | Status: AC
  Administered 2014-02-22: 500 mL via INTRAVENOUS

## 2014-02-22 MED ORDER — DEXAMETHASONE SODIUM PHOSPHATE 4 MG/ML IJ SOLN
4.0000 mg | Freq: Every day | INTRAMUSCULAR | Status: AC
  Administered 2014-02-22 – 2014-02-23 (×2): 4 mg via INTRAVENOUS
  Filled 2014-02-22 (×2): qty 1

## 2014-02-22 NOTE — Progress Notes (Addendum)
Brief Nutrition Note  Consult received for enteral/tube feeding initiation and management.  Adult Enteral Nutrition Protocol initiated by MD. Currently receiving Vital 1.2 @ 40 mL/hr, however not on PEPuP protocol. Full assessment to follow.  Admitting Dx: Hypoglycemia [251.2] Altered mental status, unspecified altered mental status type [780.97]  Body mass index is 15.72 kg/(m^2). Pt meets criteria for underweight based on current BMI.  Labs:   Recent Labs Lab 02/20/14 1843 02/20/14 1933 02/21/14 0314 02/22/14 0230  NA 139  --  137 141  K 4.3  --  3.1* 3.8  CL 101  --  98 106  CO2 24  --  24 22  BUN 18  --  15 16  CREATININE 0.60  --  0.71 0.76  CALCIUM 8.2*  --  8.2* 7.6*  MG  --  1.7 2.7* 2.1  PHOS  --   --  2.8 1.3*  GLUCOSE 146*  --  197* 208*    Nicole DysKacie Nasif Bos, MS RD LDN Clinical Inpatient Dietitian Weekend/After hours pager: [phone]

## 2014-02-22 NOTE — Progress Notes (Signed)
eLink Physician-Brief Progress Note Patient Name: [name] DOB: 11/05/1968 MRN: [id number]  Date of Service  02/22/2014   HPI/Events of Note   Restraint renewal requested.  eICU Interventions   Restraints renewed.   Intervention Category Minor Interventions: Routine modifications to care plan (e.g. PRN medications for pain, fever)  Aqua Denslow R. 02/22/2014, 8:16 PM

## 2014-02-22 NOTE — Progress Notes (Signed)
PULMONARY / CRITICAL CARE MEDICINE   Name: [name] MRN: [id number] DOB: 11/05/1968    ADMISSION DATE:  02/20/2014  REFERRING MD :  Dr. Deretha Emory  CHIEF COMPLAINT:  Extreme agitation and low blood sugar  INITIAL PRESENTATION:  48 yo female with altered mental status from hypoglycemia.  She has hx of DM and seizures.  Intubated for airway protection.  STUDIES:  CT head: No acute intracranial abnormality. Mild sinusitis  SIGNIFICANT EVENTS: Intubated 02/20/14 Extubated 02/21/14 Reintubated 8/8 for stridor. Difficult intubation. 6.5 tube.  SUBJECTIVE:  Sedated on vent   VITAL SIGNS: Temp:  [97.8 F (36.6 C)-102.7 F (39.3 C)] 99.8 F (37.7 C) (08/09 0700) Pulse Rate:  [96-132] 108 (08/09 0823) Resp:  [12-22] 16 (08/09 0823) BP: (61-185)/(44-86) 110/67 mmHg (08/09 0823) SpO2:  [77 %-100 %] 100 % (08/09 0823) FiO2 (%):  [30 %-40 %] 30 % (08/09 0824) Weight:  [109 lb 9.1 oz (49.7 kg)] 109 lb 9.1 oz (49.7 kg) (08/09 0446) VENTILATOR SETTINGS: Vent Mode:  [-] PRVC FiO2 (%):  [30 %-40 %] 30 % Set Rate:  [12 bmp-16 bmp] 16 bmp Vt Set:  [400 mL-550 mL] 550 mL PEEP:  [5 cmH20] 5 cmH20 Plateau Pressure:  [18 cmH20-30 cmH20] 26 cmH20 INTAKE / OUTPUT:  Intake/Output Summary (Last 24 hours) at 02/22/14 0841 Last data filed at 02/22/14 0800  Gross per 24 hour  Intake 2040.8 ml  Output    465 ml  Net 1575.8 ml    PHYSICAL EXAMINATION: General:  Intubated, sedated, ET tube in mouth, multiple missing teeth Neuro:  Withdraws all extremities to pain, present cough and gag reflex, pupils equal and reactive HEENT:  Atraumatic, no stridor, no enlarged neck LN Cardiovascular:  RRR, no  murmur Lungs:  Coarse breath sounds bilaterally Abdomen:  Soft ,no guarding, present bowel sounds,TF at 40 cc/hr Musculoskeletal:  No gross deformities, no edema Skin:  Has multiple scabs and shallow ulcers with mild excoriations on the lower extremities, no frank purulent  discharge  LABS:  CBC  Recent Labs Lab 02/20/14 1843 02/21/14 0314 02/22/14 0230  WBC 11.6* 6.6 9.3  HGB 10.6* 11.2* 9.2*  HCT 34.7* 35.8* 29.4*  PLT 161 151 172   BMET  Recent Labs Lab 02/20/14 1843 02/21/14 0314 02/22/14 0230  NA 139 137 141  K 4.3 3.1* 3.8  CL 101 98 106  CO2 24 24 22   BUN 18 15 16   CREATININE 0.60 0.71 0.76  GLUCOSE 146* 197* 208*   Electrolytes  Recent Labs Lab 02/20/14 1843 02/20/14 1933 02/21/14 0314 02/22/14 0230  CALCIUM 8.2*  --  8.2* 7.6*  MG  --  1.7 2.7* 2.1  PHOS  --   --  2.8 1.3*   Sepsis Markers  Recent Labs Lab 02/20/14 2026  LATICACIDVEN 1.03   ABG  Recent Labs Lab 02/20/14 2147 02/21/14 0406 02/21/14 1427  PHART 7.478* 7.476* 7.443  PCO2ART 38.5 35.1 33.9*  PO2ART 491.0* 151.0* 110.0*   Liver Enzymes  Recent Labs Lab 02/16/14 1852 02/20/14 1843 02/21/14 0314  AST 42* 45* 37  ALT 13 19 19   ALKPHOS 48 52 54  BILITOT 0.2* 0.3 0.5  ALBUMIN 3.9 3.4* 3.4*   Cardiac Enzymes  Recent Labs Lab 02/16/14 1852 02/21/14 1017  TROPONINI <0.30  --   PROBNP  --  81.3   Glucose  Recent Labs Lab 02/21/14 1206 02/21/14 1635 02/21/14 1945 02/21/14 2352 02/22/14 0349 02/22/14 0707  GLUCAP 220* 163* 161* 213* 172* 166*  Imaging Dg Chest Port 1 View  02/21/2014   CLINICAL DATA:  48 year old female with endotracheal tube placement. Altered mental status and hypoglycemia.  EXAM: PORTABLE CHEST - 1 VIEW  COMPARISON:  02/21/2014 and prior chest radiographs dating back to 02/16/2014.  FINDINGS: Endotracheal tube is identified with tip 2 cm above the carina.  An NG tube is identified entering the stomach with tip off the field of view.  New mild bilateral interstitial opacities are identified -question mild edema.  There is no evidence of pleural effusion or pneumothorax.  No acute bony abnormalities are identified.  IMPRESSION: New mild interstitial opacities -question mild edema. Interstitial infection or  inflammation is a consideration but considered less likely.  Support apparatus as described.   Electronically Signed   By: Laveda Abbe M.D.   On: 02/21/2014 14:18   Dg Chest Port 1 View  02/21/2014   CLINICAL DATA:  Endotracheal tube repositioning.  EXAM: PORTABLE CHEST - 1 VIEW  COMPARISON:  Chest radiograph performed 02/20/2014  FINDINGS: The patient's endotracheal tube is seen ending approximately 2 cm above the carina. The enteric tube is noted extending below the diaphragm, ending at the fundus of the stomach.  The lungs appear clear bilaterally. There is no definite evidence for aspiration pneumonia. Bilateral nipple shadows are noted. No focal consolidation, pleural effusion or pneumothorax is seen.  The cardiomediastinal silhouette is within normal limits. No acute osseous abnormalities are identified.  IMPRESSION: 1. Endotracheal tube seen ending 2 cm above the carina. 2. Lungs clear bilaterally.   Electronically Signed   By: Roanna Raider M.D.   On: 02/21/2014 01:09   Dg Abd Portable 1v  02/21/2014   CLINICAL DATA:  Orogastric tube placement.  EXAM: PORTABLE ABDOMEN - 1 VIEW  COMPARISON:  None.  FINDINGS: Mildly distended small bowel loops on this supine portable radiograph. No visible free air. Orogastric tube tip lies along the greater curvature of the stomach near the pylorus. The last side hole is tube is well within the stomach. There are no osseous findings are abnormal calcifications.  IMPRESSION: Orogastric tube tip lies near the pylorus.   Electronically Signed   By: Davonna Belling M.D.   On: 02/21/2014 14:17   CXR 02/22/14: None  ASSESSMENT / PLAN:  PULMONARY A:  intubated due to extreme combativeness, cannot rule out aspiration pneumonia for now. Extubated 8/8 and promptly reintubated with stridor. Difficult intubation. Cords edematous P:   Reintubated 8/8 due to stridor >> difficult airway Vent bundle Careful with future extubation. brief run of decadron for cord  edema  CARDIOVASCULAR A:  Transient tachycardia likely from stress P:  monitor  RENAL A:   No acute issue P:   IVF maintenance, monitor I/O, avoid nephrotoxic meds  GASTROINTESTINAL A:   Nutrition P:   TF will intubated  HEMATOLOGIC A:   Mildly elevated WBC, chronic anemia P:  monitor  INFECTIOUS A:   Possible aspiration PNA. P:   BCx2 >> UC >> Sputum>> Abx:  Unasyn 8/7 >>>  ENDOCRINE CBG (last 3)   Recent Labs  02/21/14 2352 02/22/14 0349 02/22/14 0707  GLUCAP 213* 172* 166*     A:   Low blood sugar, known DM on insulin(glucose now 166 8/9) P:   SSI Careful if given steroids  NEUROLOGIC A:   Acute encephalopathy 2nd to hypoglycemia >> resolved 8/08. P:   Monitor mental status  TODAY'S SUMMARY: Failed extubation 8/8 with severe stridor. 8/9 decadron x 2 doses   Brett Canales Minor ACNP  [facility] Pager [phone] till 3 pm If no answer page [phone] 02/22/2014, 8:41 AM  Reviewed above, examined.  Will continue full vent support.  Tx with decadron for upper airway.  Continue Abx pending cx results.  Might need ENT evaluation prior to extubation.  CC time 35 minutes.  Coralyn HellingVineet Ananth Fiallos, MD [facility] 02/22/2014, 2:25 PM Pager:  [phone] After 3pm call: [phone]

## 2014-02-23 ENCOUNTER — Inpatient Hospital Stay (HOSPITAL_COMMUNITY): Payer: Medicaid Other

## 2014-02-23 DIAGNOSIS — G9340 Encephalopathy, unspecified: Secondary | ICD-10-CM

## 2014-02-23 LAB — CBC
HEMATOCRIT: 25.8 % — AB (ref 36.0–46.0)
Hemoglobin: 8.2 g/dL — ABNORMAL LOW (ref 12.0–15.0)
MCH: 20.1 pg — ABNORMAL LOW (ref 26.0–34.0)
MCHC: 31.8 g/dL (ref 30.0–36.0)
MCV: 63.4 fL — ABNORMAL LOW (ref 78.0–100.0)
Platelets: 156 10*3/uL (ref 150–400)
RBC: 4.07 MIL/uL (ref 3.87–5.11)
RDW: 15.3 % (ref 11.5–15.5)
WBC: 9 10*3/uL (ref 4.0–10.5)

## 2014-02-23 LAB — GLUCOSE, CAPILLARY
GLUCOSE-CAPILLARY: 257 mg/dL — AB (ref 70–99)
Glucose-Capillary: 151 mg/dL — ABNORMAL HIGH (ref 70–99)
Glucose-Capillary: 184 mg/dL — ABNORMAL HIGH (ref 70–99)
Glucose-Capillary: 236 mg/dL — ABNORMAL HIGH (ref 70–99)
Glucose-Capillary: 266 mg/dL — ABNORMAL HIGH (ref 70–99)
Glucose-Capillary: 272 mg/dL — ABNORMAL HIGH (ref 70–99)

## 2014-02-23 LAB — BASIC METABOLIC PANEL
Anion gap: 14 (ref 5–15)
BUN: 20 mg/dL (ref 6–23)
CHLORIDE: 106 meq/L (ref 96–112)
CO2: 19 mEq/L (ref 19–32)
Calcium: 7.8 mg/dL — ABNORMAL LOW (ref 8.4–10.5)
Creatinine, Ser: 0.54 mg/dL (ref 0.50–1.10)
GFR calc Af Amer: >90 mL/min (ref >90)
GFR calc non Af Amer: >90 mL/min (ref >90)
Glucose, Bld: 240 mg/dL — ABNORMAL HIGH (ref 70–99)
Potassium: 3.1 mEq/L — ABNORMAL LOW (ref 3.7–5.3)
Sodium: 139 mEq/L (ref 137–147)

## 2014-02-23 LAB — PROCALCITONIN: Procalcitonin: 0.82 ng/mL

## 2014-02-23 MED ORDER — FENTANYL BOLUS VIA INFUSION
50.0000 ug | INTRAVENOUS | Status: DC | PRN
  Filled 2014-02-23: qty 100

## 2014-02-23 MED ORDER — SODIUM CHLORIDE 0.9 % IV SOLN
0.0000 ug/h | INTRAVENOUS | Status: DC
  Administered 2014-02-23: 125 ug/h via INTRAVENOUS
  Administered 2014-02-23: 50 ug/h via INTRAVENOUS
  Administered 2014-02-24: 225 ug/h via INTRAVENOUS
  Administered 2014-02-24: 300 ug/h via INTRAVENOUS
  Administered 2014-02-25 (×2): 400 ug/h via INTRAVENOUS
  Administered 2014-02-25: 350 ug/h via INTRAVENOUS
  Administered 2014-02-26 (×2): 400 ug/h via INTRAVENOUS
  Administered 2014-02-26: 300 ug/h via INTRAVENOUS
  Administered 2014-02-26: 400 ug/h via INTRAVENOUS
  Administered 2014-02-26: 300 ug/h via INTRAVENOUS
  Administered 2014-02-27 – 2014-03-02 (×11): 400 ug/h via INTRAVENOUS
  Filled 2014-02-23 (×25): qty 50

## 2014-02-23 MED ORDER — INSULIN ASPART 100 UNIT/ML ~~LOC~~ SOLN
2.0000 [IU] | SUBCUTANEOUS | Status: DC
  Administered 2014-02-23: 2 [IU] via SUBCUTANEOUS
  Administered 2014-02-23: 6 [IU] via SUBCUTANEOUS
  Administered 2014-02-24: 4 [IU] via SUBCUTANEOUS
  Administered 2014-02-25 (×2): 2 [IU] via SUBCUTANEOUS
  Administered 2014-02-25 – 2014-02-26 (×2): 4 [IU] via SUBCUTANEOUS
  Administered 2014-02-26 (×2): 6 [IU] via SUBCUTANEOUS
  Administered 2014-02-26 – 2014-02-27 (×2): 4 [IU] via SUBCUTANEOUS
  Administered 2014-02-27 (×3): 6 [IU] via SUBCUTANEOUS
  Administered 2014-02-27 (×2): 2 [IU] via SUBCUTANEOUS
  Administered 2014-02-28 (×2): 6 [IU] via SUBCUTANEOUS
  Administered 2014-02-28: 4 [IU] via SUBCUTANEOUS
  Administered 2014-02-28: 6 [IU] via SUBCUTANEOUS

## 2014-02-23 MED ORDER — SODIUM CHLORIDE 0.9 % IJ SOLN
10.0000 mL | Freq: Two times a day (BID) | INTRAMUSCULAR | Status: DC
  Administered 2014-02-23 – 2014-02-25 (×5): 10 mL
  Administered 2014-02-25: 30 mL
  Administered 2014-02-26 – 2014-02-27 (×3): 10 mL
  Administered 2014-02-27: 20 mL
  Administered 2014-02-28 – 2014-03-04 (×8): 10 mL

## 2014-02-23 MED ORDER — FENTANYL CITRATE 0.05 MG/ML IJ SOLN: 50.0000 ug | Freq: Once | INTRAMUSCULAR | Status: DC

## 2014-02-23 MED ORDER — POTASSIUM PHOSPHATES 15 MMOLE/5ML IV SOLN
24.0000 mmol | Freq: Once | INTRAVENOUS | Status: AC
  Administered 2014-02-23: 24 mmol via INTRAVENOUS
  Filled 2014-02-23: qty 8

## 2014-02-23 MED ORDER — INSULIN ASPART 100 UNIT/ML ~~LOC~~ SOLN
4.0000 [IU] | Freq: Three times a day (TID) | SUBCUTANEOUS | Status: DC
  Administered 2014-02-23: 4 [IU] via SUBCUTANEOUS

## 2014-02-23 MED ORDER — SODIUM CHLORIDE 0.9 % IJ SOLN
10.0000 mL | INTRAMUSCULAR | Status: DC | PRN
Start: 2014-02-23 — End: 2014-03-04

## 2014-02-23 MED ORDER — INSULIN GLARGINE 100 UNIT/ML ~~LOC~~ SOLN
15.0000 [IU] | Freq: Every day | SUBCUTANEOUS | Status: DC
  Administered 2014-02-23: 15 [IU] via SUBCUTANEOUS
  Filled 2014-02-23 (×2): qty 0.15

## 2014-02-23 MED ORDER — SODIUM CHLORIDE 0.9 % IV SOLN
INTRAVENOUS | Status: DC
  Administered 2014-02-23: 13:00:00 via INTRAVENOUS
  Administered 2014-02-24: 75 mL/h via INTRAVENOUS

## 2014-02-23 MED ORDER — POTASSIUM CHLORIDE 20 MEQ/15ML (10%) PO LIQD
40.0000 meq | ORAL | Status: AC
  Administered 2014-02-23 (×2): 40 meq
  Filled 2014-02-23 (×2): qty 30

## 2014-02-23 MED ORDER — SODIUM CHLORIDE 0.9 % IV BOLUS (SEPSIS): 1000.0000 mL | Freq: Once | INTRAVENOUS | Status: DC

## 2014-02-23 NOTE — Progress Notes (Signed)
eLink Physician-Brief Progress Note Patient Name: Nicole MiloLenora Pinnix DOB: 11/05/1968 MRN: [id number]  Date of Service  02/23/2014   HPI/Events of Note  Hypokalemia   eICU Interventions  Potassium replaced   Intervention Category Intermediate Interventions: Electrolyte abnormality - evaluation and management  DETERDING,ELIZABETH 02/23/2014, 4:43 AM

## 2014-02-23 NOTE — Progress Notes (Signed)
PULMONARY / CRITICAL CARE MEDICINE   Name: [name] MRN: [id number] DOB: 11/05/1968    ADMISSION DATE:  02/20/2014  REFERRING MD :  Dr. Deretha Emory  CHIEF COMPLAINT:  Extreme agitation and low blood sugar  INITIAL PRESENTATION:  48 yo female with altered mental status from hypoglycemia.  She has hx of DM and seizures.  Intubated for airway protection.   has a past medical history of Diabetes mellitus without complication.   has past surgical history that includes Tracheostomy and Appendectomy.   STUDIES:  CT head: No acute intracranial abnormality. Mild sinusitis  SIGNIFICANT EVENTS: Intubated 02/20/14 Extubated 02/21/14 Reintubated 8/8 for stridor. Difficult intubation. 6.5 tube. 02/22/14: Sedated on vent    SUBJECTIVE/OVERNIGHT/INTERVAL HX 02/23/14: Overnight agitated and attempting to self extubated. Now deeply sedated with fent and diprivan gtt. Fever curve coming down  VITAL SIGNS: Temp:  [97.3 F (36.3 C)-100.9 F (38.3 C)] 97.7 F (36.5 C) (08/10 0826) Pulse Rate:  [60-103] 65 (08/10 0900) Resp:  [16-26] 16 (08/10 0900) BP: (95-158)/(52-85) 131/64 mmHg (08/10 0900) SpO2:  [100 %] 100 % (08/10 0900) FiO2 (%):  [30 %] 30 % (08/10 0800) Weight:  [50.6 kg (111 lb 8.8 oz)] 50.6 kg (111 lb 8.8 oz) (08/10 0500) VENTILATOR SETTINGS: Vent Mode:  [-] PRVC FiO2 (%):  [30 %] 30 % Set Rate:  [16 bmp] 16 bmp Vt Set:  [550 mL] 550 mL PEEP:  [5 cmH20] 5 cmH20 Plateau Pressure:  [23 cmH20-32 cmH20] 32 cmH20 INTAKE / OUTPUT:  Intake/Output Summary (Last 24 hours) at 02/23/14 1025 Last data filed at 02/23/14 0900  Gross per 24 hour  Intake 1896.49 ml  Output    570 ml  Net 1326.49 ml    PHYSICAL EXAMINATION: General:  Intubated, sedated, ET tube in mouth, multiple missing teeth Neuro: RASS -3 with sedation gtt HEENT:  Atraumatic, no stridor, no enlarged neck LN. Small size et tube Cardiovascular:  RRR, no  murmur Lungs:  Coarse breath sounds bilaterally Abdomen:  Soft  ,no guarding, present bowel sounds,TF at 40 cc/hr Musculoskeletal:  No gross deformities, no edema Skin:  Has multiple scabs and shallow ulcers with mild excoriations on the lower extremities, no frank purulent discharge  LABS:  PULMONARY  Recent Labs Lab 02/20/14 2147 02/21/14 0406 02/21/14 1427  PHART 7.478* 7.476* 7.443  PCO2ART 38.5 35.1 33.9*  PO2ART 491.0* 151.0* 110.0*  HCO3 28.5* 25.8* 23.0  TCO2 30 27 24   O2SAT 100.0 99.0 98.0    CBC  Recent Labs Lab 02/21/14 0314 02/22/14 0230 02/23/14 0215  HGB 11.2* 9.2* 8.2*  HCT 35.8* 29.4* 25.8*  WBC 6.6 9.3 9.0  PLT 151 172 156    COAGULATION No results found for this basename: INR,  in the last 168 hours  CARDIAC   Recent Labs Lab 02/16/14 1852  TROPONINI <0.30    Recent Labs Lab 02/21/14 1017  PROBNP 81.3     CHEMISTRY  Recent Labs Lab 02/16/14 1852 02/20/14 1843 02/20/14 1933 02/21/14 0314 02/22/14 0230 02/23/14 0215  NA 137 139  --  137 141 139  K 5.2 4.3  --  3.1* 3.8 3.1*  CL 100 101  --  98 106 106  CO2 25 24  --  24 22 19   GLUCOSE 147* 146*  --  197* 208* 240*  BUN 16 18  --  15 16 20   CREATININE 0.59 0.60  --  0.71 0.76 0.54  CALCIUM 8.9 8.2*  --  8.2* 7.6* 7.8*  MG  --   --  1.7 2.7* 2.1  --   PHOS  --   --   --  2.8 1.3*  --    Estimated Creatinine Clearance: 70.2 ml/min (by C-G formula based on Cr of 0.54).   LIVER  Recent Labs Lab 02/16/14 1852 02/20/14 1843 02/21/14 0314  AST 42* 45* 37  ALT 13 19 19   ALKPHOS 48 52 54  BILITOT 0.2* 0.3 0.5  PROT 8.2 7.5 7.6  ALBUMIN 3.9 3.4* 3.4*     INFECTIOUS  Recent Labs Lab 02/20/14 2026  LATICACIDVEN 1.03     ENDOCRINE CBG (last 3)   Recent Labs  02/22/14 2348 02/23/14 0346 02/23/14 0758  GLUCAP 266* 184* 151*         IMAGING x48h No results found.  Dg Chest Port 1 View  02/23/2014   CLINICAL DATA:  Hypoxia  EXAM: PORTABLE CHEST - 1 VIEW  COMPARISON:  February 21, 2014  FINDINGS: Endotracheal tube  tip is 1.2 cm above the carina. Nasogastric tube tip and side port are below the diaphragm. No pneumothorax. There is no edema or consolidation. The heart size and pulmonary vascularity are normal. No adenopathy. No bone lesions.  IMPRESSION: Tube positions as described without pneumothorax. Note that the endotracheal tube is 1.2 cm above the carina. It may need to be withdrawn slightly. No edema or consolidation.   Electronically Signed   By: Bretta Bang M.D.   On: 02/23/2014 07:21   Dg Chest Port 1 View  02/21/2014   CLINICAL DATA:  48 year old female with endotracheal tube placement. Altered mental status and hypoglycemia.  EXAM: PORTABLE CHEST - 1 VIEW  COMPARISON:  02/21/2014 and prior chest radiographs dating back to 02/16/2014.  FINDINGS: Endotracheal tube is identified with tip 2 cm above the carina.  An NG tube is identified entering the stomach with tip off the field of view.  New mild bilateral interstitial opacities are identified -question mild edema.  There is no evidence of pleural effusion or pneumothorax.  No acute bony abnormalities are identified.  IMPRESSION: New mild interstitial opacities -question mild edema. Interstitial infection or inflammation is a consideration but considered less likely.  Support apparatus as described.   Electronically Signed   By: Laveda Abbe M.D.   On: 02/21/2014 14:18   Dg Abd Portable 1v  02/21/2014   CLINICAL DATA:  Orogastric tube placement.  EXAM: PORTABLE ABDOMEN - 1 VIEW  COMPARISON:  None.  FINDINGS: Mildly distended small bowel loops on this supine portable radiograph. No visible free air. Orogastric tube tip lies along the greater curvature of the stomach near the pylorus. The last side hole is tube is well within the stomach. There are no osseous findings are abnormal calcifications.  IMPRESSION: Orogastric tube tip lies near the pylorus.   Electronically Signed   By: Davonna Belling M.D.   On: 02/21/2014 14:17      ASSESSMENT /  PLAN:  PULMONARY A:  intubated due to extreme combativeness, cannot rule out aspiration pneumonia for now. Extubated 8/8 and promptly reintubated with stridor. Difficult intubation. Cords edematous. S.p 2 doses decadron 02/22/14   - does not meet sbt criteria due to agitation  P:   Vent bundle Careful with future extubation.   CARDIOVASCULAR PICC 02/23/14 ordered >> A:  Transient tachycardia likely from stress P:  monitor  RENAL A:   No acute issue P:   IVF maintenance, monitor I/O, avoid nephrotoxic meds  GASTROINTESTINAL A:   Nutrition P:   TF   HEMATOLOGIC  A:   Anemia of critical illness P:  monitor PRBBC for hgb <7gm%   INFECTIOUS A:   Possible aspiration PNA.   P:   BCx2 >> UC >> Sputum>> Abx:  Unasyn 8/7 >>> Check PCT  ENDOCRINE CBG (last 3)   Recent Labs  02/22/14 2348 02/23/14 0346 02/23/14 0758  GLUCAP 266* 184* 151*     A:   Low blood sugar, known DM on insulin(glucose now 166 8/9) P:   SSI Careful if given steroids  NEUROLOGIC A:   Acute encephalopathy 2nd to hypoglycemia >> resolved 8/08.   -ongoing agitation 02/23/14 during [facility]  P:   Monitor mental status  TODAY'S SUMMARY: Failed extubation 8/8 with severe stridor. 8/9 decadron x 2 doses. No family at bedside     The patient is critically ill with multiple organ systems failure and requires high complexity decision making for assessment and support, frequent evaluation and titration of therapies, application of advanced monitoring technologies and extensive interpretation of multiple databases.   Critical Care Time devoted to patient care services described in this note is  30  Minutes.  Dr. Kalman ShanMurali Yamili Lichtenwalner, M.D., [facility].C.P Pulmonary and Critical Care Medicine Staff Physician [facility] Pager: [phone], If no answer or between  15:00h - 7:00h: call [phone]  02/23/2014 10:36 AM

## 2014-02-23 NOTE — Progress Notes (Signed)
Inpatient Diabetes Program Recommendations  AACE/ADA: New Consensus Statement on Inpatient Glycemic Control (2013)  Target Ranges:  Prepandial:   less than 140 mg/dL      Peak postprandial:   less than 180 mg/dL (1-2 hours)      Critically ill patients:  140 - 180 mg/dL   Results for [name], [name] (MRN [id number]) as of 02/23/2014 10:46  Ref. Range 02/22/2014 15:36 02/22/2014 19:21 02/22/2014 23:48 02/23/2014 03:46 02/23/2014 07:58  Glucose-Capillary Latest Range: 70-99 mg/dL 409234 (H) 811171 (H) 914266 (H) 184 (H) 151 (H)  Reason for Assessment:  Note that patient admitted with hypoglycemia.   Diabetes history: Diabetes Outpatient Diabetes medications: Lantus 7 units bid Current orders for Inpatient glycemic control:  Novolog moderate q 4 hours  May consider reducing Novolog correction to sensitive q 4 hours.  Also may consider adding Lantus 5 units daily.  Note that patient received Decadron x 2 which likely contributed to increased CBG's.     Beryl MeagerJenny Kaziyah Parkison, RN, BC-ADM Inpatient Diabetes Coordinator Pager [phone]

## 2014-02-23 NOTE — Progress Notes (Signed)
Peripherally Inserted Central Catheter/Midline Placement  The IV Nurse has discussed with the patient and/or persons authorized to consent for the patient, the purpose of this procedure and the potential benefits and risks involved with this procedure.  The benefits include less needle sticks, lab draws from the catheter and patient may be discharged home with the catheter.  Risks include, but not limited to, infection, bleeding, blood clot (thrombus formation), and puncture of an artery; nerve damage and irregular heat beat.  Alternatives to this procedure were also discussed.  PICC/Midline Placement Documentation    Telephone consent from daughter    Vevelyn PatDuncan, Birdell Frasier Jean 02/23/2014, 12:34 PM

## 2014-02-23 NOTE — Progress Notes (Signed)
INITIAL NUTRITION ASSESSMENT  DOCUMENTATION CODES Per approved criteria  -Not Applicable   INTERVENTION:  Continue 22M PEPuP Protocol, TF via OGT with Vital High Protein at goal rate of 40 ml/h (960 ml per day) to provide 960 kcals, 84 gm protein, 803 ml free water daily.  Above TF regimen plus current propofol rate will provide a total of 1374 kcals per day (102% of estimated nutrition needs).  NUTRITION DIAGNOSIS: Inadequate oral intake related to inability to eat as evidenced by NPO status.   Goal: Intake to meet >90% of estimated nutrition needs.  Monitor:  TF tolerance/adequacy, weight trend, labs, vent status.  Reason for Assessment: MD Consult for TF initiation and management.  48 y.o. female  Admitting Dx: Extreme agitation and low blood sugar  ASSESSMENT: 48 yo female with altered mental status from hypoglycemia. She has hx of DM and seizures. Intubated on admission for airway protection.  Patient is currently intubated on ventilator support MV: 8.8 L/min Temp (24hrs), Avg:98.1 F (36.7 C), Min:97 F (36.1 C), Max:99.1 F (37.3 C)  Propofol: 15.7 ml/hr providing 414 kcals/day.  Currently receiving Vital High Protein at 40 ml/h to provide 960 kcals, 84 gm protein, 803 ml free water daily.  Height: Ht Readings from Last 1 Encounters:  02/23/14 5\' 2"  (1.575 m)    Weight: Wt Readings from Last 1 Encounters:  02/23/14 111 lb 8.8 oz (50.6 kg)    Ideal Body Weight: 50 kg  % Ideal Body Weight: 101%  Wt Readings from Last 10 Encounters:  02/23/14 111 lb 8.8 oz (50.6 kg)  02/16/14 179 lb (81.194 kg)    Usual Body Weight: unknown  % Usual Body Weight: NA  BMI:  Body mass index is 20.4 kg/(m^2).  Estimated Nutritional Needs: Kcal: 1348 Protein: 70-80 gm Fluid: 1.5 L  Skin: WDL  Diet Order: NPO  EDUCATION NEEDS: -Education not appropriate at this time   Intake/Output Summary (Last 24 hours) at 02/23/14 1417 Last data filed at 02/23/14 1300  Gross per 24 hour  Intake 3087.19 ml  Output    480 ml  Net 2607.19 ml    Last BM: PTA   Labs:   Recent Labs Lab 02/20/14 1843 02/20/14 1933 02/21/14 0314 02/22/14 0230 02/23/14 0215  NA 139  --  137 141 139  K 4.3  --  3.1* 3.8 3.1*  CL 101  --  98 106 106  CO2 24  --  24 22 19   BUN 18  --  15 16 20   CREATININE 0.60  --  0.71 0.76 0.54  CALCIUM 8.2*  --  8.2* 7.6* 7.8*  MG  --  1.7 2.7* 2.1  --   PHOS  --   --  2.8 1.3*  --   GLUCOSE 146*  --  197* 208* 240*    CBG (last 3)   Recent Labs  02/22/14 2348 02/23/14 0346 02/23/14 0758  GLUCAP 266* 184* 151*    Scheduled Meds: . ampicillin-sulbactam (UNASYN) IV  1.5 g Intravenous Q6H  . antiseptic oral rinse  7 mL Mouth Rinse QID  . chlorhexidine  15 mL Mouth Rinse BID  . famotidine  20 mg Oral QHS  . feeding supplement (VITAL HIGH PROTEIN)  1,000 mL Per Tube Q24H  . fentaNYL  50 mcg Intravenous Once  . heparin  5,000 Units Subcutaneous 3 times per day  . insulin aspart  0-15 Units Subcutaneous 6 times per day  . potassium phosphate IVPB (mmol)  24 mmol Intravenous  Once  . sodium chloride  1,000 mL Intravenous Once  . sodium chloride  10-40 mL Intracatheter Q12H    Continuous Infusions: . sodium chloride 75 mL/hr at 02/23/14 1300  . fentaNYL infusion INTRAVENOUS 125 mcg/hr (02/23/14 0900)  . propofol 55 mcg/kg/min (02/23/14 0820)    Past Medical History  Diagnosis Date  . Diabetes mellitus without complication     Past Surgical History  Procedure Laterality Date  . Tracheostomy    . Appendectomy      Joaquin Courts, RD, LDN, CNSC Pager [phone] After Hours Pager [phone]

## 2014-02-23 NOTE — Progress Notes (Signed)
RT retracted patient ETT 1cm to 21@ the lip per MD order. Patient tolerated well. No complications. Vital signs stable at this time. RT will continue to monitor.

## 2014-02-24 ENCOUNTER — Inpatient Hospital Stay (HOSPITAL_COMMUNITY): Payer: Medicaid Other

## 2014-02-24 DIAGNOSIS — J9600 Acute respiratory failure, unspecified whether with hypoxia or hypercapnia: Secondary | ICD-10-CM

## 2014-02-24 LAB — CBC WITH DIFFERENTIAL/PLATELET
BASOS ABS: 0 10*3/uL (ref 0.0–0.1)
BASOS PCT: 0 % (ref 0–1)
Eosinophils Absolute: 0.1 10*3/uL (ref 0.0–0.7)
Eosinophils Relative: 1 % (ref 0–5)
HEMATOCRIT: 25.3 % — AB (ref 36.0–46.0)
HEMOGLOBIN: 8 g/dL — AB (ref 12.0–15.0)
LYMPHS ABS: 1.9 10*3/uL (ref 0.7–4.0)
LYMPHS PCT: 27 % (ref 12–46)
MCH: 20.2 pg — ABNORMAL LOW (ref 26.0–34.0)
MCHC: 31.6 g/dL (ref 30.0–36.0)
MCV: 63.7 fL — AB (ref 78.0–100.0)
MONOS PCT: 8 % (ref 3–12)
Monocytes Absolute: 0.6 10*3/uL (ref 0.1–1.0)
Neutro Abs: 4.5 10*3/uL (ref 1.7–7.7)
Neutrophils Relative %: 64 % (ref 43–77)
Platelets: 166 10*3/uL (ref 150–400)
RBC: 3.97 MIL/uL (ref 3.87–5.11)
RDW: 16 % — AB (ref 11.5–15.5)
WBC: 7.1 10*3/uL (ref 4.0–10.5)

## 2014-02-24 LAB — GLUCOSE, CAPILLARY
GLUCOSE-CAPILLARY: 208 mg/dL — AB (ref 70–99)
GLUCOSE-CAPILLARY: 185 mg/dL — AB (ref 70–99)
Glucose-Capillary: 147 mg/dL — ABNORMAL HIGH (ref 70–99)
Glucose-Capillary: 35 mg/dL — CL (ref 70–99)
Glucose-Capillary: 94 mg/dL (ref 70–99)
Glucose-Capillary: 80 mg/dL (ref 70–99)
Glucose-Capillary: 102 mg/dL — ABNORMAL HIGH (ref 70–99)

## 2014-02-24 LAB — BASIC METABOLIC PANEL
Anion gap: 13 (ref 5–15)
BUN: 16 mg/dL (ref 6–23)
CHLORIDE: 110 meq/L (ref 96–112)
CO2: 21 mEq/L (ref 19–32)
Calcium: 7.2 mg/dL — ABNORMAL LOW (ref 8.4–10.5)
Creatinine, Ser: 0.53 mg/dL (ref 0.50–1.10)
GFR calc Af Amer: >90 mL/min (ref >90)
GFR calc non Af Amer: >90 mL/min (ref >90)
Glucose, Bld: 215 mg/dL — ABNORMAL HIGH (ref 70–99)
Potassium: 4.6 mEq/L (ref 3.7–5.3)
Sodium: 144 mEq/L (ref 137–147)

## 2014-02-24 LAB — PHOSPHORUS: Phosphorus: 2.7 mg/dL (ref 2.3–4.6)

## 2014-02-24 LAB — LACTIC ACID, PLASMA: Lactic Acid, Venous: 1 mmol/L (ref 0.5–2.2)

## 2014-02-24 LAB — CK TOTAL AND CKMB (NOT AT ARMC)
CK, MB: 4.8 ng/mL — ABNORMAL HIGH (ref 0.3–4.0)
RELATIVE INDEX: 1.2 (ref 0.0–2.5)
Total CK: 398 U/L — ABNORMAL HIGH (ref 7–177)

## 2014-02-24 LAB — MAGNESIUM: Magnesium: 1.6 mg/dL (ref 1.5–2.5)

## 2014-02-24 LAB — PRO B NATRIURETIC PEPTIDE: Pro B Natriuretic peptide (BNP): 340.7 pg/mL — ABNORMAL HIGH (ref 0–125)

## 2014-02-24 LAB — PROCALCITONIN: Procalcitonin: 0.7 ng/mL

## 2014-02-24 MED ORDER — VITAL HIGH PROTEIN PO LIQD
1000.0000 mL | ORAL | Status: DC
  Administered 2014-02-25 – 2014-02-26 (×2): 1000 mL
  Administered 2014-02-27: 02:00:00
  Filled 2014-02-24 (×4): qty 1000

## 2014-02-24 MED ORDER — INSULIN GLARGINE 100 UNIT/ML ~~LOC~~ SOLN
5.0000 [IU] | Freq: Every day | SUBCUTANEOUS | Status: DC
  Administered 2014-02-24 – 2014-03-03 (×8): 5 [IU] via SUBCUTANEOUS
  Filled 2014-02-24 (×9): qty 0.05

## 2014-02-24 MED ORDER — MAGNESIUM SULFATE 40 MG/ML IJ SOLN
2.0000 g | Freq: Once | INTRAMUSCULAR | Status: AC
  Administered 2014-02-24: 2 g via INTRAVENOUS
  Filled 2014-02-24: qty 50

## 2014-02-24 MED ORDER — DEXTROSE 50 % IV SOLN
INTRAVENOUS | Status: AC
  Administered 2014-02-24: 50 mL
  Filled 2014-02-24: qty 50

## 2014-02-24 MED ORDER — DEXTROSE-NACL 5-0.45 % IV SOLN
INTRAVENOUS | Status: DC
  Administered 2014-02-24: 75 mL/h via INTRAVENOUS
  Administered 2014-02-25 – 2014-02-27 (×3): via INTRAVENOUS

## 2014-02-24 MED ORDER — LORAZEPAM 2 MG/ML IJ SOLN
1.0000 mg | INTRAMUSCULAR | Status: DC | PRN
  Administered 2014-02-24: 2 mg via INTRAVENOUS
  Filled 2014-02-24: qty 1

## 2014-02-24 MED ORDER — DEXMEDETOMIDINE HCL IN NACL 200 MCG/50ML IV SOLN
0.0000 ug/kg/h | INTRAVENOUS | Status: DC
  Administered 2014-02-24: 0.4 ug/kg/h via INTRAVENOUS
  Administered 2014-02-24 (×3): 1.2 ug/kg/h via INTRAVENOUS
  Filled 2014-02-24 (×4): qty 50

## 2014-02-24 MED ORDER — MIDAZOLAM BOLUS VIA INFUSION
1.0000 mg | INTRAVENOUS | Status: DC | PRN
  Filled 2014-02-24: qty 2

## 2014-02-24 MED ORDER — SODIUM CHLORIDE 0.9 % IV SOLN
0.0000 mg/h | INTRAVENOUS | Status: DC
  Administered 2014-02-24: 4 mg/h via INTRAVENOUS
  Administered 2014-02-25: 8 mg/h via INTRAVENOUS
  Administered 2014-02-26: 2 mg/h via INTRAVENOUS
  Administered 2014-02-26 (×2): 8 mg/h via INTRAVENOUS
  Administered 2014-02-26: 2 mg/h via INTRAVENOUS
  Administered 2014-02-27 (×5): 10 mg/h via INTRAVENOUS
  Administered 2014-02-28: 4 mg/h via INTRAVENOUS
  Administered 2014-02-28: 10 mg/h via INTRAVENOUS
  Filled 2014-02-24 (×14): qty 10

## 2014-02-24 MED ORDER — PNEUMOCOCCAL VAC POLYVALENT 25 MCG/0.5ML IJ INJ
0.5000 mL | INJECTION | INTRAMUSCULAR | Status: AC
  Administered 2014-02-25: 0.5 mL via INTRAMUSCULAR
  Filled 2014-02-24: qty 0.5

## 2014-02-24 MED ORDER — DEXTROSE 50 % IV SOLN: 50.0000 mL | Freq: Once | INTRAVENOUS | Status: AC | PRN

## 2014-02-24 NOTE — Progress Notes (Addendum)
PULMONARY / CRITICAL CARE MEDICINE   Name: [name] MRN: [id number] DOB: 11/05/1968    ADMISSION DATE:  02/20/2014  REFERRING MD :  Dr. Deretha Emory  CHIEF COMPLAINT:  Extreme agitation and low blood sugar  INITIAL PRESENTATION:  48 yo female with altered mental status from hypoglycemia.  She has hx of DM and seizures.  Intubated for airway protection.   has a past medical history of Diabetes mellitus without complication.   has past surgical history that includes Tracheostomy and Appendectomy.   STUDIES:  CT head: No acute intracranial abnormality. Mild sinusitis  SIGNIFICANT EVENTS: Intubated 02/20/14 Extubated 02/21/14 Reintubated 8/8 for stridor. Difficult intubation. 6.5 tube. 02/22/14: Sedated on vent 02/23/14: Overnight agitated and attempting to self extubated. Now deeply sedated with fent and diprivan gtt. Fever curve coming down   SUBJECTIVE/OVERNIGHT/INTERVAL HX 02/24/14: still very agitated on WUA  VITAL SIGNS: Temp:  [97 F (36.1 C)-97.7 F (36.5 C)] 97.6 F (36.4 C) (08/11 0357) Pulse Rate:  [51-94] 82 (08/11 0800) Resp:  [12-32] 18 (08/11 0800) BP: (87-135)/(49-73) 116/61 mmHg (08/11 0800) SpO2:  [100 %] 100 % (08/11 0800) FiO2 (%):  [30 %] 30 % (08/11 0800) VENTILATOR SETTINGS: Vent Mode:  [-] PRVC FiO2 (%):  [30 %] 30 % Set Rate:  [16 bmp] 16 bmp Vt Set:  [550 mL] 550 mL PEEP:  [5 cmH20] 5 cmH20 Plateau Pressure:  [29 cmH20-36 cmH20] 34 cmH20 INTAKE / OUTPUT:  Intake/Output Summary (Last 24 hours) at 02/24/14 1106 Last data filed at 02/24/14 0900  Gross per 24 hour  Intake 3993.25 ml  Output    920 ml  Net 3073.25 ml    PHYSICAL EXAMINATION: General:  Intubated, sedated, ET tube in mouth, multiple missing teeth Neuro: RASS -3 with sedation gtt but otherwise RASS +3 HEENT:  Atraumatic, no stridor, no enlarged neck LN. Small size et tube Cardiovascular:  RRR, no  murmur Lungs:  Coarse breath sounds bilaterally Abdomen:  Soft ,no guarding,  present bowel sounds,TF at 40 cc/hr Musculoskeletal:  No gross deformities, no edema Skin:  Has multiple scabs and shallow ulcers with mild excoriations on the lower extremities, no frank purulent discharge  LABS:  PULMONARY  Recent Labs Lab 02/20/14 2147 02/21/14 0406 02/21/14 1427  PHART 7.478* 7.476* 7.443  PCO2ART 38.5 35.1 33.9*  PO2ART 491.0* 151.0* 110.0*  HCO3 28.5* 25.8* 23.0  TCO2 30 27 24   O2SAT 100.0 99.0 98.0    CBC  Recent Labs Lab 02/22/14 0230 02/23/14 0215 02/24/14 0418  HGB 9.2* 8.2* 8.0*  HCT 29.4* 25.8* 25.3*  WBC 9.3 9.0 7.1  PLT 172 156 166    COAGULATION No results found for this basename: INR,  in the last 168 hours  CARDIAC  No results found for this basename: TROPONINI,  in the last 168 hours  Recent Labs Lab 02/21/14 1017 02/24/14 0418  PROBNP 81.3 340.7*     CHEMISTRY  Recent Labs Lab 02/20/14 1843 02/20/14 1933 02/21/14 0314 02/22/14 0230 02/23/14 0215 02/24/14 0418  NA 139  --  137 141 139 144  K 4.3  --  3.1* 3.8 3.1* 4.6  CL 101  --  98 106 106 110  CO2 24  --  24 22 19 21   GLUCOSE 146*  --  197* 208* 240* 215*  BUN 18  --  15 16 20 16   CREATININE 0.60  --  0.71 0.76 0.54 0.53  CALCIUM 8.2*  --  8.2* 7.6* 7.8* 7.2*  MG  --  1.7 2.7* 2.1  --  1.6  PHOS  --   --  2.8 1.3*  --  2.7   Estimated Creatinine Clearance: 69.5 ml/min (by C-G formula based on Cr of 0.53).   LIVER  Recent Labs Lab 02/20/14 1843 02/21/14 0314  AST 45* 37  ALT 19 19  ALKPHOS 52 54  BILITOT 0.3 0.5  PROT 7.5 7.6  ALBUMIN 3.4* 3.4*     INFECTIOUS  Recent Labs Lab 02/20/14 2026 02/23/14 1035 02/24/14 0418 02/24/14 0423  LATICACIDVEN 1.03  --   --  1.0  PROCALCITON  --  0.82 0.70  --      ENDOCRINE CBG (last 3)   Recent Labs  02/24/14 0355 02/24/14 0421 02/24/14 0806  GLUCAP 35* 208* 185*         IMAGING x48h Dg Chest Port 1 View  02/23/2014   CLINICAL DATA:  Hypoxia  EXAM: PORTABLE CHEST - 1 VIEW   COMPARISON:  February 21, 2014  FINDINGS: Endotracheal tube tip is 1.2 cm above the carina. Nasogastric tube tip and side port are below the diaphragm. No pneumothorax. There is no edema or consolidation. The heart size and pulmonary vascularity are normal. No adenopathy. No bone lesions.  IMPRESSION: Tube positions as described without pneumothorax. Note that the endotracheal tube is 1.2 cm above the carina. It may need to be withdrawn slightly. No edema or consolidation.   Electronically Signed   By: Bretta BangWilliam  Woodruff M.D.   On: 02/23/2014 07:21    Dg Chest Port 1 View  02/24/2014   CLINICAL DATA:  Intubation, assess endotracheal tube, diabetes  EXAM: PORTABLE CHEST - 1 VIEW  COMPARISON:  Portable exam 0514 hr compared 02/23/2014  FINDINGS: Tip of endotracheal tube projects 2.0 cm above carinal.  Nasogastric tube extends into stomach.  Tip of RIGHT arm PICC line projects over SVC.  Normal heart size, mediastinal contours, and pulmonary vascularity.  Lungs clear.  No pleural effusion or pneumothorax.  IMPRESSION: No acute abnormalities.   Electronically Signed   By: Ulyses SouthwardMark  Boles M.D.   On: 02/24/2014 07:40   Dg Chest Port 1 View  02/23/2014   CLINICAL DATA:  Hypoxia  EXAM: PORTABLE CHEST - 1 VIEW  COMPARISON:  February 21, 2014  FINDINGS: Endotracheal tube tip is 1.2 cm above the carina. Nasogastric tube tip and side port are below the diaphragm. No pneumothorax. There is no edema or consolidation. The heart size and pulmonary vascularity are normal. No adenopathy. No bone lesions.  IMPRESSION: Tube positions as described without pneumothorax. Note that the endotracheal tube is 1.2 cm above the carina. It may need to be withdrawn slightly. No edema or consolidation.   Electronically Signed   By: Bretta BangWilliam  Woodruff M.D.   On: 02/23/2014 07:21      ASSESSMENT / PLAN:  PULMONARY A:  intubated due to extreme combativeness, cannot rule out aspiration pneumonia for now. Extubated 8/8 and promptly reintubated  with stridor. Difficult intubation. Cords edematous. S.p 2 doses decadron 02/22/14   - does not meet sbt criteria due to agitation  P:   Vent bundle Careful with future extubation.   CARDIOVASCULAR PICC 02/23/14 ordered >> A:  Transient tachycardia likely from stress P:  monitor  RENAL A:   No acute issue other than mild low mag P:   Replete mag IVF maintenance, monitor I/O, avoid nephrotoxic meds  GASTROINTESTINAL A:   Nutrition P:   TF   HEMATOLOGIC A:   Anemia of critical illness P:  monitor PRBBC for hgb <7gm%   INFECTIOUS A:   Possible aspiration PNA.   - culture negative as of 02/24/14   P:   BCx2 >> UC >> Sputum>>  Abx:  Unasyn 8/7 >>>8/11 - stop abx   ENDOCRINE CBG (last 3)   Recent Labs  02/24/14 0355 02/24/14 0421 02/24/14 0806  GLUCAP 35* 208* 185*     A:   Low blood sugar, known DM on insulin(glucose now 166 8/9)   - Sugar 38mg % after lantus last night P:   SSI Cut down lantus from 15 U to 5U to avoid hypoglycemia Careful if given steroids  NEUROLOGIC A:   Acute encephalopathy 2nd to hypoglycemia >> resolved 8/08.   -ongoing agitation 02/23/14  And 02/24/14 during WUA . CK 400 on 8/11 and D4 diprivan P:   Monitor mental status; RASS goal 0 to -2 [place] consult 02/25/14 if delirium persists Fent gtt to continue Change diprivan due to high CK -> precedex Ativan prn If she fails on precedex, start benzo  TODAY'S SUMMARY: Failed extubation 8/8 with severe stridor. 8/9 decadron x 2 doses. No family at bedside 02/23/14 and 02/24/14. Per RN daughter and mom call in but do not seem to have good ICU literacy     The patient is critically ill with multiple organ systems failure and requires high complexity decision making for assessment and support, frequent evaluation and titration of therapies, application of advanced monitoring technologies and extensive interpretation of multiple databases.   Critical Care Time devoted to  patient care services described in this note is  30  Minutes.  Dr. Kalman Shan, M.D., [facility].C.P Pulmonary and Critical Care Medicine Staff Physician [facility] [place] [facility]monary and Critical Care Pager: [phone], If no answer or between  15:00h - 7:00h: call [phone]  02/24/2014 11:06 AM

## 2014-02-24 NOTE — Progress Notes (Signed)
Patient still having high residuals 650mL. Per MD Craige CottaSood will hold tube feedings tonight and reassess in the morning.  [name], [name]

## 2014-02-24 NOTE — Progress Notes (Signed)
Hypoglycemic Event  CBG: 35  Treatment: D50 amp  Symptoms: combative  Follow-up CBG: ZOXW:9604Time:0422 CBG Result:208  Possible Reasons for Event: new insulin orders  Comments/MD notified:    Doree AlbeeSperry, Lashea Goda Rosanne  Remember to initiate Hypoglycemia Order Set & complete

## 2014-02-24 NOTE — Progress Notes (Signed)
Inpatient Diabetes Program Recommendations  AACE/ADA: New Consensus Statement on Inpatient Glycemic Control (2013)  Target Ranges:  Prepandial:   less than 140 mg/dL      Peak postprandial:   less than 180 mg/dL (1-2 hours)      Critically ill patients:  140 - 180 mg/dL   Reason for Assessment:  Hypoglycemia/Hyperglycemia Results for [name], [name] (MRN [id number]) as of 02/24/2014 11:51  Ref. Range 02/23/2014 23:29 02/24/2014 03:52 02/24/2014 03:55 02/24/2014 04:21 02/24/2014 08:06  Glucose-Capillary Latest Range: 70-99 mg/dL 956147 (H) 34 (LL) 35 (LL) 208 (H) 185 (H)    Please consider stopping Novolog meal coverage 4 units tid with meals due to hypoglycemia.  Agree with decrease of Lantus to 5 units q HS.    Beryl MeagerJenny Keila Turan, RN, BC-ADM Inpatient Diabetes Coordinator Pager [phone]

## 2014-02-24 NOTE — Progress Notes (Signed)
eLink Physician-Brief Progress Note Patient Name: Nicole MiloLenora [name] DOB: 11/05/1968 MRN: [id number]  Date of Service  02/24/2014   HPI/Events of Note   Pt agitated.  eICU Interventions   Will start benzo gtt.   Intervention Category Major Interventions: Other:  Brycin Kille 02/24/2014, 8:32 PM

## 2014-02-24 NOTE — Progress Notes (Signed)
Notified Dr. Craige CottaSood that the pt's residuals had been increasing throughout the day tube feeding residuals increased from 245 to 550. CBGs dropping from 180 to 80 throughout the day. Orders to decrease the rate and begin a dextrose gtt were given. Will continue to monitor.

## 2014-02-24 NOTE — Progress Notes (Signed)
eLink Physician-Brief Progress Note Patient Name: Nicole MiloLenora Pinnix DOB: 11/05/1968 MRN: [id number]  Date of Service  02/24/2014   HPI/Events of Note   Pt with increased gastric residuals on tube feeds.  Has borderline low blood sugar.   eICU Interventions   Will decrease tube feed rate to 10 ml/hr.  Will change IV fluid to D5 1/2 NS at 75 ml/hr.    Intervention Category Major Interventions: Other:  Taesha Goodell 02/24/2014, 6:22 PM

## 2014-02-24 NOTE — Clinical Social Work Psychosocial (Signed)
Clinical Social Work Department BRIEF PSYCHOSOCIAL ASSESSMENT 02/24/2014  Patient:  [name],[name]     Account Number:  [id number]     Admit date:  02/20/2014  Clinical Social Worker:  Mosie EpsteinVAUGHN,Yamir Carignan S, LCSWA  Date/Time:  02/24/2014 04:28 PM  Referred by:  Physician  Date Referred:  02/24/2014 Referred for  SNF Placement   Other Referral:   none.   Interview type:  Family Other interview type:   CSW spoke with pt's mother, Corrie DandyMary [phone]).    PSYCHOSOCIAL DATA Living Status:  ALONE Admitted from facility:   Level of care:   Primary support name:  Mary Primary support relationship to patient:  PARENT Degree of support available:   Strong support system.    CURRENT CONCERNS Current Concerns  Post-Acute Placement   Other Concerns:   none.    SOCIAL WORK ASSESSMENT / PLAN CSW received call from pt's RN stating pt's mother, Corrie DandyMary, requesting to speak with CSW. CSW contacted pt's mother regarding request. Pt's mother expressed concern for pt's discharge disposition once medically stable for discharge. Pt's mother stated pt "bounces around" with living situations and often time "ends up sick" and readmitted into the hospital. Pt's mother requesting SNF placement at time of discharge. CSW offered support to pt's mother and discussed placement process. Pt's mother understanding of need to await discharge disposition determination [facility].    CSW to continue to follow and assist with discharge planning as pt becomes more medically stable and discharge disposition determined.   Assessment/plan status:  Psychosocial Support/Ongoing Assessment of Needs Other assessment/ plan:   none.   Information/referral to community resources:   Discharge disposition deferred.    PATIENT'S/FAMILY'S RESPONSE TO PLAN OF CARE: Pt's mother understanding and agreeable to CSW plan of care. Pt's mother expressed no further questions or concerns at this time. Pt's mother expressed  gratitude for CSW providing support.       Marcelline DeistEmily Hava Massingale, MSW, [facility] CenterCSWA Licensed Clinical Social Worker [phone] and [phone] [phone]

## 2014-02-25 ENCOUNTER — Inpatient Hospital Stay (HOSPITAL_COMMUNITY): Payer: Medicaid Other

## 2014-02-25 LAB — BASIC METABOLIC PANEL
ANION GAP: 12 (ref 5–15)
BUN: 13 mg/dL (ref 6–23)
CHLORIDE: 111 meq/L (ref 96–112)
CO2: 19 meq/L (ref 19–32)
CREATININE: 0.57 mg/dL (ref 0.50–1.10)
Calcium: 7 mg/dL — ABNORMAL LOW (ref 8.4–10.5)
GFR calc Af Amer: >90 mL/min (ref >90)
GFR calc non Af Amer: >90 mL/min (ref >90)
Glucose, Bld: 173 mg/dL — ABNORMAL HIGH (ref 70–99)
Potassium: 3.2 mEq/L — ABNORMAL LOW (ref 3.7–5.3)
Sodium: 142 mEq/L (ref 137–147)

## 2014-02-25 LAB — GLUCOSE, CAPILLARY
GLUCOSE-CAPILLARY: 140 mg/dL — AB (ref 70–99)
GLUCOSE-CAPILLARY: 70 mg/dL (ref 70–99)
GLUCOSE-CAPILLARY: 167 mg/dL — AB (ref 70–99)
Glucose-Capillary: 140 mg/dL — ABNORMAL HIGH (ref 70–99)
Glucose-Capillary: 176 mg/dL — ABNORMAL HIGH (ref 70–99)
Glucose-Capillary: 86 mg/dL (ref 70–99)
Glucose-Capillary: 87 mg/dL (ref 70–99)

## 2014-02-25 LAB — CBC WITH DIFFERENTIAL/PLATELET
Basophils Absolute: 0 10*3/uL (ref 0.0–0.1)
Basophils Relative: 0 % (ref 0–1)
EOS PCT: 2 % (ref 0–5)
Eosinophils Absolute: 0.1 10*3/uL (ref 0.0–0.7)
HEMATOCRIT: 23.4 % — AB (ref 36.0–46.0)
HEMOGLOBIN: 7.5 g/dL — AB (ref 12.0–15.0)
Lymphocytes Relative: 44 % (ref 12–46)
Lymphs Abs: 2 10*3/uL (ref 0.7–4.0)
MCH: 20.1 pg — ABNORMAL LOW (ref 26.0–34.0)
MCHC: 32.1 g/dL (ref 30.0–36.0)
MCV: 62.7 fL — AB (ref 78.0–100.0)
MONOS PCT: 12 % (ref 3–12)
Monocytes Absolute: 0.5 10*3/uL (ref 0.1–1.0)
NEUTROS ABS: 1.9 10*3/uL (ref 1.7–7.7)
Neutrophils Relative %: 42 % — ABNORMAL LOW (ref 43–77)
Platelets: 205 10*3/uL (ref 150–400)
RBC: 3.73 MIL/uL — AB (ref 3.87–5.11)
RDW: 15.6 % — ABNORMAL HIGH (ref 11.5–15.5)
WBC: 4.5 10*3/uL (ref 4.0–10.5)

## 2014-02-25 LAB — MAGNESIUM: Magnesium: 1.8 mg/dL (ref 1.5–2.5)

## 2014-02-25 LAB — CK TOTAL AND CKMB (NOT AT ARMC)
CK TOTAL: 658 U/L — AB (ref 7–177)
CK, MB: 7.3 ng/mL (ref 0.3–4.0)
Relative Index: 1.1 (ref 0.0–2.5)

## 2014-02-25 LAB — TROPONIN I: Troponin I: <0.3 ng/mL (ref <0.30)

## 2014-02-25 LAB — PHOSPHORUS: Phosphorus: 2.8 mg/dL (ref 2.3–4.6)

## 2014-02-25 LAB — PROCALCITONIN: Procalcitonin: 0.29 ng/mL

## 2014-02-25 MED ORDER — MAGNESIUM SULFATE IN D5W 10-5 MG/ML-% IV SOLN
1.0000 g | Freq: Once | INTRAVENOUS | Status: AC
  Administered 2014-02-25: 1 g via INTRAVENOUS
  Filled 2014-02-25: qty 100

## 2014-02-25 MED ORDER — SODIUM CHLORIDE 0.9 % IV BOLUS (SEPSIS)
1000.0000 mL | Freq: Once | INTRAVENOUS | Status: AC
  Administered 2014-02-25: 1000 mL via INTRAVENOUS

## 2014-02-25 MED ORDER — PROPOFOL 10 MG/ML IV EMUL
5.0000 ug/kg/min | INTRAVENOUS | Status: DC
  Administered 2014-02-25: 50 ug/kg/min via INTRAVENOUS
  Administered 2014-02-25: 60 ug/kg/min via INTRAVENOUS
  Administered 2014-02-25: 20 ug/kg/min via INTRAVENOUS
  Administered 2014-02-26: 70 ug/kg/min via INTRAVENOUS
  Administered 2014-02-26 (×2): 60 ug/kg/min via INTRAVENOUS
  Filled 2014-02-25 (×5): qty 100

## 2014-02-25 MED ORDER — PROPOFOL 10 MG/ML IV EMUL
INTRAVENOUS | Status: AC
  Administered 2014-02-25: 20 ug/kg/min via INTRAVENOUS
  Filled 2014-02-25: qty 100

## 2014-02-25 MED ORDER — SENNOSIDES-DOCUSATE SODIUM 8.6-50 MG PO TABS
2.0000 | ORAL_TABLET | Freq: Two times a day (BID) | ORAL | Status: DC
  Administered 2014-02-25 – 2014-03-03 (×13): 2
  Filled 2014-02-25 (×14): qty 2

## 2014-02-25 MED ORDER — POTASSIUM CHLORIDE 10 MEQ/50ML IV SOLN
10.0000 meq | INTRAVENOUS | Status: AC
  Administered 2014-02-25 (×4): 10 meq via INTRAVENOUS
  Filled 2014-02-25: qty 50

## 2014-02-25 NOTE — Progress Notes (Signed)
CK climbing despite stopping diprivan x 24h. Now back on diprivan - only agent that controls agitation  Plan Continue diprivan but give fluids to wash out ck Recheck ck in 24h; if still rising stop diprivan  Dr. Kalman ShanMurali Daana Petrasek, M.D., [facility].C.C.P Pulmonary and Critical Care Medicine Staff Physician [facility] [place] Pulmonary and Critical Care Pager: [phone], If no answer or between  15:00h - 7:00h: call [phone]  02/25/2014 11:29 AM

## 2014-02-25 NOTE — Progress Notes (Signed)
Chaplain met with Ms. Mary, the mother of the patient. Ms.Mary expressed guilt about "not stopping her," she couldn't "control her". Ms. Stanton Kidney disclosed that the pt has had diabetes since 48yrs old and wasn't very good at managing it. Mom says that pt is "always running, in and out from place to place," she doesn't live with the mother and the mother could not stop her from running so much. Mother very distressed and tears were falling the duration of the visit. Mom is emphatic about her daughter not "giving up the fight" and asked for prayer for her own strength and her daughters healing. Healing for mom looks like "my daughter sitting up and going home," when I inquired about rather it was a realistic hope in conversation with HCPs, no response was given. Chaplain engaged in empathic listening and prayer. Mother was grateful for the visit and would call for Chaplain when desired.  Delford Field

## 2014-02-25 NOTE — Progress Notes (Signed)
Mom showed up and we discussed  - Mom feeling guilty about upbrining of patient and in tears  - patient lives alone, Mom unsuer about substance abuse but says never pays rent and in and out of hospital and gets kicked out by landlord  - Expressed - full code including trach and ltach if needed. Definitely wants patient placed. Not ready to give up. Does not feel patient at end of life   Dr. Kalman ShanMurali Ruchel Brandenburger, M.D., [facility].C.P Pulmonary and Critical Care Medicine Staff Physician [facility] Pulmonary and Critical Care Pager: [phone], If no answer or between  15:00h - 7:00h: call [phone]  02/25/2014 10:30 AM

## 2014-02-25 NOTE — Consult Note (Signed)
Referring Physician: Dr. Chase Caller    Chief Complaint: agitated delirium  HPI:                                                                                                                                         [name] is an 48 y.o. female with a past medical history significant for DM, seizures, admitted 8/7 with severe hypoglycemia and altered mental status. Patient lives by herself. Hypoglycemia resolved but she has remained agitated requiring use of propofol. Initial CT brain showed no acute abnormality and UDS was positive only for benzodiazepines. Serologies unimpressive so far. At this moment, she is intubated on the vent.  Date last known well: unclear  Time last known well: unclear tPA Given: no, presentation was ost consistent with encephalopathy and unclear time of onset   Past Medical History  Diagnosis Date  . Diabetes mellitus without complication     Past Surgical History  Procedure Laterality Date  . Tracheostomy    . Appendectomy      History reviewed. No pertinent family history. Social History:  reports that she has quit smoking. She does not have any smokeless tobacco history on file. She reports that she does not drink alcohol or use illicit drugs.  Allergies: No Known Allergies  Medications:                                                                                                                           Scheduled: . antiseptic oral rinse  7 mL Mouth Rinse QID  . chlorhexidine  15 mL Mouth Rinse BID  . famotidine  20 mg Oral QHS  . feeding supplement (VITAL HIGH PROTEIN)  1,000 mL Per Tube Q24H  . heparin  5,000 Units Subcutaneous 3 times per day  . insulin aspart  2-6 Units Subcutaneous 6 times per day  . insulin aspart  4 Units Subcutaneous 3 times per day  . insulin glargine  5 Units Subcutaneous QHS  . potassium chloride  10 mEq Intravenous Q1 Hr x 4  . potassium chloride  10 mEq Intravenous Q1 Hr x 4  . senna-docusate  2 tablet  Per Tube BID  . sodium chloride  1,000 mL Intravenous Once  . sodium chloride  10-40 mL Intracatheter Q12H    ROS: unable to obtain due to mental status.  History obtained from chart review  Physical exam: intubated on the vent. Blood pressure 91/60, pulse 65, temperature 97.6 F (36.4 C), temperature source Oral, resp. rate 16, height $RemoveBe'5\' 2"'FGBVlORHH$  (1.575 m), weight 57.1 kg (125 lb 14.1 oz), last menstrual period 02/05/2014, SpO2 100.00%. Head: normocephalic. Neck: supple, no bruits, no JVD. Cardiac: no murmurs. Lungs: clear. Abdomen: soft, no tender, no mass. Extremities: no edema. Neurologic Examination:                                                                                                      Propofol was turned off for this assessment. Mental status: open eyes to painful stimuli but does not follow commands. CN 2-12: irregularly asymmetric pupils (prior surgery?), reactive. Seem to have a right gaze preference. Face symmetric. Tongue: intubated. Motor: moves the left side and left leg upon noxious stimuli but pain does not provoke movements left arm. Sensory: reacts to pain. DTR's: 1+ all over. Plantars: downgoing,. Coordination and gait: unable to test.   Results for orders placed during the hospital encounter of 02/20/14 (from the past 48 hour(s))  GLUCOSE, CAPILLARY     Status: Abnormal   Collection Time    02/23/14  1:04 PM      Result Value Ref Range   Glucose-Capillary 257 (*) 70 - 99 mg/dL  GLUCOSE, CAPILLARY     Status: Abnormal   Collection Time    02/23/14  4:40 PM      Result Value Ref Range   Glucose-Capillary 272 (*) 70 - 99 mg/dL  GLUCOSE, CAPILLARY     Status: Abnormal   Collection Time    02/23/14  7:49 PM      Result Value Ref Range   Glucose-Capillary 236 (*) 70 - 99 mg/dL  GLUCOSE, CAPILLARY     Status: Abnormal   Collection Time     02/23/14 11:29 PM      Result Value Ref Range   Glucose-Capillary 147 (*) 70 - 99 mg/dL  GLUCOSE, CAPILLARY     Status: Abnormal   Collection Time    02/24/14  3:52 AM      Result Value Ref Range   Glucose-Capillary 34 (*) 70 - 99 mg/dL   Comment 1 Repeat Test    GLUCOSE, CAPILLARY     Status: Abnormal   Collection Time    02/24/14  3:55 AM      Result Value Ref Range   Glucose-Capillary 35 (*) 70 - 99 mg/dL   Comment 1 Notify RN    MAGNESIUM     Status: None   Collection Time    02/24/14  4:18 AM      Result Value Ref Range   Magnesium 1.6  1.5 - 2.5 mg/dL  PHOSPHORUS     Status: None   Collection Time    02/24/14  4:18 AM      Result Value Ref Range   Phosphorus 2.7  2.3 - 4.6 mg/dL  CBC WITH DIFFERENTIAL     Status: Abnormal   Collection Time    02/24/14  4:18 AM  Result Value Ref Range   WBC 7.1  4.0 - 10.5 K/uL   RBC 3.97  3.87 - 5.11 MIL/uL   Hemoglobin 8.0 (*) 12.0 - 15.0 g/dL   HCT 25.3 (*) 36.0 - 46.0 %   MCV 63.7 (*) 78.0 - 100.0 fL   MCH 20.2 (*) 26.0 - 34.0 pg   MCHC 31.6  30.0 - 36.0 g/dL   RDW 16.0 (*) 11.5 - 15.5 %   Platelets 166  150 - 400 K/uL   Neutrophils Relative % 64  43 - 77 %   Lymphocytes Relative 27  12 - 46 %   Monocytes Relative 8  3 - 12 %   Eosinophils Relative 1  0 - 5 %   Basophils Relative 0  0 - 1 %   Neutro Abs 4.5  1.7 - 7.7 K/uL   Lymphs Abs 1.9  0.7 - 4.0 K/uL   Monocytes Absolute 0.6  0.1 - 1.0 K/uL   Eosinophils Absolute 0.1  0.0 - 0.7 K/uL   Basophils Absolute 0.0  0.0 - 0.1 K/uL   RBC Morphology ELLIPTOCYTES     Comment: CRENATED RBCs     TARGET CELLS  BASIC METABOLIC PANEL     Status: Abnormal   Collection Time    02/24/14  4:18 AM      Result Value Ref Range   Sodium 144  137 - 147 mEq/L   Potassium 4.6  3.7 - 5.3 mEq/L   Comment: DELTA CHECK NOTED   Chloride 110  96 - 112 mEq/L   CO2 21  19 - 32 mEq/L   Glucose, Bld 215 (*) 70 - 99 mg/dL   BUN 16  6 - 23 mg/dL   Creatinine, Ser 0.53  0.50 - 1.10 mg/dL    Calcium 7.2 (*) 8.4 - 10.5 mg/dL   GFR calc non Af Amer >90  >90 mL/min   GFR calc Af Amer >90  >90 mL/min   Comment: (NOTE)     The eGFR has been calculated using the CKD EPI equation.     This calculation has not been validated in all clinical situations.     eGFR's persistently <90 mL/min signify possible Chronic Kidney     Disease.   Anion gap 13  5 - 15  CK TOTAL AND CKMB     Status: Abnormal   Collection Time    02/24/14  4:18 AM      Result Value Ref Range   Total CK 398 (*) 7 - 177 U/L   CK, MB 4.8 (*) 0.3 - 4.0 ng/mL   Relative Index 1.2  0.0 - 2.5  PRO B NATRIURETIC PEPTIDE     Status: Abnormal   Collection Time    02/24/14  4:18 AM      Result Value Ref Range   Pro B Natriuretic peptide (BNP) 340.7 (*) 0 - 125 pg/mL  PROCALCITONIN     Status: None   Collection Time    02/24/14  4:18 AM      Result Value Ref Range   Procalcitonin 0.70     Comment:            Interpretation:     PCT > 0.5 ng/mL and <= 2 ng/mL:     Systemic infection (sepsis) is possible,     but other conditions are known to elevate     PCT as well.     (NOTE)  ICU PCT Algorithm               Non ICU PCT Algorithm        ----------------------------     ------------------------------             PCT < 0.25 ng/mL                 PCT < 0.1 ng/mL         Stopping of antibiotics            Stopping of antibiotics           strongly encouraged.               strongly encouraged.        ----------------------------     ------------------------------           PCT level decrease by               PCT < 0.25 ng/mL           >= 80% from peak PCT           OR PCT 0.25 - 0.5 ng/mL          Stopping of antibiotics                                                 encouraged.         Stopping of antibiotics               encouraged.        ----------------------------     ------------------------------           PCT level decrease by              PCT >= 0.25 ng/mL           < 80% from peak PCT             AND PCT >= 0.5 ng/mL            Continuing antibiotics                                                  encouraged.           Continuing antibiotics                encouraged.        ----------------------------     ------------------------------         PCT level increase compared          PCT > 0.5 ng/mL             with peak PCT AND              PCT >= 0.5 ng/mL             Escalation of antibiotics                                              strongly encouraged.          Escalation of antibiotics  strongly encouraged.  GLUCOSE, CAPILLARY     Status: Abnormal   Collection Time    02/24/14  4:21 AM      Result Value Ref Range   Glucose-Capillary 208 (*) 70 - 99 mg/dL  LACTIC ACID, PLASMA     Status: None   Collection Time    02/24/14  4:23 AM      Result Value Ref Range   Lactic Acid, Venous 1.0  0.5 - 2.2 mmol/L  GLUCOSE, CAPILLARY     Status: Abnormal   Collection Time    02/24/14  8:06 AM      Result Value Ref Range   Glucose-Capillary 185 (*) 70 - 99 mg/dL  GLUCOSE, CAPILLARY     Status: None   Collection Time    02/24/14 12:37 PM      Result Value Ref Range   Glucose-Capillary 94  70 - 99 mg/dL  GLUCOSE, CAPILLARY     Status: None   Collection Time    02/24/14  3:38 PM      Result Value Ref Range   Glucose-Capillary 80  70 - 99 mg/dL  GLUCOSE, CAPILLARY     Status: Abnormal   Collection Time    02/24/14  7:44 PM      Result Value Ref Range   Glucose-Capillary 102 (*) 70 - 99 mg/dL  GLUCOSE, CAPILLARY     Status: Abnormal   Collection Time    02/24/14 11:37 PM      Result Value Ref Range   Glucose-Capillary 140 (*) 70 - 99 mg/dL   Comment 1 Notify RN    GLUCOSE, CAPILLARY     Status: None   Collection Time    02/25/14  3:51 AM      Result Value Ref Range   Glucose-Capillary 87  70 - 99 mg/dL  MAGNESIUM     Status: None   Collection Time    02/25/14  4:11 AM      Result Value Ref Range   Magnesium 1.8  1.5 - 2.5 mg/dL  PHOSPHORUS      Status: None   Collection Time    02/25/14  4:11 AM      Result Value Ref Range   Phosphorus 2.8  2.3 - 4.6 mg/dL  CBC WITH DIFFERENTIAL     Status: Abnormal   Collection Time    02/25/14  4:11 AM      Result Value Ref Range   WBC 4.5  4.0 - 10.5 K/uL   RBC 3.73 (*) 3.87 - 5.11 MIL/uL   Hemoglobin 7.5 (*) 12.0 - 15.0 g/dL   HCT 52.4 (*) 84.9 - 48.3 %   MCV 62.7 (*) 78.0 - 100.0 fL   MCH 20.1 (*) 26.0 - 34.0 pg   MCHC 32.1  30.0 - 36.0 g/dL   RDW 55.9 (*) 97.6 - 82.3 %   Platelets 205  150 - 400 K/uL   Comment: REPEATED TO VERIFY     PLATELET COUNT CONFIRMED BY SMEAR   Neutrophils Relative % 42 (*) 43 - 77 %   Lymphocytes Relative 44  12 - 46 %   Monocytes Relative 12  3 - 12 %   Eosinophils Relative 2  0 - 5 %   Basophils Relative 0  0 - 1 %   Neutro Abs 1.9  1.7 - 7.7 K/uL   Lymphs Abs 2.0  0.7 - 4.0 K/uL   Monocytes Absolute 0.5  0.1 - 1.0 K/uL   Eosinophils  Absolute 0.1  0.0 - 0.7 K/uL   Basophils Absolute 0.0  0.0 - 0.1 K/uL   Smear Review MORPHOLOGY UNREMARKABLE    BASIC METABOLIC PANEL     Status: Abnormal   Collection Time    02/25/14  4:11 AM      Result Value Ref Range   Sodium 142  137 - 147 mEq/L   Potassium 3.2 (*) 3.7 - 5.3 mEq/L   Comment: DELTA CHECK NOTED   Chloride 111  96 - 112 mEq/L   CO2 19  19 - 32 mEq/L   Glucose, Bld 173 (*) 70 - 99 mg/dL   BUN 13  6 - 23 mg/dL   Creatinine, Ser 0.57  0.50 - 1.10 mg/dL   Calcium 7.0 (*) 8.4 - 10.5 mg/dL   GFR calc non Af Amer >90  >90 mL/min   GFR calc Af Amer >90  >90 mL/min   Comment: (NOTE)     The eGFR has been calculated using the CKD EPI equation.     This calculation has not been validated in all clinical situations.     eGFR's persistently <90 mL/min signify possible Chronic Kidney     Disease.   Anion gap 12  5 - 15  PROCALCITONIN     Status: None   Collection Time    02/25/14  4:11 AM      Result Value Ref Range   Procalcitonin 0.29     Comment:            Interpretation:     PCT  (Procalcitonin) <= 0.5 ng/mL:     Systemic infection (sepsis) is not likely.     Local bacterial infection is possible.     (NOTE)             ICU PCT Algorithm               Non ICU PCT Algorithm        ----------------------------     ------------------------------             PCT < 0.25 ng/mL                 PCT < 0.1 ng/mL         Stopping of antibiotics            Stopping of antibiotics           strongly encouraged.               strongly encouraged.        ----------------------------     ------------------------------           PCT level decrease by               PCT < 0.25 ng/mL           >= 80% from peak PCT           OR PCT 0.25 - 0.5 ng/mL          Stopping of antibiotics                                                 encouraged.         Stopping of antibiotics               encouraged.        ----------------------------     ------------------------------  PCT level decrease by              PCT >= 0.25 ng/mL           < 80% from peak PCT            AND PCT >= 0.5 ng/mL            Continuing antibiotics                                                  encouraged.           Continuing antibiotics                encouraged.        ----------------------------     ------------------------------         PCT level increase compared          PCT > 0.5 ng/mL             with peak PCT AND              PCT >= 0.5 ng/mL             Escalation of antibiotics                                              strongly encouraged.          Escalation of antibiotics            strongly encouraged.  TROPONIN I     Status: None   Collection Time    02/25/14  4:13 AM      Result Value Ref Range   Troponin I <0.30  <0.30 ng/mL   Comment:            Due to the release kinetics of cTnI,     a negative result within the first hours     of the onset of symptoms does not rule out     myocardial infarction with certainty.     If myocardial infarction is still suspected,     repeat  the test at appropriate intervals.  CK TOTAL AND CKMB     Status: Abnormal   Collection Time    02/25/14  4:13 AM      Result Value Ref Range   Total CK 658 (*) 7 - 177 U/L   CK, MB 7.3 (*) 0.3 - 4.0 ng/mL   Comment: CRITICAL RESULT CALLED TO, READ BACK BY AND VERIFIED WITH:     HUNT K RN 02/25/14 1018 COSTELLO B   Relative Index 1.1  0.0 - 2.5  GLUCOSE, CAPILLARY     Status: Abnormal   Collection Time    02/25/14  8:07 AM      Result Value Ref Range   Glucose-Capillary 140 (*) 70 - 99 mg/dL   Dg Chest Port 1 View  02/25/2014   CLINICAL DATA:  Endotracheal tube.  EXAM: PORTABLE CHEST - 1 VIEW  COMPARISON:  02/24/2014  FINDINGS: Endotracheal tube remains in place with tip approximately 2 cm above the carina. Right PICC remains with tip overlying the mid SVC, unchanged. Enteric tube courses into the left upper abdomen with side hole projecting over the  gastric body and tip not imaged. Cardiomediastinal silhouette is within normal limits. There is no airspace consolidation, edema, pleural effusion, or pneumothorax.  IMPRESSION: Unchanged support apparatus.  No evidence of acute airspace disease.   Electronically Signed   By: Logan Bores   On: 02/25/2014 07:29   Dg Chest Port 1 View  02/24/2014   CLINICAL DATA:  Intubation, assess endotracheal tube, diabetes  EXAM: PORTABLE CHEST - 1 VIEW  COMPARISON:  Portable exam 0514 hr compared 02/23/2014  FINDINGS: Tip of endotracheal tube projects 2.0 cm above carinal.  Nasogastric tube extends into stomach.  Tip of RIGHT arm PICC line projects over SVC.  Normal heart size, mediastinal contours, and pulmonary vascularity.  Lungs clear.  No pleural effusion or pneumothorax.  IMPRESSION: No acute abnormalities.   Electronically Signed   By: Lavonia Dana M.D.   On: 02/24/2014 07:40    Assessment: 48 y.o. female admitted to [facility] due to severe hypoglycemic encephalopathy and persistent agitated delirium despite correction of metabolic derangement. Neuro-exam shows  ? right gaze preference. Although most likely hyperactive delirium, the questionable right gaze preference warrants obtaining further neuro-imaging to ruled out right hemispheric stroke. Recommend: 1) MRI brain when feasible. 2) EEG to assess degree of encephalopathy. Will follow up.   Dorian Pod, MD [facility] [phone]  02/25/2014, 12:38 PM

## 2014-02-25 NOTE — Progress Notes (Signed)
EEG completed; results pending.    

## 2014-02-25 NOTE — Progress Notes (Signed)
PULMONARY / CRITICAL CARE MEDICINE   Name: [name] MRN: [id number] DOB: 11/05/1968    ADMISSION DATE:  02/20/2014  REFERRING MD :  Dr. Deretha EmoryZackowski  CHIEF COMPLAINT:  Extreme agitation and low blood sugar  INITIAL PRESENTATION:  48 yo female with altered mental status from hypoglycemia.  She has hx of DM and seizures.  Intubated for airway protection.   has a past medical history of Diabetes mellitus without complication.   has past surgical history that includes Tracheostomy and Appendectomy.   STUDIES:  CT head: No acute intracranial abnormality. Mild sinusitis  SIGNIFICANT EVENTS: Intubated 02/20/14 Extubated 02/21/14 Reintubated 8/8 for stridor. Difficult intubation. 6.5 tube. 02/22/14: Sedated on vent 02/23/14: Overnight agitated and attempting to self extubated. Now deeply sedated with fent and diprivan gtt. Fever curve coming down 02/24/14: still very agitated on WUA; propofil changed to precedex   SUBJECTIVE/OVERNIGHT/INTERVAL HX 02/25/14: [place]orsenig agitation. Fent continued, Precedex changed to versed. This morning needing to go back on diprivan. Also, high tube feed residuals and TF held; this AM seems ok. Off antibiotics x 24h  VITAL SIGNS: Temp:  [97.6 F (36.4 C)-98.5 F (36.9 C)] 97.6 F (36.4 C) (08/12 0829) Pulse Rate:  [57-84] 57 (08/12 0900) Resp:  [16-23] 16 (08/12 0900) BP: (69-161)/(40-88) 116/57 mmHg (08/12 0900) SpO2:  [100 %] 100 % (08/12 0900) FiO2 (%):  [30 %] 30 % (08/12 0829) Weight:  [57.1 kg (125 lb 14.1 oz)] 57.1 kg (125 lb 14.1 oz) (08/12 0500) VENTILATOR SETTINGS: Vent Mode:  [-] PRVC FiO2 (%):  [30 %] 30 % Set Rate:  [16 bmp] 16 bmp Vt Set:  [550 mL] 550 mL PEEP:  [5 cmH20] 5 cmH20 Plateau Pressure:  [32 cmH20-36 cmH20] 33 cmH20 INTAKE / OUTPUT:  Intake/Output Summary (Last 24 hours) at 02/25/14 0935 Last data filed at 02/25/14 0800  Gross per 24 hour  Intake 1857.9 ml  Output    360 ml  Net 1497.9 ml    PHYSICAL  EXAMINATION: General:  Intubated, sedated, ET tube in mouth, multiple missing teeth Neuro: RASS -3 with sedation gtt but otherwise RASS +3 HEENT:  Atraumatic, no stridor, no enlarged neck LN. Small size et tube Cardiovascular:  RRR, no  murmur Lungs:  Coarse breath sounds bilaterally Abdomen:  Soft ,no guarding, present bowel sounds,TF at 40 cc/hr Musculoskeletal:  No gross deformities, no edema Skin:  Has multiple scabs and shallow ulcers with mild excoriations on the lower extremities, no frank purulent discharge  LABS:  PULMONARY  Recent Labs Lab 02/20/14 2147 02/21/14 0406 02/21/14 1427  PHART 7.478* 7.476* 7.443  PCO2ART 38.5 35.1 33.9*  PO2ART 491.0* 151.0* 110.0*  HCO3 28.5* 25.8* 23.0  TCO2 30 27 24   O2SAT 100.0 99.0 98.0    CBC  Recent Labs Lab 02/23/14 0215 02/24/14 0418 02/25/14 0411  HGB 8.2* 8.0* 7.5*  HCT 25.8* 25.3* 23.4*  WBC 9.0 7.1 4.5  PLT 156 166 205    COAGULATION No results found for this basename: INR,  in the last 168 hours  CARDIAC    Recent Labs Lab 02/25/14 0413  TROPONINI <0.30    Recent Labs Lab 02/21/14 1017 02/24/14 0418  PROBNP 81.3 340.7*     CHEMISTRY  Recent Labs Lab 02/20/14 1843 02/20/14 1933 02/21/14 0314 02/22/14 0230 02/23/14 0215 02/24/14 0418 02/25/14 0411  NA 139  --  137 141 139 144 142  K 4.3  --  3.1* 3.8 3.1* 4.6 3.2*  CL 101  --  98 106 106  110 111  CO2 24  --  24 22 19 21 19   GLUCOSE 146*  --  197* 208* 240* 215* 173*  BUN 18  --  15 16 20 16 13   CREATININE 0.60  --  0.71 0.76 0.54 0.53 0.57  CALCIUM 8.2*  --  8.2* 7.6* 7.8* 7.2* 7.0*  MG  --  1.7 2.7* 2.1  --  1.6 1.8  PHOS  --   --  2.8 1.3*  --  2.7 2.8   Estimated Creatinine Clearance: 69.5 ml/min (by C-G formula based on Cr of 0.57).   LIVER  Recent Labs Lab 02/20/14 1843 02/21/14 0314  AST 45* 37  ALT 19 19  ALKPHOS 52 54  BILITOT 0.3 0.5  PROT 7.5 7.6  ALBUMIN 3.4* 3.4*     INFECTIOUS  Recent Labs Lab  02/20/14 2026 02/23/14 1035 02/24/14 0418 02/24/14 0423 02/25/14 0411  LATICACIDVEN 1.03  --   --  1.0  --   PROCALCITON  --  0.82 0.70  --  0.29     ENDOCRINE CBG (last 3)   Recent Labs  02/24/14 2337 02/25/14 0351 02/25/14 0807  GLUCAP 140* 87 140*         IMAGING x48h Dg Chest Port 1 View  02/24/2014   CLINICAL DATA:  Intubation, assess endotracheal tube, diabetes  EXAM: PORTABLE CHEST - 1 VIEW  COMPARISON:  Portable exam 0514 hr compared 02/23/2014  FINDINGS: Tip of endotracheal tube projects 2.0 cm above carinal.  Nasogastric tube extends into stomach.  Tip of RIGHT arm PICC line projects over SVC.  Normal heart size, mediastinal contours, and pulmonary vascularity.  Lungs clear.  No pleural effusion or pneumothorax.  IMPRESSION: No acute abnormalities.   Electronically Signed   By: Ulyses Southward M.D.   On: 02/24/2014 07:40    Dg Chest Port 1 View  02/25/2014   CLINICAL DATA:  Endotracheal tube.  EXAM: PORTABLE CHEST - 1 VIEW  COMPARISON:  02/24/2014  FINDINGS: Endotracheal tube remains in place with tip approximately 2 cm above the carina. Right PICC remains with tip overlying the mid SVC, unchanged. Enteric tube courses into the left upper abdomen with side hole projecting over the gastric body and tip not imaged. Cardiomediastinal silhouette is within normal limits. There is no airspace consolidation, edema, pleural effusion, or pneumothorax.  IMPRESSION: Unchanged support apparatus.  No evidence of acute airspace disease.   Electronically Signed   By: Sebastian Ache   On: 02/25/2014 07:29   Dg Chest Port 1 View  02/24/2014   CLINICAL DATA:  Intubation, assess endotracheal tube, diabetes  EXAM: PORTABLE CHEST - 1 VIEW  COMPARISON:  Portable exam 0514 hr compared 02/23/2014  FINDINGS: Tip of endotracheal tube projects 2.0 cm above carinal.  Nasogastric tube extends into stomach.  Tip of RIGHT arm PICC line projects over SVC.  Normal heart size, mediastinal contours, and  pulmonary vascularity.  Lungs clear.  No pleural effusion or pneumothorax.  IMPRESSION: No acute abnormalities.   Electronically Signed   By: Ulyses Southward M.D.   On: 02/24/2014 07:40      ASSESSMENT / PLAN:  PULMONARY A:  intubated due to extreme combativeness, cannot rule out aspiration pneumonia for now. Extubated 8/8 and promptly reintubated with stridor. Difficult intubation. Cords edematous. S.p 2 doses decadron 02/22/14   - does not meet sbt criteria due to agitation  P:   Vent bundle Careful with future extubation; might need redo trach if unable to liberate off  vent   CARDIOVASCULAR PICC 02/23/14 ordered >> A:  Nil acute. Possible prolonged qtc P:  Monitor   RENAL A:   No acute issue other than mild low mag and mild low k P:   Replete mag and K IVF maintenance, monitor I/O, avoid nephrotoxic meds  GASTROINTESTINAL A:   Nutrition   - TF held 02/24/14 but no residuals 02/25/14 P:   TF restart 02/25/14  HEMATOLOGIC A:   Anemia of critical illness P:  monitor PRBBC for hgb <7gm%   INFECTIOUS A:   Possible aspiration PNA.   - culture negative as of 02/25/14. PCT trend not cw sepsis   P:   BCx2 >> neg UC >>neg Sputum>> neg  Abx:  Unasyn 8/7 >>>8/11 - stop abx   ENDOCRINE CBG (last 3)   Recent Labs  02/24/14 2337 02/25/14 0351 02/25/14 0807  GLUCAP 140* 87 140*     A:   Low blood sugar, known DM on insulin(glucose now 166 8/9)   - Sugars  Again low 8/05/17/14 pm  P:   SSI Cut down lantus from 15 U to 5U to avoid hypoglycemia Careful if given steroids  NEUROLOGIC A:   Acute encephalopathy 2nd to hypoglycemia >> resolved 8/08.   -ongoing agitation 02/23/14  And 02/24/14 during WUA . CK 400 on 8/11 and D4 diprivan  - worsening agitation 02/25/14 after coming off  Diprivan. Responds only to diprivan. Unable to give haldol due to prolonged qtc  P:   Monitor mental status; RASS goal 0 to -2 Nuero consult 02/25/14 ; will call Fent gtt to  continue Restart e dipriva; monitor  CK Versed gtt to continue but wean Ativan prn   GLOBAL 02/25/14: Mom not at bedside but indicated over phone to social worker she wants placemnt  TODAY'S SUMMARY: Failed extubation 8/8 with severe stridor. 8/9 decadron x 2 doses. No family at bedside 02/23/14 and 02/24/14 and 02/25/14. Per RN daughter and mom call in but do not seem to have good ICU literacy and do not indicate desire to talk to MD     The patient is critically ill with multiple organ systems failure and requires high complexity decision making for assessment and support, frequent evaluation and titration of therapies, application of advanced monitoring technologies and extensive interpretation of multiple databases.   Critical Care Time devoted to patient care services described in this note is  30  Minutes.  Dr. Kalman Shan, M.D., [facility].C.P Pulmonary and Critical Care Medicine Staff Physician [facility]  Pulmonary and Critical Care Pager: [phone], If no answer or between  15:00h - 7:00h: call [phone]  02/25/2014 9:35 AM

## 2014-02-25 NOTE — Progress Notes (Signed)
Wasted 40 cc of versed in sink with Deniece ReeMary Neal, RN.

## 2014-02-25 NOTE — Progress Notes (Signed)
Pt unable to tolerate MRI at this time. Discussed with neuro MD, Dr. Leroy Kennedyamilo and plan to readdress tomorrow.

## 2014-02-26 DIAGNOSIS — G9340 Encephalopathy, unspecified: Secondary | ICD-10-CM

## 2014-02-26 LAB — CK TOTAL AND CKMB (NOT AT ARMC)
CK, MB: 18.1 ng/mL (ref 0.3–4.0)
Relative Index: 1.3 (ref 0.0–2.5)
Total CK: 1425 U/L — ABNORMAL HIGH (ref 7–177)

## 2014-02-26 LAB — GLUCOSE, CAPILLARY
GLUCOSE-CAPILLARY: 34 mg/dL — AB (ref 70–99)
GLUCOSE-CAPILLARY: 101 mg/dL — AB (ref 70–99)
GLUCOSE-CAPILLARY: 186 mg/dL — AB (ref 70–99)
Glucose-Capillary: 112 mg/dL — ABNORMAL HIGH (ref 70–99)
Glucose-Capillary: 206 mg/dL — ABNORMAL HIGH (ref 70–99)
Glucose-Capillary: 221 mg/dL — ABNORMAL HIGH (ref 70–99)

## 2014-02-26 LAB — BASIC METABOLIC PANEL
ANION GAP: 10 (ref 5–15)
BUN: 8 mg/dL (ref 6–23)
CALCIUM: 7.4 mg/dL — AB (ref 8.4–10.5)
CO2: 18 mEq/L — ABNORMAL LOW (ref 19–32)
Chloride: 117 mEq/L — ABNORMAL HIGH (ref 96–112)
Creatinine, Ser: 0.54 mg/dL (ref 0.50–1.10)
GFR calc Af Amer: >90 mL/min (ref >90)
GFR calc non Af Amer: >90 mL/min (ref >90)
Glucose, Bld: 95 mg/dL (ref 70–99)
Potassium: 3.7 mEq/L (ref 3.7–5.3)
Sodium: 145 mEq/L (ref 137–147)

## 2014-02-26 LAB — PROTIME-INR
INR: 1.1 (ref 0.00–1.49)
Prothrombin Time: 14.2 seconds (ref 11.6–15.2)

## 2014-02-26 LAB — CBC WITH DIFFERENTIAL/PLATELET
BASOS ABS: 0 10*3/uL (ref 0.0–0.1)
BASOS PCT: 1 % (ref 0–1)
EOS ABS: 0.2 10*3/uL (ref 0.0–0.7)
Eosinophils Relative: 5 % (ref 0–5)
HEMATOCRIT: 24.3 % — AB (ref 36.0–46.0)
Hemoglobin: 7.9 g/dL — ABNORMAL LOW (ref 12.0–15.0)
LYMPHS ABS: 1.3 10*3/uL (ref 0.7–4.0)
Lymphocytes Relative: 29 % (ref 12–46)
MCH: 20.8 pg — ABNORMAL LOW (ref 26.0–34.0)
MCHC: 32.5 g/dL (ref 30.0–36.0)
MCV: 64.1 fL — AB (ref 78.0–100.0)
Monocytes Absolute: 0.6 10*3/uL (ref 0.1–1.0)
Monocytes Relative: 14 % — ABNORMAL HIGH (ref 3–12)
NEUTROS ABS: 2.4 10*3/uL (ref 1.7–7.7)
Neutrophils Relative %: 51 % (ref 43–77)
Platelets: 184 10*3/uL (ref 150–400)
RBC: 3.79 MIL/uL — ABNORMAL LOW (ref 3.87–5.11)
RDW: 16.1 % — AB (ref 11.5–15.5)
WBC: 4.5 10*3/uL (ref 4.0–10.5)

## 2014-02-26 LAB — PHOSPHORUS: Phosphorus: 3.2 mg/dL (ref 2.3–4.6)

## 2014-02-26 LAB — APTT: APTT: 43 s — AB (ref 24–37)

## 2014-02-26 LAB — MAGNESIUM: Magnesium: 1.9 mg/dL (ref 1.5–2.5)

## 2014-02-26 MED ORDER — POTASSIUM CHLORIDE 20 MEQ/15ML (10%) PO LIQD
20.0000 meq | ORAL | Status: AC
  Administered 2014-02-26 (×2): 20 meq
  Filled 2014-02-26 (×2): qty 15

## 2014-02-26 MED ORDER — FAMOTIDINE 40 MG/5ML PO SUSR
20.0000 mg | Freq: Two times a day (BID) | ORAL | Status: DC
  Administered 2014-02-26 – 2014-02-28 (×5): 20 mg via ORAL
  Filled 2014-02-26 (×6): qty 2.5

## 2014-02-26 MED ORDER — FENTANYL CITRATE 0.05 MG/ML IJ SOLN
200.0000 ug | Freq: Once | INTRAMUSCULAR | Status: DC
  Filled 2014-02-26: qty 4

## 2014-02-26 MED ORDER — VECURONIUM BOLUS VIA INFUSION: 0.0800 mg/kg | Freq: Once | INTRAVENOUS | Status: DC

## 2014-02-26 MED ORDER — MIDAZOLAM HCL 2 MG/2ML IJ SOLN: 4.0000 mg | Freq: Once | INTRAMUSCULAR | Status: DC

## 2014-02-26 MED ORDER — PROPOFOL 10 MG/ML IV BOLUS: 0.5000 mg/kg | Freq: Once | INTRAVENOUS | Status: DC

## 2014-02-26 MED ORDER — PROPOFOL 10 MG/ML IV EMUL: 5.0000 ug/kg/min | Freq: Once | INTRAVENOUS | Status: DC

## 2014-02-26 MED ORDER — VECURONIUM BROMIDE 10 MG IV SOLR: 0.0800 mg/kg | Freq: Once | INTRAVENOUS | Status: DC

## 2014-02-26 MED ORDER — MIDAZOLAM HCL 2 MG/2ML IJ SOLN
4.0000 mg | Freq: Once | INTRAMUSCULAR | Status: DC
  Filled 2014-02-26: qty 4

## 2014-02-26 MED ORDER — ETOMIDATE 2 MG/ML IV SOLN
40.0000 mg | Freq: Once | INTRAVENOUS | Status: DC
  Filled 2014-02-26 (×2): qty 20

## 2014-02-26 MED ORDER — ETOMIDATE 2 MG/ML IV SOLN
40.0000 mg | Freq: Once | INTRAVENOUS | Status: DC
  Filled 2014-02-26: qty 20

## 2014-02-26 MED ORDER — MAGNESIUM SULFATE 40 MG/ML IJ SOLN
2.0000 g | Freq: Once | INTRAMUSCULAR | Status: AC
  Administered 2014-02-26: 2 g via INTRAVENOUS
  Filled 2014-02-26: qty 50

## 2014-02-26 MED ORDER — VECURONIUM BROMIDE 10 MG IV SOLR
10.0000 mg | Freq: Once | INTRAVENOUS | Status: DC
Start: 2014-02-26 — End: 2014-02-26

## 2014-02-26 MED ORDER — HALOPERIDOL LACTATE 2 MG/ML PO CONC
5.0000 mg | Freq: Four times a day (QID) | ORAL | Status: DC
  Administered 2014-02-26 – 2014-02-28 (×9): 5 mg via ORAL
  Filled 2014-02-26 (×12): qty 2.5

## 2014-02-26 MED ORDER — VECURONIUM BROMIDE 10 MG IV SOLR
10.0000 mg | Freq: Once | INTRAVENOUS | Status: AC
  Administered 2014-02-27: 6 mg via INTRAVENOUS
  Filled 2014-02-26 (×2): qty 10

## 2014-02-26 MED ORDER — PROPOFOL 10 MG/ML IV EMUL
5.0000 ug/kg/min | Freq: Once | INTRAVENOUS | Status: AC
  Administered 2014-02-27: 30 ug/kg/min via INTRAVENOUS
  Filled 2014-02-26: qty 100

## 2014-02-26 MED ORDER — FENTANYL CITRATE 0.05 MG/ML IJ SOLN: 200.0000 ug | Freq: Once | INTRAMUSCULAR | Status: DC

## 2014-02-26 NOTE — Progress Notes (Deleted)
[facility] FOR [facility] LAB REPLACEMENT ONLY  The patient does apply for the [facility] Electrolyte [facility] Protocol based on the criteria listed below:   1. Is GFR >/= 40 ml/min? Yes.    Patient's GFR today is >90 2. Is urine output >/= 0.5 ml/kg/hr for the last 6 hours? Yes.   Patient's UOP is 1.9 ml/kg/hr 3. Is BUN < 60 mg/dL? Yes.    Patient's BUN today is 13 4. Abnormal electrolytes K 3.2 5. Ordered repletion with: per protocol 6. If a panic level lab has been reported, has the CCM MD in charge been notified? Yes.  .   Physician:  Dr Cleon DewZ  Tyla Burgner, Lang Snowlizabeth McEachran 02/26/2014 5:29 AM

## 2014-02-26 NOTE — Progress Notes (Signed)
[facility] LAB REPLACEMENT ONLY  The patient does [facility] the [facility] Electrolyte Replacment Protocol based on the criteria listed below:   1. Is GFR >/= 40 ml/min? Yes.    Patient's GFR today is >90 2. Is urine output >/= 0.5 ml/kg/hr for the last 6 hours? Yes.   Patient's UOP is 1.9 ml/kg/hr 3. Is BUN < 60 mg/dL? Yes.    Patient's BUN today is 13 4. Abnormal electrolyte K 3.7 5. Ordered repletion with: per protocol 6. If a panic level lab has been reported, has the CCM MD in charge been notified? Yes.  .   Physician:  Dr Germain OsgoodZ  Khadar Monger McEachran 02/26/2014 6:40 AM

## 2014-02-26 NOTE — Progress Notes (Signed)
PULMONARY / CRITICAL CARE MEDICINE   Name: [name] MRN: [id number] DOB: 11/05/1968    ADMISSION DATE:  02/20/2014  REFERRING MD :  Dr. Deretha Emory  CHIEF COMPLAINT:  Extreme agitation and low blood sugar  INITIAL PRESENTATION:  48 yo female with altered mental status from hypoglycemia.  She has hx of DM and seizures.  Intubated for airway protection.   has a past medical history of Diabetes mellitus without complication.   has past surgical history that includes Tracheostomy and Appendectomy.   STUDIES:  CT head: No acute intracranial abnormality. Mild sinusitis  SIGNIFICANT EVENTS: Intubated 02/20/14 Extubated 02/21/14 Reintubated 8/8 for stridor. Difficult intubation. 6.5 tube. 02/22/14: Sedated on vent 02/23/14: Overnight agitated and attempting to self extubated. Now deeply sedated with fent and diprivan gtt. Fever curve coming down 02/24/14: still very agitated on WUA; propofil changed to precedex 02/25/14: Neuro suspects hypoglycemia related encephalopathy. Want to rule out stroke. Mom indicated full code and SNF placement desire   SUBJECTIVE/OVERNIGHT/INTERVAL HX 02/26/14: climbing CK on diprivan to 1425; approx d5 of diprivan. Agitation continues. Neuro want MRI when feasible.   VITAL SIGNS: Temp:  [95.8 F (35.4 C)-99.3 F (37.4 C)] 99.3 F (37.4 C) (08/13 0800) Pulse Rate:  [57-106] 106 (08/13 0837) Resp:  [16-22] 22 (08/13 0837) BP: (77-132)/(43-85) 101/52 mmHg (08/13 0837) SpO2:  [100 %] 100 % (08/13 0800) FiO2 (%):  [30 %] 30 % (08/13 0837) Weight:  [57.9 kg (127 lb 10.3 oz)] 57.9 kg (127 lb 10.3 oz) (08/13 0500) VENTILATOR SETTINGS: Vent Mode:  [-] PRVC FiO2 (%):  [30 %] 30 % Set Rate:  [16 bmp] 16 bmp Vt Set:  [550 mL] 550 mL PEEP:  [5 cmH20] 5 cmH20 Plateau Pressure:  [28 cmH20-36 cmH20] 36 cmH20 INTAKE / OUTPUT:  Intake/Output Summary (Last 24 hours) at 02/26/14 0932 Last data filed at 02/26/14 0800  Gross per 24 hour  Intake 4848.47 ml  Output    2400 ml  Net 2448.47 ml    PHYSICAL EXAMINATION: General:  Intubated, sedated, ET tube in mouth, multiple missing teeth Neuro: RASS +3 intermittently HEENT:  Atraumatic, no stridor, no enlarged neck LN. Small size et tube Cardiovascular:  RRR, no  murmur Lungs:  Coarse breath sounds bilaterally Abdomen:  Soft ,no guarding, present bowel sounds,TF at 40 cc/hr Musculoskeletal:  No gross deformities, no edema Skin:  Has multiple scabs and shallow ulcers with mild excoriations on the lower extremities, no frank purulent discharge  LABS:  PULMONARY  Recent Labs Lab 02/20/14 2147 02/21/14 0406 02/21/14 1427  PHART 7.478* 7.476* 7.443  PCO2ART 38.5 35.1 33.9*  PO2ART 491.0* 151.0* 110.0*  HCO3 28.5* 25.8* 23.0  TCO2 30 27 24   O2SAT 100.0 99.0 98.0    CBC  Recent Labs Lab 02/24/14 0418 02/25/14 0411 02/26/14 0500  HGB 8.0* 7.5* 7.9*  HCT 25.3* 23.4* 24.3*  WBC 7.1 4.5 4.5  PLT 166 205 184    COAGULATION No results found for this basename: INR,  in the last 168 hours  CARDIAC    Recent Labs Lab 02/25/14 0413  TROPONINI <0.30    Recent Labs Lab 02/21/14 1017 02/24/14 0418  PROBNP 81.3 340.7*     CHEMISTRY  Recent Labs Lab 02/21/14 0314 02/22/14 0230 02/23/14 0215 02/24/14 0418 02/25/14 0411 02/26/14 0500  NA 137 141 139 144 142 145  K 3.1* 3.8 3.1* 4.6 3.2* 3.7  CL 98 106 106 110 111 117*  CO2 24 22 19 21 19  18*  GLUCOSE 197*  208* 240* 215* 173* 95  BUN 15 16 20 16 13 8   CREATININE 0.71 0.76 0.54 0.53 0.57 0.54  CALCIUM 8.2* 7.6* 7.8* 7.2* 7.0* 7.4*  MG 2.7* 2.1  --  1.6 1.8 1.9  PHOS 2.8 1.3*  --  2.7 2.8 3.2   Estimated Creatinine Clearance: 69.5 ml/min (by C-G formula based on Cr of 0.54).   LIVER  Recent Labs Lab 02/20/14 1843 02/21/14 0314  AST 45* 37  ALT 19 19  ALKPHOS 52 54  BILITOT 0.3 0.5  PROT 7.5 7.6  ALBUMIN 3.4* 3.4*     INFECTIOUS  Recent Labs Lab 02/20/14 2026 02/23/14 1035 02/24/14 0418 02/24/14 0423  02/25/14 0411  LATICACIDVEN 1.03  --   --  1.0  --   PROCALCITON  --  0.82 0.70  --  0.29     ENDOCRINE CBG (last 3)   Recent Labs  02/25/14 2349 02/26/14 0417 02/26/14 0815  GLUCAP 176* 101* 186*         IMAGING x48h Dg Chest Port 1 View  02/25/2014   CLINICAL DATA:  Endotracheal tube.  EXAM: PORTABLE CHEST - 1 VIEW  COMPARISON:  02/24/2014  FINDINGS: Endotracheal tube remains in place with tip approximately 2 cm above the carina. Right PICC remains with tip overlying the mid SVC, unchanged. Enteric tube courses into the left upper abdomen with side hole projecting over the gastric body and tip not imaged. Cardiomediastinal silhouette is within normal limits. There is no airspace consolidation, edema, pleural effusion, or pneumothorax.  IMPRESSION: Unchanged support apparatus.  No evidence of acute airspace disease.   Electronically Signed   By: Sebastian Ache   On: 02/25/2014 07:29    Dg Chest Port 1 View  02/25/2014   CLINICAL DATA:  Endotracheal tube.  EXAM: PORTABLE CHEST - 1 VIEW  COMPARISON:  02/24/2014  FINDINGS: Endotracheal tube remains in place with tip approximately 2 cm above the carina. Right PICC remains with tip overlying the mid SVC, unchanged. Enteric tube courses into the left upper abdomen with side hole projecting over the gastric body and tip not imaged. Cardiomediastinal silhouette is within normal limits. There is no airspace consolidation, edema, pleural effusion, or pneumothorax.  IMPRESSION: Unchanged support apparatus.  No evidence of acute airspace disease.   Electronically Signed   By: Sebastian Ache   On: 02/25/2014 07:29      ASSESSMENT / PLAN:  PULMONARY A:  intubated due to extreme combativeness, cannot rule out aspiration pneumonia for now. Extubated 8/8 and promptly reintubated with stridor. Difficult intubation. Cords edematous. S.p 2 doses decadron 02/22/14   - does not meet sbt criteria due to agitation  P:   Vent bundle Consulted Dr  Tyson Alias for trach    CARDIOVASCULAR PICC 02/23/14 ordered >> A:  Qtc 490 msec. Nil acute  P:  Monitor   RENAL A:   No acute issue   P:    monitor I/O, avoid nephrotoxic meds Maintenance IV fluids  GASTROINTESTINAL A:   Nutrition   - TF held 02/24/14 but no residuals 02/25/14 and back on tube feeds P:   TF  SUP with pepcid  HEMATOLOGIC A:   Anemia of critical illness P:  monitor PRBBC for hgb <7gm%   INFECTIOUS A:   Possible aspiration PNA.   - culture negative as of 02/25/14. PCT trend not cw sepsis   P:   BCx2 >> neg UC >>neg Sputum>> neg  Abx:  Unasyn 8/7 >>>8/11 - stop abx, monitor  off abx   ENDOCRINE CBG (last 3)   Recent Labs  02/25/14 2349 02/26/14 0417 02/26/14 0815  GLUCAP 176* 101* 186*     A:   Low blood sugar, known DM on insulin(glucose now 166 8/9)   - Sugars  Again low 8/05/17/14 pm but ok on 02/26/14  P:   SSI Cut down lantus from 15 U to 5U to avoid hypoglycemia Careful if given steroids  NEUROLOGIC A:   Acute encephalopathy 2nd to hypoglycemia at admit 02/20/2014 -     - diprivan only drug that helps but on d5 diprivan CK climbing to 1400 despite fluids.   P:   Dc diprivan Start haldol 5mg  IV q6h; monitor QTc - ekg 02/27/14 Monitor mental status; RASS goal 0 to -2 Nuero consult 02/25/14  Appreciated; needs MRI Fent gtt to continue Restart Versed gtt   GLOBAL 02/25/14: d/w Mom at bedside: full code including LTAC and trach. Wants her placed. This is all likely hypoglycemia onset related agitated delirium. Neuro wants to rule out stroke as well. Dr Tyson AliasFeinstein consulted for trach       The patient is critically ill with multiple organ systems failure and requires high complexity decision making for assessment and support, frequent evaluation and titration of therapies, application of advanced monitoring technologies and extensive interpretation of multiple databases.   Critical Care Time devoted to patient care  services described in this note is  30  Minutes.  Dr. Kalman ShanMurali Montie Swiderski, M.D., [facility].C.P Pulmonary and Critical Care Medicine Staff Physician  System [facility] Pager: [phone], If no answer or between  15:00h - 7:00h: call [phone]  02/26/2014 9:32 AM

## 2014-02-26 NOTE — Procedures (Signed)
EEG report.  Brief clinical history: 48 [facility]. [facility] to severe hypogly[facility] encephalopathy and persistent agitated delirium despite correction of metabolic derangement  Technique: this is a 17 channel routine scalp EEG performed at the bedside in the ICU setting, with bipolar and monopolar montages arranged in accordance to the international 10/20 system of electrode placement. One channel was dedicated to EKG recording.  The patient is intubated on the vent and sedated with propofol which was not turn off to perform this study. No activating procedures performed.  Description: as the study begins and throughout the entire recording, there is evidence of diffuse, continuous, rather monomorphic 6 to 7 10 Hz activity that doesn't follow an ictal pattern at any time No focal or generalized epileptiform discharges noted.  EKG showed sinus rhythm.  Impression: this is an annormal EEG with findings consistent with a non specific mild encephalopathy. No electrographic seizures noted.  Clinical correlation is advised.  ] Wyatt Portelasvaldo Camilo, MD

## 2014-02-27 ENCOUNTER — Inpatient Hospital Stay (HOSPITAL_COMMUNITY): Payer: Medicaid Other

## 2014-02-27 ENCOUNTER — Encounter (HOSPITAL_COMMUNITY): Payer: Self-pay | Admitting: Internal Medicine

## 2014-02-27 DIAGNOSIS — J9620 Acute and chronic respiratory failure, unspecified whether with hypoxia or hypercapnia: Secondary | ICD-10-CM

## 2014-02-27 LAB — BASIC METABOLIC PANEL
Anion gap: 8 (ref 5–15)
BUN: 10 mg/dL (ref 6–23)
CALCIUM: 7.4 mg/dL — AB (ref 8.4–10.5)
CHLORIDE: 110 meq/L (ref 96–112)
CO2: 20 meq/L (ref 19–32)
Creatinine, Ser: 0.61 mg/dL (ref 0.50–1.10)
GFR calc Af Amer: >90 mL/min (ref >90)
GFR calc non Af Amer: >90 mL/min (ref >90)
GLUCOSE: 239 mg/dL — AB (ref 70–99)
Potassium: 4.6 mEq/L (ref 3.7–5.3)
Sodium: 138 mEq/L (ref 137–147)

## 2014-02-27 LAB — GLUCOSE, CAPILLARY
GLUCOSE-CAPILLARY: 149 mg/dL — AB (ref 70–99)
GLUCOSE-CAPILLARY: 142 mg/dL — AB (ref 70–99)
Glucose-Capillary: 238 mg/dL — ABNORMAL HIGH (ref 70–99)
Glucose-Capillary: 216 mg/dL — ABNORMAL HIGH (ref 70–99)
Glucose-Capillary: 240 mg/dL — ABNORMAL HIGH (ref 70–99)
Glucose-Capillary: 163 mg/dL — ABNORMAL HIGH (ref 70–99)

## 2014-02-27 LAB — PHOSPHORUS: Phosphorus: 3.7 mg/dL (ref 2.3–4.6)

## 2014-02-27 LAB — CBC WITH DIFFERENTIAL/PLATELET
BASOS ABS: 0 10*3/uL (ref 0.0–0.1)
Basophils Relative: 0 % (ref 0–1)
Eosinophils Absolute: 0.2 10*3/uL (ref 0.0–0.7)
Eosinophils Relative: 2 % (ref 0–5)
HCT: 24.7 % — ABNORMAL LOW (ref 36.0–46.0)
HEMOGLOBIN: 7.8 g/dL — AB (ref 12.0–15.0)
LYMPHS ABS: 1.4 10*3/uL (ref 0.7–4.0)
Lymphocytes Relative: 18 % (ref 12–46)
MCH: 20.5 pg — ABNORMAL LOW (ref 26.0–34.0)
MCHC: 31.6 g/dL (ref 30.0–36.0)
MCV: 65 fL — AB (ref 78.0–100.0)
MONO ABS: 0.8 10*3/uL (ref 0.1–1.0)
MONOS PCT: 10 % (ref 3–12)
NEUTROS ABS: 5.6 10*3/uL (ref 1.7–7.7)
Neutrophils Relative %: 70 % (ref 43–77)
Platelets: 199 10*3/uL (ref 150–400)
RBC: 3.8 MIL/uL — ABNORMAL LOW (ref 3.87–5.11)
RDW: 16.3 % — AB (ref 11.5–15.5)
WBC: 7.9 10*3/uL (ref 4.0–10.5)

## 2014-02-27 LAB — MAGNESIUM: Magnesium: 1.8 mg/dL (ref 1.5–2.5)

## 2014-02-27 MED ORDER — VITAL AF 1.2 CAL PO LIQD
1000.0000 mL | ORAL | Status: DC
  Administered 2014-02-27: 50 mL
  Administered 2014-02-28 – 2014-03-03 (×5): 1000 mL
  Filled 2014-02-27 (×9): qty 1000

## 2014-02-27 NOTE — Progress Notes (Signed)
Inpatient Diabetes Program Recommendations  AACE/ADA: New Consensus Statement on Inpatient Glycemic Control (2013)  Target Ranges:  Prepandial:   less than 140 mg/dL      Peak postprandial:   less than 180 mg/dL (1-2 hours)      Critically ill patients:  140 - 180 mg/dL   Results for [name], [name] (MRN [id number]) as of 02/27/2014 11:46  Ref. Range 02/26/2014 15:53 02/26/2014 19:41 02/27/2014 00:23 02/27/2014 04:28 02/27/2014 08:24  Glucose-Capillary Latest Range: 70-99 mg/dL 206 (H) 221 (H) 238 (H) 216 (H) 240 (H)   Diabetes history: DM Outpatient Diabetes medications: Lantus 7 units BID Current orders for Inpatient glycemic control: Lantus 5 units QHS, Novolog 2-6 units Q4H (per Phase 1 of ICU Glycemic Control order set)  Inpatient Diabetes Program Recommendations Insulin - IV drip/GlucoStabilizer: Patient is ordered ICU Glycemic Control order set and is currently on Phase 1. According to the protocol, patient met criteria to transition to Phase 2 at 8pm last night and glucose has been over 200 mg/dl over the past 16 hours. Please transition patient to Phase 2 and placed on IV insulin.  NURSING: Per Protocol:  If there is one CBG > 250 mg/dL or two subsequent CBG > 200 mg/dL        A.  Search for ICU Glycemic Control Order Set (Phase 2) in Order Management under Order Sets and place orders per protocol, cosign required.       B.  In Order Management select Active Orders and View by Order Set. Discontinue ALL orders from ICU Glycemic Control Order Set (Phase 1) per protocol, cosign required  Thanks, Barnie Alderman, RN, MSN, [place] Diabetes Coordinator Inpatient Diabetes Program [phone] (Team Pager) [phone] ([place] office) [phone] [facility] office)

## 2014-02-27 NOTE — Procedures (Signed)
Bronchoscopy Procedure Note [name] [id number] 11/05/1968  Procedure: Bronchoscopy Indications: Tracheostomy Placement   Procedure Details Consent: Risks of procedure as well as the alternatives and risks of each were explained to the (patient/caregiver).  Consent for procedure obtained.  Time Out: Verified patient identification, verified procedure, site/side was marked, verified correct patient position, special equipment/implants available, medications/allergies/relevent history reviewed, required imaging and test results available.  Performed  In preparation for procedure, patient was given 100% FiO2 and bronchoscope lubricated. Sedation: Propofol, Fentanyl, Vecuronium   Airway entered and the following bronchi were examined: RUL and LUL.   Procedures performed: #6 Tracheostomy Placed Bronchoscope removed.    Evaluation Hemodynamic Status: BP stable throughout; O2 sats: stable throughout Patient's Current Condition: stable Specimens: None Complications: No apparent complications Patient did tolerate procedure well.  Procedure performed under direct supervision of Dr. Marchelle Gearingamaswamy.     Canary BrimBrandi Ahmya Bernick, NP-C [place] [facility] Pgr: [phone] or [phone]    02/27/2014

## 2014-02-27 NOTE — Progress Notes (Signed)
PULMONARY / CRITICAL CARE MEDICINE   Name: [name] MRN: [id number] DOB: 1965/07/18    ADMISSION DATE:  02/20/2014  REFERRING MD :  Dr. Deretha Emory  CHIEF COMPLAINT:  Extreme agitation and low blood sugar  INITIAL PRESENTATION:  48 yo female with altered mental status from hypoglycemia.  She has hx of DM and seizures.  Intubated for airway protection.   has a past medical history of Diabetes mellitus without complication.   has past surgical history that includes Tracheostomy; Appendectomy; and Tracheostomy.   STUDIES:  CT head: No acute intracranial abnormality. Mild sinusitis  SIGNIFICANT EVENTS: Intubated 02/20/14 Extubated 02/21/14 Reintubated 8/8 for stridor. Difficult intubation. 6.5 tube. 02/22/14: Sedated on vent 02/23/14: Overnight agitated and attempting to self extubated. Now deeply sedated with fent and diprivan gtt. Fever curve coming down 02/24/14: still very agitated on WUA; propofil changed to precedex 02/25/14: Neuro suspects hypoglycemia related encephalopathy. Want to rule out stroke. Mom indicated full code and SNF placement desire 02/26/14: climbing CK on diprivan to 1425; approx d5 of diprivan. Agitation continues. Neuro want MRI when feasible.    SUBJECTIVE/OVERNIGHT/INTERVAL HX 02/27/14: s/p trach today. stil agitated. Needing high dose fentanyl and versed  VITAL SIGNS: Temp:  [98.6 F (37 C)-100.8 F (38.2 C)] 100.4 F (38 C) (08/14 1100) Pulse Rate:  [80-120] 83 (08/14 1243) Resp:  [0-17] 16 (08/14 1243) BP: (80-140)/(46-72) 127/59 mmHg (08/14 1243) SpO2:  [100 %] 100 % (08/14 1243) FiO2 (%):  [30 %] 30 % (08/14 1243) Weight:  [61.4 kg (135 lb 5.8 oz)] 61.4 kg (135 lb 5.8 oz) (08/14 0600) VENTILATOR SETTINGS: Vent Mode:  [-] PRVC FiO2 (%):  [30 %] 30 % Set Rate:  [16 bmp] 16 bmp Vt Set:  [550 mL] 550 mL PEEP:  [5 cmH20] 5 cmH20 Plateau Pressure:  [23 cmH20-40 cmH20] 23 cmH20 INTAKE / OUTPUT:  Intake/Output Summary (Last 24 hours) at 02/27/14  1443 Last data filed at 02/27/14 0600  Gross per 24 hour  Intake 3661.37 ml  Output   1200 ml  Net 2461.37 ml    PHYSICAL EXAMINATION: General:  Intubated, sedated, ET tube in mouth, multiple missing teeth Neuro: RASS +3 intermittently HEENT:  Atraumatic, no stridor, no enlarged neck LN. Small size et tube Cardiovascular:  RRR, no  murmur Lungs:  Coarse breath sounds bilaterally Abdomen:  Soft ,no guarding, present bowel sounds,TF at 40 cc/hr Musculoskeletal:  No gross deformities, no edema Skin:  Has multiple scabs and shallow ulcers with mild excoriations on the lower extremities, no frank purulent discharge  LABS:  PULMONARY  Recent Labs Lab 02/20/14 2147 02/21/14 0406 02/21/14 1427  PHART 7.478* 7.476* 7.443  PCO2ART 38.5 35.1 33.9*  PO2ART 491.0* 151.0* 110.0*  HCO3 28.5* 25.8* 23.0  TCO2 30 27 24   O2SAT 100.0 99.0 98.0    CBC  Recent Labs Lab 02/25/14 0411 02/26/14 0500 02/27/14 0405  HGB 7.5* 7.9* 7.8*  HCT 23.4* 24.3* 24.7*  WBC 4.5 4.5 7.9  PLT 205 184 199    COAGULATION  Recent Labs Lab 02/26/14 1800  INR 1.10    CARDIAC    Recent Labs Lab 02/25/14 0413  TROPONINI <0.30    Recent Labs Lab 02/21/14 1017 02/24/14 0418  PROBNP 81.3 340.7*     CHEMISTRY  Recent Labs Lab 02/22/14 0230 02/23/14 0215 02/24/14 0418 02/25/14 0411 02/26/14 0500 02/27/14 0405  NA 141 139 144 142 145 138  K 3.8 3.1* 4.6 3.2* 3.7 4.6  CL 106 106 110 111 117* 110  CO2 22 19 21 19  18* 20  GLUCOSE 208* 240* 215* 173* 95 239*  BUN 16 20 16 13 8 10   CREATININE 0.76 0.54 0.53 0.57 0.54 0.61  CALCIUM 7.6* 7.8* 7.2* 7.0* 7.4* 7.4*  MG 2.1  --  1.6 1.8 1.9 1.8  PHOS 1.3*  --  2.7 2.8 3.2 3.7   Estimated Creatinine Clearance: 73.3 ml/min (by C-G formula based on Cr of 0.61).   LIVER  Recent Labs Lab 02/20/14 1843 02/21/14 0314 02/26/14 1800  AST 45* 37  --   ALT 19 19  --   ALKPHOS 52 54  --   BILITOT 0.3 0.5  --   PROT 7.5 7.6  --    ALBUMIN 3.4* 3.4*  --   INR  --   --  1.10     INFECTIOUS  Recent Labs Lab 02/20/14 2026 02/23/14 1035 02/24/14 0418 02/24/14 0423 02/25/14 0411  LATICACIDVEN 1.03  --   --  1.0  --   PROCALCITON  --  0.82 0.70  --  0.29     ENDOCRINE CBG (last 3)   Recent Labs  02/27/14 0428 02/27/14 0824 02/27/14 1231  GLUCAP 216* 240* 163*         IMAGING x48h No results found.  Chest Portable 1 View To Assess Tube Placement And Rule-out Pneumothorax  02/27/2014   CLINICAL DATA:  Tracheostomy tube placement. Acute respiratory failure with hypoxia.  EXAM: PORTABLE CHEST - 1 VIEW  COMPARISON:  02/25/2014  FINDINGS: A new tracheostomy tube is seen in appropriate position. No evidence of pneumomediastinum or pneumothorax.  Increased diffuse interstitial prominence is seen, suspicious for mild edema. No evidence of focal consolidation or pleural effusion. Heart size is stable. Right arm PICC line remains in appropriate position.  IMPRESSION: New tracheostomy tube in appropriate position. No evidence of pneumothorax.  Increased diffuse interstitial prominence suspicious for mild pulmonary edema.   Electronically Signed   By: Myles RosenthalJohn  Stahl M.D.   On: 02/27/2014 12:54      ASSESSMENT / PLAN:  PULMONARY A:  Acute on chronic resp failure on account on severe agitation and including cord edema/stridor at reintubation 02/21/14. S/p trach 02/27/14   - P:   Vent bundle Post trach care    CARDIOVASCULAR PICC 02/23/14 ordered >> A:  Qtc 447 msec. Nil acute  P:  Monitor Repeat ekg 02/28/14   RENAL A:   No acute issue   P:    monitor I/O, avoid nephrotoxic meds Maintenance IV fluids  GASTROINTESTINAL A:   Nutrition   - TF held 02/24/14 but no residuals 02/25/14 and back on tube feeds P:   TF restart after trach SUP with pepcid  HEMATOLOGIC A:   Anemia of critical illness P:  monitor PRBBC for hgb <7gm%   INFECTIOUS A:   Possible aspiration PNA.   - culture  negative as of 02/25/14. PCT trend not cw sepsis   P:   BCx2 >> neg UC >>neg Sputum>> neg  Abx:  Unasyn 8/7 >>>8/11 - stop abx, monitor off abx   ENDOCRINE CBG (last 3)   Recent Labs  02/27/14 0428 02/27/14 0824 02/27/14 1231  GLUCAP 216* 240* 163*     A:   Low blood sugar, known DM on insulin(glucose now 166 8/9)   - Sugars  Again low 8/05/17/14 pm but ok on 02/26/14  P:   SSI Cut down lantus from 15 U to 5U to avoid hypoglycemia Careful if given steroids  NEUROLOGIC A:  Acute encephalopathy 2nd to hypoglycemia at admit 02/20/2014 -     - diprivan only drug that helps but on d5 diprivan CK climbing to 1400 despite fluids.  MRI brain pending 03/01/15  P:   Recheck CK 02/28/14 Start haldol 5mg  IV q6h; monitor QTc - ekg 02/27/14 Monitor mental status; RASS goal 0 to -2 Nuero consult 02/25/14  Appreciated; needs MRI Fent gtt to continue Versed gtt   GLOBAL 02/25/14: d/w Mom at bedside: full code including LTAC and trach. Wants her placed. This is all likely hypoglycemia onset related agitated delirium. Neuro wants to rule out stroke as well.   02/26/14 Mom consented for trach by Dr Tyson Alias  02/27/14: no family at bedside. Needs placement       The patient is critically ill with multiple organ systems failure and requires high complexity decision making for assessment and support, frequent evaluation and titration of therapies, application of advanced monitoring technologies and extensive interpretation of multiple databases.   Critical Care Time devoted to patient care services described in this note is  30  Minutes.  Dr. Kalman Shan, M.D., [facility].C.P Pulmonary and Critical Care Medicine Staff Physician [facility] [place] [facility] Pager: [phone], If no answer or between  15:00h - 7:00h: call [phone]  02/27/2014 2:43 PM

## 2014-02-27 NOTE — Procedures (Signed)
Staff note  Supervised procedure of flexible bronch   Procedure done by NP [name]. At first bronch was introduce through ET tube and structures of tracheal rings, carina identified for operator of tracheostomy who was Dr Tyson AliasFeinstein. Light of bronch passed through trachea and skin for indentification of tracheal rings for tracheostomy puncture. After this, under bronchoscopy guidance,  ET tube was pulled back sufficiently and very carefully. The ET tube was  pulled back enough to give room for tracheostomy operator and yet at same time to to ensure a secured airway. After this was accomplished, bronchoscope was withdrawn into the ET tube. After this,  Dr Tyson AliasFeinstein then performed tracheostomy under video visual provided by flexible video bronchoscopy. Followng introduction of tracheostomy,  the bronchoscope was removed from ET tube and introduced through tracheostomy. Correct position of tracheostomy was ensured, with enough room between carina and distal tracheostomy and no evidence of bleeding. The bronchoscope was then withdrawn. Respiratory therapist was then instructed to remove the ET tube.  Dr Tyson AliasFeinstein then proceeded to complete the tracheostomy with stay sutures   No complications     Dr. Kalman ShanMurali Sameria Morss, M.D., [facility].C.P Pulmonary and Critical Care Medicine Staff Physician [facility] [place] [facility] Pager: [phone], If no answer or between  15:00h - 7:00h: call [phone]  02/27/2014 2:40 PM

## 2014-02-27 NOTE — Progress Notes (Signed)
NUTRITION FOLLOW UP  Intervention:    Continue 43M PEPuP Protocol, changeTF via OGT to Vital AF 1.2 at 50 ml/h (1200 ml per day) to provide 1440 kcals, 90 gm protein, 973 ml free water daily.  Nutrition Dx:   Inadequate oral intake related to inability to eat as evidenced by NPO status. Ongoing.  Goal:   Intake to meet >90% of estimated nutrition needs. Unmet.  Monitor:   TF tolerance/adequacy, weight trend, labs, vent status.  Assessment:   48 yo female with altered mental status from hypoglycemia. She has hx of DM and seizures. Intubated on admission for airway protection.  Patient is currently intubated on ventilator support MV: 8.9 L/min Temp (24hrs), Avg:99.7 F (37.6 C), Min:98.6 F (37 C), Max:100.8 F (38.2 C)  Propofol: off  Currently receiving Vital High Protein at 40 ml/h (960 ml/day) via OGT to provide 960 kcals, 84 gm protein, 803 ml free water daily. Propofol has been providing the remainder of patient's calorie needs; Propofol stopped on 8/13.   TF held 8/11, resumed 8/12. Patient remains a full code with plans for trachestomy soon. Neurology to rule out stroke.  Height: Ht Readings from Last 1 Encounters:  02/23/14 5\' 2"  (1.575 m)    Weight Status:  Up with positive fluid status Wt Readings from Last 1 Encounters:  02/27/14 135 lb 5.8 oz (61.4 kg)  02/23/14  111 lb 8.8 oz (50.6 kg)  Re-estimated needs:  Kcal: 1502 Protein: 70-80 gm Fluid: 1.5 L  Skin: WDL  Diet Order: NPO   Intake/Output Summary (Last 24 hours) at 02/27/14 1056 Last data filed at 02/27/14 0600  Gross per 24 hour  Intake 3661.37 ml  Output   1550 ml  Net 2111.37 ml    Last BM: 8/7   Labs:   Recent Labs Lab 02/25/14 0411 02/26/14 0500 02/27/14 0405  NA 142 145 138  K 3.2* 3.7 4.6  CL 111 117* 110  CO2 19 18* 20  BUN 13 8 10   CREATININE 0.57 0.54 0.61  CALCIUM 7.0* 7.4* 7.4*  MG 1.8 1.9 1.8  PHOS 2.8 3.2 3.7  GLUCOSE 173* 95 239*    CBG (last 3)   Recent  Labs  02/27/14 0023 02/27/14 0428 02/27/14 0824  GLUCAP 238* 216* 240*    Scheduled Meds: . antiseptic oral rinse  7 mL Mouth Rinse QID  . chlorhexidine  15 mL Mouth Rinse BID  . etomidate  40 mg Intravenous Once  . famotidine  20 mg Oral BID  . feeding supplement (VITAL HIGH PROTEIN)  1,000 mL Per Tube Q24H  . fentaNYL  200 mcg Intravenous Once  . haloperidol  5 mg Oral Q6H  . heparin  5,000 Units Subcutaneous 3 times per day  . insulin aspart  2-6 Units Subcutaneous 6 times per day  . insulin glargine  5 Units Subcutaneous QHS  . midazolam  4 mg Intravenous Once  . propofol  0.5 mg/kg Intravenous Once  . propofol  5-70 mcg/kg/min Intravenous Once  . senna-docusate  2 tablet Per Tube BID  . sodium chloride  1,000 mL Intravenous Once  . sodium chloride  10-40 mL Intracatheter Q12H  . vecuronium  10 mg Intravenous Once  . vecuronium  0.08 mg/kg Intravenous Once    Continuous Infusions: . dextrose 5 % and 0.45% NaCl 75 mL/hr at 02/27/14 0017  . fentaNYL infusion INTRAVENOUS 400 mcg/hr (02/27/14 0432)  . midazolam (VERSED) infusion 10 mg/hr (02/27/14 0948)    Joaquin Courts, RD,  LDN, CNSC Pager [phone] After Hours Pager [phone]

## 2014-02-27 NOTE — Care Management Note (Addendum)
    Page 1 of 2   03/04/2014     10:30:35 AM CARE MANAGEMENT NOTE 03/04/2014  Patient:  [name],[name]   Account Number:  [id number]  Date Initiated:  02/21/2014  Documentation initiated by:  Avie ArenasBROWN,SARAH  Subjective/Objective Assessment:   AMS - intubated     Action/Plan:   Anticipated DC Date:  03/06/2014   Anticipated DC Plan:  LONG TERM ACUTE CARE (LTAC)  In-house referral  Clinical Social Worker      DC Associate Professorlanning Services  CM consult      [facility]  [facility]   Choice offered to / List presented to:             Status of service:  Completed, signed off Medicare Important Message given?   (If response is "NO", the following Medicare IM given date fields will be blank) Date Medicare IM given:   Medicare IM given by:   Date Additional Medicare IM given:   Additional Medicare IM given by:    Discharge Disposition:    Per UR Regulation:  Reviewed for med. necessity/level of care/duration of stay  If discussed at Long Length of Stay Meetings, dates discussed:   02/26/2014  03/03/2014  03/05/2014    Comments:  Contact:  [name],[name]   [phone]                [name],[name] Other [phone]  8-19 1028a debbie Casper Pagliuca rn,bsn pt has been approved for [facility] under indigent program. unable to reach [employer] maysha or mother mary. have left vm for fam to call. bed available today.  03/03/14 Sidney AceJulie Amerson, RN, BSN [phone] [facility]  Likely have decision in AM, per Boneta LucksJenny, [facility] admissions liasion.  Will follow up.  8/14 127p debbie Lavel Rieman rn,bsn pt has no ins listed. if self pay will not be elidg for ltac. have made sw ref for trac/vent snf or trach snf depending on pt's progress.

## 2014-02-27 NOTE — Procedures (Signed)
Perc trach re do  See full dictation Shiley 6 placed Blood loss less 1 cc  Mcarthur Rossettianiel J. Tyson AliasFeinstein, MD, FACP Pgr: [phone] [place] [facility]

## 2014-02-27 NOTE — Procedures (Signed)
Bedside Tracheostomy Insertion Procedure Note   Patient Details:   Name: [name] Pinnix DOB: 11/05/1968 MRN: [id number]  Procedure: Tracheostomy  Pre Procedure Assessment: ET Tube Size:6.5 ET Tube secured at lip (cm): 21 Bite block in place: No Breath Sounds: Clear  Post Procedure Assessment: BP 104/56  Pulse 87  Temp(Src) 100.4 F (38 C) (Core (Comment))  Resp 16  Ht 5\' 2"  (1.575 m)  Wt 135 lb 5.8 oz (61.4 kg)  BMI 24.75 kg/m2  SpO2 100%  LMP 02/05/2014 O2 sats: stable throughout Complications: No apparent complications Patient did tolerate procedure well Tracheostomy Brand:Shiley Tracheostomy Style:Cuffed Tracheostomy Size: 6 Tracheostomy Secured WUJ:[id number]:Sutures Tracheostomy Placement Confirmation:Trach cuff visualized and in place and Chest X ray ordered for placement    Jacqulynn CadetHopper, Waylin Dorko David 02/27/2014, 12:26 PM

## 2014-02-28 ENCOUNTER — Inpatient Hospital Stay (HOSPITAL_COMMUNITY): Payer: Medicaid Other

## 2014-02-28 LAB — BASIC METABOLIC PANEL
Anion gap: 10 (ref 5–15)
BUN: 7 mg/dL (ref 6–23)
CO2: 22 mEq/L (ref 19–32)
CREATININE: 0.68 mg/dL (ref 0.50–1.10)
Calcium: 8.1 mg/dL — ABNORMAL LOW (ref 8.4–10.5)
Chloride: 106 mEq/L (ref 96–112)
GFR calc non Af Amer: >90 mL/min (ref >90)
GLUCOSE: 195 mg/dL — AB (ref 70–99)
Potassium: 4.2 mEq/L (ref 3.7–5.3)
Sodium: 138 mEq/L (ref 137–147)

## 2014-02-28 LAB — GLUCOSE, CAPILLARY
GLUCOSE-CAPILLARY: 135 mg/dL — AB (ref 70–99)
GLUCOSE-CAPILLARY: 76 mg/dL (ref 70–99)
Glucose-Capillary: 189 mg/dL — ABNORMAL HIGH (ref 70–99)
Glucose-Capillary: 206 mg/dL — ABNORMAL HIGH (ref 70–99)
Glucose-Capillary: 226 mg/dL — ABNORMAL HIGH (ref 70–99)
Glucose-Capillary: 267 mg/dL — ABNORMAL HIGH (ref 70–99)

## 2014-02-28 LAB — CBC
HEMATOCRIT: 23.3 % — AB (ref 36.0–46.0)
HEMOGLOBIN: 7.6 g/dL — AB (ref 12.0–15.0)
MCH: 20.2 pg — AB (ref 26.0–34.0)
MCHC: 32.6 g/dL (ref 30.0–36.0)
MCV: 62 fL — AB (ref 78.0–100.0)
Platelets: 198 10*3/uL (ref 150–400)
RBC: 3.76 MIL/uL — ABNORMAL LOW (ref 3.87–5.11)
RDW: 15.8 % — ABNORMAL HIGH (ref 11.5–15.5)
WBC: 9.4 10*3/uL (ref 4.0–10.5)

## 2014-02-28 LAB — CK TOTAL AND CKMB (NOT AT ARMC)
CK, MB: 1.6 ng/mL (ref 0.3–4.0)
Relative Index: 0.3 (ref 0.0–2.5)
Total CK: 506 U/L — ABNORMAL HIGH (ref 7–177)

## 2014-02-28 LAB — MAGNESIUM: MAGNESIUM: 1.6 mg/dL (ref 1.5–2.5)

## 2014-02-28 LAB — PHOSPHORUS: Phosphorus: 3.5 mg/dL (ref 2.3–4.6)

## 2014-02-28 MED ORDER — FAMOTIDINE 40 MG/5ML PO SUSR
20.0000 mg | Freq: Two times a day (BID) | ORAL | Status: DC
  Administered 2014-02-28 – 2014-03-04 (×8): 20 mg
  Filled 2014-02-28 (×9): qty 2.5

## 2014-02-28 MED ORDER — INSULIN ASPART 100 UNIT/ML ~~LOC~~ SOLN
0.0000 [IU] | SUBCUTANEOUS | Status: DC
  Administered 2014-02-28: 2 [IU] via SUBCUTANEOUS
  Administered 2014-03-01: 8 [IU] via SUBCUTANEOUS
  Administered 2014-03-01: 3 [IU] via SUBCUTANEOUS
  Administered 2014-03-01: 2 [IU] via SUBCUTANEOUS
  Administered 2014-03-01: 8 [IU] via SUBCUTANEOUS
  Administered 2014-03-01: 2 [IU] via SUBCUTANEOUS
  Administered 2014-03-01 – 2014-03-02 (×5): 3 [IU] via SUBCUTANEOUS
  Administered 2014-03-02: 2 [IU] via SUBCUTANEOUS
  Administered 2014-03-03: 3 [IU] via SUBCUTANEOUS
  Administered 2014-03-03: 2 [IU] via SUBCUTANEOUS
  Administered 2014-03-03 (×3): 5 [IU] via SUBCUTANEOUS
  Administered 2014-03-03: 2 [IU] via SUBCUTANEOUS
  Administered 2014-03-04: 5 [IU] via SUBCUTANEOUS
  Administered 2014-03-04: 2 [IU] via SUBCUTANEOUS
  Administered 2014-03-04: 8 [IU] via SUBCUTANEOUS

## 2014-02-28 MED ORDER — DEXMEDETOMIDINE HCL IN NACL 200 MCG/50ML IV SOLN
0.4000 ug/kg/h | INTRAVENOUS | Status: DC
  Administered 2014-02-28 (×3): 1 ug/kg/h via INTRAVENOUS
  Filled 2014-02-28 (×3): qty 50

## 2014-02-28 MED ORDER — HALOPERIDOL LACTATE 2 MG/ML PO CONC
5.0000 mg | Freq: Four times a day (QID) | ORAL | Status: DC
  Administered 2014-02-28 – 2014-03-01 (×4): 5 mg
  Filled 2014-02-28 (×7): qty 2.5

## 2014-02-28 NOTE — Progress Notes (Signed)
NEURO HOSPITALIST PROGRESS NOTE   SUBJECTIVE:                                                                                                                        Neurologically unchanged, remains intubated on the vent requiring fentanyl, versed, and haldol. She is intermittently febrile. MRI brain pending. EEG with findings consistent with a non specific mild encephalopathy. No electrographic seizures noted.   OBJECTIVE:                                                                                                                           Vital signs in last 24 hours: Temp:  [99.3 F (37.4 C)-101.4 F (38.6 C)] 99.5 F (37.5 C) (08/15 0700) Pulse Rate:  [82-112] 91 (08/15 0806) Resp:  [13-17] 16 (08/15 0806) BP: (91-153)/(48-91) 91/55 mmHg (08/15 0806) SpO2:  [100 %] 100 % (08/15 0806) FiO2 (%):  [30 %] 30 % (08/15 0806) Weight:  [58.4 kg (128 lb 12 oz)] 58.4 kg (128 lb 12 oz) (08/15 0500)  Intake/Output from previous day: 08/14 0701 - 08/15 0700 In: 3895 [I.V.:3095; NG/GT:800] Out: 3851 [Urine:3850; Stool:1] Intake/Output this shift:   Nutritional status: NPO  Past Medical History  Diagnosis Date  . Diabetes mellitus without complication     Neurologic Exam:  Mental status: open eyes spontaneously but does not follow commands.  CN 2-12: resists eye exam, asymmetric pupils (prior surgery?), reactive. ? Seem to have a right gaze preference. Face symmetric. Tongue: intubated.  Motor: moves right side and right leg spontaneously but less motor activity left arm  Sensory: reacts to pain.  DTR's: overactive mainly in he left side  Plantars: downgoing,.  Coordination and gait: unable to test   Lab Results: No results found for this basename: cbc, bmp, coags, chol, tri, ldl, hga1c   Lipid Panel No results found for this basename: CHOL, TRIG, HDL, CHOLHDL, VLDL, LDLCALC,  in the last 72 hours  Studies/Results: Dg Chest Port 1  View  02/28/2014   CLINICAL DATA:  Evaluate tracheostomy  EXAM: PORTABLE CHEST - 1 VIEW  COMPARISON:  02/27/2014  FINDINGS: Tracheostomy is stable in well positioned. An enteric tube is new passing below the diaphragm and below the included field  of view. Right PICC is stable with its tip in the region of the caval atrial junction.  Cardiac silhouette is normal in size. Normal mediastinal and hilar contours. Clear lungs.  IMPRESSION: 1. No acute cardiopulmonary disease. Interstitial prominence noted on the previous day's exam is no longer evident. 2. Tracheostomy tube is well positioned.   Electronically Signed   By: Amie Portland M.D.   On: 02/28/2014 07:55   Chest Portable 1 View To Assess Tube Placement And Rule-out Pneumothorax  02/27/2014   CLINICAL DATA:  Tracheostomy tube placement. Acute respiratory failure with hypoxia.  EXAM: PORTABLE CHEST - 1 VIEW  COMPARISON:  02/25/2014  FINDINGS: A new tracheostomy tube is seen in appropriate position. No evidence of pneumomediastinum or pneumothorax.  Increased diffuse interstitial prominence is seen, suspicious for mild edema. No evidence of focal consolidation or pleural effusion. Heart size is stable. Right arm PICC line remains in appropriate position.  IMPRESSION: New tracheostomy tube in appropriate position. No evidence of pneumothorax.  Increased diffuse interstitial prominence suspicious for mild pulmonary edema.   Electronically Signed   By: Myles Rosenthal M.D.   On: 02/27/2014 12:54   Dg Abd Portable 1v  02/27/2014   CLINICAL DATA:  Panda tube placement  EXAM: PORTABLE ABDOMEN - 1 VIEW  COMPARISON:  02/21/2014  FINDINGS: Enteric tubes passes below the diaphragm into the region of the distal stomach.  Normal bowel gas pattern.  Soft tissues are unremarkable.  IMPRESSION: Metal tip of the enteric tube lies in the distal stomach.   Electronically Signed   By: Amie Portland M.D.   On: 02/27/2014 15:42    MEDICATIONS                                                                                                                         Scheduled: . antiseptic oral rinse  7 mL Mouth Rinse QID  . chlorhexidine  15 mL Mouth Rinse BID  . etomidate  40 mg Intravenous Once  . famotidine  20 mg Oral BID  . fentaNYL  200 mcg Intravenous Once  . haloperidol  5 mg Oral Q6H  . heparin  5,000 Units Subcutaneous 3 times per day  . insulin aspart  2-6 Units Subcutaneous 6 times per day  . insulin glargine  5 Units Subcutaneous QHS  . midazolam  4 mg Intravenous Once  . senna-docusate  2 tablet Per Tube BID  . sodium chloride  1,000 mL Intravenous Once  . sodium chloride  10-40 mL Intracatheter Q12H    ASSESSMENT/PLAN:  [facility]cemic encephalopathy and persistent agitated delirium despite correction of metabolic derangements. On haldol, intermittently febrile, at times rigid during exam but CK trending down, and I am not convinced she can have NMS. Consider discontinuing haldol. Follow up CK. Patient still very restless for MRI. Will get portable CT brain today. Will follow up.   Wyatt Portelasvaldo Camilo, MD [facility] [phone]  02/28/2014, 12:04 PM

## 2014-02-28 NOTE — Progress Notes (Signed)
PULMONARY / CRITICAL CARE MEDICINE   Name: [name] MRN: [id number] DOB: 11/05/1968    ADMISSION DATE:  02/20/2014  REFERRING MD :  Dr. Deretha Emory  CHIEF COMPLAINT:  Extreme agitation and low blood sugar  INITIAL PRESENTATION:  48 yo female with altered mental status from hypoglycemia.  She has hx of DM and seizures.  Intubated for airway protection.   has a past medical history of Diabetes mellitus without complication.   has past surgical history that includes Tracheostomy; Appendectomy; and Tracheostomy.   STUDIES:  CT head: No acute intracranial abnormality. Mild sinusitis  SIGNIFICANT EVENTS: Intubated 02/20/14 Extubated 02/21/14 Reintubated 8/8 for stridor. Difficult intubation. 6.5 tube. 02/22/14: Sedated on vent 02/23/14: Overnight agitated and attempting to self extubated. Now deeply sedated with fent and diprivan gtt. Fever curve coming down 02/24/14: still very agitated on WUA; propofil changed to precedex 02/25/14: Neuro suspects hypoglycemia related encephalopathy. Want to rule out stroke. Mom indicated full code and SNF placement desire 02/26/14: climbing CK on diprivan to 1425; approx d5 of diprivan. Agitation continues. Neuro want MRI when feasible.  8/14 Trach   SUBJECTIVE/OVERNIGHT/INTERVAL HX 02/28/14: trach yesterday , for bedside CT head   Remains  agitated and does not follow commands   VITAL SIGNS: Temp:  [99.3 F (37.4 C)-101.4 F (38.6 C)] 99.5 F (37.5 C) (08/15 0700) Pulse Rate:  [82-116] 116 (08/15 1212) Resp:  [13-17] 16 (08/15 1212) BP: (91-153)/(48-91) 140/69 mmHg (08/15 1212) SpO2:  [100 %] 100 % (08/15 1212) FiO2 (%):  [30 %] 30 % (08/15 1212) Weight:  [58.4 kg (128 lb 12 oz)] 58.4 kg (128 lb 12 oz) (08/15 0500) VENTILATOR SETTINGS: Vent Mode:  [-] PRVC FiO2 (%):  [30 %] 30 % Set Rate:  [16 bmp] 16 bmp Vt Set:  [550 mL] 550 mL PEEP:  [5 cmH20] 5 cmH20 Plateau Pressure:  [23 cmH20-36 cmH20] 33 cmH20 INTAKE / OUTPUT:  Intake/Output  Summary (Last 24 hours) at 02/28/14 1239 Last data filed at 02/28/14 0600  Gross per 24 hour  Intake   3230 ml  Output   3301 ml  Net    -71 ml    PHYSICAL EXAMINATION: General:  Chronically ill appearing on vent  Neuro: agitated intermittently  HEENT:  Atraumatic,  Neck: Trach midline , dsg clean /dry Cardiovascular:  RRR, no  murmur Lungs:  Coarse breath sounds bilaterally Abdomen:  Soft ,no guarding, present bowel sounds,TF at 40 cc/hr Musculoskeletal:  No gross deformities, no edema Skin:  Has multiple scabs and shallow ulcers with mild excoriations on the lower extremities, no frank purulent discharge  LABS:  PULMONARY  Recent Labs Lab 02/21/14 1427  PHART 7.443  PCO2ART 33.9*  PO2ART 110.0*  HCO3 23.0  TCO2 24  O2SAT 98.0    CBC  Recent Labs Lab 02/26/14 0500 02/27/14 0405 02/28/14 0351  HGB 7.9* 7.8* 7.6*  HCT 24.3* 24.7* 23.3*  WBC 4.5 7.9 9.4  PLT 184 199 198    COAGULATION  Recent Labs Lab 02/26/14 1800  INR 1.10    CARDIAC    Recent Labs Lab 02/25/14 0413  TROPONINI <0.30    Recent Labs Lab 02/24/14 0418  PROBNP 340.7*     CHEMISTRY  Recent Labs Lab 02/24/14 0418 02/25/14 0411 02/26/14 0500 02/27/14 0405 02/28/14 0350 02/28/14 0351  NA 144 142 145 138  --  138  K 4.6 3.2* 3.7 4.6  --  4.2  CL 110 111 117* 110  --  106  CO2 21 19  18* 20  --  22  GLUCOSE 215* 173* 95 239*  --  195*  BUN 16 13 8 10   --  7  CREATININE 0.53 0.57 0.54 0.61  --  0.68  CALCIUM 7.2* 7.0* 7.4* 7.4*  --  8.1*  MG 1.6 1.8 1.9 1.8 1.6  --   PHOS 2.7 2.8 3.2 3.7  --  3.5   Estimated Creatinine Clearance: 69.5 ml/min (by C-G formula based on Cr of 0.68).   LIVER  Recent Labs Lab 02/26/14 1800  INR 1.10     INFECTIOUS  Recent Labs Lab 02/23/14 1035 02/24/14 0418 02/24/14 0423 02/25/14 0411  LATICACIDVEN  --   --  1.0  --   PROCALCITON 0.82 0.70  --  0.29     ENDOCRINE CBG (last 3)   Recent Labs  02/28/14 0409  02/28/14 0718 02/28/14 1130  GLUCAP 189* 206* 226*         IMAGING x48h Chest Portable 1 View To Assess Tube Placement And Rule-out Pneumothorax  02/27/2014   CLINICAL DATA:  Tracheostomy tube placement. Acute respiratory failure with hypoxia.  EXAM: PORTABLE CHEST - 1 VIEW  COMPARISON:  02/25/2014  FINDINGS: A new tracheostomy tube is seen in appropriate position. No evidence of pneumomediastinum or pneumothorax.  Increased diffuse interstitial prominence is seen, suspicious for mild edema. No evidence of focal consolidation or pleural effusion. Heart size is stable. Right arm PICC line remains in appropriate position.  IMPRESSION: New tracheostomy tube in appropriate position. No evidence of pneumothorax.  Increased diffuse interstitial prominence suspicious for mild pulmonary edema.   Electronically Signed   By: Myles RosenthalJohn  Stahl M.D.   On: 02/27/2014 12:54   Dg Abd Portable 1v  02/27/2014   CLINICAL DATA:  Panda tube placement  EXAM: PORTABLE ABDOMEN - 1 VIEW  COMPARISON:  02/21/2014  FINDINGS: Enteric tubes passes below the diaphragm into the region of the distal stomach.  Normal bowel gas pattern.  Soft tissues are unremarkable.  IMPRESSION: Metal tip of the enteric tube lies in the distal stomach.   Electronically Signed   By: Amie Portlandavid  Ormond M.D.   On: 02/27/2014 15:42    Dg Chest Port 1 View  02/28/2014   CLINICAL DATA:  Evaluate tracheostomy  EXAM: PORTABLE CHEST - 1 VIEW  COMPARISON:  02/27/2014  FINDINGS: Tracheostomy is stable in well positioned. An enteric tube is new passing below the diaphragm and below the included field of view. Right PICC is stable with its tip in the region of the caval atrial junction.  Cardiac silhouette is normal in size. Normal mediastinal and hilar contours. Clear lungs.  IMPRESSION: 1. No acute cardiopulmonary disease. Interstitial prominence noted on the previous day's exam is no longer evident. 2. Tracheostomy tube is well positioned.   Electronically Signed    By: Amie Portlandavid  Ormond M.D.   On: 02/28/2014 07:55   Chest Portable 1 View To Assess Tube Placement And Rule-out Pneumothorax  02/27/2014   CLINICAL DATA:  Tracheostomy tube placement. Acute respiratory failure with hypoxia.  EXAM: PORTABLE CHEST - 1 VIEW  COMPARISON:  02/25/2014  FINDINGS: A new tracheostomy tube is seen in appropriate position. No evidence of pneumomediastinum or pneumothorax.  Increased diffuse interstitial prominence is seen, suspicious for mild edema. No evidence of focal consolidation or pleural effusion. Heart size is stable. Right arm PICC line remains in appropriate position.  IMPRESSION: New tracheostomy tube in appropriate position. No evidence of pneumothorax.  Increased diffuse interstitial prominence suspicious for mild pulmonary  edema.   Electronically Signed   By: Myles Rosenthal M.D.   On: 02/27/2014 12:54   Dg Abd Portable 1v  02/27/2014   CLINICAL DATA:  Panda tube placement  EXAM: PORTABLE ABDOMEN - 1 VIEW  COMPARISON:  02/21/2014  FINDINGS: Enteric tubes passes below the diaphragm into the region of the distal stomach.  Normal bowel gas pattern.  Soft tissues are unremarkable.  IMPRESSION: Metal tip of the enteric tube lies in the distal stomach.   Electronically Signed   By: Amie Portland M.D.   On: 02/27/2014 15:42      ASSESSMENT / PLAN:  PULMONARY A:  Acute on chronic resp failure on account on severe agitation and including cord edema/stridor at reintubation 02/21/14. S/p trach 02/27/14   - P:   Vent bundle Post trach care    CARDIOVASCULAR PICC 02/23/14 ordered >> A:  Qtc 447 msec. >442   P:  Monitor    RENAL A:   No acute issue   P:    monitor I/O, avoid nephrotoxic meds Maintenance IV fluids  GASTROINTESTINAL A:   Nutrition   - TF held 02/24/14 but no residuals 02/25/14 and back on tube feeds P:   TF  SUP with pepcid  HEMATOLOGIC A:   Anemia of critical illness P:  monitor PRBBC for hgb <7gm%   INFECTIOUS A:   Possible  aspiration PNA. CXR w/ nad    - culture negative as of 02/25/14. PCT trend not cw sepsis   P:   BCx2 >> neg UC >>neg Sputum>> neg  Abx:  Unasyn 8/7 >>>8/11 - stop abx, monitor off abx   ENDOCRINE CBG (last 3)   Recent Labs  02/28/14 0409 02/28/14 0718 02/28/14 1130  GLUCAP 189* 206* 226*     A:   Low blood sugar, known DM on insulin(glucose now 166 8/9)  - Sugars  Again low 8/05/17/14 pm but ok on 02/26/14 8/15 : BS tr up on TF   P:   SSI Cont on lantus  D/c D5 IVF    NEUROLOGIC A:   Acute encephalopathy 2nd to hypoglycemia at admit 02/20/2014 -    - diprivan only drug that helps but on d5 diprivan CK climbing to 1400 despite fluids.  MRI brain pending 03/01/15  P:   Recheck CK 02/28/14 Start haldol 5mg  IV q6h; monitor QTc - ekg 02/27/14 Monitor mental status; RASS goal 0 to -2 [place] consult 02/25/14  Appreciated; needs MRI>bedside CT  Precedex trial    GLOBAL 02/25/14: d/w Mom at bedside: full code including LTAC and trach. Wants her placed. This is all likely hypoglycemia onset related agitated delirium. Neuro wants to rule out stroke as well.  02/26/14 Mom consented for trach by Dr Tyson Alias 02/27/14: no family at bedside. Needs placement  02/28/14 : mother updated       Tammy Parrett NP-C  [facility]  [phone]    PCCM ATTENDING: I have interviewed and examined the patient and reviewed the database. I have formulated the assessment and plan as reflected in the note above with amendments made by me. 35 mins of direct critical care time provided  Billy Fischer, MD;  PCCM service; Mobile [phone]  02/28/2014 12:39 PM

## 2014-02-28 NOTE — Op Note (Signed)
Nicole Mutton:  [name], [name]               ACCOUNT NO.:  [id number]  MEDICAL RECORD NO.:  [id number]  LOCATION:  2M06C                        FACILITY:  [facility]  PHYSICIAN:  Nelda Bucksaniel J Feinstein, MD DATE OF BIRTH:  11/05/1968  DATE OF PROCEDURE: DATE OF DISCHARGE:                              OPERATIVE REPORT   PROCEDURE:  Percutaneous tracheostomy.  DESCRIPTION OF PROCEDURE:  Consent was obtained from the patient's mother who fully aware of risks and benefits of procedure including infection, bleeding, pneumothorax, and death.  She also said the daughter also agreed with decision to have tracheostomy. Bronchoscopist for procedure was Dr. Kalman ShanMurali Ramaswamy, with first assistant, Canary BrimBrandi Ollis, NP.  The patient was placed in supine position.  Chlorhexidine preparation used to sterilize the operative site.  The bronchoscopist placed a bronchoscope through the endotracheal tube backed up to approximately 16 cm.  PREOPERATIVE DIAGNOSES:  Hypoglycemic brain injury, failure to protect the airway status post upper airway edema, re-intubation.  POSTOPERATIVE DIAGNOSES:  Status post tracheostomy secondary to inability to protect airway, hypoglycemic brain injury, brain and pharyngeal edema.  After ET tube was backed out under direct visualization, the entire procedure was noted.  7 mL of epinephrine plus lidocaine was injected over the second and third endotracheal space.  A 1 cm vertical incision was made.  Dissection made down to the tracheal planes.  Please note, this patient had a prior tracheostomy done and trach was removed probably a year ago.  The incision was made right through the old fibrinous scar in a vertical manner.  There was extensive amount of adhesions and fibrotic changes.  I was able to get down easily down to the tracheal wall and could feel the membrane with a prior tracheostomy is to be.  I then placed an 18-gauge needle white catheter sheath into the airway directly  visualized by the bronchoscopy without any posterior wall injury.  Needles removed, white catheter sheath remained and placed a wire through the white catheter sheath.  White catheter sheath was removed.  A 14-French punch dilator was placed in the airway.  A progressive rhino dilator approximately 30-French over a glider was placed in and out of the airway.  A size 6 tracheostomy placed over 26- French dilator into the airway.  Everything was removed except the tracheostomy.  The tracheostomy was then sutured in place with 4-0 monofilament sutures.  Blood loss to the procedure was less than 5 mL. The bronchoscopist placed a bronchoscope through the tracheostomy, noted carina approximately 2 cm below without any posterior wall injury or active bleeding.  Postoperative chest x-ray revealed a well placed tracheostomy.  The patient tolerated the procedure quite well without any significant complications.  The patient is to follow up in our tracheostomy clinic by calling [phone].     Nelda Bucksaniel J Feinstein, MD     DJF/MEDQ  D:  02/28/2014  T:  02/28/2014  Job:  [id number]

## 2014-03-01 LAB — GLUCOSE, CAPILLARY
GLUCOSE-CAPILLARY: 167 mg/dL — AB (ref 70–99)
GLUCOSE-CAPILLARY: 181 mg/dL — AB (ref 70–99)
GLUCOSE-CAPILLARY: 266 mg/dL — AB (ref 70–99)
GLUCOSE-CAPILLARY: 273 mg/dL — AB (ref 70–99)
Glucose-Capillary: 150 mg/dL — ABNORMAL HIGH (ref 70–99)
Glucose-Capillary: 127 mg/dL — ABNORMAL HIGH (ref 70–99)

## 2014-03-01 MED ORDER — QUETIAPINE FUMARATE 100 MG PO TABS
100.0000 mg | ORAL_TABLET | Freq: Two times a day (BID) | ORAL | Status: DC
  Administered 2014-03-01 – 2014-03-03 (×5): 100 mg
  Filled 2014-03-01 (×6): qty 1

## 2014-03-01 MED ORDER — METHADONE HCL 10 MG/ML PO CONC: 20.0000 mg | Freq: Three times a day (TID) | ORAL | Status: DC

## 2014-03-01 MED ORDER — WHITE PETROLATUM GEL
Status: AC
  Administered 2014-03-01: 10:00:00
  Filled 2014-03-01: qty 5

## 2014-03-01 MED ORDER — METHADONE HCL 10 MG PO TABS
20.0000 mg | ORAL_TABLET | Freq: Three times a day (TID) | ORAL | Status: DC
  Administered 2014-03-01 – 2014-03-03 (×6): 20 mg
  Filled 2014-03-01 (×5): qty 2

## 2014-03-01 MED ORDER — DEXMEDETOMIDINE HCL IN NACL 400 MCG/100ML IV SOLN
0.4000 ug/kg/h | INTRAVENOUS | Status: DC
  Administered 2014-03-01 – 2014-03-02 (×6): 1 ug/kg/h via INTRAVENOUS
  Administered 2014-03-02: 1.2 ug/kg/h via INTRAVENOUS
  Administered 2014-03-02 – 2014-03-03 (×3): 1 ug/kg/h via INTRAVENOUS
  Administered 2014-03-03 (×2): 1.2 ug/kg/h via INTRAVENOUS
  Administered 2014-03-04 (×2): 1 ug/kg/h via INTRAVENOUS
  Filled 2014-03-01 (×3): qty 100
  Filled 2014-03-01: qty 200
  Filled 2014-03-01 (×9): qty 100

## 2014-03-01 MED ORDER — CLONAZEPAM 0.1 MG/ML ORAL SUSPENSION
2.0000 mg | Freq: Two times a day (BID) | ORAL | Status: DC
  Filled 2014-03-01 (×2): qty 20

## 2014-03-01 MED ORDER — VALPROIC ACID 250 MG/5ML PO SYRP
500.0000 mg | ORAL_SOLUTION | Freq: Two times a day (BID) | ORAL | Status: DC
  Administered 2014-03-01 (×2): 500 mg
  Filled 2014-03-01 (×4): qty 10

## 2014-03-01 MED ORDER — CLONAZEPAM 0.5 MG PO TABS
2.0000 mg | ORAL_TABLET | Freq: Two times a day (BID) | ORAL | Status: DC
  Administered 2014-03-01 – 2014-03-04 (×7): 2 mg
  Filled 2014-03-01 (×7): qty 4

## 2014-03-01 MED ORDER — MIDAZOLAM HCL 2 MG/2ML IJ SOLN
INTRAMUSCULAR | Status: AC
Start: 2014-03-01 — End: 2014-03-01
  Administered 2014-03-01: 2 mg
  Filled 2014-03-01: qty 2

## 2014-03-01 MED ORDER — MIDAZOLAM HCL 2 MG/2ML IJ SOLN: 1.0000 mg | INTRAMUSCULAR | Status: DC | PRN

## 2014-03-01 NOTE — Progress Notes (Signed)
PULMONARY / CRITICAL CARE MEDICINE   Name: [name] MRN: [id number] DOB: 11/05/1968    ADMISSION DATE:  02/20/2014  REFERRING MD :  Dr. Deretha Emory  CHIEF COMPLAINT:  Extreme agitation and low blood sugar  INITIAL PRESENTATION:  48 yo female with altered mental status from hypoglycemia.  She has hx of DM and seizures.  Intubated for airway protection.   has a past medical history of Diabetes mellitus without complication.   has past surgical history that includes Tracheostomy; Appendectomy; and Tracheostomy.   STUDIES:  8/15 CT head: No acute intracranial abnormality. Mild sinusitis 8/15 EEG c/w mild encephalopathy, no seizures   SIGNIFICANT EVENTS: Intubated 02/20/14 Extubated 02/21/14 Reintubated 8/8 for stridor. Difficult intubation. 6.5 tube. 02/22/14: Sedated on vent 02/23/14: Overnight agitated and attempting to self extubated. Now deeply sedated with fent and diprivan gtt. Fever curve coming down 02/24/14: still very agitated on WUA; propofil changed to precedex 02/25/14: Neuro suspects hypoglycemia related encephalopathy. Want to rule out stroke. Mom indicated full code and SNF placement desire 02/26/14: climbing CK on diprivan to 1425; approx d5 of diprivan. Agitation continues. Neuro want MRI when feasible.  8/14 Trach   SUBJECTIVE/OVERNIGHT/INTERVAL HX 03/01/14: improved mentation, following simple commands intermittently this am CT head done yest w/ no acute process, EEG  W/ no seizures  Changed to precedex yesterday, intermittent agitation persists.  Failed weaning this am with increased WOB /tachpnea despite on precedex    VITAL SIGNS: Temp:  [97.2 F (36.2 C)-100.9 F (38.3 C)] 97.6 F (36.4 C) (08/16 0600) Pulse Rate:  [72-117] 82 (08/16 0812) Resp:  [16-19] 16 (08/16 0812) BP: (77-173)/(40-91) 120/68 mmHg (08/16 0812) SpO2:  [100 %] 100 % (08/16 0812) FiO2 (%):  [30 %] 30 % (08/16 0812) Weight:  [58.9 kg (129 lb 13.6 oz)] 58.9 kg (129 lb 13.6 oz) (08/16  0500) VENTILATOR SETTINGS: Vent Mode:  [-] PRVC FiO2 (%):  [30 %] 30 % Set Rate:  [16 bmp] 16 bmp Vt Set:  [550 mL] 550 mL PEEP:  [5 cmH20] 5 cmH20 Plateau Pressure:  [33 cmH20-39 cmH20] 38 cmH20 INTAKE / OUTPUT:  Intake/Output Summary (Last 24 hours) at 03/01/14 1019 Last data filed at 03/01/14 0600  Gross per 24 hour  Intake 2688.4 ml  Output   2500 ml  Net  188.4 ml    PHYSICAL EXAMINATION: General:  Chronically ill appearing on vent  Neuro: agitated intermittently , followed simple commands this  HEENT:  Atraumatic,  Neck: Trach midline , dsg clean /dry Cardiovascular:  RRR, no  murmur Lungs:  Coarse breath sounds bilaterally Abdomen:  Soft ,no guarding, present bowel sounds,TF at 40 cc/hr Musculoskeletal:  No gross deformities, no edema Skin:  Has multiple scabs and shallow ulcers with mild excoriations on the lower extremities, no frank purulent discharge  LABS:  PULMONARY No results found for this basename: PHART, PCO2, PCO2ART, PO2, PO2ART, HCO3, TCO2, O2SAT,  in the last 168 hours  CBC  Recent Labs Lab 02/26/14 0500 02/27/14 0405 02/28/14 0351  HGB 7.9* 7.8* 7.6*  HCT 24.3* 24.7* 23.3*  WBC 4.5 7.9 9.4  PLT 184 199 198    COAGULATION  Recent Labs Lab 02/26/14 1800  INR 1.10    CARDIAC    Recent Labs Lab 02/25/14 0413  TROPONINI <0.30    Recent Labs Lab 02/24/14 0418  PROBNP 340.7*     CHEMISTRY  Recent Labs Lab 02/24/14 0418 02/25/14 0411 02/26/14 0500 02/27/14 0405 02/28/14 0350 02/28/14 0351  NA 144 142 145  138  --  138  K 4.6 3.2* 3.7 4.6  --  4.2  CL 110 111 117* 110  --  106  CO2 21 19 18* 20  --  22  GLUCOSE 215* 173* 95 239*  --  195*  BUN 16 13 8 10   --  7  CREATININE 0.53 0.57 0.54 0.61  --  0.68  CALCIUM 7.2* 7.0* 7.4* 7.4*  --  8.1*  MG 1.6 1.8 1.9 1.8 1.6  --   PHOS 2.7 2.8 3.2 3.7  --  3.5   Estimated Creatinine Clearance: 69.5 ml/min (by C-G formula based on Cr of 0.68).   LIVER  Recent Labs Lab  02/26/14 1800  INR 1.10     INFECTIOUS  Recent Labs Lab 02/23/14 1035 02/24/14 0418 02/24/14 0423 02/25/14 0411  LATICACIDVEN  --   --  1.0  --   PROCALCITON 0.82 0.70  --  0.29     ENDOCRINE CBG (last 3)   Recent Labs  02/28/14 2353 03/01/14 0358 03/01/14 0711  GLUCAP 273* 167* 150*         IMAGING x48h Dg Chest Port 1 View  02/28/2014   CLINICAL DATA:  Evaluate tracheostomy  EXAM: PORTABLE CHEST - 1 VIEW  COMPARISON:  02/27/2014  FINDINGS: Tracheostomy is stable in well positioned. An enteric tube is new passing below the diaphragm and below the included field of view. Right PICC is stable with its tip in the region of the caval atrial junction.  Cardiac silhouette is normal in size. Normal mediastinal and hilar contours. Clear lungs.  IMPRESSION: 1. No acute cardiopulmonary disease. Interstitial prominence noted on the previous day's exam is no longer evident. 2. Tracheostomy tube is well positioned.   Electronically Signed   By: Amie Portland M.D.   On: 02/28/2014 07:55   Ct Portable Head W/o Cm  02/28/2014   CLINICAL DATA:  Altered mental status, diabetes. Hypoglycemia. Tracheostomy for airway protection.  EXAM: CT HEAD WITHOUT CONTRAST  TECHNIQUE: Contiguous axial images were obtained from the base of the skull through the vertex without intravenous contrast.  COMPARISON:  Head CT 02/20/2014  FINDINGS: Exam is degraded by patient head motion.  NG tube noted.  No acute intracranial hemorrhage. No focal mass lesion. No CT evidence of acute infarction. No midline shift or mass effect. No hydrocephalus. Basilar cisterns are patent. There is fluid in the ethmoid air cells and maxillary sinuses as well as fluid in the frontal sinuses.  IMPRESSION: 1. No acute intracranial findings. Exam is degraded by patient motion. 2. Fluid in the frontal sinuses and paranasal sinuses. This may relate to intubation and NG tube.   Electronically Signed   By: Genevive Bi M.D.   On:  02/28/2014 17:26    Dg Chest Port 1 View  02/28/2014   CLINICAL DATA:  Evaluate tracheostomy  EXAM: PORTABLE CHEST - 1 VIEW  COMPARISON:  02/27/2014  FINDINGS: Tracheostomy is stable in well positioned. An enteric tube is new passing below the diaphragm and below the included field of view. Right PICC is stable with its tip in the region of the caval atrial junction.  Cardiac silhouette is normal in size. Normal mediastinal and hilar contours. Clear lungs.  IMPRESSION: 1. No acute cardiopulmonary disease. Interstitial prominence noted on the previous day's exam is no longer evident. 2. Tracheostomy tube is well positioned.   Electronically Signed   By: Amie Portland M.D.   On: 02/28/2014 07:55   Chest Portable 1  View To Assess Tube Placement And Rule-out Pneumothorax  02/27/2014   CLINICAL DATA:  Tracheostomy tube placement. Acute respiratory failure with hypoxia.  EXAM: PORTABLE CHEST - 1 VIEW  COMPARISON:  02/25/2014  FINDINGS: A new tracheostomy tube is seen in appropriate position. No evidence of pneumomediastinum or pneumothorax.  Increased diffuse interstitial prominence is seen, suspicious for mild edema. No evidence of focal consolidation or pleural effusion. Heart size is stable. Right arm PICC line remains in appropriate position.  IMPRESSION: New tracheostomy tube in appropriate position. No evidence of pneumothorax.  Increased diffuse interstitial prominence suspicious for mild pulmonary edema.   Electronically Signed   By: Myles Rosenthal M.D.   On: 02/27/2014 12:54   Dg Abd Portable 1v  02/27/2014   CLINICAL DATA:  Panda tube placement  EXAM: PORTABLE ABDOMEN - 1 VIEW  COMPARISON:  02/21/2014  FINDINGS: Enteric tubes passes below the diaphragm into the region of the distal stomach.  Normal bowel gas pattern.  Soft tissues are unremarkable.  IMPRESSION: Metal tip of the enteric tube lies in the distal stomach.   Electronically Signed   By: Amie Portland M.D.   On: 02/27/2014 15:42   Ct Portable  Head W/o Cm  02/28/2014   CLINICAL DATA:  Altered mental status, diabetes. Hypoglycemia. Tracheostomy for airway protection.  EXAM: CT HEAD WITHOUT CONTRAST  TECHNIQUE: Contiguous axial images were obtained from the base of the skull through the vertex without intravenous contrast.  COMPARISON:  Head CT 02/20/2014  FINDINGS: Exam is degraded by patient head motion.  NG tube noted.  No acute intracranial hemorrhage. No focal mass lesion. No CT evidence of acute infarction. No midline shift or mass effect. No hydrocephalus. Basilar cisterns are patent. There is fluid in the ethmoid air cells and maxillary sinuses as well as fluid in the frontal sinuses.  IMPRESSION: 1. No acute intracranial findings. Exam is degraded by patient motion. 2. Fluid in the frontal sinuses and paranasal sinuses. This may relate to intubation and NG tube.   Electronically Signed   By: Genevive Bi M.D.   On: 02/28/2014 17:26      ASSESSMENT / PLAN:  PULMONARY A:  Acute on chronic resp failure on account on severe agitation and including cord edema/stridor at reintubation 02/21/14. S/p trach 02/27/14    - P:   Vent bundle, w/ daily SBT and assess for vent wean   trach care BD As needed     CARDIOVASCULAR PICC 02/23/14 ordered >> A:  Qtc 447 msec. >442   P:  Monitor qtc on haldol     RENAL A:   No acute issue   P:    monitor I/O, avoid nephrotoxic meds Maintenance IV fluids  GASTROINTESTINAL A:   Nutrition   - TF held 02/24/14 but no residuals 02/25/14 and back on tube feeds P:   TF  SUP with pepcid  HEMATOLOGIC A:   Anemia of critical illness P:  monitor PRBBC for hgb <7gm%   INFECTIOUS A:   Possible aspiration PNA. CXR w/ nad    - culture negative as of 02/25/14. PCT trend not cw sepsis   P:   BCx2 >> neg UC >>neg Sputum>> neg  Abx:  Unasyn 8/7 >>>8/11 - stop abx, monitor off abx   ENDOCRINE CBG (last 3)   Recent Labs  02/28/14 2353 03/01/14 0358 03/01/14 0711   GLUCAP 273* 167* 150*     A:   Low blood sugar, known DM on insulin(glucose now 166 8/9)  -  Sugars  Again low 8/05/17/14 pm but ok on 02/26/14   P:   SSI Cont on lantus      NEUROLOGIC A:   Acute encephalopathy 2nd to hypoglycemia at admit 02/20/2014 -  Propofol DC'd due to elevated CK P:   Recheck CK 8/17 Cont Precedex  DC Haldol Seroquel 100 mg BID initiated 8/16 Clonazepam 2 mg BID initiated 8/16 Valproic acid initiated 8/16 (for delirium, not seizure) Methadone initiated 8/16 Wean fent gtt to off as tolerated after above Neuro following    GLOBAL 02/25/14: d/w Mom at bedside: full code including LTAC and trach. Wants her placed. This is all likely hypoglycemia onset related agitated delirium. Neuro wants to rule out stroke as well.  02/26/14 Mom consented for trach by Dr Tyson AliasFeinstein 02/27/14: no family at bedside. Needs placement  02/28/14 : mother updated   Tammy Parrett NP-C  [facility]  [phone]   PCCM ATTENDING: I have interviewed and examined the patient and reviewed the database. I have formulated the assessment and plan as reflected in the note above with amendments made by me. 35 mins of direct critical care time provided  Billy Fischeravid Simonds, MD;  PCCM service; Mobile [phone]  03/01/2014 10:19 AM

## 2014-03-02 ENCOUNTER — Inpatient Hospital Stay (HOSPITAL_COMMUNITY): Payer: Medicaid Other

## 2014-03-02 LAB — CK TOTAL AND CKMB (NOT AT ARMC)
CK, MB: 2.6 ng/mL (ref 0.3–4.0)
Relative Index: 1.2 (ref 0.0–2.5)
Total CK: 218 U/L — ABNORMAL HIGH (ref 7–177)

## 2014-03-02 LAB — CULTURE, BLOOD (ROUTINE X 2)

## 2014-03-02 LAB — GLUCOSE, CAPILLARY
GLUCOSE-CAPILLARY: 119 mg/dL — AB (ref 70–99)
GLUCOSE-CAPILLARY: 146 mg/dL — AB (ref 70–99)
GLUCOSE-CAPILLARY: 176 mg/dL — AB (ref 70–99)
GLUCOSE-CAPILLARY: 177 mg/dL — AB (ref 70–99)
Glucose-Capillary: 164 mg/dL — ABNORMAL HIGH (ref 70–99)
Glucose-Capillary: 195 mg/dL — ABNORMAL HIGH (ref 70–99)

## 2014-03-02 LAB — CULTURE, RESPIRATORY

## 2014-03-02 LAB — CULTURE, RESPIRATORY W GRAM STAIN

## 2014-03-02 MED ORDER — CETYLPYRIDINIUM CHLORIDE 0.05 % MT LIQD
7.0000 mL | Freq: Four times a day (QID) | OROMUCOSAL | Status: DC
  Administered 2014-03-03 – 2014-03-04 (×7): 7 mL via OROMUCOSAL

## 2014-03-02 MED ORDER — CHLORHEXIDINE GLUCONATE 0.12 % MT SOLN
15.0000 mL | Freq: Two times a day (BID) | OROMUCOSAL | Status: DC
  Administered 2014-03-02 – 2014-03-04 (×4): 15 mL via OROMUCOSAL
  Filled 2014-03-02 (×4): qty 15

## 2014-03-02 MED ORDER — BISACODYL 10 MG RE SUPP
10.0000 mg | Freq: Every day | RECTAL | Status: DC | PRN
  Administered 2014-03-02: 10 mg via RECTAL
  Filled 2014-03-02: qty 1

## 2014-03-02 NOTE — Progress Notes (Signed)
NEURO HOSPITALIST PROGRESS NOTE   SUBJECTIVE:                                                                                                                        More alert today, less agitated. Off fentany. On precedex, seroquel, and clonazepam. Portable CT brain 8/15 unchanged, no acute abnormality. CK 218  OBJECTIVE:                                                                                                                           Vital signs in last 24 hours: Temp:  [95.6 F (35.3 C)-100.6 F (38.1 C)] 98.9 F (37.2 C) (08/17 1100) Pulse Rate:  [68-94] 80 (08/17 1100) Resp:  [16-17] 16 (08/17 1100) BP: (100-162)/(58-81) 143/75 mmHg (08/17 1100) SpO2:  [100 %] 100 % (08/17 1100) FiO2 (%):  [30 %] 30 % (08/17 1100) Weight:  [59.7 kg (131 lb 9.8 oz)] 59.7 kg (131 lb 9.8 oz) (08/17 0500)  Intake/Output from previous day: 08/16 0701 - 08/17 0700 In: 2721.2 [I.V.:1550.4; NG/GT:1170.8] Out: 3650 [Urine:3650] Intake/Output this shift: Total I/O In: 306.7 [I.V.:186.7; NG/GT:120] Out: 500 [Urine:500] Nutritional status:    Past Medical History  Diagnosis Date  . Diabetes mellitus without complication     Neurologic Exam:  Mental status: open eyes spontaneously but does not follow commands.  CN 2-12: resists eye exam, asymmetric pupils (prior surgery?), reactive. No right gaze preference. Face symmetric. Tongue: intubated.  Motor: moves right side and right leg spontaneously but less motor activity left arm  Sensory: reacts to pain.  DTR's: overactive mainly in he left side  Plantars: downgoing,.  Coordination and gait: unable to test   Lab Results: No results found for this basename: cbc, bmp, coags, chol, tri, ldl, hga1c   Lipid Panel No results found for this basename: CHOL, TRIG, HDL, CHOLHDL, VLDL, LDLCALC,  in the last 72 hours  Studies/Results: Dg Chest Port 1 View  03/02/2014   CLINICAL DATA:  Respiratory  failure.  EXAM: PORTABLE CHEST - 1 VIEW  COMPARISON:  02/28/2014.  FINDINGS: Tracheostomy is midline. Feeding tube is followed into the stomach with the tip projecting beyond the inferior margin of the image. Right PICC tip projects over the low SVC  or SVC RA junction. Heart size stable. Lungs are somewhat low in volume with probable mild bibasilar atelectasis. No definite pleural fluid.  IMPRESSION: Lungs are somewhat low in volume with probable mild bibasilar atelectasis.   Electronically Signed   By: Leanna BattlesMelinda  Blietz M.D.   On: 03/02/2014 07:01   Ct Portable Head W/o Cm  02/28/2014   CLINICAL DATA:  Altered mental status, diabetes. Hypoglycemia. Tracheostomy for airway protection.  EXAM: CT HEAD WITHOUT CONTRAST  TECHNIQUE: Contiguous axial images were obtained from the base of the skull through the vertex without intravenous contrast.  COMPARISON:  Head CT 02/20/2014  FINDINGS: Exam is degraded by patient head motion.  NG tube noted.  No acute intracranial hemorrhage. No focal mass lesion. No CT evidence of acute infarction. No midline shift or mass effect. No hydrocephalus. Basilar cisterns are patent. There is fluid in the ethmoid air cells and maxillary sinuses as well as fluid in the frontal sinuses.  IMPRESSION: 1. No acute intracranial findings. Exam is degraded by patient motion. 2. Fluid in the frontal sinuses and paranasal sinuses. This may relate to intubation and NG tube.   Electronically Signed   By: Genevive BiStewart  Edmunds M.D.   On: 02/28/2014 17:26    MEDICATIONS                                                                                                                        Scheduled: . antiseptic oral rinse  7 mL Mouth Rinse QID  . chlorhexidine  15 mL Mouth Rinse BID  . clonazePAM  2 mg Per Tube BID  . famotidine  20 mg Per Tube BID  . heparin  5,000 Units Subcutaneous 3 times per day  . insulin aspart  0-15 Units Subcutaneous 6 times per day  . insulin glargine  5 Units  Subcutaneous QHS  . methadone  20 mg Per Tube 3 times per day  . QUEtiapine  100 mg Per Tube BID  . senna-docusate  2 tablet Per Tube BID  . sodium chloride  1,000 mL Intravenous Once  . sodium chloride  10-40 mL Intracatheter Q12H    ASSESSMENT/PLAN:                                                                                                            48 y/o. female [facility] due to severe hypoglycemic encephalopathy and persistent agitated delirium, somewhat improved. Continue current management. Will follow up.  Nicole Portelasvaldo Royanne Warshaw, MD [facility] [phone]  03/02/2014, 11:29 AM

## 2014-03-02 NOTE — Progress Notes (Signed)
20ml Fentanyl infusion wasted in sink. Nicole GauzeMariah Norment RN witnessed.  Jacqulyn Canehristopher Scott Romani Wilbon RN, BSN, CCRN

## 2014-03-02 NOTE — Progress Notes (Signed)
PULMONARY / CRITICAL CARE MEDICINE   Name: [name] MRN: [id number] DOB: 14-Nov-1965    ADMISSION DATE:  02/20/2014  REFERRING MD :  Dr. Deretha Emory  CHIEF COMPLAINT:  Extreme agitation and low blood sugar  INITIAL PRESENTATION:  48 yo female with altered mental status from hypoglycemia.  She has hx of DM and seizures.  Intubated for airway protection.  STUDIES:  8/15 CT head: No acute intracranial abnormality. Mild sinusitis 8/15 EEG c/w mild encephalopathy, no seizures  8/16 ct head>>>neg acute, sinusitis  SIGNIFICANT EVENTS: Intubated 02/20/14 Extubated 02/21/14 Reintubated 8/8 for stridor. Difficult intubation. 6.5 tube. 02/22/14: Sedated on vent 02/23/14: Overnight agitated and attempting to self extubated. Now deeply sedated with fent and diprivan gtt. Fever curve coming down 02/24/14: still very agitated on WUA; propofil changed to precedex 02/25/14: Neuro suspects hypoglycemia related encephalopathy. Want to rule out stroke. Mom indicated full code and SNF placement desire 02/26/14: climbing CK on diprivan to 1425; approx d5 of diprivan. Agitation continues. Neuro want MRI when feasible.  8/14 Trach   SUBJECTIVE/OVERNIGHT/INTERVAL HX On sedation drips, maxed  VITAL SIGNS: Temp:  [95.6 F (35.3 C)-100.6 F (38.1 C)] 99.2 F (37.3 C) (08/17 0800) Pulse Rate:  [68-101] 80 (08/17 0800) Resp:  [16-19] 16 (08/17 0800) BP: (100-162)/(58-81) 137/67 mmHg (08/17 0800) SpO2:  [100 %] 100 % (08/17 0800) FiO2 (%):  [30 %] 30 % (08/17 0735) Weight:  [59.7 kg (131 lb 9.8 oz)] 59.7 kg (131 lb 9.8 oz) (08/17 0500) VENTILATOR SETTINGS: Vent Mode:  [-] PRVC FiO2 (%):  [30 %] 30 % Set Rate:  [16 bmp] 16 bmp Vt Set:  [550 mL] 550 mL PEEP:  [5 cmH20] 5 cmH20 Plateau Pressure:  [27 cmH20-33 cmH20] 27 cmH20 INTAKE / OUTPUT:  Intake/Output Summary (Last 24 hours) at 03/02/14 0947 Last data filed at 03/02/14 0800  Gross per 24 hour  Intake 2492.03 ml  Output   3875 ml  Net -1382.97 ml     PHYSICAL EXAMINATION: General:  Chronically ill appearing on vent  Neuro: agitated intermittently, rass -3 HEENT:  Atraumatic,  Neck: Trach midline looks clean Cardiovascular:  RRR, no  murmur Lungs:  Coarse breath sounds bilaterally Abdomen:  Soft ,no guarding, present bowel sounds Musculoskeletal:  No gross deformities, no edema Skin:  Has multiple scabs and shallow ulcers with mild excoriations on the lower extremities, no frank purulent discharge  LABS:  PULMONARY No results found for this basename: PHART, PCO2, PCO2ART, PO2, PO2ART, HCO3, TCO2, O2SAT,  in the last 168 hours  CBC  Recent Labs Lab 02/26/14 0500 02/27/14 0405 02/28/14 0351  HGB 7.9* 7.8* 7.6*  HCT 24.3* 24.7* 23.3*  WBC 4.5 7.9 9.4  PLT 184 199 198    COAGULATION  Recent Labs Lab 02/26/14 1800  INR 1.10    CARDIAC    Recent Labs Lab 02/25/14 0413  TROPONINI <0.30    Recent Labs Lab 02/24/14 0418  PROBNP 340.7*     CHEMISTRY  Recent Labs Lab 02/24/14 0418 02/25/14 0411 02/26/14 0500 02/27/14 0405 02/28/14 0350 02/28/14 0351  NA 144 142 145 138  --  138  K 4.6 3.2* 3.7 4.6  --  4.2  CL 110 111 117* 110  --  106  CO2 21 19 18* 20  --  22  GLUCOSE 215* 173* 95 239*  --  195*  BUN 16 13 8 10   --  7  CREATININE 0.53 0.57 0.54 0.61  --  0.68  CALCIUM 7.2* 7.0* 7.4*  7.4*  --  8.1*  MG 1.6 1.8 1.9 1.8 1.6  --   PHOS 2.7 2.8 3.2 3.7  --  3.5   Estimated Creatinine Clearance: 67.3 ml/min (by C-G formula based on Cr of 0.68).   LIVER  Recent Labs Lab 02/26/14 1800  INR 1.10     INFECTIOUS  Recent Labs Lab 02/23/14 1035 02/24/14 0418 02/24/14 0423 02/25/14 0411  LATICACIDVEN  --   --  1.0  --   PROCALCITON 0.82 0.70  --  0.29     ENDOCRINE CBG (last 3)   Recent Labs  03/01/14 2345 03/02/14 0348 03/02/14 0747  GLUCAP 146* 176* 164*    IMAGING x48h No results found.  Dg Chest Port 1 View  03/02/2014   CLINICAL DATA:  Respiratory failure.  EXAM:  PORTABLE CHEST - 1 VIEW  COMPARISON:  02/28/2014.  FINDINGS: Tracheostomy is midline. Feeding tube is followed into the stomach with the tip projecting beyond the inferior margin of the image. Right PICC tip projects over the low SVC or SVC RA junction. Heart size stable. Lungs are somewhat low in volume with probable mild bibasilar atelectasis. No definite pleural fluid.  IMPRESSION: Lungs are somewhat low in volume with probable mild bibasilar atelectasis.   Electronically Signed   By: Leanna BattlesMelinda  Blietz M.D.   On: 03/02/2014 07:01   Ct Portable Head W/o Cm  02/28/2014   CLINICAL DATA:  Altered mental status, diabetes. Hypoglycemia. Tracheostomy for airway protection.  EXAM: CT HEAD WITHOUT CONTRAST  TECHNIQUE: Contiguous axial images were obtained from the base of the skull through the vertex without intravenous contrast.  COMPARISON:  Head CT 02/20/2014  FINDINGS: Exam is degraded by patient head motion.  NG tube noted.  No acute intracranial hemorrhage. No focal mass lesion. No CT evidence of acute infarction. No midline shift or mass effect. No hydrocephalus. Basilar cisterns are patent. There is fluid in the ethmoid air cells and maxillary sinuses as well as fluid in the frontal sinuses.  IMPRESSION: 1. No acute intracranial findings. Exam is degraded by patient motion. 2. Fluid in the frontal sinuses and paranasal sinuses. This may relate to intubation and NG tube.   Electronically Signed   By: Genevive BiStewart  Edmunds M.D.   On: 02/28/2014 17:26    ASSESSMENT / PLAN:  PULMONARY A:  Acute on chronic resp failure on account on severe agitation and including cord edema/stridor at reintubation 02/21/14. S/p trach 02/27/14  P:   Keep same MV As fentanyl thi slow , if rass can improve then can sbt, cpap 5 ps 5, goal 30 min  pcxr reviewed, no repeat  CARDIOVASCULAR PICC 02/23/14 >>> A: multiple QTC meds  P:  Monitor qtc on ser, methadone tele ecg for qtc  RENAL A:   On methadone P:   kvo Chem in am    GASTROINTESTINAL A:   Nutrition No BM noted P:   TF to goal SUP with pepcid Hold off peg until we see if can go to trach collar fast dulc supp  HEMATOLOGIC A:   Anemia of critical illness P:  Sub q hep Cbc in 24 hrs  INFECTIOUS A:   Possible aspiration PNA treated P:   BCx2 >> neg UC >>neg Sputum>> neg  Abx:  Unasyn 8/7 >>>8/11 - stop abx monitor off abx   ENDOCRINE CBG (last 3)   Recent Labs  03/01/14 2345 03/02/14 0348 03/02/14 0747  GLUCAP 146* 176* 164*    A:   Low blood sugar,  known DM on insulin(glucose now 166 8/9)  - Sugars  Again low 8/05/17/14 pm but ok on 02/26/14  P:   SSI Cont on lantus  Tolerate glu as high 220 Will not move to part 2 of hyperglycemia as presentation was hypoglcyemia   NEUROLOGIC A:   Acute encephalopathy 2nd to hypoglycemia at admit 02/20/2014 -  Propofol DC'd due to elevated CK Refractory delirium P:   Cont Precedex, can max to 2 if needed Seroquel 100 mg BID initiated 8/16 Clonazepam 2 mg BID initiated 8/16 Valproic acid initiated 8/16 (for delirium, not seizure) - hold and assess status without as 3 age4nts added 8/16 Methadone initiated 8/16, 20 mg q8h, daily crt, ecg for qtc, may consider to 10 q6h in am  Wean fent gtt to off Neuro following   GLOBAL: see neuro, goal to dc fent and wean, check qtc   Ccm time 30 min   Mcarthur Rossetti. Tyson Alias, MD, FACP Pgr: [phone] [facility]

## 2014-03-03 LAB — BASIC METABOLIC PANEL
ANION GAP: 13 (ref 5–15)
BUN: 13 mg/dL (ref 6–23)
CHLORIDE: 106 meq/L (ref 96–112)
CO2: 21 mEq/L (ref 19–32)
Calcium: 8.3 mg/dL — ABNORMAL LOW (ref 8.4–10.5)
Creatinine, Ser: 0.59 mg/dL (ref 0.50–1.10)
GFR calc non Af Amer: >90 mL/min (ref >90)
Glucose, Bld: 208 mg/dL — ABNORMAL HIGH (ref 70–99)
POTASSIUM: 3.8 meq/L (ref 3.7–5.3)
Sodium: 140 mEq/L (ref 137–147)

## 2014-03-03 LAB — GLUCOSE, CAPILLARY
GLUCOSE-CAPILLARY: 178 mg/dL — AB (ref 70–99)
GLUCOSE-CAPILLARY: 227 mg/dL — AB (ref 70–99)
GLUCOSE-CAPILLARY: 232 mg/dL — AB (ref 70–99)
Glucose-Capillary: 208 mg/dL — ABNORMAL HIGH (ref 70–99)
Glucose-Capillary: 132 mg/dL — ABNORMAL HIGH (ref 70–99)
Glucose-Capillary: 156 mg/dL — ABNORMAL HIGH (ref 70–99)

## 2014-03-03 MED ORDER — METHADONE HCL 10 MG PO TABS
10.0000 mg | ORAL_TABLET | Freq: Three times a day (TID) | ORAL | Status: DC
  Administered 2014-03-03 – 2014-03-04 (×3): 10 mg
  Filled 2014-03-03 (×3): qty 1

## 2014-03-03 MED ORDER — SENNOSIDES-DOCUSATE SODIUM 8.6-50 MG PO TABS
1.0000 | ORAL_TABLET | Freq: Every day | ORAL | Status: DC
  Administered 2014-03-04: 1
  Filled 2014-03-03: qty 1

## 2014-03-03 MED ORDER — QUETIAPINE FUMARATE 200 MG PO TABS
200.0000 mg | ORAL_TABLET | Freq: Two times a day (BID) | ORAL | Status: DC
  Administered 2014-03-03 – 2014-03-04 (×2): 200 mg
  Filled 2014-03-03 (×3): qty 1

## 2014-03-03 MED ORDER — CEFAZOLIN SODIUM-DEXTROSE 2-3 GM-% IV SOLR
2.0000 g | Freq: Once | INTRAVENOUS | Status: AC
  Administered 2014-03-04: 2 g via INTRAVENOUS
  Filled 2014-03-03: qty 50

## 2014-03-03 MED ORDER — MAGIC MOUTHWASH
10.0000 mL | Freq: Three times a day (TID) | ORAL | Status: DC
  Administered 2014-03-03 – 2014-03-04 (×3): 10 mL via ORAL
  Filled 2014-03-03 (×5): qty 10

## 2014-03-03 NOTE — Progress Notes (Signed)
PULMONARY / CRITICAL CARE MEDICINE   Name: [name] MRN: [id number] DOB: 13-Jun-1966    ADMISSION DATE:  02/20/2014  REFERRING MD :  Dr. Deretha Emory  CHIEF COMPLAINT:  Extreme agitation and low blood sugar  INITIAL PRESENTATION:  48 yo female with altered mental status from hypoglycemia.  She has hx of DM and seizures.  Intubated for airway protection.  STUDIES:  8/15 CT head: No acute intracranial abnormality. Mild sinusitis 8/15 EEG c/w mild encephalopathy, no seizures  8/16 ct head>>>neg acute, sinusitis  SIGNIFICANT EVENTS: Intubated 02/20/14 Extubated 02/21/14 Reintubated 8/8 for stridor. Difficult intubation. 6.5 tube. 02/22/14: Sedated on vent 02/23/14: Overnight agitated and attempting to self extubated. Now deeply sedated with fent and diprivan gtt. Fever curve coming down 02/24/14: still very agitated on WUA; propofil changed to precedex 02/25/14: Neuro suspects hypoglycemia related encephalopathy. Want to rule out stroke. Mom indicated full code and SNF placement desire 02/26/14: climbing CK on diprivan to 1425; approx d5 of diprivan. Agitation continues. Neuro want MRI when feasible.  8/14 Trach  8/17- off fent and versed drips, precedex remains  SUBJECTIVE/OVERNIGHT/INTERVAL HX precedex on  VITAL SIGNS: Temp:  [97.8 F (36.6 C)-99 F (37.2 C)] 98.9 F (37.2 C) (08/18 1100) Pulse Rate:  [73-82] 82 (08/18 1100) Resp:  [0-17] 9 (08/18 1100) BP: (105-176)/(57-83) 105/61 mmHg (08/18 1100) SpO2:  [100 %] 100 % (08/18 1100) FiO2 (%):  [30 %] 30 % (08/18 1000) Weight:  [56.9 kg (125 lb 7.1 oz)] 56.9 kg (125 lb 7.1 oz) (08/18 0600) VENTILATOR SETTINGS: Vent Mode:  [-] PRVC FiO2 (%):  [30 %] 30 % Set Rate:  [16 bmp] 16 bmp Vt Set:  [550 mL] 550 mL PEEP:  [5 cmH20] 5 cmH20 Plateau Pressure:  [29 cmH20-32 cmH20] 32 cmH20 INTAKE / OUTPUT:  Intake/Output Summary (Last 24 hours) at 03/03/14 1141 Last data filed at 03/03/14 0900  Gross per 24 hour  Intake 1591.37 ml   Output   3010 ml  Net -1418.63 ml    PHYSICAL EXAMINATION: General:  Chronically ill appearing on vent  Neuro: agitated intermittently, rass -2 HEENT:  Atraumatic, trach wnl Neck: Trach midline looks clean Cardiovascular:  RRR, no  murmur Lungs:  coarse Abdomen:  Soft ,no guarding, present bowel sounds Musculoskeletal:  No gross deformities, no edema Skin:  Has multiple scabs and shallow ulcers with mild excoriations on the lower extremities, no frank purulent discharge  LABS:  PULMONARY No results found for this basename: PHART, PCO2, PCO2ART, PO2, PO2ART, HCO3, TCO2, O2SAT,  in the last 168 hours  CBC  Recent Labs Lab 02/26/14 0500 02/27/14 0405 02/28/14 0351  HGB 7.9* 7.8* 7.6*  HCT 24.3* 24.7* 23.3*  WBC 4.5 7.9 9.4  PLT 184 199 198    COAGULATION  Recent Labs Lab 02/26/14 1800  INR 1.10    CARDIAC    Recent Labs Lab 02/25/14 0413  TROPONINI <0.30   No results found for this basename: PROBNP,  in the last 168 hours   CHEMISTRY  Recent Labs Lab 02/25/14 0411 02/26/14 0500 02/27/14 0405 02/28/14 0350 02/28/14 0351 03/03/14 0420  NA 142 145 138  --  138 140  K 3.2* 3.7 4.6  --  4.2 3.8  CL 111 117* 110  --  106 106  CO2 19 18* 20  --  22 21  GLUCOSE 173* 95 239*  --  195* 208*  BUN 13 8 10   --  7 13  CREATININE 0.57 0.54 0.61  --  0.68  0.59  CALCIUM 7.0* 7.4* 7.4*  --  8.1* 8.3*  MG 1.8 1.9 1.8 1.6  --   --   PHOS 2.8 3.2 3.7  --  3.5  --    Estimated Creatinine Clearance: 67.3 ml/min (by C-G formula based on Cr of 0.59).   LIVER  Recent Labs Lab 02/26/14 1800  INR 1.10     INFECTIOUS  Recent Labs Lab 02/25/14 0411  PROCALCITON 0.29     ENDOCRINE CBG (last 3)   Recent Labs  03/02/14 2353 03/03/14 0409 03/03/14 0804  GLUCAP 232* 208* 132*    IMAGING x48h Dg Chest Port 1 View  03/02/2014   CLINICAL DATA:  Respiratory failure.  EXAM: PORTABLE CHEST - 1 VIEW  COMPARISON:  02/28/2014.  FINDINGS: Tracheostomy is  midline. Feeding tube is followed into the stomach with the tip projecting beyond the inferior margin of the image. Right PICC tip projects over the low SVC or SVC RA junction. Heart size stable. Lungs are somewhat low in volume with probable mild bibasilar atelectasis. No definite pleural fluid.  IMPRESSION: Lungs are somewhat low in volume with probable mild bibasilar atelectasis.   Electronically Signed   By: Leanna Battles M.D.   On: 03/02/2014 07:01    Dg Chest Port 1 View  03/02/2014   CLINICAL DATA:  Respiratory failure.  EXAM: PORTABLE CHEST - 1 VIEW  COMPARISON:  02/28/2014.  FINDINGS: Tracheostomy is midline. Feeding tube is followed into the stomach with the tip projecting beyond the inferior margin of the image. Right PICC tip projects over the low SVC or SVC RA junction. Heart size stable. Lungs are somewhat low in volume with probable mild bibasilar atelectasis. No definite pleural fluid.  IMPRESSION: Lungs are somewhat low in volume with probable mild bibasilar atelectasis.   Electronically Signed   By: Leanna Battles M.D.   On: 03/02/2014 07:01    ASSESSMENT / PLAN:  PULMONARY A:  Acute on chronic resp failure on account on severe agitation and including cord edema/stridor at reintubation 02/21/14. S/p trach 02/27/14  P:   Keep same MV We r off fent drip, aggressive weaning cpap 5 ps 10-12  - with some a[pnea, no TC as of now Continued lasix Apnea, reduce methadone  CARDIOVASCULAR PICC 02/23/14 >>> A: multiple QTC meds  P:  Monitor qtc on ser, methadone cosnider reduction tele QTC frequent qshift RN  RENAL A:   On methadone pulm edema component P:   kvo Chem in am alow neg balance to continue on own  GASTROINTESTINAL A:   Nutrition BM noted P:   TF to goal SUP with pepcid Poor progress weaning, - PEG order dulc supp-dc  HEMATOLOGIC A:   Anemia of critical illness P:  Sub q hep Cbc in am needed  INFECTIOUS A:   Possible aspiration PNA treated P:    BCx2 >> neg UC >>neg Sputum>> neg  Abx:  Unasyn 8/7 >>>8/11 - stop abx monitor off abx  ENDOCRINE CBG (last 3)   Recent Labs  03/02/14 2353 03/03/14 0409 03/03/14 0804  GLUCAP 232* 208* 132*    A:   Low blood sugar, known DM on insulin(glucose now 166 8/9)  - Sugars  Again low 8/05/17/14 pm but ok on 02/26/14  P:   SSI Cont on lantus, same dose, low is 132 Tolerate glu as high 220   NEUROLOGIC A:   Acute encephalopathy 2nd to hypoglycemia at admit 02/20/2014 -  Propofol DC'd due to elevated CK Refractory delirium  P:   Cont Precedex, goal to dc this today Seroquel 100 mg BID initiated 8/16, increase 200 Clonazepam 2 mg BID initiated 8/16 Valproic acid, may need to restart Methadone reduce , apnea Neuro following Goal to dc precedex  GLOBAL:  Wean ps 10-15 attempts, allow rr 35-40 (neuro), apnea at times, reduce methadone, goal dc precedex  Ccm time 30 min   Mcarthur Rossettianiel J. Tyson AliasFeinstein, MD, FACP Pgr: [phone] [facility]

## 2014-03-03 NOTE — H&P (Signed)
[name] is an 48 y.o. female.   Chief Complaint: Long time diabetic In and out of hospital many times Hypoglycemic event Admitted with altered mental status Now vent/trach- acute on chronic resp failure Severe agitation- intubated/extubated/intubated again- cord edema Malnutrition; long term care Request made from CCM Dr Titus Mould for percutaneous gastric tube placement Dr Barbie Banner has reviewed imaging and chart I have seen and examined pt Now scheduled for perc G tube in IR 8/19  HPI: Long time diab; anoxic event; AMS- on trach/vent  Past Medical History  Diagnosis Date  . Diabetes mellitus without complication     Past Surgical History  Procedure Laterality Date  . Tracheostomy    . Appendectomy    . Tracheostomy      feinstein    History reviewed. No pertinent family history. Social History:  reports that she has quit smoking. She does not have any smokeless tobacco history on file. She reports that she does not drink alcohol or use illicit drugs.  Allergies: No Known Allergies  Medications Prior to Admission  Medication Sig Dispense Refill  . albuterol (PROVENTIL HFA;VENTOLIN HFA) 108 (90 BASE) MCG/ACT inhaler Inhale 2 puffs into the lungs every 4 (four) hours as needed for wheezing or shortness of breath.      Marland Kitchen glucagon 1 MG injection Inject 1 mg into the vein once as needed (for emergent hypoglycemia).      . insulin glargine (LANTUS) 100 UNIT/ML injection Inject 7 Units into the skin 2 (two) times daily.      Marland Kitchen latanoprost (XALATAN) 0.005 % ophthalmic solution Place 1 drop into both eyes at bedtime.        Results for orders placed during the hospital encounter of 02/20/14 (from the past 48 hour(s))  GLUCOSE, CAPILLARY     Status: Abnormal   Collection Time    03/01/14  7:27 PM      Result Value Ref Range   Glucose-Capillary 266 (*) 70 - 99 mg/dL   Comment 1 Notify RN    GLUCOSE, CAPILLARY     Status: Abnormal   Collection Time    03/01/14 11:45 PM   Result Value Ref Range   Glucose-Capillary 146 (*) 70 - 99 mg/dL   Comment 1 Notify RN    GLUCOSE, CAPILLARY     Status: Abnormal   Collection Time    03/02/14  3:48 AM      Result Value Ref Range   Glucose-Capillary 176 (*) 70 - 99 mg/dL   Comment 1 Notify RN    CK TOTAL AND CKMB     Status: Abnormal   Collection Time    03/02/14  4:44 AM      Result Value Ref Range   Total CK 218 (*) 7 - 177 U/L   CK, MB 2.6  0.3 - 4.0 ng/mL   Relative Index 1.2  0.0 - 2.5  GLUCOSE, CAPILLARY     Status: Abnormal   Collection Time    03/02/14  7:47 AM      Result Value Ref Range   Glucose-Capillary 164 (*) 70 - 99 mg/dL  GLUCOSE, CAPILLARY     Status: Abnormal   Collection Time    03/02/14 12:41 PM      Result Value Ref Range   Glucose-Capillary 177 (*) 70 - 99 mg/dL  GLUCOSE, CAPILLARY     Status: Abnormal   Collection Time    03/02/14  4:26 PM      Result Value Ref Range  Glucose-Capillary 119 (*) 70 - 99 mg/dL  GLUCOSE, CAPILLARY     Status: Abnormal   Collection Time    03/02/14  7:32 PM      Result Value Ref Range   Glucose-Capillary 195 (*) 70 - 99 mg/dL  GLUCOSE, CAPILLARY     Status: Abnormal   Collection Time    03/02/14 11:53 PM      Result Value Ref Range   Glucose-Capillary 232 (*) 70 - 99 mg/dL   Comment 1 Documented in Chart     Comment 2 Notify RN    GLUCOSE, CAPILLARY     Status: Abnormal   Collection Time    03/03/14  4:09 AM      Result Value Ref Range   Glucose-Capillary 208 (*) 70 - 99 mg/dL   Comment 1 Documented in Chart     Comment 2 Notify RN    BASIC METABOLIC PANEL     Status: Abnormal   Collection Time    03/03/14  4:20 AM      Result Value Ref Range   Sodium 140  137 - 147 mEq/L   Potassium 3.8  3.7 - 5.3 mEq/L   Chloride 106  96 - 112 mEq/L   CO2 21  19 - 32 mEq/L   Glucose, Bld 208 (*) 70 - 99 mg/dL   BUN 13  6 - 23 mg/dL   Creatinine, Ser 0.59  0.50 - 1.10 mg/dL   Calcium 8.3 (*) 8.4 - 10.5 mg/dL   GFR calc non Af Amer >90  >90 mL/min    GFR calc Af Amer >90  >90 mL/min   Comment: (NOTE)     The eGFR has been calculated using the CKD EPI equation.     This calculation has not been validated in all clinical situations.     eGFR's persistently <90 mL/min signify possible Chronic Kidney     Disease.   Anion gap 13  5 - 15  GLUCOSE, CAPILLARY     Status: Abnormal   Collection Time    03/03/14  8:04 AM      Result Value Ref Range   Glucose-Capillary 132 (*) 70 - 99 mg/dL  GLUCOSE, CAPILLARY     Status: Abnormal   Collection Time    03/03/14 12:20 PM      Result Value Ref Range   Glucose-Capillary 156 (*) 70 - 99 mg/dL   Dg Chest Port 1 View  03/02/2014   CLINICAL DATA:  Respiratory failure.  EXAM: PORTABLE CHEST - 1 VIEW  COMPARISON:  02/28/2014.  FINDINGS: Tracheostomy is midline. Feeding tube is followed into the stomach with the tip projecting beyond the inferior margin of the image. Right PICC tip projects over the low SVC or SVC RA junction. Heart size stable. Lungs are somewhat low in volume with probable mild bibasilar atelectasis. No definite pleural fluid.  IMPRESSION: Lungs are somewhat low in volume with probable mild bibasilar atelectasis.   Electronically Signed   By: Lorin Picket M.D.   On: 03/02/2014 07:01    Review of Systems  Constitutional: Positive for weight loss. Negative for fever.  Neurological: Positive for weakness.    Blood pressure 121/67, pulse 81, temperature 99.5 F (37.5 C), temperature source Oral, resp. rate 16, height $RemoveBe'5\' 2"'wDKHUtIga$  (1.575 m), weight 56.9 kg (125 lb 7.1 oz), last menstrual period 02/05/2014, SpO2 100.00%. Physical Exam  Constitutional: She appears well-developed.  On vent/trach  Cardiovascular: Normal rate.   No murmur heard. Respiratory: Effort  normal. She has wheezes.  GI: Soft. Bowel sounds are normal.  Musculoskeletal:  No response  Neurological:  No movement  Skin: Skin is warm and dry.  Psychiatric:  Consented with mother via phone     Assessment/Plan AMS/  hypoglycemic event Severe agitation; traumatic intubation/extubation/intubation--cord edema Trach/vent Malnutrition Scheduled for percutaneous gastric tube placement pts mother aware of procedure benefits and risks and agreeable to proceed Consent signed andin chart Hold Hep inj Ancef on call  Shaft A 03/03/2014, 3:11 PM

## 2014-03-04 ENCOUNTER — Inpatient Hospital Stay (HOSPITAL_COMMUNITY): Payer: Medicaid Other

## 2014-03-04 ENCOUNTER — Inpatient Hospital Stay
Admission: AD | Admit: 2014-03-04 | Discharge: 2014-04-09 | Disposition: A | Discharge status: Skilled Nursing Facility | Payer: Medicaid Other | Source: Ambulatory Visit | Attending: Internal Medicine | Admitting: Internal Medicine

## 2014-03-04 DIAGNOSIS — J9620 Acute and chronic respiratory failure, unspecified whether with hypoxia or hypercapnia: Secondary | ICD-10-CM

## 2014-03-04 DIAGNOSIS — J9601 Acute respiratory failure with hypoxia: Secondary | ICD-10-CM | POA: Diagnosis present

## 2014-03-04 DIAGNOSIS — E162 Hypoglycemia, unspecified: Secondary | ICD-10-CM | POA: Diagnosis present

## 2014-03-04 DIAGNOSIS — G9340 Encephalopathy, unspecified: Secondary | ICD-10-CM | POA: Diagnosis present

## 2014-03-04 DIAGNOSIS — Z931 Gastrostomy status: Secondary | ICD-10-CM

## 2014-03-04 DIAGNOSIS — J9622 Acute and chronic respiratory failure with hypercapnia: Secondary | ICD-10-CM

## 2014-03-04 DIAGNOSIS — Z9911 Dependence on respirator [ventilator] status: Secondary | ICD-10-CM

## 2014-03-04 DIAGNOSIS — Z930 Tracheostomy status: Secondary | ICD-10-CM

## 2014-03-04 DIAGNOSIS — R4182 Altered mental status, unspecified: Secondary | ICD-10-CM | POA: Diagnosis present

## 2014-03-04 LAB — GLUCOSE, CAPILLARY
GLUCOSE-CAPILLARY: 147 mg/dL — AB (ref 70–99)
GLUCOSE-CAPILLARY: 59 mg/dL — AB (ref 70–99)
Glucose-Capillary: 162 mg/dL — ABNORMAL HIGH (ref 70–99)
Glucose-Capillary: 201 mg/dL — ABNORMAL HIGH (ref 70–99)
Glucose-Capillary: 274 mg/dL — ABNORMAL HIGH (ref 70–99)

## 2014-03-04 LAB — BASIC METABOLIC PANEL
ANION GAP: 14 (ref 5–15)
BUN: 15 mg/dL (ref 6–23)
CHLORIDE: 100 meq/L (ref 96–112)
CO2: 21 mEq/L (ref 19–32)
Calcium: 8.5 mg/dL (ref 8.4–10.5)
Creatinine, Ser: 0.65 mg/dL (ref 0.50–1.10)
GFR calc Af Amer: >90 mL/min (ref >90)
GFR calc non Af Amer: >90 mL/min (ref >90)
Glucose, Bld: 289 mg/dL — ABNORMAL HIGH (ref 70–99)
POTASSIUM: 4.2 meq/L (ref 3.7–5.3)
SODIUM: 135 meq/L — AB (ref 137–147)

## 2014-03-04 LAB — CBC WITH DIFFERENTIAL/PLATELET
Basophils Absolute: 0 10*3/uL (ref 0.0–0.1)
Basophils Relative: 0 % (ref 0–1)
EOS ABS: 0.2 10*3/uL (ref 0.0–0.7)
Eosinophils Relative: 2 % (ref 0–5)
HCT: 23 % — ABNORMAL LOW (ref 36.0–46.0)
HEMOGLOBIN: 7.4 g/dL — AB (ref 12.0–15.0)
LYMPHS ABS: 1.4 10*3/uL (ref 0.7–4.0)
Lymphocytes Relative: 15 % (ref 12–46)
MCH: 19.9 pg — ABNORMAL LOW (ref 26.0–34.0)
MCHC: 32.2 g/dL (ref 30.0–36.0)
MCV: 61.8 fL — ABNORMAL LOW (ref 78.0–100.0)
MONOS PCT: 9 % (ref 3–12)
Monocytes Absolute: 0.8 10*3/uL (ref 0.1–1.0)
Neutro Abs: 6.6 10*3/uL (ref 1.7–7.7)
Neutrophils Relative %: 74 % (ref 43–77)
Platelets: 312 10*3/uL (ref 150–400)
RBC: 3.72 MIL/uL — AB (ref 3.87–5.11)
RDW: 15.7 % — ABNORMAL HIGH (ref 11.5–15.5)
WBC: 9 10*3/uL (ref 4.0–10.5)

## 2014-03-04 LAB — PROTIME-INR
INR: 1.05 (ref 0.00–1.49)
Prothrombin Time: 13.7 seconds (ref 11.6–15.2)

## 2014-03-04 MED ORDER — HEPARIN SODIUM (PORCINE) 5000 UNIT/ML IJ SOLN: 5000.0000 [IU] | Freq: Three times a day (TID) | INTRAMUSCULAR | Status: DC

## 2014-03-04 MED ORDER — SODIUM CHLORIDE 0.9 % IV SOLN: 250.0000 mL | INTRAVENOUS | Status: DC | PRN

## 2014-03-04 MED ORDER — HALOPERIDOL LACTATE 5 MG/ML IJ SOLN: 2.5000 mg | Freq: Four times a day (QID) | INTRAMUSCULAR | Status: DC | PRN

## 2014-03-04 MED ORDER — DEXMEDETOMIDINE HCL IN NACL 400 MCG/100ML IV SOLN: 0.4000 ug/kg/h | INTRAVENOUS | Status: DC

## 2014-03-04 MED ORDER — SENNOSIDES-DOCUSATE SODIUM 8.6-50 MG PO TABS: 1.0000 | ORAL_TABLET | Freq: Every day | ORAL | Status: DC

## 2014-03-04 MED ORDER — INSULIN ASPART 100 UNIT/ML ~~LOC~~ SOLN: 0.0000 [IU] | SUBCUTANEOUS | Status: DC

## 2014-03-04 MED ORDER — FENTANYL CITRATE 0.05 MG/ML IJ SOLN: 25.0000 ug | INTRAMUSCULAR | Status: DC | PRN

## 2014-03-04 MED ORDER — CHLORHEXIDINE GLUCONATE 0.12 % MT SOLN: 15.0000 mL | Freq: Two times a day (BID) | OROMUCOSAL | Status: DC

## 2014-03-04 MED ORDER — CETYLPYRIDINIUM CHLORIDE 0.05 % MT LIQD: 7.0000 mL | Freq: Four times a day (QID) | OROMUCOSAL | Status: DC

## 2014-03-04 MED ORDER — METHADONE HCL 10 MG PO TABS: 10.0000 mg | ORAL_TABLET | Freq: Two times a day (BID) | ORAL | Status: DC

## 2014-03-04 MED ORDER — QUETIAPINE FUMARATE 200 MG PO TABS: 200.0000 mg | ORAL_TABLET | Freq: Two times a day (BID) | ORAL | Status: DC

## 2014-03-04 MED ORDER — FAMOTIDINE 40 MG/5ML PO SUSR: 20.0000 mg | Freq: Two times a day (BID) | ORAL | Status: DC

## 2014-03-04 MED ORDER — DEXTROSE 50 % IV SOLN
50.0000 mL | Freq: Once | INTRAVENOUS | Status: AC | PRN
  Administered 2014-03-04: 50 mL via INTRAVENOUS

## 2014-03-04 MED ORDER — MAGIC MOUTHWASH: 10.0000 mL | Freq: Three times a day (TID) | ORAL | Status: AC

## 2014-03-04 MED ORDER — DEXTROSE 50 % IV SOLN
INTRAVENOUS | Status: AC
  Filled 2014-03-04: qty 50

## 2014-03-04 MED ORDER — IOHEXOL 300 MG/ML  SOLN
50.0000 mL | Freq: Once | INTRAMUSCULAR | Status: AC | PRN
  Administered 2014-03-04: 20 mL

## 2014-03-04 MED ORDER — ALBUTEROL SULFATE (2.5 MG/3ML) 0.083% IN NEBU: 2.5000 mg | INHALATION_SOLUTION | RESPIRATORY_TRACT | Status: DC | PRN

## 2014-03-04 MED ORDER — ACETAMINOPHEN 160 MG/5ML PO SOLN: 650.0000 mg | Freq: Four times a day (QID) | ORAL | Status: DC | PRN

## 2014-03-04 MED ORDER — CLONAZEPAM 2 MG PO TABS: 2.0000 mg | ORAL_TABLET | Freq: Two times a day (BID) | ORAL | Status: DC

## 2014-03-04 MED ORDER — INSULIN GLARGINE 100 UNIT/ML ~~LOC~~ SOLN: 5.0000 [IU] | Freq: Every day | SUBCUTANEOUS | Status: DC

## 2014-03-04 MED ORDER — VITAL AF 1.2 CAL PO LIQD: 1000.0000 mL | ORAL | Status: AC

## 2014-03-04 NOTE — Procedures (Signed)
Procedure:  Gastrostomy tube placement Findings:  20 Fr bumper retention gastrostomy tube placed.  Tip in body of stomach.

## 2014-03-04 NOTE — Progress Notes (Signed)
Called report to [facility], room [phone]. Pt will be taken after PEG tube placement.

## 2014-03-04 NOTE — Sedation Documentation (Signed)
CBG 162 

## 2014-03-04 NOTE — Progress Notes (Signed)
PULMONARY / CRITICAL CARE MEDICINE   Name: [name] MRN: [id number] DOB: 1966-06-08    ADMISSION DATE:  02/20/2014  REFERRING MD :  Dr. Deretha EmoryZackowski  CHIEF COMPLAINT:  Extreme agitation and low blood sugar  INITIAL PRESENTATION:  48 yo female with altered mental status from hypoglycemia.  She has hx of DM and seizures.  Intubated for airway protection.  STUDIES:  8/15 CT head: No acute intracranial abnormality. Mild sinusitis 8/15 EEG c/w mild encephalopathy, no seizures  8/16 ct head>>>neg acute, sinusitis  SIGNIFICANT EVENTS: Intubated 02/20/14 Extubated 02/21/14 Reintubated 8/8 for stridor. Difficult intubation. 6.5 tube. 02/22/14: Sedated on vent 02/23/14: Overnight agitated and attempting to self extubated. Now deeply sedated with fent and diprivan gtt. Fever curve coming down 02/24/14: still very agitated on WUA; propofil changed to precedex 02/25/14: Neuro suspects hypoglycemia related encephalopathy. Want to rule out stroke. Mom indicated full code and SNF placement desire 02/26/14: climbing CK on diprivan to 1425; approx d5 of diprivan. Agitation continues. Neuro want MRI when feasible.  8/14 Trach  8/17- off fent and versed drips, precedex remains 8/19- followed commands!!!!  SUBJECTIVE/OVERNIGHT/INTERVAL HX precedex remains  VITAL SIGNS: Temp:  [97.4 F (36.3 C)-99.9 F (37.7 C)] 97.6 F (36.4 C) (08/19 0800) Pulse Rate:  [71-85] 72 (08/19 0800) Resp:  [0-21] 16 (08/19 0855) BP: (87-141)/(55-74) 108/64 mmHg (08/19 0855) SpO2:  [100 %] 100 % (08/19 0800) FiO2 (%):  [30 %] 30 % (08/19 0855) Weight:  [55.9 kg (123 lb 3.8 oz)] 55.9 kg (123 lb 3.8 oz) (08/19 0600) VENTILATOR SETTINGS: Vent Mode:  [-] PRVC FiO2 (%):  [30 %] 30 % Set Rate:  [16 bmp] 16 bmp Vt Set:  [550 mL] 550 mL PEEP:  [5 cmH20] 5 cmH20 Plateau Pressure:  [28 cmH20-33 cmH20] 28 cmH20 INTAKE / OUTPUT:  Intake/Output Summary (Last 24 hours) at 03/04/14 1006 Last data filed at 03/04/14 0800  Gross  per 24 hour  Intake 1175.1 ml  Output   2515 ml  Net -1339.9 ml    PHYSICAL EXAMINATION: General:  Chronically ill appearing on vent  Neuro: agitated intermittently, rass -1, RN reports following commands HEENT:  Atraumatic, trach wnl Neck: Trach midline looks clean Cardiovascular:  RRR, no  murmur Lungs:  Coarse resolved, CTA Abdomen:  Soft ,no guarding, present bowel sounds Musculoskeletal:  No gross deformities, no edema Skin:  Has multiple scabs and shallow ulcers with mild excoriations on the lower extremities, no frank purulent discharge  LABS:  PULMONARY No results found for this basename: PHART, PCO2, PCO2ART, PO2, PO2ART, HCO3, TCO2, O2SAT,  in the last 168 hours  CBC  Recent Labs Lab 02/27/14 0405 02/28/14 0351 03/04/14 0415  HGB 7.8* 7.6* 7.4*  HCT 24.7* 23.3* 23.0*  WBC 7.9 9.4 9.0  PLT 199 198 312    COAGULATION  Recent Labs Lab 02/26/14 1800 03/04/14 0415  INR 1.10 1.05    CARDIAC   No results found for this basename: TROPONINI,  in the last 168 hours No results found for this basename: PROBNP,  in the last 168 hours   CHEMISTRY  Recent Labs Lab 02/26/14 0500 02/27/14 0405 02/28/14 0350 02/28/14 0351 03/03/14 0420 03/04/14 0415  NA 145 138  --  138 140 135*  K 3.7 4.6  --  4.2 3.8 4.2  CL 117* 110  --  106 106 100  CO2 18* 20  --  22 21 21   GLUCOSE 95 239*  --  195* 208* 289*  BUN 8 10  --  7 13 15   CREATININE 0.54 0.61  --  0.68 0.59 0.65  CALCIUM 7.4* 7.4*  --  8.1* 8.3* 8.5  MG 1.9 1.8 1.6  --   --   --   PHOS 3.2 3.7  --  3.5  --   --    Estimated Creatinine Clearance: 67.3 ml/min (by C-G formula based on Cr of 0.65).   LIVER  Recent Labs Lab 02/26/14 1800 03/04/14 0415  INR 1.10 1.05     INFECTIOUS No results found for this basename: LATICACIDVEN, PROCALCITON,  in the last 168 hours   ENDOCRINE CBG (last 3)   Recent Labs  03/04/14 03/04/14 0403 03/04/14 0800  GLUCAP 201* 274* 147*    IMAGING x48h No  results found.  No results found.  ASSESSMENT / PLAN:  PULMONARY A:  Acute on chronic resp failure on account on severe agitation and including cord edema/stridor at reintubation 02/21/14. S/p trach 02/27/14  P:   Keep same MV Continued neg balance goals Apnea, reduce methadone further and repeat weaning attempts, cpap 5 ps 10, goal 4-6 hrs Consider pcxr in 48 hr  CARDIOVASCULAR PICC 02/23/14 >>> A: multiple QTC meds  P:  Monitor qtc on ser, methadone reduce further tele QTC frequent qshift RN  RENAL A:   On methadone pulm edema component Neg balance on own daily P:   kvo Chem in am Allow neg balance, if autodiuresis stops, add lasix  GASTROINTESTINAL A:   Nutrition BM noted P:   TF on hold SUP with pepcid PEG today  HEMATOLOGIC A:   Anemia of critical illness P:  Sub q hep Limit phlebotomy when able  INFECTIOUS A:   Possible aspiration PNA treated P:   BCx2 >> neg UC >>neg Sputum>> neg  Abx:  Unasyn 8/7 >>>8/11 - stop abx monitor off abx  ENDOCRINE CBG (last 3)   Recent Labs  03/04/14 03/04/14 0403 03/04/14 0800  GLUCAP 201* 274* 147*    A:   Low blood sugar, known DM on insulin(glucose now 166 8/9)  - Sugars  Again low 8/05/17/14 pm but ok on 02/26/14  P:   SSI Cont on lantus, same dose, low is 147, not increase   NEUROLOGIC A:   Acute encephalopathy 2nd to hypoglycemia at admit 02/20/2014 -  Propofol DC'd due to elevated CK Refractory delirium P:   Cont Precedex, goal to dc this today Seroquel 100 mg BID initiated 8/16, increase 200, consider in crease in am if not off precedex Clonazepam 2 mg BID initiated 8/16 Methadone reduce , apnea - reduce to bid Goal to dc precedex today after peg, add haldol scheduled with prior qtc assessments  GLOBAL:  Reduce methadone further, wean cpap ps 5-10, want neg balance further  Ccm time 30 min   Mcarthur Rossetti. Tyson Alias, MD, FACP Pgr: [phone] [facility]

## 2014-03-04 NOTE — Discharge Summary (Signed)
Physician Discharge Summary       Patient ID: [name] MRN: [id number] DOB/AGE: Aug 23, 1965 48 y.o.  Admit date: 02/20/2014 Discharge date: 03/04/2014  Discharge Diagnoses:  Acute on chronic resp failure due to severe agitation and including cord edema/stridor at reintubation 02/21/14. S/p trach 02/27/14  Prolonged QTC Dysphagia, s/p PEG 8/19 Anemia of critical illness Hypoglycemia/ Hypoglycemic event  DM Acute encephalopathy  Refractory delirium Aspiration pneumonia   Detailed Hospital Course:   77 yof with known DM on insulin and possible seizure disorder. On 8/7 became acutely confused and had altered mental status, her husband called EMS who found that her blood sugar was about 22. She was given glucagon and subsequent fingerstick was around 80-90. Apparently also she had some pieces of chicken in her mouth, unclear whether she had any aspiration episode. In the ED she was awake but very belligerent and confused, despite multiple ativan and high dose haldol, due to extreme interference with care, she was intubated. She did have a fever 101.9 rectally. CXR was clear, CT head also unremarkable other than mild sinusitis. There was fair amount of clear secretions out of her ET tube. Of note, she was also in the ED about 4 days prior due to similar reason, where she was also found to have very low blood sugar, which was treated and patient improved.   She was admitted to the intensive care. Therapeutic interventions included: mechanical ventilation, we obtained sputum cultures and started empiric unasyn for possible aspiration, and started nutritional support to stabilize hypoglycemia. She was extubated on 8/8, but required re-intubation for stridor. This was noted to be a difficult airway and we were only able to pass a 6.5 tube at that point. She remained agitated. Required heavy sedation as she kept attempting to self extubate. On 8/11 her fever curve had improved. We consulted neurology as  she had persistent delirium in spite of treatment and supportive care. They felt that the ongoing agitation and delirium was due to hypoglycemia related encephalopathy. She had been maintained of propofol for her agitation but this was d/cd on 8/13 due to climbing tCKs. Given her persistent delirium we went ahead with tracheostomy on 8/14. The remainder of her time in the ICU was spent trying to wean her off sedating meds and adjusting anti-psychotic meds. She was taken off versed and fentanyl on 8/17, remained on precedex. On 8/19 she was following commands. At time of d/c she is f/c. Working on Pressure support trials and is scheduled for PEG. She will be transferred to to [facility] where her care will be continued per the following active issues below.      Discharge Plan by active problems   Acute on chronic resp failure due to severe agitation and including cord edema/stridor at reintubation 02/21/14. S/p trach 02/27/14  Plan:  reduce methadone slowly (see neuro section) Initiate weaning protocol per [facility].  Consider pcxr in 48 hr   multiple QTC meds  Plan:  Monitor qtc, slow reduction of methadone   QTC frequent qshift RN   Dysphagia PEG 8/19 Plan:  Resume tube feeds s/p PEG  SUP with pepcid   Anemia of critical illness  Plan:  Sub q hep  Limit phlebotomy when able    Hypoglycemia  known DM on insulin(glucose now 166 8/9)  Plan:  SSI  Cont on lantus, same dose   Acute encephalopathy 2nd to hypoglycemia at admit 02/20/2014  Refractory delirium Plan Cont Precedex, goal to dc this today 8/19 Seroquel 100 mg  BID initiated 8/16, increase 200, consider increase in am 8/20, if not off precedex  Clonazepam 2 mg BID initiated 8/16  Methadone reduced due to apnea - reduce to bid  added haldol scheduled MUST HAVE CLOSE MONITORING OF QTC  Significant Hospital tests/ studies    Intubated 02/20/14  Extubated 02/21/14  Reintubated 8/8 for stridor. Difficult intubation. 6.5 tube.  02/22/14:  Sedated on vent  02/23/14: Overnight agitated and attempting to self extubated. Now deeply sedated with fent and diprivan gtt. Fever curve coming down  PICC 02/23/14 >>>  02/24/14: still very agitated on WUA; propofil changed to precedex  02/25/14: Neuro suspects hypoglycemia related encephalopathy. Want to rule out stroke. Mom indicated full code and SNF placement desire  02/26/14: climbing CK on diprivan to 1425; approx d5 of diprivan. Agitation continues. Neuro want MRI when feasible.  8/14 Trach  8/17- off fent and versed drips, precedex remains  8/19- followed commands!!!! PEG 8/19  Consults neurology, IR  Discharge Exam: BP 118/67  Pulse 71  Temp(Src) 97.7 F (36.5 C) (Core (Comment))  Resp 16  Ht 5\' 2"  (1.575 m)  Wt 55.9 kg (123 lb 3.8 oz)  BMI 22.53 kg/m2  SpO2 100%  LMP 02/05/2014  General: Chronically ill appearing on vent  Neuro: agitated intermittently, rass -1, RN reports following commands  HEENT: Atraumatic, trach wnl  Neck: Trach midline looks clean  Cardiovascular: RRR, no murmur  Lungs: Coarse resolved, CTA  Abdomen: Soft ,no guarding, present bowel sounds  Musculoskeletal: No gross deformities, no edema  Skin: Has multiple scabs and shallow ulcers with mild excoriations on the lower extremities, no frank purulent discharge      Labs at discharge Lab Results  Component Value Date   CREATININE 0.65 03/04/2014   BUN 15 03/04/2014   NA 135* 03/04/2014   K 4.2 03/04/2014   CL 100 03/04/2014   CO2 21 03/04/2014   Lab Results  Component Value Date   WBC 9.0 03/04/2014   HGB 7.4* 03/04/2014   HCT 23.0* 03/04/2014   MCV 61.8* 03/04/2014   PLT 312 03/04/2014   Lab Results  Component Value Date   ALT 19 02/21/2014   AST 37 02/21/2014   ALKPHOS 54 02/21/2014   BILITOT 0.5 02/21/2014   Lab Results  Component Value Date   INR 1.05 03/04/2014   INR 1.10 02/26/2014    Current radiology studies No results found.  Disposition:  01-Home or Self Care      Discharge  Instructions   Increase activity slowly    Complete by:  As directed             Medication List    STOP taking these medications       albuterol 108 (90 BASE) MCG/ACT inhaler  Commonly known as:  PROVENTIL HFA;VENTOLIN HFA  Replaced by:  albuterol (2.5 MG/3ML) 0.083% nebulizer solution      TAKE these medications       acetaminophen 160 MG/5ML solution  Commonly known as:  TYLENOL  Take 20.3 mLs (650 mg total) by mouth every 6 (six) hours as needed for mild pain, moderate pain or fever.     albuterol (2.5 MG/3ML) 0.083% nebulizer solution  Commonly known as:  PROVENTIL  Take 3 mLs (2.5 mg total) by nebulization every 2 (two) hours as needed for wheezing.     antiseptic oral rinse 0.05 % Liqd solution  Commonly known as:  CPC / CETYLPYRIDINIUM CHLORIDE 0.05%  7 mLs by Mouth Rinse route QID.  chlorhexidine 0.12 % solution  Commonly known as:  PERIDEX  15 mLs by Mouth Rinse route 2 (two) times daily.     clonazePAM 2 MG tablet  Commonly known as:  KLONOPIN  Place 1 tablet (2 mg total) into feeding tube 2 (two) times daily.     dexmedetomidine 400 MCG/100ML Soln  Commonly known as:  PRECEDEX  Inject 23.36-70.08 mcg/hr into the vein continuous.     famotidine 40 MG/5ML suspension  Commonly known as:  PEPCID  Place 2.5 mLs (20 mg total) into feeding tube 2 (two) times daily.     feeding supplement (VITAL AF 1.2 CAL) Liqd  Place 1,000 mLs into feeding tube continuous.     fentaNYL 0.05 MG/ML injection  Commonly known as:  SUBLIMAZE  Inject 0.5-2 mLs (25-100 mcg total) into the vein every hour as needed for severe pain (sedation).     glucagon 1 MG injection  Inject 1 mg into the vein once as needed (for emergent hypoglycemia).     haloperidol lactate 5 MG/ML injection  Commonly known as:  HALDOL  Inject 0.5 mLs (2.5 mg total) into the vein every 6 (six) hours as needed.     heparin 5000 UNIT/ML injection  Inject 1 mL (5,000 Units total) into the skin every 8  (eight) hours.     insulin aspart 100 UNIT/ML injection  Commonly known as:  novoLOG  Inject 0-15 Units into the skin every 4 (four) hours.     insulin glargine 100 UNIT/ML injection  Commonly known as:  LANTUS  Inject 0.05 mLs (5 Units total) into the skin at bedtime.     latanoprost 0.005 % ophthalmic solution  Commonly known as:  XALATAN  Place 1 drop into both eyes at bedtime.     magic mouthwash Soln  Take 10 mLs by mouth 3 (three) times daily.     methadone 10 MG tablet  Commonly known as:  DOLOPHINE  Place 1 tablet (10 mg total) into feeding tube every 12 (twelve) hours.     QUEtiapine 200 MG tablet  Commonly known as:  SEROQUEL  Place 1 tablet (200 mg total) into feeding tube 2 (two) times daily.     senna-docusate 8.6-50 MG per tablet  Commonly known as:  Senokot-S  Place 1 tablet into feeding tube daily.     sodium chloride 0.9 % infusion  Inject 250 mLs into the vein as needed (if IV carrier fluid needed.).         Discharged Condition: fair  Physician Statement:   The Patient was personally examined, the discharge assessment and plan has been personally reviewed and I agree with ACNP Jules Vidovich's assessment and plan. > 30 minutes of time have been dedicated to discharge assessment, planning and discharge instructions.   Signed: Sondra Blixt,PETE 03/04/2014, 11:34 AM

## 2014-03-04 NOTE — Progress Notes (Signed)
Subjective: Patient remains intubated and on mechanical ventilation as well as sedation.  Objective: Current vital signs: BP 108/64  Pulse 72  Temp(Src) 97.6 F (36.4 C) (Core (Comment))  Resp 16  Ht 5\' 2"  (1.575 m)  Wt 55.9 kg (123 lb 3.8 oz)  BMI 22.53 kg/m2  SpO2 100%  LMP 02/05/2014  Neurologic Exam: Minimally arousable with partial eye-opening with external stimuli. Patient did not follow any commands. She's currently on Precedex IV, as well as Klonopin and Seroquel. Pupils were equal and reacted normally to light. Extraocular movements were intact but sluggish with oculocephalic maneuvers. No facial weakness was noted. Muscle tone was flaccid throughout with no abnormal posturing and was movement extremities. Deep tendon reflexes 2+ and symmetric. Plantars were mute bilaterally.  Medications: I have reviewed the patient's current medications.  Assessment/Plan: Encephalopathic state secondary to severe hypoglycemia and possible infectious process. Patient remains sedated because of marked agitation, otherwise.   Recommend no changes in current management. We will continue this patient with you.  C.R. Roseanne RenoStewart, MD [facility] [phone]  03/04/2014  10:03 AM

## 2014-03-04 NOTE — Progress Notes (Signed)
Hypoglycemic Event  CBG:59  Treatment: D50 IV 50 mL  Symptoms: None  Follow-up CBG: Time:1315 CBG Result:162  Possible Reasons for Event: Inadequate meal intake and Change in activity  Comments/MD notified: Dr. Tyson AliasFeinstein notified     Cira ServantManus, Gabriellia Rempel E  Remember to initiate Hypoglycemia Order Set & complete

## 2014-03-04 NOTE — Sedation Documentation (Signed)
Pt on precedex gtt at 0.8 mcg/kg/hr

## 2014-03-05 ENCOUNTER — Other Ambulatory Visit (HOSPITAL_COMMUNITY): Payer: Self-pay

## 2014-03-05 DIAGNOSIS — Z931 Gastrostomy status: Secondary | ICD-10-CM

## 2014-03-05 DIAGNOSIS — J9600 Acute respiratory failure, unspecified whether with hypoxia or hypercapnia: Secondary | ICD-10-CM

## 2014-03-05 DIAGNOSIS — Z9911 Dependence on respirator [ventilator] status: Secondary | ICD-10-CM

## 2014-03-05 DIAGNOSIS — Z930 Tracheostomy status: Secondary | ICD-10-CM

## 2014-03-05 DIAGNOSIS — G9340 Encephalopathy, unspecified: Secondary | ICD-10-CM

## 2014-03-05 LAB — COMPREHENSIVE METABOLIC PANEL
ALT: 17 U/L (ref 0–35)
AST: 19 U/L (ref 0–37)
Albumin: 2 g/dL — ABNORMAL LOW (ref 3.5–5.2)
Alkaline Phosphatase: 139 U/L — ABNORMAL HIGH (ref 39–117)
Anion gap: 19 — ABNORMAL HIGH (ref 5–15)
BUN: 14 mg/dL (ref 6–23)
CALCIUM: 8.7 mg/dL (ref 8.4–10.5)
CO2: 16 mEq/L — ABNORMAL LOW (ref 19–32)
Chloride: 102 mEq/L (ref 96–112)
Creatinine, Ser: 0.71 mg/dL (ref 0.50–1.10)
GFR calc Af Amer: >90 mL/min (ref >90)
GFR calc non Af Amer: >90 mL/min (ref >90)
Glucose, Bld: 210 mg/dL — ABNORMAL HIGH (ref 70–99)
Potassium: 4.2 mEq/L (ref 3.7–5.3)
SODIUM: 137 meq/L (ref 137–147)
Total Bilirubin: 0.2 mg/dL — ABNORMAL LOW (ref 0.3–1.2)
Total Protein: 6.7 g/dL (ref 6.0–8.3)

## 2014-03-05 LAB — CBC
HCT: 27.4 % — ABNORMAL LOW (ref 36.0–46.0)
Hemoglobin: 8.7 g/dL — ABNORMAL LOW (ref 12.0–15.0)
MCH: 19.8 pg — AB (ref 26.0–34.0)
MCHC: 31.8 g/dL (ref 30.0–36.0)
MCV: 62.3 fL — ABNORMAL LOW (ref 78.0–100.0)
PLATELETS: 335 10*3/uL (ref 150–400)
RBC: 4.4 MIL/uL (ref 3.87–5.11)
RDW: 16 % — ABNORMAL HIGH (ref 11.5–15.5)
WBC: 10.6 10*3/uL — ABNORMAL HIGH (ref 4.0–10.5)

## 2014-03-05 LAB — BLOOD GAS, ARTERIAL
Acid-base deficit: 3.6 mmol/L — ABNORMAL HIGH (ref 0.0–2.0)
Bicarbonate: 18.5 mEq/L — ABNORMAL LOW (ref 20.0–24.0)
FIO2: 0.3 %
O2 Saturation: 99.4 %
PATIENT TEMPERATURE: 97.5
PEEP: 5 cmH2O
RATE: 16 resp/min
TCO2: 19.2 mmol/L (ref 0–100)
VT: 550 mL
pCO2 arterial: 20.6 mmHg — ABNORMAL LOW (ref 35.0–45.0)
pH, Arterial: 7.559 — ABNORMAL HIGH (ref 7.350–7.450)
pO2, Arterial: 143 mmHg — ABNORMAL HIGH (ref 80.0–100.0)

## 2014-03-05 LAB — BASIC METABOLIC PANEL
Anion gap: 15 (ref 5–15)
BUN: 12 mg/dL (ref 6–23)
CO2: 18 mEq/L — ABNORMAL LOW (ref 19–32)
CREATININE: 0.76 mg/dL (ref 0.50–1.10)
Calcium: 8.6 mg/dL (ref 8.4–10.5)
Chloride: 102 mEq/L (ref 96–112)
GFR calc Af Amer: >90 mL/min (ref >90)
GFR calc non Af Amer: >90 mL/min (ref >90)
GLUCOSE: 247 mg/dL — AB (ref 70–99)
Potassium: 3.9 mEq/L (ref 3.7–5.3)
Sodium: 135 mEq/L — ABNORMAL LOW (ref 137–147)

## 2014-03-05 LAB — TSH: TSH: 7.17 u[IU]/mL — AB (ref 0.350–4.500)

## 2014-03-05 LAB — PREALBUMIN: Prealbumin: 10 mg/dL — ABNORMAL LOW (ref 17.0–34.0)

## 2014-03-05 NOTE — Consult Note (Signed)
Name: [name] MRN: [id number] DOB: [birth year]    ADMISSION DATE:  03/04/2014 CONSULTATION DATE: 8/20  REFERRING MD :  [facility] PRIMARY SERVICE:  [facility]  CHIEF COMPLAINT:  AMS/Vent dependent    SIGNIFICANT EVENTS / STUDIES:  8/15 CT head: No acute intracranial abnormality. Mild sinusitis  8/15 EEG c/w mild encephalopathy, no seizures  8/16 ct head>>>neg acute, sinusitis Intubated 02/20/14  Extubated 02/21/14  Reintubated 8/8 for stridor. Difficult intubation. 6.5 tube.  02/22/14: Sedated on vent  02/23/14: Overnight agitated and attempting to self extubated. Now deeply sedated with fent and diprivan gtt. Fever curve coming down  02/24/14: still very agitated on WUA; propofil changed to precedex  02/25/14: Neuro suspects hypoglycemia related encephalopathy. Want to rule out stroke. Mom indicated full code and SNF placement desire  02/26/14: climbing CK on diprivan to 1425; approx d5 of diprivan. Agitation continues. Neuro want MRI when feasible.  8/14 Trach  8/17- off fent and versed drips, precedex remains  8/19- followed commands!!!! 8/19 transferred to [place] / TUBES:   CULTURES:   ANTIBIOTICS:   HISTORY OF PRESENT ILLNESS:   32 yof with known DM on insulin and possible seizure disorder. On 8/7 became acutely confused and had altered mental status, her husband called EMS who found that her blood sugar was about 22. She was given glucagon and subsequent fingerstick was around 80-90. Apparently also she had some pieces of chicken in her mouth, unclear whether she had any aspiration episode. In the ED she was awake but very belligerent and confused, despite multiple ativan and high dose haldol, due to extreme interference with care, she was intubated. She did have a fever 101.9 rectally. CXR was clear, CT head also unremarkable other than mild sinusitis. There was fair amount of clear secretions out of her ET tube. Of note, she was also in the ED about 4 days prior due to similar  reason, where she was also found to have very low blood sugar, which was treated and patient improved.  She was admitted to the intensive care. Therapeutic interventions included: mechanical ventilation, we obtained sputum cultures and started empiric unasyn for possible aspiration, and started nutritional support to stabilize hypoglycemia. She was extubated on 8/8, but required re-intubation for stridor. This was noted to be a difficult airway and we were only able to pass a 6.5 tube at that point. She remained agitated. Required heavy sedation as she kept attempting to self extubate. On 8/11 her fever curve had improved. We consulted neurology as she had persistent delirium in spite of treatment and supportive care. They felt that the ongoing agitation and delirium was due to hypoglycemia related encephalopathy. She had been maintained of propofol for her agitation but this was d/cd on 8/13 due to climbing tCKs. Given her persistent delirium we went ahead with tracheostomy on 8/14. The remainder of her time in the ICU was spent trying to wean her off sedating meds and adjusting anti-psychotic meds. She was taken off versed and fentanyl on 8/17, remained on precedex. On 8/19 she was following commands. At time of d/c she is f/c. Working on Pressure support trials and is scheduled for PEG. She was transferred to to [facility] on 8/19  where her care will be continued.   PAST MEDICAL HISTORY :  Past Medical History  Diagnosis Date  . Diabetes mellitus without complication    Past Surgical History  Procedure Laterality Date  . Tracheostomy    . Appendectomy    . Tracheostomy  feinstein   Prior to Admission medications   Medication Sig Start Date End Date Taking? Authorizing Provider  acetaminophen (TYLENOL) 160 MG/5ML solution Take 20.3 mLs (650 mg total) by mouth every 6 (six) hours as needed for mild pain, moderate pain or fever. 03/04/14   Erick Colace, NP  albuterol (PROVENTIL) (2.5 MG/3ML)  0.083% nebulizer solution Take 3 mLs (2.5 mg total) by nebulization every 2 (two) hours as needed for wheezing. 03/04/14   Erick Colace, NP  Alum & Mag Hydroxide-Simeth (MAGIC MOUTHWASH) SOLN Take 10 mLs by mouth 3 (three) times daily. 03/04/14   Erick Colace, NP  antiseptic oral rinse (CPC / CETYLPYRIDINIUM CHLORIDE 0.05%) 0.05 % LIQD solution 7 mLs by Mouth Rinse route QID. 03/04/14   Erick Colace, NP  chlorhexidine (PERIDEX) 0.12 % solution 15 mLs by Mouth Rinse route 2 (two) times daily. 03/04/14   Erick Colace, NP  clonazePAM (KLONOPIN) 2 MG tablet Place 1 tablet (2 mg total) into feeding tube 2 (two) times daily. 03/04/14   Erick Colace, NP  dexmedetomidine (PRECEDEX) 400 MCG/100ML SOLN Inject 23.36-70.08 mcg/hr into the vein continuous. 03/04/14   Erick Colace, NP  famotidine (PEPCID) 40 MG/5ML suspension Place 2.5 mLs (20 mg total) into feeding tube 2 (two) times daily. 03/04/14   Erick Colace, NP  fentaNYL (SUBLIMAZE) 0.05 MG/ML injection Inject 0.5-2 mLs (25-100 mcg total) into the vein every hour as needed for severe pain (sedation). 03/04/14   Erick Colace, NP  glucagon 1 MG injection Inject 1 mg into the vein once as needed (for emergent hypoglycemia).    Historical Provider, MD  haloperidol lactate (HALDOL) 5 MG/ML injection Inject 0.5 mLs (2.5 mg total) into the vein every 6 (six) hours as needed. 03/04/14   Erick Colace, NP  heparin 5000 UNIT/ML injection Inject 1 mL (5,000 Units total) into the skin every 8 (eight) hours. 03/04/14   Erick Colace, NP  insulin aspart (NOVOLOG) 100 UNIT/ML injection Inject 0-15 Units into the skin every 4 (four) hours. 03/04/14   Erick Colace, NP  insulin glargine (LANTUS) 100 UNIT/ML injection Inject 0.05 mLs (5 Units total) into the skin at bedtime. 03/04/14   Erick Colace, NP  latanoprost (XALATAN) 0.005 % ophthalmic solution Place 1 drop into both eyes at bedtime.    Historical Provider, MD  methadone (DOLOPHINE) 10 MG tablet  Place 1 tablet (10 mg total) into feeding tube every 12 (twelve) hours. 03/04/14   Erick Colace, NP  Nutritional Supplements (FEEDING SUPPLEMENT, VITAL AF 1.2 CAL,) LIQD Place 1,000 mLs into feeding tube continuous. 03/04/14   Erick Colace, NP  QUEtiapine (SEROQUEL) 200 MG tablet Place 1 tablet (200 mg total) into feeding tube 2 (two) times daily. 03/04/14   Erick Colace, NP  senna-docusate (SENOKOT-S) 8.6-50 MG per tablet Place 1 tablet into feeding tube daily. 03/04/14   Erick Colace, NP  sodium chloride 0.9 % infusion Inject 250 mLs into the vein as needed (if IV carrier fluid needed.). 03/04/14   Erick Colace, NP   No Known Allergies  FAMILY HISTORY:  No family history on file. SOCIAL HISTORY:  reports that she has quit smoking. She does not have any smokeless tobacco history on file. She reports that she does not drink alcohol or use illicit drugs.  REVIEW OF SYSTEMS:  NA  SUBJECTIVE:  Sedated on full vent support VITAL SIGNS: Temp:  [97.7 F (36.5 C)-97.9 F (36.6  C)] 97.8 F (36.6 C) (08/19 1300) Pulse Rate:  [69-78] 78 (08/19 1400) Resp:  [15-20] 20 (08/19 1400) BP: (118-157)/(60-84) 153/81 mmHg (08/19 1400) SpO2:  [100 %] 100 % (08/19 1400) FiO2 (%):  [30 %] 30 % (08/19 1300)  PHYSICAL EXAMINATION: General: Chronically ill appearing on vent  Neuro: agitated intermittently, rass -1, on precedex drip  HEENT: Atraumatic, trach wnl  Neck: Trach midline looks clean  Cardiovascular: RRR, no murmur  Lungs: Coarse resolved, CTA  Abdomen: Soft ,no guarding, present bowel sounds, dressing intact  Musculoskeletal: No gross deformities, no edema  Skin: Has multiple scabs and shallow ulcers with mild excoriations on the lower extremities, no frank purulent discharge    Recent Labs Lab 03/03/14 0420 03/04/14 0415 03/05/14 0545  NA 140 135* 137  K 3.8 4.2 4.2  CL 106 100 102  CO2 21 21 16*  BUN '13 15 14  ' CREATININE 0.59 0.65 0.71  GLUCOSE 208* 289* 210*     Recent Labs Lab 02/28/14 0351 03/04/14 0415 03/05/14 0545  HGB 7.6* 7.4* 8.7*  HCT 23.3* 23.0* 27.4*  WBC 9.4 9.0 10.6*  PLT 198 312 335   Ir Gastrostomy Tube Mod Sed  03/04/2014   CLINICAL DATA:  Encephalopathy and need for gastrostomy tube for nutrition.  EXAM: PERCUTANEOUS GASTROSTOMY TUBE PLACEMENT  ANESTHESIA/SEDATION: No conscious sedation was administered.  CONTRAST:  85m OMNIPAQUE IOHEXOL 300 MG/ML  SOLN  MEDICATIONS: 2 g IV Ancef. As antibiotic prophylaxis, Ancef was ordered pre-procedure and administered intravenously within one hour of incision.  FLUOROSCOPY TIME:  2 minutes and 42 seconds.  PROCEDURE: The procedure, risks, benefits, and alternatives were explained to the patient's mother. Questions regarding the procedure were encouraged and answered. The patient's mother understands and consents to the procedure. A time-out procedure was performed prior to the procedure.  A 5-French catheter was then advanced through the the patient's mouth under fluoroscopy into the esophagus and to the level of the stomach. This catheter was used to insufflate the stomach with air under fluoroscopy.  The abdominal wall was prepped with Betadine in a sterile fashion, and a sterile drape was applied covering the operative field. A sterile gown and sterile gloves were used for the procedure. Local anesthesia was provided with 1% Lidocaine.  A skin incision was made in the upper abdominal wall. Under fluoroscopy, an 18 gauge trocar needle was advanced into the stomach. Contrast injection was performed to confirm intraluminal position of the needle tip. A single T tack was then deployed in the lumen of the stomach. This was brought up to tension at the skin surface.  Over a guidewire, a 9-French sheath was advanced into the lumen of the stomach. The wire was left in place as a safety wire. A loop snare device from a percutaneous gastrostomy kit was then advanced into the stomach.  A floppy guide wire was  advanced through the orogastric catheter under fluoroscopy in the stomach. The loop snare advanced through the percutaneous gastric access was used to snare the guide wire. This allowed withdrawal of the loop snare out of the patient's mouth by retraction of the orogastric catheter and wire.  A 20-French bumper retention gastrostomy tube was looped around the snare device. It was then pulled back through the patient's mouth. The retention bumper was brought up to the anterior gastric wall. The T tack suture was cut at the skin. The exiting gastrostomy tube was cut to appropriate length and a feeding adapter applied. The catheter was injected  with contrast material to confirm position and a fluoroscopic spot image saved. The tube was then flushed with saline. A dressing was applied over the gastrostomy exit site.  COMPLICATIONS: None.  FINDINGS: The stomach distended well with air allowing safe placement of the gastrostomy tube. After placement, the tip of the gastrostomy tube lies in the body of the stomach.  IMPRESSION: Percutaneous gastrostomy with placement of a 20-French bumper retention tube in the body of the stomach. This tube can be used for percutaneous feeds beginning in 24 hours after placement.   Electronically Signed   By: Aletta Edouard M.D.   On: 03/04/2014 15:47   Dg Chest Port 1 View  03/05/2014   CLINICAL DATA:  Wrist carried distress.  Tracheostomy.  EXAM: PORTABLE CHEST - 1 VIEW  COMPARISON:  03/02/2014.  FINDINGS: Support apparatus: Tracheostomy, feeding tube and RIGHT upper extremity PICC appear unchanged. Monitoring leads project over the chest.  Cardiomediastinal Silhouette:  Unchanged.  Lungs: Increasing LEFT basilar opacity, most compatible with atelectasis No pneumothorax.  Effusions:  None.  Other:  None.  IMPRESSION: Stable support apparatus. Suboptimal inspiration with LEFT-greater-than-RIGHT basilar atelectasis.   Electronically Signed   By: Dereck Ligas M.D.   On: 03/05/2014  08:00    ASSESSMENT    Ventilator dependence with trach    Acute respiratory failure with hypoxia    Encephalopathy acute presumed secondary to hypoglycemia   Altered mental status   Hypoglycemia   G tube feedings    Prolonged QTC     PLAN: Vent bundle and wean per protocol. Sedation is an issue. Wean precedex to off. AMS will be the challenge in weaning her.  Continue current medications for AMS  Follow CBG for hypoglycemia  Tube feeds via new peg  Follow QTC  Richardson Landry Minor ACNP Maryanna Shape PCCM Pager [phone] till 3 pm If no answer page [phone]   PCCM ATTENDING: I have interviewed and examined the patient and reviewed the database. I have formulated the assessment and plan as reflected in the note above with amendments made by me.   Merton Border, MD;  PCCM service; Mobile [phone]  03/05/2014, 9:33 AM

## 2014-03-05 NOTE — Progress Notes (Addendum)
[facility]                                                                                              Progress note     Patient Demographics  [name], is a 48 y.o. female  VWU:[id number]  GNF:[id number]  DOB - [birth year]  Admit date - 03/04/2014  Admitting Physician Carron Curie, MD  Outpatient Primary MD for the patient is No PCP Per Patient  LOS - 1   No chief complaint on file.          Subjective:   [name] is obtunded and cannot give any history  Objective:   Vital signs  Temperature 97.7 Heart rate 79 Respiratory rate 16 Blood pressure 109/65 Pulse ox 100%    Exam Obtunded,  [place].AT,PERRAL Supple Neck,No JVD, No cervical lymphadenopathy appriciated. Tracheostomy in place Symmetrical Chest wall movement, Good air movement bilaterally,  RRR,No Gallops,Rubs or new Murmurs, No Parasternal Heave +ve B.Sounds, Abd Soft, Non tender, No organomegaly appreciated, No rebound - guarding or rigidity. No Cyanosis, Clubbing or edema, No new Rash or bruise     I&Os 1568/1875  Data Review   CBC  Recent Labs Lab 02/27/14 0405 02/28/14 0351 03/04/14 0415 03/05/14 0545  WBC 7.9 9.4 9.0 10.6*  HGB 7.8* 7.6* 7.4* 8.7*  HCT 24.7* 23.3* 23.0* 27.4*  PLT 199 198 312 335  MCV 65.0* 62.0* 61.8* 62.3*  MCH 20.5* 20.2* 19.9* 19.8*  MCHC 31.6 32.6 32.2 31.8  RDW 16.3* 15.8* 15.7* 16.0*  LYMPHSABS 1.4  --  1.4  --   MONOABS 0.8  --  0.8  --   EOSABS 0.2  --  0.2  --   BASOSABS 0.0  --  0.0  --     Chemistries   Recent Labs Lab 02/27/14 0405 02/28/14 0350 02/28/14 0351 03/03/14 0420 03/04/14 0415 03/05/14 0545  NA 138  --  138 140 135* 137  K 4.6  --  4.2 3.8 4.2 4.2  CL 110  --  106 106 100 102  CO2 20  --  22 21 21  16*  GLUCOSE 239*  --  195* 208* 289* 210*  BUN 10  --  7 13 15 14   CREATININE 0.61  --  0.68 0.59 0.65 0.71  CALCIUM 7.4*  --  8.1* 8.3* 8.5  8.7  MG 1.8 1.6  --   --   --   --   AST  --   --   --   --   --  19  ALT  --   --   --   --   --  17  ALKPHOS  --   --   --   --   --  139*  BILITOT  --   --   --   --   --  0.2*   ------------------------------------------------------------------------------------------------------------------ CrCl is unknown because both a height and weight (above a minimum accepted value) are required for this calculation. ------------------------------------------------------------------------------------------------------------------ No results found for this basename: HGBA1C,  in the last 72 hours ------------------------------------------------------------------------------------------------------------------ No results found for this basename:  CHOL, HDL, LDLCALC, TRIG, CHOLHDL, LDLDIRECT,  in the last 72 hours ------------------------------------------------------------------------------------------------------------------  Recent Labs  03/05/14 0545  TSH 7.170*   ------------------------------------------------------------------------------------------------------------------ No results found for this basename: VITAMINB12, FOLATE, FERRITIN, TIBC, IRON, RETICCTPCT,  in the last 72 hours  Coagulation profile  Recent Labs Lab 02/26/14 1800 03/04/14 0415  INR 1.10 1.05    No results found for this basename: DDIMER,  in the last 72 hours  Cardiac Enzymes  Recent Labs Lab 02/28/14 0351 03/02/14 0444  CKMB 1.6 2.6   ------------------------------------------------------------------------------------------------------------------ No components found with this basename: POCBNP,   Micro Results No results found for this or any previous visit (from the past 240 hour(s)).     Assessment & Plan   Respiratory failure, ventilator dependent status post tracheostomy on 02/27/2014 wean per protocol Encephalopathy/delirium due to hypoglycemia we'll continue with psychotropic  medications Prolonged QTC. Monitor Diabetes mellitus only levemir and insulin sliding scale Protein calorie malnutrition on tube feedings per PEG tube Anemia of chronic disease Will monitor hemoglobin Generalized weakness complicated by encephalopathy PT/OT to evaluate Acidosis with high anion gap  Plan Check ABG stat BMP at 2 PM  Code Status: Full DVT Prophylaxis  heparin   Carron CurieHijazi, Weiland Tomich M.D on 03/05/2014 at 11:29 AM

## 2014-03-06 NOTE — Discharge Summary (Signed)
Agree with abovemontor QTC reuce methadone slowly Mcarthur Rossettianiel J. Tyson AliasFeinstein, MD, FACP Pgr: [phone] [facility]

## 2014-03-06 NOTE — Progress Notes (Signed)
[facility]                                                                                              Progress note     Patient Demographics  [name], is a 48 y.o. female  WUJ:[id number]  [id number]  DOB - [birth year]  Admit date - 03/04/2014  Admitting Physician Carron Curie, MD  Outpatient Primary MD for the patient is No PCP Per Patient  LOS - 2   No chief complaint on file.          Subjective:   [name] is obtunded and cannot give any history  Objective:   Vital signs  Temperature 96.0 Heart rate 76 Respiratory rate 18 Blood pressure 118/70 Pulse ox 100%    Exam Obtunded,  [place].AT,PERRAL Supple Neck,No JVD, No cervical lymphadenopathy appriciated. Tracheostomy in place Symmetrical Chest wall movement, Good air movement bilaterally,  RRR,No Gallops,Rubs or new Murmurs, No Parasternal Heave +ve B.Sounds, Abd Soft, Non tender, No organomegaly appreciated, No rebound - guarding or rigidity. No Cyanosis, Clubbing or edema, No new Rash or bruise     I&Os 1854/1875  Data Review   CBC  Recent Labs Lab 02/28/14 0351 03/04/14 0415 03/05/14 0545  WBC 9.4 9.0 10.6*  HGB 7.6* 7.4* 8.7*  HCT 23.3* 23.0* 27.4*  PLT 198 312 335  MCV 62.0* 61.8* 62.3*  MCH 20.2* 19.9* 19.8*  MCHC 32.6 32.2 31.8  RDW 15.8* 15.7* 16.0*  LYMPHSABS  --  1.4  --   MONOABS  --  0.8  --   EOSABS  --  0.2  --   BASOSABS  --  0.0  --     Chemistries   Recent Labs Lab 02/28/14 0350 02/28/14 0351 03/03/14 0420 03/04/14 0415 03/05/14 0545 03/05/14 1300  NA  --  138 140 135* 137 135*  K  --  4.2 3.8 4.2 4.2 3.9  CL  --  106 106 100 102 102  CO2  --  22 21 21  16* 18*  GLUCOSE  --  195* 208* 289* 210* 247*  BUN  --  7 13 15 14 12   CREATININE  --  0.68 0.59 0.65 0.71 0.76  CALCIUM  --  8.1* 8.3* 8.5 8.7 8.6  MG 1.6  --   --   --   --   --   AST  --   --   --   --  19  --    ALT  --   --   --   --  17  --   ALKPHOS  --   --   --   --  139*  --   BILITOT  --   --   --   --  0.2*  --    ------------------------------------------------------------------------------------------------------------------ CrCl is unknown because both a height and weight (above a minimum accepted value) are required for this calculation. ------------------------------------------------------------------------------------------------------------------ No results found for this basename: HGBA1C,  in the last 72 hours ------------------------------------------------------------------------------------------------------------------ No results found for this basename: CHOL, HDL, LDLCALC, TRIG, CHOLHDL, LDLDIRECT,  in the last 72 hours ------------------------------------------------------------------------------------------------------------------  Recent Labs  03/05/14 0545  TSH 7.170*   ------------------------------------------------------------------------------------------------------------------ No results found for this basename: VITAMINB12, FOLATE, FERRITIN, TIBC, IRON, RETICCTPCT,  in the last 72 hours  Coagulation profile  Recent Labs Lab 03/04/14 0415  INR 1.05    No results found for this basename: DDIMER,  in the last 72 hours  Cardiac Enzymes  Recent Labs Lab 02/28/14 0351 03/02/14 0444  CKMB 1.6 2.6   ------------------------------------------------------------------------------------------------------------------ No components found with this basename: POCBNP,   Micro Results No results found for this or any previous visit (from the past 240 hour(s)).     Assessment & Plan   Respiratory failure, ventilator dependent status post tracheostomy on 02/27/2014 wean per protocol Encephalopathy/delirium due to hypoglycemia we'll continue with psychotropic medications Prolonged QTC. Monitor Diabetes mellitus only levemir and insulin sliding scale Protein  calorie malnutrition on tube feedings per PEG tube Anemia of chronic disease Will monitor hemoglobin Generalized weakness complicated by encephalopathy PT/OT to evaluate Acidosis with high anion gap  Plan Start fish oil , Provigil and melatonin Check labs in a.m. Code Status: Full DVT Prophylaxis  heparin   Carron CurieHijazi, Jamel Holzmann M.D on 03/06/2014 at 12:06 PM

## 2014-03-07 LAB — BASIC METABOLIC PANEL
Anion gap: 17 — ABNORMAL HIGH (ref 5–15)
BUN: 13 mg/dL (ref 6–23)
CO2: 17 meq/L — AB (ref 19–32)
CREATININE: 0.69 mg/dL (ref 0.50–1.10)
Calcium: 9 mg/dL (ref 8.4–10.5)
Chloride: 99 mEq/L (ref 96–112)
GFR calc Af Amer: >90 mL/min (ref >90)
GFR calc non Af Amer: >90 mL/min (ref >90)
GLUCOSE: 263 mg/dL — AB (ref 70–99)
Potassium: 5.3 mEq/L (ref 3.7–5.3)
SODIUM: 133 meq/L — AB (ref 137–147)

## 2014-03-07 LAB — PREALBUMIN: Prealbumin: 11.7 mg/dL — ABNORMAL LOW (ref 17.0–34.0)

## 2014-03-07 NOTE — Progress Notes (Addendum)
[facility]                                                                                              Progress note     Patient Demographics  [name], is a 48 y.o. female  ZOX:[id number]  WJX:[id number]  DOB - 02-18-1966  Admit date - 03/04/2014  Admitting Physician Carron Curie, MD  Outpatient Primary MD for the patient is No PCP Per Patient  LOS - 3   No chief complaint on file.          Subjective:   [name] is obtunded and cannot give any history  Objective:   Vital signs  Temperature 97 Heart rate 76 Respiratory rate 16 Blood pressure 111/70 Pulse ox 100%    Exam Obtunded,  [place].AT,PERRAL Supple Neck,No JVD, No cervical lymphadenopathy appriciated. Tracheostomy in place Symmetrical Chest wall movement, Good air movement bilaterally,  RRR,No Gallops,Rubs or new Murmurs, No Parasternal Heave +ve B.Sounds, Abd Soft, Non tender, No organomegaly appreciated, No rebound - guarding or rigidity. PEG tube in place No Cyanosis, Clubbing or edema, No new Rash or bruise     I&Os -491  Data Review   CBC  Recent Labs Lab 03/04/14 0415 03/05/14 0545  WBC 9.0 10.6*  HGB 7.4* 8.7*  HCT 23.0* 27.4*  PLT 312 335  MCV 61.8* 62.3*  MCH 19.9* 19.8*  MCHC 32.2 31.8  RDW 15.7* 16.0*  LYMPHSABS 1.4  --   MONOABS 0.8  --   EOSABS 0.2  --   BASOSABS 0.0  --     Chemistries   Recent Labs Lab 03/03/14 0420 03/04/14 0415 03/05/14 0545 03/05/14 1300 03/07/14 0720  NA 140 135* 137 135* 133*  K 3.8 4.2 4.2 3.9 5.3  CL 106 100 102 102 99  CO2 21 21 16* 18* 17*  GLUCOSE 208* 289* 210* 247* 263*  BUN CREATININE 0.59 0.65 0.71 0.76 0.69  CALCIUM 8.3* 8.5 8.7 8.6 9.0  AST  --   --  19  --   --   ALT  --   --  17  --   --   ALKPHOS  --   --  139*  --   --   BILITOT  --   --  0.2*  --   --     ------------------------------------------------------------------------------------------------------------------ CrCl is unknown because both a height and weight (above a minimum accepted value) are required for this calculation. ------------------------------------------------------------------------------------------------------------------ No results found for this basename: HGBA1C,  in the last 72 hours ------------------------------------------------------------------------------------------------------------------ No results found for this basename: CHOL, HDL, LDLCALC, TRIG, CHOLHDL, LDLDIRECT,  in the last 72 hours ------------------------------------------------------------------------------------------------------------------  Recent Labs  03/05/14 0545  TSH 7.170*   ------------------------------------------------------------------------------------------------------------------ No results found for this basename: VITAMINB12, FOLATE, FERRITIN, TIBC, IRON, RETICCTPCT,  in the last 72 hours  Coagulation profile  Recent Labs Lab 03/04/14 0415  INR 1.05    No results found for this basename: DDIMER,  in the last 72 hours  Cardiac Enzymes  Recent Labs Lab 03/02/14 0444  CKMB 2.6   ------------------------------------------------------------------------------------------------------------------  No components found with this basename: POCBNP,   Micro Results No results found for this or any previous visit (from the past 240 hour(s)).     Assessment & Plan   Respiratory failure, ventilator dependent status post tracheostomy on 02/27/2014 no success with weaning Encephalopathy/delirium due to hypoglycemia we'll continue with psychotropic medications, fish oil Provigil and melatonin  Prolonged QTC. Monitor Diabetes mellitus only levemir and insulin sliding scale Protein calorie malnutrition on tube feedings per PEG tube Anemia of chronic disease Will monitor  hemoglobin Generalized weakness complicated by encephalopathy PT/OT to evaluate Acidosis with high anion gap resolved  Plan Increase Levemir to 8 units subcutaneous each bedtime Check EKG in a.m. Taper Precedex till off DVT Prophylaxis  heparin   Carron Curie M.D on 03/07/2014 at 12:07 PM

## 2014-03-08 ENCOUNTER — Other Ambulatory Visit (HOSPITAL_COMMUNITY): Payer: Self-pay

## 2014-03-08 NOTE — Progress Notes (Signed)
[facility]                                                                                              Progress note     Patient Demographics  [name], is a 48 y.o. female  WGN:[id number]  HQI:[id number]  DOB - 07-06-1966  Admit date - 03/04/2014  Admitting Physician Carron Curie, MD  Outpatient Primary MD for the patient is No PCP Per Patient  LOS - 4   No chief complaint on file.          Subjective:   [name] is obtunded and cannot give any history  Objective:   Vital signs  Temperature 97 Heart rate 76 Respiratory rate 16 Blood pressure 111/70 Pulse ox 100%    Exam Obtunded,  [place].AT,PERRAL Supple Neck,No JVD, No cervical lymphadenopathy appriciated. Tracheostomy in place Symmetrical Chest wall movement, Good air movement bilaterally,  RRR,No Gallops,Rubs or new Murmurs, No Parasternal Heave +ve B.Sounds, Abd Soft, Non tender, No organomegaly appreciated, No rebound - guarding or rigidity. PEG tube in place No Cyanosis, Clubbing or edema, No new Rash or bruise     I&Os -491  Data Review   CBC  Recent Labs Lab 03/04/14 0415 03/05/14 0545  WBC 9.0 10.6*  HGB 7.4* 8.7*  HCT 23.0* 27.4*  PLT 312 335  MCV 61.8* 62.3*  MCH 19.9* 19.8*  MCHC 32.2 31.8  RDW 15.7* 16.0*  LYMPHSABS 1.4  --   MONOABS 0.8  --   EOSABS 0.2  --   BASOSABS 0.0  --     Chemistries   Recent Labs Lab 03/03/14 0420 03/04/14 0415 03/05/14 0545 03/05/14 1300 03/07/14 0720  NA 140 135* 137 135* 133*  K 3.8 4.2 4.2 3.9 5.3  CL 106 100 102 102 99  CO2 21 21 16* 18* 17*  GLUCOSE 208* 289* 210* 247* 263*  BUN CREATININE 0.59 0.65 0.71 0.76 0.69  CALCIUM 8.3* 8.5 8.7 8.6 9.0  AST  --   --  19  --   --   ALT  --   --  17  --   --   ALKPHOS  --   --  139*  --   --   BILITOT  --   --  0.2*  --   --     ------------------------------------------------------------------------------------------------------------------ CrCl is unknown because both a height and weight (above a minimum accepted value) are required for this calculation. ------------------------------------------------------------------------------------------------------------------ No results found for this basename: HGBA1C,  in the last 72 hours ------------------------------------------------------------------------------------------------------------------ No results found for this basename: CHOL, HDL, LDLCALC, TRIG, CHOLHDL, LDLDIRECT,  in the last 72 hours ------------------------------------------------------------------------------------------------------------------ No results found for this basename: TSH, T4TOTAL, FREET3, T3FREE, THYROIDAB,  in the last 72 hours ------------------------------------------------------------------------------------------------------------------ No results found for this basename: VITAMINB12, FOLATE, FERRITIN, TIBC, IRON, RETICCTPCT,  in the last 72 hours  Coagulation profile  Recent Labs Lab 03/04/14 0415  INR 1.05    No results found for this basename: DDIMER,  in the last 72 hours  Cardiac Enzymes  Recent Labs Lab 03/02/14 0444  CKMB 2.6   ------------------------------------------------------------------------------------------------------------------ No components found with this basename: POCBNP,   Micro Results No results found for this or any previous visit (from the past 240 hour(s)).     Assessment & Plan   Respiratory failure, ventilator dependent status post tracheostomy on 02/27/2014 no success with weaning Encephalopathy/delirium due to hypoglycemia we'll continue with psychotropic medications, fish oil Provigil and melatonin  Prolonged QTC. Monitor Diabetes mellitus only levemir and insulin sliding scale Protein calorie malnutrition on tube feedings per  PEG tube Anemia of chronic disease Will monitor hemoglobin Generalized weakness complicated by encephalopathy PT/OT to evaluate Acidosis   Plan Increase Levemir to 12units subcutaneous each bedtime Increase Seroquel to 200 mg per tube twice a day, Taper Precedex till of, slowly  Normal saline  DVT Prophylaxis  heparin   Carron Curie M.D on 03/08/2014 at 12:25 PM

## 2014-03-09 ENCOUNTER — Telehealth: Payer: Self-pay | Admitting: Dietician

## 2014-03-09 DIAGNOSIS — Z930 Tracheostomy status: Secondary | ICD-10-CM

## 2014-03-09 DIAGNOSIS — J9620 Acute and chronic respiratory failure, unspecified whether with hypoxia or hypercapnia: Secondary | ICD-10-CM

## 2014-03-09 NOTE — Progress Notes (Addendum)
Name: Lisett Dirusso MRN: [id number] DOB: 1965/11/23    ADMISSION DATE:  03/04/2014 CONSULTATION DATE: 8/20  REFERRING MD :  [facility] PRIMARY SERVICE:  [facility]  CHIEF COMPLAINT:  AMS/Vent dependent  SIGNIFICANT EVENTS / STUDIES:  8/15 CT head: No acute intracranial abnormality. Mild sinusitis  8/15 EEG c/w mild encephalopathy, no seizures  8/16 ct head>>>neg acute, sinusitis Intubated 02/20/14  Extubated 02/21/14  Reintubated 8/8 for stridor. Difficult intubation. 6.5 tube.  02/22/14: Sedated on vent  02/23/14: Overnight agitated and attempting to self extubated. Now deeply sedated with fent and diprivan gtt. Fever curve coming down  02/24/14: still very agitated on WUA; propofil changed to precedex  02/25/14: Neuro suspects hypoglycemia related encephalopathy. Want to rule out stroke. Mom indicated full code and SNF placement desire  02/26/14: climbing CK on diprivan to 1425; approx d5 of diprivan. Agitation continues. Neuro want MRI when feasible.  8/14 Trach  8/17- off fent and versed drips, precedex remains  8/19- followed commands!!!! 8/19 transferred to [facility]  LINES / TUBES: PEG 8/19 >>> Trach 8/14 >>>  CULTURES: None  ANTIBIOTICS: None   SUBJECTIVE:  Sedated on precedex, remains on full vent support.   VITAL SIGNS:  Reviewed, VSS.    PHYSICAL EXAMINATION: General: Chronically ill appearing on vent  Neuro: agitated intermittently, rass -1, on precedex drip  HEENT: [place]/AT, trach in place, C/D/I. Cardiovascular: RRR, no murmur  Lungs: CTA bilaterally, no W/R/R. Abdomen: Soft ,no guarding, present bowel sounds, PEG in place, C/D/I. Musculoskeletal: No gross deformities, no edema  Skin: warm, dry.   Recent Labs Lab 03/05/14 0545 03/05/14 1300 03/07/14 0720  NA 137 135* 133*  K 4.2 3.9 5.3  CL 102 102 99  CO2 16* 18* 17*  BUN CREATININE 0.71 0.76 0.69  GLUCOSE 210* 247* 263*    Recent Labs Lab 03/04/14 0415 03/05/14 0545  HGB 7.4* 8.7*  HCT 23.0*  27.4*  WBC 9.0 10.6*  PLT 312 335   Dg Chest Port 1 View  03/08/2014   CLINICAL DATA:  Respiratory failure, on ventilator and  EXAM: PORTABLE CHEST - 1 VIEW  COMPARISON:  Portable chest x-ray of March 05, 2014  FINDINGS: The lungs remain mildly hypoinflated. There is no infiltrate or pleural effusion. There is minimal left basilar atelectasis posteriorly medially. The endotracheal tube tip lies 3.1 cm above the crotch of the carina. The cardiac silhouette and pulmonary vascularity are normal. There is no pleural effusion.  There is mild gaseous distention of the stomach.  IMPRESSION: Bilateral hypoinflation with no acute cardiopulmonary abnormality.   Electronically Signed   By: David  [place]   On: 03/08/2014 08:39    ASSESSMENT  Ventilator dependence with trach Acute respiratory failure with hypoxia Encephalopathy acute presumed secondary to hypoglycemia Altered mental status Hypoglycemia G tube feedings Prolonged QTC   PLAN: - Vent bundle and wean per protocol. Sedation is an issue. Wean precedex to off. AMS will be the challenge in weaning her. - Continue current medications for AMS - Follow CBG for hypoglycemia - Tube feeds via new peg - Follow QTC   Rahul Celine Mans, PA - [facility] Pgr: [phone]  or [phone]  Continues to have apneic episodes on TC, continue weaning for now and avoid sedating medications as able.  Patient seen and examined, agree with above note.  I dictated the care and orders written for this patient under my direction.  Alyson Reedy,  MD [phone]

## 2014-03-09 NOTE — Telephone Encounter (Signed)
Calling patient to provide support. Mailbox was full. Unable to leave a message.

## 2014-03-10 ENCOUNTER — Other Ambulatory Visit (HOSPITAL_COMMUNITY): Payer: Self-pay

## 2014-03-11 LAB — URINALYSIS, ROUTINE W REFLEX MICROSCOPIC
Bilirubin Urine: NEGATIVE
Glucose, UA: NEGATIVE mg/dL
KETONES UR: 15 mg/dL — AB
Nitrite: NEGATIVE
PH: 6 (ref 5.0–8.0)
Protein, ur: NEGATIVE mg/dL
Specific Gravity, Urine: 1.011 (ref 1.005–1.030)
Urobilinogen, UA: 0.2 mg/dL (ref 0.0–1.0)

## 2014-03-11 LAB — URINE MICROSCOPIC-ADD ON

## 2014-03-12 ENCOUNTER — Other Ambulatory Visit (HOSPITAL_COMMUNITY): Payer: Self-pay

## 2014-03-12 LAB — CBC
HCT: 27.9 % — ABNORMAL LOW (ref 36.0–46.0)
HEMOGLOBIN: 8.8 g/dL — AB (ref 12.0–15.0)
MCH: 19.6 pg — ABNORMAL LOW (ref 26.0–34.0)
MCHC: 31.5 g/dL (ref 30.0–36.0)
MCV: 62.3 fL — ABNORMAL LOW (ref 78.0–100.0)
Platelets: 489 10*3/uL — ABNORMAL HIGH (ref 150–400)
RBC: 4.48 MIL/uL (ref 3.87–5.11)
RDW: 16.1 % — ABNORMAL HIGH (ref 11.5–15.5)
WBC: 9.6 10*3/uL (ref 4.0–10.5)

## 2014-03-12 LAB — BASIC METABOLIC PANEL
ANION GAP: 18 — AB (ref 5–15)
BUN: 12 mg/dL (ref 6–23)
CHLORIDE: 96 meq/L (ref 96–112)
CO2: 21 mEq/L (ref 19–32)
CREATININE: 0.69 mg/dL (ref 0.50–1.10)
Calcium: 9.2 mg/dL (ref 8.4–10.5)
Glucose, Bld: 140 mg/dL — ABNORMAL HIGH (ref 70–99)
Potassium: 4.9 mEq/L (ref 3.7–5.3)
Sodium: 135 mEq/L — ABNORMAL LOW (ref 137–147)

## 2014-03-12 NOTE — Progress Notes (Signed)
   Name: [name] MRN: [id number] DOB: 06/14/1966    ADMISSION DATE:  03/04/2014 CONSULTATION DATE: 8/20  REFERRING MD :  [facility] PRIMARY SERVICE:  [facility]  CHIEF COMPLAINT:  AMS/Vent dependent  SIGNIFICANT EVENTS / STUDIES:  8/15 CT head: No acute intracranial abnormality. Mild sinusitis  8/15 EEG c/w mild encephalopathy, no seizures  8/16 ct head>>>neg acute, sinusitis Intubated 02/20/14  Extubated 02/21/14  Reintubated 8/8 for stridor. Difficult intubation. 6.5 tube.  02/22/14: Sedated on vent  02/23/14: Overnight agitated and attempting to self extubated. Now deeply sedated with fent and diprivan gtt. Fever curve coming down  02/24/14: still very agitated on WUA; propofil changed to precedex  02/25/14: Neuro suspects hypoglycemia related encephalopathy. Want to rule out stroke. Mom indicated full code and SNF placement desire  02/26/14: climbing CK on diprivan to 1425; approx d5 of diprivan. Agitation continues. Neuro want MRI when feasible.  8/14 Trach  8/17- off fent and versed drips, precedex remains  8/19- followed commands!!!! 8/19 transferred to [facility] 8/27 for 16 hrs of PS  LINES / TUBES: PEG 8/19 >>> Trach 8/14 >>>  CULTURES: uc 8/26>>  ANTIBIOTICS: Per ssh   SUBJECTIVE:  Sedated on precedex, remains on full vent support.   VITAL SIGNS:  Vital signs reviewed. Abnormal values will appear under impression plan section.      PHYSICAL EXAMINATION: General: Chronically ill appearing on vent  Neuro: agitated intermittently, rass -1, on multiple sedatives and antipsychotics, currently chemically stunned.  HEENT: [place]/AT, trach in place, C/D/I. Cardiovascular: RRR, no murmur  Lungs: CTA bilaterally, no W/R/R. Abdomen: Firm ,no guarding, present bowel sounds, PEG in place, C/D/I., note abd film Musculoskeletal: No gross deformities, no edema  Skin: warm, dry.   Recent Labs Lab 03/05/14 1300 03/07/14 0720 03/12/14 0500  NA 135* 133* 135*  K 3.9 5.3 4.9  CL 102 99 96    CO2 18* 17* 21  BUN CREATININE 0.76 0.69 0.69  GLUCOSE 247* 263* 140*    Recent Labs Lab 03/12/14 0500  HGB 8.8*  HCT 27.9*  WBC 9.6  PLT 489*   Dg Abd Portable 1v  03/10/2014   CLINICAL DATA:  Abdominal distention and tightness, pain around PEG tube question obstruction  EXAM: PORTABLE ABDOMEN - 1 VIEW  COMPARISON:  02/27/2014  FINDINGS: Prominent stool in colon.  Gas present to distal colon.  No definite bowel wall thickening.  Gastrostomy tube projects over LEFT mid abdomen.  Minimal levoconvex thoracolumbar scoliosis.  No definite urinary tract calcification.  IMPRESSION: Prominent stool in colon with scattered gas to rectum.  Nonobstructive bowel gas pattern.   Electronically Signed   By: Ulyses Southward M.D.   On: 03/10/2014 15:06    ASSESSMENT  Ventilator dependence with trach Acute respiratory failure with hypoxia Encephalopathy acute presumed secondary to hypoglycemia Altered mental status Hypoglycemia G tube feedings with possible obstruction Prolonged QTC UTI  PLAN: - Vent bundle and wean per protocol. Sedation is an issue. Marland Kitchen -AMS will be the challenge in weaning her. - Continue current medications for AMS - Follow CBG for hypoglycemia - Tube feeds via new peg, may need to hold per ssh. - Follow QTC -Abx per [facility]  Brett Canales Minor ACNP [facility] PCCM Pager [phone] till 3 pm If no answer page [phone] 03/12/2014, 9:05 AM  Patient seen and examined, agree with above note.  I dictated the care and orders written for this patient under my direction.  Alyson Reedy, MD [phone]

## 2014-03-13 LAB — URINE CULTURE
Colony Count: >=100000
Special Requests: NORMAL

## 2014-03-13 NOTE — Progress Notes (Addendum)
[facility]                                                                                              Progress note     Patient Demographics  [name], is a 48 y.o. female  ZOX:[id number]  WJX:[id number]  DOB - September 26, 1965  Admit date - 03/04/2014  Admitting Physician Carron Curie, MD  Outpatient Primary MD for the patient is No PCP Per Patient  LOS - 9   No chief complaint on file.          Subjective:   [name] is obtunded and cannot give any history  Objective:   Vital signs  Temperature 96.9 Heart rate 104 Respiratory rate 16 Blood pressure 107/61 Pulse ox 100%    Exam  Slightly more awake, but still can't give any history and very drowsy [place].AT,PERRAL Supple Neck,No JVD, No cervical lymphadenopathy appriciated. Tracheostomy in place Symmetrical Chest wall movement, Good air movement bilaterally,  RRR,No Gallops,Rubs or new Murmurs, No Parasternal Heave +ve B.Sounds, Abd Soft, Non tender, No organomegaly appreciated, No rebound - guarding or rigidity. PEG tube in place No Cyanosis, Clubbing or edema, No new Rash or bruise     I&Os 1370/1850  Data Review   CBC  Recent Labs Lab 03/12/14 0500  WBC 9.6  HGB 8.8*  HCT 27.9*  PLT 489*  MCV 62.3*  MCH 19.6*  MCHC 31.5  RDW 16.1*    Chemistries   Recent Labs Lab 03/07/14 0720 03/12/14 0500  NA 133* 135*  K 5.3 4.9  CL 99 96  CO2 17* 21  GLUCOSE 263* 140*  BUN 13 12  CREATININE 0.69 0.69  CALCIUM 9.0 9.2   ------------------------------------------------------------------------------------------------------------------ CrCl is unknown because both a height and weight (above a minimum accepted value) are required for this calculation. ------------------------------------------------------------------------------------------------------------------ No results found for this basename: HGBA1C,   in the last 72 hours ------------------------------------------------------------------------------------------------------------------ No results found for this basename: CHOL, HDL, LDLCALC, TRIG, CHOLHDL, LDLDIRECT,  in the last 72 hours ------------------------------------------------------------------------------------------------------------------ No results found for this basename: TSH, T4TOTAL, FREET3, T3FREE, THYROIDAB,  in the last 72 hours ------------------------------------------------------------------------------------------------------------------ No results found for this basename: VITAMINB12, FOLATE, FERRITIN, TIBC, IRON, RETICCTPCT,  in the last 72 hours  Coagulation profile No results found for this basename: INR, PROTIME,  in the last 168 hours  No results found for this basename: DDIMER,  in the last 72 hours  Cardiac Enzymes No results found for this basename: CK, CKMB, TROPONINI, MYOGLOBIN,  in the last 168 hours ------------------------------------------------------------------------------------------------------------------ No components found with this basename: POCBNP,   Micro Results Recent Results (from the past 240 hour(s))  URINE CULTURE     Status: None   Collection Time    03/11/14  2:37 PM      Result Value Ref Range Status   Specimen Description URINE, RANDOM   Final   Special Requests Normal   Final   Culture  Setup Time     Final   Value: 03/11/2014 20:50     Performed at [facility] Count     Final  Value: >=100,000 COLONIES/ML     Performed at [facility]   Culture     Final   Value: GRAM NEGATIVE RODS     Performed at [facility]   Report Status PENDING   Incomplete       Assessment & Plan   Respiratory failure, ventilator dependent status post tracheostomy on 02/27/2014 continue with ATC trials Encephalopathy/delirium due to hypoglycemia we'll continue with psychotropic medications, fish oil  methadone and melatonin with when necessary Versed Prolonged QTC. Monitor Diabetes mellitus only levemir and insulin sliding scale Protein calorie malnutrition on tube feedings per PEG tube Anemia of chronic disease Will monitor hemoglobin Generalized weakness complicated by encephalopathy PT/OT to evaluate Acidosis  Urinary tract infection Tachycardia  Plan Increase Levemir to 17units subcutaneous Qhs Change Rocephin to cefepime Critical care time 32 minutes Start Lopressor 12.5 twice a day  DVT Prophylaxis  heparin   Carron Curie M.D on 03/13/2014 at 1:18 PM

## 2014-03-14 NOTE — Progress Notes (Signed)
[facility]                                                                                              Progress note     Patient Demographics  [name], is a 48 y.o. female  RUE:[id number]  JYN:[id number]  DOB - 06-Jul-1966  Admit date - 03/04/2014  Admitting Physician Carron Curie, MD  Outpatient Primary MD for the patient is No PCP Per Patient  LOS - 10   No chief complaint on file.          Subjective:   [name] is obtunded and cannot give any history  Objective:   Vital signs  Temperature 98.2 Heart rate 101 Respiratory rate 35 Blood pressure 106/66 Pulse ox 97%    Exam  Slightly more awake, but still can't give any history and very drowsy [place].AT,PERRAL Supple Neck,No JVD, No cervical lymphadenopathy appriciated. Tracheostomy in place Symmetrical Chest wall movement, Good air movement bilaterally,  RRR,No Gallops,Rubs or new Murmurs, No Parasternal Heave +ve B.Sounds, Abd Soft, Non tender, No organomegaly appreciated, No rebound - guarding or rigidity. PEG tube in place No Cyanosis, Clubbing or edema, No new Rash or bruise     I&Os 2056/2250  Data Review   CBC  Recent Labs Lab 03/12/14 0500  WBC 9.6  HGB 8.8*  HCT 27.9*  PLT 489*  MCV 62.3*  MCH 19.6*  MCHC 31.5  RDW 16.1*    Chemistries   Recent Labs Lab 03/12/14 0500  NA 135*  K 4.9  CL 96  CO2 21  GLUCOSE 140*  BUN 12  CREATININE 0.69  CALCIUM 9.2   ------------------------------------------------------------------------------------------------------------------ CrCl is unknown because both a height and weight (above a minimum accepted value) are required for this calculation. ------------------------------------------------------------------------------------------------------------------ No results found for this basename: HGBA1C,  in the last 72  hours ------------------------------------------------------------------------------------------------------------------ No results found for this basename: CHOL, HDL, LDLCALC, TRIG, CHOLHDL, LDLDIRECT,  in the last 72 hours ------------------------------------------------------------------------------------------------------------------ No results found for this basename: TSH, T4TOTAL, FREET3, T3FREE, THYROIDAB,  in the last 72 hours ------------------------------------------------------------------------------------------------------------------ No results found for this basename: VITAMINB12, FOLATE, FERRITIN, TIBC, IRON, RETICCTPCT,  in the last 72 hours  Coagulation profile No results found for this basename: INR, PROTIME,  in the last 168 hours  No results found for this basename: DDIMER,  in the last 72 hours  Cardiac Enzymes No results found for this basename: CK, CKMB, TROPONINI, MYOGLOBIN,  in the last 168 hours ------------------------------------------------------------------------------------------------------------------ No components found with this basename: POCBNP,   Micro Results Recent Results (from the past 240 hour(s))  URINE CULTURE     Status: None   Collection Time    03/11/14  2:37 PM      Result Value Ref Range Status   Specimen Description URINE, RANDOM   Final   Special Requests Normal   Final   Culture  Setup Time     Final   Value: 03/11/2014 20:50     Performed at [facility] Count     Final   Value: >=100,000 COLONIES/ML     Performed  at [facility]   Culture     Final   Value: KLEBSIELLA PNEUMONIAE     Performed at [facility]   Report Status 03/13/2014 FINAL   Final   Organism ID, Bacteria KLEBSIELLA PNEUMONIAE   Final       Assessment & Plan   Respiratory failure, ventilator dependent status post tracheostomy on 02/27/2014 continue with ATC trials Encephalopathy/delirium due to hypoglycemia we'll  continue with psychotropic medications, fish oil methadone and melatonin with when necessary Versed Prolonged QTC. Monitor Diabetes mellitus only levemir and insulin sliding scale Protein calorie malnutrition on tube feedings per PEG tube Anemia of chronic disease Will monitor hemoglobin Generalized weakness complicated by encephalopathy PT/OT to evaluate Acidosis  Urinary tract infection Tachycardia on Lopressor twice a day  Plan DC Rocephin Start Cipro 500 twice a day per tube Decrease Klonopin to 1 mg 3 times a day Critical care time 32 minutes  DVT Prophylaxis  heparin   Carron Curie M.D on 03/14/2014 at 11:30 AM

## 2014-03-15 NOTE — Progress Notes (Signed)
[facility]                                                                                              Progress note     Patient Demographics  [name], is a 48 y.o. female  [id number]  [id number]  DOB - 1965/11/20  Admit date - 03/04/2014  Admitting Physician Carron Curie, MD  Outpatient Primary MD for the patient is No PCP Per Patient  LOS - 11   No chief complaint on file.          Subjective:   [name]  Denies  any chest pains   Objective:   Vital signs  Temperature 97.1 Heart rate 70 Respiratory rate 16 Blood pressure 116/74 Pulse ox  100%    Exam  Slightly more awake, little calmer today [place].AT,PERRAL Supple Neck,No JVD, No cervical lymphadenopathy appriciated. Tracheostomy in place Symmetrical Chest wall movement, Good air movement bilaterally,  RRR,No Gallops,Rubs or new Murmurs, No Parasternal Heave +ve B.Sounds, Abd Soft, Non tender, No organomegaly appreciated, No rebound - guarding or rigidity. PEG tube in place No Cyanosis, Clubbing or edema, No new Rash or bruise     I&Os unknown  Data Review   CBC  Recent Labs Lab 03/12/14 0500  WBC 9.6  HGB 8.8*  HCT 27.9*  PLT 489*  MCV 62.3*  MCH 19.6*  MCHC 31.5  RDW 16.1*    Chemistries   Recent Labs Lab 03/12/14 0500  NA 135*  K 4.9  CL 96  CO2 21  GLUCOSE 140*  BUN 12  CREATININE 0.69  CALCIUM 9.2   ------------------------------------------------------------------------------------------------------------------ CrCl is unknown because both a height and weight (above a minimum accepted value) are required for this calculation. ------------------------------------------------------------------------------------------------------------------ No results found for this basename: HGBA1C,  in the last 72  hours ------------------------------------------------------------------------------------------------------------------ No results found for this basename: CHOL, HDL, LDLCALC, TRIG, CHOLHDL, LDLDIRECT,  in the last 72 hours ------------------------------------------------------------------------------------------------------------------ No results found for this basename: TSH, T4TOTAL, FREET3, T3FREE, THYROIDAB,  in the last 72 hours ------------------------------------------------------------------------------------------------------------------ No results found for this basename: VITAMINB12, FOLATE, FERRITIN, TIBC, IRON, RETICCTPCT,  in the last 72 hours  Coagulation profile No results found for this basename: INR, PROTIME,  in the last 168 hours  No results found for this basename: DDIMER,  in the last 72 hours  Cardiac Enzymes No results found for this basename: CK, CKMB, TROPONINI, MYOGLOBIN,  in the last 168 hours ------------------------------------------------------------------------------------------------------------------ No components found with this basename: POCBNP,   Micro Results Recent Results (from the past 240 hour(s))  URINE CULTURE     Status: None   Collection Time    03/11/14  2:37 PM      Result Value Ref Range Status   Specimen Description URINE, RANDOM   Final   Special Requests Normal   Final   Culture  Setup Time     Final   Value: 03/11/2014 20:50     Performed at [facility] Count     Final   Value: >=100,000 COLONIES/ML     Performed at [facility]  Culture     Final   Value: KLEBSIELLA PNEUMONIAE     Performed at [facility]   Report Status 03/13/2014 FINAL   Final   Organism ID, Bacteria KLEBSIELLA PNEUMONIAE   Final       Assessment & Plan   Respiratory failure, ventilator dependent status post tracheostomy on 02/27/2014 continue with ATC trials Encephalopathy/delirium due to hypoglycemia we'll  continue with psychotropic medications, fish oil methadone and melatonin with when necessary Versed Prolonged QTC. Monitor Diabetes mellitus only levemir and insulin sliding scale Protein calorie malnutrition on tube feedings per PEG tube Anemia of chronic disease Will monitor hemoglobin Generalized weakness complicated by encephalopathy PT/OT to evaluate Acidosis  Urinary tract infection Tachycardia on Lopressor twice a day  Plan Decrease Klonopin to 0.5 3 times a day and Seroquel to 400 mg each bedtime  DVT Prophylaxis  heparin   Carron Curie M.D on 03/15/2014 at 9:05 AM

## 2014-03-16 ENCOUNTER — Other Ambulatory Visit (HOSPITAL_COMMUNITY): Payer: Self-pay

## 2014-03-16 LAB — GRAM STAIN: Special Requests: NORMAL

## 2014-03-16 NOTE — Progress Notes (Signed)
[facility]                                                                                              Progress note     Patient Demographics  [name], is a 48 y.o. female  [id number]  [id number]  DOB - Feb 15, 1966  Admit date - 03/04/2014  Admitting Physician Carron Curie, MD  Outpatient Primary MD for the patient is No PCP Per Patient  LOS - 12   No chief complaint on file.          Subjective:   [name]  Denies  any chest pains   Objective:   Vital signs  Temperature 96.4 Heart rate 95 Respiratory rate 18 Blood pressure 108/64 Pulse ox  98%    Exam  Slightly more awake, little calmer today [place].AT,PERRAL Supple Neck,No JVD, No cervical lymphadenopathy appriciated. Tracheostomy in place Symmetrical Chest wall movement, Good air movement bilaterally,  RRR,No Gallops,Rubs or new Murmurs, No Parasternal Heave +ve B.Sounds, Abd Soft, Non tender, No organomegaly appreciated, No rebound - guarding or rigidity. PEG tube in place No Cyanosis, Clubbing or edema, No new Rash or bruise     I&Os -970  Data Review   CBC  Recent Labs Lab 03/12/14 0500  WBC 9.6  HGB 8.8*  HCT 27.9*  PLT 489*  MCV 62.3*  MCH 19.6*  MCHC 31.5  RDW 16.1*    Chemistries   Recent Labs Lab 03/12/14 0500  NA 135*  K 4.9  CL 96  CO2 21  GLUCOSE 140*  BUN 12  CREATININE 0.69  CALCIUM 9.2   ------------------------------------------------------------------------------------------------------------------ CrCl is unknown because both a height and weight (above a minimum accepted value) are required for this calculation. ------------------------------------------------------------------------------------------------------------------ No results found for this basename: HGBA1C,  in the last 72  hours ------------------------------------------------------------------------------------------------------------------ No results found for this basename: CHOL, HDL, LDLCALC, TRIG, CHOLHDL, LDLDIRECT,  in the last 72 hours ------------------------------------------------------------------------------------------------------------------ No results found for this basename: TSH, T4TOTAL, FREET3, T3FREE, THYROIDAB,  in the last 72 hours ------------------------------------------------------------------------------------------------------------------ No results found for this basename: VITAMINB12, FOLATE, FERRITIN, TIBC, IRON, RETICCTPCT,  in the last 72 hours  Coagulation profile No results found for this basename: INR, PROTIME,  in the last 168 hours  No results found for this basename: DDIMER,  in the last 72 hours  Cardiac Enzymes No results found for this basename: CK, CKMB, TROPONINI, MYOGLOBIN,  in the last 168 hours ------------------------------------------------------------------------------------------------------------------ No components found with this basename: POCBNP,   Micro Results Recent Results (from the past 240 hour(s))  URINE CULTURE     Status: None   Collection Time    03/11/14  2:37 PM      Result Value Ref Range Status   Specimen Description URINE, RANDOM   Final   Special Requests Normal   Final   Culture  Setup Time     Final   Value: 03/11/2014 20:50     Performed at [facility] Count     Final   Value: >=100,000 COLONIES/ML     Performed at [facility]  Culture     Final   Value: KLEBSIELLA PNEUMONIAE     Performed at [facility]   Report Status 03/13/2014 FINAL   Final   Organism ID, Bacteria KLEBSIELLA PNEUMONIAE   Final       Assessment & Plan   Respiratory failure, ventilator dependent status post tracheostomy on 02/27/2014 continue with ATC trials Encephalopathy/delirium due to hypoglycemia we'll  continue with psychotropic medications, fish oil methadone and melatonin with when necessary Versed Prolonged QTC. Monitor Diabetes mellitus only levemir and insulin sliding scale Protein calorie malnutrition on tube feedings per PEG tube Anemia of chronic disease Will monitor hemoglobin Generalized weakness complicated by encephalopathy PT/OT to evaluate Acidosis  Urinary tract infection Tachycardia on Lopressor twice a day  Plan  CBC BMP in a.m.  DVT Prophylaxis  heparin   Carron Curie M.D on 03/16/2014 at 10:21 AM

## 2014-03-16 NOTE — Progress Notes (Signed)
   Name: [name] MRN: [id number] DOB: 1966/07/04    ADMISSION DATE:  03/04/2014 CONSULTATION DATE: 8/20  REFERRING MD :  [facility] PRIMARY SERVICE:  [facility]  CHIEF COMPLAINT:  AMS/Vent dependent  SIGNIFICANT EVENTS / STUDIES:  8/15 CT head: No acute intracranial abnormality. Mild sinusitis  8/15 EEG c/w mild encephalopathy, no seizures  8/16 ct head>>>neg acute, sinusitis Intubated 02/20/14  Extubated 02/21/14  Reintubated 8/8 for stridor. Difficult intubation. 6.5 tube.  02/22/14: Sedated on vent  02/23/14: Overnight agitated and attempting to self extubated. Now deeply sedated with fent and diprivan gtt. Fever curve coming down  02/24/14: still very agitated on WUA; propofil changed to precedex  02/25/14: Neuro suspects hypoglycemia related encephalopathy. Want to rule out stroke. Mom indicated full code and SNF placement desire  02/26/14: climbing CK on diprivan to 1425; approx d5 of diprivan. Agitation continues. Neuro want MRI when feasible.  8/14 Trach  8/17- off fent and versed drips, precedex remains  8/19- followed commands!!!! 8/19 transferred to [facility] 8/27 for 16 hrs of PS  LINES / TUBES: PEG 8/19 >>> Trach 8/14 >>>  CULTURES: uc 8/26>>  ANTIBIOTICS: Per ssh   SUBJECTIVE:  Sedated on precedex, remains on full vent support.   VITAL SIGNS:  Vital signs reviewed. Abnormal values will appear under impression plan section.      PHYSICAL EXAMINATION: General: Chronically ill appearing on vent  Neuro: agitated intermittently, rass -1, on multiple sedatives and antipsychotics, currently chemically stunned.  HEENT: [place]/AT, trach in place, C/D/I. Cardiovascular: RRR, no murmur  Lungs: scattered rhonchi, blood tinged trach sec  Abdomen: Firm ,no guarding, present bowel sounds, PEG in place, C/D/I., note abd film Musculoskeletal: No gross deformities, no edema  Skin: warm, dry.   Recent Labs Lab 03/12/14 0500  NA 135*  K 4.9  CL 96  CO2 21  BUN 12  CREATININE 0.69    GLUCOSE 140*    Recent Labs Lab 03/12/14 0500  HGB 8.8*  HCT 27.9*  WBC 9.6  PLT 489*   Dg Chest Port 1 View  03/16/2014   CLINICAL DATA:  Tracheostomy  EXAM: PORTABLE CHEST - 1 VIEW  COMPARISON:  03/08/2014  FINDINGS: Tracheostomy remains in good position. Right arm PICC tip at the cavoatrial junction in good position  The lungs are clear.  Negative for infiltrate or edema.  IMPRESSION: Satisfactory tracheostomy placement.  Lungs remain clear.   Electronically Signed   By: Marlan Palau M.D.   On: 03/16/2014 07:28    ASSESSMENT  Ventilator dependence with trach Acute respiratory failure with hypoxia Encephalopathy acute presumed secondary to hypoglycemia Altered mental status Hypoglycemia G tube feedings with possible obstruction Prolonged QTC UTI  Discussion  Looks good/ tol ATC well. MS improved    PLAN: - push ATC trials  - sputum culture given blood tinged secretions  - Continue current medications for AMS - Follow CBG for hypoglycemia - Tube feeds via new peg, may need to hold per ssh. - Follow QTC -Abx per [facility] -as secretion improved as pmv not doing well, would want to go to 4 cuffless   Simonne Martinet, NP [phone]  I have fully examined this patient and agree with above findings.    Mcarthur Rossetti. Tyson Alias, MD, FACP Pgr: [phone] [facility]

## 2014-03-17 NOTE — Progress Notes (Signed)
[facility]                                                                                              Progress note     Patient Demographics  [name], is a 48 y.o. female  ZOX:[id number]  [id number]  DOB - 1965-10-17  Admit date - 03/04/2014  Admitting Physician Carron Curie, MD  Outpatient Primary MD for the patient is No PCP Per Patient  LOS - 13   No chief complaint on file.          Subjective:   [name]  Denies  any chest pains or shortness of breath  Objective:   Vital signs  Temperature 97.6 Heart rate 104 Respiratory rate 20 Blood pressure102/50 Pulse ox  100%    Exam  Slightly more awake, little calmer today [place].AT,PERRAL Supple Neck,No JVD, No cervical lymphadenopathy appriciated. Tracheostomy in place Symmetrical Chest wall movement, Good air movement bilaterally,  RRR,No Gallops,Rubs or new Murmurs, No Parasternal Heave +ve B.Sounds, Abd Soft, Non tender, No organomegaly appreciated, No rebound - guarding or rigidity. PEG tube in place No Cyanosis, Clubbing or edema, No new Rash or bruise restraints in place    I&Os -163  Data Review   CBC  Recent Labs Lab 03/12/14 0500  WBC 9.6  HGB 8.8*  HCT 27.9*  PLT 489*  MCV 62.3*  MCH 19.6*  MCHC 31.5  RDW 16.1*    Chemistries   Recent Labs Lab 03/12/14 0500  NA 135*  K 4.9  CL 96  CO2 21  GLUCOSE 140*  BUN 12  CREATININE 0.69  CALCIUM 9.2   ------------------------------------------------------------------------------------------------------------------ CrCl is unknown because both a height and weight (above a minimum accepted value) are required for this calculation. ------------------------------------------------------------------------------------------------------------------ No results found for this basename: HGBA1C,  in the last 72  hours ------------------------------------------------------------------------------------------------------------------ No results found for this basename: CHOL, HDL, LDLCALC, TRIG, CHOLHDL, LDLDIRECT,  in the last 72 hours ------------------------------------------------------------------------------------------------------------------ No results found for this basename: TSH, T4TOTAL, FREET3, T3FREE, THYROIDAB,  in the last 72 hours ------------------------------------------------------------------------------------------------------------------ No results found for this basename: VITAMINB12, FOLATE, FERRITIN, TIBC, IRON, RETICCTPCT,  in the last 72 hours  Coagulation profile No results found for this basename: INR, PROTIME,  in the last 168 hours  No results found for this basename: DDIMER,  in the last 72 hours  Cardiac Enzymes No results found for this basename: CK, CKMB, TROPONINI, MYOGLOBIN,  in the last 168 hours ------------------------------------------------------------------------------------------------------------------ No components found with this basename: POCBNP,   Micro Results Recent Results (from the past 240 hour(s))  URINE CULTURE     Status: None   Collection Time    03/11/14  2:37 PM      Result Value Ref Range Status   Specimen Description URINE, RANDOM   Final   Special Requests Normal   Final   Culture  Setup Time     Final   Value: 03/11/2014 20:50     Performed at [facility] Count     Final   Value: >=100,000 COLONIES/ML     Performed at  [facility]     Final   Value: KLEBSIELLA PNEUMONIAE     Performed at [facility]   Report Status 03/13/2014 FINAL   Final   Organism ID, Bacteria KLEBSIELLA PNEUMONIAE   Final  GRAM STAIN     Status: None   Collection Time    03/16/14 10:57 AM      Result Value Ref Range Status   Specimen Description TRACHEAL ASPIRATE   Final   Special Requests Normal   Final    Gram Stain     Final   Value: ABUNDANT WBC PRESENT,BOTH PMN AND MONONUCLEAR     FEW GRAM POSITIVE COCCI IN PAIRS     RARE GRAM NEGATIVE RODS   Report Status 03/16/2014 FINAL   Final  CULTURE, RESPIRATORY (NON-EXPECTORATED)     Status: None   Collection Time    03/16/14 10:58 AM      Result Value Ref Range Status   Specimen Description TRACHEAL ASPIRATE   Final   Special Requests Normal   Final   Gram Stain     Final   Value: ABUNDANT WBC PRESENT,BOTH PMN AND MONONUCLEAR     RARE SQUAMOUS EPITHELIAL CELLS PRESENT     FEW GRAM POSITIVE COCCI     IN PAIRS RARE GRAM NEGATIVE RODS     Performed at [facility]   Culture     Final   Value: NO GROWTH 1 DAY     Performed at [facility]   Report Status PENDING   Incomplete       Assessment & Plan   Respiratory failure, ventilator dependent status post tracheostomy on 02/27/2014 continue with ATC x48 hours which will finish by tomorrow morning Encephalopathy/delirium due to hypoglycemia we'll continue with psychotropic medications, fish oil methadone and melatonin /agitation on restraints Prolonged QTC. Monitor Diabetes mellitus only levemir and insulin sliding scale, still hectic Protein calorie malnutrition on tube feedings per PEG tube Anemia of chronic disease Will monitor hemoglobin Generalized weakness complicated by encephalopathy PT/OT to evaluate Acidosis  Urinary tract infection Tachycardia on Lopressor twice a day  Plan  Change in Levemir to twice a day starting tonight We'll try to remove her restraints in a.m.  DVT Prophylaxis  heparin   Carron Curie M.D on 03/17/2014 at 12:33 PM

## 2014-03-18 ENCOUNTER — Other Ambulatory Visit (HOSPITAL_COMMUNITY): Payer: Self-pay

## 2014-03-18 LAB — CULTURE, RESPIRATORY: SPECIAL REQUESTS: NORMAL

## 2014-03-18 NOTE — Progress Notes (Signed)
[facility]                                                                                              Progress note     Patient Demographics  [name], is a 48 y.o. female  [id number]  [id number]  DOB - Jan 08, 1966  Admit date - 03/04/2014  Admitting Physician Carron Curie, MD  Outpatient Primary MD for the patient is No PCP Per Patient  LOS - 14   No chief complaint on file.          Subjective:   [name]  Denies  any chest pains or shortness of breath, still a bit confused  Objective:   Vital signs  Temperature 97.8 Heart rate 88 Respiratory rate 12 Blood pressure 98/52 Pulse ox  100%    Exam  Slightly more awake, little calmer today [place].AT,PERRAL Supple Neck,No JVD, No cervical lymphadenopathy appriciated. Tracheostomy in place Symmetrical Chest wall movement, Good air movement bilaterally,  RRR,No Gallops,Rubs or new Murmurs, No Parasternal Heave +ve B.Sounds, Abd Soft, Non tender, No organomegaly appreciated, No rebound - guarding or rigidity. PEG tube pulled out slightly No Cyanosis, Clubbing or edema, No new Rash or bruise restraints in place    I&Os unknown  Data Review   CBC  Recent Labs Lab 03/12/14 0500  WBC 9.6  HGB 8.8*  HCT 27.9*  PLT 489*  MCV 62.3*  MCH 19.6*  MCHC 31.5  RDW 16.1*    Chemistries   Recent Labs Lab 03/12/14 0500  NA 135*  K 4.9  CL 96  CO2 21  GLUCOSE 140*  BUN 12  CREATININE 0.69  CALCIUM 9.2   ------------------------------------------------------------------------------------------------------------------ CrCl is unknown because both a height and weight (above a minimum accepted value) are required for this calculation. ------------------------------------------------------------------------------------------------------------------ No results found for this basename: HGBA1C,  in the last 72  hours ------------------------------------------------------------------------------------------------------------------ No results found for this basename: CHOL, HDL, LDLCALC, TRIG, CHOLHDL, LDLDIRECT,  in the last 72 hours ------------------------------------------------------------------------------------------------------------------ No results found for this basename: TSH, T4TOTAL, FREET3, T3FREE, THYROIDAB,  in the last 72 hours ------------------------------------------------------------------------------------------------------------------ No results found for this basename: VITAMINB12, FOLATE, FERRITIN, TIBC, IRON, RETICCTPCT,  in the last 72 hours  Coagulation profile No results found for this basename: INR, PROTIME,  in the last 168 hours  No results found for this basename: DDIMER,  in the last 72 hours  Cardiac Enzymes No results found for this basename: CK, CKMB, TROPONINI, MYOGLOBIN,  in the last 168 hours ------------------------------------------------------------------------------------------------------------------ No components found with this basename: POCBNP,   Micro Results Recent Results (from the past 240 hour(s))  URINE CULTURE     Status: None   Collection Time    03/11/14  2:37 PM      Result Value Ref Range Status   Specimen Description URINE, RANDOM   Final   Special Requests Normal   Final   Culture  Setup Time     Final   Value: 03/11/2014 20:50     Performed at [facility] Count     Final   Value: >=100,000 COLONIES/ML  Performed at [facility]     Final   Value: KLEBSIELLA PNEUMONIAE     Performed at [facility]   Report Status 03/13/2014 FINAL   Final   Organism ID, Bacteria KLEBSIELLA PNEUMONIAE   Final  GRAM STAIN     Status: None   Collection Time    03/16/14 10:57 AM      Result Value Ref Range Status   Specimen Description TRACHEAL ASPIRATE   Final   Special Requests Normal   Final    Gram Stain     Final   Value: ABUNDANT WBC PRESENT,BOTH PMN AND MONONUCLEAR     FEW GRAM POSITIVE COCCI IN PAIRS     RARE GRAM NEGATIVE RODS   Report Status 03/16/2014 FINAL   Final  CULTURE, RESPIRATORY (NON-EXPECTORATED)     Status: None   Collection Time    03/16/14 10:58 AM      Result Value Ref Range Status   Specimen Description TRACHEAL ASPIRATE   Final   Special Requests Normal   Final   Gram Stain     Final   Value: ABUNDANT WBC PRESENT,BOTH PMN AND MONONUCLEAR     RARE SQUAMOUS EPITHELIAL CELLS PRESENT     FEW GRAM POSITIVE COCCI     IN PAIRS RARE GRAM NEGATIVE RODS     Performed at [facility]   Culture     Final   Value: Non-Pathogenic Oropharyngeal-type [place] Isolated.     Performed at [facility]   Report Status 03/18/2014 FINAL   Final       Assessment & Plan   Respiratory failure, ventilator dependent status post tracheostomy continue with ATC Encephalopathy/delirium due to hypoglycemia we'll continue with psychotropic medications, fish oil methadone and melatonin /agitation on restraints-today pulled her gastric tube Prolonged QTC. Monitor Diabetes mellitus only levemir and insulin sliding scale, still hectic Protein calorie malnutrition on tube feedings per PEG tube Anemia of chronic disease Will monitor hemoglobin Generalized weakness complicated by encephalopathy PT/OT to evaluate Acidosis  Urinary tract infection Tachycardia on Lopressor twice a day  Plan  Changed Levemir to 12 units subcutaneous twice a day Change clonazepam to when necessary Decrease methadone to 10 mg in a.m. and 5 mg at night Decrease Seroquel 200 mg each bedtime CBC BMP portable chest x-ray in a.m. Keep restraints on Consult IR for PEG tube evaluation Critical care time 33 minutes  DVT Prophylaxis  heparin   Carron Curie M.D on 03/18/2014 at 11:34 AM

## 2014-03-19 ENCOUNTER — Other Ambulatory Visit (HOSPITAL_COMMUNITY): Payer: Self-pay

## 2014-03-19 ENCOUNTER — Other Ambulatory Visit (HOSPITAL_COMMUNITY): Payer: Medicaid Other

## 2014-03-19 LAB — CBC
HCT: 28.3 % — ABNORMAL LOW (ref 36.0–46.0)
Hemoglobin: 8.7 g/dL — ABNORMAL LOW (ref 12.0–15.0)
MCH: 19.4 pg — ABNORMAL LOW (ref 26.0–34.0)
MCHC: 30.7 g/dL (ref 30.0–36.0)
MCV: 63 fL — ABNORMAL LOW (ref 78.0–100.0)
Platelets: ADEQUATE 10*3/uL (ref 150–400)
RBC: 4.49 MIL/uL (ref 3.87–5.11)
RDW: 16 % — ABNORMAL HIGH (ref 11.5–15.5)
WBC: 6.2 10*3/uL (ref 4.0–10.5)

## 2014-03-19 LAB — BASIC METABOLIC PANEL
Anion gap: 14 (ref 5–15)
BUN: 17 mg/dL (ref 6–23)
CALCIUM: 9.1 mg/dL (ref 8.4–10.5)
CO2: 28 mEq/L (ref 19–32)
Chloride: 95 mEq/L — ABNORMAL LOW (ref 96–112)
Creatinine, Ser: 0.58 mg/dL (ref 0.50–1.10)
GLUCOSE: 217 mg/dL — AB (ref 70–99)
POTASSIUM: 4.8 meq/L (ref 3.7–5.3)
SODIUM: 137 meq/L (ref 137–147)

## 2014-03-19 MED ORDER — IOHEXOL 300 MG/ML  SOLN
50.0000 mL | Freq: Once | INTRAMUSCULAR | Status: AC | PRN
  Administered 2014-03-19: 8 mL via INTRAVENOUS

## 2014-03-19 NOTE — Progress Notes (Addendum)
[facility]                                                                                              Progress note     Patient Demographics  [name], is a 48 y.o. female  ZOX:[id number]  [id number]  DOB - [birth year]  Admit date - 03/04/2014  Admitting Physician Carron Curie, MD  Outpatient Primary MD for the patient is No PCP Per Patient  LOS - 15   No chief complaint on file.          Subjective:   [name]  Denies  any chest pains or shortness of breath, still a bit confused PEG tube replaced by IR  Objective:   Vital signs  Temperature 97.2 Heart rate 114 Respiratory rate 16 Blood pressure 139/79 Pulse ox  100%    Exam  Slightly more awake, little calmer today [place].AT,PERRAL Supple Neck,No JVD, No cervical lymphadenopathy appriciated. Tracheostomy in place Symmetrical Chest wall movement, Good air movement bilaterally,  RRR,No Gallops,Rubs or new Murmurs, No Parasternal Heave +ve B.Sounds, Abd Soft, Non tender, No organomegaly appreciated, No rebound - guarding or rigidity. PEG tube replaced by IR No Cyanosis, Clubbing or edema, No new Rash or bruise restraints in place    I&Os -460  Data Review   CBC  Recent Labs Lab 03/19/14 0500  WBC 6.2  HGB 8.7*  HCT 28.3*  PLT PLATELET CLUMPS NOTED ON SMEAR, COUNT APPEARS ADEQUATE  MCV 63.0*  MCH 19.4*  MCHC 30.7  RDW 16.0*    Chemistries   Recent Labs Lab 03/19/14 0500  NA 137  K 4.8  CL 95*  CO2 28  GLUCOSE 217*  BUN 17  CREATININE 0.58  CALCIUM 9.1   ------------------------------------------------------------------------------------------------------------------ CrCl is unknown because both a height and weight (above a minimum accepted value) are required for this  calculation. ------------------------------------------------------------------------------------------------------------------ No results found for this basename: HGBA1C,  in the last 72 hours ------------------------------------------------------------------------------------------------------------------ No results found for this basename: CHOL, HDL, LDLCALC, TRIG, CHOLHDL, LDLDIRECT,  in the last 72 hours ------------------------------------------------------------------------------------------------------------------ No results found for this basename: TSH, T4TOTAL, FREET3, T3FREE, THYROIDAB,  in the last 72 hours ------------------------------------------------------------------------------------------------------------------ No results found for this basename: VITAMINB12, FOLATE, FERRITIN, TIBC, IRON, RETICCTPCT,  in the last 72 hours  Coagulation profile No results found for this basename: INR, PROTIME,  in the last 168 hours  No results found for this basename: DDIMER,  in the last 72 hours  Cardiac Enzymes No results found for this basename: CK, CKMB, TROPONINI, MYOGLOBIN,  in the last 168 hours ------------------------------------------------------------------------------------------------------------------ No components found with this basename: POCBNP,   Micro Results Recent Results (from the past 240 hour(s))  URINE CULTURE     Status: None   Collection Time    03/11/14  2:37 PM      Result Value Ref Range Status   Specimen Description URINE, RANDOM   Final   Special Requests Normal   Final   Culture  Setup Time     Final   Value: 03/11/2014 20:50     Performed at [facility]  Colony Count     Final   Value: >=100,000 COLONIES/ML     Performed at [facility]   Culture     Final   Value: KLEBSIELLA PNEUMONIAE     Performed at [facility]   Report Status 03/13/2014 FINAL   Final   Organism ID, Bacteria KLEBSIELLA PNEUMONIAE   Final  GRAM  STAIN     Status: None   Collection Time    03/16/14 10:57 AM      Result Value Ref Range Status   Specimen Description TRACHEAL ASPIRATE   Final   Special Requests Normal   Final   Gram Stain     Final   Value: ABUNDANT WBC PRESENT,BOTH PMN AND MONONUCLEAR     FEW GRAM POSITIVE COCCI IN PAIRS     RARE GRAM NEGATIVE RODS   Report Status 03/16/2014 FINAL   Final  CULTURE, RESPIRATORY (NON-EXPECTORATED)     Status: None   Collection Time    03/16/14 10:58 AM      Result Value Ref Range Status   Specimen Description TRACHEAL ASPIRATE   Final   Special Requests Normal   Final   Gram Stain     Final   Value: ABUNDANT WBC PRESENT,BOTH PMN AND MONONUCLEAR     RARE SQUAMOUS EPITHELIAL CELLS PRESENT     FEW GRAM POSITIVE COCCI     IN PAIRS RARE GRAM NEGATIVE RODS     Performed at [facility]   Culture     Final   Value: Non-Pathogenic Oropharyngeal-type Flora Isolated.     Performed at [facility]   Report Status 03/18/2014 FINAL   Final       Assessment & Plan   Respiratory failure, ventilator dependent status post tracheostomy continue with ATC down sized to #4 cuffless Encephalopathy/delirium due to hypoglycemia we'll continue with psychotropic medications, fish oil methadone and melatonin /agitation on restraints-today pulled her gastric tube Prolonged QTC. Monitor Diabetes mellitus only levemir and insulin sliding scale, still hectic Protein calorie malnutrition on tube feedings per PEG tube Anemia of chronic disease Will monitor hemoglobin Generalized weakness complicated by encephalopathy PT/OT to evaluate Acidosis  Urinary tract infection Tachycardia on Lopressor twice a day  Plan  Down size tracheostomy to a #4 cuffless Restart tube feeding per PEG after her PEG replaced by IR Restart Levemir  Carron Curie M.D on 03/19/2014 at 12:05 PM

## 2014-03-20 NOTE — Progress Notes (Addendum)
[facility]                                                                                              Progress note     Patient Demographics  [name], is a 48 y.o. female  ZOX:[id number]  WJX:[id number]  DOB - 25-Apr-1966  Admit date - 03/04/2014  Admitting Physician Carron Curie, MD  Outpatient Primary MD for the patient is No PCP Per Patient  LOS - 16   No chief complaint on file.          Subjective:   [name]  patient has no new complaints but refusing to talk to [place] with PMV  Objective:   Vital signs  Temperature 97.8 Heart rate 97 Respiratory rate 12 Blood pressure 106/55 Pulse ox  97%    Exam  Slightly more awake, little calmer today [name],PERRAL Supple Neck,No JVD, No cervical lymphadenopathy appriciated. Tracheostomy in place Symmetrical Chest wall movement, Good air movement bilaterally,  RRR,No Gallops,Rubs or new Murmurs, No Parasternal Heave +ve B.Sounds, Abd Soft, Non tender, No organomegaly appreciated, No rebound - guarding or rigidity. PEG tube replaced by IR No Cyanosis, Clubbing or edema, No new Rash or bruise restraints in place    I&Os -355  Data Review   CBC  Recent Labs Lab 03/19/14 0500  WBC 6.2  HGB 8.7*  HCT 28.3*  PLT PLATELET CLUMPS NOTED ON SMEAR, COUNT APPEARS ADEQUATE  MCV 63.0*  MCH 19.4*  MCHC 30.7  RDW 16.0*    Chemistries   Recent Labs Lab 03/19/14 0500  NA 137  K 4.8  CL 95*  CO2 28  GLUCOSE 217*  BUN 17  CREATININE 0.58  CALCIUM 9.1   ------------------------------------------------------------------------------------------------------------------ CrCl is unknown because both a height and weight (above a minimum accepted value) are required for this calculation. ------------------------------------------------------------------------------------------------------------------ No results found for this  basename: HGBA1C,  in the last 72 hours ------------------------------------------------------------------------------------------------------------------ No results found for this basename: CHOL, HDL, LDLCALC, TRIG, CHOLHDL, LDLDIRECT,  in the last 72 hours ------------------------------------------------------------------------------------------------------------------ No results found for this basename: TSH, T4TOTAL, FREET3, T3FREE, THYROIDAB,  in the last 72 hours ------------------------------------------------------------------------------------------------------------------ No results found for this basename: VITAMINB12, FOLATE, FERRITIN, TIBC, IRON, RETICCTPCT,  in the last 72 hours  Coagulation profile No results found for this basename: INR, PROTIME,  in the last 168 hours  No results found for this basename: DDIMER,  in the last 72 hours  Cardiac Enzymes No results found for this basename: CK, CKMB, TROPONINI, MYOGLOBIN,  in the last 168 hours ------------------------------------------------------------------------------------------------------------------ No components found with this basename: POCBNP,   Micro Results Recent Results (from the past 240 hour(s))  URINE CULTURE     Status: None   Collection Time    03/11/14  2:37 PM      Result Value Ref Range Status   Specimen Description URINE, RANDOM   Final   Special Requests Normal   Final   Culture  Setup Time     Final   Value: 03/11/2014 20:50     Performed at [facility] Count  Final   Value: >=100,000 COLONIES/ML     Performed at [facility]   Culture     Final   Value: KLEBSIELLA PNEUMONIAE     Performed at [facility]   Report Status 03/13/2014 FINAL   Final   Organism ID, Bacteria KLEBSIELLA PNEUMONIAE   Final  GRAM STAIN     Status: None   Collection Time    03/16/14 10:57 AM      Result Value Ref Range Status   Specimen Description TRACHEAL ASPIRATE   Final    Special Requests Normal   Final   Gram Stain     Final   Value: ABUNDANT WBC PRESENT,BOTH PMN AND MONONUCLEAR     FEW GRAM POSITIVE COCCI IN PAIRS     RARE GRAM NEGATIVE RODS   Report Status 03/16/2014 FINAL   Final  CULTURE, RESPIRATORY (NON-EXPECTORATED)     Status: None   Collection Time    03/16/14 10:58 AM      Result Value Ref Range Status   Specimen Description TRACHEAL ASPIRATE   Final   Special Requests Normal   Final   Gram Stain     Final   Value: ABUNDANT WBC PRESENT,BOTH PMN AND MONONUCLEAR     RARE SQUAMOUS EPITHELIAL CELLS PRESENT     FEW GRAM POSITIVE COCCI     IN PAIRS RARE GRAM NEGATIVE RODS     Performed at [facility]   Culture     Final   Value: Non-Pathogenic Oropharyngeal-type Flora Isolated.     Performed at [facility]   Report Status 03/18/2014 FINAL   Final       Assessment & Plan   Respiratory failure, ventilator dependent status post tracheostomy continue with ATC 28% down sized to #4 cuffless tolerating PMV Encephalopathy/delirium due to hypoglycemia we'll continue with psychotropic medications, fish oil methadone and melatonin /agitation off restraints Prolonged QTC. Monitor Diabetes mellitus only levemir and insulin sliding scale, still hectic Protein calorie malnutrition on tube feedings per PEG tube Anemia of chronic disease Will monitor hemoglobin Generalized weakness complicated by encephalopathy PT/OT to evaluate Acidosis  Urinary tract infection Tachycardia on Lopressor twice a day  Plan  Continue same treatment Start FeSO4 twice a day Discontinue restraints  Carron Curie M.D on 03/20/2014 at 1:45 PM

## 2014-03-21 NOTE — Progress Notes (Addendum)
[facility]                                                                                              Progress note     Patient Demographics  [name], is a 48 y.o. female  ZOX:[id number]  WJX:[id number]  DOB - 1966/03/06  Admit date - 03/04/2014  Admitting Physician Carron Curie, MD  Outpatient Primary MD for the patient is No PCP Per Patient  LOS - 17   No chief complaint on file.          Subjective:   [name]  patient has no new complaints but refusing to talk to [place] with PMV  Objective:   Vital signs  Temperature 99 Heart rate 82 Respiratory rate 20 Blood pressure 124/62 Pulse ox 100%    Exam  Slightly more awake, little calmer today [place].AT,PERRAL Supple Neck,No JVD, No cervical lymphadenopathy appriciated. Tracheostomy in place Symmetrical Chest wall movement, Good air movement bilaterally,  RRR,No Gallops,Rubs or new Murmurs, No Parasternal Heave +ve B.Sounds, Abd Soft, Non tender, No organomegaly appreciated, No rebound - guarding or rigidity. PEG tube replaced by IR No Cyanosis, Clubbing or edema, No new Rash or bruise restraints in place    I&Os 1085  Data Review   CBC  Recent Labs Lab 03/19/14 0500  WBC 6.2  HGB 8.7*  HCT 28.3*  PLT PLATELET CLUMPS NOTED ON SMEAR, COUNT APPEARS ADEQUATE  MCV 63.0*  MCH 19.4*  MCHC 30.7  RDW 16.0*    Chemistries   Recent Labs Lab 03/19/14 0500  NA 137  K 4.8  CL 95*  CO2 28  GLUCOSE 217*  BUN 17  CREATININE 0.58  CALCIUM 9.1   ------------------------------------------------------------------------------------------------------------------ CrCl is unknown because both a height and weight (above a minimum accepted value) are required for this calculation. ------------------------------------------------------------------------------------------------------------------ No results found for this  basename: HGBA1C,  in the last 72 hours ------------------------------------------------------------------------------------------------------------------ No results found for this basename: CHOL, HDL, LDLCALC, TRIG, CHOLHDL, LDLDIRECT,  in the last 72 hours ------------------------------------------------------------------------------------------------------------------ No results found for this basename: TSH, T4TOTAL, FREET3, T3FREE, THYROIDAB,  in the last 72 hours ------------------------------------------------------------------------------------------------------------------ No results found for this basename: VITAMINB12, FOLATE, FERRITIN, TIBC, IRON, RETICCTPCT,  in the last 72 hours  Coagulation profile No results found for this basename: INR, PROTIME,  in the last 168 hours  No results found for this basename: DDIMER,  in the last 72 hours  Cardiac Enzymes No results found for this basename: CK, CKMB, TROPONINI, MYOGLOBIN,  in the last 168 hours ------------------------------------------------------------------------------------------------------------------ No components found with this basename: POCBNP,   Micro Results Recent Results (from the past 240 hour(s))  URINE CULTURE     Status: None   Collection Time    03/11/14  2:37 PM      Result Value Ref Range Status   Specimen Description URINE, RANDOM   Final   Special Requests Normal   Final   Culture  Setup Time     Final   Value: 03/11/2014 20:50     Performed at [facility] Count  Final   Value: >=100,000 COLONIES/ML     Performed at [facility]   Culture     Final   Value: KLEBSIELLA PNEUMONIAE     Performed at [facility]   Report Status 03/13/2014 FINAL   Final   Organism ID, Bacteria KLEBSIELLA PNEUMONIAE   Final  GRAM STAIN     Status: None   Collection Time    03/16/14 10:57 AM      Result Value Ref Range Status   Specimen Description TRACHEAL ASPIRATE   Final    Special Requests Normal   Final   Gram Stain     Final   Value: ABUNDANT WBC PRESENT,BOTH PMN AND MONONUCLEAR     FEW GRAM POSITIVE COCCI IN PAIRS     RARE GRAM NEGATIVE RODS   Report Status 03/16/2014 FINAL   Final  CULTURE, RESPIRATORY (NON-EXPECTORATED)     Status: None   Collection Time    03/16/14 10:58 AM      Result Value Ref Range Status   Specimen Description TRACHEAL ASPIRATE   Final   Special Requests Normal   Final   Gram Stain     Final   Value: ABUNDANT WBC PRESENT,BOTH PMN AND MONONUCLEAR     RARE SQUAMOUS EPITHELIAL CELLS PRESENT     FEW GRAM POSITIVE COCCI     IN PAIRS RARE GRAM NEGATIVE RODS     Performed at [facility]   Culture     Final   Value: Non-Pathogenic Oropharyngeal-type Flora Isolated.     Performed at [facility]   Report Status 03/18/2014 FINAL   Final       Assessment & Plan   Respiratory failure, ventilator dependent status post tracheostomy continue with ATC 28% down sized to #4 cuffless tolerating PMV Encephalopathy/delirium due to hypoglycemia we'll continue with psychotropic medications, fish oil methadone and melatonin /agitation off restraints Prolonged QTC. Monitor Diabetes mellitus only levemir and insulin sliding scale, still hectic Protein calorie malnutrition on tube feedings per PEG tube Anemia of chronic disease Will monitor hemoglobin Generalized weakness complicated by encephalopathy PT/OT to evaluate Acidosis  Urinary tract infection Tachycardia on Lopressor twice a day  Plan  Continue same treatment  Carron Curie M.D on 03/21/2014 at 2:10 PM

## 2014-03-22 NOTE — Progress Notes (Signed)
[facility]                                                                                              Progress note     Patient Demographics  [name], is a 48 y.o. female  UEA:[id number]  [id number]  DOB - 1966-04-11  Admit date - 03/04/2014  Admitting Physician Carron Curie, MD  Outpatient Primary MD for the patient is No PCP Per Patient  LOS - 18   No chief complaint on file.          Subjective:   [name]  patient has no new complaints but refusing to talk to [place] with PMV  Objective:   Vital signs  Temperature 97 Heart rate 83 Respiratory rate 15 Blood pressure 124/75 Pulse ox 100%    Exam  Slightly more awake, little calmer today [place].AT,PERRAL Supple Neck,No JVD, No cervical lymphadenopathy appriciated. Tracheostomy in place Symmetrical Chest wall movement, Good air movement bilaterally,  RRR,No Gallops,Rubs or new Murmurs, No Parasternal Heave +ve B.Sounds, Abd Soft, Non tender, No organomegaly appreciated, No rebound - guarding or rigidity. PEG tube replaced by IR No Cyanosis, Clubbing or edema, No new Rash or bruise restraints in place    I&Os 810  Data Review   CBC  Recent Labs Lab 03/19/14 0500  WBC 6.2  HGB 8.7*  HCT 28.3*  PLT PLATELET CLUMPS NOTED ON SMEAR, COUNT APPEARS ADEQUATE  MCV 63.0*  MCH 19.4*  MCHC 30.7  RDW 16.0*    Chemistries   Recent Labs Lab 03/19/14 0500  NA 137  K 4.8  CL 95*  CO2 28  GLUCOSE 217*  BUN 17  CREATININE 0.58  CALCIUM 9.1   ------------------------------------------------------------------------------------------------------------------ CrCl is unknown because both a height and weight (above a minimum accepted value) are required for this calculation. ------------------------------------------------------------------------------------------------------------------ No results found for this  basename: HGBA1C,  in the last 72 hours ------------------------------------------------------------------------------------------------------------------ No results found for this basename: CHOL, HDL, LDLCALC, TRIG, CHOLHDL, LDLDIRECT,  in the last 72 hours ------------------------------------------------------------------------------------------------------------------ No results found for this basename: TSH, T4TOTAL, FREET3, T3FREE, THYROIDAB,  in the last 72 hours ------------------------------------------------------------------------------------------------------------------ No results found for this basename: VITAMINB12, FOLATE, FERRITIN, TIBC, IRON, RETICCTPCT,  in the last 72 hours  Coagulation profile No results found for this basename: INR, PROTIME,  in the last 168 hours  No results found for this basename: DDIMER,  in the last 72 hours  Cardiac Enzymes No results found for this basename: CK, CKMB, TROPONINI, MYOGLOBIN,  in the last 168 hours ------------------------------------------------------------------------------------------------------------------ No components found with this basename: POCBNP,   Micro Results Recent Results (from the past 240 hour(s))  GRAM STAIN     Status: None   Collection Time    03/16/14 10:57 AM      Result Value Ref Range Status   Specimen Description TRACHEAL ASPIRATE   Final   Special Requests Normal   Final   Gram Stain     Final   Value: ABUNDANT WBC PRESENT,BOTH PMN AND MONONUCLEAR     FEW GRAM POSITIVE COCCI IN PAIRS     RARE GRAM  NEGATIVE RODS   Report Status 03/16/2014 FINAL   Final  CULTURE, RESPIRATORY (NON-EXPECTORATED)     Status: None   Collection Time    03/16/14 10:58 AM      Result Value Ref Range Status   Specimen Description TRACHEAL ASPIRATE   Final   Special Requests Normal   Final   Gram Stain     Final   Value: ABUNDANT WBC PRESENT,BOTH PMN AND MONONUCLEAR     RARE SQUAMOUS EPITHELIAL CELLS PRESENT     FEW GRAM  POSITIVE COCCI     IN PAIRS RARE GRAM NEGATIVE RODS     Performed at [facility]   Culture     Final   Value: Non-Pathogenic Oropharyngeal-type Flora Isolated.     Performed at [facility]   Report Status 03/18/2014 FINAL   Final       Assessment & Plan   Respiratory failure, ventilator dependent status post tracheostomy continue with ATC 28% down sized to #4 cuffless tolerating PMV Encephalopathy/delirium due to hypoglycemia we'll continue with psychotropic medications, fish oil methadone and melatonin /agitation off restraints Prolonged QTC. Monitor Diabetes mellitus only levemir and insulin sliding scale, still hectic Protein calorie malnutrition on tube feedings per PEG tube Anemia of chronic disease Will monitor hemoglobin Generalized weakness complicated by encephalopathy PT/OT to evaluate Acidosis  Urinary tract infection Tachycardia on Lopressor twice a day  Plan  Check labs in a.m.  Carron Curie M.D on 03/22/2014 at 12:16 PM

## 2014-03-23 LAB — CBC
HCT: 30.6 % — ABNORMAL LOW (ref 36.0–46.0)
Hemoglobin: 9.4 g/dL — ABNORMAL LOW (ref 12.0–15.0)
MCH: 19.4 pg — ABNORMAL LOW (ref 26.0–34.0)
MCHC: 30.7 g/dL (ref 30.0–36.0)
MCV: 63.2 fL — AB (ref 78.0–100.0)
PLATELETS: 376 10*3/uL (ref 150–400)
RBC: 4.84 MIL/uL (ref 3.87–5.11)
RDW: 16 % — AB (ref 11.5–15.5)
WBC: 4.8 10*3/uL (ref 4.0–10.5)

## 2014-03-23 LAB — BASIC METABOLIC PANEL
ANION GAP: 13 (ref 5–15)
BUN: 25 mg/dL — ABNORMAL HIGH (ref 6–23)
CHLORIDE: 92 meq/L — AB (ref 96–112)
CO2: 28 mEq/L (ref 19–32)
CREATININE: 0.65 mg/dL (ref 0.50–1.10)
Calcium: 9.2 mg/dL (ref 8.4–10.5)
GFR calc non Af Amer: >90 mL/min (ref >90)
Glucose, Bld: 539 mg/dL — ABNORMAL HIGH (ref 70–99)
Potassium: 5.2 mEq/L (ref 3.7–5.3)
SODIUM: 133 meq/L — AB (ref 137–147)

## 2014-03-23 LAB — PROCALCITONIN

## 2014-03-23 NOTE — Progress Notes (Signed)
[facility]                                                                                              Progress note     Patient Demographics  [name], is a 48 y.o. female  ZOX:[id number]  [id number]:[id number]  DOB - [birth year]  Admit date - 03/04/2014  Admitting Physician Carron Curie, MD  Outpatient Primary MD for the patient is No PCP Per Patient  LOS - 19   No chief complaint on file.          Subjective:   [name]  became very agitated at night and very aggressive  Objective:   Vital signs  Temperature 97.8 Heart rate 86 Respiratory rate 14 Blood pressure 121/84 Pulse ox 100%    Exam  Confused and agitated [place].AT,PERRAL Supple Neck,No JVD, No cervical lymphadenopathy appriciated. Tracheostomy in place Symmetrical Chest wall movement, Good air movement bilaterally,  RRR,No Gallops,Rubs or new Murmurs, No Parasternal Heave +ve B.Sounds, Abd Soft, Non tender, No organomegaly appreciated, No rebound - guarding or rigidity. PEG tube replaced by IR No Cyanosis, Clubbing or edema, No new Rash or bruise restraints in place    I&Os unknown  Data Review   CBC  Recent Labs Lab 03/19/14 0500 03/23/14 0500  WBC 6.2 4.8  HGB 8.7* 9.4*  HCT 28.3* 30.6*  PLT PLATELET CLUMPS NOTED ON SMEAR, COUNT APPEARS ADEQUATE 376  MCV 63.0* 63.2*  MCH 19.4* 19.4*  MCHC 30.7 30.7  RDW 16.0* 16.0*    Chemistries   Recent Labs Lab 03/19/14 0500 03/23/14 0500  NA 137 133*  K 4.8 5.2  CL 95* 92*  CO2 28 28  GLUCOSE 217* 539*  BUN 17 25*  CREATININE 0.58 0.65  CALCIUM 9.1 9.2   ------------------------------------------------------------------------------------------------------------------ CrCl is unknown because both a height and weight (above a minimum accepted value) are required for this  calculation. ------------------------------------------------------------------------------------------------------------------ No results found for this basename: HGBA1C,  in the last 72 hours ------------------------------------------------------------------------------------------------------------------ No results found for this basename: CHOL, HDL, LDLCALC, TRIG, CHOLHDL, LDLDIRECT,  in the last 72 hours ------------------------------------------------------------------------------------------------------------------ No results found for this basename: TSH, T4TOTAL, FREET3, T3FREE, THYROIDAB,  in the last 72 hours ------------------------------------------------------------------------------------------------------------------ No results found for this basename: VITAMINB12, FOLATE, FERRITIN, TIBC, IRON, RETICCTPCT,  in the last 72 hours  Coagulation profile No results found for this basename: INR, PROTIME,  in the last 168 hours  No results found for this basename: DDIMER,  in the last 72 hours  Cardiac Enzymes No results found for this basename: CK, CKMB, TROPONINI, MYOGLOBIN,  in the last 168 hours ------------------------------------------------------------------------------------------------------------------ No components found with this basename: POCBNP,   Micro Results Recent Results (from the past 240 hour(s))  GRAM STAIN     Status: None   Collection Time    03/16/14 10:57 AM      Result Value Ref Range Status   Specimen Description TRACHEAL ASPIRATE   Final   Special Requests Normal   Final   Gram Stain     Final   Value: ABUNDANT WBC PRESENT,BOTH PMN AND MONONUCLEAR  FEW GRAM POSITIVE COCCI IN PAIRS     RARE GRAM NEGATIVE RODS   Report Status 03/16/2014 FINAL   Final  CULTURE, RESPIRATORY (NON-EXPECTORATED)     Status: None   Collection Time    03/16/14 10:58 AM      Result Value Ref Range Status   Specimen Description TRACHEAL ASPIRATE   Final   Special  Requests Normal   Final   Gram Stain     Final   Value: ABUNDANT WBC PRESENT,BOTH PMN AND MONONUCLEAR     RARE SQUAMOUS EPITHELIAL CELLS PRESENT     FEW GRAM POSITIVE COCCI     IN PAIRS RARE GRAM NEGATIVE RODS     Performed at [facility]   Culture     Final   Value: Non-Pathogenic Oropharyngeal-type Flora Isolated.     Performed at [facility]   Report Status 03/18/2014 FINAL   Final       Assessment & Plan   Respiratory failure, ventilator dependent status post tracheostomy continue with ATC 28% down sized to #4 cuffless tolerating PMV Encephalopathy/delirium due to hypoglycemia we'll continue with psychotropic medications, fish oil methadone and melatonin /agitation on restraints. restarted Geodon Prolonged QTC. Monitor, can't give Haldol Diabetes mellitus only levemir and insulin sliding scale, still hectic Protein calorie malnutrition on tube feedings per PEG tube Anemia of chronic disease Will monitor hemoglobin Generalized weakness complicated by encephalopathy PT/OT to evaluate Acidosis  Urinary tract infection Tachycardia on Lopressor twice a day  Plan  Started Geodon IM Increased Levemir to 20 twice a day I M Critical care time 33 minutes  Carron Curie M.D on 03/23/2014 at 11:11 AM

## 2014-03-24 NOTE — Progress Notes (Signed)
[facility]                                                                                              Progress note     Patient Demographics  [name], is a 48 y.o. female  WJX:[id number]  OZH:[id number]  DOB - 02-Apr-1966  Admit date - 03/04/2014  Admitting Physician Carron Curie, MD  Outpatient Primary MD for the patient is No PCP Per Patient  LOS - 20   No chief complaint on file.          Subjective:   [name]  Denied any pain. She stated she wanted to eat and followed most commands.    Objective:   Vital signs  Tmax: 98.20F Heart rate 98-116 Respiratory rate 14-20 Blood pressure 144/90-168/88 Pulse Ox: 99- 100% I/O: 2203/incontinent of urine but UOP adequate per flow sheet, wet pad q 2h, 1 stool Exam HEENT: [place]/AT,PERRLA EOMI bilaterally. MMM, + gag reflex NECK: Supple, No JVD or LAD. Tracheostomy in place and secure LUNGS: Symmetrical Chest wall movement, Good air movement bilaterally CV: Normal S1, S2, No m/c/r; No Parasternal Heave ABD: Normactive BS x 4, S/NT/ND; No organomegaly appreciated, rebound, guarding or rigidity. +PEG tube No EXT: No Cyanosis, Clubbing or edema, pulses 2+,  No new rashes or bruises, restraints in place and loose fitting Neuro: RLE intentional tremor; SMAEs, motor and sensory intact and CN grossly intact Psych: flat affect and guarded  Data Review   CBC  Recent Labs Lab 03/19/14 0500 03/23/14 0500  WBC 6.2 4.8  HGB 8.7* 9.4*  HCT 28.3* 30.6*  PLT PLATELET CLUMPS NOTED ON SMEAR, COUNT APPEARS ADEQUATE 376  MCV 63.0* 63.2*  MCH 19.4* 19.4*  MCHC 30.7 30.7  RDW 16.0* 16.0*    Chemistries   Recent Labs Lab 03/19/14 0500 03/23/14 0500  NA 137 133*  K 4.8 5.2  CL 95* 92*  CO2 28 28  GLUCOSE 217* 539*  BUN 17 25*  CREATININE 0.58 0.65  CALCIUM 9.1 9.2    ------------------------------------------------------------------------------------------------------------------ CrCl is unknown because both a height and weight (above a minimum accepted value) are required for this calculation. ------------------------------------------------------------------------------------------------------------------ No results found for this basename: HGBA1C,  in the last 72 hours ------------------------------------------------------------------------------------------------------------------ No results found for this basename: CHOL, HDL, LDLCALC, TRIG, CHOLHDL, LDLDIRECT,  in the last 72 hours ------------------------------------------------------------------------------------------------------------------ No results found for this basename: TSH, T4TOTAL, FREET3, T3FREE, THYROIDAB,  in the last 72 hours ------------------------------------------------------------------------------------------------------------------ No results found for this basename: VITAMINB12, FOLATE, FERRITIN, TIBC, IRON, RETICCTPCT,  in the last 72 hours  Coagulation profile No results found for this basename: INR, PROTIME,  in the last 168 hours  No results found for this basename: DDIMER,  in the last 72 hours  Cardiac Enzymes No results found for this basename: CK, CKMB, TROPONINI, MYOGLOBIN,  in the last 168 hours ------------------------------------------------------------------------------------------------------------------ No components found with this basename: POCBNP,   Micro Results Recent Results (from the past 240 hour(s))  GRAM STAIN     Status: None   Collection Time    03/16/14 10:57 AM      Result Value  Ref Range Status   Specimen Description TRACHEAL ASPIRATE   Final   Special Requests Normal   Final   Gram Stain     Final   Value: ABUNDANT WBC PRESENT,BOTH PMN AND MONONUCLEAR     FEW GRAM POSITIVE COCCI IN PAIRS     RARE GRAM NEGATIVE RODS   Report Status  03/16/2014 FINAL   Final  CULTURE, RESPIRATORY (NON-EXPECTORATED)     Status: None   Collection Time    03/16/14 10:58 AM      Result Value Ref Range Status   Specimen Description TRACHEAL ASPIRATE   Final   Special Requests Normal   Final   Gram Stain     Final   Value: ABUNDANT WBC PRESENT,BOTH PMN AND MONONUCLEAR     RARE SQUAMOUS EPITHELIAL CELLS PRESENT     FEW GRAM POSITIVE COCCI     IN PAIRS RARE GRAM NEGATIVE RODS     Performed at [facility]   Culture     Final   Value: Non-Pathogenic Oropharyngeal-type Flora Isolated.     Performed at [facility]   Report Status 03/18/2014 FINAL   Final    Assessment & Plan   Acute Respiratory Failure Ventilator Dependent status post tracheostomy: -continue with ATC 28% down-sized to #4 cuffless tolerating PMV  Acute Encephalopathy/Acute Delirium secondary to hypoglycemia/Mood Disorder: -titrate seroquel to goal and tolerance: add seroquel  qam to qhs dose and continue geodon  BID for now with hopes of being able manage with just seroquel; d/c prn clonazepam and continue prn IV ativan; would prefer that pt does not use benzos given psych, substance abuse, and polypharmacy history -continue to monitor, fish oil, melantonin, and methadone use  Klebsiella Pna/UTI: -completing cipro course on 09/10, can d/c PICC line   Prolonged QTc: -monitor and avoid haldol; d/c zofran  Diabetes mellitus II, uncontrolled and very labile: -titarte levemir to goal and tolerance, increased to 25U BID vs 20UBID given peak BG >500 overnight; -increased novolog sliding scale to low dose vs very low dose -question if pt is Type I vs Type II -add lisinopril  daily for renal protection  Malignant Hypertension/Tachycardia: -increased metoprolol from 12.5mg  BID to  BID -add lisinopril above both with holding parameters (check BMP 9/10 to monitor for hyperkalemia given K+ 9.7 on 09/07)  Protein calorie malnutrition on tube  feedings per PEG tube/Anemia of chronic disease/Generalized weakness complicated by encephalopath: -ST/PT/OT: will start diet as pt was able to eat with direction and full assist w/o decompensation; d/w RD -PT/OT to evaluate and treat and plan for transition to SNF -prn CBC for anemia and monitor for clinical signs of acute bleeding or decompensation for worsening anemia  Disp: d/w care mgmt, complete Level II if not done and prepare for transition to SNF  [facility] Sharyon Medicus M.D on 03/24/2014 at 3:16 PM

## 2014-03-25 NOTE — Progress Notes (Signed)
   Name: [name] MRN: [id number] DOB: 1966/06/23    ADMISSION DATE:  03/04/2014 CONSULTATION DATE: 8/20  REFERRING MD :  [facility]  CHIEF COMPLAINT:  AMS/Vent dependent  BRIEF DESCRIPTION: 48 yo female admitted to [facility] with hypoglycemia and encephalopathy.  She failed extubation and required tracheostomy eventually.  She was transferred to [facility] for rehab.  SIGNIFICANT EVENTS / STUDIES:  8/15 CT head: No acute intracranial abnormality. Mild sinusitis  8/15 EEG c/w mild encephalopathy, no seizures  8/16 ct head>>>neg acute, sinusitis Intubated 02/20/14  Extubated 02/21/14  Reintubated 8/8 for stridor. Difficult intubation. 6.5 tube.  02/22/14: Sedated on vent  02/23/14: Overnight agitated and attempting to self extubated. Now deeply sedated with fent and diprivan gtt. Fever curve coming down  02/24/14: still very agitated on WUA; propofil changed to precedex  02/25/14: Neuro suspects hypoglycemia related encephalopathy. Want to rule out stroke. Mom indicated full code and SNF placement desire  02/26/14: climbing CK on diprivan to 1425; approx d5 of diprivan. Agitation continues. Neuro want MRI when feasible.  8/14 Trach  8/17- off fent and versed drips, precedex remains  8/19- followed commands!!!! 8/19 transferred to [facility] 8/27 for 16 hrs of PS  LINES / TUBES: PEG 8/19 >>> Trach 8/14 >>>  SUBJECTIVE:  Tolerating trach collar  VITAL SIGNS:   Vital signs reviewed.  PHYSICAL EXAMINATION: General: no distress Neuro: sleepy HEENT: trach site clean Cardiovascular: RRR, no murmur  Lungs: scattered rhonchi Abdomen: peg in place Musculoskeletal: no edema  Skin: no rashes   Recent Labs Lab 03/19/14 0500 03/23/14 0500  NA 137 133*  K 4.8 5.2  CL 95* 92*  CO2 28 28  BUN 17 25*  CREATININE 0.58 0.65  GLUCOSE 217* 539*    Recent Labs Lab 03/19/14 0500 03/23/14 0500  HGB 8.7* 9.4*  HCT 28.3* 30.6*  WBC 6.2 4.8  PLT PLATELET CLUMPS NOTED ON SMEAR, COUNT APPEARS ADEQUATE 376     ASSESSMENT   Acute respiratory failure 2nd to hypoglycemia, aspiration pneumonia. 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 Plan: Trach collar as tolerated Would not decannulate  PCCM will sign off.  Please call if additional help needed.  Coralyn Helling, MD [facility] Pulmonary/Critical Care 03/25/2014, 11:22 AM Pager:  [phone] After 3pm call: [phone]

## 2014-03-26 LAB — BASIC METABOLIC PANEL
ANION GAP: 14 (ref 5–15)
BUN: 25 mg/dL — AB (ref 6–23)
CO2: 25 mEq/L (ref 19–32)
Calcium: 8.7 mg/dL (ref 8.4–10.5)
Chloride: 89 mEq/L — ABNORMAL LOW (ref 96–112)
Creatinine, Ser: 0.66 mg/dL (ref 0.50–1.10)
Glucose, Bld: 485 mg/dL — ABNORMAL HIGH (ref 70–99)
Potassium: 6 mEq/L — ABNORMAL HIGH (ref 3.7–5.3)
Sodium: 128 mEq/L — ABNORMAL LOW (ref 137–147)

## 2014-03-26 LAB — PHOSPHORUS: Phosphorus: 4 mg/dL (ref 2.3–4.6)

## 2014-03-26 LAB — MAGNESIUM: Magnesium: 1.9 mg/dL (ref 1.5–2.5)

## 2014-03-26 NOTE — Progress Notes (Signed)
[facility]                                                                                              Progress note     Patient Demographics  [name], is a 48 y.o. female  ZOX:[id number]  [id number]:[id number]  DOB - 27-Apr-1966  Admit date - 03/04/2014  Admitting Physician Carron Curie, MD  Outpatient Primary MD for the patient is No PCP Per Patient  LOS - 22  CC: VDRF Acute Encephalopathy UTI PNA   Subjective:   [name]  Denied any pain. She states she wants to eat but just isnt hungry. Combative overnight per nursing.  Objective:   Vital signs  Tmax:    97.92F Heart rate   78-102 Respiratory rate  14-20 Blood pressure 111/68-130/72 Pulse Ox:   98- 100% I/O: 1782/incontinent of urine but UOP adequate per flow sheet BM: 0 BG: 113-338  Exam HEENT: [place]/AT,PERRLA EOMI bilaterally. MMM, + gag reflex NECK: Supple, No JVD or LAD. Tracheostomy in place and secure LUNGS: Symmetrical Chest wall movement, Good air movement bilaterally CV: Normal S1, S2, No m/c/r; No Parasternal Heave ABD: Normactive BS x 4, S/NT/ND; No organomegaly appreciated, rebound, guarding or rigidity. +PEG tube No EXT: No Cyanosis, Clubbing or edema, pulses 2+,  No new rashes or bruises, restraints in place and loose fitting Neuro: RLE intentional tremor; SMAEs, motor and sensory intact and CN grossly intact Psych: flat affect and guarded  Data Review   CBC  Recent Labs Lab 03/23/14 0500  WBC 4.8  HGB 9.4*  HCT 30.6*  PLT 376  MCV 63.2*  MCH 19.4*  MCHC 30.7  RDW 16.0*    Chemistries   Recent Labs Lab 03/23/14 0500 03/26/14 0555  NA 133* 128*  K 5.2 6.0*  CL 92* 89*  CO2 28 25  GLUCOSE 539* 485*  BUN 25* 25*  CREATININE 0.65 0.66  CALCIUM 9.2 8.7  MG  --  1.9   ------------------------------------------------------------------------------------------------------------------ CrCl is  unknown because both a height and weight (above a minimum accepted value) are required for this calculation. ------------------------------------------------------------------------------------------------------------------ No results found for this basename: HGBA1C,  in the last 72 hours ------------------------------------------------------------------------------------------------------------------ No results found for this basename: CHOL, HDL, LDLCALC, TRIG, CHOLHDL, LDLDIRECT,  in the last 72 hours ------------------------------------------------------------------------------------------------------------------ No results found for this basename: TSH, T4TOTAL, FREET3, T3FREE, THYROIDAB,  in the last 72 hours ------------------------------------------------------------------------------------------------------------------ No results found for this basename: VITAMINB12, FOLATE, FERRITIN, TIBC, IRON, RETICCTPCT,  in the last 72 hours  Coagulation profile No results found for this basename: INR, PROTIME,  in the last 168 hours  No results found for this basename: DDIMER,  in the last 72 hours  Cardiac Enzymes No results found for this basename: CK, CKMB, TROPONINI, MYOGLOBIN,  in the last 168 hours ------------------------------------------------------------------------------------------------------------------ No components found with this basename: POCBNP,   Micro Results No results found for this or any previous visit (from the past 240 hour(s)).  Assessment & Plan   Acute Respiratory Failure Ventilator Dependent status post tracheostomy: -continue with ATC 28% down-sized to #4 cuffless tolerating PMV  Acute Encephalopathy/Acute Delirium  secondary to hypoglycemia/Schizophrenia: -titrate seroquel to goal and tolerance: increased to  qam and cont  qhs dose. D/c IM geodon and started Geodone  PO BID and check CBC in am given risk of leukopenia; added scheduled Klonipin  0.5mg  BID but had to decrease to 0.25mg  BID 2/2 hypersomolence; reassess over the next 1-2 days -would prefer that pt does not use benzos given psych, substance abuse, and polypharmacy history but given QTc prolongation limited agents available -continue to monitor, fish oil, melantonin, and methadone use  Klebsiella Pna/UTI: -resolved  Prolonged QTc: -monitor and avoid haldol; d/c zofran  Diabetes mellitus II, uncontrolled and very labile: improved today, no acute changes -titarte levemir to goal and tolerance,increased from 25U BID to 35U BID -cont novolog sliding scale high dose -question if pt is Type I vs Type II -continue lisinopril  daily for renal protection; hold today given hyperkalemia  Hyperkalemia: -Kayexalate 30g per NGT q 4h x 4 doses and recheck level in am   Malignant Hypertension/Tachycardia: -improved with increased metoprolol at  BID, continue to monitor -lisinopril above both with holding parameters   Protein calorie malnutrition on tube feedings per PEG tube/Anemia of chronic disease/Generalized weakness complicated by encephalopath: -ST/PT/OT: will start diet as pt was able to eat with direction and full assist w/o decompensation; d/w RD (only at 10% today); may need to resume 24h TF -PT/OT to evaluate and treat and plan for transition to SNF -prn CBC for anemia and monitor for clinical signs of acute bleeding or decompensation for worsening anemia  Disp: d/w care mgmt, complete Level II if not done and prepare for transition to SNF  Berton Lan M.D on 03/26/2014 at 11:32 PM

## 2014-03-26 NOTE — Progress Notes (Signed)
[facility]                                                                                              Progress note     Patient Demographics  [name], is a 48 y.o. female  UJW:[id number]  FAO:[id number]  DOB - 1965-10-12  Admit date - 03/04/2014  Admitting Physician Carron Curie, MD  Outpatient Primary MD for the patient is No PCP Per Patient  LOS - 22  CC: VDRF Acute Encephalopathy UTI PNA   Subjective:   [name]  Denied any pain. She states she wants to eat but just isnt hungry. Combative overnight per nursing.  Objective:   Vital signs  Tmax:    98.3F Heart rate   90-132 Respiratory rate  14-20 Blood pressure 115/67-152/91 Pulse Ox:   94- 99% I/O: 2950/incontinent of urine but UOP adequate per flow sheet BM: 0 BG: 95-194 with one outlier of 346  Exam HEENT: [place]/AT,PERRLA EOMI bilaterally. MMM, + gag reflex NECK: Supple, No JVD or LAD. Tracheostomy in place and secure LUNGS: Symmetrical Chest wall movement, Good air movement bilaterally CV: Normal S1, S2, No m/c/r; No Parasternal Heave ABD: Normactive BS x 4, S/NT/ND; No organomegaly appreciated, rebound, guarding or rigidity. +PEG tube No EXT: No Cyanosis, Clubbing or edema, pulses 2+,  No new rashes or bruises, restraints in place and loose fitting Neuro: RLE intentional tremor; SMAEs, motor and sensory intact and CN grossly intact Psych: flat affect and guarded  Data Review   CBC  Recent Labs Lab 03/19/14 0500 03/23/14 0500  WBC 6.2 4.8  HGB 8.7* 9.4*  HCT 28.3* 30.6*  PLT PLATELET CLUMPS NOTED ON SMEAR, COUNT APPEARS ADEQUATE 376  MCV 63.0* 63.2*  MCH 19.4* 19.4*  MCHC 30.7 30.7  RDW 16.0* 16.0*    Chemistries   Recent Labs Lab 03/19/14 0500 03/23/14 0500  NA 137 133*  K 4.8 5.2  CL 95* 92*  CO2 28 28  GLUCOSE 217* 539*  BUN 17 25*  CREATININE 0.58 0.65  CALCIUM 9.1 9.2    ------------------------------------------------------------------------------------------------------------------ CrCl is unknown because both a height and weight (above a minimum accepted value) are required for this calculation. ------------------------------------------------------------------------------------------------------------------ No results found for this basename: HGBA1C,  in the last 72 hours ------------------------------------------------------------------------------------------------------------------ No results found for this basename: CHOL, HDL, LDLCALC, TRIG, CHOLHDL, LDLDIRECT,  in the last 72 hours ------------------------------------------------------------------------------------------------------------------ No results found for this basename: TSH, T4TOTAL, FREET3, T3FREE, THYROIDAB,  in the last 72 hours ------------------------------------------------------------------------------------------------------------------ No results found for this basename: VITAMINB12, FOLATE, FERRITIN, TIBC, IRON, RETICCTPCT,  in the last 72 hours  Coagulation profile No results found for this basename: INR, PROTIME,  in the last 168 hours  No results found for this basename: DDIMER,  in the last 72 hours  Cardiac Enzymes No results found for this basename: CK, CKMB, TROPONINI, MYOGLOBIN,  in the last 168 hours ------------------------------------------------------------------------------------------------------------------ No components found with this basename: POCBNP,   Micro Results Recent Results (from the past 240 hour(s))  GRAM STAIN     Status: None   Collection Time    03/16/14 10:57 AM  Result Value Ref Range Status   Specimen Description TRACHEAL ASPIRATE   Final   Special Requests Normal   Final   Gram Stain     Final   Value: ABUNDANT WBC PRESENT,BOTH PMN AND MONONUCLEAR     FEW GRAM POSITIVE COCCI IN PAIRS     RARE GRAM NEGATIVE RODS   Report Status  03/16/2014 FINAL   Final  CULTURE, RESPIRATORY (NON-EXPECTORATED)     Status: None   Collection Time    03/16/14 10:58 AM      Result Value Ref Range Status   Specimen Description TRACHEAL ASPIRATE   Final   Special Requests Normal   Final   Gram Stain     Final   Value: ABUNDANT WBC PRESENT,BOTH PMN AND MONONUCLEAR     RARE SQUAMOUS EPITHELIAL CELLS PRESENT     FEW GRAM POSITIVE COCCI     IN PAIRS RARE GRAM NEGATIVE RODS     Performed at [facility]   Culture     Final   Value: Non-Pathogenic Oropharyngeal-type Flora Isolated.     Performed at [facility]   Report Status 03/18/2014 FINAL   Final    Assessment & Plan   Acute Respiratory Failure Ventilator Dependent status post tracheostomy: -continue with ATC 28% down-sized to #4 cuffless tolerating PMV  Acute Encephalopathy/Acute Delirium secondary to hypoglycemia/Schizophrenia: -titrate seroquel to goal and tolerance: increased to --  qam and cont  qhs dose with goals to d/c IM geodon; Start  PO BID and check CBC in am given risk of leukopenia; added scheduled Klonipin 0.5mg  BID given continue and frequent IV ativan needed, reassess over the next 1-2 days -would prefer that pt does not use benzos given psych, substance abuse, and polypharmacy history but given QTc prolongation limited agents available -continue to monitor, fish oil, melantonin, and methadone use  Klebsiella Pna/UTI: -completing cipro course on 09/10, d/c PICC line   Prolonged QTc: -monitor and avoid haldol; d/c zofran  Diabetes mellitus II, uncontrolled and very labile: improved today, no acute changes -titarte levemir to goal and tolerance,continue 25U BIDt; -increased novolog sliding scale high dose -question if pt is Type I vs Type II -continue lisinopril  daily for renal protection  Malignant Hypertension/Tachycardia: -increased metoprolol from 12.5mg  BID to  BID -add lisinopril above both with holding  parameters (check BMP 9/10 to monitor for hyperkalemia given K+ 9.7 on 09/07)  Protein calorie malnutrition on tube feedings per PEG tube/Anemia of chronic disease/Generalized weakness complicated by encephalopath: -ST/PT/OT: will start diet as pt was able to eat with direction and full assist w/o decompensation; d/w RD -PT/OT to evaluate and treat and plan for transition to SNF -prn CBC for anemia and monitor for clinical signs of acute bleeding or decompensation for worsening anemia  Disp: d/w care mgmt, complete Level II if not done and prepare for transition to SNF  [facility] Sharyon Medicus M.D on 03/26/2014 at 1:03 AM

## 2014-03-27 LAB — BASIC METABOLIC PANEL
Anion gap: 15 (ref 5–15)
BUN: 34 mg/dL — AB (ref 6–23)
CO2: 30 meq/L (ref 19–32)
CREATININE: 0.73 mg/dL (ref 0.50–1.10)
Calcium: 9.2 mg/dL (ref 8.4–10.5)
Chloride: 94 mEq/L — ABNORMAL LOW (ref 96–112)
GFR calc Af Amer: >90 mL/min (ref >90)
GFR calc non Af Amer: >90 mL/min (ref >90)
Glucose, Bld: 122 mg/dL — ABNORMAL HIGH (ref 70–99)
Potassium: 4.1 mEq/L (ref 3.7–5.3)
Sodium: 139 mEq/L (ref 137–147)

## 2014-03-27 NOTE — Progress Notes (Signed)
[facility]                                                                                              Progress note     Patient Demographics  [name], is a 48 y.o. female  WUJ:[id number]  [id number]  DOB - 1965-08-25  Admit date - 03/04/2014  Admitting Physician Carron Curie, MD  Outpatient Primary MD for the patient is No PCP Per Patient  LOS - 23  CC: VDRF Acute Encephalopathy UTI PNA   Subjective:   [name]  Resolution of combativeness, more awake today.  Objective:   Vital signs  Tmax:    98.104F Heart rate   78-113 Respiratory rate  15-17 Blood pressure 105/61-130/87 Pulse Ox:    100% I/O: 1002/incontinent of urine but UOP adequate per flow sheet BM: 12 s/p kayxelate BG: 187-207  Exam HEENT: [place]/AT,PERRLA EOMI bilaterally. MMM, + gag reflex NECK: Supple, No JVD or LAD. Tracheostomy in place and secure LUNGS: Symmetrical Chest wall movement, Good air movement bilaterally CV: Normal S1, S2, No m/c/r; No Parasternal Heave ABD: Normactive BS x 4, S/NT/ND; No organomegaly appreciated, rebound, guarding or rigidity. +PEG tube No EXT: No Cyanosis, Clubbing or edema, pulses 2+,  No new rashes or bruises, restraints in place and loose fitting Neuro: RLE intentional tremor; SMAEs, motor and sensory intact and CN grossly intact Psych: flat affect and guarded  Data Review   CBC  Recent Labs Lab 03/23/14 0500  WBC 4.8  HGB 9.4*  HCT 30.6*  PLT 376  MCV 63.2*  MCH 19.4*  MCHC 30.7  RDW 16.0*    Chemistries   Recent Labs Lab 03/23/14 0500 03/26/14 0555 03/27/14 0750  NA 133* 128* 139  K 5.2 6.0* 4.1  CL 92* 89* 94*  CO2 GLUCOSE 539* 485* 122*  BUN 25* 25* 34*  CREATININE 0.65 0.66 0.73  CALCIUM 9.2 8.7 9.2  MG  --  1.9  --     ------------------------------------------------------------------------------------------------------------------ CrCl is unknown because both a height and weight (above a minimum accepted value) are required for this calculation. ------------------------------------------------------------------------------------------------------------------ No results found for this basename: HGBA1C,  in the last 72 hours ------------------------------------------------------------------------------------------------------------------ No results found for this basename: CHOL, HDL, LDLCALC, TRIG, CHOLHDL, LDLDIRECT,  in the last 72 hours ------------------------------------------------------------------------------------------------------------------ No results found for this basename: TSH, T4TOTAL, FREET3, T3FREE, THYROIDAB,  in the last 72 hours ------------------------------------------------------------------------------------------------------------------ No results found for this basename: VITAMINB12, FOLATE, FERRITIN, TIBC, IRON, RETICCTPCT,  in the last 72 hours  Coagulation profile No results found for this basename: INR, PROTIME,  in the last 168 hours  No results found for this basename: DDIMER,  in the last 72 hours  Cardiac Enzymes No results found for this basename: CK, CKMB, TROPONINI, MYOGLOBIN,  in the last 168 hours ------------------------------------------------------------------------------------------------------------------ No components found with this basename: POCBNP,   Micro Results No results found for this or any previous visit (from the past 240 hour(s)).  Assessment & Plan   Acute Respiratory Failure Ventilator Dependent status post tracheostomy: -continue with ATC 28% down-sized to #4 cuffless tolerating PMV  Acute Encephalopathy/Acute Delirium secondary to hypoglycemia/Schizophrenia: -titrate seroquel to goal and tolerance: improved with Seroquel increased   qam and cont  qhs dose. Cont Geodone  PO BID and check CBC in am given risk of leukopenia; decreased Klonipin 0.5mg  BID to 0.25mg  BID 2/2 hypersomolence; reassess over the next 1-2 days -would prefer that pt does not use benzos given psych, substance abuse, and polypharmacy history but given QTc prolongation limited agents available -continue to monitor, fish oil, melantonin, and methadone use  Prolonged QTc: -monitor and avoid haldol; d/c zofran  Diabetes mellitus II, uncontrolled and very labile: improved today, no acute changes -titarte levemir to goal and tolerance,increased from 25U BID to 35U BID, improved, may need to increase to 40U BID in am, reassess trend in am -cont novolog sliding scale high dose -question if pt is Type I vs Type II -continue lisinopril  daily for renal protection; hold today given hyperkalemia  Hyperkalemia:resolved -recheck CMP on 9/14  Azotemia, acute: -increase FW per RD after discussion, reassess with lab on 9/14; good UOP  Malignant Hypertension/Tachycardia: -improved with increased metoprolol at  BID, continue to monitor -lisinopril above both with holding parameters   Protein calorie malnutrition on tube feedings per PEG tube/Anemia of chronic disease/Generalized weakness complicated by encephalopath: -ST/PT/OT:resume TF continously for now as pt is not able to intake her daily needs at this time  -PT/OT to evaluate and treat and plan for transition to SNF -prn CBC for anemia and monitor for clinical signs of acute bleeding or decompensation for worsening anemia  Disp: d/w care mgmt, complete Level II if not done and prepare for transition to SNF  [facility] Sharyon Medicus M.D on 03/27/2014 at 5:29 PM

## 2014-03-28 NOTE — Progress Notes (Signed)
[facility]                                                                                              Progress note        Patient Demographics  [name], is a 48 y.o. female  [id number]  [id number]  DOB - 08-02-1965  Admit date - 03/04/2014  Admitting Physician Carron Curie, MD  Outpatient Primary MD for the patient is No PCP Per Patient  LOS - 24  CC: VDRF Acute Encephalopathy UTI PNA   Subjective:   [name]  Is cooperative and denies any complaints today.  Objective:   Vital signs  Tmax:    98.59F Heart rate   72-100 Respiratory rate  12-18 Blood pressure 109/61-150/90 Pulse Ox:    97-100% on 28% ATC; tolerated PMV I/O:    1360/incontinent of urine but UOP adequate per flow sheet BM:    0 BG:    61-123  Exam HEENT: [place]/AT,PERRLA EOMI bilaterally. MMM, + gag reflex NECK: Supple, No JVD or LAD. Tracheostomy in place and secure #4 shiley LUNGS: Symmetrical Chest wall movement, Good air movement bilaterally CV: Normal S1, S2, No m/c/r; No Parasternal Heave ABD: Normactive BS x 4, S/NT/ND; No organomegaly appreciated, rebound, guarding or rigidity. +PEG tube No EXT: No Cyanosis, Clubbing or edema, pulses 2+,  No new rashes or bruises, restraints in place and loose fitting Neuro: RLE intentional tremor; SMAEs, motor and sensory intact and CN grossly intact Psych: flat affect and guarded  Data Review   CBC  Recent Labs Lab 03/23/14 0500  WBC 4.8  HGB 9.4*  HCT 30.6*  PLT 376  MCV 63.2*  MCH 19.4*  MCHC 30.7  RDW 16.0*    Chemistries   Recent Labs Lab 03/23/14 0500 03/26/14 0555 03/27/14 0750  NA 133* 128* 139  K 5.2 6.0* 4.1  CL 92* 89* 94*  CO2 GLUCOSE 539* 485* 122*  BUN 25* 25* 34*  CREATININE 0.65 0.66 0.73  CALCIUM 9.2 8.7 9.2  MG  --  1.9  --     ------------------------------------------------------------------------------------------------------------------ CrCl is unknown because both a height and weight (above a minimum accepted value) are required for this calculation. ------------------------------------------------------------------------------------------------------------------ No results found for this basename: HGBA1C,  in the last 72 hours ------------------------------------------------------------------------------------------------------------------ No results found for this basename: CHOL, HDL, LDLCALC, TRIG, CHOLHDL, LDLDIRECT,  in the last 72 hours ------------------------------------------------------------------------------------------------------------------ No results found for this basename: TSH, T4TOTAL, FREET3, T3FREE, THYROIDAB,  in the last 72 hours ------------------------------------------------------------------------------------------------------------------ No results found for this basename: VITAMINB12, FOLATE, FERRITIN, TIBC, IRON, RETICCTPCT,  in the last 72 hours  Coagulation profile No results found for this basename: INR, PROTIME,  in the last 168 hours  No results found for this basename: DDIMER,  in the last 72 hours  Cardiac Enzymes No results found for this basename: CK, CKMB, TROPONINI, MYOGLOBIN,  in the last 168 hours ------------------------------------------------------------------------------------------------------------------ No components found with this basename: POCBNP,   Micro Results No results found for this or any previous visit (from the past 240 hour(s)).  Assessment & Plan  Acute Respiratory Failure Ventilator Dependent status post tracheostomy: -continue with ATC 28% down-sized to #4 cuffless tolerating PMV  Acute Encephalopathy/Acute Delirium secondary to hypoglycemia/Schizophrenia: -titrate seroquel to goal and tolerance: improved with Seroquel increased   qam and cont  qhs dose. Cont Geodone  PO BID and check CBC 9/14 given risk of leukopenia; cont Klonipin 0.25mg  BID -Day #1 of restraints release  Prolonged QTc: -monitor and avoid haldol; d/c zofran  Diabetes mellitus II, uncontrolled and very labile: improved today but some episodes of hypoglycemia -changed levemir from 35U BID to 35U qam and 25U qhs with low dose SSI -cont novolog sliding scalebut change from high dose to lose dose -question if pt is Type I vs Type II -continue lisinopril  daily for renal protection;  Hyperkalemia:resolved -recheck CMP on 9/14  Azotemia, acute: -increase FW per RD after discussion, reassess with lab on 9/14; good UOP  Malignant Hypertension/Tachycardia: -improved with increased metoprolol at  BID, continue to monitor -lisinopril above both with holding parameters   Protein calorie malnutrition on tube feedings per PEG tube/Anemia of chronic disease/Generalized weakness complicated by encephalopath: -ST/PT/OT:resume TF continously for now as pt is not able to intake her daily needs at this time  -PT/OT to evaluate and treat and plan for transition to SNF -prn CBC for anemia and monitor for clinical signs of acute bleeding or decompensation for worsening anemia  Disp: d/w care mgmt, complete Level II if not done and prepare for transition to SNF  Berton Lan M.D on 03/28/2014 at 3:27 PM

## 2014-03-29 NOTE — Progress Notes (Signed)
[facility]                                                                                              Progress note        Patient Demographics  [name], is a 48 y.o. female  [id number]  [id number]  DOB - Dec 14, 1965  Admit date - 03/04/2014  Admitting Physician Carron Curie, MD  Outpatient Primary MD for the patient is No PCP Per Patient  LOS - 25  CC: VDRF Acute Encephalopathy UTI PNA   Subjective:   [name]  Is cooperative and denies any complaints today. States she is "hungry" and wants to eat. Her significant other is at the bedside and he states he gave her mashed potatoes and baked beans and she ate everything w/o choking.  Objective:   Vital signs  Tmax:    98.57F Heart rate   77-91 Respiratory rate  15-18 Blood pressure 110/74-128/70 Pulse Ox:    95-100% on 28% ATC; tolerated PMV I/O:    1740/incontinent of urine but UOP adequate per flow sheet BM:    1 BG:    90-110  Exam HEENT: [place]/AT,PERRLA EOMI bilaterally. MMM, + gag reflex NECK: Supple, No JVD or LAD. Tracheostomy in place and secure #4 shiley LUNGS: Symmetrical Chest wall movement, Good air movement bilaterally CV: Normal S1, S2, No m/c/r; No Parasternal Heave ABD: Normactive BS x 4, S/NT/ND; No organomegaly appreciated, rebound, guarding or rigidity. +PEG tube No EXT: No Cyanosis, Clubbing or edema, pulses 2+,  No new rashes or bruises, restraints in place and loose fitting Neuro: RLE intentional tremor; SMAEs, motor and sensory intact and CN grossly intact Psych: flat affect and guarded  Data Review   CBC  Recent Labs Lab 03/23/14 0500  WBC 4.8  HGB 9.4*  HCT 30.6*  PLT 376  MCV 63.2*  MCH 19.4*  MCHC 30.7  RDW 16.0*    Chemistries   Recent Labs Lab 03/23/14 0500 03/26/14 0555 03/27/14 0750  NA 133* 128* 139  K 5.2 6.0* 4.1  CL 92* 89* 94*  CO2 GLUCOSE 539* 485*  122*  BUN 25* 25* 34*  CREATININE 0.65 0.66 0.73  CALCIUM 9.2 8.7 9.2  MG  --  1.9  --    ------------------------------------------------------------------------------------------------------------------ CrCl is unknown because both a height and weight (above a minimum accepted value) are required for this calculation. ------------------------------------------------------------------------------------------------------------------ No results found for this basename: HGBA1C,  in the last 72 hours ------------------------------------------------------------------------------------------------------------------ No results found for this basename: CHOL, HDL, LDLCALC, TRIG, CHOLHDL, LDLDIRECT,  in the last 72 hours ------------------------------------------------------------------------------------------------------------------ No results found for this basename: TSH, T4TOTAL, FREET3, T3FREE, THYROIDAB,  in the last 72 hours ------------------------------------------------------------------------------------------------------------------ No results found for this basename: VITAMINB12, FOLATE, FERRITIN, TIBC, IRON, RETICCTPCT,  in the last 72 hours  Coagulation profile No results found for this basename: INR, PROTIME,  in the last 168 hours  No results found for this basename: DDIMER,  in the last 72 hours  Cardiac Enzymes No results found for this basename: CK, CKMB, TROPONINI, MYOGLOBIN,  in the last 168 hours ------------------------------------------------------------------------------------------------------------------  No components found with this basename: POCBNP,   Micro Results No results found for this or any previous visit (from the past 240 hour(s)).  Assessment & Plan   Acute Respiratory Failure Ventilator Dependent status post tracheostomy: -continue with ATC 28% down-sized to #4 cuffless tolerating PMV  Acute Encephalopathy/Acute Delirium secondary to  hypoglycemia/Schizophrenia: -titrate seroquel to goal and tolerance: improved with Seroquel increased  qam and cont  qhs dose. Cont Geodone  PO BID and check CBC 9/14 given risk of leukopenia; cont Klonipin 0.25mg  BID -Day #2 of restraints release  Prolonged QTc: -monitor and avoid haldol; d/c zofran  Diabetes mellitus II, uncontrolled and very labile: improved today but some episodes of hypoglycemia -changed levemir 35U qam and 25U qhs with low dose SSI -cont novolog sliding scalebut change from high dose to lose dose -question if pt is Type I vs Type II -continue lisinopril  daily for renal protection;  Hyperkalemia:resolved -recheck CMP on 9/14  Azotemia, acute: -increase FW per RD after discussion, reassess with lab on 9/14; good UOP  Malignant Hypertension/Tachycardia: -improved with increased metoprolol at  BID, continue to monitor -lisinopril above both with holding parameters   Protein calorie malnutrition on tube feedings per PEG tube/Anemia of chronic disease/Generalized weakness complicated by encephalopathy: -restart PO Diet per RD recommendations; Dys II, NTL with total feeding assist and aspiration precautions -ST/PT/OT:should be able to restart nocturnal TF tomorrow, defer to RD and ST -PT/OT to evaluate and treat and plan for transition to SNF -prn CBC for anemia and monitor for clinical signs of acute bleeding or decompensation for worsening anemia  Disp: d/w care mgmt, complete Level II if not done and prepare for transition to SNF  Berton Lan M.D on 03/29/2014 at 5:24 PM

## 2014-03-30 LAB — COMPREHENSIVE METABOLIC PANEL
ALK PHOS: 63 U/L (ref 39–117)
ALT: 7 U/L (ref 0–35)
AST: 15 U/L (ref 0–37)
Albumin: 3 g/dL — ABNORMAL LOW (ref 3.5–5.2)
Anion gap: 16 — ABNORMAL HIGH (ref 5–15)
BUN: 22 mg/dL (ref 6–23)
CALCIUM: 9.3 mg/dL (ref 8.4–10.5)
CO2: 27 meq/L (ref 19–32)
Chloride: 92 mEq/L — ABNORMAL LOW (ref 96–112)
Creatinine, Ser: 0.63 mg/dL (ref 0.50–1.10)
GFR calc non Af Amer: >90 mL/min (ref >90)
GLUCOSE: 166 mg/dL — AB (ref 70–99)
Potassium: 4.9 mEq/L (ref 3.7–5.3)
SODIUM: 135 meq/L — AB (ref 137–147)
Total Bilirubin: <0.2 mg/dL — ABNORMAL LOW (ref 0.3–1.2)
Total Protein: 7.5 g/dL (ref 6.0–8.3)

## 2014-03-30 LAB — CBC
HEMATOCRIT: 28.5 % — AB (ref 36.0–46.0)
Hemoglobin: 8.6 g/dL — ABNORMAL LOW (ref 12.0–15.0)
MCH: 19.2 pg — ABNORMAL LOW (ref 26.0–34.0)
MCHC: 30.2 g/dL (ref 30.0–36.0)
MCV: 63.6 fL — AB (ref 78.0–100.0)
Platelets: 213 10*3/uL (ref 150–400)
RBC: 4.48 MIL/uL (ref 3.87–5.11)
RDW: 16.5 % — AB (ref 11.5–15.5)
WBC: 4 10*3/uL (ref 4.0–10.5)

## 2014-03-30 LAB — MAGNESIUM: Magnesium: 1.8 mg/dL (ref 1.5–2.5)

## 2014-03-30 LAB — PHOSPHORUS: PHOSPHORUS: 3.7 mg/dL (ref 2.3–4.6)

## 2014-03-30 NOTE — Progress Notes (Addendum)
[facility]                                                                                              Progress note        Patient Demographics  [name], is a 48 y.o. female  ZOX:[id number]  [id number]  DOB - [birth year]  Admit date - 03/04/2014  Admitting Physician Carron Curie, MD  Outpatient Primary MD for the patient is No PCP Per Patient  LOS - 26  CC: VDRF Acute Encephalopathy UTI PNA   Subjective:   [name]  Is cooperative and denies any complaints today. She states she wants to eat and then go home.  Objective:   Vital signs  Tmax:    98.73F Heart rate   85-101 Respiratory rate  14-20 Blood pressure 104/88-130/68 Pulse Ox:    99-100% on 28% ATC; tolerated PMV I/O:    1500/1300 BM:    none recorded BG:     166-279  Exam HEENT: [place]/AT,PERRLA EOMI bilaterally. MMM, + gag reflex NECK: Supple, No JVD or LAD. Tracheostomy in place and secure #4 shiley LUNGS: Symmetrical Chest wall movement, Good air movement bilaterally CV: Normal S1, S2, No m/c/r; No Parasternal Heave ABD: Normactive BS x 4, S/NT/ND; No organomegaly appreciated, rebound, guarding or rigidity. +PEG tube No EXT: No Cyanosis, Clubbing or edema, pulses 2+,  No new rashes or bruises, restraints in place and loose fitting Neuro: RLE intentional tremor; SMAEs, motor and sensory intact and CN grossly intact Psych: flat affect and guarded  Data Review   CBC  Recent Labs Lab 03/30/14 0650  WBC 4.0  HGB 8.6*  HCT 28.5*  PLT 213  MCV 63.6*  MCH 19.2*  MCHC 30.2  RDW 16.5*    Chemistries   Recent Labs Lab 03/26/14 0555 03/27/14 0750 03/30/14 0650  NA 128* 139 135*  K 6.0* 4.1 4.9  CL 89* 94* 92*  CO2 GLUCOSE 485* 122* 166*  BUN 25* 34* 22  CREATININE 0.66 0.73 0.63  CALCIUM 8.7 9.2 9.3  MG 1.9  --  1.8  AST  --   --  15  ALT  --   --  7  ALKPHOS  --   --  63  BILITOT   --   --  <0.2*   ------------------------------------------------------------------------------------------------------------------ CrCl is unknown because both a height and weight (above a minimum accepted value) are required for this calculation. ------------------------------------------------------------------------------------------------------------------ No results found for this basename: HGBA1C,  in the last 72 hours ------------------------------------------------------------------------------------------------------------------ No results found for this basename: CHOL, HDL, LDLCALC, TRIG, CHOLHDL, LDLDIRECT,  in the last 72 hours ------------------------------------------------------------------------------------------------------------------ No results found for this basename: TSH, T4TOTAL, FREET3, T3FREE, THYROIDAB,  in the last 72 hours ------------------------------------------------------------------------------------------------------------------ No results found for this basename: VITAMINB12, FOLATE, FERRITIN, TIBC, IRON, RETICCTPCT,  in the last 72 hours  Coagulation profile No results found for this basename: INR, PROTIME,  in the last 168 hours  No results found for this basename: DDIMER,  in the last 72 hours  Cardiac Enzymes No results found for this basename: CK, CKMB, TROPONINI, MYOGLOBIN,  in the last 168 hours ------------------------------------------------------------------------------------------------------------------ No components found with this basename: POCBNP,   Micro Results No results found for this or any previous visit (from the past 240 hour(s)).  Assessment & Plan   Acute Respiratory Failure Ventilator Dependent status post tracheostomy: -continue with ATC 28% down-sized to #4 cuffless tolerating PMV and start capping trials per protocol  Acute Encephalopathy/Acute Delirium secondary to hypoglycemia/Schizophrenia: -titrate seroquel to goal  and tolerance: improved with Seroquel increased  qam and cont  qhs dose. Cont Geodone  PO BID and check CBC 9/14 given risk of leukopenia, none noted; cont Klonipin 0.25mg  BID -Day #3 of restraints release  Anemia of chronic disease and iron def, microcytic: -start ferrous sulfate  PO BID + vitamin C  PO BID and continue senna-s to prevent constipation -recommend PCP recheck in 3 months and pursue further workup to include oupt endoscopic studies.   -H/H stable, worse with lab draws  Prolonged QTc: -monitor and avoid haldol; d/c zofran  Diabetes mellitus II, uncontrolled and very labile: improved today but some episodes of hypoglycemia -changed levemir 35U qam and 25U qhs with low dose SSI, BG curve improved and no hypoglycemia -cont novolog sliding scalebut change from high dose to lose dose -question if pt is Type I vs Type II -continue lisinopril  daily for renal protection;  Hyperkalemia:resolved -per 9/14 lab check  Azotemia, acute: -resolved with FW increase, monitor renal fx prn  Malignant Hypertension/Tachycardia: -improved with increased metoprolol at  BID, continue to monitor -lisinopril above both with holding parameters   Protein calorie malnutrition on tube feedings per PEG tube/Anemia of chronic disease/Generalized weakness complicated by encephalopathy: -restart PO Diet per RD recommendations; Dys II, NTL with total feeding assist and aspiration precautions and RD resumed nocturnal feedings Glucerna tra 76ml/h x 12h with FWF 40ml q 2h per protocol -ST/PT/OT: ST reassessment -PT/OT to evaluate and treat and plan for transition to SNF -prn CBC for anemia and monitor for clinical signs of acute bleeding or decompensation for worsening anemia  Disp: d/w care mgmt, pending Level II evaluation for transition to SNF  Berton Lan M.D on 03/30/2014 at 6:41 PM

## 2014-03-31 NOTE — Progress Notes (Signed)
[facility]                                                                                              Progress note        Patient Demographics  [name], is a 48 y.o. female  [id number]  [id number]  DOB - 1966/02/19  Admit date - 03/04/2014  Admitting Physician Carron Curie, MD  Outpatient Primary MD for the patient is No PCP Per Patient  LOS - 27  CC: VDRF Acute Encephalopathy UTI PNA   Subjective:   [name]  Is cooperative and denies any complaints today. She states she wants to eat and then go home.  Objective:   Vital signs  Tmax:    97.3 Heart rate   106 Respiratory rate  18 Blood pressure 121/60 Pulse Ox:   97% ATC;  I/O:    2080/??    Exam HEENT: [place]/AT,PERRLA EOMI bilaterally. MMM, + gag reflex NECK: Supple, No JVD or LAD. Tracheostomy in place and secure #4 shiley LUNGS: Symmetrical Chest wall movement, Good air movement bilaterally CV: Normal S1, S2, No m/c/r; No Parasternal Heave ABD: Normactive BS x 4, S/NT/ND; No organomegaly appreciated, rebound, guarding or rigidity. +PEG tube No EXT: No Cyanosis, Clubbing or edema, pulses 2+,  No new rashes or bruises, restraints in place and loose fitting Neuro: RLE intentional tremor; SMAEs, motor and sensory intact and CN grossly intact Psych: flat affect and guarded  Data Review   CBC  Recent Labs Lab 03/30/14 0650  WBC 4.0  HGB 8.6*  HCT 28.5*  PLT 213  MCV 63.6*  MCH 19.2*  MCHC 30.2  RDW 16.5*    Chemistries   Recent Labs Lab 03/26/14 0555 03/27/14 0750 03/30/14 0650  NA 128* 139 135*  K 6.0* 4.1 4.9  CL 89* 94* 92*  CO2 GLUCOSE 485* 122* 166*  BUN 25* 34* 22  CREATININE 0.66 0.73 0.63  CALCIUM 8.7 9.2 9.3  MG 1.9  --  1.8  AST  --   --  15  ALT  --   --  7  ALKPHOS  --   --  63  BILITOT  --   --  <0.2*    ------------------------------------------------------------------------------------------------------------------ CrCl is unknown because both a height and weight (above a minimum accepted value) are required for this calculation. ------------------------------------------------------------------------------------------------------------------ No results found for this basename: HGBA1C,  in the last 72 hours ------------------------------------------------------------------------------------------------------------------ No results found for this basename: CHOL, HDL, LDLCALC, TRIG, CHOLHDL, LDLDIRECT,  in the last 72 hours ------------------------------------------------------------------------------------------------------------------ No results found for this basename: TSH, T4TOTAL, FREET3, T3FREE, THYROIDAB,  in the last 72 hours ------------------------------------------------------------------------------------------------------------------ No results found for this basename: VITAMINB12, FOLATE, FERRITIN, TIBC, IRON, RETICCTPCT,  in the last 72 hours  Coagulation profile No results found for this basename: INR, PROTIME,  in the last 168 hours  No results found for this basename: DDIMER,  in the last 72 hours  Cardiac Enzymes No results found for this basename: CK, CKMB, TROPONINI, MYOGLOBIN,  in the last 168 hours ------------------------------------------------------------------------------------------------------------------ No components found with this basename: POCBNP,  Micro Results No results found for this or any previous visit (from the past 240 hour(s)).  Assessment & Plan   Acute Respiratory Failure Ventilator Dependent status post tracheostomy: -continue with ATC 28% down-sized to #4 cuffless tolerating PMV , will hold capping due to t mental status  Acute Encephalopathy/Acute Delirium secondary to hypoglycemia/Schizophrenia: -titrate seroquel to goal and  tolerance: improved with Seroquel increased  qam and cont  qhs dose. Cont Geodon  PO BID and check CBC 9/14 given risk of leukopenia, none noted; cont Klonipin 0.25mg  BID -Day #3 of restraints release  Anemia of chronic disease and iron def, microcytic: -start ferrous sulfate  PO BID + vitamin C  PO BID and continue senna-s to prevent constipation -recommend PCP recheck in 3 months and pursue further workup to include oupt endoscopic studies.   -H/H stable, worse with lab draws  Prolonged QTc: -monitor and avoid haldol; d/c zofran  Diabetes mellitus II, uncontrolled and very labile: improved today but some episodes of hypoglycemia -changed levemir 35U qam and 25U qhs with low dose SSI, BG curve improved and no hypoglycemia -cont novolog sliding scalebut change from high dose to lose dose -question if pt is Type I vs Type II -continue lisinopril  daily for renal protection;  Hyperkalemia:resolved -per 9/14 lab check  Azotemia, acute: -resolved with FW increase, monitor renal fx prn  Malignant Hypertension/Tachycardia: -improved with increased metoprolol at  BID, continue to monitor -lisinopril above both with holding parameters   Protein calorie malnutrition on tube feedings per PEG tube/Anemia of chronic disease/Generalized weakness complicated by encephalopathy: -restart PO Diet per RD recommendations; Dys II, NTL with total feeding assist and aspiration precautions and RD resumed nocturnal feedings  -ST/PT/OT: ST reassessment -PT/OT to evaluate and treat and plan for transition to SNF -prn CBC for anemia and monitor for clinical signs of acute bleeding or decompensation for worsening anemia  Disp: d/w care mgmt, pending Level II evaluation for transition to SNF  Carron Curie M.D on 03/31/2014 at 12:24 PM

## 2014-04-01 NOTE — Progress Notes (Signed)
[facility]                                                                                              Progress note        Patient Demographics  [name], is a 48 y.o. female  ZOX:[id number]  [id number]  DOB - November 12, 1965  Admit date - 03/04/2014  Admitting Physician Carron Curie, MD  Outpatient Primary MD for the patient is No PCP Per Patient  LOS - 28  CC: VDRF Acute Encephalopathy UTI PNA   Subjective:   [name]  has no new complaints.  Objective:   Vital signs  Tmax:    97.5 Heart rate   94 Respiratory rate  21 Blood pressure 126/69 Pulse Ox:   99%;  I/O:    2080/??    Exam HEENT: [place]/AT,PERRLA EOMI bilaterally. MMM, + gag reflex NECK: Supple, No JVD or LAD. Tracheostomy in place and secure #4 shiley LUNGS: Symmetrical Chest wall movement, Good air movement bilaterally CV: Normal S1, S2, No m/c/r; No Parasternal Heave ABD: Normactive BS x 4, S/NT/ND; No organomegaly appreciated, rebound, guarding or rigidity. +PEG tube No EXT: No Cyanosis, Clubbing or edema, pulses 2+,  No new rashes or bruises, restraints in place and loose fitting Neuro: RLE intentional tremor; SMAEs, motor and sensory intact and CN grossly intact Psych: flat affect and guarded  Data Review   CBC  Recent Labs Lab 03/30/14 0650  WBC 4.0  HGB 8.6*  HCT 28.5*  PLT 213  MCV 63.6*  MCH 19.2*  MCHC 30.2  RDW 16.5*    Chemistries   Recent Labs Lab 03/26/14 0555 03/27/14 0750 03/30/14 0650  NA 128* 139 135*  K 6.0* 4.1 4.9  CL 89* 94* 92*  CO2 GLUCOSE 485* 122* 166*  BUN 25* 34* 22  CREATININE 0.66 0.73 0.63  CALCIUM 8.7 9.2 9.3  MG 1.9  --  1.8  AST  --   --  15  ALT  --   --  7  ALKPHOS  --   --  63  BILITOT  --   --  <0.2*   ------------------------------------------------------------------------------------------------------------------ CrCl is unknown  because both a height and weight (above a minimum accepted value) are required for this calculation. ------------------------------------------------------------------------------------------------------------------ No results found for this basename: HGBA1C,  in the last 72 hours ------------------------------------------------------------------------------------------------------------------ No results found for this basename: CHOL, HDL, LDLCALC, TRIG, CHOLHDL, LDLDIRECT,  in the last 72 hours ------------------------------------------------------------------------------------------------------------------ No results found for this basename: TSH, T4TOTAL, FREET3, T3FREE, THYROIDAB,  in the last 72 hours ------------------------------------------------------------------------------------------------------------------ No results found for this basename: VITAMINB12, FOLATE, FERRITIN, TIBC, IRON, RETICCTPCT,  in the last 72 hours  Coagulation profile No results found for this basename: INR, PROTIME,  in the last 168 hours  No results found for this basename: DDIMER,  in the last 72 hours  Cardiac Enzymes No results found for this basename: CK, CKMB, TROPONINI, MYOGLOBIN,  in the last 168 hours ------------------------------------------------------------------------------------------------------------------ No components found with this basename: POCBNP,   Micro Results No results found for this or any previous visit (from  the past 240 hour(s)).  Assessment & Plan   VDRF -continue with ATC 28% during the night and capping during the day. No decannulation. #4 cuffless Hypoglycemia induced encephalopathy: -Continue with Seroquel Anemia of chronic disease and iron def, microcytic: Oh hemoglobin/hematocrit continue with ferrous sulfate  Prolonged QTc: -monitor and avoid haldol;  Diabetes mellitus II, uncontrolled and very labile: Continue with levemir and insulin sliding  scale Hyperkalemia:resolved Azotemia, acute: Monitor Malignant Hypertension/Tachycardia: Continue with metoprolol and lisinopril  Protein calorie malnutrition  Continue with nocturnal tube feeds and 5 modified thin liquids during the day Generalized weakness  continue with PT/OT   Disp: d/w care mgmt, pending Level II evaluation for transition to SNF DVT prophylaxis: Heparin  GI prophylaxis Pepcid  Carron Curie M.D on 04/01/2014 at 9:29 AM

## 2014-04-02 NOTE — Progress Notes (Signed)
[facility]                                                                                              Progress note        Patient Demographics  [name], is a 48 y.o. female  [id number]  [id number]  DOB - [birth year]  Admit date - 03/04/2014  Admitting Physician Carron Curie, MD  Outpatient Primary MD for the patient is No PCP Per Patient  LOS - 29  CC: VDRF Acute Encephalopathy UTI PNA   Subjective:   [name]  has no new complaints.  Objective:   Vital signs  Tmax:    97.8 Heart rate   79 Respiratory rate  16 Blood pressure 116/64 Pulse Ox:   99%;  I/O:    1340    Exam HEENT: [place]/AT,PERRLA EOMI bilaterally. MMM, + gag reflex NECK: Supple, No JVD or LAD. Tracheostomy in place and secure #4 shiley LUNGS: Symmetrical Chest wall movement, Good air movement bilaterally CV: Normal S1, S2, No m/c/r; No Parasternal Heave ABD: Normactive BS x 4, S/NT/ND; No organomegaly appreciated, rebound, guarding or rigidity. +PEG tube No EXT: No Cyanosis, Clubbing or edema, pulses 2+,  No new rashes or bruises, restraints in place and loose fitting Neuro: RLE intentional tremor; SMAEs, motor and sensory intact and CN grossly intact, more alert and awake Psych: flat affect and guarded  Data Review   CBC  Recent Labs Lab 03/30/14 0650  WBC 4.0  HGB 8.6*  HCT 28.5*  PLT 213  MCV 63.6*  MCH 19.2*  MCHC 30.2  RDW 16.5*    Chemistries   Recent Labs Lab 03/27/14 0750 03/30/14 0650  NA 139 135*  K 4.1 4.9  CL 94* 92*  CO2 30 27  GLUCOSE 122* 166*  BUN 34* 22  CREATININE 0.73 0.63  CALCIUM 9.2 9.3  MG  --  1.8  AST  --  15  ALT  --  7  ALKPHOS  --  63  BILITOT  --  <0.2*   ------------------------------------------------------------------------------------------------------------------ CrCl is unknown because both a height and weight (above a minimum  accepted value) are required for this calculation. ------------------------------------------------------------------------------------------------------------------ No results found for this basename: HGBA1C,  in the last 72 hours ------------------------------------------------------------------------------------------------------------------ No results found for this basename: CHOL, HDL, LDLCALC, TRIG, CHOLHDL, LDLDIRECT,  in the last 72 hours ------------------------------------------------------------------------------------------------------------------ No results found for this basename: TSH, T4TOTAL, FREET3, T3FREE, THYROIDAB,  in the last 72 hours ------------------------------------------------------------------------------------------------------------------ No results found for this basename: VITAMINB12, FOLATE, FERRITIN, TIBC, IRON, RETICCTPCT,  in the last 72 hours  Coagulation profile No results found for this basename: INR, PROTIME,  in the last 168 hours  No results found for this basename: DDIMER,  in the last 72 hours  Cardiac Enzymes No results found for this basename: CK, CKMB, TROPONINI, MYOGLOBIN,  in the last 168 hours ------------------------------------------------------------------------------------------------------------------ No components found with this basename: POCBNP,   Micro Results No results found for this or any previous visit (from the past 240 hour(s)).  Assessment & Plan   VDRF -continue with ATC 28% during the night and  capping during the day. No decannulation. #4 cuffless Hypoglycemia induced encephalopathy: -Continue with Seroquel Anemia of chronic disease and iron def, microcytic: Oh hemoglobin/hematocrit continue with ferrous sulfate  Prolonged QTc: -monitor and avoid haldol;  Diabetes mellitus II, uncontrolled and very labile: Continue with levemir and insulin sliding scale. Had an episode of hypoglycemia yesterday and her Levemir was  decreased Hyperkalemia:resolved Azotemia, acute: Resolved Malignant Hypertension/Tachycardia: Continue with metoprolol and lisinopril  Protein calorie malnutrition  Continue with nocturnal tube feeds and 5 modified thin liquids during the day Generalized weakness  continue with PT/OT Encephalopathy; hypoglycemia induced Monitor  Plan Check labs in a.m. Disp: d/w care mgmt, pending Level II evaluation for transition to SNF DVT prophylaxis: Heparin  GI prophylaxis Pepcid  Carron Curie M.D on 04/02/2014 at 12:35 PM

## 2014-04-03 LAB — COMPREHENSIVE METABOLIC PANEL
ALT: 8 U/L (ref 0–35)
AST: 16 U/L (ref 0–37)
Albumin: 3 g/dL — ABNORMAL LOW (ref 3.5–5.2)
Alkaline Phosphatase: 61 U/L (ref 39–117)
Anion gap: 13 (ref 5–15)
BUN: 18 mg/dL (ref 6–23)
CALCIUM: 9.1 mg/dL (ref 8.4–10.5)
CO2: 24 mEq/L (ref 19–32)
Chloride: 93 mEq/L — ABNORMAL LOW (ref 96–112)
Creatinine, Ser: 0.76 mg/dL (ref 0.50–1.10)
GFR calc Af Amer: >90 mL/min (ref >90)
GFR calc non Af Amer: >90 mL/min (ref >90)
Glucose, Bld: 319 mg/dL — ABNORMAL HIGH (ref 70–99)
Potassium: 4.9 mEq/L (ref 3.7–5.3)
SODIUM: 130 meq/L — AB (ref 137–147)
TOTAL PROTEIN: 7.3 g/dL (ref 6.0–8.3)
Total Bilirubin: 0.2 mg/dL — ABNORMAL LOW (ref 0.3–1.2)

## 2014-04-03 LAB — CBC
HCT: 29 % — ABNORMAL LOW (ref 36.0–46.0)
Hemoglobin: 8.9 g/dL — ABNORMAL LOW (ref 12.0–15.0)
MCH: 19.6 pg — AB (ref 26.0–34.0)
MCHC: 30.7 g/dL (ref 30.0–36.0)
MCV: 63.7 fL — ABNORMAL LOW (ref 78.0–100.0)
PLATELETS: 224 10*3/uL (ref 150–400)
RBC: 4.55 MIL/uL (ref 3.87–5.11)
RDW: 17 % — ABNORMAL HIGH (ref 11.5–15.5)
WBC: 4.2 10*3/uL (ref 4.0–10.5)

## 2014-04-03 NOTE — Progress Notes (Addendum)
[facility]                                                                                              Progress note        Patient Demographics  [name], is a 48 y.o. female  [id number]  [id number]  DOB - 04-10-1966  Admit date - 03/04/2014  Admitting Physician Carron Curie, MD  Outpatient Primary MD for the patient is No PCP Per Patient  LOS - 30  CC:  VDRF Acute Encephalopathy UTI PNA   Subjective:   [name]  has no new complaints.  Objective:   Vital signs  Tmax:    98.3 Heart rate   99 Respiratory rate  17 Blood pressure 111/63 Pulse Ox:   98%;  I/O:    960/300    Exam HEENT: /AT,PERRLA EOMI bilaterally. MMM, + gag reflex NECK: Supple, No JVD or LAD. Tracheostomy in place and secure #4 shiley LUNGS: Symmetrical Chest wall movement, Good air movement bilaterally CV: Normal S1, S2, No m/c/r; No Parasternal Heave ABD: Normactive BS x 4, S/NT/ND; No organomegaly appreciated, rebound, guarding or rigidity. +PEG tube No EXT: No Cyanosis, Clubbing or edema, pulses 2+,  No new rashes or bruises, restraints in place and loose fitting Neuro: RLE intentional tremor; SMAEs, motor and sensory intact and CN grossly intact, more alert and awake Psych: flat affect and guarded  Data Review   CBC  Recent Labs Lab 03/30/14 0650 04/03/14 0645  WBC 4.0 4.2  HGB 8.6* 8.9*  HCT 28.5* 29.0*  PLT 213 224  MCV 63.6* 63.7*  MCH 19.2* 19.6*  MCHC 30.2 30.7  RDW 16.5* 17.0*    Chemistries   Recent Labs Lab 03/30/14 0650 04/03/14 0645  NA 135* 130*  K 4.9 4.9  CL 92* 93*  CO2 27 24  GLUCOSE 166* 319*  BUN 22 18  CREATININE 0.63 0.76  CALCIUM 9.3 9.1  MG 1.8  --   AST 15 16  ALT 7 8  ALKPHOS 63 61  BILITOT <0.2* 0.2*   ------------------------------------------------------------------------------------------------------------------ CrCl is unknown  because both a height and weight (above a minimum accepted value) are required for this calculation. ------------------------------------------------------------------------------------------------------------------ No results found for this basename: HGBA1C,  in the last 72 hours ------------------------------------------------------------------------------------------------------------------ No results found for this basename: CHOL, HDL, LDLCALC, TRIG, CHOLHDL, LDLDIRECT,  in the last 72 hours ------------------------------------------------------------------------------------------------------------------ No results found for this basename: TSH, T4TOTAL, FREET3, T3FREE, THYROIDAB,  in the last 72 hours ------------------------------------------------------------------------------------------------------------------ No results found for this basename: VITAMINB12, FOLATE, FERRITIN, TIBC, IRON, RETICCTPCT,  in the last 72 hours  Coagulation profile No results found for this basename: INR, PROTIME,  in the last 168 hours  No results found for this basename: DDIMER,  in the last 72 hours  Cardiac Enzymes No results found for this basename: CK, CKMB, TROPONINI, MYOGLOBIN,  in the last 168 hours ------------------------------------------------------------------------------------------------------------------ No components found with this basename: POCBNP,   Micro Results No results found for this or any previous visit (from the past 240 hour(s)).  Assessment & Plan   VDRF -continue with ATC 28% during  the night and capping during the day. No decannulation. #4 cuffless Hypoglycemia induced encephalopathy: -Continue with Seroquel Anemia of chronic disease and iron def, microcytic: Oh hemoglobin/hematocrit continue with ferrous sulfate  Prolonged QTc: -monitor and avoid haldol;  Diabetes mellitus II, uncontrolled and very labile: Continue with levemir and insulin sliding scale. Had an  episode of hypoglycemia yesterday and her Levemir was decreased Hyperkalemia:resolved Azotemia, acute: Resolved Malignant Hypertension/Tachycardia: Continue with metoprolol and lisinopril  Protein calorie malnutrition  Continue with nocturnal tube feeds and 5 modified thin liquids during the day Generalized weakness  continue with PT/OT Encephalopathy; hypoglycemia induced Improving hyponatremia Probably hyperglycemia induced  Plan Increase levemir to 20 units subcutaneous twice a day Disp: d/w care mgmt, pending Level II evaluation for transition to SNF DVT prophylaxis: Heparin  GI prophylaxis Pepcid  Carron Curie M.D on 04/03/2014 at 12:18 PM

## 2014-04-04 LAB — GLUCOSE, RANDOM: GLUCOSE: 207 mg/dL — AB (ref 70–99)

## 2014-04-04 NOTE — Progress Notes (Addendum)
[facility]                                                                                              Progress note        Patient Demographics  [name], is a 48 y.o. female  [id number]  [id number]  DOB - 16-Jul-1966  Admit date - 03/04/2014  Admitting Physician Carron Curie, MD  Outpatient Primary MD for the patient is No PCP Per Patient  LOS - 31  CC:  VDRF Acute Encephalopathy UTI PNA   Subjective:   Latifah Pinnex  has no new complaints. Alert and awake however had an episode of hypoglycemia in the morning.  Objective:   Vital signs  Tmax:    97.8 Heart rate   98 Respiratory rate  16 Blood pressure 110/70 Pulse Ox:   100%  I/O:    1480/??    Exam HEENT: [place]/AT,PERRLA EOMI bilaterally. MMM, + gag reflex NECK: Supple, No JVD or LAD. Tracheostomy in place and secure #4 shiley LUNGS: Symmetrical Chest wall movement, Good air movement bilaterally CV: Normal S1, S2, No m/c/r; No Parasternal Heave ABD: Normactive BS x 4, S/NT/ND; No organomegaly appreciated, rebound, guarding or rigidity. +PEG tube No EXT: No Cyanosis, Clubbing or edema, pulses 2+,  No new rashes or bruises, restraints in place and loose fitting Neuro: RLE intentional tremor; SMAEs, motor and sensory intact and CN grossly intact, more alert and awake Psych: flat affect and guarded  Data Review   CBC  Recent Labs Lab 03/30/14 0650 04/03/14 0645  WBC 4.0 4.2  HGB 8.6* 8.9*  HCT 28.5* 29.0*  PLT 213 224  MCV 63.6* 63.7*  MCH 19.2* 19.6*  MCHC 30.2 30.7  RDW 16.5* 17.0*    Chemistries   Recent Labs Lab 03/30/14 0650 04/03/14 0645  NA 135* 130*  K 4.9 4.9  CL 92* 93*  CO2 27 24  GLUCOSE 166* 319*  BUN 22 18  CREATININE 0.63 0.76  CALCIUM 9.3 9.1  MG 1.8  --   AST 15 16  ALT 7 8  ALKPHOS 63 61  BILITOT <0.2* 0.2*    ------------------------------------------------------------------------------------------------------------------ CrCl is unknown because both a height and weight (above a minimum accepted value) are required for this calculation. ------------------------------------------------------------------------------------------------------------------ No results found for this basename: HGBA1C,  in the last 72 hours ------------------------------------------------------------------------------------------------------------------ No results found for this basename: CHOL, HDL, LDLCALC, TRIG, CHOLHDL, LDLDIRECT,  in the last 72 hours ------------------------------------------------------------------------------------------------------------------ No results found for this basename: TSH, T4TOTAL, FREET3, T3FREE, THYROIDAB,  in the last 72 hours ------------------------------------------------------------------------------------------------------------------ No results found for this basename: VITAMINB12, FOLATE, FERRITIN, TIBC, IRON, RETICCTPCT,  in the last 72 hours  Coagulation profile No results found for this basename: INR, PROTIME,  in the last 168 hours  No results found for this basename: DDIMER,  in the last 72 hours  Cardiac Enzymes No results found for this basename: CK, CKMB, TROPONINI, MYOGLOBIN,  in the last 168 hours ------------------------------------------------------------------------------------------------------------------ No components found with this basename: POCBNP,   Micro Results No results found for this or any previous visit (from the past 240 hour(s)).  Assessment & Plan   VDRF -continue with ATC 28% during the night and capping during the day. No decannulation. #4 cuffless Hypoglycemia induced encephalopathy: -Continue with Seroquel /fish oil Anemia of chronic disease and iron def, microcytic: Oh hemoglobin/hematocrit continue with ferrous sulfate  Prolonged  QTc: -monitor and avoid haldol;  Diabetes mellitus II, uncontrolled and very labile: Continue with levemir and insulin sliding scale. Still having episodes of hypoglycemia Hyperkalemia:resolved Azotemia, acute: Resolved Malignant Hypertension/Tachycardia: Continue with metoprolol and lisinopril  Protein calorie malnutrition  Continue with  modified thin liquids during the day Generalized weakness  continue with PT/OT Encephalopathy; hypoglycemia induced Improving hyponatremia Probably hyperglycemia induced  Plan We'll continue giving Levemir same dose for now Cover with low dose insulin sliding scale DVT prophylaxis: Heparin  GI prophylaxis Pepcid  Carron Curie M.D on 04/04/2014 at 2:10 PM

## 2014-04-05 NOTE — Progress Notes (Addendum)
[facility]                                                                                              Progress note        Patient Demographics  [name], is a 48 y.o. female  [id number]  [id number]  DOB - 1966/02/05  Admit date - 03/04/2014  Admitting Physician Carron Curie, MD  Outpatient Primary MD for the patient is No PCP Per Patient  LOS - 32  CC:  VDRF Acute Encephalopathy UTI PNA   Subjective:   [name]  has no new complaints. Alert and awake however had an episode of hypoglycemia in the morning.  Objective:   Vital signs  Tmax:    98.1 Heart rate   88 Respiratory rate  18 Blood pressure 102/66 Pulse Ox:   95%  I/O:    1560/??    Exam HEENT: [place]/AT,PERRLA EOMI bilaterally. MMM, + gag reflex NECK: Supple, No JVD or LAD. Tracheostomy in place and secure #4 shiley LUNGS: Symmetrical Chest wall movement, Good air movement bilaterally CV: Normal S1, S2, No m/c/r; No Parasternal Heave ABD: Normactive BS x 4, S/NT/ND; No organomegaly appreciated, rebound, guarding or rigidity. +PEG tube No EXT: No Cyanosis, Clubbing or edema, pulses 2+,  No new rashes or bruises, restraints in place and loose fitting Neuro: RLE intentional tremor; SMAEs, motor and sensory intact and CN grossly intact, more alert and awake Psych: flat affect and guarded  Data Review   CBC  Recent Labs Lab 03/30/14 0650 04/03/14 0645  WBC 4.0 4.2  HGB 8.6* 8.9*  HCT 28.5* 29.0*  PLT 213 224  MCV 63.6* 63.7*  MCH 19.2* 19.6*  MCHC 30.2 30.7  RDW 16.5* 17.0*    Chemistries   Recent Labs Lab 03/30/14 0650 04/03/14 0645 04/04/14 1500  NA 135* 130*  --   K 4.9 4.9  --   CL 92* 93*  --   CO2 27 24  --   GLUCOSE 166* 319* 207*  BUN 22 18  --   CREATININE 0.63 0.76  --   CALCIUM 9.3 9.1  --   MG 1.8  --   --   AST 15 16  --   ALT 7 8  --   ALKPHOS 63 61  --   BILITOT  <0.2* 0.2*  --    ------------------------------------------------------------------------------------------------------------------ CrCl is unknown because both a height and weight (above a minimum accepted value) are required for this calculation. ------------------------------------------------------------------------------------------------------------------ No results found for this basename: HGBA1C,  in the last 72 hours ------------------------------------------------------------------------------------------------------------------ No results found for this basename: CHOL, HDL, LDLCALC, TRIG, CHOLHDL, LDLDIRECT,  in the last 72 hours ------------------------------------------------------------------------------------------------------------------ No results found for this basename: TSH, T4TOTAL, FREET3, T3FREE, THYROIDAB,  in the last 72 hours ------------------------------------------------------------------------------------------------------------------ No results found for this basename: VITAMINB12, FOLATE, FERRITIN, TIBC, IRON, RETICCTPCT,  in the last 72 hours  Coagulation profile No results found for this basename: INR, PROTIME,  in the last 168 hours  No results found for this basename: DDIMER,  in the last 72 hours  Cardiac Enzymes No results found  for this basename: CK, CKMB, TROPONINI, MYOGLOBIN,  in the last 168 hours ------------------------------------------------------------------------------------------------------------------ No components found with this basename: POCBNP,   Micro Results No results found for this or any previous visit (from the past 240 hour(s)).  Assessment & Plan   VDRF -continue with ATC 28% during the night and capping during the day. No decannulation. #4 cuffless Hypoglycemia induced encephalopathy: -Continue with Seroquel /fish oil Anemia of chronic disease and iron def, microcytic: Oh hemoglobin/hematocrit continue with ferrous  sulfate  Prolonged QTc: -monitor and avoid haldol;  Diabetes mellitus II, uncontrolled and very labile: Continue with levemir and insulin sliding scale. Still having episodes of hypoglycemia Hyperkalemia:resolved Azotemia, acute: Resolved Malignant Hypertension/Tachycardia: Continue with metoprolol and lisinopril  Protein calorie malnutrition  Continue with modified thin liquids during the day Generalized weakness  continue with PT/OT Encephalopathy; hypoglycemia induced Improving hyponatremia Probably hyperglycemia induced  Plan We'll continue given Levemir same dose for now Cover with low dose insulin sliding scale  DVT prophylaxis: Heparin  GI prophylaxis Hiram Comber M.D on 04/05/2014 at 12:18 PM

## 2014-04-06 NOTE — Progress Notes (Addendum)
[facility]                                                                                              Progress note        Patient Demographics  [name], is a 48 y.o. female  [id number]  [id number]  DOB - 06-10-1966  Admit date - 03/04/2014  Admitting Physician Carron Curie, MD  Outpatient Primary MD for the patient is No PCP Per Patient  LOS - 33  CC:  VDRF Acute Encephalopathy UTI PNA   Subjective:   [name]  has no new complaints. Alert and awake however had an episode of hypoglycemia in the morning again.  Objective:   Vital signs  Tmax:    97.6 Heart rate   107 Respiratory rate  50 Blood pressure 126/74 Pulse Ox:   95%  I/O:    Unknown    Exam HEENT: [place]/AT,PERRLA EOMI bilaterally. MMM, + gag reflex NECK: Supple, No JVD or LAD. Tracheostomy in place and secure #4 shiley LUNGS: Symmetrical Chest wall movement, Good air movement bilaterally CV: Normal S1, S2, No m/c/r; No Parasternal Heave ABD: Normactive BS x 4, S/NT/ND; No organomegaly appreciated, rebound, guarding or rigidity. +PEG tube No EXT: No Cyanosis, Clubbing or edema, pulses 2+,  No new rashes or bruises,  Neuro: ; SMAEs, motor and sensory intact and CN grossly intact, alert and awake Psych: flat affect and guarded  Data Review   CBC  Recent Labs Lab 04/03/14 0645  WBC 4.2  HGB 8.9*  HCT 29.0*  PLT 224  MCV 63.7*  MCH 19.6*  MCHC 30.7  RDW 17.0*    Chemistries   Recent Labs Lab 04/03/14 0645 04/04/14 1500  NA 130*  --   K 4.9  --   CL 93*  --   CO2 24  --   GLUCOSE 319* 207*  BUN 18  --   CREATININE 0.76  --   CALCIUM 9.1  --   AST 16  --   ALT 8  --   ALKPHOS 61  --   BILITOT 0.2*  --    ------------------------------------------------------------------------------------------------------------------ CrCl is unknown because both a height and weight (above a  minimum accepted value) are required for this calculation. ------------------------------------------------------------------------------------------------------------------ No results found for this basename: HGBA1C,  in the last 72 hours ------------------------------------------------------------------------------------------------------------------ No results found for this basename: CHOL, HDL, LDLCALC, TRIG, CHOLHDL, LDLDIRECT,  in the last 72 hours ------------------------------------------------------------------------------------------------------------------ No results found for this basename: TSH, T4TOTAL, FREET3, T3FREE, THYROIDAB,  in the last 72 hours ------------------------------------------------------------------------------------------------------------------ No results found for this basename: VITAMINB12, FOLATE, FERRITIN, TIBC, IRON, RETICCTPCT,  in the last 72 hours  Coagulation profile No results found for this basename: INR, PROTIME,  in the last 168 hours  No results found for this basename: DDIMER,  in the last 72 hours  Cardiac Enzymes No results found for this basename: CK, CKMB, TROPONINI, MYOGLOBIN,  in the last 168 hours ------------------------------------------------------------------------------------------------------------------ No components found with this basename: POCBNP,   Micro Results No results found for this or any previous visit (from the past 240 hour(s)).  Assessment & Plan   VDRF resolved -continue with ATC 28% during the night and capping during the day. No decannulation. #4 cuffless Hypoglycemia induced encephalopathy: -Continue with Seroquel /fish oil Anemia of chronic disease and iron def, microcytic: Oh hemoglobin/hematocrit continue with ferrous sulfate  Prolonged QTc: -monitor and avoid haldol;  Diabetes mellitus II, uncontrolled and very labile: Still having episodes of hypoglycemia in a.m. Decreased  Levemir Hyperkalemia:resolved Azotemia, acute: Resolved Malignant Hypertension/Tachycardia: Continue with metoprolol and lisinopril  Protein calorie malnutrition  Continue  modified thin liquids during the day Generalized weakness  continue with PT/OT Encephalopathy; hypoglycemia induced Improving hyponatremia Probably hyperglycemia induced  Plan Decreased Levemir 15 units subcutaneous twice a day   DVT prophylaxis: Heparin  GI prophylaxis [name] M.D on 04/06/2014 at 1:28 PM

## 2014-04-07 NOTE — Progress Notes (Signed)
[facility]                                                                                              Progress note        Patient Demographics  [name], is a 48 y.o. female  [id number]  [id number]  DOB - Jan 06, 1966  Admit date - 03/04/2014  Admitting Physician Carron Curie, MD  Outpatient Primary MD for the patient is No PCP Per Patient  LOS - 34  CC: VDRF Acute Encephalopathy UTI PNA   Subjective:   [name]  Is cooperative and denies any complaints today. She states she feels very blessed.  Objective:   Vital signs  Tmax:    98.73F Heart rate   84-96 Respiratory rate  18-20 Blood pressure 110/66-118/60 Pulse Ox:    98-100%  I/O:    1080/1200 BM:   1 BG:    44-240  Exam HEENT: [place]/AT,PERRLA EOMI bilaterally. MMM, + gag reflex NECK: Supple, No JVD or LAD. Tracheostomy in place and secure #4 shiley LUNGS: Symmetrical Chest wall movement, Good air movement bilaterally CV: Normal S1, S2, No m/c/r; No Parasternal Heave ABD: Normactive BS x 4, S/NT/ND; No organomegaly appreciated, rebound, guarding or rigidity. +PEG tube EXT: No Cyanosis, Clubbing or edema, pulses 2+,  No new rashes or bruises, restraints in place and loose fitting Neuro: RLE intentional tremor; SMAEs, motor and sensory intact and CN grossly intact Psych: flat affect, pleasant and cooperative  Data Review   CBC  Recent Labs Lab 04/03/14 0645  WBC 4.2  HGB 8.9*  HCT 29.0*  PLT 224  MCV 63.7*  MCH 19.6*  MCHC 30.7  RDW 17.0*    Chemistries   Recent Labs Lab 04/03/14 0645 04/04/14 1500  NA 130*  --   K 4.9  --   CL 93*  --   CO2 24  --   GLUCOSE 319* 207*  BUN 18  --   CREATININE 0.76  --   CALCIUM 9.1  --   AST 16  --   ALT 8  --   ALKPHOS 61  --   BILITOT 0.2*  --     ------------------------------------------------------------------------------------------------------------------ CrCl is unknown because both a height and weight (above a minimum accepted value) are required for this calculation. ------------------------------------------------------------------------------------------------------------------ No results found for this basename: HGBA1C,  in the last 72 hours ------------------------------------------------------------------------------------------------------------------ No results found for this basename: CHOL, HDL, LDLCALC, TRIG, CHOLHDL, LDLDIRECT,  in the last 72 hours ------------------------------------------------------------------------------------------------------------------ No results found for this basename: TSH, T4TOTAL, FREET3, T3FREE, THYROIDAB,  in the last 72 hours ------------------------------------------------------------------------------------------------------------------ No results found for this basename: VITAMINB12, FOLATE, FERRITIN, TIBC, IRON, RETICCTPCT,  in the last 72 hours  Coagulation profile No results found for this basename: INR, PROTIME,  in the last 168 hours  No results found for this basename: DDIMER,  in the last 72 hours  Cardiac Enzymes No results found for this basename: CK, CKMB, TROPONINI, MYOGLOBIN,  in the last 168 hours ------------------------------------------------------------------------------------------------------------------ No components found with this basename: POCBNP,   Micro Results No results found for this or any previous  visit (from the past 240 hour(s)).  Assessment & Plan   Acute Respiratory Failure Ventilator Dependent status post tracheostomy: -resolved -tolerating and continue ATC 28%, #4 cuffless during the day and capping at night, No decannulation recommended  Acute Encephalopathy/Acute Delirium secondary to hypoglycemia: -resolved  Chronic  Schizophrenia: -cont maintenance medications of Seroquel  qam and  qhs, Geodon  PO BID, Klonipin 0.25mg  -check CBC prn given risk of leukopenia -Level II completed  Anemia of chronic disease and iron def, microcytic: -start ferrous sulfate  PO BID + vitamin C  PO BID and continue senna-s to prevent constipation -recommend PCP recheck in 3 months and pursue further workup to include oupt endoscopic studies.   -H/H stable, worse with lab draws  Prolonged QTc: -monitor and avoid haldol; d/c zofran  Diabetes mellitus II, uncontrolled and very labile: improved today but some episodes of hypoglycemia -changed levemir to 8U BID with very low dose SSI; lower levemir dose likely needed b/c TF d/c'd -continue hypoglycemic protocol -continue lisinopril  daily for renal protection and monitor BMP prn  Malignant Hypertension: -normotensive with resolution of VDRF, Acute Encephalopathy, and active Schizophrenia mgmt (possible benefit of seroquel resulting in hypotension) -cont lisinopril as ordered  Tachycardia: -resolved; pt was requiring metoprolol  BID, if recur will restart beta-blocker and titrate to goal and tolerance  Abnormal TSH: -8/20; TSH elevated >7; recheck in am, if still elevated will start synthroid PO daily and recommend recheck by PCP in 4 weeks and outpt thyroid U/S  Protein calorie malnutrition/Anemia of chronic disease/Generalized weakness: -Eating 50-100% of PO Diet per RD/ST recommendations: now on Carb Mod with Thin Liquids and TF stopped today -pt refusing Ensure supplement most often; d/w RD other offerings  -PT/OT: pt participating well and able to ambulate with assist and walker  GI proph: pepcid/probitic VTE prop: heparin 5000U q 8h  Disp: d/w care mgmt, Level II evaluation completed, plan for transition to SNF  [facility] Sharyon Medicus M.D on 04/07/2014 at 9:30 PM

## 2014-04-08 LAB — TSH: TSH: 5.15 u[IU]/mL — AB (ref 0.350–4.500)

## 2014-04-08 NOTE — Progress Notes (Signed)
[facility]                                                                                              Progress note        Patient Demographics  [name], is a 48 y.o. female  [id number]  [id number]  DOB - Oct 06, 1965  Admit date - 03/04/2014  Admitting Physician Carron Curie, MD  Outpatient Primary MD for the patient is No PCP Per Patient  LOS - 35  CC: VDRF Acute Encephalopathy UTI PNA   Subjective:   [name]  Is cooperative and denies any complaints today. She states she feels very blessed and eating 75% of meals. Objective:   Vital signs  Tmax:    98.24F Heart rate   81-98 Respiratory rate  18 Blood pressure 104/54-118/60 Pulse Ox:    98-100%  I/O:    1800/Adequate UOP BM:   2 BG:    107-241  Exam HEENT: [place]/AT,PERRLA EOMI bilaterally. MMM, + gag reflex NECK: Supple, No JVD or LAD. Tracheostomy in place and secure #4 shiley LUNGS: Symmetrical Chest wall movement, Good air movement bilaterally CV: Normal S1, S2, No m/c/r; No Parasternal Heave ABD: Normactive BS x 4, S/NT/ND; No organomegaly appreciated, rebound, guarding or rigidity. +PEG tube EXT: No Cyanosis, Clubbing or edema, pulses 2+,  No new rashes or bruises, restraints in place and loose fitting Neuro: RLE intentional tremor; SMAEs, motor and sensory intact and CN grossly intact Psych: flat affect, pleasant and cooperative  Data Review   CBC  Recent Labs Lab 04/03/14 0645  WBC 4.2  HGB 8.9*  HCT 29.0*  PLT 224  MCV 63.7*  MCH 19.6*  MCHC 30.7  RDW 17.0*    Chemistries   Recent Labs Lab 04/03/14 0645 04/04/14 1500  NA 130*  --   K 4.9  --   CL 93*  --   CO2 24  --   GLUCOSE 319* 207*  BUN 18  --   CREATININE 0.76  --   CALCIUM 9.1  --   AST 16  --   ALT 8  --   ALKPHOS 61  --   BILITOT 0.2*  --     ------------------------------------------------------------------------------------------------------------------ CrCl is unknown because both a height and weight (above a minimum accepted value) are required for this calculation. ------------------------------------------------------------------------------------------------------------------ No results found for this basename: HGBA1C,  in the last 72 hours ------------------------------------------------------------------------------------------------------------------ No results found for this basename: CHOL, HDL, LDLCALC, TRIG, CHOLHDL, LDLDIRECT,  in the last 72 hours ------------------------------------------------------------------------------------------------------------------  Recent Labs  04/08/14 1217  TSH 5.150*   ------------------------------------------------------------------------------------------------------------------ No results found for this basename: VITAMINB12, FOLATE, FERRITIN, TIBC, IRON, RETICCTPCT,  in the last 72 hours  Coagulation profile No results found for this basename: INR, PROTIME,  in the last 168 hours  No results found for this basename: DDIMER,  in the last 72 hours  Cardiac Enzymes No results found for this basename: CK, CKMB, TROPONINI, MYOGLOBIN,  in the last 168 hours ------------------------------------------------------------------------------------------------------------------ No components found with this basename: POCBNP,   Micro Results No results found for this or any previous visit (  from the past 240 hour(s)).  Assessment & Plan   Continue active plan as noted below; pt to be d/c to SNF tomorrow: (ADD SYNTHROID TO D/C MEDS)  Acute Respiratory Failure Ventilator Dependent status post tracheostomy: -resolved -tolerating and continue ATC 28%, #4 cuffless during the day and capping at night, No decannulation recommended  Chronic Schizophrenia: -cont maintenance medications  of Seroquel  qam and  qhs, Geodon  PO BID, Klonipin 0.25mg  -check CBC prn given risk of leukopenia -Level II completed  Anemia of chronic disease and iron def, microcytic: -start ferrous sulfate  PO BID + vitamin C  PO BID and continue senna-s to prevent constipation -recommend PCP recheck in 3 months and pursue further workup to include oupt endoscopic studies.   -H/H stable, worse with lab draws  Prolonged QTc: -monitor and avoid haldol; d/c zofran  Diabetes mellitus II, uncontrolled and very labile: improved today but some episodes of hypoglycemia -changed levemir to 8U BID with very low dose SSI; lower levemir dose likely needed b/c TF d/c'd -continue hypoglycemic protocol -continue lisinopril  daily for renal protection and monitor BMP prn  Malignant Hypertension: -normotensive with resolution of VDRF, Acute Encephalopathy, and active Schizophrenia mgmt (possible benefit of seroquel resulting in hypotension) -cont lisinopril as ordered  Tachycardia: -resolved; pt was requiring metoprolol  BID, if recur will restart beta-blocker and titrate to goal and tolerance  Abnormal TSH: -8/20; TSH elevated >7; recheck today and >5, start synthroid PO daily and recommend recheck by PCP in 4 weeks and outpt thyroid U/S  Protein calorie malnutrition/Anemia of chronic disease/Generalized weakness: -Eating 50-100% of PO Diet per RD/ST recommendations: now on Carb Mod with Thin Liquids and TF stopped today -pt refusing Ensure supplement most often; d/w RD other offerings  -PT/OT: pt participating well and able to ambulate with assist and walker  GI proph: pepcid/probitic VTE prop: heparin 5000U q 8h  Disp: D/C to SNF tomorrow, 9/24  Berton Lan M.D on 04/08/2014 at 11:53 PM

## 2014-04-24 ENCOUNTER — Encounter: Payer: Self-pay | Admitting: Dietician

## 2014-05-13 ENCOUNTER — Telehealth: Payer: Self-pay | Admitting: Dietician

## 2014-05-14 NOTE — Telephone Encounter (Signed)
Transferred to front office to schedule an aoppointment

## 2014-05-15 ENCOUNTER — Encounter (HOSPITAL_COMMUNITY): Payer: Self-pay | Admitting: Emergency Medicine

## 2014-05-15 ENCOUNTER — Inpatient Hospital Stay (HOSPITAL_COMMUNITY)
Admission: EM | Admit: 2014-05-15 | Discharge: 2014-05-22 | DRG: 637 | Disposition: A | Discharge status: Home Health | Payer: Medicaid Other | Attending: Infectious Diseases | Admitting: Infectious Diseases

## 2014-05-15 ENCOUNTER — Emergency Department (HOSPITAL_COMMUNITY): Payer: Medicaid Other

## 2014-05-15 DIAGNOSIS — I214 Non-ST elevation (NSTEMI) myocardial infarction: Secondary | ICD-10-CM

## 2014-05-15 DIAGNOSIS — E039 Hypothyroidism, unspecified: Secondary | ICD-10-CM | POA: Diagnosis present

## 2014-05-15 DIAGNOSIS — F1911 Other psychoactive substance abuse, in remission: Secondary | ICD-10-CM | POA: Diagnosis present

## 2014-05-15 DIAGNOSIS — Z8249 Family history of ischemic heart disease and other diseases of the circulatory system: Secondary | ICD-10-CM | POA: Diagnosis not present

## 2014-05-15 DIAGNOSIS — Z6821 Body mass index (BMI) 21.0-21.9, adult: Secondary | ICD-10-CM | POA: Diagnosis not present

## 2014-05-15 DIAGNOSIS — E872 Acidosis, unspecified: Secondary | ICD-10-CM

## 2014-05-15 DIAGNOSIS — R058 Other specified cough: Secondary | ICD-10-CM

## 2014-05-15 DIAGNOSIS — Z79891 Long term (current) use of opiate analgesic: Secondary | ICD-10-CM | POA: Diagnosis not present

## 2014-05-15 DIAGNOSIS — R9431 Abnormal electrocardiogram [ECG] [EKG]: Secondary | ICD-10-CM

## 2014-05-15 DIAGNOSIS — Z91128 Patient's intentional underdosing of medication regimen for other reason: Secondary | ICD-10-CM | POA: Diagnosis present

## 2014-05-15 DIAGNOSIS — E860 Dehydration: Secondary | ICD-10-CM | POA: Diagnosis present

## 2014-05-15 DIAGNOSIS — I2510 Atherosclerotic heart disease of native coronary artery without angina pectoris: Secondary | ICD-10-CM | POA: Diagnosis present

## 2014-05-15 DIAGNOSIS — R0602 Shortness of breath: Secondary | ICD-10-CM

## 2014-05-15 DIAGNOSIS — E1010 Type 1 diabetes mellitus with ketoacidosis without coma: Secondary | ICD-10-CM | POA: Diagnosis present

## 2014-05-15 DIAGNOSIS — Z930 Tracheostomy status: Secondary | ICD-10-CM | POA: Diagnosis not present

## 2014-05-15 DIAGNOSIS — B9561 Methicillin susceptible Staphylococcus aureus infection as the cause of diseases classified elsewhere: Secondary | ICD-10-CM | POA: Diagnosis present

## 2014-05-15 DIAGNOSIS — F99 Mental disorder, not otherwise specified: Secondary | ICD-10-CM

## 2014-05-15 DIAGNOSIS — Z833 Family history of diabetes mellitus: Secondary | ICD-10-CM | POA: Diagnosis not present

## 2014-05-15 DIAGNOSIS — Z794 Long term (current) use of insulin: Secondary | ICD-10-CM | POA: Diagnosis not present

## 2014-05-15 DIAGNOSIS — I5021 Acute systolic (congestive) heart failure: Secondary | ICD-10-CM

## 2014-05-15 DIAGNOSIS — Z87891 Personal history of nicotine dependence: Secondary | ICD-10-CM | POA: Diagnosis not present

## 2014-05-15 DIAGNOSIS — E1065 Type 1 diabetes mellitus with hyperglycemia: Secondary | ICD-10-CM

## 2014-05-15 DIAGNOSIS — I5032 Chronic diastolic (congestive) heart failure: Secondary | ICD-10-CM | POA: Diagnosis present

## 2014-05-15 DIAGNOSIS — I10 Essential (primary) hypertension: Secondary | ICD-10-CM | POA: Diagnosis present

## 2014-05-15 DIAGNOSIS — T383X6A Underdosing of insulin and oral hypoglycemic [antidiabetic] drugs, initial encounter: Secondary | ICD-10-CM | POA: Diagnosis present

## 2014-05-15 DIAGNOSIS — IMO0002 Reserved for concepts with insufficient information to code with codable children: Secondary | ICD-10-CM

## 2014-05-15 DIAGNOSIS — Z931 Gastrostomy status: Secondary | ICD-10-CM | POA: Diagnosis not present

## 2014-05-15 DIAGNOSIS — E874 Mixed disorder of acid-base balance: Secondary | ICD-10-CM | POA: Diagnosis present

## 2014-05-15 DIAGNOSIS — D638 Anemia in other chronic diseases classified elsewhere: Secondary | ICD-10-CM | POA: Diagnosis present

## 2014-05-15 DIAGNOSIS — E876 Hypokalemia: Secondary | ICD-10-CM | POA: Diagnosis not present

## 2014-05-15 DIAGNOSIS — Z818 Family history of other mental and behavioral disorders: Secondary | ICD-10-CM | POA: Diagnosis not present

## 2014-05-15 DIAGNOSIS — D509 Iron deficiency anemia, unspecified: Secondary | ICD-10-CM

## 2014-05-15 DIAGNOSIS — E785 Hyperlipidemia, unspecified: Secondary | ICD-10-CM | POA: Diagnosis present

## 2014-05-15 DIAGNOSIS — R64 Cachexia: Secondary | ICD-10-CM | POA: Diagnosis present

## 2014-05-15 DIAGNOSIS — R109 Unspecified abdominal pain: Secondary | ICD-10-CM

## 2014-05-15 DIAGNOSIS — F419 Anxiety disorder, unspecified: Secondary | ICD-10-CM | POA: Diagnosis present

## 2014-05-15 DIAGNOSIS — R4182 Altered mental status, unspecified: Secondary | ICD-10-CM | POA: Diagnosis present

## 2014-05-15 DIAGNOSIS — I248 Other forms of acute ischemic heart disease: Secondary | ICD-10-CM | POA: Diagnosis present

## 2014-05-15 DIAGNOSIS — R05 Cough: Secondary | ICD-10-CM

## 2014-05-15 DIAGNOSIS — Z431 Encounter for attention to gastrostomy: Secondary | ICD-10-CM

## 2014-05-15 DIAGNOSIS — D72829 Elevated white blood cell count, unspecified: Secondary | ICD-10-CM | POA: Diagnosis not present

## 2014-05-15 DIAGNOSIS — E1110 Type 2 diabetes mellitus with ketoacidosis without coma: Secondary | ICD-10-CM

## 2014-05-15 DIAGNOSIS — E875 Hyperkalemia: Secondary | ICD-10-CM | POA: Diagnosis present

## 2014-05-15 DIAGNOSIS — R400 Somnolence: Secondary | ICD-10-CM

## 2014-05-15 DIAGNOSIS — F319 Bipolar disorder, unspecified: Secondary | ICD-10-CM | POA: Diagnosis present

## 2014-05-15 DIAGNOSIS — I9589 Other hypotension: Secondary | ICD-10-CM

## 2014-05-15 DIAGNOSIS — I959 Hypotension, unspecified: Secondary | ICD-10-CM

## 2014-05-15 DIAGNOSIS — F329 Major depressive disorder, single episode, unspecified: Secondary | ICD-10-CM

## 2014-05-15 DIAGNOSIS — J0410 Acute tracheitis without obstruction: Secondary | ICD-10-CM | POA: Diagnosis present

## 2014-05-15 DIAGNOSIS — E873 Alkalosis: Secondary | ICD-10-CM

## 2014-05-15 DIAGNOSIS — G9340 Encephalopathy, unspecified: Secondary | ICD-10-CM

## 2014-05-15 DIAGNOSIS — E1069 Type 1 diabetes mellitus with other specified complication: Secondary | ICD-10-CM | POA: Diagnosis present

## 2014-05-15 DIAGNOSIS — N179 Acute kidney failure, unspecified: Secondary | ICD-10-CM | POA: Diagnosis present

## 2014-05-15 LAB — CBC WITH DIFFERENTIAL/PLATELET
Basophils Absolute: 0 10*3/uL (ref 0.0–0.1)
Basophils Relative: 0 % (ref 0–1)
EOS PCT: 0 % (ref 0–5)
Eosinophils Absolute: 0 10*3/uL (ref 0.0–0.7)
HCT: 34.1 % — ABNORMAL LOW (ref 36.0–46.0)
HEMOGLOBIN: 10.3 g/dL — AB (ref 12.0–15.0)
LYMPHS PCT: 7 % — AB (ref 12–46)
Lymphs Abs: 2 10*3/uL (ref 0.7–4.0)
MCH: 19.8 pg — ABNORMAL LOW (ref 26.0–34.0)
MCHC: 30.2 g/dL (ref 30.0–36.0)
MCV: 65.5 fL — ABNORMAL LOW (ref 78.0–100.0)
MONOS PCT: 11 % (ref 3–12)
Monocytes Absolute: 3.1 10*3/uL — ABNORMAL HIGH (ref 0.1–1.0)
NEUTROS ABS: 22.9 10*3/uL — AB (ref 1.7–7.7)
Neutrophils Relative %: 82 % — ABNORMAL HIGH (ref 43–77)
Platelets: 382 10*3/uL (ref 150–400)
RBC: 5.21 MIL/uL — AB (ref 3.87–5.11)
WBC: 28 10*3/uL — ABNORMAL HIGH (ref 4.0–10.5)

## 2014-05-15 LAB — COMPREHENSIVE METABOLIC PANEL
ALT: 12 U/L (ref 0–35)
AST: 24 U/L (ref 0–37)
Albumin: 4.4 g/dL (ref 3.5–5.2)
Alkaline Phosphatase: 101 U/L (ref 39–117)
BUN: 63 mg/dL — ABNORMAL HIGH (ref 6–23)
CALCIUM: 9.2 mg/dL (ref 8.4–10.5)
CHLORIDE: 81 meq/L — AB (ref 96–112)
CO2: <7 mEq/L — CL (ref 19–32)
Creatinine, Ser: 2.76 mg/dL — ABNORMAL HIGH (ref 0.50–1.10)
GFR calc Af Amer: 22 mL/min — ABNORMAL LOW (ref >90)
GFR calc non Af Amer: 19 mL/min — ABNORMAL LOW (ref >90)
Glucose, Bld: 1088 mg/dL (ref 70–99)
Potassium: 7.2 mEq/L (ref 3.7–5.3)
SODIUM: 130 meq/L — AB (ref 137–147)
Total Bilirubin: 0.2 mg/dL — ABNORMAL LOW (ref 0.3–1.2)
Total Protein: 8.5 g/dL — ABNORMAL HIGH (ref 6.0–8.3)

## 2014-05-15 LAB — I-STAT VENOUS BLOOD GAS, ED
Acid-base deficit: 23 mmol/L — ABNORMAL HIGH (ref 0.0–2.0)
Bicarbonate: 6.6 mEq/L — ABNORMAL LOW (ref 20.0–24.0)
O2 Saturation: 51 %
PCO2 VEN: 26.1 mmHg — AB (ref 45.0–50.0)
PO2 VEN: 40 mmHg (ref 30.0–45.0)
TCO2: 7 mmol/L (ref 0–100)
pH, Ven: 7.01 — CL (ref 7.250–7.300)

## 2014-05-15 LAB — CBG MONITORING, ED
Glucose-Capillary: >600 mg/dL (ref 70–99)
Glucose-Capillary: >600 mg/dL (ref 70–99)

## 2014-05-15 LAB — URINALYSIS, ROUTINE W REFLEX MICROSCOPIC
Bilirubin Urine: NEGATIVE
Ketones, ur: >80 mg/dL — AB
Leukocytes, UA: NEGATIVE
Nitrite: NEGATIVE
PH: 5.5 (ref 5.0–8.0)
PROTEIN: NEGATIVE mg/dL
Specific Gravity, Urine: 1.022 (ref 1.005–1.030)
Urobilinogen, UA: 0.2 mg/dL (ref 0.0–1.0)

## 2014-05-15 LAB — POC URINE PREG, ED: Preg Test, Ur: NEGATIVE

## 2014-05-15 LAB — RAPID URINE DRUG SCREEN, HOSP PERFORMED
Amphetamines: NOT DETECTED
BENZODIAZEPINES: NOT DETECTED
Barbiturates: NOT DETECTED
COCAINE: NOT DETECTED
OPIATES: NOT DETECTED
Tetrahydrocannabinol: NOT DETECTED

## 2014-05-15 LAB — URINE MICROSCOPIC-ADD ON

## 2014-05-15 LAB — I-STAT CG4 LACTIC ACID, ED: LACTIC ACID, VENOUS: 5.23 mmol/L — AB (ref 0.5–2.2)

## 2014-05-15 MED ORDER — SODIUM CHLORIDE 0.9 % IV BOLUS (SEPSIS)
1000.0000 mL | Freq: Once | INTRAVENOUS | Status: AC
  Administered 2014-05-15: 1000 mL via INTRAVENOUS

## 2014-05-15 MED ORDER — SODIUM CHLORIDE 0.9 % IV SOLN
1000.0000 mL | INTRAVENOUS | Status: DC
  Administered 2014-05-15 (×2): 1000 mL via INTRAVENOUS

## 2014-05-15 MED ORDER — LORAZEPAM 1 MG PO TABS
0.5000 mg | ORAL_TABLET | Freq: Once | ORAL | Status: DC
  Filled 2014-05-15: qty 1

## 2014-05-15 MED ORDER — SODIUM CHLORIDE 0.9 % IV SOLN
1000.0000 mL | Freq: Once | INTRAVENOUS | Status: AC
  Administered 2014-05-15: 1000 mL via INTRAVENOUS

## 2014-05-15 MED ORDER — SODIUM CHLORIDE 0.9 % IV SOLN
INTRAVENOUS | Status: DC
  Administered 2014-05-15: 5.4 [IU]/h via INTRAVENOUS
  Filled 2014-05-15: qty 2.5

## 2014-05-15 MED ORDER — LORAZEPAM 2 MG/ML IJ SOLN
0.5000 mg | Freq: Once | INTRAMUSCULAR | Status: AC
  Administered 2014-05-15: 0.5 mg via INTRAVENOUS
  Filled 2014-05-15: qty 1

## 2014-05-15 MED ORDER — SODIUM CHLORIDE 0.9 % IV SOLN
1000.0000 mL | Freq: Once | INTRAVENOUS | Status: AC
Start: 2014-05-15 — End: 2014-05-15
  Administered 2014-05-15: 1000 mL via INTRAVENOUS
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
## 2014-05-15 NOTE — ED Notes (Signed)
CBG = High  RN informed.

## 2014-05-15 NOTE — ED Notes (Signed)
Lab results reported to Nurse Brett CanalesSteve.

## 2014-05-15 NOTE — ED Notes (Signed)
Dr Elesa MassedWard given a copy of lactic acid 5.23

## 2014-05-15 NOTE — ED Notes (Signed)
Report attempted, RN to call back. 

## 2014-05-15 NOTE — ED Notes (Signed)
Trach pt, family reports pt being altered x 5 hours. Pt has blank stare on her face and not responding to family as she normally does. Pt cool to touch, best spo2 at triage was 88%.
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
## 2014-05-15 NOTE — ED Notes (Signed)
CBG = HIGH  Dr. Elesa MassedWard and Viviann SpareSteven RN informed of result.

## 2014-05-15 NOTE — ED Notes (Signed)
20:00 CBG = HIGH  Lanora ManisElizabeth RN informed.

## 2014-05-16 ENCOUNTER — Inpatient Hospital Stay (HOSPITAL_COMMUNITY): Payer: Medicaid Other

## 2014-05-16 DIAGNOSIS — I214 Non-ST elevation (NSTEMI) myocardial infarction: Secondary | ICD-10-CM

## 2014-05-16 DIAGNOSIS — F1910 Other psychoactive substance abuse, uncomplicated: Secondary | ICD-10-CM

## 2014-05-16 DIAGNOSIS — G9340 Encephalopathy, unspecified: Secondary | ICD-10-CM | POA: Diagnosis present

## 2014-05-16 DIAGNOSIS — D72829 Elevated white blood cell count, unspecified: Secondary | ICD-10-CM

## 2014-05-16 DIAGNOSIS — R9431 Abnormal electrocardiogram [ECG] [EKG]: Secondary | ICD-10-CM | POA: Diagnosis present

## 2014-05-16 DIAGNOSIS — R7989 Other specified abnormal findings of blood chemistry: Secondary | ICD-10-CM

## 2014-05-16 LAB — IRON AND TIBC
IRON: 20 ug/dL — AB (ref 42–135)
Saturation Ratios: 8 % — ABNORMAL LOW (ref 20–55)
TIBC: 238 ug/dL — ABNORMAL LOW (ref 250–470)
UIBC: 218 ug/dL (ref 125–400)

## 2014-05-16 LAB — BASIC METABOLIC PANEL
ANION GAP: 16 — AB (ref 5–15)
Anion gap: 28 — ABNORMAL HIGH (ref 5–15)
Anion gap: 18 — ABNORMAL HIGH (ref 5–15)
Anion gap: 14 (ref 5–15)
Anion gap: 13 (ref 5–15)
Anion gap: 15 (ref 5–15)
Anion gap: 14 (ref 5–15)
BUN: 37 mg/dL — ABNORMAL HIGH (ref 6–23)
BUN: 39 mg/dL — ABNORMAL HIGH (ref 6–23)
BUN: 42 mg/dL — ABNORMAL HIGH (ref 6–23)
BUN: 45 mg/dL — AB (ref 6–23)
BUN: 47 mg/dL — ABNORMAL HIGH (ref 6–23)
BUN: 54 mg/dL — ABNORMAL HIGH (ref 6–23)
BUN: 57 mg/dL — AB (ref 6–23)
BUN: 58 mg/dL — AB (ref 6–23)
CALCIUM: 7.9 mg/dL — AB (ref 8.4–10.5)
CALCIUM: 7.8 mg/dL — AB (ref 8.4–10.5)
CALCIUM: 7.8 mg/dL — AB (ref 8.4–10.5)
CALCIUM: 7.8 mg/dL — AB (ref 8.4–10.5)
CHLORIDE: 115 meq/L — AB (ref 96–112)
CO2: 8 mEq/L — CL (ref 19–32)
CO2: 18 mEq/L — ABNORMAL LOW (ref 19–32)
CO2: 21 mEq/L (ref 19–32)
CO2: 19 meq/L (ref 19–32)
CO2: 21 meq/L (ref 19–32)
CO2: 20 meq/L (ref 19–32)
CO2: 21 meq/L (ref 19–32)
CREATININE: 1.35 mg/dL — AB (ref 0.50–1.10)
CREATININE: 1.3 mg/dL — AB (ref 0.50–1.10)
CREATININE: 1.08 mg/dL (ref 0.50–1.10)
CREATININE: 1.05 mg/dL (ref 0.50–1.10)
CREATININE: 1.03 mg/dL (ref 0.50–1.10)
Calcium: 7.5 mg/dL — ABNORMAL LOW (ref 8.4–10.5)
Calcium: 7.6 mg/dL — ABNORMAL LOW (ref 8.4–10.5)
Calcium: 7.7 mg/dL — ABNORMAL LOW (ref 8.4–10.5)
Calcium: 7.9 mg/dL — ABNORMAL LOW (ref 8.4–10.5)
Chloride: 105 mEq/L (ref 96–112)
Chloride: 106 mEq/L (ref 96–112)
Chloride: 111 mEq/L (ref 96–112)
Chloride: 112 mEq/L (ref 96–112)
Chloride: 113 mEq/L — ABNORMAL HIGH (ref 96–112)
Chloride: 114 mEq/L — ABNORMAL HIGH (ref 96–112)
Chloride: 115 mEq/L — ABNORMAL HIGH (ref 96–112)
Creatinine, Ser: 2.07 mg/dL — ABNORMAL HIGH (ref 0.50–1.10)
Creatinine, Ser: 2.37 mg/dL — ABNORMAL HIGH (ref 0.50–1.10)
Creatinine, Ser: 2.39 mg/dL — ABNORMAL HIGH (ref 0.50–1.10)
GFR calc Af Amer: 26 mL/min — ABNORMAL LOW (ref >90)
GFR calc Af Amer: 55 mL/min — ABNORMAL LOW (ref >90)
GFR calc Af Amer: 69 mL/min — ABNORMAL LOW (ref >90)
GFR calc Af Amer: 71 mL/min — ABNORMAL LOW (ref >90)
GFR calc Af Amer: 73 mL/min — ABNORMAL LOW (ref >90)
GFR calc non Af Amer: 23 mL/min — ABNORMAL LOW (ref >90)
GFR calc non Af Amer: 45 mL/min — ABNORMAL LOW (ref >90)
GFR calc non Af Amer: 47 mL/min — ABNORMAL LOW (ref >90)
GFR calc non Af Amer: 59 mL/min — ABNORMAL LOW (ref >90)
GFR calc non Af Amer: 61 mL/min — ABNORMAL LOW (ref >90)
GFR calc non Af Amer: 63 mL/min — ABNORMAL LOW (ref >90)
GFR, EST AFRICAN AMERICAN: 27 mL/min — AB (ref >90)
GFR, EST AFRICAN AMERICAN: 31 mL/min — AB (ref >90)
GFR, EST AFRICAN AMERICAN: 52 mL/min — AB (ref >90)
GFR, EST NON AFRICAN AMERICAN: 23 mL/min — AB (ref >90)
GFR, EST NON AFRICAN AMERICAN: 27 mL/min — AB (ref >90)
GLUCOSE: 116 mg/dL — AB (ref 70–99)
GLUCOSE: 141 mg/dL — AB (ref 70–99)
GLUCOSE: 145 mg/dL — AB (ref 70–99)
GLUCOSE: 662 mg/dL — AB (ref 70–99)
GLUCOSE: 729 mg/dL — AB (ref 70–99)
Glucose, Bld: 354 mg/dL — ABNORMAL HIGH (ref 70–99)
Glucose, Bld: 118 mg/dL — ABNORMAL HIGH (ref 70–99)
Glucose, Bld: 116 mg/dL — ABNORMAL HIGH (ref 70–99)
POTASSIUM: 4.7 meq/L (ref 3.7–5.3)
Potassium: 5.4 mEq/L — ABNORMAL HIGH (ref 3.7–5.3)
Potassium: 4.2 mEq/L (ref 3.7–5.3)
Potassium: 4.3 mEq/L (ref 3.7–5.3)
Potassium: 4.1 mEq/L (ref 3.7–5.3)
Potassium: 3.4 mEq/L — ABNORMAL LOW (ref 3.7–5.3)
Potassium: 3.6 mEq/L — ABNORMAL LOW (ref 3.7–5.3)
Potassium: 3.4 mEq/L — ABNORMAL LOW (ref 3.7–5.3)
SODIUM: 141 meq/L (ref 137–147)
SODIUM: 143 meq/L (ref 137–147)
SODIUM: 151 meq/L — AB (ref 137–147)
Sodium: 149 mEq/L — ABNORMAL HIGH (ref 137–147)
Sodium: 148 mEq/L — ABNORMAL HIGH (ref 137–147)
Sodium: 148 mEq/L — ABNORMAL HIGH (ref 137–147)
Sodium: 148 mEq/L — ABNORMAL HIGH (ref 137–147)
Sodium: 146 mEq/L (ref 137–147)

## 2014-05-16 LAB — CBC
HCT: 30.2 % — ABNORMAL LOW (ref 36.0–46.0)
HEMOGLOBIN: 9.2 g/dL — AB (ref 12.0–15.0)
MCH: 19.7 pg — ABNORMAL LOW (ref 26.0–34.0)
MCHC: 30.5 g/dL (ref 30.0–36.0)
MCV: 64.5 fL — ABNORMAL LOW (ref 78.0–100.0)
PLATELETS: 253 10*3/uL (ref 150–400)
RBC: 4.68 MIL/uL (ref 3.87–5.11)
RDW: 17.8 % — ABNORMAL HIGH (ref 11.5–15.5)
WBC: 25.3 10*3/uL — AB (ref 4.0–10.5)

## 2014-05-16 LAB — GLUCOSE, CAPILLARY
GLUCOSE-CAPILLARY: 531 mg/dL — AB (ref 70–99)
GLUCOSE-CAPILLARY: 552 mg/dL — AB (ref 70–99)
GLUCOSE-CAPILLARY: 299 mg/dL — AB (ref 70–99)
GLUCOSE-CAPILLARY: 217 mg/dL — AB (ref 70–99)
GLUCOSE-CAPILLARY: 146 mg/dL — AB (ref 70–99)
GLUCOSE-CAPILLARY: 108 mg/dL — AB (ref 70–99)
GLUCOSE-CAPILLARY: 95 mg/dL (ref 70–99)
GLUCOSE-CAPILLARY: 132 mg/dL — AB (ref 70–99)
GLUCOSE-CAPILLARY: 123 mg/dL — AB (ref 70–99)
GLUCOSE-CAPILLARY: 128 mg/dL — AB (ref 70–99)
Glucose-Capillary: 453 mg/dL — ABNORMAL HIGH (ref 70–99)
Glucose-Capillary: 414 mg/dL — ABNORMAL HIGH (ref 70–99)
Glucose-Capillary: 364 mg/dL — ABNORMAL HIGH (ref 70–99)
Glucose-Capillary: 296 mg/dL — ABNORMAL HIGH (ref 70–99)
Glucose-Capillary: 95 mg/dL (ref 70–99)
Glucose-Capillary: 116 mg/dL — ABNORMAL HIGH (ref 70–99)
Glucose-Capillary: 119 mg/dL — ABNORMAL HIGH (ref 70–99)
Glucose-Capillary: 122 mg/dL — ABNORMAL HIGH (ref 70–99)

## 2014-05-16 LAB — HEMOGLOBIN A1C
Hgb A1c MFr Bld: 9.5 % — ABNORMAL HIGH (ref <5.7)
Mean Plasma Glucose: 226 mg/dL — ABNORMAL HIGH (ref <117)

## 2014-05-16 LAB — BLOOD GAS, ARTERIAL
ACID-BASE DEFICIT: 8.1 mmol/L — AB (ref 0.0–2.0)
BICARBONATE: 16.7 meq/L — AB (ref 20.0–24.0)
Drawn by: 418751
FIO2: 0.28 %
O2 Saturation: 96.2 %
PCO2 ART: 32.9 mmHg — AB (ref 35.0–45.0)
PO2 ART: 81 mmHg (ref 80.0–100.0)
Patient temperature: 98.6
TCO2: 17.8 mmol/L (ref 0–100)
pH, Arterial: 7.327 — ABNORMAL LOW (ref 7.350–7.450)

## 2014-05-16 LAB — LACTIC ACID, PLASMA
LACTIC ACID, VENOUS: 2.3 mmol/L — AB (ref 0.5–2.2)
Lactic Acid, Venous: 4 mmol/L — ABNORMAL HIGH (ref 0.5–2.2)

## 2014-05-16 LAB — RETICULOCYTES
RBC.: 4.82 MIL/uL (ref 3.87–5.11)
RETIC COUNT ABSOLUTE: 43.4 10*3/uL (ref 19.0–186.0)
Retic Ct Pct: 0.9 % (ref 0.4–3.1)

## 2014-05-16 LAB — TROPONIN I
TROPONIN I: 0.82 ng/mL — AB (ref <0.30)
TROPONIN I: 2.68 ng/mL — AB (ref <0.30)
Troponin I: 0.5 ng/mL (ref <0.30)

## 2014-05-16 LAB — FOLATE

## 2014-05-16 LAB — VITAMIN B12: Vitamin B-12: >2000 pg/mL — ABNORMAL HIGH (ref 211–911)

## 2014-05-16 LAB — TSH: TSH: 1.7 u[IU]/mL (ref 0.350–4.500)

## 2014-05-16 LAB — FERRITIN: FERRITIN: 199 ng/mL (ref 10–291)

## 2014-05-16 LAB — MRSA PCR SCREENING: MRSA BY PCR: POSITIVE — AB

## 2014-05-16 MED ORDER — HEPARIN SODIUM (PORCINE) 5000 UNIT/ML IJ SOLN
5000.0000 [IU] | Freq: Three times a day (TID) | INTRAMUSCULAR | Status: DC
  Administered 2014-05-16 (×2): 5000 [IU] via SUBCUTANEOUS
  Filled 2014-05-16 (×4): qty 1

## 2014-05-16 MED ORDER — SODIUM CHLORIDE 0.45 % IV SOLN
INTRAVENOUS | Status: DC
  Administered 2014-05-16: 08:00:00 via INTRAVENOUS

## 2014-05-16 MED ORDER — CHLORHEXIDINE GLUCONATE 0.12 % MT SOLN
15.0000 mL | Freq: Two times a day (BID) | OROMUCOSAL | Status: DC
  Administered 2014-05-16 – 2014-05-22 (×11): 15 mL via OROMUCOSAL
  Filled 2014-05-16 (×14): qty 15

## 2014-05-16 MED ORDER — ASPIRIN 325 MG PO TABS
325.0000 mg | ORAL_TABLET | Freq: Once | ORAL | Status: DC
  Filled 2014-05-16: qty 1

## 2014-05-16 MED ORDER — SODIUM CHLORIDE 0.9 % IJ SOLN
3.0000 mL | INTRAMUSCULAR | Status: DC | PRN
  Administered 2014-05-16: 3 mL via INTRAVENOUS

## 2014-05-16 MED ORDER — SODIUM CHLORIDE 0.9 % IJ SOLN: 3.0000 mL | INTRAMUSCULAR | Status: DC | PRN

## 2014-05-16 MED ORDER — METOPROLOL TARTRATE 25 MG/10 ML ORAL SUSPENSION
12.5000 mg | Freq: Two times a day (BID) | ORAL | Status: DC
  Administered 2014-05-16 – 2014-05-20 (×7): 12.5 mg via ORAL
  Filled 2014-05-16 (×13): qty 5

## 2014-05-16 MED ORDER — SODIUM CHLORIDE 0.9 % IV SOLN: INTRAVENOUS | Status: DC

## 2014-05-16 MED ORDER — CETYLPYRIDINIUM CHLORIDE 0.05 % MT LIQD
7.0000 mL | Freq: Two times a day (BID) | OROMUCOSAL | Status: DC
  Administered 2014-05-16 – 2014-05-22 (×12): 7 mL via OROMUCOSAL

## 2014-05-16 MED ORDER — ASPIRIN 300 MG RE SUPP
300.0000 mg | Freq: Once | RECTAL | Status: AC
  Administered 2014-05-16: 300 mg via RECTAL
  Filled 2014-05-16: qty 1

## 2014-05-16 MED ORDER — POTASSIUM CHLORIDE 20 MEQ/15ML (10%) PO LIQD
40.0000 meq | Freq: Once | ORAL | Status: AC
  Administered 2014-05-16: 40 meq
  Filled 2014-05-16: qty 30

## 2014-05-16 MED ORDER — NORMAL SALINE NICU FLUSH: 0.5000 mL | INTRAVENOUS | Status: DC | PRN

## 2014-05-16 MED ORDER — HEPARIN (PORCINE) IN NACL 100-0.45 UNIT/ML-% IJ SOLN
750.0000 [IU]/h | INTRAMUSCULAR | Status: DC
  Administered 2014-05-16: 650 [IU]/h via INTRAVENOUS
  Filled 2014-05-16 (×2): qty 250

## 2014-05-16 MED ORDER — IPRATROPIUM-ALBUTEROL 0.5-2.5 (3) MG/3ML IN SOLN: 3.0000 mL | RESPIRATORY_TRACT | Status: DC | PRN

## 2014-05-16 MED ORDER — ATORVASTATIN CALCIUM 80 MG PO TABS
80.0000 mg | ORAL_TABLET | Freq: Every day | ORAL | Status: DC
Start: 2014-05-16 — End: 2014-05-22
  Administered 2014-05-16 – 2014-05-21 (×6): 80 mg via ORAL
  Filled 2014-05-16 (×7): qty 1

## 2014-05-16 MED ORDER — POTASSIUM CHLORIDE 10 MEQ/100ML IV SOLN: 10.0000 meq | INTRAVENOUS | Status: DC

## 2014-05-16 MED ORDER — METOPROLOL TARTRATE 12.5 MG HALF TABLET
12.5000 mg | ORAL_TABLET | Freq: Two times a day (BID) | ORAL | Status: DC
  Administered 2014-05-20 – 2014-05-22 (×4): 12.5 mg via ORAL
  Filled 2014-05-16 (×13): qty 1

## 2014-05-16 MED ORDER — MUPIROCIN 2 % EX OINT
1.0000 "application " | TOPICAL_OINTMENT | Freq: Two times a day (BID) | CUTANEOUS | Status: AC
  Administered 2014-05-16 – 2014-05-20 (×10): 1 via NASAL
  Filled 2014-05-16: qty 22

## 2014-05-16 MED ORDER — DEXTROSE-NACL 5-0.45 % IV SOLN
INTRAVENOUS | Status: DC
  Administered 2014-05-16 (×2): via INTRAVENOUS

## 2014-05-16 MED ORDER — ASPIRIN EC 81 MG PO TBEC
81.0000 mg | DELAYED_RELEASE_TABLET | Freq: Every day | ORAL | Status: DC
  Administered 2014-05-17 – 2014-05-22 (×6): 81 mg via ORAL
  Filled 2014-05-16 (×7): qty 1

## 2014-05-16 MED ORDER — SODIUM CHLORIDE 0.9 % IV SOLN
INTRAVENOUS | Status: DC
  Administered 2014-05-16: 3.4 [IU]/h via INTRAVENOUS
  Administered 2014-05-16: 4.7 [IU]/h via INTRAVENOUS
  Administered 2014-05-17: 1.1 [IU]/h via INTRAVENOUS
  Filled 2014-05-16 (×3): qty 2.5

## 2014-05-16 MED ORDER — STERILE WATER FOR INJECTION IV SOLN
INTRAVENOUS | Status: DC
  Administered 2014-05-16: 02:00:00 via INTRAVENOUS
  Filled 2014-05-16 (×2): qty 850

## 2014-05-16 MED ORDER — DEXTROSE-NACL 5-0.9 % IV SOLN
INTRAVENOUS | Status: DC
  Administered 2014-05-17: 08:00:00 via INTRAVENOUS
  Administered 2014-05-17: 1 mL via INTRAVENOUS
  Administered 2014-05-18: 75 mL/h via INTRAVENOUS

## 2014-05-16 MED ORDER — METOPROLOL TARTRATE 12.5 MG HALF TABLET
12.5000 mg | ORAL_TABLET | Freq: Two times a day (BID) | ORAL | Status: DC
  Filled 2014-05-16: qty 1

## 2014-05-16 MED ORDER — CHLORHEXIDINE GLUCONATE CLOTH 2 % EX PADS
6.0000 | MEDICATED_PAD | Freq: Every day | CUTANEOUS | Status: AC
  Administered 2014-05-16 – 2014-05-20 (×3): 6 via TOPICAL

## 2014-05-16 MED ORDER — DEXTROSE 50 % IV SOLN
25.0000 mL | INTRAVENOUS | Status: DC | PRN
  Filled 2014-05-16: qty 50

## 2014-05-16 NOTE — Progress Notes (Signed)
CRITICAL VALUE ALERT  Critical value received: troponin I  Date of notification:  05/15/2014  Time of notification:  1427  Critical value read back:Yes.    Nurse who received alert:  Harlow AsaLisa Makylee Sanborn, RN  MD notified (1st page):  DR. Lauris ChromanWoody Jones  Time of first page:  1455  MD notified (2nd page):  Time of second page:  Responding MD:  Dr. Lauris ChromanWoody Jones  Time MD responded:  [id number]

## 2014-05-16 NOTE — Plan of Care (Signed)
Problem: Phase I Progression Outcomes Goal: CBGs steadily decreasing on IV insulin drip Outcome: Progressing  CBGs are steadily decreasing. Last cbg 146. Goal: Monitor hydration status Outcome: Progressing Urine output 40-60 ml/hr. 0.45% NS infusing at 125 ml/hr
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
## 2014-05-16 NOTE — Progress Notes (Signed)
Dr. Yetta BarreJones notified of troponin level 0.82 at 1455. Also made aware the 1330 BMP was still pending. Notified patient vomited approximately 100 ml green (bile like) fluid prior to trach being changed. Patient was oral suctioned while vomiting. After trach changed patient was suctioned by RT and greenish sputum was obtained. Dr. Yetta BarreJones aware. BMP results called to Dr. Elliot GurneyWoody at [phone] after printed copy of labs obtained from lab. Results still not showing under results review or chart review/labs.

## 2014-05-16 NOTE — H&P (Signed)
Pt seen and examined. Please refer to resident note for details  In brief, 48 y/o female with PMH of type 1 DM, HTN, s/p trach/PEG following substance abuse, hypothyroidism who p/w AMS. Pt was discharged from SNF and lives with her mother. Per mother she was refusing insulin and was altered several hours prior to coming to ED. Pt able tonod or shake her head to questions but is unable to give history at this time. No fevers, no chills, no abd pain, no diarrhea. Pt with 1 episode of bilious vomiting today ad tracheal aspirate + for greenish sputum. In ED BS >600  Exam: Gen: asleep but arousable, follows commands CVS: tachycardic,normal heart sounds Pulm: CTA b/l Abd: soft, non tender, BS+ Ext: no edema  Assessment and Plan: 48 y/o female with AMS likely secondary to DKA  Acute encephalopathy secondary to DKA: - BS improving insulin gtt - AG is narrowing but still not normalized - c/w DKA protocol for now - NPO, c/w IVF (d51/2 NS currently) - f/u blood cx, sputum cx - TSH wnl  Acute metabolic acidosis: - secondary to ketoacidosis - Improving now - HCO3 gtt dc'd. Will monitor on d51/2NS  AKI: - Improving with IVF- likely prerenal.  Will monitor - Pt with severe hyperkalemia and EKG changes on admission - EKG changes resolved and K now normalized

## 2014-05-16 NOTE — Consult Note (Signed)
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 disease.   Electronically Signed   By: Maryclare Bean M.D.   On: 05/15/2014 18:34    Review of Systems  Unable to perform ROS: mental acuity   Blood pressure 126/80, pulse 104, temperature 98.3 F (36.8 C), temperature source Axillary, resp. rate 18, height 5' 1" (1.549 m), weight 51.6 kg (113 lb 12.1 oz), SpO2 100.00%. Physical Exam  Nursing note and vitals reviewed. Constitutional: She is oriented to person, place, and time. No distress.  Altered mental status, thin  HENT:  Nose: Nose normal.  Mouth/Throat: Oropharynx is clear and moist. No oropharyngeal exudate.  tracheostomy  Eyes: Conjunctivae and EOM are normal. Pupils are equal, round, and reactive to light. No scleral icterus.  Neck: Normal range of motion. Neck supple. No JVD present.  Cardiovascular: Regular rhythm, normal heart sounds and intact distal pulses.  Exam reveals no gallop.   No murmur  heard. tachycardic  Respiratory: Effort normal and breath sounds normal. No respiratory distress. She has no rales.  GI: Soft. Bowel sounds are normal. She exhibits no distension. There is no tenderness. There is no rebound.  PEG  Musculoskeletal: Normal range of motion. She exhibits no edema and no tenderness.  Neurological: She is alert and oriented to person, place, and time. No cranial nerve deficit. Coordination normal.  Skin: Skin is warm and dry. No rash noted. No erythema.  Psychiatric:  Altered mental status   Labs reviewed above; Trop 2.69, h/h 9.2/30.2, plt 253, na 148, K 3.4, bun/cr 42/1.08 ECG reviewed; NSR, nonspecific ST changes  Assessment/Plan: Problem List 1. Elevated Troponin, NSTEMI 2. DKA 3. Hypertension 4. Dyslipidemia 5. Metabolic Acidosis 6. Anemia [name] is a 48 yo woman with PMH of T1DM, respiratory failure s/p trach/PEG related to substance abuse per chart review, dyslipidemia, hypertension, hypothyroidism, anemia who presented with altered mental status and  found to have DKA and elevated Troponin. Differential diagnosis is Type II vs. Type I NSTEMI. NSTEMI is known precipitant of DKA, however, it sounds like [name] has been noncompliant. She had significant risk factors with prolonged diabetes, hypertension, dyslipidemia, prior tobacco use. Given significant risk factors and long-standing diabetes, I believe she has underlying CAD. Without ability to further stratify story and no obvious reasons not to anticoagulate, I will start heparin gtt per pharmacy. Once clinically more stable, ischemic evaluation warranted. As more clinical data gathered, as long as revascularization is an option, I favor LHC when stable.  - obtain echocardiogram - aspirin 324 mg now and 81 mg daily  - heparin gtt per pharmacy, atorvastatin - metoprolol 12.5 mg bid - hba1c, tsh, BNP, Mg, lipid panel  ,  05/16/2014, 8:08 PM

## 2014-05-16 NOTE — Progress Notes (Signed)
Patient ID: [name], female   DOB: 1966/06/28, 48 y.o.   MRN: [id number]   Subjective:  [name] is difficult to arouse Nicole morning. She responds to verbal commands, and only minimally responds verbally. She indicates that she is not in any pain.  Objective:  Vital signs in last 24 hours:    Filed Vitals:   05/16/14 0630 05/16/14 0700 05/16/14 0800 05/16/14 0815  BP: 110/64 110/62 108/61   Pulse: 108 107 106 102  Temp:  98.2 F (36.8 C)    TempSrc:  Axillary    Resp: 20 21 17 18   SpO2: 98% 94% 100% 100%   I/O last 3 completed shifts: In: 3245.5 [I.V.:2645.5; Other:600] Out: 1450 [Urine:1450] Total I/O In: 200.4 [I.V.:200.4] Out: 110 [Urine:110]  ABG    Component Value Date/Time   PHART 7.327* 05/16/2014 0512   PCO2ART 32.9* 05/16/2014 0512   PO2ART 81.0 05/16/2014 0512   HCO3 16.7* 05/16/2014 0512   TCO2 17.8 05/16/2014 0512   ACIDBASEDEF 8.1* 05/16/2014 0512   O2SAT 96.2 05/16/2014 0512     CBC Latest Ref Rng 05/16/2014 05/15/2014 01/21/2014  WBC 4.0 - 10.5 K/uL 25.3(H) 28.0(H) 4.2  Hemoglobin 12.0 - 15.0 g/dL 7.8(G9.2(L) 10.3(L) 11.1(L)  Hematocrit 36.0 - 46.0 % 30.2(L) 34.1(L) 36.2  Platelets 150 - 400 K/uL 253 382 207   BMET    Component Value Date/Time   NA 151* 05/16/2014 0545   K 4.2 05/16/2014 0545   CL 115* 05/16/2014 0545   CO2 18* 05/16/2014 0545   GLUCOSE 354* 05/16/2014 0545   BUN 54* 05/16/2014 0545   CREATININE 2.07* 05/16/2014 0545   CREATININE 0.78 03/19/2012 1015   CALCIUM 7.9* 05/16/2014 0545   GFRNONAA 27* 05/16/2014 0545   GFRNONAA >89 03/19/2012 1015   GFRAA 31* 05/16/2014 0545   GFRAA >89 03/19/2012 1015    Drugs of Abuse     Component Value Date/Time   LABOPIA NONE DETECTED 05/15/2014 2100   COCAINSCRNUR NONE DETECTED 05/15/2014 2100   LABBENZ NONE DETECTED 05/15/2014 2100   AMPHETMU NONE DETECTED 05/15/2014 2100   THCU NONE DETECTED 05/15/2014 2100   LABBARB NONE DETECTED 05/15/2014 2100      Physical Exam General: Mildly  cachectic Lying in bed with eyes closed in no apparent distress HEENT:  Mildly dry mucous membranes. Resists eye opening. Pupils somewhat sluggish. Anicteric sclera Neck:  Trach in place. Minimal yellow discharge around site.  Cardiac: Tachycardic rate. Normal rhythm. No m/r/g Pulm: Trach in place. Clear to auscultation bilaterally Abd: PEG in place without surrounding erythema  Ext: Warm and well perfused without edema. Neuro: Somnolent, minimally responds to commands. Minimally responds to questions with single word answers. Localizes to noxious stimuli.    Assessment/Plan:  [name] is a 48 yo with a h/o T1DM, substance abuse who presents in severe DKA with BG >600.   Assessment & Plan by Problem:  Principal Problem:  DKA (diabetic ketoacidosis)  Active Problems:  Diabetes mellitus type 1, uncontrolled, insulin dependent  HLD (hyperlipidemia)  Microcytic anemia  Substance abuse  Mental disorder  Hypotension  Metabolic acidosis with respiratory alkalosis  Acute encephalopathy  EKG abnormalities   DKA w/ acute encephalopathy: Most recent BG is 217. She has a persistent metabolic acidosis with a pH of 7.32 and gap of 18.  - Transition to D5 1/2NS now that her BG is under 250 - Once two normal anion gaps and continued downtrending glucose, transition to [employer] lantus - telemetry  - DKA protocol: BMP  Q2hr, D5 1/2NS @125ml /hr when CBG <250, insulin gtt, CBG Q1hr.  - It has been difficult to obtain blood, likely due to collapsed veins. Will order foot sticks. PICC line possible if this continues to be a problem, given necessity of frequent monitoring. - NPO  - Blood culture pending  - TSH Normal  - Hgb A1c pending  Metabolic acidosis with compensatory resp alkalosis: AG is closing, from 28 to 18 this morning. Serum bicarb <7 on presentation and now at 8. Lactate downtrending from 4.0 to 2. Due to DKA and resolving lactic acidosis.  - Maintain bicarb drip at 100cc/hr - Checking BMP  q2h.  - Lactic acid q6h  AKI: Cr 2.76 on admission. Baseline around 0.6. Likely 2/2 volume depletion as evidenced by hypotension on admission. She received 4L NS in Nicole ED. Improving with fluid resuscitation with Cr now at 2.07. Her urine output has been adequate. - Fluid resuscitation as above  - Continue to monitor  - avoid nephrotoxins  Hypotension: SBP in 80s on admission. Pt very dry on admission in Nicole setting of DKA. Now normotensive at 104/60.  - home lisinopril 2.5mg  daily held  Diffuse ST depressions/QTc prolongation: New on EKG. No active chest pain. ST depressions resolved on repeat EKG. Trop neg x1. QTc 543ms to 517ms on repeat.  - Recent troponin of 0.5 - Patient has home regimen of seroquel. Ziprasidone, and methadone which are all QTc prolonging. Avoiding QTc prolonging meds in hospital and will address this upon discharge. SOB, cough: history of respiratory failure s/p trach. CXR without acute abnormality.  - Duoneb 3ml Q4hr prn wheezing, shortness of breath  - Continuous pulse ox  Microcytic anemia: Downtrending from Hgb 10.3 admission to 9.2 this morning. Baseline around 10-12. Previous anemia panel in '13 with low iron and saturation ratios, but normal TIBC and low normal ferritin consistent with anemia of chronic disease. Pt on ferrous sulfate 325mg  daily at home, which was held while NPO.  - Anemia nemia panel pending - Daily CBC  Mental disorder  - home klonopin 0.5 BID prn anxiety held while altered mental status  - HOLD home seroquel 50mg  daily - HOLD home sertraline 100mg  QHS  - HOLD home geodon 20mg  BID  DTV PPx: [place] Heparin 5000u TID  Dispo: Disposition is deferred at this time, awaiting improvement of current medical problems.   Anticipated discharge in approximately 2-3 day(s).  Nicole patient does have a current PCP (Rich Numberarly [name]) and [facility] after discharge.  Nicole patient does not have transportation limitations that  hinder transportation to clinic appointments.

## 2014-05-16 NOTE — Progress Notes (Signed)
  I have seen and examined the patient myself, and I have reviewed the note by Ruben ImJeremy Ford, MS 4 and was present during the interview and physical exam.  Please see my separate H&P for additional findings, assessment, and plan.   Signed: Courtney ParisEden W Raylon Lamson, MD 05/16/2014, 2:27 PM

## 2014-05-16 NOTE — Procedures (Signed)
Tracheostomy Change Note  Patient Details:   Name: [name] DistanceLenora Vogel DOB: [birth year] MRN: [id number]    Airway Documentation:     Evaluation  O2 sats: stable throughout Complications: No apparent complications Patient did tolerate procedure well. Bilateral Breath Sounds: Rhonchi;Diminished Suctioning: Airway;Oral  Armando GangMike, Ronneisha Jett C 05/16/2014, 2:58 PM

## 2014-05-16 NOTE — Progress Notes (Signed)
Subjective: Patient seen at bedside this AM. Able to answer simple yes or no questions, following commands. Labs improved.   Objective: Vital signs in last 24 hours: Filed Vitals:   05/16/14 1100 05/16/14 1200 05/16/14 1300 05/16/14 1400  BP: 112/57 113/70 134/71 120/66  Pulse: 100  95   Temp: 97.1 F (36.2 C)     TempSrc: Axillary     Resp: 33 21 20 18   SpO2: 100%  100%    Weight change:   Intake/Output Summary (Last 24 hours) at 05/16/14 1429 Last data filed at 05/16/14 1400  Gross per 24 hour  Intake 4123.18 ml  Output   1680 ml  Net 2443.18 ml   Physical Exam: General: Lethargic, not speaking, trach in place.  HEENT: PERRL, EOMI. Moist mucus membranes. Trach w/ O2 via collar.  Neck: Full range of motion without pain, supple, no lymphadenopathy or carotid bruits Lungs: Scattered coarse breath sounds, otherwise clear to auscultation. No wheezing.  Heart: Tachycardic, no murmurs, gallops, or rubs Abdomen: Soft, non-tender, non-distended, BS +. Left-sided PEG tube in place. Clean, dry, intact.  Extremities: No cyanosis, clubbing, or edema Neurologic: Alert yet lethargic, orientation not able to be assessed. Cranial nerves generally intact, strength grossly intact, sensation intact to light touch   Lab Results: Basic Metabolic Panel:  Recent Labs Lab 05/16/14 0140 05/16/14 0545  NA 141 151*  K 5.4* 4.2  CL 105 115*  CO2 8* 18*  GLUCOSE 662* 354*  BUN 58* 54*  CREATININE 2.37* 2.07*  CALCIUM 7.6* 7.9*   Liver Function Tests:  Recent Labs Lab 05/15/14 1725  AST 24  ALT 12  ALKPHOS 101  BILITOT 0.2*  PROT 8.5*  ALBUMIN 4.4   CBC:  Recent Labs Lab 05/15/14 1753 05/16/14 0140  WBC 28.0* 25.3*  NEUTROABS 22.9*  --   HGB 10.3* 9.2*  HCT 34.1* 30.2*  MCV 65.5* 64.5*  PLT 382 253   Cardiac Enzymes:  Recent Labs Lab 05/16/14 0140  TROPONINI 0.50*   CBG:  Recent Labs Lab 05/16/14 0316 05/16/14 0421 05/16/14 0514 05/16/14 0604  05/16/14 0653 05/16/14 0726  GLUCAP 453* 414* 364* 299* 296* 217*    Thyroid Function Tests:  Recent Labs Lab 05/16/14 0545  TSH 1.700   Anemia Panel:  Recent Labs Lab 05/16/14 1015  RETICCTPCT 0.9   Urine Drug Screen: Drugs of Abuse     Component Value Date/Time   LABOPIA NONE DETECTED 05/15/2014 2100   COCAINSCRNUR NONE DETECTED 05/15/2014 2100   LABBENZ NONE DETECTED 05/15/2014 2100   AMPHETMU NONE DETECTED 05/15/2014 2100   THCU NONE DETECTED 05/15/2014 2100   LABBARB NONE DETECTED 05/15/2014 2100    Urinalysis:  Recent Labs Lab 05/15/14 2100  COLORURINE YELLOW  LABSPEC 1.022  PHURINE 5.5  GLUCOSEU >1000*  HGBUR SMALL*  BILIRUBINUR NEGATIVE  KETONESUR >80*  PROTEINUR NEGATIVE  UROBILINOGEN 0.2  NITRITE NEGATIVE  LEUKOCYTESUR NEGATIVE    Micro Results: Recent Results (from the past 240 hour(s))  MRSA PCR SCREENING     Status: Abnormal   Collection Time    05/16/14  1:36 AM      Result Value Ref Range Status   MRSA by PCR POSITIVE (*) NEGATIVE Final   Comment:            The GeneXpert MRSA Assay (FDA     approved for NASAL specimens     only), is one component of a     comprehensive MRSA colonization  surveillance program. It is not     intended to diagnose MRSA     infection nor to guide or     monitor treatment for     MRSA infections.     RESULT CALLED TO, READ BACK BY AND VERIFIED WITH:     CALLED TO RN AVERY DANIEL [phone] @0445  THANEY   Studies/Results: Ct Head Wo Contrast  05/15/2014   CLINICAL DATA:  Altered mental status.  EXAM: CT HEAD WITHOUT CONTRAST  TECHNIQUE: Contiguous axial images were obtained from the base of the skull through the vertex without intravenous contrast.  COMPARISON:  None.  FINDINGS: No intra-axial or extra-axial pathologic fluid or blood collection. No mass lesion. No hydrocephalus. No hemorrhage. No acute bony abnormality  IMPRESSION: No acute abnormality.   Electronically Signed   By: Maisie Fushomas  Register    On: 05/15/2014 19:30   Dg Chest Port 1 View  05/15/2014   CLINICAL DATA:  Altered mental status for 1 day. Patient has a mental illness. Unresponsive today.  EXAM: PORTABLE CHEST - 1 VIEW  COMPARISON:  01/18/2013  FINDINGS: Tracheostomy tube in place. Tip is 2.1 cm from the carina. Upper normal heart size. Clear lungs.  IMPRESSION: Tracheostomy tube in place.  No active cardiopulmonary disease.   Electronically Signed   By: Maryclare BeanArt  Hoss M.D.   On: 05/15/2014 18:34    Medications: I have reviewed the patient's current medications. Scheduled Meds: . antiseptic oral rinse  7 mL Mouth Rinse q12n4p  . chlorhexidine  15 mL Mouth Rinse BID  . Chlorhexidine Gluconate Cloth  6 each Topical Q0600  . heparin  5,000 Units Subcutaneous 3 times per day  . mupirocin ointment  1 application Nasal BID   Continuous Infusions: . sodium chloride Stopped (05/16/14 1006)  . dextrose 5 % and 0.45% NaCl 125 mL/hr at 05/16/14 1007  . insulin (NOVOLIN-R) infusion 2.8 Units/hr (05/16/14 1333)   PRN Meds:.dextrose, ipratropium-albuterol, sodium chloride, sodium chloride  Assessment/Plan: 48 y/o F w/ PMHx of DM type I, microcytic anemia, HLD, CAD, HTN, trach and PEG dependent, w/ h/o polysubstance abuse, admitted for severe DKA.   DKA w/ Acute Encephalopathy: Patient presented w/ altered mental status and severe DKA. Initial blood glucose of 1088 and anion gap not able to be calculated w/ HCO3 of <7. Ketones in urine and lactic acidosis of 5.23 on admission as well. Started on insulin gtt and sodium bicarbonate gtt. Labs improved throughout the day, most recent HCO3 21, AG 16, and blood glucose of 118. Mental status also somewhat improved throughout the course of the day. TSH normal, HbA1c pending. Etiology of DKA possibly related to infectious source given RT reports of greenish sputum from trach and significant leukocytosis, however, CXR negative on admission. May also be related to non-compliance.  -Continue Insulin  gtt until AG closed x2 -Continue D5 1/2NS @ 125 cc/hr for now -BMP q2h -NPO -Consider transition to SQ insulin tomorrow (uses Lantus 7 units bid + ISS at home).   Anion Gap Metabolic Acidosis w/ Respiratory Compensation: Improving. Tachypnea improved, HCO3 increasing, AG closing slowly. Most likely d/t DKA in addition to lactic acidosis on admission (5.23 initially, now also decreasing).  -Continue to monitor via BMP q2h -IVF as above  NSTEMI: Patient w/ mild elevation in troponin, as high as 0.80 most recently. No significant EKG changes suggesting ischemia.  -Continue to cycle troponins (x1). -Repeat EKG in AM -Will discuss w/ cardiology and formally consult if troponin continues to increase.   Leukocytosis:  Most likely leukemoid reaction in the setting of DKA, however, some possibility of infectious etiology given increased sputum production from tracheostomy. CXR shows NACPD. Trach changed today. -Respiratory culture pending -Blood cultures pedning  AKI: Improving. Most recent Cr 1.35, decreased from 2.76 on admission.  -IVF as above -BMP q2h  Hypotension: Resolved. Most likely initially 2/2 dehydration and volume depletion in the setting of DKA.  -Continue to monitor -IVF as above  Microcytic anemia: Stable. Hb 9.2 most recently. Previous anemia panel in '13 with low iron and saturation ratios, but normal TIBC and low normal ferritin consistent with anemia of chronic disease. Patient on ferrous sulfate 325 mg daily at home. -Anemia panel pending -CBC in AM   Mental disorder: Stable. Baseline mental status unclear at this time.  -Home klonopin 0.5 BID prn anxiety held while altered mental status  -Home seroquel 50mg  daily held  -Home sertraline 100mg  QHS held  -Home geodon 20mg  BID held   H/o Substance Abuse on Methadone: Given poor mental status, will hold methadone for now.  -Restart methadone if showing signs of withdrawal or improved mental status.   DTV PPx: Heparin  SQ  Dispo: Disposition is deferred at this time, awaiting improvement of current medical problems.  Anticipated discharge in approximately 2-3 day(s).   The patient does have a current PCP (Rich Numberarly Rivet, MD) and [facility] after [facility].  The patient does not have transportation limitations that hinder transportation to clinic appointments.  .Services Needed at time of discharge: Y = Yes, Blank = No PT:   OT:   RN:   Equipment:   Other:     LOS: 1 day   Courtney ParisEden W Chrysta Fulcher, MD 05/16/2014, 2:29 PM

## 2014-05-17 ENCOUNTER — Inpatient Hospital Stay (HOSPITAL_COMMUNITY): Payer: Medicaid Other

## 2014-05-17 DIAGNOSIS — I214 Non-ST elevation (NSTEMI) myocardial infarction: Secondary | ICD-10-CM | POA: Diagnosis present

## 2014-05-17 DIAGNOSIS — E1010 Type 1 diabetes mellitus with ketoacidosis without coma: Secondary | ICD-10-CM | POA: Diagnosis present

## 2014-05-17 LAB — GLUCOSE, CAPILLARY
GLUCOSE-CAPILLARY: 103 mg/dL — AB (ref 70–99)
GLUCOSE-CAPILLARY: 108 mg/dL — AB (ref 70–99)
GLUCOSE-CAPILLARY: 88 mg/dL (ref 70–99)
GLUCOSE-CAPILLARY: 118 mg/dL — AB (ref 70–99)
GLUCOSE-CAPILLARY: 138 mg/dL — AB (ref 70–99)
GLUCOSE-CAPILLARY: 116 mg/dL — AB (ref 70–99)
GLUCOSE-CAPILLARY: 80 mg/dL (ref 70–99)
GLUCOSE-CAPILLARY: 66 mg/dL — AB (ref 70–99)
GLUCOSE-CAPILLARY: 144 mg/dL — AB (ref 70–99)
GLUCOSE-CAPILLARY: 175 mg/dL — AB (ref 70–99)
Glucose-Capillary: 111 mg/dL — ABNORMAL HIGH (ref 70–99)
Glucose-Capillary: 118 mg/dL — ABNORMAL HIGH (ref 70–99)
Glucose-Capillary: 121 mg/dL — ABNORMAL HIGH (ref 70–99)
Glucose-Capillary: 122 mg/dL — ABNORMAL HIGH (ref 70–99)
Glucose-Capillary: 123 mg/dL — ABNORMAL HIGH (ref 70–99)
Glucose-Capillary: 131 mg/dL — ABNORMAL HIGH (ref 70–99)
Glucose-Capillary: 136 mg/dL — ABNORMAL HIGH (ref 70–99)
Glucose-Capillary: 139 mg/dL — ABNORMAL HIGH (ref 70–99)
Glucose-Capillary: 104 mg/dL — ABNORMAL HIGH (ref 70–99)
Glucose-Capillary: 103 mg/dL — ABNORMAL HIGH (ref 70–99)
Glucose-Capillary: 117 mg/dL — ABNORMAL HIGH (ref 70–99)
Glucose-Capillary: 119 mg/dL — ABNORMAL HIGH (ref 70–99)
Glucose-Capillary: 141 mg/dL — ABNORMAL HIGH (ref 70–99)
Glucose-Capillary: 147 mg/dL — ABNORMAL HIGH (ref 70–99)
Glucose-Capillary: 167 mg/dL — ABNORMAL HIGH (ref 70–99)
Glucose-Capillary: 81 mg/dL (ref 70–99)
Glucose-Capillary: 98 mg/dL (ref 70–99)
Glucose-Capillary: 115 mg/dL — ABNORMAL HIGH (ref 70–99)
Glucose-Capillary: 66 mg/dL — ABNORMAL LOW (ref 70–99)

## 2014-05-17 LAB — BASIC METABOLIC PANEL
Anion gap: 12 (ref 5–15)
Anion gap: 14 (ref 5–15)
Anion gap: 14 (ref 5–15)
Anion gap: 15 (ref 5–15)
Anion gap: 15 (ref 5–15)
Anion gap: 17 — ABNORMAL HIGH (ref 5–15)
BUN: 12 mg/dL (ref 6–23)
BUN: 12 mg/dL (ref 6–23)
BUN: 16 mg/dL (ref 6–23)
BUN: 21 mg/dL (ref 6–23)
BUN: 28 mg/dL — ABNORMAL HIGH (ref 6–23)
BUN: 34 mg/dL — ABNORMAL HIGH (ref 6–23)
CALCIUM: 7.5 mg/dL — AB (ref 8.4–10.5)
CALCIUM: 7.8 mg/dL — AB (ref 8.4–10.5)
CALCIUM: 7.9 mg/dL — AB (ref 8.4–10.5)
CHLORIDE: 111 meq/L (ref 96–112)
CHLORIDE: 112 meq/L (ref 96–112)
CHLORIDE: 108 meq/L (ref 96–112)
CO2: 20 mEq/L (ref 19–32)
CO2: 20 mEq/L (ref 19–32)
CO2: 20 meq/L (ref 19–32)
CO2: 21 mEq/L (ref 19–32)
CO2: 22 meq/L (ref 19–32)
CO2: 24 meq/L (ref 19–32)
CREATININE: 0.97 mg/dL (ref 0.50–1.10)
CREATININE: 0.73 mg/dL (ref 0.50–1.10)
Calcium: 7.5 mg/dL — ABNORMAL LOW (ref 8.4–10.5)
Calcium: 7.7 mg/dL — ABNORMAL LOW (ref 8.4–10.5)
Calcium: 7.3 mg/dL — ABNORMAL LOW (ref 8.4–10.5)
Chloride: 107 mEq/L (ref 96–112)
Chloride: 109 mEq/L (ref 96–112)
Chloride: 103 mEq/L (ref 96–112)
Creatinine, Ser: 0.86 mg/dL (ref 0.50–1.10)
Creatinine, Ser: 0.69 mg/dL (ref 0.50–1.10)
Creatinine, Ser: 0.62 mg/dL (ref 0.50–1.10)
Creatinine, Ser: 0.69 mg/dL (ref 0.50–1.10)
GFR calc Af Amer: 78 mL/min — ABNORMAL LOW (ref >90)
GFR calc Af Amer: >90 mL/min (ref >90)
GFR calc Af Amer: >90 mL/min (ref >90)
GFR calc Af Amer: >90 mL/min (ref >90)
GFR calc Af Amer: >90 mL/min (ref >90)
GFR calc Af Amer: >90 mL/min (ref >90)
GFR calc non Af Amer: 67 mL/min — ABNORMAL LOW (ref >90)
GFR calc non Af Amer: 78 mL/min — ABNORMAL LOW (ref >90)
GFR calc non Af Amer: >90 mL/min (ref >90)
GFR calc non Af Amer: >90 mL/min (ref >90)
GFR calc non Af Amer: >90 mL/min (ref >90)
GFR calc non Af Amer: >90 mL/min (ref >90)
GLUCOSE: 133 mg/dL — AB (ref 70–99)
GLUCOSE: 152 mg/dL — AB (ref 70–99)
GLUCOSE: 65 mg/dL — AB (ref 70–99)
GLUCOSE: 118 mg/dL — AB (ref 70–99)
GLUCOSE: 126 mg/dL — AB (ref 70–99)
GLUCOSE: 111 mg/dL — AB (ref 70–99)
POTASSIUM: 3.3 meq/L — AB (ref 3.7–5.3)
Potassium: 3.9 mEq/L (ref 3.7–5.3)
Potassium: 3.9 mEq/L (ref 3.7–5.3)
Potassium: 4 mEq/L (ref 3.7–5.3)
Potassium: 4 mEq/L (ref 3.7–5.3)
Potassium: 6.8 mEq/L (ref 3.7–5.3)
SODIUM: 143 meq/L (ref 137–147)
SODIUM: 141 meq/L (ref 137–147)
Sodium: 143 mEq/L (ref 137–147)
Sodium: 144 mEq/L (ref 137–147)
Sodium: 145 mEq/L (ref 137–147)
Sodium: 148 mEq/L — ABNORMAL HIGH (ref 137–147)

## 2014-05-17 LAB — CBC
HCT: 31.5 % — ABNORMAL LOW (ref 36.0–46.0)
Hemoglobin: 10.5 g/dL — ABNORMAL LOW (ref 12.0–15.0)
MCH: 20.4 pg — ABNORMAL LOW (ref 26.0–34.0)
MCHC: 33.3 g/dL (ref 30.0–36.0)
MCV: 61.3 fL — AB (ref 78.0–100.0)
Platelets: 245 10*3/uL (ref 150–400)
RBC: 5.14 MIL/uL — ABNORMAL HIGH (ref 3.87–5.11)
RDW: 17.3 % — AB (ref 11.5–15.5)
WBC: 15.1 10*3/uL — ABNORMAL HIGH (ref 4.0–10.5)

## 2014-05-17 LAB — TROPONIN I
Troponin I: 0.53 ng/mL (ref <0.30)
Troponin I: 1.24 ng/mL (ref <0.30)

## 2014-05-17 LAB — HEPARIN LEVEL (UNFRACTIONATED)
HEPARIN UNFRACTIONATED: 0.27 [IU]/mL — AB (ref 0.30–0.70)
Heparin Unfractionated: 0.68 IU/mL (ref 0.30–0.70)

## 2014-05-17 LAB — PRO B NATRIURETIC PEPTIDE: Pro B Natriuretic peptide (BNP): 3608 pg/mL — ABNORMAL HIGH (ref 0–125)

## 2014-05-17 LAB — MAGNESIUM: MAGNESIUM: 1.9 mg/dL (ref 1.5–2.5)

## 2014-05-17 MED ORDER — DEXTROSE 50 % IV SOLN
14.0000 mL | Freq: Once | INTRAVENOUS | Status: AC
  Administered 2014-05-17: 14 mL via INTRAVENOUS

## 2014-05-17 MED ORDER — POTASSIUM CHLORIDE 20 MEQ/15ML (10%) PO LIQD
40.0000 meq | Freq: Every day | ORAL | Status: DC
  Administered 2014-05-17: 40 meq via ORAL
  Filled 2014-05-17 (×2): qty 30

## 2014-05-17 MED ORDER — HEPARIN (PORCINE) IN NACL 100-0.45 UNIT/ML-% IJ SOLN
850.0000 [IU]/h | INTRAMUSCULAR | Status: DC
  Administered 2014-05-18: 700 [IU]/h via INTRAVENOUS
  Filled 2014-05-17 (×3): qty 250

## 2014-05-17 MED ORDER — POTASSIUM CHLORIDE 20 MEQ/15ML (10%) PO SOLN: 40.0000 meq | Freq: Every day | ORAL | Status: DC

## 2014-05-17 NOTE — Progress Notes (Signed)
Subjective: Patient seen at bedside this AM, mental status improved, answering questions, eyes open, responsive. Troponins peaked at 2.68 overnight, started on Heparin gtt.   Objective: Vital signs in last 24 hours: Filed Vitals:   05/17/14 1000 05/17/14 1100 05/17/14 1200 05/17/14 1218  BP: 136/99 130/91 118/86   Pulse: 102 99 96 98  Temp:  96.8 F (36 C)    TempSrc:  Axillary    Resp: 27 24 32 26  Height:      Weight:      SpO2: 99% 96% 97% 100%   Weight change:   Intake/Output Summary (Last 24 hours) at 05/17/14 1305 Last data filed at 05/17/14 1200  Gross per 24 hour  Intake 2702.07 ml  Output   1926 ml  Net 776.07 ml   Physical Exam: General: Awake, alert, answering yes/no questions. Improved from yesterday.  HEENT: PERRL, EOMI. Moist mucus membranes. Trach w/ O2 via collar.  Neck: Full range of motion without pain, supple, no lymphadenopathy or carotid bruits Lungs: Scattered coarse breath sounds, otherwise clear to auscultation. No wheezing.  Heart: Tachycardic, no murmurs, gallops, or rubs Abdomen: Soft, non-tender, non-distended, BS +. Left-sided PEG tube in place. Clean, dry, intact.  Extremities: No cyanosis, clubbing, or edema Neurologic: Alert, orientation not able to be assessed. Cranial nerves generally intact, strength grossly intact, sensation intact to light touch   Lab Results: Basic Metabolic Panel:  Recent Labs Lab 05/17/14 0003 05/17/14 0430 05/17/14 0826  NA 145 148* 143  K 3.9 3.9 3.3*  CL 111 112 108  CO2 20 21 20   GLUCOSE 126* 118* 111*  BUN 34* 28* 21  CREATININE 0.97 0.86 0.73  CALCIUM 7.9* 7.8* 7.5*  MG 1.9  --   --    Liver Function Tests:  Recent Labs Lab 05/15/14 1725  AST 24  ALT 12  ALKPHOS 101  BILITOT 0.2*  PROT 8.5*  ALBUMIN 4.4   CBC:  Recent Labs Lab 05/15/14 1753 05/16/14 0140 05/17/14 0826  WBC 28.0* 25.3* 15.1*  NEUTROABS 22.9*  --   --   HGB 10.3* 9.2* 10.5*  HCT 34.1* 30.2* 31.5*  MCV 65.5*  64.5* 61.3*  PLT 382 253 245   Cardiac Enzymes:  Recent Labs Lab 05/16/14 1815 05/17/14 0003 05/17/14 0826  TROPONINI 2.68* 1.24* 0.53*   CBG:  Recent Labs Lab 05/17/14 0207 05/17/14 0247 05/17/14 0405 05/17/14 0410 05/17/14 0510 05/17/14 0607  GLUCAP 111* 118* 88 104* 136* 139*    Thyroid Function Tests:  Recent Labs Lab 05/16/14 0545  TSH 1.700   Anemia Panel:  Recent Labs Lab 05/16/14 1015  VITAMINB12 >2000*  FOLATE >20.0  FERRITIN 199  TIBC 238*  IRON 20*  RETICCTPCT 0.9   Urine Drug Screen: Drugs of Abuse     Component Value Date/Time   LABOPIA NONE DETECTED 05/15/2014 2100   COCAINSCRNUR NONE DETECTED 05/15/2014 2100   LABBENZ NONE DETECTED 05/15/2014 2100   AMPHETMU NONE DETECTED 05/15/2014 2100   THCU NONE DETECTED 05/15/2014 2100   LABBARB NONE DETECTED 05/15/2014 2100    Urinalysis:  Recent Labs Lab 05/15/14 2100  COLORURINE YELLOW  LABSPEC 1.022  PHURINE 5.5  GLUCOSEU >1000*  HGBUR SMALL*  BILIRUBINUR NEGATIVE  KETONESUR >80*  PROTEINUR NEGATIVE  UROBILINOGEN 0.2  NITRITE NEGATIVE  LEUKOCYTESUR NEGATIVE    Micro Results: Recent Results (from the past 240 hour(s))  MRSA PCR Screening     Status: Abnormal   Collection Time: 05/16/14  1:36 AM  Result Value  Ref Range Status   MRSA by PCR POSITIVE (A) NEGATIVE Final    Comment:        The GeneXpert MRSA Assay (FDA approved for NASAL specimens only), is one component of a comprehensive MRSA colonization surveillance program. It is not intended to diagnose MRSA infection nor to guide or monitor treatment for MRSA infections. RESULT CALLED TO, READ BACK BY AND VERIFIED WITH: CALLED TO RN AVERY DANIEL [id number] @0445  THANEY  Culture, respiratory (NON-Expectorated)     Status: None (Preliminary result)   Collection Time: 05/16/14  8:35 AM  Result Value Ref Range Status   Specimen Description TRACHEAL ASPIRATE  Final   Special Requests NONE  Final   Gram Stain PENDING   Incomplete   Culture   Final    Culture reincubated for better growth Performed at [facility]    Report Status PENDING  Incomplete   Studies/Results: Ct Head Wo Contrast  05/15/2014   CLINICAL DATA:  Altered mental status.  EXAM: CT HEAD WITHOUT CONTRAST  TECHNIQUE: Contiguous axial images were obtained from the base of the skull through the vertex without intravenous contrast.  COMPARISON:  None.  FINDINGS: No intra-axial or extra-axial pathologic fluid or blood collection. No mass lesion. No hydrocephalus. No hemorrhage. No acute bony abnormality  IMPRESSION: No acute abnormality.   Electronically Signed   By: Maisie Fus  Register   On: 05/15/2014 19:30   Dg Chest Port 1 View  05/17/2014   CLINICAL DATA:  Diabetic ketoacidosis  EXAM: PORTABLE CHEST - 1 VIEW  COMPARISON:  May 17, 2014 study obtained earlier in the day  FINDINGS: Tracheostomy catheter tip is 2.6 cm above the carina. No pneumothorax. There is persistent interstitial edema with alveolar edema in the left parahilar region, stable. No new opacity. No change in cardiac silhouette. No adenopathy.  IMPRESSION: Findings felt to represent a degree of congestive heart failure. No new opacity. No change in cardiac silhouette.   Electronically Signed   By: Bretta Bang M.D.   On: 05/17/2014 11:20   Dg Chest Port 1 View  05/17/2014   CLINICAL DATA:  Productive cough ; hypertension  EXAM: PORTABLE CHEST - 1 VIEW  COMPARISON:  May 15, 2014  FINDINGS: Tracheostomy catheter tip is 3.3 cm above the carina. No pneumothorax. There is generalized interstitial edema with patchy alveolar edema in the left perihilar region. Lungs elsewhere clear. There is cardiomegaly, mild, with pulmonary venous hypertension. No adenopathy. No bone lesions.  IMPRESSION: Evidence of congestive heart failure. Tracheostomy as described without pneumothorax.   Electronically Signed   By: Bretta Bang M.D.   On: 05/17/2014 11:07   Dg Chest Port 1  View  05/15/2014   CLINICAL DATA:  Altered mental status for 1 day. Patient has a mental illness. Unresponsive today.  EXAM: PORTABLE CHEST - 1 VIEW  COMPARISON:  01/18/2013  FINDINGS: Tracheostomy tube in place. Tip is 2.1 cm from the carina. Upper normal heart size. Clear lungs.  IMPRESSION: Tracheostomy tube in place.  No active cardiopulmonary disease.   Electronically Signed   By: Maryclare Bean M.D.   On: 05/15/2014 18:34   Dg Abd Portable 1v  05/16/2014   CLINICAL DATA:  PEG placement  EXAM: PORTABLE ABDOMEN - 1 VIEW  COMPARISON:  08/22/2012  FINDINGS: Scattered large and small bowel gas is noted. A gastrostomy catheter is noted within the gastric lumen. No other focal abnormality is noted.  IMPRESSION: Gastrostomy catheter in place.   Electronically Signed   By:  Alcide CleverMark  Lukens M.D.   On: 05/16/2014 21:15    Medications: I have reviewed the patient's current medications. Scheduled Meds: . antiseptic oral rinse  7 mL Mouth Rinse q12n4p  . aspirin EC  81 mg Oral Daily  . atorvastatin  80 mg Oral q1800  . chlorhexidine  15 mL Mouth Rinse BID  . Chlorhexidine Gluconate Cloth  6 each Topical Q0600  . metoprolol tartrate  12.5 mg Oral BID   Or  . metoprolol tartrate  12.5 mg Oral BID  . mupirocin ointment  1 application Nasal BID  . potassium chloride  40 mEq Oral Daily   Continuous Infusions: . dextrose 5 % and 0.9% NaCl 50 mL/hr (05/17/14 1130)  . heparin 750 Units/hr (05/17/14 1200)  . insulin (NOVOLIN-R) infusion 2.2 Units/hr (05/17/14 1200)   PRN Meds:.dextrose, ipratropium-albuterol, sodium chloride, sodium chloride  Assessment/Plan: 48 y/o F w/ PMHx of DM type I, microcytic anemia, HLD, CAD, HTN, trach and PEG dependent, w/ h/o polysubstance abuse, admitted for severe DKA.   DKA w/ Acute Encephalopathy: Significantly improved. HCO3 21, AG 15 most recently. Still on Insulin gtt given inability to take po as patient is trach dependent.  -Continue Insulin gtt -Transition patient to  SubQ insulin pending nutrition consult for PEG feeds. Likely transition tomorrow (uses Lantus 10 units bid + ISS at home).  -Continue D5NS @ 75 cc/hr for now -Keep NPO pending HCO3, AG, and nutrition consult.  -Continue BMP q2h for now -K supplementation as necessary per tube (limited IV access)  Anion Gap Metabolic Acidosis w/ Respiratory Compensation: Improving. Tachypnea improved, HCO3 increasing, AG closing slowly. Most likely d/t DKA in addition to lactic acidosis on admission (5.23 initially, now resolved).  -Continue to monitor via BMP q2h -IVF as above  NSTEMI: Patient w/ troponin elevation, peaking at 2.68. No significant EKG changes suggesting ischemia, however, given risk factors, cardiology consulted, Heparin gtt started, will likely need cath pending resolution of other medical issues.  -Continue Heparin gtt -ECHO pending -Cath per cardiology, pending resolution of DKA -Continue Lipitor, ASA, Lopressor 12.5 mg bid  Leukocytosis: Most likely leukemoid reaction in the setting of DKA, resolving. CXR not suggestive of infiltrate.  -Respiratory culture pending -Blood cultures pedning -CBC in AM  AKI: Resolved.  -IVF as above -BMP q2h  Hypotension: Resolved. Most likely initially 2/2 dehydration and volume depletion in the setting of DKA.  -Continue to monitor -IVF as above  Microcytic anemia: Stable. Hb 10.5 most recently. Patient on ferrous sulfate 325 mg daily at home. Repeat Iron studies show low T Sat (8%), low TIBC (238), and low Iron (20).  -CBC in AM  -Restart iron when tolerating tube feeds.  Mental disorder: Stable. Mental status improved.  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  -Restart Methadone 5 mg bid per tube  DTV PPx: Heparin gtt  Dispo: Disposition is deferred at this  time, awaiting improvement of current medical problems.  Anticipated discharge in approximately 2-3 day(s).   The patient does have a current PCP (Rich Numberarly Rivet, MD) and [facility].  The patient does not have transportation limitations that hinder transportation to clinic appointments.  .Services Needed at time of discharge: Y = Yes, Blank = No PT:   OT:   RN:   Equipment:  Other:     LOS: 2 days   Courtney Paris, MD 05/17/2014, 1:05 PM

## 2014-05-17 NOTE — Progress Notes (Signed)
PT Cancellation Note  Patient Details Name: [name] DistanceLenora Mcnabb MRN: [id number] DOB: 11-Mar-1966   Cancelled Treatment:    Reason Eval/Treat Not Completed: Medical issues which prohibited therapy (elevated troponins, not yet downtrending, will hold today)   Fabio AsaWerner, Jaeveon Ashland J 05/17/2014, 7:44 AM Charlotte Crumbevon Loleta Frommelt, PT DPT  [phone]

## 2014-05-17 NOTE — Consult Note (Addendum)
ANTICOAGULATION CONSULT NOTE - Initial Consult  Pharmacy Consult for Heparin Indication: chest pain/ACS  No Known Allergies  Patient Measurements: Height: 5\' 1"  (154.9 cm) Weight: 113 lb 12.1 oz (51.6 kg) IBW/kg (Calculated) : 47.8 Heparin Dosing Weight: 51.6kg  Vital Signs: Temp: 98.9 F (37.2 C) (11/01 0700) Temp Source: Axillary (11/01 0700) BP: 136/93 mmHg (11/01 0900) Pulse Rate: 108 (11/01 0900)  Labs:  Recent Labs  05/15/14 1753  05/16/14 0140  05/16/14 1815  05/17/14 0003 05/17/14 0430 05/17/14 0826  HGB 10.3*  --  9.2*  --   --   --   --   --  10.5*  HCT 34.1*  --  30.2*  --   --   --   --   --  31.5*  PLT 382  --  253  --   --   --   --   --  245  HEPARINUNFRC  --   --   --   --   --   --   --   --  0.27*  CREATININE  --   < > 2.37*  < > 1.08  < > 0.97 0.86 0.73  TROPONINI  --   < > 0.50*  --  2.68*  --  1.24*  --  0.53*  < > = values in this interval not displayed.  Estimated Creatinine Clearance: 64.2 mL/min (by C-G formula based on Cr of 0.73).  Assessment: 48yof to begin IV heparin for positive troponins.  First heparin level below goal at 0.27.  CBC stable.    Goal of Therapy:  Heparin level 0.3-0.7 units/ml Monitor platelets by anticoagulation protocol: Yes   Plan:  1) Increase heparin to 750 units/hr 2) Check 6 hour heparin level 3) Daily heparin level and CBC  Mickeal SkinnerFrens, Jeremy John 05/17/2014,10:15 AM  Addendum: Follow up heparin level is therapeutic at 0.68, but has increase significantly so will decrease rate slightly to 700 units/hr and follow up AM labs.  Louie CasaMarkle, Nealy Karapetian 05/17/2014, 6:03 PM

## 2014-05-17 NOTE — Progress Notes (Signed)
Patient ID: [name], female   DOB: 1965-11-15, 48 y.o.   MRN: [id number]  Cardiology Progress note  Patient Name: [name] Date of Encounter: 05/17/2014     Principal Problem:   DKA (diabetic ketoacidosis) Active Problems:   Diabetes mellitus type 1, uncontrolled, insulin dependent   HLD (hyperlipidemia)   Microcytic anemia   Substance abuse   Mental disorder   Hypotension   Metabolic acidosis with respiratory alkalosis   Acute encephalopathy   EKG abnormalities    SUBJECTIVE  "I feel better", denies chest pain. Appears comfortable.  CURRENT MEDS . antiseptic oral rinse  7 mL Mouth Rinse q12n4p  . aspirin EC  81 mg Oral Daily  . atorvastatin  80 mg Oral q1800  . chlorhexidine  15 mL Mouth Rinse BID  . Chlorhexidine Gluconate Cloth  6 each Topical Q0600  . metoprolol tartrate  12.5 mg Oral BID   Or  . metoprolol tartrate  12.5 mg Oral BID  . mupirocin ointment  1 application Nasal BID    OBJECTIVE  Filed Vitals:   05/17/14 0700 05/17/14 0800 05/17/14 0816 05/17/14 0900  BP: 141/98 129/88 129/88 136/93  Pulse: 113 101 90 108  Temp: 98.9 F (37.2 C)     TempSrc: Axillary     Resp: 23 24 28 31   Height:      Weight:      SpO2: 97% 96% 98% 100%    Intake/Output Summary (Last 24 hours) at 05/17/14 0933 Last data filed at 05/17/14 0900  Gross per 24 hour  Intake 2850.35 ml  Output   1696 ml  Net 1154.35 ml   Filed Weights   05/16/14 1500  Weight: 113 lb 12.1 oz (51.6 kg)    PHYSICAL EXAM  General: Pleasant, trach tube in place, NAD. Neuro: Alert and oriented X 3. Moves all extremities spontaneously. Psych: Normal affect. HEENT:  Normal  Neck: Supple without bruits or JVD. Trach tube in place Lungs:  Resp regular and unlabored, CTA. Heart: RRR no s3, s4, or murmurs. Abdomen: Soft, non-tender, non-distended, BS + x 4.  Extremities: No clubbing, cyanosis or edema. DP/PT/Radials 2+ and equal bilaterally.  Accessory Clinical  Findings  CBC  Recent Labs  05/15/14 1753 05/16/14 0140 05/17/14 0826  WBC 28.0* 25.3* 15.1*  NEUTROABS 22.9*  --   --   HGB 10.3* 9.2* 10.5*  HCT 34.1* 30.2* 31.5*  MCV 65.5* 64.5* 61.3*  PLT 382 253 245   Basic Metabolic Panel  Recent Labs  05/17/14 0003 05/17/14 0430 05/17/14 0826  NA 145 148* 143  K 3.9 3.9 3.3*  CL 111 112 108  CO2 20 21 20   GLUCOSE 126* 118* 111*  BUN 34* 28* 21  CREATININE 0.97 0.86 0.73  CALCIUM 7.9* 7.8* 7.5*  MG 1.9  --   --    Liver Function Tests  Recent Labs  05/15/14 1725  AST 24  ALT 12  ALKPHOS 101  BILITOT 0.2*  PROT 8.5*  ALBUMIN 4.4   No results for input(s): LIPASE, AMYLASE in the last 72 hours. Cardiac Enzymes  Recent Labs  05/16/14 1815 05/17/14 0003 05/17/14 0826  TROPONINI 2.68* 1.24* 0.53*   BNP Invalid input(s): POCBNP D-Dimer No results for input(s): DDIMER in the last 72 hours. Hemoglobin A1C  Recent Labs  05/16/14 1015  HGBA1C 9.5*   Fasting Lipid Panel No results for input(s): CHOL, HDL, LDLCALC, TRIG, CHOLHDL, LDLDIRECT in the last 72 hours. Thyroid Function Tests  Recent Labs  05/16/14  0545  TSH 1.700    TELE  Nsr/sinus tachy  Radiology/Studies  Ct Head Wo Contrast  05/15/2014   CLINICAL DATA:  Altered mental status.  EXAM: CT HEAD WITHOUT CONTRAST  TECHNIQUE: Contiguous axial images were obtained from the base of the skull through the vertex without intravenous contrast.  COMPARISON:  None.  FINDINGS: No intra-axial or extra-axial pathologic fluid or blood collection. No mass lesion. No hydrocephalus. No hemorrhage. No acute bony abnormality  IMPRESSION: No acute abnormality.   Electronically Signed   By: Maisie Fushomas  Register   On: 05/15/2014 19:30   Dg Chest Port 1 View  05/15/2014   CLINICAL DATA:  Altered mental status for 1 day. Patient has a mental illness. Unresponsive today.  EXAM: PORTABLE CHEST - 1 VIEW  COMPARISON:  01/18/2013  FINDINGS: Tracheostomy tube in place. Tip is  2.1 cm from the carina. Upper normal heart size. Clear lungs.  IMPRESSION: Tracheostomy tube in place.  No active cardiopulmonary disease.   Electronically Signed   By: Maryclare BeanArt  Hoss M.D.   On: 05/15/2014 18:34   Dg Abd Portable 1v  05/16/2014   CLINICAL DATA:  PEG placement  EXAM: PORTABLE ABDOMEN - 1 VIEW  COMPARISON:  08/22/2012  FINDINGS: Scattered large and small bowel gas is noted. A gastrostomy catheter is noted within the gastric lumen. No other focal abnormality is noted.  IMPRESSION: Gastrostomy catheter in place.   Electronically Signed   By: Alcide CleverMark  Lukens M.D.   On: 05/16/2014 21:15    ASSESSMENT AND PLAN  1. NSTEMI - she will likely need cath. While this could be a type 2 event, her longstanding diabetes makes the likelihood of CAD much higher. 2. DKA - as per primary team. 3. Hypokalemia - aggressive potassium supplementation indicated  Yashvi Jasinski,M.D.   Shyniece Scripter,M.D.  05/17/2014 9:33 AM

## 2014-05-18 ENCOUNTER — Inpatient Hospital Stay (HOSPITAL_COMMUNITY): Payer: Medicaid Other

## 2014-05-18 ENCOUNTER — Ambulatory Visit: Payer: Medicaid Other | Admitting: Internal Medicine

## 2014-05-18 ENCOUNTER — Encounter: Payer: Medicaid Other | Admitting: Dietician

## 2014-05-18 DIAGNOSIS — R9431 Abnormal electrocardiogram [ECG] [EKG]: Secondary | ICD-10-CM

## 2014-05-18 DIAGNOSIS — I059 Rheumatic mitral valve disease, unspecified: Secondary | ICD-10-CM

## 2014-05-18 DIAGNOSIS — E785 Hyperlipidemia, unspecified: Secondary | ICD-10-CM

## 2014-05-18 DIAGNOSIS — E1065 Type 1 diabetes mellitus with hyperglycemia: Secondary | ICD-10-CM

## 2014-05-18 LAB — BASIC METABOLIC PANEL
ANION GAP: 14 (ref 5–15)
Anion gap: 15 (ref 5–15)
Anion gap: 14 (ref 5–15)
Anion gap: 13 (ref 5–15)
Anion gap: 13 (ref 5–15)
BUN: 10 mg/dL (ref 6–23)
BUN: 9 mg/dL (ref 6–23)
BUN: 9 mg/dL (ref 6–23)
BUN: 9 mg/dL (ref 6–23)
BUN: 11 mg/dL (ref 6–23)
CALCIUM: 7.4 mg/dL — AB (ref 8.4–10.5)
CALCIUM: 7.3 mg/dL — AB (ref 8.4–10.5)
CHLORIDE: 100 meq/L (ref 96–112)
CHLORIDE: 101 meq/L (ref 96–112)
CO2: 22 meq/L (ref 19–32)
CO2: 24 meq/L (ref 19–32)
CO2: 25 meq/L (ref 19–32)
CO2: 27 meq/L (ref 19–32)
CO2: 26 meq/L (ref 19–32)
Calcium: 7.3 mg/dL — ABNORMAL LOW (ref 8.4–10.5)
Calcium: 7.5 mg/dL — ABNORMAL LOW (ref 8.4–10.5)
Calcium: 7.3 mg/dL — ABNORMAL LOW (ref 8.4–10.5)
Chloride: 103 mEq/L (ref 96–112)
Chloride: 102 mEq/L (ref 96–112)
Chloride: 97 mEq/L (ref 96–112)
Creatinine, Ser: 0.58 mg/dL (ref 0.50–1.10)
Creatinine, Ser: 0.56 mg/dL (ref 0.50–1.10)
Creatinine, Ser: 0.58 mg/dL (ref 0.50–1.10)
Creatinine, Ser: 0.58 mg/dL (ref 0.50–1.10)
Creatinine, Ser: 0.67 mg/dL (ref 0.50–1.10)
GFR calc Af Amer: >90 mL/min (ref >90)
GFR calc Af Amer: >90 mL/min (ref >90)
GFR calc Af Amer: >90 mL/min (ref >90)
GFR calc Af Amer: >90 mL/min (ref >90)
GFR calc Af Amer: >90 mL/min (ref >90)
GFR calc non Af Amer: >90 mL/min (ref >90)
GFR calc non Af Amer: >90 mL/min (ref >90)
GFR calc non Af Amer: >90 mL/min (ref >90)
GLUCOSE: 126 mg/dL — AB (ref 70–99)
GLUCOSE: 120 mg/dL — AB (ref 70–99)
GLUCOSE: 118 mg/dL — AB (ref 70–99)
Glucose, Bld: 81 mg/dL (ref 70–99)
Glucose, Bld: 171 mg/dL — ABNORMAL HIGH (ref 70–99)
POTASSIUM: 3.4 meq/L — AB (ref 3.7–5.3)
POTASSIUM: 3.1 meq/L — AB (ref 3.7–5.3)
POTASSIUM: 3.6 meq/L — AB (ref 3.7–5.3)
POTASSIUM: 3.3 meq/L — AB (ref 3.7–5.3)
Potassium: 4.1 mEq/L (ref 3.7–5.3)
SODIUM: 140 meq/L (ref 137–147)
SODIUM: 139 meq/L (ref 137–147)
SODIUM: 141 meq/L (ref 137–147)
SODIUM: 136 meq/L — AB (ref 137–147)
Sodium: 140 mEq/L (ref 137–147)

## 2014-05-18 LAB — GLUCOSE, CAPILLARY
GLUCOSE-CAPILLARY: 112 mg/dL — AB (ref 70–99)
GLUCOSE-CAPILLARY: 112 mg/dL — AB (ref 70–99)
GLUCOSE-CAPILLARY: 118 mg/dL — AB (ref 70–99)
GLUCOSE-CAPILLARY: 126 mg/dL — AB (ref 70–99)
GLUCOSE-CAPILLARY: 128 mg/dL — AB (ref 70–99)
GLUCOSE-CAPILLARY: 131 mg/dL — AB (ref 70–99)
GLUCOSE-CAPILLARY: 153 mg/dL — AB (ref 70–99)
GLUCOSE-CAPILLARY: 164 mg/dL — AB (ref 70–99)
Glucose-Capillary: 131 mg/dL — ABNORMAL HIGH (ref 70–99)
Glucose-Capillary: 129 mg/dL — ABNORMAL HIGH (ref 70–99)
Glucose-Capillary: 123 mg/dL — ABNORMAL HIGH (ref 70–99)
Glucose-Capillary: 130 mg/dL — ABNORMAL HIGH (ref 70–99)
Glucose-Capillary: 122 mg/dL — ABNORMAL HIGH (ref 70–99)
Glucose-Capillary: 116 mg/dL — ABNORMAL HIGH (ref 70–99)
Glucose-Capillary: 102 mg/dL — ABNORMAL HIGH (ref 70–99)
Glucose-Capillary: 134 mg/dL — ABNORMAL HIGH (ref 70–99)
Glucose-Capillary: 89 mg/dL (ref 70–99)
Glucose-Capillary: 96 mg/dL (ref 70–99)
Glucose-Capillary: 242 mg/dL — ABNORMAL HIGH (ref 70–99)

## 2014-05-18 LAB — CBC
HEMATOCRIT: 31.9 % — AB (ref 36.0–46.0)
Hemoglobin: 10.6 g/dL — ABNORMAL LOW (ref 12.0–15.0)
MCH: 19.9 pg — ABNORMAL LOW (ref 26.0–34.0)
MCHC: 33.2 g/dL (ref 30.0–36.0)
MCV: 59.8 fL — AB (ref 78.0–100.0)
Platelets: 196 10*3/uL (ref 150–400)
RBC: 5.33 MIL/uL — ABNORMAL HIGH (ref 3.87–5.11)
RDW: 16.9 % — ABNORMAL HIGH (ref 11.5–15.5)
WBC: 9.2 10*3/uL (ref 4.0–10.5)

## 2014-05-18 LAB — HEPARIN LEVEL (UNFRACTIONATED): HEPARIN UNFRACTIONATED: 0.45 [IU]/mL (ref 0.30–0.70)

## 2014-05-18 MED ORDER — PERFLUTREN LIPID MICROSPHERE
1.0000 mL | INTRAVENOUS | Status: AC | PRN
Start: 2014-05-18 — End: 2014-05-18
  Administered 2014-05-18: 2 mL via INTRAVENOUS
  Filled 2014-05-18: qty 10

## 2014-05-18 MED ORDER — POTASSIUM CHLORIDE 20 MEQ/15ML (10%) PO SOLN
40.0000 meq | Freq: Once | ORAL | Status: AC
  Administered 2014-05-18: 40 meq via ORAL
  Filled 2014-05-18: qty 30

## 2014-05-18 MED ORDER — GLUCERNA 1.2 CAL PO LIQD
1000.0000 mL | ORAL | Status: DC
  Filled 2014-05-18 (×2): qty 1000

## 2014-05-18 MED ORDER — POTASSIUM CHLORIDE 10 MEQ/100ML IV SOLN
10.0000 meq | INTRAVENOUS | Status: DC
  Filled 2014-05-18: qty 100

## 2014-05-18 MED ORDER — POTASSIUM CHLORIDE 10 MEQ/50ML IV SOLN
10.0000 meq | INTRAVENOUS | Status: AC
  Administered 2014-05-18 (×2): 10 meq via INTRAVENOUS
  Filled 2014-05-18 (×2): qty 50

## 2014-05-18 MED ORDER — INSULIN ASPART 100 UNIT/ML ~~LOC~~ SOLN
0.0000 [IU] | SUBCUTANEOUS | Status: DC
  Administered 2014-05-18: 2 [IU] via SUBCUTANEOUS
  Administered 2014-05-18: 3 [IU] via SUBCUTANEOUS
  Administered 2014-05-19: 2 [IU] via SUBCUTANEOUS
  Administered 2014-05-19: 3 [IU] via SUBCUTANEOUS
  Administered 2014-05-19: 5 [IU] via SUBCUTANEOUS
  Administered 2014-05-19: 7 [IU] via SUBCUTANEOUS
  Administered 2014-05-19: 2 [IU] via SUBCUTANEOUS
  Administered 2014-05-20: 5 [IU] via SUBCUTANEOUS
  Administered 2014-05-20: 3 [IU] via SUBCUTANEOUS
  Administered 2014-05-20: 5 [IU] via SUBCUTANEOUS
  Administered 2014-05-20: 2 [IU] via SUBCUTANEOUS
  Administered 2014-05-20: 1 [IU] via SUBCUTANEOUS
  Administered 2014-05-20: 2 [IU] via SUBCUTANEOUS
  Administered 2014-05-21: 7 [IU] via SUBCUTANEOUS
  Administered 2014-05-21: 3 [IU] via SUBCUTANEOUS
  Administered 2014-05-21: 1 [IU] via SUBCUTANEOUS

## 2014-05-18 MED ORDER — VITAL HIGH PROTEIN PO LIQD
1000.0000 mL | ORAL | Status: DC
Start: 2014-05-18 — End: 2014-05-18

## 2014-05-18 MED ORDER — POTASSIUM CHLORIDE 20 MEQ/15ML (10%) PO SOLN
40.0000 meq | Freq: Once | ORAL | Status: AC
  Administered 2014-05-18: 40 meq
  Filled 2014-05-18: qty 30

## 2014-05-18 MED ORDER — SODIUM CHLORIDE 0.9 % IJ SOLN
10.0000 mL | INTRAMUSCULAR | Status: DC | PRN
  Administered 2014-05-21 – 2014-05-22 (×6): 10 mL
  Filled 2014-05-18 (×6): qty 40

## 2014-05-18 MED ORDER — INSULIN GLARGINE 100 UNIT/ML ~~LOC~~ SOLN
7.0000 [IU] | Freq: Once | SUBCUTANEOUS | Status: AC
  Administered 2014-05-18: 7 [IU] via SUBCUTANEOUS
  Filled 2014-05-18: qty 0.07

## 2014-05-18 MED ORDER — SODIUM CHLORIDE 0.9 % IJ SOLN
10.0000 mL | Freq: Two times a day (BID) | INTRAMUSCULAR | Status: DC
  Administered 2014-05-18 – 2014-05-21 (×7): 10 mL

## 2014-05-18 MED ORDER — SODIUM CHLORIDE 0.9 % IV SOLN: INTRAVENOUS | Status: DC

## 2014-05-18 MED ORDER — POTASSIUM CHLORIDE 20 MEQ/15ML (10%) PO SOLN: 40.0000 meq | Freq: Once | ORAL | Status: DC

## 2014-05-18 MED ORDER — ACETAMINOPHEN 160 MG/5ML PO SOLN
325.0000 mg | Freq: Four times a day (QID) | ORAL | Status: DC | PRN
  Administered 2014-05-18: 325 mg via ORAL
  Filled 2014-05-18: qty 20.3

## 2014-05-18 MED ORDER — FUROSEMIDE 10 MG/ML IJ SOLN
40.0000 mg | Freq: Once | INTRAMUSCULAR | Status: AC
  Administered 2014-05-18: 40 mg via INTRAVENOUS
  Filled 2014-05-18: qty 4

## 2014-05-18 NOTE — Progress Notes (Signed)
OT Cancellation Note  Patient Details Name: [name] MRN: [id number] DOB: 04-Jul-1966   Cancelled Treatment:    Reason Eval/Treat Not Completed: Patient at procedure or test/ unavailable - will reattempt.   Angelene GiovanniConarpe, Lanette Ell M  Daevon Holdren [place], OTR/L [phone]  05/18/2014, 2:21 PM

## 2014-05-18 NOTE — Plan of Care (Signed)
Problem: Phase I Progression Outcomes Goal: OOB as tolerated unless otherwise ordered Outcome: Completed/Met Date Met:  05/18/14

## 2014-05-18 NOTE — Progress Notes (Signed)
Peripherally Inserted Central Catheter/Midline Placement  The IV Nurse has discussed with the patient and/or persons authorized to consent for the patient, the purpose of this procedure and the potential benefits and risks involved with this procedure.  The benefits include less needle sticks, lab draws from the catheter and patient may be discharged home with the catheter.  Risks include, but not limited to, infection, bleeding, blood clot (thrombus formation), and puncture of an artery; nerve damage and irregular heat beat.  Alternatives to this procedure were also discussed.  PICC/Midline Placement Documentation        Lisabeth DevoidGibbs, Donavin Audino Jeanette 05/18/2014, 1:56 PM

## 2014-05-18 NOTE — Progress Notes (Signed)
  Date: 05/18/2014  Patient name: [name]  Medical record number: [id number]  Date of birth: February 15, 1966   This patient has been seen and the plan of care was discussed with the house staff. Please see their note for complete details. I concur with their findings with the following additions/corrections:  Pt seen, discussed with house officers.  Very complicated case of CAD, DKA, fluid overload.  Will try to balance her insulin needs with her fluid needs, need to be NPO over the next 24h as she gets cath. [facility]    [name] SmartJeffrey C Malic Rosten, MD 05/18/2014, 12:20 PM

## 2014-05-18 NOTE — Progress Notes (Signed)
Subjective: Patient seen at bedside this AM, significantly improved. Mental status much better even than yesterday. Able to speak, oriented x3.   Discussed at length w/ mother on the phone, states that the patient eats a pureed diet at home and only takes water through her PEG when necessary for dehydration.   P[place] valve placed by SLP, patient speaking well.   Objective: Vital signs in last 24 hours: Filed Vitals:   05/18/14 0428 05/18/14 0500 05/18/14 0600 05/18/14 0700  BP:  118/89 128/88 128/88  Pulse: 99 97 102 99  Temp:      TempSrc:      Resp: 15 27 23 20   Height:      Weight:      SpO2: 99% 99% 99% 99%   Weight change:   Intake/Output Summary (Last 24 hours) at 05/18/14 0733 Last data filed at 05/18/14 0700  Gross per 24 hour  Intake 2312.88 ml  Output   1925 ml  Net 387.88 ml   Physical Exam: General: Awake, alert, oriented, answering questions appropriately.  HEENT: PERRL, EOMI. Moist mucus membranes. Trach w/ O2 via collar. [place] valve in place Neck: Full range of motion without pain, supple, no lymphadenopathy or carotid bruits Lungs: Scattered coarse breath sounds, otherwise clear to auscultation. No wheezing.  Heart: Tachycardic, no murmurs, gallops, or rubs Abdomen: Soft, non-tender, non-distended, BS +. Left-sided PEG tube in place. Clean, dry, intact.  Extremities: No cyanosis, clubbing, or edema Neurologic: Alert and oriented x3. Cranial nerves generally intact, strength grossly intact, sensation intact to light touch   Lab Results: Basic Metabolic Panel:  Recent Labs Lab 05/17/14 0003  05/18/14 0320 05/18/14 0551  NA 145  < > 140 140  K 3.9  < > 4.1 3.4*  CL 111  < > 103 102  CO2 20  < > 22 24  GLUCOSE 126*  < > 126* 120*  BUN 34*  < > 10 9  CREATININE 0.97  < > 0.58 0.56  CALCIUM 7.9*  < > 7.4* 7.3*  MG 1.9  --   --   --   < > = values in this interval not displayed. Liver Function Tests:  Recent Labs Lab 05/15/14 1725    AST 24  ALT 12  ALKPHOS 101  BILITOT 0.2*  PROT 8.5*  ALBUMIN 4.4   CBC:  Recent Labs Lab 05/15/14 1753  05/17/14 0826 05/18/14 0320  WBC 28.0*  < > 15.1* 9.2  NEUTROABS 22.9*  --   --   --   HGB 10.3*  < > 10.5* 10.6*  HCT 34.1*  < > 31.5* 31.9*  MCV 65.5*  < > 61.3* 59.8*  PLT 382  < > 245 196  < > = values in this interval not displayed. Cardiac Enzymes:  Recent Labs Lab 05/16/14 1815 05/17/14 0003 05/17/14 0826  TROPONINI 2.68* 1.24* 0.53*   CBG:  Recent Labs Lab 05/17/14 2303 05/18/14 0007 05/18/14 0111 05/18/14 0201 05/18/14 0306 05/18/14 0350  GLUCAP 131* 126* 118* 112* 129* 123*    Thyroid Function Tests:  Recent Labs Lab 05/16/14 0545  TSH 1.700   Anemia Panel:  Recent Labs Lab 05/16/14 1015  VITAMINB12 >2000*  FOLATE >20.0  FERRITIN 199  TIBC 238*  IRON 20*  RETICCTPCT 0.9   Urine Drug Screen: Drugs of Abuse     Component Value Date/Time   LABOPIA NONE DETECTED 05/15/2014 2100   COCAINSCRNUR NONE DETECTED 05/15/2014 2100   LABBENZ NONE DETECTED  05/15/2014 2100   AMPHETMU NONE DETECTED 05/15/2014 2100   THCU NONE DETECTED 05/15/2014 2100   LABBARB NONE DETECTED 05/15/2014 2100    Urinalysis:  Recent Labs Lab 05/15/14 2100  COLORURINE YELLOW  LABSPEC 1.022  PHURINE 5.5  GLUCOSEU >1000*  HGBUR SMALL*  BILIRUBINUR NEGATIVE  KETONESUR >80*  PROTEINUR NEGATIVE  UROBILINOGEN 0.2  NITRITE NEGATIVE  LEUKOCYTESUR NEGATIVE    Micro Results: Recent Results (from the past 240 hour(s))  Blood culture (routine x 2)     Status: None (Preliminary result)   Collection Time: 05/15/14  5:53 PM  Result Value Ref Range Status   Specimen Description BLOOD ARM RIGHT  Final   Special Requests BOTTLES DRAWN AEROBIC AND ANAEROBIC 10CC  Final   Culture  Setup Time   Final    05/16/2014 01:20 Performed at [facility]    Culture   Final           BLOOD CULTURE RECEIVED NO GROWTH TO DATE CULTURE WILL BE HELD FOR 5 DAYS  BEFORE ISSUING A FINAL NEGATIVE REPORT Performed at [facility]    Report Status PENDING  Incomplete  Blood culture (routine x 2)     Status: None (Preliminary result)   Collection Time: 05/15/14  6:09 PM  Result Value Ref Range Status   Specimen Description BLOOD FOREARM LEFT  Final   Special Requests BOTTLES DRAWN AEROBIC AND ANAEROBIC 5CC  Final   Culture  Setup Time   Final    05/16/2014 01:22 Performed at [facility]    Culture   Final           BLOOD CULTURE RECEIVED NO GROWTH TO DATE CULTURE WILL BE HELD FOR 5 DAYS BEFORE ISSUING A FINAL NEGATIVE REPORT Performed at [facility]    Report Status PENDING  Incomplete  MRSA PCR Screening     Status: Abnormal   Collection Time: 05/16/14  1:36 AM  Result Value Ref Range Status   MRSA by PCR POSITIVE (A) NEGATIVE Final    Comment:        The GeneXpert MRSA Assay (FDA approved for NASAL specimens only), is one component of a comprehensive MRSA colonization surveillance program. It is not intended to diagnose MRSA infection nor to guide or monitor treatment for MRSA infections. RESULT CALLED TO, READ BACK BY AND VERIFIED WITH: CALLED TO RN AVERY DANIEL [phone] @0445  THANEY  Culture, respiratory (NON-Expectorated)     Status: None (Preliminary result)   Collection Time: 05/16/14  8:35 AM  Result Value Ref Range Status   Specimen Description TRACHEAL ASPIRATE  Final   Special Requests NONE  Final   Gram Stain PENDING  Incomplete   Culture   Final    Culture reincubated for better growth Performed at [facility]    Report Status PENDING  Incomplete   Studies/Results: Dg Chest Port 1 View  05/17/2014   CLINICAL DATA:  Diabetic ketoacidosis  EXAM: PORTABLE CHEST - 1 VIEW  COMPARISON:  May 17, 2014 study obtained earlier in the day  FINDINGS: Tracheostomy catheter tip is 2.6 cm above the carina. No pneumothorax. There is persistent interstitial edema with alveolar edema in the left  parahilar region, stable. No new opacity. No change in cardiac silhouette. No adenopathy.  IMPRESSION: Findings felt to represent a degree of congestive heart failure. No new opacity. No change in cardiac silhouette.   Electronically Signed   By: Bretta Bang M.D.   On: 05/17/2014 11:20  Dg Chest Port 1 View  05/17/2014   CLINICAL DATA:  Productive cough ; hypertension  EXAM: PORTABLE CHEST - 1 VIEW  COMPARISON:  May 15, 2014  FINDINGS: Tracheostomy catheter tip is 3.3 cm above the carina. No pneumothorax. There is generalized interstitial edema with patchy alveolar edema in the left perihilar region. Lungs elsewhere clear. There is cardiomegaly, mild, with pulmonary venous hypertension. No adenopathy. No bone lesions.  IMPRESSION: Evidence of congestive heart failure. Tracheostomy as described without pneumothorax.   Electronically Signed   By: Bretta Bang M.D.   On: 05/17/2014 11:07   Dg Abd Portable 1v  05/16/2014   CLINICAL DATA:  PEG placement  EXAM: PORTABLE ABDOMEN - 1 VIEW  COMPARISON:  08/22/2012  FINDINGS: Scattered large and small bowel gas is noted. A gastrostomy catheter is noted within the gastric lumen. No other focal abnormality is noted.  IMPRESSION: Gastrostomy catheter in place.   Electronically Signed   By: Alcide Clever M.D.   On: 05/16/2014 21:15    Medications: I have reviewed the patient's current medications. Scheduled Meds: . antiseptic oral rinse  7 mL Mouth Rinse q12n4p  . aspirin EC  81 mg Oral Daily  . atorvastatin  80 mg Oral q1800  . chlorhexidine  15 mL Mouth Rinse BID  . Chlorhexidine Gluconate Cloth  6 each Topical Q0600  . metoprolol tartrate  12.5 mg Oral BID   Or  . metoprolol tartrate  12.5 mg Oral BID  . mupirocin ointment  1 application Nasal BID  . potassium chloride  40 mEq Oral Once   Continuous Infusions: . dextrose 5 % and 0.9% NaCl 75 mL/hr at 05/18/14 0700  . heparin 700 Units/hr (05/17/14 1835)  . insulin (NOVOLIN-R) infusion  1.9 mL/hr at 05/18/14 0700   PRN Meds:.dextrose, ipratropium-albuterol, sodium chloride, sodium chloride  Assessment/Plan: 48 y/o F w/ PMHx of DM type I, microcytic anemia, HLD, CAD, HTN, trach and PEG dependent, w/ h/o polysubstance abuse, admitted for severe DKA.   DKA: Significantly improved. AG closed, acidosis resolved. Difficult situation w/ respect to CBG's and fluid status. Unable to take po at this time given cath in the AM.  -Discontinue insulin gtt; start on Lantus 7 units for now. Will closely follow CBG's to assess insulin need.  -PICC placement today -D/c IVF for now; may add low rate D5 or D10 for hypoglycemia.  -Keep NPO for now; assessed by SLP today, for formal evaluation tomorrow s/p cardiac catheterization -Continue BMP q4h for now -K supplementation as necessary per tube; may give IV once PICC placed.   Anion Gap Metabolic Acidosis w/ Respiratory Compensation: Resolved as above.   -Insulin as above -Continue to monitor via BMP q4h -IVF as above  NSTEMI: Patient w/ troponin elevation, peaking at 2.68. On Heparin gtt, for cardiac catheterization in the AM. CXR w/ mild volume overload.  -Keep NPO for now -Lasix 40 mg IV once -Continue Heparin gtt -ECHO pending today -Cath per cardiology in AM -Continue Lipitor, ASA, Lopressor 12.5 mg bid  Leukocytosis: Resolved. Tracheal aspirate shows staph aureus, likely only mild tracheitis on admission. Trach changed, CXR w/ NACPD.  -Blood cultures pending -Repeat CBC in AM  AKI: Resolved.  -IVF as above -BMP q2h  Hypotension: Resolved. Now w/ mild volume overload. -Lasix as above -Continue to monitor  Microcytic anemia: Stable. Hb 10.6 most recently. Patient on ferrous sulfate 325 mg daily at home. Repeat Iron studies show low T Sat (8%), low TIBC (238), and low Iron (20).  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  -CBC in AM  -Restart iron when tolerating tube feeds.  Mental disorder: Stable. Mental  status significantly improved.  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  -Restart Methadone 5 mg bid per tube if showing signs of withdrawal.   DTV PPx: Heparin gtt  Dispo: Disposition is deferred at this time, awaiting improvement of current medical problems.  Anticipated discharge in approximately 2-3 day(s).   The patient does have a current PCP (Rich Numberarly Rivet, MD) and [facility] after [facility].  The patient does not have transportation limitations that hinder transportation to clinic appointments.  .Services Needed at time of discharge: Y = Yes, Blank = No PT:   OT:   RN:   Equipment:   Other:     LOS: 3 days   Courtney ParisEden W Aleshka Corney, MD 05/18/2014, 7:33 AM

## 2014-05-18 NOTE — Progress Notes (Signed)
[facility] Night Float Progress Note  S: Patient reporting epigastric abdominal pain with onset of several hours ago. Patient states that she had non-bilious, non-bloody vomiting once this morning and also had a bowel movement this morning as well. Patient has not had any tube feeds today. Patient was previously not conversive, but has had improvement in mental state today. Patient denies having abdominal pain similar to this previously. Patient asking for pain pills to help with the pain.   O:  Filed Vitals:   05/18/14 2300  BP: 105/65  Pulse: 104  Temp:   Resp: 21   General: resting in bed, In no acute distress Cardiac: regular rhythm, tachycardic, no rubs, murmurs or gallops Pulm: clear to auscultation bilaterally, moving normal volumes of air Abd: feeding tube site dressing clean dry and intact, soft, nondistended, mild tenderness to palpation in the epigastrium  A/P: 48 y/o F w/ PMHx of DM type I, microcytic anemia, HLD, CAD, HTN, trach and PEG dependent, w/ h/o polysubstance abuse admitted for DKA, now with new reports of abdominal pain. It is unclear whether the onset of pain is completely new or somewhat secondary to the improvement in patient's mental status. Abdominal exam reassuring. Patient had a bowel movement earlier in the day. Patient planned for catheterization tomorrow.  - Abdominal x-ray  - EKG  - acetaminophen suspension via tube

## 2014-05-18 NOTE — Progress Notes (Signed)
Subjective: Mental status improved, answering questions, eyes open, responsive. She says she has been without pain. She was asking if she could go home today.  Objective: Vital signs in last 24 hours: Filed Vitals:   05/18/14 1100 05/18/14 1119 05/18/14 1146 05/18/14 1200  BP: 117/90  117/90 120/85  Pulse: 102  96 102  Temp:  98.7 F (37.1 C)    TempSrc:  Oral    Resp: 26  26 26   Height:      Weight:      SpO2: 96%  99% 100%   Weight change:   Intake/Output Summary (Last 24 hours) at 05/18/14 1220 Last data filed at 05/18/14 1200  Gross per 24 hour  Intake 2003.91 ml  Output   1865 ml  Net 138.91 ml   Physical Exam: General: Continues to be awake, alert, answering yes/no questions.  HEENT: PERRL, EOMI. Moist mucus membranes. Trach w/ O2 via collar.  Neck: Full range of motion without pain, supple, no lymphadenopathy or carotid bruits Lungs: Scattered coarse breath sounds, otherwise clear to auscultation. No wheezing.  Heart: Tachycardic, no murmurs, gallops, or rubs Abdomen: Soft, non-tender, non-distended, BS +. Left-sided PEG tube in place. Clean, dry, intact.  Extremities: No cyanosis, clubbing, or edema Neurologic: Alert and responds to questions appropriately, it is difficult to interpret her words given tracheostomy. Cranial nerves generally intact, strength grossly intact, sensation intact to light touch   Lab Results: Basic Metabolic Panel:  Recent Labs Lab 05/17/14 0003  05/18/14 0551 05/18/14 0918  NA 145  < > 140 139  K 3.9  < > 3.4* 3.1*  CL 111  < > 102 100  CO2 20  < > 24 25  GLUCOSE 126*  < > 120* 118*  BUN 34*  < > 9 9  CREATININE 0.97  < > 0.56 0.58  CALCIUM 7.9*  < > 7.3* 7.3*  MG 1.9  --   --   --   < > = values in this interval not displayed. Liver Function Tests:  Recent Labs Lab 05/15/14 1725  AST 24  ALT 12  ALKPHOS 101  BILITOT 0.2*  PROT 8.5*  ALBUMIN 4.4   CBC:  Recent Labs Lab 05/15/14 1753  05/17/14 0826  05/18/14 0320  WBC 28.0*  < > 15.1* 9.2  NEUTROABS 22.9*  --   --   --   HGB 10.3*  < > 10.5* 10.6*  HCT 34.1*  < > 31.5* 31.9*  MCV 65.5*  < > 61.3* 59.8*  PLT 382  < > 245 196  < > = values in this interval not displayed. Cardiac Enzymes:  Recent Labs Lab 05/16/14 1815 05/17/14 0003 05/17/14 0826  TROPONINI 2.68* 1.24* 0.53*   CBG:  Recent Labs Lab 05/17/14 2303 05/18/14 0007 05/18/14 0111 05/18/14 0201 05/18/14 0306 05/18/14 0350  GLUCAP 131* 126* 118* 112* 129* 123*    Thyroid Function Tests:  Recent Labs Lab 05/16/14 0545  TSH 1.700   Anemia Panel:  Recent Labs Lab 05/16/14 1015  VITAMINB12 >2000*  FOLATE >20.0  FERRITIN 199  TIBC 238*  IRON 20*  RETICCTPCT 0.9   Urine Drug Screen: Drugs of Abuse     Component Value Date/Time   LABOPIA NONE DETECTED 05/15/2014 2100   COCAINSCRNUR NONE DETECTED 05/15/2014 2100   LABBENZ NONE DETECTED 05/15/2014 2100   AMPHETMU NONE DETECTED 05/15/2014 2100   THCU NONE DETECTED 05/15/2014 2100   LABBARB NONE DETECTED 05/15/2014 2100    Urinalysis:  Recent Labs Lab 05/15/14 2100  COLORURINE YELLOW  LABSPEC 1.022  PHURINE 5.5  GLUCOSEU >1000*  HGBUR SMALL*  BILIRUBINUR NEGATIVE  KETONESUR >80*  PROTEINUR NEGATIVE  UROBILINOGEN 0.2  NITRITE NEGATIVE  LEUKOCYTESUR NEGATIVE    Micro Results: Recent Results (from the past 240 hour(s))  Blood culture (routine x 2)     Status: None (Preliminary result)   Collection Time: 05/15/14  5:53 PM  Result Value Ref Range Status   Specimen Description BLOOD ARM RIGHT  Final   Special Requests BOTTLES DRAWN AEROBIC AND ANAEROBIC 10CC  Final   Culture  Setup Time   Final    05/16/2014 01:20 Performed at [facility]    Culture   Final           BLOOD CULTURE RECEIVED NO GROWTH TO DATE CULTURE WILL BE HELD FOR 5 DAYS BEFORE ISSUING A FINAL NEGATIVE REPORT Performed at [facility]    Report Status PENDING  Incomplete  Blood culture (routine  x 2)     Status: None (Preliminary result)   Collection Time: 05/15/14  6:09 PM  Result Value Ref Range Status   Specimen Description BLOOD FOREARM LEFT  Final   Special Requests BOTTLES DRAWN AEROBIC AND ANAEROBIC 5CC  Final   Culture  Setup Time   Final    05/16/2014 01:22 Performed at [facility]    Culture   Final           BLOOD CULTURE RECEIVED NO GROWTH TO DATE CULTURE WILL BE HELD FOR 5 DAYS BEFORE ISSUING A FINAL NEGATIVE REPORT Performed at [facility]    Report Status PENDING  Incomplete  MRSA PCR Screening     Status: Abnormal   Collection Time: 05/16/14  1:36 AM  Result Value Ref Range Status   MRSA by PCR POSITIVE (A) NEGATIVE Final    Comment:        The GeneXpert MRSA Assay (FDA approved for NASAL specimens only), is one component of a comprehensive MRSA colonization surveillance program. It is not intended to diagnose MRSA infection nor to guide or monitor treatment for MRSA infections. RESULT CALLED TO, READ BACK BY AND VERIFIED WITH: CALLED TO RN AVERY DANIEL [id number] @0445  THANEY  Culture, respiratory (NON-Expectorated)     Status: None (Preliminary result)   Collection Time: 05/16/14  8:35 AM  Result Value Ref Range Status   Specimen Description TRACHEAL ASPIRATE  Final   Special Requests NONE  Final   Gram Stain PENDING  Incomplete   Culture   Final    ABUNDANT STAPHYLOCOCCUS AUREUS Note: RIFAMPIN AND GENTAMICIN SHOULD NOT BE USED AS SINGLE DRUGS FOR TREATMENT OF STAPH INFECTIONS. Performed at [facility]    Report Status PENDING  Incomplete   Recent Results (from the past 240 hour(s))  Blood culture (routine x 2)     Status: None (Preliminary result)   Collection Time: 05/15/14  5:53 PM  Result Value Ref Range Status   Specimen Description BLOOD ARM RIGHT  Final   Special Requests BOTTLES DRAWN AEROBIC AND ANAEROBIC 10CC  Final   Culture  Setup Time   Final    05/16/2014 01:20 Performed at [facility]     Culture   Final           BLOOD CULTURE RECEIVED NO GROWTH TO DATE CULTURE WILL BE HELD FOR 5 DAYS BEFORE ISSUING A FINAL NEGATIVE REPORT Performed at [facility]    Report Status PENDING  Incomplete  Blood culture (routine x 2)     Status: None (Preliminary result)   Collection Time: 05/15/14  6:09 PM  Result Value Ref Range Status   Specimen Description BLOOD FOREARM LEFT  Final   Special Requests BOTTLES DRAWN AEROBIC AND ANAEROBIC 5CC  Final   Culture  Setup Time   Final    05/16/2014 01:22 Performed at [facility]    Culture   Final           BLOOD CULTURE RECEIVED NO GROWTH TO DATE CULTURE WILL BE HELD FOR 5 DAYS BEFORE ISSUING A FINAL NEGATIVE REPORT Performed at [facility]    Report Status PENDING  Incomplete  MRSA PCR Screening     Status: Abnormal   Collection Time: 05/16/14  1:36 AM  Result Value Ref Range Status   MRSA by PCR POSITIVE (A) NEGATIVE Final    Comment:        The GeneXpert MRSA Assay (FDA approved for NASAL specimens only), is one component of a comprehensive MRSA colonization surveillance program. It is not intended to diagnose MRSA infection nor to guide or monitor treatment for MRSA infections. RESULT CALLED TO, READ BACK BY AND VERIFIED WITH: CALLED TO RN AVERY DANIEL [phone] @0445  THANEY  Culture, respiratory (NON-Expectorated)     Status: None (Preliminary result)   Collection Time: 05/16/14  8:35 AM  Result Value Ref Range Status   Specimen Description TRACHEAL ASPIRATE  Final   Special Requests NONE  Final   Gram Stain PENDING  Incomplete   Culture   Final    Culture reincubated for better growth Performed at [facility]    Report Status PENDING  Incomplete   Studies/Results: Dg Chest Port 1 View  05/17/2014   CLINICAL DATA:  Diabetic ketoacidosis  EXAM: PORTABLE CHEST - 1 VIEW  COMPARISON:  May 17, 2014 study obtained earlier in the day  FINDINGS: Tracheostomy catheter tip is 2.6 cm above the  carina. No pneumothorax. There is persistent interstitial edema with alveolar edema in the left parahilar region, stable. No new opacity. No change in cardiac silhouette. No adenopathy.  IMPRESSION: Findings felt to represent a degree of congestive heart failure. No new opacity. No change in cardiac silhouette.   Electronically Signed   By: Bretta Bang M.D.   On: 05/17/2014 11:20   Dg Chest Port 1 View  05/17/2014   CLINICAL DATA:  Productive cough ; hypertension  EXAM: PORTABLE CHEST - 1 VIEW  COMPARISON:  May 15, 2014  FINDINGS: Tracheostomy catheter tip is 3.3 cm above the carina. No pneumothorax. There is generalized interstitial edema with patchy alveolar edema in the left perihilar region. Lungs elsewhere clear. There is cardiomegaly, mild, with pulmonary venous hypertension. No adenopathy. No bone lesions.  IMPRESSION: Evidence of congestive heart failure. Tracheostomy as described without pneumothorax.   Electronically Signed   By: Bretta Bang M.D.   On: 05/17/2014 11:07   Dg Abd Portable 1v  05/16/2014   CLINICAL DATA:  PEG placement  EXAM: PORTABLE ABDOMEN - 1 VIEW  COMPARISON:  08/22/2012  FINDINGS: Scattered large and small bowel gas is noted. A gastrostomy catheter is noted within the gastric lumen. No other focal abnormality is noted.  IMPRESSION: Gastrostomy catheter in place.   Electronically Signed   By: Alcide Clever M.D.   On: 05/16/2014 21:15    Medications: I have reviewed the patient's current medications. Scheduled Meds: . antiseptic oral rinse  7 mL Mouth Rinse q12n4p  .  aspirin EC  81 mg Oral Daily  . atorvastatin  80 mg Oral q1800  . chlorhexidine  15 mL Mouth Rinse BID  . Chlorhexidine Gluconate Cloth  6 each Topical Q0600  . insulin glargine  7 Units Subcutaneous Once  . metoprolol tartrate  12.5 mg Oral BID   Or  . metoprolol tartrate  12.5 mg Oral BID  . mupirocin ointment  1 application Nasal BID   Continuous Infusions: . dextrose 5 % and 0.9%  NaCl 10 mL/hr at 05/18/14 1100  . [START ON 05/19/2014] feeding supplement (GLUCERNA 1.2 CAL)    . heparin 700 Units/hr (05/18/14 1016)   PRN Meds:.dextrose, ipratropium-albuterol, sodium chloride, sodium chloride  Assessment/Plan: 48 y/o F w/ PMHx of DM type I, microcytic anemia, HLD, CAD, HTN, trach and PEG dependent, w/ h/o polysubstance abuse, admitted for severe DKA.   DKA w/ Acute Encephalopathy: Significantly improved. HCO3 24, AG 14 most recently. Still on Insulin gtt given inability to take po as patient is trach dependent.  -Transition to [place] Lantus 7U. Will receive one dose for now and will monitor for hypoglycemia. Will eventually transition to home dose of Lantus 10 units bid + ISS at home).  - Will hold fluids in setting of pulmonary edema as seen on CXR -Keep NPO for possible cardiac catheterization tomorrow. Appreciate diet recommendations. - Will place PICC today given stick difficulties. Continue BMP q2h for now -K supplementation as necessary per tube (limited IV access) - Hypoglycemia protocol, intervene with D10W infusion if necessary  Anion Gap Metabolic Acidosis w/ Respiratory Compensation: Improving. Tachypnea improved and AG is closing, at 14 now. Most likely d/t DKA in addition to lactic acidosis on admission (5.23 initially, now resolved).  -Continue to monitor via BMP q2h -IVF as above  NSTEMI: Patient w/ troponin elevation, peaking at 2.68. No significant EKG changes suggesting ischemia, however, given risk factors, cardiology is following, Heparin gtt started. Will likely get cath tomorrow given stablizing medical condition -Continue Heparin gtt -ECHO pending - Cath likely tomorrow -Continue Lipitor, ASA, Lopressor 12.5 mg bid  Leukocytosis: Could be leukemoid reaction in the setting of DKA and/or infection near tracheostomy given positive culture for Staph. CXR not suggestive of infiltrate.  -Non-expectorated respiratory culture shows abundant staph aureus.  Will start vancomycin 1g qDay via PICC.  -Blood cultures no growth to date -CBC in AM  AKI: Resolved.  -IVF as above -BMP q2h  Hypotension: Resolved. Most likely initially 2/2 dehydration and volume depletion in the setting of DKA.  -Continue to monitor -IVF as above  Microcytic anemia: Stable. Hb 10.5 most recently. Patient on ferrous sulfate 325 mg daily at home. Repeat Iron studies show low T Sat (8%), low TIBC (238), and low Iron (20).  -CBC in AM  -Restart iron when tolerating tube feeds.  Mental disorder: Stable. Mental status improved.  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  - If she shows signs of withdrawal, restart Methadone 5 mg bid per tube  Screening - HIV and Viral Hepatitis panel in setting of previous IVDU. Last HIV and Hep panel was 08/20/2012, which was negative and nonreactive.  DTV PPx: Heparin gtt  Dispo: Disposition is deferred at this time, awaiting improvement of current medical problems.  Anticipated discharge in approximately 2-3 day(s).   The patient does have a  current PCP (Rich Number, MD) and does need an [facility] hospital follow-up appointment after discharge.  The patient does not have transportation limitations that hinder transportation to clinic appointments.  .Services Needed at time of discharge: Y = Yes, Blank = No PT:   OT:   RN:   Equipment:   Other:     LOS: 3 days   Karl Pock, Med Student 05/18/2014, 12:20 PM
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
## 2014-05-18 NOTE — Plan of Care (Signed)
Problem: Phase I Progression Outcomes Goal: Nausea/vomiting controlled with antiemetics Outcome: Completed/Met Date Met:  05/18/14
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
## 2014-05-18 NOTE — Progress Notes (Signed)
Critical potassium of 6.8 however sample was hemolyzed per lab. MD notified.  EKG ordered.  Will recheck BMET at 2200 per the regularly scheduled lab draw.  Will continue to monitor the patient carefully.

## 2014-05-18 NOTE — Consult Note (Signed)
ANTICOAGULATION CONSULT NOTE   Pharmacy Consult for Heparin Indication: chest pain/ACS  No Known Allergies  Patient Measurements: Height: 5\' 1"  (154.9 cm) Weight: 113 lb 12.1 oz (51.6 kg) IBW/kg (Calculated) : 47.8 Heparin Dosing Weight: 51.6kg  Vital Signs: Temp: 98.1 F (36.7 C) (11/02 0800) Temp Source: Axillary (11/02 0800) BP: 125/99 mmHg (11/02 0900) Pulse Rate: 98 (11/02 0900)  Labs:  Recent Labs  05/16/14 0140  05/16/14 1815  05/17/14 0003  05/17/14 0826  05/17/14 1723  05/17/14 2120 05/18/14 0320 05/18/14 0551  HGB 9.2*  --   --   --   --   --  10.5*  --   --   --   --  10.6*  --   HCT 30.2*  --   --   --   --   --  31.5*  --   --   --   --  31.9*  --   PLT 253  --   --   --   --   --  245  --   --   --   --  196  --   HEPARINUNFRC  --   --   --   --   --   --  0.27*  --  0.68  --   --  0.45  --   CREATININE 2.37*  < > 1.08  < > 0.97  < > 0.73  < >  --   < > 0.69 0.58 0.56  TROPONINI 0.50*  --  2.68*  --  1.24*  --  0.53*  --   --   --   --   --   --   < > = values in this interval not displayed.  Estimated Creatinine Clearance: 64.2 mL/min (by C-G formula based on Cr of 0.56).  Assessment: 48yof on IV heparin for positive troponins.  Heparin level remains therapeutic at 0.45 units/ml.  CBC stable.    Goal of Therapy:  Heparin level 0.3-0.7 units/ml Monitor platelets by anticoagulation protocol: Yes   Plan:   Cont  heparin at 700 units/hr  f/u plans for cath  Daily heparin level and CBC  [name] 05/18/2014,10:13 AM
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
## 2014-05-18 NOTE — Evaluation (Signed)
Physical Therapy Evaluation Patient Details Name: [name] MRN: [id number] DOB: 1966/03/03 Today's Date: 05/18/2014   History of Present Illness  In brief, 48 y/o female with PMH of type 1 DM, HTN, s/p trach/PEG following substance abuse, hypothyroidism who p/w AMS. Pt was discharged from SNF and lives with her mother. Per mother she was refusing insulin and was altered several hours prior to coming to ED with DKA, NSTEMI   Clinical Impression  Pt very pleasant and talking around trach without PMV. Pt with generalized weakness with decreased activity and will benefit from acute therapy to maximize mobility, function, gait and activity to maximize function and independence to decrease burden of care and return pt to PLOF. Recommend daily mobility with nursing assist.      Follow Up Recommendations Home health PT;Supervision for mobility/OOB    Equipment Recommendations  None recommended by PT    Recommendations for Other Services       Precautions / Restrictions Precautions Precaution Comments: trach, peg Restrictions Weight Bearing Restrictions: No      Mobility  Bed Mobility Overal bed mobility: Needs Assistance Bed Mobility: Supine to Sit     Supine to sit: Min assist     General bed mobility comments: cues for sequence and safety with assist to manage lines  Transfers Overall transfer level: Needs assistance   Transfers: Sit to/from Stand Sit to Stand: Min assist         General transfer comment: cues for hand placement, safety and sequence  Ambulation/Gait Ambulation/Gait assistance: Min assist Ambulation Distance (Feet): 50 Feet Assistive device: 1 person hand held assist Gait Pattern/deviations: Step-through pattern;Decreased stride length   Gait velocity interpretation: <1.8 ft/sec, indicative of risk for recurrent falls General Gait Details: min assist x 2 for balance  Stairs            Wheelchair Mobility    Modified Rankin (Stroke  Patients Only)       Balance Overall balance assessment: Needs assistance   Sitting balance-Leahy Scale: Good       Standing balance-Leahy Scale: Fair                               Pertinent Vitals/Pain Pain Assessment: No/denies pain  HR 91-126 with activity sats 90-96% on RA    Home Living Family/patient expects to be discharged to:: Private residence Living Arrangements: Parent Available Help at Discharge: Family;Available PRN/intermittently Type of Home: Apartment Home Access: Stairs to enter Entrance Stairs-Rails: Left Entrance Stairs-Number of Steps: 2 Home Layout: One level Home Equipment: Walker - 2 wheels      Prior Function Level of Independence: Independent               Hand Dominance        Extremity/Trunk Assessment   Upper Extremity Assessment: Generalized weakness           Lower Extremity Assessment: Generalized weakness      Cervical / Trunk Assessment: Normal  Communication   Communication: Tracheostomy  Cognition Arousal/Alertness: Awake/alert Behavior During Therapy: WFL for tasks assessed/performed Overall Cognitive Status: Impaired/Different from baseline Area of Impairment: Orientation Orientation Level: Time                  General Comments      Exercises        Assessment/Plan    PT Assessment Patient needs continued PT services  PT Diagnosis Difficulty walking;Generalized weakness   PT  Problem List Decreased strength;Decreased activity tolerance;Decreased mobility;Decreased balance  PT Treatment Interventions Gait training;Therapeutic activities;DME instruction;Functional mobility training;Therapeutic exercise;Stair training;Patient/family education   PT Goals (Current goals can be found in the Care Plan section) Acute Rehab PT Goals Patient Stated Goal: be able to go home PT Goal Formulation: With patient Time For Goal Achievement: 06/01/14 Potential to Achieve Goals: Good     Frequency Min 3X/week   Barriers to discharge Decreased caregiver support mom works part time    Co-evaluation               End of Session   Activity Tolerance: Patient tolerated treatment well Patient left: in chair;with call bell/phone within reach;with nursing/sitter in room Nurse Communication: Mobility status         Time: [id number] PT Time Calculation (min): 18 min   Charges:   PT Evaluation $Initial PT Evaluation Tier I: 1 Procedure PT Treatments $Therapeutic Activity: 8-22 mins   PT G Codes:          [name], [name] 05/18/2014, 11:17 AM [name], PT [phone]

## 2014-05-18 NOTE — Progress Notes (Signed)
Patient ID: [name], female   DOB: 1966/05/13, 48 y.o.   MRN: [id number]  Cardiology Progress note  Patient Name: [name] Date of Encounter: 05/18/2014     Principal Problem:   DKA (diabetic ketoacidosis) Active Problems:   Diabetes mellitus type 1, uncontrolled, insulin dependent   HLD (hyperlipidemia)   Microcytic anemia   Substance abuse   Mental disorder   Hypotension   Metabolic acidosis with respiratory alkalosis   Acute encephalopathy   EKG abnormalities   NSTEMI (non-ST elevated myocardial infarction)   Diabetic ketoacidosis without coma associated with type 1 diabetes mellitus    SUBJECTIVE    CURRENT MEDS . antiseptic oral rinse  7 mL Mouth Rinse q12n4p  . aspirin EC  81 mg Oral Daily  . atorvastatin  80 mg Oral q1800  . chlorhexidine  15 mL Mouth Rinse BID  . Chlorhexidine Gluconate Cloth  6 each Topical Q0600  . metoprolol tartrate  12.5 mg Oral BID   Or  . metoprolol tartrate  12.5 mg Oral BID  . mupirocin ointment  1 application Nasal BID    OBJECTIVE  Filed Vitals:   05/18/14 0700 05/18/14 0754 05/18/14 0800 05/18/14 0900  BP: 128/88  116/84 125/99  Pulse: 99 89 106 98  Temp:   98.1 F (36.7 C)   TempSrc:   Axillary   Resp: 20 31 30 17   Height:      Weight:      SpO2: 99% 99% 97% 98%    Intake/Output Summary (Last 24 hours) at 05/18/14 0950 Last data filed at 05/18/14 0900  Gross per 24 hour  Intake 2229.53 ml  Output   1865 ml  Net 364.53 ml   Filed Weights   05/16/14 1500  Weight: 113 lb 12.1 oz (51.6 kg)    PHYSICAL EXAM  General: Pleasant, trach tube in place, NAD. Neuro: Alert and oriented X 3. Moves all extremities spontaneously. Psych: Normal affect. HEENT:  Normal  Neck: Supple without bruits or JVD. Trach tube in place Lungs:  Resp regular and unlabored, CTA. Heart: RRR no s3, s4, or murmurs. Abdomen: Soft, non-tender, non-distended, BS + x 4.  Extremities: No clubbing, cyanosis or edema.  DP/PT/Radials 2+ and equal bilaterally.  Accessory Clinical Findings  CBC  Recent Labs  05/15/14 1753  05/17/14 0826 05/18/14 0320  WBC 28.0*  < > 15.1* 9.2  NEUTROABS 22.9*  --   --   --   HGB 10.3*  < > 10.5* 10.6*  HCT 34.1*  < > 31.5* 31.9*  MCV 65.5*  < > 61.3* 59.8*  PLT 382  < > 245 196  < > = values in this interval not displayed. Basic Metabolic Panel  Recent Labs  05/17/14 0003  05/18/14 0320 05/18/14 0551  NA 145  < > 140 140  K 3.9  < > 4.1 3.4*  CL 111  < > 103 102  CO2 20  < > 22 24  GLUCOSE 126*  < > 126* 120*  BUN 34*  < > 10 9  CREATININE 0.97  < > 0.58 0.56  CALCIUM 7.9*  < > 7.4* 7.3*  MG 1.9  --   --   --   < > = values in this interval not displayed. Liver Function Tests  Recent Labs  05/15/14 1725  AST 24  ALT 12  ALKPHOS 101  BILITOT 0.2*  PROT 8.5*  ALBUMIN 4.4   No results for input(s): LIPASE, AMYLASE in  the last 72 hours. Cardiac Enzymes  Recent Labs  05/16/14 1815 05/17/14 0003 05/17/14 0826  TROPONINI 2.68* 1.24* 0.53*   BNP Invalid input(s): POCBNP D-Dimer No results for input(s): DDIMER in the last 72 hours. Hemoglobin A1C  Recent Labs  05/16/14 1015  HGBA1C 9.5*   Fasting Lipid Panel No results for input(s): CHOL, HDL, LDLCALC, TRIG, CHOLHDL, LDLDIRECT in the last 72 hours. Thyroid Function Tests  Recent Labs  05/16/14 0545  TSH 1.700    TELE  Nsr/sinus tachy  Radiology/Studies  Ct Head Wo Contrast  05/15/2014   CLINICAL DATA:  Altered mental status.  EXAM: CT HEAD WITHOUT CONTRAST  TECHNIQUE: Contiguous axial images were obtained from the base of the skull through the vertex without intravenous contrast.  COMPARISON:  None.  FINDINGS: No intra-axial or extra-axial pathologic fluid or blood collection. No mass lesion. No hydrocephalus. No hemorrhage. No acute bony abnormality  IMPRESSION: No acute abnormality.   Electronically Signed   By: Maisie Fushomas  Register   On: 05/15/2014 19:30   Dg Chest Port 1  View  05/17/2014   CLINICAL DATA:  Diabetic ketoacidosis  EXAM: PORTABLE CHEST - 1 VIEW  COMPARISON:  May 17, 2014 study obtained earlier in the day  FINDINGS: Tracheostomy catheter tip is 2.6 cm above the carina. No pneumothorax. There is persistent interstitial edema with alveolar edema in the left parahilar region, stable. No new opacity. No change in cardiac silhouette. No adenopathy.  IMPRESSION: Findings felt to represent a degree of congestive heart failure. No new opacity. No change in cardiac silhouette.   Electronically Signed   By: Bretta BangWilliam  Woodruff M.D.   On: 05/17/2014 11:20   Dg Chest Port 1 View  05/17/2014   CLINICAL DATA:  Productive cough ; hypertension  EXAM: PORTABLE CHEST - 1 VIEW  COMPARISON:  May 15, 2014  FINDINGS: Tracheostomy catheter tip is 3.3 cm above the carina. No pneumothorax. There is generalized interstitial edema with patchy alveolar edema in the left perihilar region. Lungs elsewhere clear. There is cardiomegaly, mild, with pulmonary venous hypertension. No adenopathy. No bone lesions.  IMPRESSION: Evidence of congestive heart failure. Tracheostomy as described without pneumothorax.   Electronically Signed   By: Bretta BangWilliam  Woodruff M.D.   On: 05/17/2014 11:07   Dg Chest Port 1 View  05/15/2014   CLINICAL DATA:  Altered mental status for 1 day. Patient has a mental illness. Unresponsive today.  EXAM: PORTABLE CHEST - 1 VIEW  COMPARISON:  01/18/2013  FINDINGS: Tracheostomy tube in place. Tip is 2.1 cm from the carina. Upper normal heart size. Clear lungs.  IMPRESSION: Tracheostomy tube in place.  No active cardiopulmonary disease.   Electronically Signed   By: Maryclare BeanArt  Hoss M.D.   On: 05/15/2014 18:34   Dg Abd Portable 1v  05/16/2014   CLINICAL DATA:  PEG placement  EXAM: PORTABLE ABDOMEN - 1 VIEW  COMPARISON:  08/22/2012  FINDINGS: Scattered large and small bowel gas is noted. A gastrostomy catheter is noted within the gastric lumen. No other focal abnormality is  noted.  IMPRESSION: Gastrostomy catheter in place.   Electronically Signed   By: Alcide CleverMark  Lukens M.D.   On: 05/16/2014 21:15    ASSESSMENT AND PLAN  1. NSTEMI - she will likely need cath. While this could be a type 2 event, her longstanding diabetes makes the likelihood of CAD much higher.  [facility] NPO p MN - gentle hydration in the am tomorrow. 2. Pulmonary edema -  suspect acute systolic congestive heart failure, but echo is pending so cannot give that diagnosis, yet. BNP 3608. CXR consistent with CHF. Stop IV fluids. Give lasix today to offset fluids for DKA (she is +3L).   2. DKA - as per primary team, but appears to be resolving. Gap is resolved. Transition off insulin gtts today. Will stop IV fluids. 3. Hypokalemia - potassium remains low today. Supplemented today.  Chrystie NoseKenneth C. Zania Kalisz, MD, [facility] TylerFACC Attending Cardiologist [facility]   05/18/2014 9:50 AM

## 2014-05-18 NOTE — Plan of Care (Signed)
Problem: Phase I Progression Outcomes Goal: Pain controlled with appropriate interventions Outcome: Completed/Met Date Met:  05/18/14     

## 2014-05-18 NOTE — Progress Notes (Signed)
CRITICAL VALUE ALERT  Critical value received:  CBG 66  Date of notification:  05/17/14  Time of notification:  66   Blood glucose entered into glucostabilizer.  Per glucostabilizer stopped insulin drip and administered 14 mL D50 and rechecked CBG.  At reassessment CBG 115 and insulin drip restarted.   Dr. Loma NewtonNgo notified of hypoglycemic events.  No further orders. Will continue to monitor patient carefully.

## 2014-05-18 NOTE — Progress Notes (Signed)
Echocardiogram 2D Echocardiogram with Definity has been performed.  [name] 05/18/2014, 3:06 PM

## 2014-05-19 ENCOUNTER — Ambulatory Visit (HOSPITAL_COMMUNITY): Admit: 2014-05-19 | Payer: Self-pay | Admitting: Interventional Cardiology

## 2014-05-19 ENCOUNTER — Encounter (HOSPITAL_COMMUNITY)
Admission: EM | Disposition: A | Discharge status: Home Health | Payer: Self-pay | Source: Home / Self Care | Attending: Infectious Diseases

## 2014-05-19 DIAGNOSIS — I5032 Chronic diastolic (congestive) heart failure: Secondary | ICD-10-CM | POA: Diagnosis present

## 2014-05-19 HISTORY — PX: LEFT AND RIGHT HEART CATHETERIZATION WITH CORONARY ANGIOGRAM: SHX5449

## 2014-05-19 LAB — BASIC METABOLIC PANEL
Anion gap: 14 (ref 5–15)
Anion gap: 12 (ref 5–15)
BUN: 16 mg/dL (ref 6–23)
BUN: 18 mg/dL (ref 6–23)
CO2: 24 mEq/L (ref 19–32)
CO2: 26 mEq/L (ref 19–32)
CREATININE: 0.62 mg/dL (ref 0.50–1.10)
Calcium: 7.5 mg/dL — ABNORMAL LOW (ref 8.4–10.5)
Calcium: 7.4 mg/dL — ABNORMAL LOW (ref 8.4–10.5)
Chloride: 98 mEq/L (ref 96–112)
Chloride: 98 mEq/L (ref 96–112)
Creatinine, Ser: 0.68 mg/dL (ref 0.50–1.10)
GFR calc Af Amer: >90 mL/min (ref >90)
GFR calc non Af Amer: >90 mL/min (ref >90)
GLUCOSE: 255 mg/dL — AB (ref 70–99)
GLUCOSE: 195 mg/dL — AB (ref 70–99)
POTASSIUM: 3.5 meq/L — AB (ref 3.7–5.3)
Potassium: 3.7 mEq/L (ref 3.7–5.3)
Sodium: 136 mEq/L — ABNORMAL LOW (ref 137–147)
Sodium: 136 mEq/L — ABNORMAL LOW (ref 137–147)

## 2014-05-19 LAB — CBC
HCT: 34.6 % — ABNORMAL LOW (ref 36.0–46.0)
Hemoglobin: 11.2 g/dL — ABNORMAL LOW (ref 12.0–15.0)
MCH: 19.6 pg — AB (ref 26.0–34.0)
MCHC: 32.4 g/dL (ref 30.0–36.0)
MCV: 60.7 fL — ABNORMAL LOW (ref 78.0–100.0)
PLATELETS: 180 10*3/uL (ref 150–400)
RBC: 5.7 MIL/uL — AB (ref 3.87–5.11)
RDW: 17.2 % — AB (ref 11.5–15.5)
WBC: 5.2 10*3/uL (ref 4.0–10.5)

## 2014-05-19 LAB — CULTURE, RESPIRATORY

## 2014-05-19 LAB — HEPATITIS PANEL, ACUTE
HCV Ab: NEGATIVE
Hep A IgM: NONREACTIVE
Hep B C IgM: NONREACTIVE
Hepatitis B Surface Ag: NEGATIVE

## 2014-05-19 LAB — GLUCOSE, CAPILLARY
GLUCOSE-CAPILLARY: 240 mg/dL — AB (ref 70–99)
GLUCOSE-CAPILLARY: 334 mg/dL — AB (ref 70–99)
GLUCOSE-CAPILLARY: 195 mg/dL — AB (ref 70–99)
GLUCOSE-CAPILLARY: 160 mg/dL — AB (ref 70–99)
Glucose-Capillary: 190 mg/dL — ABNORMAL HIGH (ref 70–99)
Glucose-Capillary: 280 mg/dL — ABNORMAL HIGH (ref 70–99)

## 2014-05-19 LAB — HEPARIN LEVEL (UNFRACTIONATED)
Heparin Unfractionated: 0.18 IU/mL — ABNORMAL LOW (ref 0.30–0.70)
Heparin Unfractionated: 0.16 IU/mL — ABNORMAL LOW (ref 0.30–0.70)

## 2014-05-19 LAB — POCT I-STAT 3, ART BLOOD GAS (G3+)
ACID-BASE EXCESS: 1 mmol/L (ref 0.0–2.0)
Bicarbonate: 25.5 mEq/L — ABNORMAL HIGH (ref 20.0–24.0)
O2 SAT: 99 %
TCO2: 27 mmol/L (ref 0–100)
pCO2 arterial: 38.2 mmHg (ref 35.0–45.0)
pH, Arterial: 7.433 (ref 7.350–7.450)
pO2, Arterial: 118 mmHg — ABNORMAL HIGH (ref 80.0–100.0)

## 2014-05-19 LAB — POCT ACTIVATED CLOTTING TIME: ACTIVATED CLOTTING TIME: 129 s

## 2014-05-19 LAB — CULTURE, RESPIRATORY W GRAM STAIN

## 2014-05-19 LAB — HIV ANTIBODY (ROUTINE TESTING W REFLEX): HIV 1&2 Ab, 4th Generation: NONREACTIVE

## 2014-05-19 SURGERY — LEFT AND RIGHT HEART CATHETERIZATION WITH CORONARY ANGIOGRAM
Anesthesia: LOCAL

## 2014-05-19 MED ORDER — SODIUM CHLORIDE 0.9 % IV SOLN
INTRAVENOUS | Status: AC
  Administered 2014-05-19: 18:00:00 via INTRAVENOUS

## 2014-05-19 MED ORDER — NITROGLYCERIN 1 MG/10 ML FOR IR/CATH LAB
INTRA_ARTERIAL | Status: AC
  Filled 2014-05-19: qty 10

## 2014-05-19 MED ORDER — SODIUM CHLORIDE 0.9 % IV SOLN: 250.0000 mL | INTRAVENOUS | Status: DC | PRN

## 2014-05-19 MED ORDER — MIDAZOLAM HCL 2 MG/2ML IJ SOLN
INTRAMUSCULAR | Status: AC
  Filled 2014-05-19: qty 2

## 2014-05-19 MED ORDER — SODIUM CHLORIDE 0.9 % IJ SOLN
3.0000 mL | Freq: Two times a day (BID) | INTRAMUSCULAR | Status: DC
  Administered 2014-05-19: 3 mL via INTRAVENOUS

## 2014-05-19 MED ORDER — JEVITY 1.2 CAL PO LIQD
1000.0000 mL | ORAL | Status: DC
  Filled 2014-05-19 (×2): qty 1000

## 2014-05-19 MED ORDER — GLUCERNA 1.2 CAL PO LIQD
1000.0000 mL | ORAL | Status: DC
  Administered 2014-05-19: 1000 mL
  Filled 2014-05-19 (×4): qty 1000

## 2014-05-19 MED ORDER — HEPARIN (PORCINE) IN NACL 2-0.9 UNIT/ML-% IJ SOLN
INTRAMUSCULAR | Status: AC
  Filled 2014-05-19: qty 1000

## 2014-05-19 MED ORDER — ONDANSETRON HCL 4 MG/2ML IJ SOLN: 4.0000 mg | Freq: Four times a day (QID) | INTRAMUSCULAR | Status: DC | PRN

## 2014-05-19 MED ORDER — INSULIN GLARGINE 100 UNIT/ML ~~LOC~~ SOLN
7.0000 [IU] | Freq: Every day | SUBCUTANEOUS | Status: DC
  Administered 2014-05-19: 7 [IU] via SUBCUTANEOUS
  Filled 2014-05-19 (×2): qty 0.07

## 2014-05-19 MED ORDER — SODIUM CHLORIDE 0.9 % IJ SOLN: 3.0000 mL | INTRAMUSCULAR | Status: DC | PRN

## 2014-05-19 MED ORDER — FENTANYL CITRATE 0.05 MG/ML IJ SOLN
INTRAMUSCULAR | Status: AC
  Filled 2014-05-19: qty 2

## 2014-05-19 MED ORDER — FUROSEMIDE 10 MG/ML IJ SOLN
40.0000 mg | INTRAMUSCULAR | Status: AC
  Administered 2014-05-19: 40 mg via INTRAVENOUS
  Filled 2014-05-19: qty 4

## 2014-05-19 MED ORDER — LIDOCAINE HCL (PF) 1 % IJ SOLN
INTRAMUSCULAR | Status: AC
  Filled 2014-05-19: qty 30

## 2014-05-19 MED ORDER — HEPARIN (PORCINE) IN NACL 2-0.9 UNIT/ML-% IJ SOLN
INTRAMUSCULAR | Status: AC
  Filled 2014-05-19: qty 500

## 2014-05-19 NOTE — Interval H&P Note (Signed)
Cath Lab Visit (complete for each Cath Lab visit)  Clinical Evaluation Leading to the Procedure:   ACS: Yes.    Non-ACS:    Anginal Classification: No Symptoms  Anti-ischemic medical therapy: Minimal Therapy (1 class of medications)  Non-Invasive Test Results: No non-invasive testing performed  Prior CABG: No previous CABG      History and Physical Interval Note:  05/19/2014 1:09 PM  [name]  has presented today for surgery, with the diagnosis of non stemi  The various methods of treatment have been discussed with the patient and family. After consideration of risks, benefits and other options for treatment, the patient has consented to  Procedure(s): LEFT AND RIGHT HEART CATHETERIZATION WITH CORONARY ANGIOGRAM (N/A) as a surgical intervention .  The patient's history has been reviewed, patient examined, no change in status, stable for surgery.  I have reviewed the patient's chart and labs.  Questions were answered to the patient's satisfaction.     Lesleigh NoeSMITH III,HENRY W

## 2014-05-19 NOTE — Clinical Social Work Note (Signed)
CSW received referral for patient stating she had some financial concerns.  Discussed with patient if she had any financial concerns patient stated no she does not.  Patient stated she did not know why there would have been a referral for her due to financial concerns.  CSW to sign off please re-consult if social work needs arise.  Ervin KnackEric R. Malvern Kadlec, MSW, [employer]CSWA [phone]

## 2014-05-19 NOTE — Progress Notes (Signed)
OT Cancellation Note  Patient Details Name: [name] MRN: [id number] DOB: 1965-08-10   Cancelled Treatment:    Reason Eval/Treat Not Completed: Fatigue/lethargy limiting ability to participate.  Pt just back to bed and fatigued.  Will reattempt.   [name], Nicole Schmuhl M  [name], [name], [name]

## 2014-05-19 NOTE — Progress Notes (Signed)
Inpatient Diabetes Program Recommendations  AACE/ADA: New Consensus Statement on Inpatient Glycemic Control (2013)  Target Ranges:  Prepandial:   less than 140 mg/dL      Peak postprandial:   less than 180 mg/dL (1-2 hours)      Critically ill patients:  140 - 180 mg/dL   Reason for Visit: DKA  Diabetes history: Type 1, A1C 9.5% Outpatient Diabetes medications: Lantus 10 units bid, Novolog 0-12 units tid with meals Current orders for Inpatient glycemic control: Novolog 0-9 units q4h   Patient has Type 1 diabetes and requires basal insulin even when she's NPO.  Home dose is Lantus 10 units bid and A1C was 9.5%- may want to continue Lantus 10 units bid while in hospital with first dose starting now.   Susette RacerJulie Caitlain Tweed, RN, BA, MHA, CDE Diabetes Coordinator Inpatient Diabetes Program  [phone] (Team Pager) [phone] Patrcia Dolly([place] Office) 05/19/2014 7:29 AM

## 2014-05-19 NOTE — Progress Notes (Signed)
  Date: 05/19/2014  Patient name: [name]  Medical record number: [id number]  Date of birth: 08/26/1965   This patient has been seen and the plan of care was discussed with the house staff. Please see their note for complete details. I concur with their findings with the following additions/corrections:  Pt c/o hunger.  FSG have been reviewed, they remain erratic.  PEG site clean, PIC RUE clean.  Awaiting cath, my great appreciation to CV.    [name] SmartJeffrey C Hildreth Orsak, MD 05/19/2014, 10:37 AM

## 2014-05-19 NOTE — Progress Notes (Signed)
ANTICOAGULATION CONSULT NOTE - Follow Up Consult  Pharmacy Consult for Heparin  Indication: chest pain/ACS  No Known Allergies  Patient Measurements: Height: 5\' 1"  (154.9 cm) Weight: 113 lb 12.1 oz (51.6 kg) IBW/kg (Calculated) : 47.8  Vital Signs: Temp: 98.1 F (36.7 C) (11/02 2355) Temp Source: Axillary (11/02 2355) BP: 134/83 mmHg (11/03 0300) Pulse Rate: 91 (11/03 0300)  Labs:  Recent Labs  05/16/14 1815  05/17/14 0003  05/17/14 0826  05/17/14 1723  05/18/14 0320  05/18/14 1410 05/18/14 1940 05/19/14 0352  HGB  --   --   --   --  10.5*  --   --   --  10.6*  --   --   --   --   HCT  --   --   --   --  31.5*  --   --   --  31.9*  --   --   --   --   PLT  --   --   --   --  245  --   --   --  196  --   --   --   --   HEPARINUNFRC  --   --   --   < > 0.27*  --  0.68  --  0.45  --   --   --  0.18*  CREATININE 1.08  < > 0.97  < > 0.73  < >  --   < > 0.58  < > 0.58 0.67 0.68  TROPONINI 2.68*  --  1.24*  --  0.53*  --   --   --   --   --   --   --   --   < > = values in this interval not displayed.   Assessment: Heparin for +trop. HL is sub-therapeutic this AM. Other labs as above. No issue per RN.   Goal of Therapy:  Heparin level 0.3-0.7 units/ml Monitor platelets by anticoagulation protocol: Yes   Plan:  -Increase heparin to 850 units/hr -1100 HL -Daily CBC/HL -Monitor for bleeding  Abran DukeLedford, Kayin Osment 05/19/2014,4:48 AM

## 2014-05-19 NOTE — H&P (View-Only) (Signed)
Patient ID: [name], female   DOB: 03-Dec-1965, 48 y.o.   MRN: [id number]  Cardiology Progress note  Patient Name: [name] Date of Encounter: 05/19/2014     Principal Problem:   DKA (diabetic ketoacidosis) Active Problems:   Diabetes mellitus type 1, uncontrolled, insulin dependent   HLD (hyperlipidemia)   Microcytic anemia   Substance abuse   Mental disorder   Hypotension   Metabolic acidosis with respiratory alkalosis   Acute encephalopathy   EKG abnormalities   NSTEMI (non-ST elevated myocardial infarction)   Diabetic ketoacidosis without coma associated with type 1 diabetes mellitus    SUBJECTIVE No events overnight. Breathing has improved somewhat. Net negative 1.3L yesterday. Still +2L positive. Echo shows newly reduced EF to 25-30%.  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   CURRENT MEDS . antiseptic oral rinse  7 mL Mouth Rinse q12n4p  . aspirin EC  81 mg Oral Daily  . atorvastatin  80 mg Oral q1800  . chlorhexidine  15 mL Mouth Rinse BID  . Chlorhexidine Gluconate Cloth  6 each Topical Q0600  . insulin aspart  0-9 Units Subcutaneous 6 times per day  . metoprolol tartrate  12.5 mg Oral BID   Or  . metoprolol tartrate  12.5 mg Oral BID  . mupirocin ointment  1 application Nasal BID  . sodium chloride  10-40 mL Intracatheter Q12H  . sodium chloride  3 mL Intravenous Q12H    OBJECTIVE  Filed Vitals:   05/19/14 0800 05/19/14 0900 05/19/14 0920 05/19/14 0946  BP: 105/61 125/91 125/71 113/78  Pulse: 97 92  94  Temp:      TempSrc:      Resp: 20 19 19    Height:      Weight:      SpO2: 100% 100%      Intake/Output Summary (Last 24 hours) at 05/19/14 1032 Last data filed at 05/19/14 0900  Gross per 24 hour  Intake 588.23 ml  Output   2080 ml  Net -1491.77 ml   Filed Weights   05/16/14 1500 05/19/14 0500  Weight: 113 lb 12.1 oz (51.6 kg) 110 lb 10.7 oz (50.2 kg)    PHYSICAL EXAM  General: Pleasant, trach tube in place, NAD. Neuro: Alert and  oriented X 3. Moves all extremities spontaneously. Psych: Normal affect. HEENT:  Normal  Neck: Supple without bruits or JVD. Trach tube in place Lungs:  Resp regular and unlabored, CTA. Heart: RRR no s3, s4, or murmurs. Abdomen: Soft, non-tender, non-distended, BS + x 4.  Extremities: No clubbing, cyanosis or edema. DP/PT/Radials 2+ and equal bilaterally.  Accessory Clinical Findings  CBC  Recent Labs  05/18/14 0320 05/19/14 0352  WBC 9.2 5.2  HGB 10.6* 11.2*  HCT 31.9* 34.6*  MCV 59.8* 60.7*  PLT 196 180   Basic Metabolic Panel  Recent Labs  05/17/14 0003  05/18/14 1940 05/19/14 0352  NA 145  < > 136* 136*  K 3.9  < > 3.3* 3.7  CL 111  < > 97 98  CO2 20  < > 26 24  GLUCOSE 126*  < > 171* 255*  BUN 34*  < > 11 16  CREATININE 0.97  < > 0.67 0.68  CALCIUM 7.9*  < > 7.3* 7.5*  MG 1.9  --   --   --   < > = values in this interval not displayed. Liver Function Tests No results for input(s): AST, ALT, ALKPHOS, BILITOT, PROT, ALBUMIN in the last 72 hours. No results  for input(s): LIPASE, AMYLASE in the last 72 hours. Cardiac Enzymes  Recent Labs  05/16/14 1815 05/17/14 0003 05/17/14 0826  TROPONINI 2.68* 1.24* 0.53*   BNP Invalid input(s): POCBNP D-Dimer No results for input(s): DDIMER in the last 72 hours. Hemoglobin A1C No results for input(s): HGBA1C in the last 72 hours. Fasting Lipid Panel No results for input(s): CHOL, HDL, LDLCALC, TRIG, CHOLHDL, LDLDIRECT in the last 72 hours. Thyroid Function Tests No results for input(s): TSH, T4TOTAL, T3FREE, THYROIDAB in the last 72 hours.  Invalid input(s): FREET3  TELE  Nsr/sinus tachy  Radiology/Studies  Ct Head Wo Contrast  05/15/2014   CLINICAL DATA:  Altered mental status.  EXAM: CT HEAD WITHOUT CONTRAST  TECHNIQUE: Contiguous axial images were obtained from the base of the skull through the vertex without intravenous contrast.  COMPARISON:  None.  FINDINGS: No intra-axial or extra-axial pathologic  fluid or blood collection. No mass lesion. No hydrocephalus. No hemorrhage. No acute bony abnormality  IMPRESSION: No acute abnormality.   Electronically Signed   By: Maisie Fus  Register   On: 05/15/2014 19:30   Dg Chest Port 1 View  05/17/2014   CLINICAL DATA:  Diabetic ketoacidosis  EXAM: PORTABLE CHEST - 1 VIEW  COMPARISON:  May 17, 2014 study obtained earlier in the day  FINDINGS: Tracheostomy catheter tip is 2.6 cm above the carina. No pneumothorax. There is persistent interstitial edema with alveolar edema in the left parahilar region, stable. No new opacity. No change in cardiac silhouette. No adenopathy.  IMPRESSION: Findings felt to represent a degree of congestive heart failure. No new opacity. No change in cardiac silhouette.   Electronically Signed   By: Bretta Bang M.D.   On: 05/17/2014 11:20   Dg Chest Port 1 View  05/17/2014   CLINICAL DATA:  Productive cough ; hypertension  EXAM: PORTABLE CHEST - 1 VIEW  COMPARISON:  May 15, 2014  FINDINGS: Tracheostomy catheter tip is 3.3 cm above the carina. No pneumothorax. There is generalized interstitial edema with patchy alveolar edema in the left perihilar region. Lungs elsewhere clear. There is cardiomegaly, mild, with pulmonary venous hypertension. No adenopathy. No bone lesions.  IMPRESSION: Evidence of congestive heart failure. Tracheostomy as described without pneumothorax.   Electronically Signed   By: Bretta Bang M.D.   On: 05/17/2014 11:07   Dg Chest Port 1 View  05/15/2014   CLINICAL DATA:  Altered mental status for 1 day. Patient has a mental illness. Unresponsive today.  EXAM: PORTABLE CHEST - 1 VIEW  COMPARISON:  01/18/2013  FINDINGS: Tracheostomy tube in place. Tip is 2.1 cm from the carina. Upper normal heart size. Clear lungs.  IMPRESSION: Tracheostomy tube in place.  No active cardiopulmonary disease.   Electronically Signed   By: Maryclare Bean M.D.   On: 05/15/2014 18:34   Dg Abd Portable 1v  05/19/2014   CLINICAL  DATA:  Pain upper mid abdomen.  EXAM: PORTABLE ABDOMEN - 1 VIEW  COMPARISON:  05/16/2014.  FINDINGS: Left upper quadrant gastrostomy tube again demonstrated. Gas and stool in the colon. No small or large bowel distention. No radiopaque stones. Visualized bones appear intact.  IMPRESSION: Nonobstructive bowel gas pattern.   Electronically Signed   By: Burman Nieves M.D.   On: 05/19/2014 01:28   Dg Abd Portable 1v  05/16/2014   CLINICAL DATA:  PEG placement  EXAM: PORTABLE ABDOMEN - 1 VIEW  COMPARISON:  08/22/2012  FINDINGS: Scattered large and small bowel gas is noted. A gastrostomy catheter is  noted within the gastric lumen. No other focal abnormality is noted.  IMPRESSION: Gastrostomy catheter in place.   Electronically Signed   By: Alcide CleverMark  Lukens M.D.   On: 05/16/2014 21:15   Echo: Study Conclusions  - Left ventricle: The cavity size was normal. Wall thickness was normal. Systolic function was severely reduced. The estimated ejection fraction was in the range of 25% to 30%. Severe hypokinesis of the entireinferolateral, inferior, and inferoseptal myocardium. Hypokinesis of the mid-apicalanteroseptal, anterior, and apical myocardium. Due to tachycardia, there was fusion of early and atrial contributions to ventricular filling. Doppler parameters are consistent with elevated mean left atrial filling pressure. Acoustic contrast opacification revealed no evidence ofthrombus. - Mitral valve: There was mild to moderate regurgitation directed centrally and posteriorly. - Right ventricle: Systolic function was mildly reduced.  Impressions:  - Compared to 2006, LV function is substantially worse.  ASSESSMENT AND PLAN  1. NSTEMI - she will need L/RHC. Echo shows newly reduced EF and multiple wall motion abnormalities. Plan for cath this afternoon. 2. Acute systolic congestive heart failure - Echo yesterday shows newly reduced EF of 25-30% with regional wall motion  abnormalities. 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  Diuresed yesterday, breathing has improved. IVF's stopped.  Give another lasix dose today. 3. DKA - as per primary team, but appears to be resolving. Gap is resolved. Starting long-acting insulin 4. Hypokalemia - repleted.  Chrystie NoseKenneth C. Hilty, MD, [facility] HospitalFACC Attending Cardiologist [facility]   05/19/2014 10:32 AM

## 2014-05-19 NOTE — CV Procedure (Signed)
     Left Heart Catheterization with Coronary Angiography Report  Nicole DistanceLenora [name]  48 y.o.  female Feb 12, 1966  Procedure Date: 05/19/2014 Referring Physician: Zoila ShutterKenneth Hilty, M.D. Primary Cardiologist: Wylene MenK. C. Hilty, M.D.  INDICATIONS: Elevated cardiac markers, non-ST elevation MI. Decreased LV function.  PROCEDURE: 1. Left heart catheterization; 2. Coronary angiography; 3. Left ventriculography  CONSENT:  The risks, benefits, and details of the procedure were explained in detail to the patient. Risks including death, stroke, heart attack, kidney injury, allergy, limb ischemia, bleeding and radiation injury were discussed.  The patient verbalized understanding and wanted to proceed.  Informed written consent was obtained.  PROCEDURE TECHNIQUE:  After Xylocaine anesthesia a 5 French sheath was placed in the right femoral artery with using the modified Seldinger technique.  Coronary angiography was done using a 5 F 5 French A2 multipurpose catheter, JR4, [place] right, 3-D RCA, AR-1, AR-2, catheter.  Left ventriculography was done using the A2 MP catheter and hand injection.   We were never able to enter the right femoral vein. My suspicion is that of the vessel was severely collapsed due to volume depletion.After noting near normal LV function with markedly decreased LV filling pressures, I decided to terminate further attempts at right femoral cannulation.  The patient had a very narrow aortic root. This may cannulation of the ostium of the right coronary impossible. We used multiple catheters as listed above. We will able to get close to the vessel well enough to visualize that it was widely patent from the ostium to the PDA. The case was terminated.   CONTRAST:  Total of 175 cc.  COMPLICATIONS:  Inability to selectively engage the right coronary   HEMODYNAMICS:  Aortic pressure 76/47 mmHg; LV pressure 86 / 2 mmHg; LVEDP 5 mmHg  ANGIOGRAPHIC DATA:   The left main coronary artery is  normal.  The left anterior descending artery is widely patent. Proximal and mid luminal irregularities are noted. The LAD is trans-apical..  The left circumflex artery is normal. Head is a system that essentially gives one obtuse marginal that bifurcates and supplies the lateral and apical walls.  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 The ostium proximal mid and distal vessel is widely patent. The PDA and left ventricular branches are patent.Marland Kitchen.  LEFT VENTRICULOGRAM:  Left ventricular angiogram was done in the 30 RAO projection and revealed a normal LV cavity size. There is very mild anterior and inferior hypokinesis. Estimated ejection fraction is 40-50%.   IMPRESSIONS:  1. Widely patent coronary arteries 2. Low normal to mildly depressed LV systolic function with EF estimated to be 45% 3. 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 Volume contraction denoted by left ventricular end-diastolic pressure of 5 mmHg with systemic hypotension responding to normal saline bolus infusion in laboratory. 5. Right heart catheterization was not performed because we will unable to enter the right femoral vein presumably related to volume contraction and vessel collapse.   RECOMMENDATION:  1. IV fluid administration to treat volume contraction. The plan is to give 400 cc of normal saline and then discontinue. 2..further management per treatment team

## 2014-05-19 NOTE — Progress Notes (Signed)
Patient back from cath lab without any complications.  Does not appear to be in any distress.  Was placed back on 28% trach collar.  Vitals are stable.  RT will continue to monitor.

## 2014-05-19 NOTE — Progress Notes (Signed)
Site area: RFA Site Prior to Removal:  Level 0 Pressure Applied For:20 min Manual:   yes Patient Status During Pull:  stable Post Pull Site:  Level 0 Post Pull Instructions Given: yes  Post Pull Pulses Present: palpable Dressing Applied:  clear Bedrest begins @ 1715 Comments: by Lorel MonacoLaura M

## 2014-05-19 NOTE — Evaluation (Signed)
Occupational Therapy Evaluation Patient Details Name: [name] DistanceLenora Bosque MRN: [id number] DOB: 22-May-1966 Today's Date: 05/19/2014    History of Present Illness In brief, 48 y/o female with PMH of type 1 DM, HTN, s/p trach/PEG following substance abuse, hypothyroidism who p/w AMS. Pt was discharged from SNF and lives with her mother. Per mother she was refusing insulin and was altered several hours prior to coming to ED with DKA, NSTEMI    Clinical Impression   Pt admitted with above. She demonstrates the below listed deficits and will benefit from continued OT to maximize safety and independence with BADLs.  Pt presents to OT with generalized weakness, and impaired balance.  Currently, she requires min A for BADLs.  She will need 24 hour assist initially at discharge - pt confirms she does have 24 hour assist.       Follow Up Recommendations  No OT follow up;Supervision/Assistance - 24 hour    Equipment Recommendations  None recommended by OT    Recommendations for Other Services       Precautions / Restrictions Precautions Precautions: Fall Precaution Comments: trach, peg      Mobility Bed Mobility Overal bed mobility: Needs Assistance Bed Mobility: Supine to Sit;Sit to Supine Rolling: Supervision   Supine to sit: Min assist Sit to supine: Min guard   General bed mobility comments: requires assist to lift shoulders.  Requires encouragement to scoot self up in the bed   Transfers Overall transfer level: Needs assistance Equipment used: 1 person hand held assist Transfers: Sit to/from Stand;Stand Pivot Transfers Sit to Stand: Min assist Stand pivot transfers: Min assist       General transfer comment: min A for balance    Balance Overall balance assessment: Needs assistance Sitting-balance support: Feet supported Sitting balance-Leahy Scale: Good     Standing balance support: No upper extremity supported Standing balance-Leahy Scale: Fair                               ADL Overall ADL's : Needs assistance/impaired Eating/Feeding: NPO   Grooming: Wash/dry hands;Wash/dry face;Oral care;Brushing hair;Minimal assistance;Standing   Upper Body Bathing: Set up;Supervision/ safety;Sitting   Lower Body Bathing: Minimal assistance;Sit to/from stand   Upper Body Dressing : Supervision/safety;Set up;Sitting   Lower Body Dressing: Minimal assistance;Sit to/from stand   Toilet Transfer: Minimal assistance;Ambulation;Comfort height toilet   Toileting- Clothing Manipulation and Hygiene: Minimal assistance;Sit to/from stand       Functional mobility during ADLs: Minimal assistance General ADL Comments: Pt requires encouragement to do as much for self as possible.      Vision                     Perception Perception Perception Tested?: Yes   Praxis Praxis Praxis tested?: Within functional limits    Pertinent Vitals/Pain Pain Assessment: No/denies pain     Hand Dominance Right   Extremity/Trunk Assessment Upper Extremity Assessment Upper Extremity Assessment: Generalized weakness   Lower Extremity Assessment Lower Extremity Assessment: Defer to PT evaluation   Cervical / Trunk Assessment Cervical / Trunk Assessment: Normal   Communication Communication Communication: Tracheostomy   Cognition Arousal/Alertness: Awake/alert Behavior During Therapy: WFL for tasks assessed/performed Overall Cognitive Status: No family/caregiver present to determine baseline cognitive functioning                     General Comments       Exercises  Shoulder Instructions      Home Living Family/patient expects to be discharged to:: Private residence Living Arrangements: Parent;Spouse/significant other Available Help at Discharge: Family;Available 24 hours/day Type of Home: Apartment Home Access: Stairs to enter Entrance Stairs-Number of Steps: 2   Home Layout: One level     Bathroom Shower/Tub:  (Pt sponge  bathes)   Bathroom Toilet: Standard     Home Equipment: Walker - 2 wheels          Prior Functioning/Environment Level of Independence: Independent        Comments: Pt reports she was independent with BADLs and assisted with IADLs.  She does not drive. Sponge bathes due to trach     OT Diagnosis: Generalized weakness   OT Problem List: Decreased strength;Decreased activity tolerance;Impaired balance (sitting and/or standing);Decreased knowledge of use of DME or AE   OT Treatment/Interventions: Self-care/ADL training;Therapeutic exercise;DME and/or AE instruction;Therapeutic activities;Patient/family education;Balance training    OT Goals(Current goals can be found in the care plan section) Acute Rehab OT Goals Patient Stated Goal: be able to go home OT Goal Formulation: With patient Time For Goal Achievement: 06/02/14 Potential to Achieve Goals: Good  OT Frequency: Min 2X/week   Barriers to D/C:            Co-evaluation              End of Session Nurse Communication: Mobility status  Activity Tolerance: Patient tolerated treatment well Patient left: in bed;with call bell/phone within reach   Time: 1356-1418 OT Time Calculation (min): 22 min Charges:  OT General Charges $OT Visit: 1 Procedure OT Evaluation $Initial OT Evaluation Tier I: 1 Procedure OT Treatments $Therapeutic Activity: 8-22 mins G-Codes:    Maleiya Pergola M 05/19/2014, 3:23 PM

## 2014-05-19 NOTE — Progress Notes (Signed)
Subjective: Overnight patient reporting epigastric abdominal pain with onset of several hours ago. Patient states that she had non-bilious, non-bloody vomiting once this morning and also had a bowel movement this morning as well. EKG showed T wave inversions. She is due for a Cath today for an which was previously diagnosed NSTEMI.  Objective: Vital signs in last 24 hours: Filed Vitals:   05/19/14 0920 05/19/14 0946 05/19/14 1000 05/19/14 1100  BP: 125/71 113/78 105/71 121/69  Pulse:  94 95 86  Temp:      TempSrc:      Resp: 19  20 19   Height:      Weight:      SpO2:   100% 100%   Weight change:   Intake/Output Summary (Last 24 hours) at 05/19/14 1321 Last data filed at 05/19/14 1200  Gross per 24 hour  Intake 601.73 ml  Output   1325 ml  Net -723.27 ml   Physical Exam: General: Awake, alert, oriented, answering questions appropriately.  HEENT:  Moist mucus membranes. Trach w/ O2 via collar.  Lungs: Scattered coarse breath sounds, otherwise clear to auscultation. No wheezing.  Heart: Regular rate and rhythm. no murmurs, gallops, or rubs Abdomen: Soft, non-tender, non-distended, BS +. Left-sided PEG tube in place. Clean, dry, intact.  Extremities: No cyanosis, clubbing, or edema   Lab Results: Basic Metabolic Panel:  Recent Labs Lab 05/17/14 0003  05/19/14 0352 05/19/14 0946  NA 145  < > 136* 136*  K 3.9  < > 3.7 3.5*  CL 111  < > 98 98  CO2 20  < > 24 26  GLUCOSE 126*  < > 255* 195*  BUN 34*  < > 16 18  CREATININE 0.97  < > 0.68 0.62  CALCIUM 7.9*  < > 7.5* 7.4*  MG 1.9  --   --   --   < > = values in this interval not displayed. Liver Function Tests:  Recent Labs Lab 05/15/14 1725  AST 24  ALT 12  ALKPHOS 101  BILITOT 0.2*  PROT 8.5*  ALBUMIN 4.4   CBC:  Recent Labs Lab 05/15/14 1753  05/18/14 0320 05/19/14 0352  WBC 28.0*  < > 9.2 5.2  NEUTROABS 22.9*  --   --   --   HGB 10.3*  < > 10.6* 11.2*  HCT 34.1*  < > 31.9* 34.6*  MCV 65.5*  < >  59.8* 60.7*  PLT 382  < > 196 180  < > = values in this interval not displayed. Cardiac Enzymes:  Recent Labs Lab 05/16/14 1815 05/17/14 0003 05/17/14 0826  TROPONINI 2.68* 1.24* 0.53*   CBG:  Recent Labs Lab 05/18/14 1354 05/18/14 1539 05/18/14 1938 05/18/14 2312 05/19/14 0349 05/19/14 0729  GLUCAP 89 96 164* 242* 240* 190*    Thyroid Function Tests:  Recent Labs Lab 05/16/14 0545  TSH 1.700   Anemia Panel:  Recent Labs Lab 05/16/14 1015  VITAMINB12 >2000*  FOLATE >20.0  FERRITIN 199  TIBC 238*  IRON 20*  RETICCTPCT 0.9   Urine Drug Screen: Drugs of Abuse     Component Value Date/Time   LABOPIA NONE DETECTED 05/15/2014 2100   COCAINSCRNUR NONE DETECTED 05/15/2014 2100   LABBENZ NONE DETECTED 05/15/2014 2100   AMPHETMU NONE DETECTED 05/15/2014 2100   THCU NONE DETECTED 05/15/2014 2100   LABBARB NONE DETECTED 05/15/2014 2100    Urinalysis:  Recent Labs Lab 05/15/14 2100  COLORURINE YELLOW  LABSPEC 1.022  PHURINE 5.5  GLUCOSEU >  1000*  HGBUR SMALL*  BILIRUBINUR NEGATIVE  KETONESUR >80*  PROTEINUR NEGATIVE  UROBILINOGEN 0.2  NITRITE NEGATIVE  LEUKOCYTESUR NEGATIVE    Micro Results: Recent Results (from the past 240 hour(s))  Blood culture (routine x 2)     Status: None (Preliminary result)   Collection Time: 05/15/14  5:53 PM  Result Value Ref Range Status   Specimen Description BLOOD ARM RIGHT  Final   Special Requests BOTTLES DRAWN AEROBIC AND ANAEROBIC 10CC  Final   Culture  Setup Time   Final    05/16/2014 01:20 Performed at [facility]    Culture   Final           BLOOD CULTURE RECEIVED NO GROWTH TO DATE CULTURE WILL BE HELD FOR 5 DAYS BEFORE ISSUING A FINAL NEGATIVE REPORT Performed at [facility]    Report Status PENDING  Incomplete  Blood culture (routine x 2)     Status: None (Preliminary result)   Collection Time: 05/15/14  6:09 PM  Result Value Ref Range Status   Specimen Description BLOOD FOREARM  LEFT  Final   Special Requests BOTTLES DRAWN AEROBIC AND ANAEROBIC 5CC  Final   Culture  Setup Time   Final    05/16/2014 01:22 Performed at [facility]    Culture   Final           BLOOD CULTURE RECEIVED NO GROWTH TO DATE CULTURE WILL BE HELD FOR 5 DAYS BEFORE ISSUING A FINAL NEGATIVE REPORT Performed at [facility]    Report Status PENDING  Incomplete  MRSA PCR Screening     Status: Abnormal   Collection Time: 05/16/14  1:36 AM  Result Value Ref Range Status   MRSA by PCR POSITIVE (A) NEGATIVE Final    Comment:        The GeneXpert MRSA Assay (FDA approved for NASAL specimens only), is one component of a comprehensive MRSA colonization surveillance program. It is not intended to diagnose MRSA infection nor to guide or monitor treatment for MRSA infections. RESULT CALLED TO, READ BACK BY AND VERIFIED WITH: CALLED TO RN Devona KonigAVERY DANIEL [phone] @0445  THANEY  Culture, respiratory (NON-Expectorated)     Status: None   Collection Time: 05/16/14  8:35 AM  Result Value Ref Range Status   Specimen Description TRACHEAL ASPIRATE  Final   Special Requests NONE  Final   Gram Stain   Final    FEW WBC PRESENT,BOTH PMN AND MONONUCLEAR RARE SQUAMOUS EPITHELIAL CELLS PRESENT MODERATE GRAM POSITIVE COCCI IN PAIRS IN CLUSTERS FEW GRAM POSITIVE RODS Performed at [facility]    Culture   Final    ABUNDANT METHICILLIN RESISTANT STAPHYLOCOCCUS AUREUS Note: RIFAMPIN AND GENTAMICIN SHOULD NOT BE USED AS SINGLE DRUGS FOR TREATMENT OF STAPH INFECTIONS. This organism is presumed to be Clindamycin resistant based on detection of inducible Clindamycin resistance. CRITICAL RESULT CALLED TO, READ BACK BY AND  VERIFIED WITH: [name] @950am  11.3.15 BY OANA Performed at [facility]    Report Status 05/19/2014 FINAL  Final   Organism ID, Bacteria METHICILLIN RESISTANT STAPHYLOCOCCUS AUREUS  Final      Susceptibility   Methicillin resistant staphylococcus aureus - MIC*     CLINDAMYCIN RESISTANT      ERYTHROMYCIN >=8 RESISTANT Resistant     GENTAMICIN <=0.5 SENSITIVE Sensitive     LEVOFLOXACIN >=8 RESISTANT Resistant     OXACILLIN >=4 RESISTANT Resistant     RIFAMPIN <=0.5 SENSITIVE Sensitive     TRIMETH/SULFA <=10  SENSITIVE Sensitive     VANCOMYCIN 1 SENSITIVE Sensitive     TETRACYCLINE <=1 SENSITIVE Sensitive     PENICILLIN >=0.5 RESISTANT Resistant     * ABUNDANT METHICILLIN RESISTANT STAPHYLOCOCCUS AUREUS   Studies/Results: Dg Abd Portable 1v  05/19/2014   CLINICAL DATA:  Pain upper mid abdomen.  EXAM: PORTABLE ABDOMEN - 1 VIEW  COMPARISON:  05/16/2014.  FINDINGS: Left upper quadrant gastrostomy tube again demonstrated. Gas and stool in the colon. No small or large bowel distention. No radiopaque stones. Visualized bones appear intact.  IMPRESSION: Nonobstructive bowel gas pattern.   Electronically Signed   By: Burman Nieves M.D.   On: 05/19/2014 01:28    Medications: I have reviewed the patient's current medications. Scheduled Meds: . antiseptic oral rinse  7 mL Mouth Rinse q12n4p  . aspirin EC  81 mg Oral Daily  . atorvastatin  80 mg Oral q1800  . chlorhexidine  15 mL Mouth Rinse BID  . Chlorhexidine Gluconate Cloth  6 each Topical Q0600  . insulin aspart  0-9 Units Subcutaneous 6 times per day  . metoprolol tartrate  12.5 mg Oral BID   Or  . metoprolol tartrate  12.5 mg Oral BID  . mupirocin ointment  1 application Nasal BID  . sodium chloride  10-40 mL Intracatheter Q12H  . sodium chloride  3 mL Intravenous Q12H   Continuous Infusions: . dextrose 5 % and 0.9% NaCl 10 mL/hr at 05/18/14 1100  . feeding supplement (GLUCERNA 1.2 CAL)    . heparin 850 Units/hr (05/19/14 0451)   PRN Meds:.sodium chloride, acetaminophen (TYLENOL) oral liquid 160 mg/5 mL, dextrose, ipratropium-albuterol, sodium chloride, sodium chloride, sodium chloride, sodium chloride  Assessment/Plan: 48 y/o F w/ PMHx of DM type I, microcytic anemia, HLD, CAD, HTN, trach  and PEG dependent, w/ h/o polysubstance abuse, admitted for severe DKA.   DKA: Significantly improved. AG closed, acidosis resolved. Difficult situation w/ respect to CBG's and fluid status. Will remain NPO prior to cath. -Discontinue insulin gtt; start on Lantus 7 units for now. Will closely follow CBG's to assess insulin need.   -PICC in place -Keep NPO for now prior to cardiac cath -Continue BMP q4h for now -K supplementation as necessary per tube; with PICC in place.   Anion Gap Metabolic Acidosis w/ Respiratory Compensation: Resolved as above.   -Insulin as above -Continue to monitor via BMP q4h  NSTEMI: Patient w/ troponin elevation, peaking at 2.68. Downtrending 0.53 on 11/1. The abdominal pain and T-Wave inversions suggests recurrent ischemia, but the patient has a previously planned cardiac catheterization today.   On Heparin gtt. CXR w/ mild volume overload. ECHO demonstrates reduced EF of 25%-30% -Keep NPO for now -Continue Heparin gtt -Cath today -Continue Lipitor, ASA, Lopressor 12.5 mg bid  Leukocytosis: Resolved. Tracheal aspirate shows MRSA, likely only mild tracheitis on admission. Trach changed, CXR on 11/1 suggestive of CHF without new opacities. -Blood cultures negative x 4 days -Repeat CBC in AM  AKI: Resolved.  -IVF as above -BMP q2h  Hypotension: Resolved. Now w/ mild volume overload. -Lasix as above -Continue to monitor  Microcytic anemia: Stable. Hb 10.6 most recently. Patient on ferrous sulfate 325 mg daily at home. Repeat Iron studies show low T Sat (8%), low TIBC (238), and low Iron (20). 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  -CBC in AM  -Restart iron when tolerating tube feeds after cath  Mental disorder: Stable. Mental status significantly improved.  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  -Restart Methadone 5 mg bid per tube if showing signs of withdrawal.   DTV PPx: Heparin gtt  Dispo: Disposition is deferred at this time, awaiting improvement of current medical problems.  Anticipated discharge in approximately 2-3 day(s).   The patient does have a current PCP (Rich Numberarly Rivet, MD) and [facility].  The patient does not have transportation limitations that hinder transportation to clinic appointments.  .Services Needed at time of discharge: Y = Yes, Blank = No PT:   OT:   RN:   Equipment:   Other:     LOS: 4 days   Karl PockJeremy N Supreme Rybarczyk, Med Student 05/19/2014, 1:21 PM

## 2014-05-19 NOTE — Progress Notes (Addendum)
Patient ID: [name], female   DOB: 03-Dec-1965, 48 y.o.   MRN: [id number]  Cardiology Progress note  Patient Name: [name] Date of Encounter: 05/19/2014     Principal Problem:   DKA (diabetic ketoacidosis) Active Problems:   Diabetes mellitus type 1, uncontrolled, insulin dependent   HLD (hyperlipidemia)   Microcytic anemia   Substance abuse   Mental disorder   Hypotension   Metabolic acidosis with respiratory alkalosis   Acute encephalopathy   EKG abnormalities   NSTEMI (non-ST elevated myocardial infarction)   Diabetic ketoacidosis without coma associated with type 1 diabetes mellitus    SUBJECTIVE No events overnight. Breathing has improved somewhat. Net negative 1.3L yesterday. Still +2L positive. Echo shows newly reduced EF to 25-30%.  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   CURRENT MEDS . antiseptic oral rinse  7 mL Mouth Rinse q12n4p  . aspirin EC  81 mg Oral Daily  . atorvastatin  80 mg Oral q1800  . chlorhexidine  15 mL Mouth Rinse BID  . Chlorhexidine Gluconate Cloth  6 each Topical Q0600  . insulin aspart  0-9 Units Subcutaneous 6 times per day  . metoprolol tartrate  12.5 mg Oral BID   Or  . metoprolol tartrate  12.5 mg Oral BID  . mupirocin ointment  1 application Nasal BID  . sodium chloride  10-40 mL Intracatheter Q12H  . sodium chloride  3 mL Intravenous Q12H    OBJECTIVE  Filed Vitals:   05/19/14 0800 05/19/14 0900 05/19/14 0920 05/19/14 0946  BP: 105/61 125/91 125/71 113/78  Pulse: 97 92  94  Temp:      TempSrc:      Resp: 20 19 19    Height:      Weight:      SpO2: 100% 100%      Intake/Output Summary (Last 24 hours) at 05/19/14 1032 Last data filed at 05/19/14 0900  Gross per 24 hour  Intake 588.23 ml  Output   2080 ml  Net -1491.77 ml   Filed Weights   05/16/14 1500 05/19/14 0500  Weight: 113 lb 12.1 oz (51.6 kg) 110 lb 10.7 oz (50.2 kg)    PHYSICAL EXAM  General: Pleasant, trach tube in place, NAD. Neuro: Alert and  oriented X 3. Moves all extremities spontaneously. Psych: Normal affect. HEENT:  Normal  Neck: Supple without bruits or JVD. Trach tube in place Lungs:  Resp regular and unlabored, CTA. Heart: RRR no s3, s4, or murmurs. Abdomen: Soft, non-tender, non-distended, BS + x 4.  Extremities: No clubbing, cyanosis or edema. DP/PT/Radials 2+ and equal bilaterally.  Accessory Clinical Findings  CBC  Recent Labs  05/18/14 0320 05/19/14 0352  WBC 9.2 5.2  HGB 10.6* 11.2*  HCT 31.9* 34.6*  MCV 59.8* 60.7*  PLT 196 180   Basic Metabolic Panel  Recent Labs  05/17/14 0003  05/18/14 1940 05/19/14 0352  NA 145  < > 136* 136*  K 3.9  < > 3.3* 3.7  CL 111  < > 97 98  CO2 20  < > 26 24  GLUCOSE 126*  < > 171* 255*  BUN 34*  < > 11 16  CREATININE 0.97  < > 0.67 0.68  CALCIUM 7.9*  < > 7.3* 7.5*  MG 1.9  --   --   --   < > = values in this interval not displayed. Liver Function Tests No results for input(s): AST, ALT, ALKPHOS, BILITOT, PROT, ALBUMIN in the last 72 hours. No results  for input(s): LIPASE, AMYLASE in the last 72 hours. Cardiac Enzymes  Recent Labs  05/16/14 1815 05/17/14 0003 05/17/14 0826  TROPONINI 2.68* 1.24* 0.53*   BNP Invalid input(s): POCBNP D-Dimer No results for input(s): DDIMER in the last 72 hours. Hemoglobin A1C No results for input(s): HGBA1C in the last 72 hours. Fasting Lipid Panel No results for input(s): CHOL, HDL, LDLCALC, TRIG, CHOLHDL, LDLDIRECT in the last 72 hours. Thyroid Function Tests No results for input(s): TSH, T4TOTAL, T3FREE, THYROIDAB in the last 72 hours.  Invalid input(s): FREET3  TELE  Nsr/sinus tachy  Radiology/Studies  Ct Head Wo Contrast  05/15/2014   CLINICAL DATA:  Altered mental status.  EXAM: CT HEAD WITHOUT CONTRAST  TECHNIQUE: Contiguous axial images were obtained from the base of the skull through the vertex without intravenous contrast.  COMPARISON:  None.  FINDINGS: No intra-axial or extra-axial pathologic  fluid or blood collection. No mass lesion. No hydrocephalus. No hemorrhage. No acute bony abnormality  IMPRESSION: No acute abnormality.   Electronically Signed   By: Maisie Fus  Register   On: 05/15/2014 19:30   Dg Chest Port 1 View  05/17/2014   CLINICAL DATA:  Diabetic ketoacidosis  EXAM: PORTABLE CHEST - 1 VIEW  COMPARISON:  May 17, 2014 study obtained earlier in the day  FINDINGS: Tracheostomy catheter tip is 2.6 cm above the carina. No pneumothorax. There is persistent interstitial edema with alveolar edema in the left parahilar region, stable. No new opacity. No change in cardiac silhouette. No adenopathy.  IMPRESSION: Findings felt to represent a degree of congestive heart failure. No new opacity. No change in cardiac silhouette.   Electronically Signed   By: Bretta Bang M.D.   On: 05/17/2014 11:20   Dg Chest Port 1 View  05/17/2014   CLINICAL DATA:  Productive cough ; hypertension  EXAM: PORTABLE CHEST - 1 VIEW  COMPARISON:  May 15, 2014  FINDINGS: Tracheostomy catheter tip is 3.3 cm above the carina. No pneumothorax. There is generalized interstitial edema with patchy alveolar edema in the left perihilar region. Lungs elsewhere clear. There is cardiomegaly, mild, with pulmonary venous hypertension. No adenopathy. No bone lesions.  IMPRESSION: Evidence of congestive heart failure. Tracheostomy as described without pneumothorax.   Electronically Signed   By: Bretta Bang M.D.   On: 05/17/2014 11:07   Dg Chest Port 1 View  05/15/2014   CLINICAL DATA:  Altered mental status for 1 day. Patient has a mental illness. Unresponsive today.  EXAM: PORTABLE CHEST - 1 VIEW  COMPARISON:  01/18/2013  FINDINGS: Tracheostomy tube in place. Tip is 2.1 cm from the carina. Upper normal heart size. Clear lungs.  IMPRESSION: Tracheostomy tube in place.  No active cardiopulmonary disease.   Electronically Signed   By: Maryclare Bean M.D.   On: 05/15/2014 18:34   Dg Abd Portable 1v  05/19/2014   CLINICAL  DATA:  Pain upper mid abdomen.  EXAM: PORTABLE ABDOMEN - 1 VIEW  COMPARISON:  05/16/2014.  FINDINGS: Left upper quadrant gastrostomy tube again demonstrated. Gas and stool in the colon. No small or large bowel distention. No radiopaque stones. Visualized bones appear intact.  IMPRESSION: Nonobstructive bowel gas pattern.   Electronically Signed   By: Burman Nieves M.D.   On: 05/19/2014 01:28   Dg Abd Portable 1v  05/16/2014   CLINICAL DATA:  PEG placement  EXAM: PORTABLE ABDOMEN - 1 VIEW  COMPARISON:  08/22/2012  FINDINGS: Scattered large and small bowel gas is noted. A gastrostomy catheter is  noted within the gastric lumen. No other focal abnormality is noted.  IMPRESSION: Gastrostomy catheter in place.   Electronically Signed   By: Alcide CleverMark  Lukens M.D.   On: 05/16/2014 21:15   Echo: Study Conclusions  - Left ventricle: The cavity size was normal. Wall thickness was normal. Systolic function was severely reduced. The estimated ejection fraction was in the range of 25% to 30%. Severe hypokinesis of the entireinferolateral, inferior, and inferoseptal myocardium. Hypokinesis of the mid-apicalanteroseptal, anterior, and apical myocardium. Due to tachycardia, there was fusion of early and atrial contributions to ventricular filling. Doppler parameters are consistent with elevated mean left atrial filling pressure. Acoustic contrast opacification revealed no evidence ofthrombus. - Mitral valve: There was mild to moderate regurgitation directed centrally and posteriorly. - Right ventricle: Systolic function was mildly reduced.  Impressions:  - Compared to 2006, LV function is substantially worse.  ASSESSMENT AND PLAN  1. NSTEMI - she will need L/RHC. Echo shows newly reduced EF and multiple wall motion abnormalities. Plan for cath this afternoon. 2. Acute systolic congestive heart failure - Echo yesterday shows newly reduced EF of 25-30% with regional wall motion  abnormalities. 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  Diuresed yesterday, breathing has improved. IVF's stopped.  Give another lasix dose today. 3. DKA - as per primary team, but appears to be resolving. Gap is resolved. Starting long-acting insulin 4. Hypokalemia - repleted.  Chrystie NoseKenneth C. Hilty, MD, [facility] HospitalFACC Attending Cardiologist [facility]   05/19/2014 10:32 AM

## 2014-05-19 NOTE — Progress Notes (Signed)
Physical Therapy Treatment Patient Details Name: [name] DistanceLenora [name] MRN: [id number] DOB: [birth year] Today's Date: 05/19/2014    History of Present Illness In brief, 48 y/o female with PMH of type 1 DM, HTN, s/p trach/PEG following substance abuse, hypothyroidism who p/w AMS. Pt was discharged from SNF and lives with her mother. Per mother she was refusing insulin and was altered several hours prior to coming to ED with DKA, NSTEMI     PT Comments    Progressing well towards physical therapy goals, ambulating up to 100 feet today with min assist. Tolerating therapeutic exercises well and motivated to work with therapy. Patient will continue to benefit from skilled physical therapy services to further improve independence with functional mobility.   Follow Up Recommendations  Home health PT;Supervision for mobility/OOB     Equipment Recommendations  None recommended by PT    Recommendations for Other Services       Precautions / Restrictions Precautions Precaution Comments: trach, peg Restrictions Weight Bearing Restrictions: No    Mobility  Bed Mobility Overal bed mobility: Needs Assistance Bed Mobility: Rolling;Sidelying to Sit Rolling: Supervision Sidelying to sit: Supervision       General bed mobility comments: Supervision for safety, VC for technique. Slow to rise but did not require physical assist.  Transfers Overall transfer level: Needs assistance Equipment used: None Transfers: Sit to/from Stand Sit to Stand: Min assist         General transfer comment: Min assist for stability to rise from lowest bed setting. Uses posterior knees on bed for balance initially.  Ambulation/Gait Ambulation/Gait assistance: Min assist Ambulation Distance (Feet): 100 Feet Assistive device: None Gait Pattern/deviations: Step-through pattern;Decreased stride length   Gait velocity interpretation: Below normal speed for age/gender General Gait Details: Min assist for stability.  VC for forward gaze. No loss of balance during bout.   Stairs            Wheelchair Mobility    Modified Rankin (Stroke Patients Only)       Balance                                    Cognition Arousal/Alertness: Awake/alert Behavior During Therapy: WFL for tasks assessed/performed Overall Cognitive Status: Within Functional Limits for tasks assessed                      Exercises General Exercises - Upper Extremity Shoulder Flexion: AROM;Strengthening;Both;10 reps;Seated Elbow Flexion: Both;AROM;10 reps;Seated Elbow Extension: AROM;Strengthening;Both;10 reps;Seated (manual resistance) General Exercises - Lower Extremity Ankle Circles/Pumps: AROM;Both;10 reps;Supine Long Arc Quad: Strengthening;Both;10 reps;Seated Heel Slides: AAROM;Both;10 reps;Seated;Strengthening Hip ABduction/ADduction: AAROM;Strengthening;Both;10 reps;Seated Straight Leg Raises: Strengthening;Both;10 reps;Seated Hip Flexion/Marching: Strengthening;Both;10 reps;Seated    General Comments        Pertinent Vitals/Pain Pain Assessment: No/denies pain  SpO2 maintained 100% throughout therapy session while on room air Pre: BP - 119/71 HR - 102 Post: BP - 113/78 HR - 116    Home Living                      Prior Function            PT Goals (current goals can now be found in Nicole care plan section) Acute Rehab PT Goals PT Goal Formulation: With patient Time For Goal Achievement: 06/01/14 Potential to Achieve Goals: Good Progress towards PT goals: Progressing toward goals    Frequency  Min 3X/week  PT Plan Current plan remains appropriate    Co-evaluation             End of Session Equipment Utilized During Treatment: Gait belt Activity Tolerance: Patient tolerated treatment well Patient left: in chair;with call bell/phone within reach     Time: 0826-0854 PT Time Calculation (min): 28 min  Charges:  $Gait Training: 8-22  mins $Therapeutic Exercise: 8-22 mins                    G Codes:      Berton MountBarbour, Taeden Geller S 05/19/2014, 9:16 AM   Charlsie MerlesLogan Secor Baylyn Sickles, PT [phone]

## 2014-05-19 NOTE — Progress Notes (Addendum)
Subjective: Pt had some abdominal pain overnight that was assessed overnight. Imaging was negative. Pt has had completely resolved abdominal discomfort but pt is hungry this AM. Pt ambulated with PT.   Objective: Vital signs in last 24 hours: Filed Vitals:   05/19/14 0920 05/19/14 0946 05/19/14 1000 05/19/14 1100  BP: 125/71 113/78 105/71 121/69  Pulse:  94 95 86  Temp:      TempSrc:      Resp: 19  20 19   Height:      Weight:      SpO2:   100% 100%   Weight change:   Intake/Output Summary (Last 24 hours) at 05/19/14 1216 Last data filed at 05/19/14 1200  Gross per 24 hour  Intake 618.73 ml  Output   1725 ml  Net -1106.27 ml   General: resting in bed, NAD HEENT: PERRL, EOMI, no scleral icterus, trach in place Cardiac: RRR, no rubs, murmurs or gallops Pulm: clear to auscultation bilaterally, moving normal volumes of air Abd: soft, nontender, nondistended, BS present, PEG site w/o erythema or ttp Ext: warm and well perfused, no pedal edema Neuro: alert and oriented X3, cranial nerves II-XII grossly intact  Lab Results: Basic Metabolic Panel:  Recent Labs Lab 05/17/14 0003  05/19/14 0352 05/19/14 0946  NA 145  < > 136* 136*  K 3.9  < > 3.7 3.5*  CL 111  < > 98 98  CO2 20  < > 24 26  GLUCOSE 126*  < > 255* 195*  BUN 34*  < > 16 18  CREATININE 0.97  < > 0.68 0.62  CALCIUM 7.9*  < > 7.5* 7.4*  MG 1.9  --   --   --   < > = values in this interval not displayed. Liver Function Tests:  Recent Labs Lab 05/15/14 1725  AST 24  ALT 12  ALKPHOS 101  BILITOT 0.2*  PROT 8.5*  ALBUMIN 4.4    CBC:  Recent Labs Lab 05/15/14 1753  05/18/14 0320 05/19/14 0352  WBC 28.0*  < > 9.2 5.2  NEUTROABS 22.9*  --   --   --   HGB 10.3*  < > 10.6* 11.2*  HCT 34.1*  < > 31.9* 34.6*  MCV 65.5*  < > 59.8* 60.7*  PLT 382  < > 196 180  < > = values in this interval not displayed. Cardiac Enzymes:  Recent Labs Lab 05/16/14 1815 05/17/14 0003 05/17/14 0826  TROPONINI  2.68* 1.24* 0.53*   BNP:  Recent Labs Lab 05/17/14 0003  PROBNP 3608.0*   CBG:  Recent Labs Lab 05/18/14 1354 05/18/14 1539 05/18/14 1938 05/18/14 2312 05/19/14 0349 05/19/14 0729  GLUCAP 89 96 164* 242* 240* 190*   Hemoglobin A1C:  Recent Labs Lab 05/16/14 1015  HGBA1C 9.5*   Thyroid Function Tests:  Recent Labs Lab 05/16/14 0545  TSH 1.700   Anemia Panel:  Recent Labs Lab 05/16/14 1015  VITAMINB12 >2000*  FOLATE >20.0  FERRITIN 199  TIBC 238*  IRON 20*  RETICCTPCT 0.9   Urine Drug Screen: Drugs of Abuse     Component Value Date/Time   LABOPIA NONE DETECTED 05/15/2014 2100   COCAINSCRNUR NONE DETECTED 05/15/2014 2100   LABBENZ NONE DETECTED 05/15/2014 2100   AMPHETMU NONE DETECTED 05/15/2014 2100   THCU NONE DETECTED 05/15/2014 2100   LABBARB NONE DETECTED 05/15/2014 2100    Urinalysis:  Recent Labs Lab 05/15/14 2100  COLORURINE YELLOW  LABSPEC 1.022  PHURINE 5.5  GLUCOSEU >  1000*  HGBUR SMALL*  BILIRUBINUR NEGATIVE  KETONESUR >80*  PROTEINUR NEGATIVE  UROBILINOGEN 0.2  NITRITE NEGATIVE  LEUKOCYTESUR NEGATIVE   Micro Results: Recent Results (from the past 240 hour(s))  Blood culture (routine x 2)     Status: None (Preliminary result)   Collection Time: 05/15/14  5:53 PM  Result Value Ref Range Status   Specimen Description BLOOD ARM RIGHT  Final   Special Requests BOTTLES DRAWN AEROBIC AND ANAEROBIC 10CC  Final   Culture  Setup Time   Final    05/16/2014 01:20 Performed at [facility]    Culture   Final           BLOOD CULTURE RECEIVED NO GROWTH TO DATE CULTURE WILL BE HELD FOR 5 DAYS BEFORE ISSUING A FINAL NEGATIVE REPORT Performed at [facility]    Report Status PENDING  Incomplete  Blood culture (routine x 2)     Status: None (Preliminary result)   Collection Time: 05/15/14  6:09 PM  Result Value Ref Range Status   Specimen Description BLOOD FOREARM LEFT  Final   Special Requests BOTTLES DRAWN AEROBIC  AND ANAEROBIC 5CC  Final   Culture  Setup Time   Final    05/16/2014 01:22 Performed at [facility]    Culture   Final           BLOOD CULTURE RECEIVED NO GROWTH TO DATE CULTURE WILL BE HELD FOR 5 DAYS BEFORE ISSUING A FINAL NEGATIVE REPORT Performed at [facility]    Report Status PENDING  Incomplete  MRSA PCR Screening     Status: Abnormal   Collection Time: 05/16/14  1:36 AM  Result Value Ref Range Status   MRSA by PCR POSITIVE (A) NEGATIVE Final    Comment:        The GeneXpert MRSA Assay (FDA approved for NASAL specimens only), is one component of a comprehensive MRSA colonization surveillance program. It is not intended to diagnose MRSA infection nor to guide or monitor treatment for MRSA infections. RESULT CALLED TO, READ BACK BY AND VERIFIED WITH: CALLED TO RN Devona Konig [phone] @0445  THANEY  Culture, respiratory (NON-Expectorated)     Status: None (Preliminary result)   Collection Time: 05/16/14  8:35 AM  Result Value Ref Range Status   Specimen Description TRACHEAL ASPIRATE  Final   Special Requests NONE  Final   Gram Stain PENDING  Incomplete   Culture   Final    ABUNDANT STAPHYLOCOCCUS AUREUS Note: RIFAMPIN AND GENTAMICIN SHOULD NOT BE USED AS SINGLE DRUGS FOR TREATMENT OF STAPH INFECTIONS. Performed at [facility]    Report Status PENDING  Incomplete   Studies/Results: Dg Abd Portable 1v  05/19/2014   CLINICAL DATA:  Pain upper mid abdomen.  EXAM: PORTABLE ABDOMEN - 1 VIEW  COMPARISON:  05/16/2014.  FINDINGS: Left upper quadrant gastrostomy tube again demonstrated. Gas and stool in the colon. No small or large bowel distention. No radiopaque stones. Visualized bones appear intact.  IMPRESSION: Nonobstructive bowel gas pattern.   Electronically Signed   By: Burman Nieves M.D.   On: 05/19/2014 01:28   Medications: I have reviewed the patient's current medications. Scheduled Meds: . antiseptic oral rinse  7 mL Mouth Rinse q12n4p    . aspirin EC  81 mg Oral Daily  . atorvastatin  80 mg Oral q1800  . chlorhexidine  15 mL Mouth Rinse BID  . Chlorhexidine Gluconate Cloth  6 each Topical Q0600  . furosemide  40 mg Intravenous NOW  . insulin aspart  0-9 Units Subcutaneous 6 times per day  . metoprolol tartrate  12.5 mg Oral BID   Or  . metoprolol tartrate  12.5 mg Oral BID  . mupirocin ointment  1 application Nasal BID  . sodium chloride  10-40 mL Intracatheter Q12H  . sodium chloride  3 mL Intravenous Q12H   Continuous Infusions: . dextrose 5 % and 0.9% NaCl 10 mL/hr at 05/18/14 1100  . feeding supplement (GLUCERNA 1.2 CAL)    . heparin 850 Units/hr (05/19/14 0451)   PRN Meds:.sodium chloride, acetaminophen (TYLENOL) oral liquid 160 mg/5 mL, dextrose, ipratropium-albuterol, sodium chloride, sodium chloride, sodium chloride, sodium chloride Assessment/Plan: # NSTEMI: Patient w/ troponin elevation, peaking at 2.68 on admission. On Heparin gtt, for cardiac catheterization in the AM. CXR w/ mild volume overload. Echo EF 25-30% with severe hypokinesis this is dramatically different from 2006 echo that had EF 55-65% and normal WM. This maybe etiology for acute DKA.  -Keep NPO for now -Continue Heparin gtt -ECHO pending today -Cath per cardiology in AM recs greatly appreciated  -Continue Lipitor, ASA, Lopressor 12.5 mg bid  # DKA resolved: AG closed, acidosis resolved. Difficult situation w/ respect to CBG's and fluid status. Unable to take po at this time given cath in the AM. Pt had PICC placed on 05/18/14 - start on Lantus 7 units qhs after procedure. Will closely follow CBG's to assess insulin need.  -SLP today, for formal evaluation tomorrow s/p cardiac catheterization -Continue BMP q4h for now -K supplementation as necessary per tube  # Microcytic anemia: Stable. Hb 10.6 most recently. Patient on ferrous sulfate 325 mg daily at home. Repeat Iron studies show low T Sat (8%), low TIBC (238), and low Iron (20). 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  -CBC in AM  -Restart iron when tolerating tube feeds.  # Mental disorder: Stable. Mental status significantly improved. Consider restarting her home meds after LHC.  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  -Restart Methadone 5 mg bid per tube if showing signs of withdrawal.   DTV PPx: Heparin gtt  Dispo: Disposition is deferred at this time, awaiting improvement of current medical problems.  Anticipated discharge in approximately 1-2 day(s).   The patient does have a current PCP (Rich Numberarly Rivet, MD) and [facility].  The patient does not have transportation limitations that hinder transportation to clinic appointments.  .Services Needed at time of discharge: Y = Yes, Blank = No PT:   OT:   RN:   Equipment:   Other:     LOS: 4 days   Christen BameNora Kyle Luppino, MD 05/19/2014, 12:16 PM

## 2014-05-20 DIAGNOSIS — I5021 Acute systolic (congestive) heart failure: Secondary | ICD-10-CM

## 2014-05-20 LAB — GLUCOSE, CAPILLARY
GLUCOSE-CAPILLARY: 259 mg/dL — AB (ref 70–99)
Glucose-Capillary: 273 mg/dL — ABNORMAL HIGH (ref 70–99)
Glucose-Capillary: 221 mg/dL — ABNORMAL HIGH (ref 70–99)
Glucose-Capillary: 155 mg/dL — ABNORMAL HIGH (ref 70–99)

## 2014-05-20 LAB — CBC
HCT: 29.4 % — ABNORMAL LOW (ref 36.0–46.0)
HEMOGLOBIN: 9.7 g/dL — AB (ref 12.0–15.0)
MCH: 20.2 pg — ABNORMAL LOW (ref 26.0–34.0)
MCHC: 33 g/dL (ref 30.0–36.0)
MCV: 61.1 fL — ABNORMAL LOW (ref 78.0–100.0)
PLATELETS: 169 10*3/uL (ref 150–400)
RBC: 4.81 MIL/uL (ref 3.87–5.11)
RDW: 17 % — AB (ref 11.5–15.5)
WBC: 7.4 10*3/uL (ref 4.0–10.5)

## 2014-05-20 LAB — BASIC METABOLIC PANEL
ANION GAP: 12 (ref 5–15)
BUN: 19 mg/dL (ref 6–23)
CO2: 26 mEq/L (ref 19–32)
Calcium: 7.8 mg/dL — ABNORMAL LOW (ref 8.4–10.5)
Chloride: 100 mEq/L (ref 96–112)
Creatinine, Ser: 0.69 mg/dL (ref 0.50–1.10)
Glucose, Bld: 179 mg/dL — ABNORMAL HIGH (ref 70–99)
POTASSIUM: 3.2 meq/L — AB (ref 3.7–5.3)
SODIUM: 138 meq/L (ref 137–147)

## 2014-05-20 MED ORDER — QUETIAPINE FUMARATE 50 MG PO TABS
50.0000 mg | ORAL_TABLET | Freq: Every day | ORAL | Status: DC
  Administered 2014-05-20 – 2014-05-22 (×3): 50 mg via ORAL
  Filled 2014-05-20 (×3): qty 1

## 2014-05-20 MED ORDER — FERROUS SULFATE 325 (65 FE) MG PO TABS
325.0000 mg | ORAL_TABLET | Freq: Two times a day (BID) | ORAL | Status: DC
Start: 2014-05-20 — End: 2014-05-22
  Administered 2014-05-20 – 2014-05-22 (×4): 325 mg via ORAL
  Filled 2014-05-20 (×6): qty 1

## 2014-05-20 MED ORDER — INSULIN GLARGINE 100 UNIT/ML ~~LOC~~ SOLN
7.0000 [IU] | Freq: Two times a day (BID) | SUBCUTANEOUS | Status: DC
  Administered 2014-05-20 (×2): 7 [IU] via SUBCUTANEOUS
  Filled 2014-05-20 (×3): qty 0.07

## 2014-05-20 MED ORDER — CLONAZEPAM 0.5 MG PO TABS: 0.5000 mg | ORAL_TABLET | Freq: Two times a day (BID) | ORAL | Status: DC | PRN

## 2014-05-20 MED ORDER — ENOXAPARIN SODIUM 40 MG/0.4ML ~~LOC~~ SOLN
40.0000 mg | SUBCUTANEOUS | Status: DC
  Administered 2014-05-20 – 2014-05-21 (×2): 40 mg via SUBCUTANEOUS
  Filled 2014-05-20 (×3): qty 0.4

## 2014-05-20 MED ORDER — POTASSIUM CHLORIDE 20 MEQ/15ML (10%) PO SOLN
40.0000 meq | Freq: Once | ORAL | Status: AC
  Administered 2014-05-20: 40 meq
  Filled 2014-05-20: qty 30

## 2014-05-20 MED ORDER — SERTRALINE HCL 100 MG PO TABS
100.0000 mg | ORAL_TABLET | Freq: Every day | ORAL | Status: DC
  Administered 2014-05-20 – 2014-05-21 (×2): 100 mg via ORAL
  Filled 2014-05-20 (×3): qty 1

## 2014-05-20 NOTE — Progress Notes (Signed)
Subjective: Significantly improved today. Speaking very clearly, says she is hungry despite starting tube feeds. Almost to goal. No recent abdominal pain or chest pain.   Objective: Vital signs in last 24 hours: Filed Vitals:   05/20/14 0800 05/20/14 0900 05/20/14 1000 05/20/14 1100  BP: 121/68 113/85 96/59 98/63   Pulse: 103  120 103  Temp:      TempSrc:      Resp: 18 16 16 14   Height:      Weight:      SpO2: 100%  100% 100%   Weight change: 1 lb 5.2 oz (0.6 kg)  Intake/Output Summary (Last 24 hours) at 05/20/14 1154 Last data filed at 05/20/14 1100  Gross per 24 hour  Intake 1115.67 ml  Output   2195 ml  Net -1079.33 ml   General: resting in bed, NAD. Speaking clearly.  HEENT: PERRL, EOMI, no scleral icterus, trach in place w/ passe muir valve.  Cardiac: Tachycardic, no rubs, murmurs or gallops Pulm: Transmitted sounds from trach, otherwise clear to auscultation bilaterally, moving normal volumes of air. No wheeze. Abd: Soft, nontender, nondistended, BS present, PEG site w/o erythema or ttp Ext: Warm and well perfused, no pedal edema Neuro: Alert and oriented X3, cranial nerves II-XII grossly intact  Lab Results: Basic Metabolic Panel:  Recent Labs Lab 05/17/14 0003  05/19/14 0946 05/20/14 0500  NA 145  < > 136* 138  K 3.9  < > 3.5* 3.2*  CL 111  < > 98 100  CO2 20  < > 26 26  GLUCOSE 126*  < > 195* 179*  BUN 34*  < > 18 19  CREATININE 0.97  < > 0.62 0.69  CALCIUM 7.9*  < > 7.4* 7.8*  MG 1.9  --   --   --   < > = values in this interval not displayed. Liver Function Tests:  Recent Labs Lab 05/15/14 1725  AST 24  ALT 12  ALKPHOS 101  BILITOT 0.2*  PROT 8.5*  ALBUMIN 4.4    CBC:  Recent Labs Lab 05/15/14 1753  05/19/14 0352 05/20/14 0500  WBC 28.0*  < > 5.2 7.4  NEUTROABS 22.9*  --   --   --   HGB 10.3*  < > 11.2* 9.7*  HCT 34.1*  < > 34.6* 29.4*  MCV 65.5*  < > 60.7* 61.1*  PLT 382  < > 180 169  < > = values in this interval not  displayed. Cardiac Enzymes:  Recent Labs Lab 05/16/14 1815 05/17/14 0003 05/17/14 0826  TROPONINI 2.68* 1.24* 0.53*   BNP:  Recent Labs Lab 05/17/14 0003  PROBNP 3608.0*   CBG:  Recent Labs Lab 05/19/14 0729 05/19/14 1406 05/19/14 1652 05/19/14 1949 05/19/14 2327 05/20/14 0830  GLUCAP 190* 334* 195* 280* 160* 259*   Hemoglobin A1C:  Recent Labs Lab 05/16/14 1015  HGBA1C 9.5*   Thyroid Function Tests:  Recent Labs Lab 05/16/14 0545  TSH 1.700   Anemia Panel:  Recent Labs Lab 05/16/14 1015  VITAMINB12 >2000*  FOLATE >20.0  FERRITIN 199  TIBC 238*  IRON 20*  RETICCTPCT 0.9   Urine Drug Screen: Drugs of Abuse     Component Value Date/Time   LABOPIA NONE DETECTED 05/15/2014 2100   COCAINSCRNUR NONE DETECTED 05/15/2014 2100   LABBENZ NONE DETECTED 05/15/2014 2100   AMPHETMU NONE DETECTED 05/15/2014 2100   THCU NONE DETECTED 05/15/2014 2100   LABBARB NONE DETECTED 05/15/2014 2100    Urinalysis:  Recent Labs  Lab 05/15/14 2100  COLORURINE YELLOW  LABSPEC 1.022  PHURINE 5.5  GLUCOSEU >1000*  HGBUR SMALL*  BILIRUBINUR NEGATIVE  KETONESUR >80*  PROTEINUR NEGATIVE  UROBILINOGEN 0.2  NITRITE NEGATIVE  LEUKOCYTESUR NEGATIVE   Micro Results: Recent Results (from the past 240 hour(s))  Blood culture (routine x 2)     Status: None (Preliminary result)   Collection Time: 05/15/14  5:53 PM  Result Value Ref Range Status   Specimen Description BLOOD ARM RIGHT  Final   Special Requests BOTTLES DRAWN AEROBIC AND ANAEROBIC 10CC  Final   Culture  Setup Time   Final    05/16/2014 01:20 Performed at [facility]    Culture   Final           BLOOD CULTURE RECEIVED NO GROWTH TO DATE CULTURE WILL BE HELD FOR 5 DAYS BEFORE ISSUING A FINAL NEGATIVE REPORT Performed at [facility]    Report Status PENDING  Incomplete  Blood culture (routine x 2)     Status: None (Preliminary result)   Collection Time: 05/15/14  6:09 PM  Result Value  Ref Range Status   Specimen Description BLOOD FOREARM LEFT  Final   Special Requests BOTTLES DRAWN AEROBIC AND ANAEROBIC 5CC  Final   Culture  Setup Time   Final    05/16/2014 01:22 Performed at [facility]    Culture   Final           BLOOD CULTURE RECEIVED NO GROWTH TO DATE CULTURE WILL BE HELD FOR 5 DAYS BEFORE ISSUING A FINAL NEGATIVE REPORT Performed at [facility]    Report Status PENDING  Incomplete  MRSA PCR Screening     Status: Abnormal   Collection Time: 05/16/14  1:36 AM  Result Value Ref Range Status   MRSA by PCR POSITIVE (A) NEGATIVE Final    Comment:        The GeneXpert MRSA Assay (FDA approved for NASAL specimens only), is one component of a comprehensive MRSA colonization surveillance program. It is not intended to diagnose MRSA infection nor to guide or monitor treatment for MRSA infections. RESULT CALLED TO, READ BACK BY AND VERIFIED WITH: CALLED TO RN AVERY DANIEL [phone] @0445  THANEY  Culture, respiratory (NON-Expectorated)     Status: None   Collection Time: 05/16/14  8:35 AM  Result Value Ref Range Status   Specimen Description TRACHEAL ASPIRATE  Final   Special Requests NONE  Final   Gram Stain   Final    FEW WBC PRESENT,BOTH PMN AND MONONUCLEAR RARE SQUAMOUS EPITHELIAL CELLS PRESENT MODERATE GRAM POSITIVE COCCI IN PAIRS IN CLUSTERS FEW GRAM POSITIVE RODS Performed at [facility]    Culture   Final    ABUNDANT METHICILLIN RESISTANT STAPHYLOCOCCUS AUREUS Note: RIFAMPIN AND GENTAMICIN SHOULD NOT BE USED AS SINGLE DRUGS FOR TREATMENT OF STAPH INFECTIONS. This organism is presumed to be Clindamycin resistant based on detection of inducible Clindamycin resistance. CRITICAL RESULT CALLED TO, READ BACK BY AND  VERIFIED WITH: [name] @950am  11.3.15 BY OANA Performed at [facility]    Report Status 05/19/2014 FINAL  Final   Organism ID, Bacteria METHICILLIN RESISTANT STAPHYLOCOCCUS AUREUS  Final      Susceptibility    Methicillin resistant staphylococcus aureus - MIC*    CLINDAMYCIN RESISTANT      ERYTHROMYCIN >=8 RESISTANT Resistant     GENTAMICIN <=0.5 SENSITIVE Sensitive     LEVOFLOXACIN >=8 RESISTANT Resistant     OXACILLIN >=4 RESISTANT Resistant  RIFAMPIN <=0.5 SENSITIVE Sensitive     TRIMETH/SULFA <=10 SENSITIVE Sensitive     VANCOMYCIN 1 SENSITIVE Sensitive     TETRACYCLINE <=1 SENSITIVE Sensitive     PENICILLIN >=0.5 RESISTANT Resistant     * ABUNDANT METHICILLIN RESISTANT STAPHYLOCOCCUS AUREUS   Studies/Results: Dg Abd Portable 1v  05/19/2014   CLINICAL DATA:  Pain upper mid abdomen.  EXAM: PORTABLE ABDOMEN - 1 VIEW  COMPARISON:  05/16/2014.  FINDINGS: Left upper quadrant gastrostomy tube again demonstrated. Gas and stool in the colon. No small or large bowel distention. No radiopaque stones. Visualized bones appear intact.  IMPRESSION: Nonobstructive bowel gas pattern.   Electronically Signed   By: Burman Nieves M.D.   On: 05/19/2014 01:28   Medications: I have reviewed the patient's current medications. Scheduled Meds: . antiseptic oral rinse  7 mL Mouth Rinse q12n4p  . aspirin EC  81 mg Oral Daily  . atorvastatin  80 mg Oral q1800  . chlorhexidine  15 mL Mouth Rinse BID  . Chlorhexidine Gluconate Cloth  6 each Topical Q0600  . insulin aspart  0-9 Units Subcutaneous 6 times per day  . insulin glargine  7 Units Subcutaneous BID  . metoprolol tartrate  12.5 mg Oral BID   Or  . metoprolol tartrate  12.5 mg Oral BID  . mupirocin ointment  1 application Nasal BID  . sodium chloride  10-40 mL Intracatheter Q12H   Continuous Infusions: . feeding supplement (GLUCERNA 1.2 CAL) 1,000 mL (05/20/14 0400)   PRN Meds:.acetaminophen (TYLENOL) oral liquid 160 mg/5 mL, dextrose, ipratropium-albuterol, ondansetron (ZOFRAN) IV, sodium chloride, sodium chloride, sodium chloride Assessment/Plan:  NSTEMI: Cardiac catheterization without significant coronary disease. Did suggest abnormal RCA. Most  likely 2/2 CHF in addition to significant DKA on admission. ECHO from 05/18/14 showed EF of 25-30% w/ severely reduced systolic function.  -Heparin gtt discontinued -Continue Lipitor, ASA, Lopressor 12.5 mg bid -Transfer to med-surg today  DKA resolved: AG closed, acidosis resolved. Started on Lantus + ISS q4h w/ tube feeds, CBG's slightly elevated today.  -Change to Lantus 7 units bid + ISS q4h -Tube feeds; patient failed SLP evaluation; SLP continuing to follow; for MBS possibly tomorrow for further swallow evaluation.  -K supplementation as necessary per tube  Microcytic anemia: Stable. Hb 10.6 most recently. Patient on ferrous sulfate 325 mg daily at home. Repeat Iron studies show low T Sat (8%), low TIBC (238), and low Iron (20). 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  -CBC in AM  -Hold iron for now  Mental disorder: Stable.  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  -Restart Methadone 5 mg bid per tube if showing signs of withdrawal.   DTV PPx: Heparin gtt  Dispo: Disposition is deferred at this time, awaiting improvement of current medical problems.  Anticipated discharge in approximately 1-2 day(s).   The patient does have a current PCP (Rich Number, MD) and does need an [facility] hospital follow-up appointment after discharge.  The patient does not have transportation limitations that hinder transportation to clinic appointments.  .Services Needed at time of discharge: Y = Yes, Blank = No PT:   OT:   RN:   Equipment:   Other:     LOS: 5 days   Courtney Paris, MD 05/20/2014, 11:54 AM

## 2014-05-20 NOTE — Progress Notes (Signed)
Inpatient Diabetes Program Recommendations  AACE/ADA: New Consensus Statement on Inpatient Glycemic Control (2013)  Target Ranges:  Prepandial:   less than 140 mg/dL      Peak postprandial:   less than 180 mg/dL (1-2 hours)      Critically ill patients:  140 - 180 mg/dL   Reason for Assessment:  Results for [name], [name] (MRN [id number]) as of 05/20/2014 09:44  Ref. Range 05/19/2014 14:06 05/19/2014 16:52 05/19/2014 19:49 05/19/2014 23:27 05/20/2014 08:30  Glucose-Capillary Latest Range: 70-99 mg/dL 308334 (H) 657195 (H) 846280 (H) 160 (H) 259 (H)   Diabetes history:  Type 1 diabetes Outpatient Diabetes medications: Lantus 10 units bid and Novolog 0-12 units tid with meals. Current orders for Inpatient glycemic control:  Novolog sensitive q 4 hours, Lantus 7 units q HS.  Consider increasing Lantus to 7 units bid.  Thanks, Beryl MeagerJenny Tallin Hart, RN, BC-ADM Inpatient Diabetes Coordinator Pager [phone]

## 2014-05-20 NOTE — Progress Notes (Signed)
  Date: 05/20/2014  Patient name: [name]  Medical record number: [id number]  Date of birth: [birth year]   This patient has been seen and the plan of care was discussed with the house staff. Please see their note for complete details. I concur with their findings with the following additions/corrections:  Pt c/o hunger.  Cath site is clean, dressed.  FSG have improved.  Cath shows patent arteries.   [name] SmartJeffrey C Hatcher, MD 05/20/2014, 8:45 AM
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
## 2014-05-20 NOTE — Progress Notes (Signed)
Subjective: The patient says she is extremely hungry and has been requesting apple juice and food. Informed that due to results of the swallow study, she will have to receive her nutrition via PEG.  Objective: Vital signs in last 24 hours: Filed Vitals:   05/20/14 0900 05/20/14 1000 05/20/14 1100 05/20/14 1243  BP: 113/85 96/59 98/63    Pulse:  120 103   Temp:    98.9 F (37.2 C)  TempSrc:      Resp: 16 16 14    Height:      Weight:      SpO2:  100% 100%    Weight change: 0.6 kg (1 lb 5.2 oz)  Intake/Output Summary (Last 24 hours) at 05/20/14 1429 Last data filed at 05/20/14 1100  Gross per 24 hour  Intake 1060.17 ml  Output   1395 ml  Net -334.83 ml   Physical Exam: General: Awake, alert, oriented, answering questions appropriately.  HEENT:  Moist mucus membranes. Trach collar in place Lungs: Scattered coarse breath sounds, otherwise clear to auscultation. No wheezing.  Heart: Regular rate and rhythm. no murmurs, gallops, or rubs Abdomen: Soft, non-tender, non-distended, BS +. Left-sided PEG tube in place. Clean, dry, intact.  Extremities: No cyanosis, clubbing, or edema   Lab Results: Basic Metabolic Panel:  Recent Labs Lab 05/17/14 0003  05/19/14 0946 05/20/14 0500  NA 145  < > 136* 138  K 3.9  < > 3.5* 3.2*  CL 111  < > 98 100  CO2 20  < > 26 26  GLUCOSE 126*  < > 195* 179*  BUN 34*  < > 18 19  CREATININE 0.97  < > 0.62 0.69  CALCIUM 7.9*  < > 7.4* 7.8*  MG 1.9  --   --   --   < > = values in this interval not displayed. Liver Function Tests:  Recent Labs Lab 05/15/14 1725  AST 24  ALT 12  ALKPHOS 101  BILITOT 0.2*  PROT 8.5*  ALBUMIN 4.4   CBC:  Recent Labs Lab 05/15/14 1753  05/19/14 0352 05/20/14 0500  WBC 28.0*  < > 5.2 7.4  NEUTROABS 22.9*  --   --   --   HGB 10.3*  < > 11.2* 9.7*  HCT 34.1*  < > 34.6* 29.4*  MCV 65.5*  < > 60.7* 61.1*  PLT 382  < > 180 169  < > = values in this interval not displayed.  CBG:  Recent  Labs Lab 05/19/14 1406 05/19/14 1652 05/19/14 1949 05/19/14 2327 05/20/14 0830 05/20/14 1236  GLUCAP 334* 195* 280* 160* 259* 273*       Medications: I have reviewed the patient's current medications. Scheduled Meds: . antiseptic oral rinse  7 mL Mouth Rinse q12n4p  . aspirin EC  81 mg Oral Daily  . atorvastatin  80 mg Oral q1800  . chlorhexidine  15 mL Mouth Rinse BID  . Chlorhexidine Gluconate Cloth  6 each Topical Q0600  . insulin aspart  0-9 Units Subcutaneous 6 times per day  . insulin glargine  7 Units Subcutaneous BID  . metoprolol tartrate  12.5 mg Oral BID   Or  . metoprolol tartrate  12.5 mg Oral BID  . mupirocin ointment  1 application Nasal BID  . sodium chloride  10-40 mL Intracatheter Q12H   Continuous Infusions: . feeding supplement (GLUCERNA 1.2 CAL) 1,000 mL (05/20/14 0400)   PRN Meds:.acetaminophen (TYLENOL) oral liquid 160 mg/5 mL, dextrose, ipratropium-albuterol, ondansetron (ZOFRAN) IV, sodium  chloride, sodium chloride, sodium chloride  Assessment/Plan: 48 y/o F w/ PMHx of DM type I, microcytic anemia, HLD, CAD, HTN, trach and PEG dependent, w/ h/o polysubstance abuse, admitted for severe DKA.   NSTEMI: Patient w/ troponin elevation, peaking at 2.68. Downtrending 0.53 on 11/1.On Heparin gtt. CXR w/ mild volume overload. ECHO demonstrates reduced EF of 25%-30%. Cath demonstrates that vessels are patent, no need for intervention. -Keep NPO for now -Continue Heparin gtt -Cath today -Continue Lipitor, ASA, Lopressor 12.5 mg bid  DKA: Resolved. AG closed. Difficult situation w/ respect to CBG's and fluid status. Tube feeds have been started after cath. Must remain NPO given failure of swallow study by SLP -Given elevated BG (200s), will advance insulin regimen to Lantus 7 BID. -PICC in place -Keep NPO given failure of swallow study, SLP following -K supplementation as necessary per tube; with PICC in place.  - Tube feeds at 5745ml/hr with goal of  855ml/hr  Anion Gap Metabolic Acidosis w/ Respiratory Compensation: Resolved as above.   -Insulin as above -BMP tomorrow  Microcytic anemia: Stable. Hb 9.67most recently. Patient on ferrous sulfate 325 mg daily at home. Repeat Iron studies show low T Sat (8%), low TIBC (238), and low Iron (20). 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  -CBC in AM  -Consider iron via PEG tube  Mental disorder: Stable. Mental status significantly improved.  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  -Restart Methadone 5 mg bid per tube if showing signs of withdrawal.   DTV PPx: Heparin gtt  Dispo: Disposition is deferred at this time, awaiting improvement of current medical problems.  Anticipated discharge in approximately 1-2 day(s).   The patient does have a current PCP (Rich Numberarly Rivet, MD) and [facility] after discharge.  The patient does not have transportation limitations that hinder transportation to clinic appointments.  .Services Needed at time of discharge: Y = Yes, Blank = No PT:   OT:   RN:   Equipment:   Other:     LOS: 5 days   Karl PockJeremy N Niyla Marone, Med Student 05/20/2014, 2:29 PM

## 2014-05-20 NOTE — Progress Notes (Signed)
Speech Language Pathology Treatment: [name] Speaking valve  Patient Details Name: [name] MRN: [id number] DOB: 1965-12-01 Today's Date: 05/20/2014 Time: [id number] SLP Time Calculation (min): 11 min  Assessment / Plan / Recommendation Clinical Impression  Pt appears stronger today. Communicating well with PMSV in place since this morning per RN. RN reports pt may be DC'd tomorrow. Would like to complete objective study via MBS prior to DC, to determine least restrictive po diet level. Recommend continued therapy after DC for training and education re: PMSV care and use.   HPI HPI: 48 year old female admitted 05/15/14 due to AMS. PMH significant for DM, trach placement (November 2013), PEG (July 2015). Orders received for BSE. Pt with PEG tube, but taking food po at home.   Pertinent Vitals Pain Assessment: No/denies pain  SLP Plan    pending MBS.   Recommendations Diet recommendations: NPO Medication Administration: Via alternative means      Patient may use Passy-Muir Speech Valve: During all waking hours (remove during sleep) PMSV Supervision: Intermittent       Oral Care Recommendations: Oral care BID    GO   [name], [facility]P, [name] [phone]  [name], [name] 05/20/2014, 3:06 PM

## 2014-05-20 NOTE — Progress Notes (Addendum)
Patient ID: [name], female   DOB: Aug 19, 1965, 48 y.o.   MRN: [id number]  Cardiology Progress note  Patient Name: [name] Date of Encounter: 05/20/2014     Principal Problem:   DKA (diabetic ketoacidosis) Active Problems:   Diabetes mellitus type 1, uncontrolled, insulin dependent   HLD (hyperlipidemia)   Microcytic anemia   Substance abuse   Mental disorder   Hypotension   Metabolic acidosis with respiratory alkalosis   Acute encephalopathy   EKG abnormalities   NSTEMI (non-ST elevated myocardial infarction)   Diabetic ketoacidosis without coma associated with type 1 diabetes mellitus   Acute systolic congestive heart failure, NYHA class 4    SUBJECTIVE No events overnight. Breathing has improved somewhat. Cath yesterday demonstrated no obstructive CAD. Unable to engage the RCA. Low EDP, unable to access the femoral vein. EF reported to be 45%, which is unusual given her echo findings. Diuresed another 1L overnight. Weight stable around 110 lbs.  CURRENT MEDS . antiseptic oral rinse  7 mL Mouth Rinse q12n4p  . aspirin EC  81 mg Oral Daily  . atorvastatin  80 mg Oral q1800  . chlorhexidine  15 mL Mouth Rinse BID  . Chlorhexidine Gluconate Cloth  6 each Topical Q0600  . insulin aspart  0-9 Units Subcutaneous 6 times per day  . insulin glargine  7 Units Subcutaneous QHS  . metoprolol tartrate  12.5 mg Oral BID   Or  . metoprolol tartrate  12.5 mg Oral BID  . mupirocin ointment  1 application Nasal BID  . potassium chloride  40 mEq Per Tube Once  . sodium chloride  10-40 mL Intracatheter Q12H    OBJECTIVE  Filed Vitals:   05/20/14 0500 05/20/14 0600 05/20/14 0700 05/20/14 0800  BP: 100/59 111/63 107/66 121/68  Pulse: 94 89 93 103  Temp:      TempSrc:      Resp: 16 13 11 18   Height:      Weight: 111 lb 15.9 oz (50.8 kg)     SpO2: 100% 100% 100% 100%    Intake/Output Summary (Last 24 hours) at 05/20/14 0908 Last data filed at 05/20/14 0800  Gross per 24 hour  Intake 1047.67 ml  Output   2210 ml  Net -1162.33 ml   Filed Weights   05/16/14 1500 05/19/14 0500 05/20/14 0500  Weight: 113 lb 12.1 oz (51.6 kg) 110 lb 10.7 oz (50.2 kg) 111 lb 15.9 oz (50.8 kg)    PHYSICAL EXAM  General: Pleasant, trach tube in place, NAD. Neuro: Alert and oriented X 3. Moves all extremities spontaneously. Psych: Normal affect. HEENT:  Normal  Neck: Supple without bruits or JVD. Trach tube in place Lungs:  Resp regular and unlabored, CTA. Heart: RRR no s3, s4, or murmurs. Abdomen: Soft, non-tender, non-distended, BS + x 4.  Extremities: No clubbing, cyanosis or edema. DP/PT/Radials 2+ and equal bilaterally.  Accessory Clinical Findings  CBC  Recent Labs  05/19/14 0352 05/20/14 0500  WBC 5.2 7.4  HGB 11.2* 9.7*  HCT 34.6* 29.4*  MCV 60.7* 61.1*  PLT 180 169   Basic Metabolic Panel  Recent Labs  05/19/14 0946 05/20/14 0500  NA 136* 138  K 3.5* 3.2*  CL 98 100  CO2 26 26  GLUCOSE 195* 179*  BUN 18 19  CREATININE 0.62 0.69  CALCIUM 7.4* 7.8*   Liver Function Tests No results for input(s): AST, ALT, ALKPHOS, BILITOT, PROT, ALBUMIN in the last 72 hours. No results for input(s):  LIPASE, AMYLASE in the last 72 hours. Cardiac Enzymes No results for input(s): CKTOTAL, CKMB, CKMBINDEX, TROPONINI in the last 72 hours. BNP Invalid input(s): POCBNP D-Dimer No results for input(s): DDIMER in the last 72 hours. Hemoglobin A1C No results for input(s): HGBA1C in the last 72 hours. Fasting Lipid Panel No results for input(s): CHOL, HDL, LDLCALC, TRIG, CHOLHDL, LDLDIRECT in the last 72 hours. Thyroid Function Tests No results for input(s): TSH, T4TOTAL, T3FREE, THYROIDAB in the last 72 hours.  Invalid input(s): FREET3  TELE  Nsr/sinus tachy  Radiology/Studies  Ct Head Wo Contrast  05/15/2014   CLINICAL DATA:  Altered mental status.  EXAM: CT HEAD WITHOUT CONTRAST  TECHNIQUE: Contiguous axial images were obtained from the  base of the skull through the vertex without intravenous contrast.  COMPARISON:  None.  FINDINGS: No intra-axial or extra-axial pathologic fluid or blood collection. No mass lesion. No hydrocephalus. No hemorrhage. No acute bony abnormality  IMPRESSION: No acute abnormality.   Electronically Signed   By: Maisie Fushomas  Register   On: 05/15/2014 19:30   Dg Chest Port 1 View  05/17/2014   CLINICAL DATA:  Diabetic ketoacidosis  EXAM: PORTABLE CHEST - 1 VIEW  COMPARISON:  May 17, 2014 study obtained earlier in the day  FINDINGS: Tracheostomy catheter tip is 2.6 cm above the carina. No pneumothorax. There is persistent interstitial edema with alveolar edema in the left parahilar region, stable. No new opacity. No change in cardiac silhouette. No adenopathy.  IMPRESSION: Findings felt to represent a degree of congestive heart failure. No new opacity. No change in cardiac silhouette.   Electronically Signed   By: Bretta BangWilliam  Woodruff M.D.   On: 05/17/2014 11:20   Dg Chest Port 1 View  05/17/2014   CLINICAL DATA:  Productive cough ; hypertension  EXAM: PORTABLE CHEST - 1 VIEW  COMPARISON:  May 15, 2014  FINDINGS: Tracheostomy catheter tip is 3.3 cm above the carina. No pneumothorax. There is generalized interstitial edema with patchy alveolar edema in the left perihilar region. Lungs elsewhere clear. There is cardiomegaly, mild, with pulmonary venous hypertension. No adenopathy. No bone lesions.  IMPRESSION: Evidence of congestive heart failure. Tracheostomy as described without pneumothorax.   Electronically Signed   By: Bretta BangWilliam  Woodruff M.D.   On: 05/17/2014 11:07   Dg Chest Port 1 View  05/15/2014   CLINICAL DATA:  Altered mental status for 1 day. Patient has a mental illness. Unresponsive today.  EXAM: PORTABLE CHEST - 1 VIEW  COMPARISON:  01/18/2013  FINDINGS: Tracheostomy tube in place. Tip is 2.1 cm from the carina. Upper normal heart size. Clear lungs.  IMPRESSION: Tracheostomy tube in place.  No active  cardiopulmonary disease.   Electronically Signed   By: Maryclare BeanArt  Hoss M.D.   On: 05/15/2014 18:34   Dg Abd Portable 1v  05/19/2014   CLINICAL DATA:  Pain upper mid abdomen.  EXAM: PORTABLE ABDOMEN - 1 VIEW  COMPARISON:  05/16/2014.  FINDINGS: Left upper quadrant gastrostomy tube again demonstrated. Gas and stool in the colon. No small or large bowel distention. No radiopaque stones. Visualized bones appear intact.  IMPRESSION: Nonobstructive bowel gas pattern.   Electronically Signed   By: Burman NievesWilliam  Stevens M.D.   On: 05/19/2014 01:28   Dg Abd Portable 1v  05/16/2014   CLINICAL DATA:  PEG placement  EXAM: PORTABLE ABDOMEN - 1 VIEW  COMPARISON:  08/22/2012  FINDINGS: Scattered large and small bowel gas is noted. A gastrostomy catheter is noted within the gastric lumen. No  other focal abnormality is noted.  IMPRESSION: Gastrostomy catheter in place.   Electronically Signed   By: Alcide Clever M.D.   On: 05/16/2014 21:15   Echo: Study Conclusions  - Left ventricle: The cavity size was normal. Wall thickness was normal. Systolic function was severely reduced. The estimated ejection fraction was in the range of 25% to 30%. Severe hypokinesis of the entireinferolateral, inferior, and inferoseptal myocardium. Hypokinesis of the mid-apicalanteroseptal, anterior, and apical myocardium. Due to tachycardia, there was fusion of early and atrial contributions to ventricular filling. Doppler parameters are consistent with elevated mean left atrial filling pressure. Acoustic contrast opacification revealed no evidence ofthrombus. - Mitral valve: There was mild to moderate regurgitation directed centrally and posteriorly. - Right ventricle: Systolic function was mildly reduced.  Impressions:  - Compared to 2006, LV function is substantially worse.  ASSESSMENT AND PLAN   1. NSTEMI - ?related to CHF. No significant CAD. On ASA, lipitor, lopressor. No clear indication for Plavix, given lack  of demonstrated CAD, despite elevated troponin. 2. Acute systolic congestive heart failure - Echo yesterday shows newly reduced EF of 25-30% with regional wall motion abnormalities, however, LV gram suggests EF 45%.  Probably can back off on lasix for now. Will need medication optimization for CHF. Consider adding ACE-I or ARB if BP tolerates prior to d/c. 3. DKA - resolved. BG's improved 4. Hypokalemia - replete.  Chrystie Nose, MD, [facility] Attending Cardiologist [facility]   05/20/2014 9:08 AM

## 2014-05-21 ENCOUNTER — Inpatient Hospital Stay (HOSPITAL_COMMUNITY): Payer: Medicaid Other

## 2014-05-21 DIAGNOSIS — Z431 Encounter for attention to gastrostomy: Secondary | ICD-10-CM | POA: Insufficient documentation

## 2014-05-21 LAB — GLUCOSE, CAPILLARY
GLUCOSE-CAPILLARY: 314 mg/dL — AB (ref 70–99)
GLUCOSE-CAPILLARY: 246 mg/dL — AB (ref 70–99)
GLUCOSE-CAPILLARY: 204 mg/dL — AB (ref 70–99)
GLUCOSE-CAPILLARY: 194 mg/dL — AB (ref 70–99)
Glucose-Capillary: 136 mg/dL — ABNORMAL HIGH (ref 70–99)
Glucose-Capillary: 148 mg/dL — ABNORMAL HIGH (ref 70–99)
Glucose-Capillary: 155 mg/dL — ABNORMAL HIGH (ref 70–99)

## 2014-05-21 LAB — BASIC METABOLIC PANEL
Anion gap: 14 (ref 5–15)
BUN: 23 mg/dL (ref 6–23)
CHLORIDE: 101 meq/L (ref 96–112)
CO2: 26 meq/L (ref 19–32)
Calcium: 8.6 mg/dL (ref 8.4–10.5)
Creatinine, Ser: 0.61 mg/dL (ref 0.50–1.10)
GFR calc Af Amer: >90 mL/min (ref >90)
GFR calc non Af Amer: >90 mL/min (ref >90)
Glucose, Bld: 273 mg/dL — ABNORMAL HIGH (ref 70–99)
POTASSIUM: 3.8 meq/L (ref 3.7–5.3)
Sodium: 141 mEq/L (ref 137–147)

## 2014-05-21 LAB — CBC
HCT: 28.1 % — ABNORMAL LOW (ref 36.0–46.0)
Hemoglobin: 8.9 g/dL — ABNORMAL LOW (ref 12.0–15.0)
MCH: 19.3 pg — ABNORMAL LOW (ref 26.0–34.0)
MCHC: 31.7 g/dL (ref 30.0–36.0)
MCV: 60.8 fL — ABNORMAL LOW (ref 78.0–100.0)
Platelets: 192 10*3/uL (ref 150–400)
RBC: 4.62 MIL/uL (ref 3.87–5.11)
RDW: 17 % — ABNORMAL HIGH (ref 11.5–15.5)
WBC: 5.4 10*3/uL (ref 4.0–10.5)

## 2014-05-21 MED ORDER — LOSARTAN POTASSIUM 25 MG PO TABS
25.0000 mg | ORAL_TABLET | Freq: Every day | ORAL | Status: DC
  Administered 2014-05-21 – 2014-05-22 (×2): 25 mg via ORAL
  Filled 2014-05-21 (×3): qty 1

## 2014-05-21 MED ORDER — FUROSEMIDE 10 MG/ML PO SOLN
20.0000 mg | Freq: Every day | ORAL | Status: DC
  Administered 2014-05-21: 20 mg
  Filled 2014-05-21 (×3): qty 2

## 2014-05-21 MED ORDER — INSULIN GLARGINE 100 UNIT/ML ~~LOC~~ SOLN
10.0000 [IU] | Freq: Two times a day (BID) | SUBCUTANEOUS | Status: DC
  Administered 2014-05-21 – 2014-05-22 (×3): 10 [IU] via SUBCUTANEOUS
  Filled 2014-05-21 (×5): qty 0.1

## 2014-05-21 MED ORDER — INSULIN ASPART 100 UNIT/ML ~~LOC~~ SOLN: 0.0000 [IU] | Freq: Every day | SUBCUTANEOUS | Status: DC

## 2014-05-21 MED ORDER — INSULIN ASPART 100 UNIT/ML ~~LOC~~ SOLN
0.0000 [IU] | Freq: Three times a day (TID) | SUBCUTANEOUS | Status: DC
  Administered 2014-05-21: 3 [IU] via SUBCUTANEOUS
  Administered 2014-05-21: 2 [IU] via SUBCUTANEOUS
  Administered 2014-05-22: 3 [IU] via SUBCUTANEOUS
  Administered 2014-05-22: 2 [IU] via SUBCUTANEOUS

## 2014-05-21 NOTE — Progress Notes (Signed)
Occupational Therapy Treatment Patient Details Name: [name] MRN: [id number] DOB: [birth year] Today's Date: 05/21/2014    History of present illness In brief, 48 y/o female with PMH of type 1 DM, HTN, s/p trach/PEG following substance abuse, hypothyroidism who p/w AMS. Pt was discharged from SNF and lives with her mother. Per mother she was refusing insulin and was altered several hours prior to coming to ED with DKA, NSTEMI    OT comments  Pt progressing nicely towards goals.  Currently, she requires supervision for BADLs  Follow Up Recommendations  No OT follow up;Supervision/Assistance - 24 hour    Equipment Recommendations  None recommended by OT    Recommendations for Other Services      Precautions / Restrictions Precautions Precautions: Fall Precaution Comments: trach, peg       Mobility Bed Mobility                  Transfers Overall transfer level: Needs assistance   Transfers: Sit to/from Stand;Stand Pivot Transfers Sit to Stand: Supervision Stand pivot transfers: Supervision            Balance Overall balance assessment: Needs assistance   Sitting balance-Leahy Scale: Good     Standing balance support: During functional activity Standing balance-Leahy Scale: Good                     ADL       Grooming: Wash/dry hands;Wash/dry face;Oral care;Brushing hair;Supervision/safety;Standing       Lower Body Bathing: Supervison/ safety;Sit to/from stand       Lower Body Dressing: Supervision/safety;Sit to/from stand   Toilet Transfer: Supervision/safety;Ambulation;Comfort height toilet   Toileting- Clothing Manipulation and Hygiene: Supervision/safety;Sit to/from stand         General ADL Comments: Pt initially refused OT, but with encouragement williningly participated       Vision                     Perception     Praxis      Cognition   Behavior During Therapy: Flat affect Overall Cognitive Status: No  family/caregiver present to determine baseline cognitive functioning                       Extremity/Trunk Assessment               Exercises     Shoulder Instructions       General Comments      Pertinent Vitals/ Pain       Pain Assessment: No/denies pain  Home Living                                          Prior Functioning/Environment              Frequency Min 2X/week     Progress Toward Goals  OT Goals(current goals can now be found in the care plan section)  Progress towards OT goals: Progressing toward goals  ADL Goals Pt Will Perform Grooming: with modified independence;standing Pt Will Perform Upper Body Bathing: with modified independence;sitting Pt Will Perform Lower Body Bathing: with modified independence;sit to/from stand Pt Will Perform Upper Body Dressing: with modified independence;sitting Pt Will Perform Lower Body Dressing: with modified independence;sit to/from stand Pt Will Transfer to Toilet: with modified independence;ambulating;regular height toilet;grab bars Pt Will Perform Toileting - Clothing  Manipulation and hygiene: with modified independence;sit to/from stand  Plan Discharge plan remains appropriate    Co-evaluation                 End of Session Equipment Utilized During Treatment: Oxygen   Activity Tolerance Patient tolerated treatment well   Patient Left in chair;with call bell/phone within reach;with nursing/sitter in room   Nurse Communication Mobility status        Time: [id number] OT Time Calculation (min): 24 min  Charges: OT General Charges $OT Visit: 1 Procedure OT Treatments $Therapeutic Activity: 23-37 mins  [name] 05/21/2014, 12:56 PM

## 2014-05-21 NOTE — Consult Note (Addendum)
WOC wound consult note Reason for Consult: Consult requested for G-tube site. Wound type: small amt erosion surrounding tube insertion area. Drainage (amount, consistency, odor) small amt tan drainage, no odor. Dressing procedure/placement/frequency: Barrier cream to repel moisture and protect skin, then apply drain sponge and push flange down to secure in place and reduce erosion to areas surrounding tube. If drainage increases or infection is a concern, consider sending a culture.  Please re-consult if further assistance is needed.  Thank-you,  Cammie Mcgeeawn Emanuelle Bastos MSN, RN, CWOCN, [place], CNS [phone]

## 2014-05-21 NOTE — Progress Notes (Signed)
[name] [id number]  Transfer Data: 05/21/2014 3:01 PM  Attending Provider: [name] SmartJeffrey C Hatcher, MD  WUJ:[id number]:Rivet, Carly, MD  Code Status: Full  Nicole DistanceLenora [name] is a 48 y.o. female patient transferred from 2S  -No acute distress noted.  -No complaints of shortness of breath.  -No complaints of chest pain.   Blood pressure 121/96, pulse 85, temperature 98.7 F (37.1 C), temperature source Oral, resp. rate 18, height 5\' 1"  (1.549 m), weight 51.1 kg (112 lb 10.5 oz), last menstrual period 01/05/2014, SpO2 100 %.  ?   Allergies: Review of patient's allergies indicates no known allergies.  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  Past Surgical History:  has past surgical history that includes Appendectomy; Eye surgery; Direct laryngoscopy (05/21/2012); esophagoscopy (05/21/2012); and Tracheostomy tube placement (05/21/2012).  Social History:  reports that she quit smoking about 12 years ago. She has never used smokeless tobacco. She reports that she does not drink alcohol or use illicit drugs.   Patient/Family orientated to room. Information packet given to patient/family. Admission inpatient armband information verified with patient/family to include name and date of birth and placed on patient arm. Side rails up x 2, fall assessment and education completed with patient/family. Patient/family able to verbalize understanding of risk associated with falls and verbalized understanding to call for assistance before getting out of bed. Call light within reach. Patient/family able to voice and demonstrate understanding of unit orientation  instructions.  Will continue to evaluate and treat per MD orders.

## 2014-05-21 NOTE — Progress Notes (Signed)
Subjective: Patient is hungry but has no other complaints. She is tolerating her tube feeds. The nurse has noticed some malodorous, grayish-white discharge around her PEG site.  Objective: Vital signs in last 24 hours: Filed Vitals:   05/21/14 0822 05/21/14 0900 05/21/14 1053 05/21/14 1100  BP:  135/68 142/76 146/74  Pulse:  88 99 93  Temp: 98.4 F (36.9 C)     TempSrc: Oral     Resp:  20 18 16   Height:      Weight:      SpO2:  100% 99% 100%   Weight change: 0.3 kg (10.6 oz)  Intake/Output Summary (Last 24 hours) at 05/21/14 1150 Last data filed at 05/21/14 0900  Gross per 24 hour  Intake   1335 ml  Output    550 ml  Net    785 ml   Physical Exam: General: Awake, alert, oriented, answering questions appropriately.  HEENT:  Moist mucus membranes. Trach collar in place Lungs: Scattered coarse breath sounds, otherwise clear to auscultation. No wheezing.  Heart: Regular rate and rhythm. no murmurs, gallops, or rubs Abdomen: Soft, non-tender, non-distended, BS +. Left-side PEG tube has a rim of malodorous, gray white discharge with a rim of tissue firmness around the site. The site is nontender to palpation. Extremities: No cyanosis, clubbing, or edema   Lab Results: Basic Metabolic Panel:  Recent Labs Lab 05/17/14 0003  05/20/14 0500 05/21/14 0500  NA 145  < > 138 141  K 3.9  < > 3.2* 3.8  CL 111  < > 100 101  CO2 20  < > 26 26  GLUCOSE 126*  < > 179* 273*  BUN 34*  < > 19 23  CREATININE 0.97  < > 0.69 0.61  CALCIUM 7.9*  < > 7.8* 8.6  MG 1.9  --   --   --   < > = values in this interval not displayed. Liver Function Tests:  Recent Labs Lab 05/15/14 1725  AST 24  ALT 12  ALKPHOS 101  BILITOT 0.2*  PROT 8.5*  ALBUMIN 4.4   CBC:  Recent Labs Lab 05/15/14 1753  05/20/14 0500 05/21/14 0500  WBC 28.0*  < > 7.4 5.4  NEUTROABS 22.9*  --   --   --   HGB 10.3*  < > 9.7* 8.9*  HCT 34.1*  < > 29.4* 28.1*  MCV 65.5*  < > 61.1* 60.8*  PLT 382  < > 169  192  < > = values in this interval not displayed.  CBG:  Recent Labs Lab 05/20/14 1236 05/20/14 1622 05/20/14 1940 05/21/14 0002 05/21/14 0356 05/21/14 0814  GLUCAP 273* 221* 155* 136* 314* 246*       Medications: I have reviewed the patient's current medications. Scheduled Meds: . antiseptic oral rinse  7 mL Mouth Rinse q12n4p  . aspirin EC  81 mg Oral Daily  . atorvastatin  80 mg Oral q1800  . chlorhexidine  15 mL Mouth Rinse BID  . enoxaparin (LOVENOX) injection  40 mg Subcutaneous Q24H  . ferrous sulfate  325 mg Oral BID WC  . furosemide  20 mg Per Tube Daily  . insulin aspart  0-9 Units Subcutaneous 6 times per day  . insulin glargine  10 Units Subcutaneous BID  . losartan  25 mg Oral Daily  . metoprolol tartrate  12.5 mg Oral BID   Or  . metoprolol tartrate  12.5 mg Oral BID  . QUEtiapine  50 mg Oral  Daily  . sertraline  100 mg Oral QHS  . sodium chloride  10-40 mL Intracatheter Q12H   Continuous Infusions: . feeding supplement (GLUCERNA 1.2 CAL) Stopped (05/21/14 0900)   PRN Meds:.acetaminophen (TYLENOL) oral liquid 160 mg/5 mL, clonazePAM, dextrose, ipratropium-albuterol, ondansetron (ZOFRAN) IV, sodium chloride, sodium chloride, sodium chloride  Assessment/Plan: 48 y/o F w/ PMHx of DM type I, microcytic anemia, HLD, CAD, HTN, trach and PEG dependent, w/ h/o polysubstance abuse, admitted for severe DKA.   NSTEMI: Patient w/ troponin elevation, peaking at 2.68. Downtrending 0.53 on 11/1.On Heparin gtt. CXR w/ mild volume overload. ECHO demonstrates reduced EF of 25%-30%. Cath demonstrates that vessels are patent, no need for intervention. -Begin Losartan 25 mg daily -Cath shows patent vessels -Continue Lipitor, ASA, Lopressor 12.5 mg bid  DKA: Resolved. AG closed.Tube feeds at goal of 6555ml/hr .  -Given elevated BG (200s-300s), will advance insulin regimen to Lantus 10BID. -Passed swallow study, advance to carb modified diet -K supplementation as necessary  per tube - Will decrease tube feeds with increased PO intake  Microcytic anemia: Hgb at 8.9 from 9.7 yesterday most recently. Patient on ferrous sulfate 325 mg daily at home. Repeat Iron studies show low T Sat (8%), low TIBC (238), and low Iron (20). 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  - Start Iron Sulfate 325 mg bid  Mental disorder: Stable. No  -Restart klonopin 0.5 BID prn anxiety -Restart seroquel 50mg  daily  -Restart sertraline 100mg  QHS  -Hold geodon 20mg  BID   H/o Substance Abuse:  Do not start methadone as she has not been taking it at home  DTV PPx: Lovenox 40mg  [place]  Dispo: Anticipated discharge in approximately 1 day.  The patient does have a current PCP (Nicole Numberarly Rivet, MD) and [facility].  The patient does not have transportation limitations that hinder transportation to clinic appointments.  .Services Needed at time of discharge: Y = Yes, Blank = No PT:   OT:   RN:   Equipment:   Other:     LOS: 6 days   [name], Med Student 05/21/2014, 11:50 AM
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
## 2014-05-21 NOTE — Progress Notes (Signed)
Patient ID: [name] DistanceLenora Rosiles, female   DOB: 23-Sep-1965, 48 y.o.   MRN: [id number]  Cardiology Progress note  Patient Name: [name] DistanceLenora Wescoat Date of Encounter: 05/21/2014     Principal Problem:   DKA (diabetic ketoacidosis) Active Problems:   Diabetes mellitus type 1, uncontrolled, insulin dependent   HLD (hyperlipidemia)   Microcytic anemia   Substance abuse   Mental disorder   Hypotension   Metabolic acidosis with respiratory alkalosis   Acute encephalopathy   EKG abnormalities   NSTEMI (non-ST elevated myocardial infarction)   Diabetic ketoacidosis without coma associated with type 1 diabetes mellitus   Acute systolic congestive heart failure, NYHA class 4    SUBJECTIVE No events overnight. Clinically improving. Weight is climbing slightly, however, she is on tube feeds now. BP improved and stable. HR remains elevated.  CURRENT MEDS . antiseptic oral rinse  7 mL Mouth Rinse q12n4p  . aspirin EC  81 mg Oral Daily  . atorvastatin  80 mg Oral q1800  . chlorhexidine  15 mL Mouth Rinse BID  . enoxaparin (LOVENOX) injection  40 mg Subcutaneous Q24H  . ferrous sulfate  325 mg Oral BID WC  . insulin aspart  0-9 Units Subcutaneous 6 times per day  . insulin glargine  10 Units Subcutaneous BID  . metoprolol tartrate  12.5 mg Oral BID   Or  . metoprolol tartrate  12.5 mg Oral BID  . QUEtiapine  50 mg Oral Daily  . sertraline  100 mg Oral QHS  . sodium chloride  10-40 mL Intracatheter Q12H    OBJECTIVE  Filed Vitals:   05/21/14 0600 05/21/14 0700 05/21/14 0714 05/21/14 0822  BP: 109/67 123/57 123/57   Pulse: 101 104 93   Temp:    98.4 F (36.9 C)  TempSrc:    Oral  Resp: 16 12 15    Height:      Weight:      SpO2: 100% 100% 100%     Intake/Output Summary (Last 24 hours) at 05/21/14 0903 Last data filed at 05/21/14 0700  Gross per 24 hour  Intake   1285 ml  Output    650 ml  Net    635 ml   Filed Weights   05/19/14 0500 05/20/14 0500 05/21/14 0500  Weight:  110 lb 10.7 oz (50.2 kg) 111 lb 15.9 oz (50.8 kg) 112 lb 10.5 oz (51.1 kg)    PHYSICAL EXAM  General: Pleasant, trach tube in place, NAD. Neuro: Alert and oriented X 3. Moves all extremities spontaneously. Psych: Normal affect. HEENT:  Normal  Neck: Supple without bruits or JVD. Trach tube in place Lungs:  Resp regular and unlabored, CTA. Heart: RRR no s3, s4, or murmurs. Abdomen: Soft, non-tender, non-distended, BS + x 4.  Extremities: No clubbing, cyanosis or edema. DP/PT/Radials 2+ and equal bilaterally.  Accessory Clinical Findings  CBC  Recent Labs  05/20/14 0500 05/21/14 0500  WBC 7.4 5.4  HGB 9.7* 8.9*  HCT 29.4* 28.1*  MCV 61.1* 60.8*  PLT 169 192   Basic Metabolic Panel  Recent Labs  05/20/14 0500 05/21/14 0500  NA 138 141  K 3.2* 3.8  CL 100 101  CO2 26 26  GLUCOSE 179* 273*  BUN 19 23  CREATININE 0.69 0.61  CALCIUM 7.8* 8.6   Liver Function Tests No results for input(s): AST, ALT, ALKPHOS, BILITOT, PROT, ALBUMIN in the last 72 hours. No results for input(s): LIPASE, AMYLASE in the last 72 hours. Cardiac Enzymes No results for  input(s): CKTOTAL, CKMB, CKMBINDEX, TROPONINI in the last 72 hours. BNP Invalid input(s): POCBNP D-Dimer No results for input(s): DDIMER in the last 72 hours. Hemoglobin A1C No results for input(s): HGBA1C in the last 72 hours. Fasting Lipid Panel No results for input(s): CHOL, HDL, LDLCALC, TRIG, CHOLHDL, LDLDIRECT in the last 72 hours. Thyroid Function Tests No results for input(s): TSH, T4TOTAL, T3FREE, THYROIDAB in the last 72 hours.  Invalid input(s): FREET3  TELE  Nsr/sinus tachy  Radiology/Studies  Ct Head Wo Contrast  05/15/2014   CLINICAL DATA:  Altered mental status.  EXAM: CT HEAD WITHOUT CONTRAST  TECHNIQUE: Contiguous axial images were obtained from the base of the skull through the vertex without intravenous contrast.  COMPARISON:  None.  FINDINGS: No intra-axial or extra-axial pathologic fluid or  blood collection. No mass lesion. No hydrocephalus. No hemorrhage. No acute bony abnormality  IMPRESSION: No acute abnormality.   Electronically Signed   By: Maisie Fus  Register   On: 05/15/2014 19:30   Dg Chest Port 1 View  05/17/2014   CLINICAL DATA:  Diabetic ketoacidosis  EXAM: PORTABLE CHEST - 1 VIEW  COMPARISON:  May 17, 2014 study obtained earlier in the day  FINDINGS: Tracheostomy catheter tip is 2.6 cm above the carina. No pneumothorax. There is persistent interstitial edema with alveolar edema in the left parahilar region, stable. No new opacity. No change in cardiac silhouette. No adenopathy.  IMPRESSION: Findings felt to represent a degree of congestive heart failure. No new opacity. No change in cardiac silhouette.   Electronically Signed   By: Bretta Bang M.D.   On: 05/17/2014 11:20   Dg Chest Port 1 View  05/17/2014   CLINICAL DATA:  Productive cough ; hypertension  EXAM: PORTABLE CHEST - 1 VIEW  COMPARISON:  May 15, 2014  FINDINGS: Tracheostomy catheter tip is 3.3 cm above the carina. No pneumothorax. There is generalized interstitial edema with patchy alveolar edema in the left perihilar region. Lungs elsewhere clear. There is cardiomegaly, mild, with pulmonary venous hypertension. No adenopathy. No bone lesions.  IMPRESSION: Evidence of congestive heart failure. Tracheostomy as described without pneumothorax.   Electronically Signed   By: Bretta Bang M.D.   On: 05/17/2014 11:07   Dg Chest Port 1 View  05/15/2014   CLINICAL DATA:  Altered mental status for 1 day. Patient has a mental illness. Unresponsive today.  EXAM: PORTABLE CHEST - 1 VIEW  COMPARISON:  01/18/2013  FINDINGS: Tracheostomy tube in place. Tip is 2.1 cm from the carina. Upper normal heart size. Clear lungs.  IMPRESSION: Tracheostomy tube in place.  No active cardiopulmonary disease.   Electronically Signed   By: Maryclare Bean M.D.   On: 05/15/2014 18:34   Dg Abd Portable 1v  05/19/2014   CLINICAL DATA:  Pain  upper mid abdomen.  EXAM: PORTABLE ABDOMEN - 1 VIEW  COMPARISON:  05/16/2014.  FINDINGS: Left upper quadrant gastrostomy tube again demonstrated. Gas and stool in the colon. No small or large bowel distention. No radiopaque stones. Visualized bones appear intact.  IMPRESSION: Nonobstructive bowel gas pattern.   Electronically Signed   By: Burman Nieves M.D.   On: 05/19/2014 01:28   Dg Abd Portable 1v  05/16/2014   CLINICAL DATA:  PEG placement  EXAM: PORTABLE ABDOMEN - 1 VIEW  COMPARISON:  08/22/2012  FINDINGS: Scattered large and small bowel gas is noted. A gastrostomy catheter is noted within the gastric lumen. No other focal abnormality is noted.  IMPRESSION: Gastrostomy catheter in place.  Electronically Signed   By: Alcide CleverMark  Lukens M.D.   On: 05/16/2014 21:15   Echo: Study Conclusions  - Left ventricle: The cavity size was normal. Wall thickness was normal. Systolic function was severely reduced. The estimated ejection fraction was in the range of 25% to 30%. Severe hypokinesis of the entireinferolateral, inferior, and inferoseptal myocardium. Hypokinesis of the mid-apicalanteroseptal, anterior, and apical myocardium. Due to tachycardia, there was fusion of early and atrial contributions to ventricular filling. Doppler parameters are consistent with elevated mean left atrial filling pressure. Acoustic contrast opacification revealed no evidence ofthrombus. - Mitral valve: There was mild to moderate regurgitation directed centrally and posteriorly. - Right ventricle: Systolic function was mildly reduced.  Impressions:  - Compared to 2006, LV function is substantially worse.  ASSESSMENT AND PLAN   1. NSTEMI - ?related to CHF. No significant CAD. On ASA, lipitor, lopressor. No clear indication for Plavix, given lack of demonstrated CAD, despite elevated troponin. On aspirin, metoprolol.  2. Acute systolic congestive heart failure - Echo yesterday shows newly  reduced EF of 25-30% with regional wall motion abnormalities, however, LV gram suggests EF 45%. +600 cc yesterday with reduced urine output. Recommend maintenance dose of lasix 20 mg per tube daily. Add low dose losartan 25 mg daily per tube. 3. DKA - resolved. BG's improved 4. Hypokalemia - replete.  Chrystie NoseKenneth C. Hilty, MD, [facility] MishawakaFACC Attending Cardiologist [facility]   05/21/2014 9:03 AM

## 2014-05-21 NOTE — Progress Notes (Signed)
Report called to [name], 5W RN. Pt transferred to 5W-10 via wheelchair. Meds in chart, belongings at bedside, VS stable at time of transfer. No questions or complaints at this time. Family updated on room change by pt.  [name]
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
## 2014-05-22 DIAGNOSIS — I5023 Acute on chronic systolic (congestive) heart failure: Secondary | ICD-10-CM

## 2014-05-22 DIAGNOSIS — Z931 Gastrostomy status: Secondary | ICD-10-CM

## 2014-05-22 LAB — CBC
HCT: 28.8 % — ABNORMAL LOW (ref 36.0–46.0)
Hemoglobin: 9.1 g/dL — ABNORMAL LOW (ref 12.0–15.0)
MCH: 19.5 pg — AB (ref 26.0–34.0)
MCHC: 31.6 g/dL (ref 30.0–36.0)
MCV: 61.7 fL — ABNORMAL LOW (ref 78.0–100.0)
Platelets: 185 10*3/uL (ref 150–400)
RBC: 4.67 MIL/uL (ref 3.87–5.11)
RDW: 17.2 % — ABNORMAL HIGH (ref 11.5–15.5)
WBC: 4.6 10*3/uL (ref 4.0–10.5)

## 2014-05-22 LAB — CULTURE, BLOOD (ROUTINE X 2)
CULTURE: NO GROWTH
Culture: NO GROWTH

## 2014-05-22 LAB — BASIC METABOLIC PANEL
Anion gap: 11 (ref 5–15)
BUN: 13 mg/dL (ref 6–23)
CALCIUM: 8.4 mg/dL (ref 8.4–10.5)
CHLORIDE: 94 meq/L — AB (ref 96–112)
CO2: 27 meq/L (ref 19–32)
Creatinine, Ser: 0.55 mg/dL (ref 0.50–1.10)
GFR calc Af Amer: >90 mL/min (ref >90)
GFR calc non Af Amer: >90 mL/min (ref >90)
GLUCOSE: 192 mg/dL — AB (ref 70–99)
Potassium: 3.9 mEq/L (ref 3.7–5.3)
SODIUM: 132 meq/L — AB (ref 137–147)

## 2014-05-22 LAB — GLUCOSE, CAPILLARY
Glucose-Capillary: 197 mg/dL — ABNORMAL HIGH (ref 70–99)
Glucose-Capillary: 221 mg/dL — ABNORMAL HIGH (ref 70–99)

## 2014-05-22 MED ORDER — INSULIN GLARGINE 100 UNIT/ML ~~LOC~~ SOLN
12.0000 [IU] | Freq: Two times a day (BID) | SUBCUTANEOUS | Status: DC
  Filled 2014-05-22: qty 0.12

## 2014-05-22 MED ORDER — ATORVASTATIN CALCIUM 80 MG PO TABS: 80.0000 mg | ORAL_TABLET | Freq: Every day | ORAL | Status: DC

## 2014-05-22 MED ORDER — ASPIRIN 81 MG PO TBEC: 81.0000 mg | DELAYED_RELEASE_TABLET | Freq: Every day | ORAL | Status: DC

## 2014-05-22 MED ORDER — METOPROLOL TARTRATE 25 MG PO TABS: 12.5000 mg | ORAL_TABLET | Freq: Two times a day (BID) | ORAL | Status: DC

## 2014-05-22 MED ORDER — INSULIN GLARGINE 100 UNIT/ML ~~LOC~~ SOLN: 12.0000 [IU] | Freq: Two times a day (BID) | SUBCUTANEOUS | Status: DC

## 2014-05-22 MED ORDER — LOSARTAN POTASSIUM 25 MG PO TABS: 25.0000 mg | ORAL_TABLET | Freq: Every day | ORAL | Status: DC
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
## 2014-05-22 NOTE — Progress Notes (Signed)
Clinically stable on appropriate CHF medications. Will need cardiology follow-up in 1-2 weeks with either myself or [facility]. No further suggestions at this time. Will sign-off, call with questions.   Chrystie NoseKenneth C. Demarrius Guerrero, MD, [facility] Attending Cardiologist [facility]

## 2014-05-22 NOTE — Progress Notes (Signed)
Pt walking with PT at time of check. Pt walking on RA spo2 100%, tolerating well.

## 2014-05-22 NOTE — Progress Notes (Signed)
CARE MANAGEMENT NOTE 05/22/2014  Patient:  [name] DistanceENNIX,Whisper   Account Number:  [id number]  Date Initiated:  05/22/2014  Documentation initiated by:  [facility]anna [place]  Subjective/Objective Assessment:   DKA, NSTEMI     Action/Plan:   lives at home with husband   Anticipated DC Date:  05/23/2014   Anticipated DC Plan:  HOME W HOME HEALTH SERVICES      DC Planning Services  CM consult      [facility]   Choice offered to / List presented to:          [facility]  HH-1 RN  HH-4 NURSE'S AIDE  HH-6 SOCIAL WORKER  HH-5 SPEECH THERAPY      HH agency  [facility]   Status of service:  Completed, signed off Medicare Important Message given?   (If response is "NO", the following Medicare IM given date fields will be blank) Date Medicare IM given:   Medicare IM given by:   Date Additional Medicare IM given:   Additional Medicare IM given by:    Discharge Disposition:  HOME W HOME HEALTH SERVICES  Per UR Regulation:  Reviewed for med. necessity/level of care/duration of stay  If discussed at Long Length of Stay Meetings, dates discussed:    Comments:  05/22/2014 1320 Pt will be followed as High Risk Admission. [facility], aide, SW and SLP. Isidoro DonningAlesia Kailani Brass RN CCM Case Mgmt phone [phone]

## 2014-05-22 NOTE — Progress Notes (Signed)
Nsg Discharge Note  Admit Date:  05/15/2014 Discharge date: 05/22/2014   [name] to be D/C'd Home with home health per MD order.  AVS completed.  Copy for chart, and copy for patient signed, and dated. Patient/caregiver able to verbalize understanding.  Discharge Medication:   Medication List    STOP taking these medications        lisinopril 2.5 MG tablet  Commonly known as:  PRINIVIL,ZESTRIL     methadone 5 MG tablet  Commonly known as:  DOLOPHINE     ziprasidone 20 MG capsule  Commonly known as:  GEODON      TAKE these medications        acidophilus Caps capsule  Take 1 capsule by mouth daily.     albuterol (2.5 MG/3ML) 0.083% nebulizer solution  Commonly known as:  PROVENTIL  Take 3 mLs (2.5 mg total) by nebulization every 6 (six) hours as needed for wheezing.     albuterol 108 (90 BASE) MCG/ACT inhaler  Commonly known as:  PROVENTIL HFA;VENTOLIN HFA  Inhale 2 puffs into the lungs every 4 (four) hours as needed for wheezing or shortness of breath.     aspirin 81 MG EC tablet  Take 1 tablet (81 mg total) by mouth daily.     atorvastatin 80 MG tablet  Commonly known as:  LIPITOR  Take 1 tablet (80 mg total) by mouth daily at 6 PM.     clonazePAM 0.5 MG tablet  Commonly known as:  KLONOPIN  Take 0.5 mg by mouth 2 (two) times daily as needed for anxiety.     dextrose 40 % Gel  Commonly known as:  GLUTOSE  Take 37.5 g by mouth as needed (low blood sugar).     famotidine 20 MG tablet  Commonly known as:  PEPCID  Take 20 mg by mouth 2 (two) times daily.     ferrous sulfate 325 (65 FE) MG tablet  Take 325 mg by mouth 2 (two) times daily with a meal.     glucose blood test strip  Commonly known as:  ACCU-CHEK SMARTVIEW  1 each by Other route 6 (six) times daily. Test at least 6 times daily anytime you feel your sugar is low. 250.03. Poorly controlled DM with freq and severe hypoglycemia     insulin aspart 100 UNIT/ML injection  Commonly known as:   novoLOG  Inject 0-12 Units into the skin 3 (three) times daily before meals. Patient uses sliding scale     insulin glargine 100 UNIT/ML injection  Commonly known as:  LANTUS  Inject 0.12 mLs (12 Units total) into the skin 2 (two) times daily.     losartan 25 MG tablet  Commonly known as:  COZAAR  Take 1 tablet (25 mg total) by mouth daily.     metoprolol tartrate 25 MG tablet  Commonly known as:  LOPRESSOR  Take 0.5 tablets (12.5 mg total) by mouth 2 (two) times daily.     multivitamin with minerals Tabs tablet  Take 1 tablet by mouth daily.     naproxen 500 MG tablet  Commonly known as:  NAPROSYN  Take 1 tablet (500 mg total) by mouth 2 (two) times daily.     QUEtiapine 50 MG tablet  Commonly known as:  SEROQUEL  Take 50 mg by mouth daily.     sertraline 100 MG tablet  Commonly known as:  ZOLOFT  Take 100 mg by mouth at bedtime.     vitamin C 500 MG  tablet  Commonly known as:  ASCORBIC ACID  Take 500 mg by mouth daily.        Discharge Assessment: Filed Vitals:   05/22/14 1613  BP:   Pulse: 120  Temp:   Resp: 18   Skin clean, dry and intact without evidence of skin break down, no evidence of skin tears noted. IV catheter discontinued intact. Site without signs and symptoms of complications - no redness or edema noted at insertion site, patient denies c/o pain - only slight tenderness at site.  Dressing with slight pressure applied.  D/c Instructions-Education: Discharge instructions given to patient/family with verbalized understanding. D/c education completed with patient/family including follow up instructions, medication list, d/c activities limitations if indicated, with other d/c instructions as indicated by MD - patient able to verbalize understanding, all questions fully answered. Patient instructed to return to ED, call 911, or call MD for any changes in condition.  Patient escorted via WC, and D/C home via private auto.  [name], [name] L, RN 05/22/2014  5:01 PM

## 2014-05-22 NOTE — Progress Notes (Signed)
Per MD order, PICC line removed. Cath intact at 33cm. Vaseline pressure gauze to site, pressure held x 5min. No bleeding to site. Pt instructed to keep dressing CDI x 24 hours. Avoid heavy lifting, pushing or pulling x 24 hours,  If bleeding occurs hold pressure, if bleeding does not stop contact MD or go to the ED. Pt does not have any questions. Consuello Masseimmons, Laketa Sandoz M

## 2014-05-22 NOTE — Progress Notes (Signed)
Speech Language Pathology Treatment: [name] Speaking valve  Patient Details Name: [name] MRN: [id number] DOB: 10/23/1965 Today's Date: 05/22/2014 Time: 6063-0160 SLP Time Calculation (min): 20 min  Assessment / Plan / Recommendation Clinical Impression  Pt. Seen for education re: PMSV.  Pt. demonstrated valve donn and doff with several cues/reminders to use left hand to stabilize trach and decrease pressure when placing.  Pt. Communicated with 100% intelligibility and vitals stable.  Verbal education provided for cleaning, storing and reminders for use (remove when sleep).  Explained and gave her a handout on the [facility] trach clinic (trach is 49 years old).  No f/u ST needed at this time.   HPI HPI: 48 year old female with PMH of respiratory failure s/p trach and PEG due to substance abuse, DM, HTN, hyopthyroidism admitted with AMS, ? DKA, NSTEMI.    Pertinent Vitals Pain Assessment: No/denies pain  SLP Plan  All goals met    Recommendations        Patient may use Passy-Muir Speech Valve: During all waking hours (remove during sleep) PMSV Supervision: Intermittent       Oral Care Recommendations: Oral care BID Follow up Recommendations: None Plan: All goals met    GO     [name] 05/22/2014, 12:24 PM   [name].Ed [employer] [phone]

## 2014-05-22 NOTE — Discharge Instructions (Signed)
1. You have a follow up appointment as scheduled:  Nicole ApoSamaya J Qureshi, MD  On 05/26/2014 9:15 AM  [address] [place] [id number] [phone]  2. Please take all medications as prescribed.   Start taking Losartan 25 mg daily instead of Lisinopril  Take Aspirin 81 mg daily  Take Metoprolol 12.5 mg twice daily  Continue to take Lantus insulin. Increase to 12 units twice daily.   Continue sliding scale Novolog with meals.   3. If you have worsening of your symptoms or new symptoms arise, please call the clinic [phone]), or go to the ER immediately if symptoms are severe.  Diabetic Ketoacidosis Diabetic ketoacidosis (DKA) is a life-threatening complication of type 1 diabetes. It must be quickly recognized and treated. Treatment requires hospitalization. CAUSES  When there is no insulin in the body, glucose (sugar) cannot be used, and the body breaks down fat for energy. When fat breaks down, acids (ketones) build up in the blood. Very high levels of glucose and high levels of acids lead to severe loss of body fluids (dehydration) and other dangerous chemical changes. This stresses your vital organs and can cause coma or death. SIGNS AND SYMPTOMS   Tiredness (fatigue).  Weight loss.  Excessive thirst.  Ketones in your urine.  Light-headedness.  Fruity or sweet smelling breath.  Excessive urination.  Visual changes.  Confusion or irritability.  Nausea or vomiting.  Rapid breathing.  Stomachache or abdominal pain. DIAGNOSIS  Your health care provider will diagnose DKA based on your history, physical exam, and blood tests. The health care provider will check to see if you have another illness that caused you to go into DKA. Most of this will be done quickly in an emergency room. TREATMENT   Fluid replacement to correct dehydration.  Insulin.  Correction of electrolytes, such as potassium and sodium.  Antibiotic medicines. PREVENTION  Always take your  insulin. Do not skip your insulin injections.  If you are sick, treat yourself quickly. Your body often needs more insulin to fight the illness.  Check your blood glucose regularly.  Check urine ketones if your blood glucose is greater than 240 milligrams per deciliter (mg/dL).  Do not use outdated (expired) insulin.  If your blood glucose is high, drink plenty of fluids. This helps flush out ketones. HOME CARE INSTRUCTIONS   If you are sick, follow the advice of your health care provider.  To prevent dehydration, drink enough water and fluids to keep your urine clear or pale yellow.  If you cannot eat, alternate between drinking fluids with sugar (soda, juices, flavored gelatin) and salty fluids (broth, bouillon).  If you can eat, follow your usual diet and drink sugar-free liquids (water, diet drinks).  Always take your usual dose of insulin. If you cannot eat or if your glucose is getting too low, call your health care provider for further instructions.  Continue to monitor your blood or urine ketones every 3-4 hours around the clock. Set your alarm clock or have someone wake you up. If you are too sick, have someone test it for you.  Rest and avoid exercise. SEEK MEDICAL CARE IF:   You have a fever.  You have ketones in your urine, or your blood glucose is higher than a level your health care provider suggests. You may need extra insulin. Call your health care provider if you need advice on adjusting your insulin.  You cannot drink at least a tablespoon (15 mL) of fluid every 15-20 minutes.  You have been vomiting for more than 2 hours.  You have symptoms of DKA:  Fruity smelling breath.  Breathing faster or slower.  Becoming very sleepy. SEEK IMMEDIATE MEDICAL CARE IF:   You have signs of dehydration:  Decreased urination.  Increased thirst.  Dry skin and mouth.  Light-headedness.  Your blood glucose is very high (as advised by your health care provider)  twice in a row.  You faint.  You have chest pain or trouble breathing.  You have a sudden, severe headache.  You have sudden weakness in one arm or one leg.  You have sudden trouble speaking or swallowing.  You have vomiting or diarrhea that is getting worse after 3 hours.  You have abdominal pain. MAKE SURE YOU:   Understand these instructions.  Will watch your condition.  Will get help right away if you are not doing well or get worse. Document Released: 06/30/2000 Document Revised: 07/08/2013 Document Reviewed: 01/06/2009 [facility] Patient Information 2015 [employer] [place]. This information is not intended to replace advice given to you by your health care provider. Make sure you discuss any questions you have with your health care provider.

## 2014-05-22 NOTE — Progress Notes (Signed)
Physical Therapy Treatment Patient Details Name: [name] MRN: [id number] DOB: August 12, 1965 Today's Date: 05/22/2014    History of Present Illness In brief, 48 y/o female with PMH of type 1 DM, HTN, s/p trach/PEG following substance abuse, hypothyroidism who p/w AMS. Pt was discharged from SNF and lives with her mother. Per mother she was refusing insulin and was altered several hours prior to coming to ED with DKA, NSTEMI     PT Comments    Progressing well,  Sats on RA and passy-muir valve were at 100% during gait, but EHR was 130 bpm.  Follow Up Recommendations  Home health PT;Supervision for mobility/OOB     Equipment Recommendations  None recommended by PT    Recommendations for Other Services       Precautions / Restrictions Precautions Precautions: Fall Precaution Comments: trach, peg Restrictions Weight Bearing Restrictions: No    Mobility  Bed Mobility                  Transfers Overall transfer level: Needs assistance Equipment used: 1 person hand held assist Transfers: Sit to/from Stand Sit to Stand: Supervision Stand pivot transfers: Supervision       General transfer comment: safe transfers  Ambulation/Gait Ambulation/Gait assistance: Supervision Ambulation Distance (Feet): 200 Feet Assistive device: None Gait Pattern/deviations: Step-through pattern Gait velocity: moderate speed Gait velocity interpretation: Below normal speed for age/gender General Gait Details: steady gait thoughout, even stopped and pulled up her socks , holding with one hand.   Stairs            Wheelchair Mobility    Modified Rankin (Stroke Patients Only)       Balance Overall balance assessment: Needs assistance   Sitting balance-Leahy Scale: Good       Standing balance-Leahy Scale: Good                      Cognition Arousal/Alertness: Awake/alert Behavior During Therapy: WFL for tasks assessed/performed Overall Cognitive Status:  No family/caregiver present to determine baseline cognitive functioning                      Exercises General Exercises - Lower Extremity Heel Slides: AROM;Strengthening;Both;10 reps;Other (comment) (resisted flexion and extension) Hip ABduction/ADduction: AROM;Both;15 reps;Seated Straight Leg Raises: AROM;Both;10 reps;Seated Other Exercises Other Exercises: tricep and bicep press x10 reps each bilaterally  in sitting without trunk supported.    General Comments        Pertinent Vitals/Pain Pain Assessment: No/denies pain    Home Living                      Prior Function            PT Goals (current goals can now be found in the care plan section) Acute Rehab PT Goals Patient Stated Goal: be able to go home PT Goal Formulation: With patient Time For Goal Achievement: 06/01/14 Potential to Achieve Goals: Good Progress towards PT goals: Progressing toward goals    Frequency  Min 3X/week    PT Plan Current plan remains appropriate    Co-evaluation             End of Session   Activity Tolerance: Patient tolerated treatment well Patient left: in chair;with call bell/phone within reach     Time: [id number] PT Time Calculation (min): 16 min  Charges:  $Gait Training: 8-22 mins  G Codes:      [name], Eliseo GumKenneth V 05/22/2014, 4:21 PM  05/22/2014  [name], PT [phone]  (pager)
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
## 2014-05-22 NOTE — Progress Notes (Signed)
Subjective: [name] is doing well this morning and is without complaints. She is tolerating her regular diet, and tube feeds have been discontinued.   Objective: Vital signs in last 24 hours: Filed Vitals:   05/22/14 0424 05/22/14 0808 05/22/14 0944 05/22/14 1000  BP: 142/60  140/66 140/68  Pulse: 81 89 100 76  Temp: 98.9 F (37.2 C)     TempSrc: Oral     Resp: 16 18  16   Height:      Weight: 51 kg (112 lb 7 oz)     SpO2: 100% 100%  99%   Weight change: -0.1 kg (-3.5 oz)  Intake/Output Summary (Last 24 hours) at 05/22/14 1125 Last data filed at 05/21/14 1831  Gross per 24 hour  Intake     80 ml  Output      0 ml  Net     80 ml   Physical Exam: General: Awake, alert, oriented, answering questions appropriately.  HEENT:  Moist mucus membranes. Trach collar in place Lungs: Scattered coarse breath sounds, otherwise clear to auscultation. No wheezing.  Heart: Regular rate and rhythm. no murmurs, gallops, or rubs Abdomen: Soft, non-tender, non-distended, BS +. Left-side PEG tube  small rim of yellowish discharge. The site is nontender to palpation. Extremities: No cyanosis, clubbing, or edema Neuro: Resting tremor noted in lower extremities   Lab Results: Basic Metabolic Panel:  Recent Labs Lab 05/17/14 0003  05/20/14 0500 05/21/14 0500  NA 145  < > 138 141  K 3.9  < > 3.2* 3.8  CL 111  < > 100 101  CO2 20  < > 26 26  GLUCOSE 126*  < > 179* 273*  BUN 34*  < > 19 23  CREATININE 0.97  < > 0.69 0.61  CALCIUM 7.9*  < > 7.8* 8.6  MG 1.9  --   --   --   < > = values in this interval not displayed.  Today's BMET pending  CBC:  Recent Labs Lab 05/15/14 1753  05/21/14 0500 05/22/14 0501  WBC 28.0*  < > 5.4 4.6  NEUTROABS 22.9*  --   --   --   HGB 10.3*  < > 8.9* 9.1*  HCT 34.1*  < > 28.1* 28.8*  MCV 65.5*  < > 60.8* 61.7*  PLT 382  < > 192 185  < > = values in this interval not displayed.  CBG:  Recent Labs Lab 05/21/14 0356 05/21/14 0814  05/21/14 1157 05/21/14 1654 05/21/14 2146 05/22/14 0759  GLUCAP 314* 246* 204* 194* 155* 197*       Medications: I have reviewed the patient's current medications. Scheduled Meds: . antiseptic oral rinse  7 mL Mouth Rinse q12n4p  . aspirin EC  81 mg Oral Daily  . atorvastatin  80 mg Oral q1800  . chlorhexidine  15 mL Mouth Rinse BID  . enoxaparin (LOVENOX) injection  40 mg Subcutaneous Q24H  . ferrous sulfate  325 mg Oral BID WC  . insulin aspart  0-5 Units Subcutaneous QHS  . insulin aspart  0-9 Units Subcutaneous TID WC  . insulin glargine  10 Units Subcutaneous BID  . losartan  25 mg Oral Daily  . metoprolol tartrate  12.5 mg Oral BID   Or  . metoprolol tartrate  12.5 mg Oral BID  . QUEtiapine  50 mg Oral Daily  . sertraline  100 mg Oral QHS  . sodium chloride  10-40 mL Intracatheter Q12H   Continuous Infusions:  PRN Meds:.acetaminophen (TYLENOL) oral liquid 160 mg/5 mL, clonazePAM, dextrose, ipratropium-albuterol, ondansetron (ZOFRAN) IV, sodium chloride, sodium chloride, sodium chloride  Assessment/Plan: 48 y/o F w/ PMHx of DM type I, microcytic anemia, HLD, CAD, HTN, trach and PEG dependent, w/ h/o polysubstance abuse, admitted for severe DKA and subsequent NSTEMI.  NSTEMI/CHF: Patient w/ troponin elevation, peaking at 2.68. Downtrending 0.53 on 11/1.On Heparin gtt. CXR w/ mild volume overload. ECHO demonstrates reduced EF of 25%-30%. Cath demonstrates that vessels are patent, no need for intervention. Cardiology has seen her today and feels her CHF regimen is appropriate.  -Continue Losartan, Lipitor, ASA, Lopressor   DKA: Resolved. Currently on regular diet. She has been on Lantus 10 BID, with CBGs from 155-197. At this point the cause of her DKA is unclear, but it could be viral or medication effect, as atypical antipsychotics are known to precipitate DKA. Of note, she was one two atypical antipsychotics, Geodon and Seroquel, on admission. -Given elevated BG , will  advance insulin regimen to Lantus 12 BID. - Patient has followed by nutritionist, who visits her at home.   Microcytic anemia: Hgb at 8.9 from 9.7 yesterday most recently. Patient on ferrous sulfate 325 mg daily at home. Repeat Iron studies show low T Sat (8%), low TIBC (238), and low Iron (20). 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  - Continue Iron Sulfate 325 mg bid  Mental disorder: Stable. Patient has a mild resting tremor on exam, likely due to restarting Seroquel. It's possible antipsychotics may have played a role in recent DKA episode and she was noted to have a prolonged QT on admission. Therefore, cautiously continuing on one of her antipsychotics. -Continue klonopin 0.5 BID prn anxiety -Continue seroquel 50mg  daily  -Continue sertraline 100mg  QHS  -Hold geodon 20mg  BID  H/o Substance Abuse:  Do not start methadone as she has not been taking it at home.   DTV PPx: Lovenox 40mg  [place]  Dispo: Anticipated discharge today.  The patient does have a current PCP (Rich Numberarly Rivet, MD) and [facility] after discharge.  The patient does not have transportation limitations that hinder transportation to clinic appointments.  .Services Needed at time of discharge: Y = Yes, Blank = No PT:   OT:   RN:   Equipment:   Other:     LOS: 7 days   Karl PockJeremy N Azyiah Bo, Med Student 05/22/2014, 11:25 AM

## 2014-05-25 ENCOUNTER — Inpatient Hospital Stay (HOSPITAL_COMMUNITY): Payer: Medicaid Other

## 2014-05-25 ENCOUNTER — Emergency Department (HOSPITAL_COMMUNITY): Payer: Medicaid Other

## 2014-05-25 ENCOUNTER — Encounter (HOSPITAL_COMMUNITY): Payer: Self-pay

## 2014-05-25 ENCOUNTER — Inpatient Hospital Stay (HOSPITAL_COMMUNITY)
Admission: EM | Admit: 2014-05-25 | Discharge: 2014-05-29 | DRG: 637 | Disposition: A | Discharge status: Home Health | Payer: Medicaid Other | Attending: Infectious Diseases | Admitting: Infectious Diseases

## 2014-05-25 DIAGNOSIS — Z87891 Personal history of nicotine dependence: Secondary | ICD-10-CM | POA: Diagnosis not present

## 2014-05-25 DIAGNOSIS — Z7982 Long term (current) use of aspirin: Secondary | ICD-10-CM

## 2014-05-25 DIAGNOSIS — R4182 Altered mental status, unspecified: Secondary | ICD-10-CM | POA: Insufficient documentation

## 2014-05-25 DIAGNOSIS — K209 Esophagitis, unspecified: Secondary | ICD-10-CM | POA: Diagnosis present

## 2014-05-25 DIAGNOSIS — Z833 Family history of diabetes mellitus: Secondary | ICD-10-CM | POA: Diagnosis not present

## 2014-05-25 DIAGNOSIS — IMO0002 Reserved for concepts with insufficient information to code with codable children: Secondary | ICD-10-CM | POA: Diagnosis present

## 2014-05-25 DIAGNOSIS — N179 Acute kidney failure, unspecified: Secondary | ICD-10-CM | POA: Diagnosis present

## 2014-05-25 DIAGNOSIS — R400 Somnolence: Secondary | ICD-10-CM

## 2014-05-25 DIAGNOSIS — E875 Hyperkalemia: Secondary | ICD-10-CM | POA: Diagnosis present

## 2014-05-25 DIAGNOSIS — E785 Hyperlipidemia, unspecified: Secondary | ICD-10-CM | POA: Diagnosis present

## 2014-05-25 DIAGNOSIS — Z818 Family history of other mental and behavioral disorders: Secondary | ICD-10-CM | POA: Diagnosis not present

## 2014-05-25 DIAGNOSIS — E876 Hypokalemia: Secondary | ICD-10-CM | POA: Diagnosis present

## 2014-05-25 DIAGNOSIS — E871 Hypo-osmolality and hyponatremia: Secondary | ICD-10-CM | POA: Diagnosis not present

## 2014-05-25 DIAGNOSIS — Z930 Tracheostomy status: Secondary | ICD-10-CM | POA: Diagnosis not present

## 2014-05-25 DIAGNOSIS — G9341 Metabolic encephalopathy: Secondary | ICD-10-CM | POA: Diagnosis present

## 2014-05-25 DIAGNOSIS — Z8249 Family history of ischemic heart disease and other diseases of the circulatory system: Secondary | ICD-10-CM

## 2014-05-25 DIAGNOSIS — E1010 Type 1 diabetes mellitus with ketoacidosis without coma: Principal | ICD-10-CM | POA: Diagnosis present

## 2014-05-25 DIAGNOSIS — E874 Mixed disorder of acid-base balance: Secondary | ICD-10-CM | POA: Diagnosis present

## 2014-05-25 DIAGNOSIS — E1065 Type 1 diabetes mellitus with hyperglycemia: Secondary | ICD-10-CM | POA: Diagnosis present

## 2014-05-25 DIAGNOSIS — R739 Hyperglycemia, unspecified: Secondary | ICD-10-CM

## 2014-05-25 DIAGNOSIS — F1010 Alcohol abuse, uncomplicated: Secondary | ICD-10-CM | POA: Diagnosis present

## 2014-05-25 DIAGNOSIS — Z931 Gastrostomy status: Secondary | ICD-10-CM | POA: Diagnosis not present

## 2014-05-25 DIAGNOSIS — Z452 Encounter for adjustment and management of vascular access device: Secondary | ICD-10-CM

## 2014-05-25 DIAGNOSIS — R579 Shock, unspecified: Secondary | ICD-10-CM

## 2014-05-25 DIAGNOSIS — E1069 Type 1 diabetes mellitus with other specified complication: Secondary | ICD-10-CM | POA: Diagnosis present

## 2014-05-25 DIAGNOSIS — E119 Type 2 diabetes mellitus without complications: Secondary | ICD-10-CM | POA: Diagnosis not present

## 2014-05-25 LAB — TYPE AND SCREEN
ABO/RH(D): A POS
Antibody Screen: NEGATIVE

## 2014-05-25 LAB — BLOOD GAS, VENOUS
ACID-BASE DEFICIT: 24.2 mmol/L — AB (ref 0.0–2.0)
Bicarbonate: 5.3 mEq/L — ABNORMAL LOW (ref 20.0–24.0)
Drawn by: 295031
O2 CONTENT: 2 L/min
O2 Saturation: 67 %
PATIENT TEMPERATURE: 94.8
TCO2: 5.5 mmol/L (ref 0–100)
pCO2, Ven: 19.5 mmHg — ABNORMAL LOW (ref 45.0–50.0)
pH, Ven: 7.046 — CL (ref 7.250–7.300)
pO2, Ven: 43.7 mmHg (ref 30.0–45.0)

## 2014-05-25 LAB — COMPREHENSIVE METABOLIC PANEL
ALK PHOS: 80 U/L (ref 39–117)
ALT: 18 U/L (ref 0–35)
AST: 25 U/L (ref 0–37)
Albumin: 2.9 g/dL — ABNORMAL LOW (ref 3.5–5.2)
BUN: 43 mg/dL — ABNORMAL HIGH (ref 6–23)
CALCIUM: 7.3 mg/dL — AB (ref 8.4–10.5)
Chloride: 94 mEq/L — ABNORMAL LOW (ref 96–112)
Creatinine, Ser: 1.86 mg/dL — ABNORMAL HIGH (ref 0.50–1.10)
GFR calc Af Amer: 36 mL/min — ABNORMAL LOW (ref >90)
GFR calc non Af Amer: 31 mL/min — ABNORMAL LOW (ref >90)
Glucose, Bld: 953 mg/dL (ref 70–99)
Potassium: 6.2 mEq/L — ABNORMAL HIGH (ref 3.7–5.3)
Sodium: 134 mEq/L — ABNORMAL LOW (ref 137–147)
TOTAL PROTEIN: 6.2 g/dL (ref 6.0–8.3)
Total Bilirubin: <0.2 mg/dL — ABNORMAL LOW (ref 0.3–1.2)

## 2014-05-25 LAB — BLOOD GAS, ARTERIAL
ACID-BASE DEFICIT: 24.7 mmol/L — AB (ref 0.0–2.0)
Bicarbonate: 4.1 mEq/L — ABNORMAL LOW (ref 20.0–24.0)
Drawn by: 295031
FIO2: 0.4 %
O2 Saturation: 91.1 %
PATIENT TEMPERATURE: 97.6
PEEP: 5 cmH2O
RATE: 30 resp/min
TCO2: 4.2 mmol/L (ref 0–100)
VT: 470 mL
pCO2 arterial: 14.5 mmHg — CL (ref 35.0–45.0)
pH, Arterial: 7.072 — CL (ref 7.350–7.450)
pO2, Arterial: 77.7 mmHg — ABNORMAL LOW (ref 80.0–100.0)

## 2014-05-25 LAB — URINALYSIS, ROUTINE W REFLEX MICROSCOPIC
BILIRUBIN URINE: NEGATIVE
Bilirubin Urine: NEGATIVE
Glucose, UA: >1000 mg/dL — AB
HGB URINE DIPSTICK: NEGATIVE
Hgb urine dipstick: NEGATIVE
Ketones, ur: 40 mg/dL — AB
Ketones, ur: 40 mg/dL — AB
Leukocytes, UA: NEGATIVE
Leukocytes, UA: NEGATIVE
Nitrite: NEGATIVE
Nitrite: NEGATIVE
PH: 5 (ref 5.0–8.0)
PH: 5 (ref 5.0–8.0)
Protein, ur: NEGATIVE mg/dL
Protein, ur: NEGATIVE mg/dL
Specific Gravity, Urine: 1.024 (ref 1.005–1.030)
Specific Gravity, Urine: 1.024 (ref 1.005–1.030)
Urobilinogen, UA: 0.2 mg/dL (ref 0.0–1.0)
Urobilinogen, UA: 0.2 mg/dL (ref 0.0–1.0)

## 2014-05-25 LAB — RAPID URINE DRUG SCREEN, HOSP PERFORMED
AMPHETAMINES: NOT DETECTED
BENZODIAZEPINES: NOT DETECTED
Barbiturates: NOT DETECTED
Cocaine: NOT DETECTED
OPIATES: NOT DETECTED
TETRAHYDROCANNABINOL: NOT DETECTED

## 2014-05-25 LAB — CBG MONITORING, ED
Glucose-Capillary: >600 mg/dL (ref 70–99)
Glucose-Capillary: >600 mg/dL (ref 70–99)
Glucose-Capillary: >600 mg/dL (ref 70–99)
Glucose-Capillary: >600 mg/dL (ref 70–99)

## 2014-05-25 LAB — BASIC METABOLIC PANEL
ANION GAP: 30 — AB (ref 5–15)
BUN: 38 mg/dL — ABNORMAL HIGH (ref 6–23)
CALCIUM: 8.4 mg/dL (ref 8.4–10.5)
CO2: 10 meq/L — AB (ref 19–32)
Chloride: 96 mEq/L (ref 96–112)
Creatinine, Ser: 1.71 mg/dL — ABNORMAL HIGH (ref 0.50–1.10)
GFR calc non Af Amer: 34 mL/min — ABNORMAL LOW (ref >90)
GFR, EST AFRICAN AMERICAN: 39 mL/min — AB (ref >90)
Glucose, Bld: 783 mg/dL (ref 70–99)
Potassium: 4.3 mEq/L (ref 3.7–5.3)
Sodium: 136 mEq/L — ABNORMAL LOW (ref 137–147)

## 2014-05-25 LAB — I-STAT BETA HCG BLOOD, ED (MC, WL, AP ONLY): I-stat hCG, quantitative: 20.2 m[IU]/mL — ABNORMAL HIGH (ref <5)

## 2014-05-25 LAB — I-STAT CHEM 8, ED
BUN: 42 mg/dL — ABNORMAL HIGH (ref 6–23)
Calcium, Ion: 1.04 mmol/L — ABNORMAL LOW (ref 1.12–1.23)
Chloride: 97 mEq/L (ref 96–112)
Creatinine, Ser: 2.1 mg/dL — ABNORMAL HIGH (ref 0.50–1.10)
Glucose, Bld: >700 mg/dL (ref 70–99)
HEMATOCRIT: 37 % (ref 36.0–46.0)
HEMOGLOBIN: 12.6 g/dL (ref 12.0–15.0)
POTASSIUM: 8 meq/L — AB (ref 3.7–5.3)
Sodium: 123 mEq/L — ABNORMAL LOW (ref 137–147)
TCO2: 6 mmol/L (ref 0–100)

## 2014-05-25 LAB — CBC WITH DIFFERENTIAL/PLATELET
BASOS PCT: 0 % (ref 0–1)
Basophils Absolute: 0 10*3/uL (ref 0.0–0.1)
EOS PCT: 0 % (ref 0–5)
Eosinophils Absolute: 0 10*3/uL (ref 0.0–0.7)
HEMATOCRIT: 31.6 % — AB (ref 36.0–46.0)
HEMOGLOBIN: 8.8 g/dL — AB (ref 12.0–15.0)
LYMPHS ABS: 1.3 10*3/uL (ref 0.7–4.0)
Lymphocytes Relative: 5 % — ABNORMAL LOW (ref 12–46)
MCH: 19.5 pg — AB (ref 26.0–34.0)
MCHC: 27.8 g/dL — ABNORMAL LOW (ref 30.0–36.0)
MCV: 69.9 fL — AB (ref 78.0–100.0)
MONOS PCT: 7 % (ref 3–12)
Monocytes Absolute: 1.8 10*3/uL — ABNORMAL HIGH (ref 0.1–1.0)
Neutro Abs: 21.9 10*3/uL — ABNORMAL HIGH (ref 1.7–7.7)
Neutrophils Relative %: 88 % — ABNORMAL HIGH (ref 43–77)
PLATELETS: 433 10*3/uL — AB (ref 150–400)
RBC: 4.52 MIL/uL (ref 3.87–5.11)
RDW: 21.5 % — ABNORMAL HIGH (ref 11.5–15.5)
WBC: 25 10*3/uL — AB (ref 4.0–10.5)

## 2014-05-25 LAB — CBC
HCT: 24.7 % — ABNORMAL LOW (ref 36.0–46.0)
Hemoglobin: 7.3 g/dL — ABNORMAL LOW (ref 12.0–15.0)
MCH: 20.1 pg — AB (ref 26.0–34.0)
MCHC: 29.6 g/dL — AB (ref 30.0–36.0)
MCV: 68 fL — ABNORMAL LOW (ref 78.0–100.0)
PLATELETS: 350 10*3/uL (ref 150–400)
RBC: 3.63 MIL/uL — AB (ref 3.87–5.11)
RDW: 20.3 % — ABNORMAL HIGH (ref 11.5–15.5)
WBC: 25.4 10*3/uL — ABNORMAL HIGH (ref 4.0–10.5)

## 2014-05-25 LAB — I-STAT CG4 LACTIC ACID, ED
LACTIC ACID, VENOUS: 8.15 mmol/L — AB (ref 0.5–2.2)
LACTIC ACID, VENOUS: 4.91 mmol/L — AB (ref 0.5–2.2)

## 2014-05-25 LAB — URINE MICROSCOPIC-ADD ON

## 2014-05-25 LAB — PROTIME-INR
INR: 1.56 — ABNORMAL HIGH (ref 0.00–1.49)
Prothrombin Time: 18.8 seconds — ABNORMAL HIGH (ref 11.6–15.2)

## 2014-05-25 LAB — POC URINE PREG, ED: Preg Test, Ur: NEGATIVE

## 2014-05-25 LAB — APTT: APTT: 24 s (ref 24–37)

## 2014-05-25 LAB — FIBRINOGEN: Fibrinogen: 260 mg/dL (ref 204–475)

## 2014-05-25 LAB — TROPONIN I: Troponin I: <0.3 ng/mL (ref <0.30)

## 2014-05-25 LAB — KETONES, QUALITATIVE

## 2014-05-25 LAB — LACTIC ACID, PLASMA
Lactic Acid, Venous: 5.4 mmol/L — ABNORMAL HIGH (ref 0.5–2.2)
Lactic Acid, Venous: 3.6 mmol/L — ABNORMAL HIGH (ref 0.5–2.2)

## 2014-05-25 LAB — ABO/RH: ABO/RH(D): A POS

## 2014-05-25 MED ORDER — HEPARIN SODIUM (PORCINE) 5000 UNIT/ML IJ SOLN
5000.0000 [IU] | Freq: Three times a day (TID) | INTRAMUSCULAR | Status: DC
  Administered 2014-05-25 – 2014-05-29 (×11): 5000 [IU] via SUBCUTANEOUS
  Filled 2014-05-25 (×18): qty 1

## 2014-05-25 MED ORDER — SODIUM CHLORIDE 0.9 % IV SOLN
INTRAVENOUS | Status: DC
  Administered 2014-05-26: 15:00:00 via INTRAVENOUS
  Administered 2014-05-27 (×2): 100 mL/h via INTRAVENOUS
  Administered 2014-05-28 – 2014-05-29 (×3): via INTRAVENOUS

## 2014-05-25 MED ORDER — PANTOPRAZOLE SODIUM 40 MG IV SOLR
40.0000 mg | Freq: Every day | INTRAVENOUS | Status: DC
  Administered 2014-05-25 – 2014-05-26 (×2): 40 mg via INTRAVENOUS
  Filled 2014-05-25 (×3): qty 40

## 2014-05-25 MED ORDER — LIDOCAINE HCL (CARDIAC) 20 MG/ML IV SOLN
INTRAVENOUS | Status: AC
  Filled 2014-05-25: qty 5

## 2014-05-25 MED ORDER — FENTANYL CITRATE 0.05 MG/ML IJ SOLN
100.0000 ug | Freq: Once | INTRAMUSCULAR | Status: AC
  Administered 2014-05-25: 100 ug via INTRAVENOUS

## 2014-05-25 MED ORDER — STERILE WATER FOR INJECTION IV SOLN: INTRAVENOUS | Status: DC

## 2014-05-25 MED ORDER — ASPIRIN 81 MG PO CHEW: 324.0000 mg | CHEWABLE_TABLET | ORAL | Status: AC

## 2014-05-25 MED ORDER — ROCURONIUM BROMIDE 50 MG/5ML IV SOLN
INTRAVENOUS | Status: AC
  Filled 2014-05-25: qty 2

## 2014-05-25 MED ORDER — VANCOMYCIN HCL IN DEXTROSE 1-5 GM/200ML-% IV SOLN
1000.0000 mg | INTRAVENOUS | Status: AC
  Administered 2014-05-25: 1000 mg via INTRAVENOUS
  Filled 2014-05-25: qty 200

## 2014-05-25 MED ORDER — DEXTROSE 5 % IV SOLN: Freq: Once | INTRAVENOUS | Status: DC

## 2014-05-25 MED ORDER — SUCCINYLCHOLINE CHLORIDE 20 MG/ML IJ SOLN
INTRAMUSCULAR | Status: AC
  Filled 2014-05-25: qty 1

## 2014-05-25 MED ORDER — SODIUM CHLORIDE 0.9 % IV SOLN: 250.0000 mL | INTRAVENOUS | Status: DC | PRN

## 2014-05-25 MED ORDER — SODIUM CHLORIDE 0.45 % IV SOLN
INTRAVENOUS | Status: DC
  Administered 2014-05-25: 18:00:00 via INTRAVENOUS
  Filled 2014-05-25: qty 100

## 2014-05-25 MED ORDER — PHENYLEPHRINE HCL 10 MG/ML IJ SOLN
20.0000 ug/min | INTRAMUSCULAR | Status: DC
  Administered 2014-05-25: 25 ug/min via INTRAVENOUS
  Filled 2014-05-25: qty 1

## 2014-05-25 MED ORDER — MIDAZOLAM HCL 2 MG/2ML IJ SOLN
INTRAMUSCULAR | Status: AC
  Administered 2014-05-25: 2 mg
  Filled 2014-05-25: qty 2

## 2014-05-25 MED ORDER — FENTANYL CITRATE 0.05 MG/ML IJ SOLN
INTRAMUSCULAR | Status: AC
  Filled 2014-05-25: qty 2

## 2014-05-25 MED ORDER — SODIUM CHLORIDE 0.9 % IV SOLN
INTRAVENOUS | Status: DC
  Administered 2014-05-26: 3.9 [IU]/h via INTRAVENOUS
  Administered 2014-05-26: 5.7 [IU]/h via INTRAVENOUS
  Filled 2014-05-25 (×2): qty 2.5

## 2014-05-25 MED ORDER — SODIUM CHLORIDE 0.9 % IV SOLN
1.0000 g | Freq: Once | INTRAVENOUS | Status: AC
  Administered 2014-05-25: 1 g via INTRAVENOUS
  Filled 2014-05-25: qty 10

## 2014-05-25 MED ORDER — PHENYLEPHRINE HCL 10 MG/ML IJ SOLN
20.0000 ug/min | INTRAVENOUS | Status: DC
  Administered 2014-05-25: 100 ug/min via INTRAVENOUS
  Filled 2014-05-25 (×2): qty 4

## 2014-05-25 MED ORDER — DEXTROSE-NACL 5-0.45 % IV SOLN
INTRAVENOUS | Status: DC
  Administered 2014-05-26: 125 mL/h via INTRAVENOUS

## 2014-05-25 MED ORDER — SODIUM CHLORIDE 0.9 % IV SOLN: INTRAVENOUS | Status: DC

## 2014-05-25 MED ORDER — SODIUM CHLORIDE 0.9 % IV BOLUS (SEPSIS)
1000.0000 mL | INTRAVENOUS | Status: AC
  Administered 2014-05-25 (×2): 1000 mL via INTRAVENOUS

## 2014-05-25 MED ORDER — SODIUM CHLORIDE 0.9 % IV SOLN
INTRAVENOUS | Status: DC
  Administered 2014-05-25: 5.4 [IU]/h via INTRAVENOUS
  Filled 2014-05-25: qty 2.5

## 2014-05-25 MED ORDER — SODIUM BICARBONATE 8.4 % IV SOLN
50.0000 meq | Freq: Once | INTRAVENOUS | Status: AC
  Administered 2014-05-25: 50 meq via INTRAVENOUS
  Filled 2014-05-25: qty 50

## 2014-05-25 MED ORDER — VANCOMYCIN HCL 500 MG IV SOLR
500.0000 mg | INTRAVENOUS | Status: DC
  Filled 2014-05-25: qty 500

## 2014-05-25 MED ORDER — SODIUM CHLORIDE 0.9 % IV SOLN
INTRAVENOUS | Status: AC
Start: 2014-05-25 — End: 2014-05-25
  Administered 2014-05-25: 18:00:00 via INTRAVENOUS

## 2014-05-25 MED ORDER — DEXTROSE 50 % IV SOLN: 25.0000 mL | INTRAVENOUS | Status: DC | PRN

## 2014-05-25 MED ORDER — DEXTROSE 5 % IV SOLN
1.0000 g | INTRAVENOUS | Status: DC
  Filled 2014-05-25: qty 1

## 2014-05-25 MED ORDER — DEXTROSE 5 % IV SOLN
1.0000 g | INTRAVENOUS | Status: AC
  Administered 2014-05-25: 1 g via INTRAVENOUS
  Filled 2014-05-25: qty 1

## 2014-05-25 MED ORDER — SODIUM CHLORIDE 0.45 % IV SOLN
INTRAVENOUS | Status: DC
  Filled 2014-05-25: qty 100

## 2014-05-25 MED ORDER — HEPARIN SODIUM (PORCINE) 1000 UNIT/ML IJ SOLN
3000.0000 [IU] | Freq: Once | INTRAMUSCULAR | Status: AC
  Administered 2014-05-26: 3000 [IU] via INTRAVENOUS
  Filled 2014-05-25: qty 3

## 2014-05-25 MED ORDER — ASPIRIN 300 MG RE SUPP: 300.0000 mg | RECTAL | Status: AC

## 2014-05-25 MED ORDER — ETOMIDATE 2 MG/ML IV SOLN
INTRAVENOUS | Status: AC
  Filled 2014-05-25: qty 20

## 2014-05-25 NOTE — ED Notes (Signed)
Unable to get IV access.  Attempting multiple times.  Transferring pt to res A.  MD notified.  Consulting civil engineerCharge RN in room.

## 2014-05-25 NOTE — ED Notes (Signed)
MD at bedside. 

## 2014-05-25 NOTE — Progress Notes (Signed)
  CARE MANAGEMENT ED NOTE 05/25/2014  Patient:  [name],[name]   Account Number:  [id number]  Date Initiated:  05/25/2014  Documentation initiated by:  Radford PaxFERRERO,Avonte Sensabaugh  Subjective/Objective Assessment:   Patient presents o Ed with confusion and blood sugar over 500.     Subjective/Objective Assessment Detail:   Patient admitted and discharged for DKA from 10/30 to 11/6. Patient with pmhx of CAD, HTN, hypothyroidism, DM type 1, anemia, hyperlipidemia.     Action/Plan:   Action/Plan Detail:   Anticipated DC Date:       Status Recommendation to Physician:   Result of Recommendation:    Other ED Services  Consult Working Plan    DC [employer]  Other    Choice offered to / List presented to:            Status of service:  Completed, signed off  ED Comments:   ED Comments Detail:  Patient currently with home health services of [facility]. EDCM called Tamala BariMary Manley of [facility] to make her aware of patient's admission at 1621pm.

## 2014-05-25 NOTE — Procedures (Signed)
Central Venous Catheter Insertion Procedure Note [name] DistanceLenora Forsberg [id number] 09-Sep-1965  Procedure: Insertion of Central Venous Catheter Indications: Assessment of intravascular volume, Drug and/or fluid administration, Frequent blood sampling and possibel; need hd, emergent  Procedure Details Consent: Unable to obtain consent because of emergent medical necessity. Time Out: Verified patient identification, verified procedure, site/side was marked, verified correct patient position, special equipment/implants available, medications/allergies/relevent history reviewed, required imaging and test results available.  Performed  Maximum sterile technique was used including antiseptics, cap, gloves, gown, hand hygiene, mask and sheet. Skin prep: Chlorhexidine; local anesthetic administered A antimicrobial bonded/coated triple lumen catheter was placed in the left internal jugular vein using the Seldinger technique.  Evaluation Blood flow good Complications: No apparent complications Patient did tolerate procedure well. Chest X-ray ordered to verify placement.  CXR: pending.  Nelda BucksFEINSTEIN,DANIEL J. 05/25/2014, 5:24 PM  No 20 cm available and emergent need access  US  Mcarthur RossettiDaniel J. Tyson AliasFeinstein, MD, FACP Pgr: [phone] [facility]

## 2014-05-25 NOTE — ED Notes (Signed)
Bed: WA11 Expected date:  Expected time:  Means of arrival:  Comments: EMS hyperglycemia 

## 2014-05-25 NOTE — ED Notes (Signed)
[facility]

## 2014-05-25 NOTE — ED Notes (Signed)
MD at bedside. To reevaluate this pt
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
## 2014-05-25 NOTE — ED Notes (Signed)
Per EMS, pt from Mom's house.  Pt was recently in hospital for DKA on 10/30.  Pt was d/c home.  Pt now with CBG >500.  Some confusion noted.  B/p  100//60, hr 110. resp 22.  sats 96%

## 2014-05-25 NOTE — Progress Notes (Signed)
CRITICAL VALUE ALERT  Critical value received:  783 glucose  Date of notification: 05/25/14  Time of notification:  2230  Critical value read back:YES  Nurse who received alert:  Lynita LombardJennifer D. Ephriam Knuckleslifton, RN  MD notified (1st page):  Dr. Berton LanByrum/ELINK  Time of first page:  2230  MD notified (2nd page):  Time of second page:  Responding MD:  Dr. Berton LanByrum/ELINK  Time MD responded:  2230

## 2014-05-25 NOTE — Progress Notes (Signed)
CRITICAL VALUE ALERT  Critical value received:  CO2 10  Date of notification:  05/25/14  Time of notification:  2230  Critical value read back:Yes  Nurse who received alert: Eddie NorthJennifer Jalei Shibley  MD notified (1st page):  Dr. Berton LanByrum ELINK  Time of first page:  2230  MD notified (2nd page):  Time of second page:  Responding MD:  Dr. Berton LanByrum ELINK  Time MD responded:  2230

## 2014-05-25 NOTE — H&P (Signed)
PULMONARY / CRITICAL CARE MEDICINE   Name: [name] MRN: [id number] DOB: 1966/02/03    ADMISSION DATE:  05/25/2014 CONSULTATION DATE:  05/25/14  REFERRING MD :  EDP  CHIEF COMPLAINT:  Change in MS  INITIAL PRESENTATION: 48 yr old recent admission NSTEMI, h/o DKA presents with change in MS,. Low BP, concern DKA  STUDIES:  11/9 ct head>>>  SIGNIFICANT VENTS: 11/9- change in MS  HISTORY OF PRESENT ILLNESS:  48 yr old just dc for dka, NSTEMI, cath neg cad, presents with chang ein MS from MOMS house. IN ED [facility] some drop in BP, volume given. Glu greater 500. No other history available.  PAST MEDICAL HISTORY :   has a past medical history of Microcytic anemia; History of hypothyroidism; Mental disorder; Hyperlipidemia; CAD (coronary artery disease); TB lung, latent (Dx 2008); Substance abuse; Hypertension; History of syphilis; Esophagitis, acute (05/21/2012); Tracheostomy dependence (08/19/2012); Encephalopathy, unspecified (5/13); and Diabetes mellitus type 1, uncontrolled, insulin dependent (06/22/2006).  has past surgical history that includes Appendectomy; Eye surgery; Direct laryngoscopy (05/21/2012); esophagoscopy (05/21/2012); and Tracheostomy tube placement (05/21/2012). Prior to Admission medications   Medication Sig Start Date End Date Taking? Authorizing Provider  acidophilus (RISAQUAD) CAPS capsule Take 1 capsule by mouth daily.   Yes Historical Provider, MD  aspirin EC 81 MG EC tablet Take 1 tablet (81 mg total) by mouth daily. 05/22/14  Yes Courtney ParisEden W Jones, MD  atorvastatin (LIPITOR) 80 MG tablet Take 1 tablet (80 mg total) by mouth daily at 6 PM. 05/22/14  Yes Courtney ParisEden W Jones, MD  clonazePAM (KLONOPIN) 0.5 MG tablet Take 0.5 mg by mouth 2 (two) times daily.    Yes Historical Provider, MD  famotidine (PEPCID) 20 MG tablet Take 20 mg by mouth 2 (two) times daily.   Yes Historical Provider, MD  insulin aspart (NOVOLOG) 100 UNIT/ML injection Inject 0-12 Units into the skin 3 (three) times  daily before meals. Patient uses sliding scale   Yes Historical Provider, MD  insulin glargine (LANTUS) 100 UNIT/ML injection Inject 0.12 mLs (12 Units total) into the skin 2 (two) times daily. 05/22/14  Yes Courtney ParisEden W Jones, MD  losartan (COZAAR) 25 MG tablet Take 1 tablet (25 mg total) by mouth daily. 05/22/14  Yes Courtney ParisEden W Jones, MD  metoprolol tartrate (LOPRESSOR) 25 MG tablet Take 0.5 tablets (12.5 mg total) by mouth 2 (two) times daily. 05/22/14  Yes Courtney ParisEden W Jones, MD  Multiple Vitamin (MULTIVITAMIN WITH MINERALS) TABS tablet Take 1 tablet by mouth daily. 02/05/14  Yes Cathlean CowerVijaya Prakash Boggala, MD  QUEtiapine (SEROQUEL) 50 MG tablet Take 50 mg by mouth daily.   Yes Historical Provider, MD  albuterol (PROVENTIL HFA;VENTOLIN HFA) 108 (90 BASE) MCG/ACT inhaler Inhale 2 puffs into the lungs every 4 (four) hours as needed for wheezing or shortness of breath. 02/05/14   Cathlean CowerVijaya Prakash Boggala, MD  albuterol (PROVENTIL) (2.5 MG/3ML) 0.083% nebulizer solution Take 3 mLs (2.5 mg total) by nebulization every 6 (six) hours as needed for wheezing. 12/06/12   Burns SpainElizabeth A Butcher, MD  dextrose (GLUTOSE) 40 % GEL Take 37.5 g by mouth as needed (low blood sugar). 02/05/14   Cathlean CowerVijaya Prakash Boggala, MD  ferrous sulfate 325 (65 FE) MG tablet Take 325 mg by mouth 2 (two) times daily with a meal.    Historical Provider, MD  glucose blood (ACCU-CHEK SMARTVIEW) test strip 1 each by Other route 6 (six) times daily. Test at least 6 times daily anytime you feel your sugar is low. 250.03. Poorly controlled  DM with freq and severe hypoglycemia 02/05/14   Cathlean Cower, MD  naproxen (NAPROSYN) 500 MG tablet Take 1 tablet (500 mg total) by mouth 2 (two) times daily. 02/05/14   Cathlean Cower, MD  sertraline (ZOLOFT) 100 MG tablet Take 100 mg by mouth at bedtime.    Historical Provider, MD  vitamin C (ASCORBIC ACID) 500 MG tablet Take 500 mg by mouth daily.    Historical Provider, MD   No Known Allergies  FAMILY HISTORY:   indicated that her mother is alive. She indicated that her father is deceased. She indicated that her brother is deceased. She indicated that all of her three daughters are alive.  SOCIAL HISTORY:  reports that she quit smoking about 12 years ago. She has never used smokeless tobacco. She reports that she does not drink alcohol or use illicit drugs.  REVIEW OF SYSTEMS: unable, pt confused  SUBJECTIVE: confusion  VITAL SIGNS: Temp:  [94.8 F (34.9 C)-95.5 F (35.3 C)] 95.5 F (35.3 C) (11/09 1545) Pulse Rate:  [85-93] 85 (11/09 1545) Resp:  [19-22] 21 (11/09 1545) BP: (99-114)/(37-45) 99/40 mmHg (11/09 1545) SpO2:  [100 %] 100 % (11/09 1545) HEMODYNAMICS:   VENTILATOR SETTINGS:   INTAKE / OUTPUT:  Intake/Output Summary (Last 24 hours) at 05/25/14 1617 Last data filed at 05/25/14 1547  Gross per 24 hour  Intake   2000 ml  Output    400 ml  Net   1600 ml    PHYSICAL EXAMINATION: General:  Agonal, distress Neuro:  Moves all ext equal, perr 3 mm, confused HEENT:  Trach wnl Cardiovascular:  s1s 2RRT no r Lungs:  ronchi mild Abdomen:  Soft, BS hypo, no r/g Musculoskeletal:  No edema  LABS:  CBC  Recent Labs Lab 05/21/14 0500 05/22/14 0501 05/25/14 1454 05/25/14 1520  WBC 5.4 4.6 25.0*  --   HGB 8.9* 9.1* 8.8* 12.6  HCT 28.1* 28.8* 31.6* 37.0  PLT 192 185 433*  --    Coag's No results for input(s): APTT, INR in the last 168 hours. BMET  Recent Labs Lab 05/21/14 0500 05/22/14 1225 05/25/14 1454 05/25/14 1520  NA 141 132* PENDING 123*  K 3.8 3.9 PENDING 8.0*  CL 101 94* PENDING 97  CO2 26 27 PENDING  --   BUN 23 13 47* 42*  CREATININE 0.61 0.55 2.04* 2.10*  GLUCOSE 273* 192* PENDING >700*   Electrolytes  Recent Labs Lab 05/21/14 0500 05/22/14 1225 05/25/14 1454  CALCIUM 8.6 8.4 9.3   Sepsis Markers  Recent Labs Lab 05/25/14 1521  LATICACIDVEN 8.15*   ABG  Recent Labs Lab 05/19/14 1543  PHART 7.433  PCO2ART 38.2  PO2ART 118.0*    Liver Enzymes  Recent Labs Lab 05/25/14 1454  AST 15  ALT 16  ALKPHOS 105  BILITOT <0.2*  ALBUMIN 3.8   Cardiac Enzymes No results for input(s): TROPONINI, PROBNP in the last 168 hours. Glucose  Recent Labs Lab 05/21/14 1654 05/21/14 2146 05/22/14 0759 05/22/14 1149 05/25/14 1336 05/25/14 1458  GLUCAP 194* 155* 197* 221* >600* >600*    Imaging No results found.   ASSESSMENT / PLAN:  PULMONARY Trach chronic>>> A: Uncompensated Metabolic Acidosis P:   Change trach to cuff Ventilate with guaranteed MV pcxr needed, pending still abg follow up, first draw is venous Correct acidosis  CARDIOVASCULAR A: Borderline BP, secondary to hypovolemia, acidosis affect, r/o septic shock, recent NSTEMI cath neg, Profound hyperkalemia changes on ECG, known cardiomyopathy EF 25% P:  Volume resuscitate  May need line Lactic acid clearance ecg Correct PH STAT See renal cortisol  RENAL A:  ARF, hypovolemia, hyperkalemia, hyponatremia P:   Bicarb for K only for now bmet q2h Saline Need access stat Ensure calcium, insulin given in addition  GASTROINTESTINAL A:  NPO P:   Assess for shock liver ppi required npo  HEMATOLOGIC A: Leukocytosis, r/o HCAP ( no pcxr yet), r/o additional source P:  See ID scd Add sub q hep, if coags wnl  INFECTIOUS A:  R/o severe sepsis vs hypovolemia, r/o hcap P:   BCx2 >>> UC not needed UA neg Sputum may be needed await pcxr Abx: Cefepime 10/9>>> vanc 10/9>>  ENDOCRINE A:  R/o rel AI, DKA, HONK P:   A ton of labs pending, will control glu for now with insulin drip cortisol  NEUROLOGIC A:  Chang eiN MS, likely metabolic enceph P:   RASS goal: 0 Ct head done awaited Correct PH   FAMILY  - Updates: none here  - Inter-disciplinary family meet or Palliative Care meeting due by: 11/16    TODAY'S SUMMARY: DKA, hypovolemia, r/o septic shock source, a ton of labs pending, to [facility] for [facility]f, hyper K   Ccm time 45 min    Mcarthur Rossettianiel J. Tyson AliasFeinstein, MD, FACP Pgr: [phone] [facility]  Pulmonary and Critical Care Medicine [facility] Pager: [phone]  05/25/2014, 4:17 PM

## 2014-05-25 NOTE — ED Notes (Signed)
ADMITTING MD AT BS EVALUATING PT  

## 2014-05-25 NOTE — ED Notes (Signed)
2 LITERS INFUSING AT PRESENT

## 2014-05-26 ENCOUNTER — Ambulatory Visit: Payer: Medicaid Other | Admitting: Internal Medicine

## 2014-05-26 DIAGNOSIS — E1011 Type 1 diabetes mellitus with ketoacidosis with coma: Secondary | ICD-10-CM

## 2014-05-26 DIAGNOSIS — J9601 Acute respiratory failure with hypoxia: Secondary | ICD-10-CM

## 2014-05-26 DIAGNOSIS — G9340 Encephalopathy, unspecified: Secondary | ICD-10-CM

## 2014-05-26 LAB — POCT I-STAT 3, ART BLOOD GAS (G3+)
ACID-BASE DEFICIT: 2 mmol/L (ref 0.0–2.0)
Acid-base deficit: 1 mmol/L (ref 0.0–2.0)
Bicarbonate: 19.8 mEq/L — ABNORMAL LOW (ref 20.0–24.0)
Bicarbonate: 20.2 mEq/L (ref 20.0–24.0)
O2 Saturation: 100 %
O2 Saturation: 100 %
PH ART: 7.618 — AB (ref 7.350–7.450)
Patient temperature: 98.7
Patient temperature: 98.6
TCO2: 20 mmol/L (ref 0–100)
TCO2: 21 mmol/L (ref 0–100)
pCO2 arterial: 19.4 mmHg — CL (ref 35.0–45.0)
pCO2 arterial: 24.3 mmHg — ABNORMAL LOW (ref 35.0–45.0)
pH, Arterial: 7.528 — ABNORMAL HIGH (ref 7.350–7.450)
pO2, Arterial: 213 mmHg — ABNORMAL HIGH (ref 80.0–100.0)
pO2, Arterial: 144 mmHg — ABNORMAL HIGH (ref 80.0–100.0)

## 2014-05-26 LAB — URINALYSIS, ROUTINE W REFLEX MICROSCOPIC
Bilirubin Urine: NEGATIVE
Hgb urine dipstick: NEGATIVE
Ketones, ur: >80 mg/dL — AB
Nitrite: NEGATIVE
Protein, ur: 30 mg/dL — AB
SPECIFIC GRAVITY, URINE: 1.025 (ref 1.005–1.030)
Urobilinogen, UA: 0.2 mg/dL (ref 0.0–1.0)
pH: 5 (ref 5.0–8.0)

## 2014-05-26 LAB — BASIC METABOLIC PANEL
ANION GAP: 25 — AB (ref 5–15)
ANION GAP: 17 — AB (ref 5–15)
ANION GAP: 17 — AB (ref 5–15)
ANION GAP: 17 — AB (ref 5–15)
Anion gap: 20 — ABNORMAL HIGH (ref 5–15)
Anion gap: 14 (ref 5–15)
Anion gap: 15 (ref 5–15)
BUN: 37 mg/dL — ABNORMAL HIGH (ref 6–23)
BUN: 34 mg/dL — ABNORMAL HIGH (ref 6–23)
BUN: 33 mg/dL — ABNORMAL HIGH (ref 6–23)
BUN: 32 mg/dL — ABNORMAL HIGH (ref 6–23)
BUN: 32 mg/dL — ABNORMAL HIGH (ref 6–23)
BUN: 31 mg/dL — AB (ref 6–23)
BUN: 30 mg/dL — ABNORMAL HIGH (ref 6–23)
CALCIUM: 8.6 mg/dL (ref 8.4–10.5)
CALCIUM: 8.8 mg/dL (ref 8.4–10.5)
CALCIUM: 8.6 mg/dL (ref 8.4–10.5)
CHLORIDE: 110 meq/L (ref 96–112)
CHLORIDE: 111 meq/L (ref 96–112)
CO2: 13 meq/L — AB (ref 19–32)
CO2: 16 meq/L — AB (ref 19–32)
CO2: 18 mEq/L — ABNORMAL LOW (ref 19–32)
CO2: 18 mEq/L — ABNORMAL LOW (ref 19–32)
CO2: 18 mEq/L — ABNORMAL LOW (ref 19–32)
CO2: 20 mEq/L (ref 19–32)
CO2: 18 mEq/L — ABNORMAL LOW (ref 19–32)
CREATININE: 1.35 mg/dL — AB (ref 0.50–1.10)
CREATININE: 1.07 mg/dL (ref 0.50–1.10)
CREATININE: 1 mg/dL (ref 0.50–1.10)
CREATININE: 0.93 mg/dL (ref 0.50–1.10)
Calcium: 8.7 mg/dL (ref 8.4–10.5)
Calcium: 8.7 mg/dL (ref 8.4–10.5)
Calcium: 8.2 mg/dL — ABNORMAL LOW (ref 8.4–10.5)
Calcium: 7.9 mg/dL — ABNORMAL LOW (ref 8.4–10.5)
Chloride: 102 mEq/L (ref 96–112)
Chloride: 106 mEq/L (ref 96–112)
Chloride: 109 mEq/L (ref 96–112)
Chloride: 110 mEq/L (ref 96–112)
Chloride: 109 mEq/L (ref 96–112)
Creatinine, Ser: 1.56 mg/dL — ABNORMAL HIGH (ref 0.50–1.10)
Creatinine, Ser: 1.21 mg/dL — ABNORMAL HIGH (ref 0.50–1.10)
Creatinine, Ser: 1.12 mg/dL — ABNORMAL HIGH (ref 0.50–1.10)
GFR calc Af Amer: 60 mL/min — ABNORMAL LOW (ref >90)
GFR calc Af Amer: 66 mL/min — ABNORMAL LOW (ref >90)
GFR calc Af Amer: 69 mL/min — ABNORMAL LOW (ref >90)
GFR calc non Af Amer: 45 mL/min — ABNORMAL LOW (ref >90)
GFR calc non Af Amer: 57 mL/min — ABNORMAL LOW (ref >90)
GFR calc non Af Amer: 60 mL/min — ABNORMAL LOW (ref >90)
GFR calc non Af Amer: 65 mL/min — ABNORMAL LOW (ref >90)
GFR calc non Af Amer: 71 mL/min — ABNORMAL LOW (ref >90)
GFR, EST AFRICAN AMERICAN: 44 mL/min — AB (ref >90)
GFR, EST AFRICAN AMERICAN: 52 mL/min — AB (ref >90)
GFR, EST AFRICAN AMERICAN: 75 mL/min — AB (ref >90)
GFR, EST AFRICAN AMERICAN: 82 mL/min — AB (ref >90)
GFR, EST NON AFRICAN AMERICAN: 38 mL/min — AB (ref >90)
GFR, EST NON AFRICAN AMERICAN: 52 mL/min — AB (ref >90)
GLUCOSE: 151 mg/dL — AB (ref 70–99)
GLUCOSE: 138 mg/dL — AB (ref 70–99)
Glucose, Bld: 604 mg/dL (ref 70–99)
Glucose, Bld: 411 mg/dL — ABNORMAL HIGH (ref 70–99)
Glucose, Bld: 286 mg/dL — ABNORMAL HIGH (ref 70–99)
Glucose, Bld: 219 mg/dL — ABNORMAL HIGH (ref 70–99)
Glucose, Bld: 173 mg/dL — ABNORMAL HIGH (ref 70–99)
POTASSIUM: 3.1 meq/L — AB (ref 3.7–5.3)
Potassium: 4 mEq/L (ref 3.7–5.3)
Potassium: 3.5 mEq/L — ABNORMAL LOW (ref 3.7–5.3)
Potassium: 3.1 mEq/L — ABNORMAL LOW (ref 3.7–5.3)
Potassium: 3 mEq/L — ABNORMAL LOW (ref 3.7–5.3)
Potassium: 3.3 mEq/L — ABNORMAL LOW (ref 3.7–5.3)
Potassium: 4 mEq/L (ref 3.7–5.3)
SODIUM: 140 meq/L (ref 137–147)
SODIUM: 144 meq/L (ref 137–147)
SODIUM: 145 meq/L (ref 137–147)
Sodium: 142 mEq/L (ref 137–147)
Sodium: 145 mEq/L (ref 137–147)
Sodium: 145 mEq/L (ref 137–147)
Sodium: 142 mEq/L (ref 137–147)

## 2014-05-26 LAB — CBC
HEMATOCRIT: 23.1 % — AB (ref 36.0–46.0)
Hemoglobin: 7.6 g/dL — ABNORMAL LOW (ref 12.0–15.0)
MCH: 19.6 pg — ABNORMAL LOW (ref 26.0–34.0)
MCHC: 32.9 g/dL (ref 30.0–36.0)
MCV: 59.5 fL — ABNORMAL LOW (ref 78.0–100.0)
PLATELETS: 300 10*3/uL (ref 150–400)
RBC: 3.88 MIL/uL (ref 3.87–5.11)
RDW: 18.4 % — ABNORMAL HIGH (ref 11.5–15.5)
WBC: 15.2 10*3/uL — AB (ref 4.0–10.5)

## 2014-05-26 LAB — COMPREHENSIVE METABOLIC PANEL
ALT: 16 U/L (ref 0–35)
AST: 15 U/L (ref 0–37)
Albumin: 3.8 g/dL (ref 3.5–5.2)
Alkaline Phosphatase: 105 U/L (ref 39–117)
BUN: 47 mg/dL — ABNORMAL HIGH (ref 6–23)
CHLORIDE: 76 meq/L — AB (ref 96–112)
CREATININE: 2.04 mg/dL — AB (ref 0.50–1.10)
Calcium: 9.3 mg/dL (ref 8.4–10.5)
GFR, EST AFRICAN AMERICAN: 32 mL/min — AB (ref >90)
GFR, EST NON AFRICAN AMERICAN: 27 mL/min — AB (ref >90)
GLUCOSE: 1209 mg/dL — AB (ref 70–99)
Potassium: >7.7 mEq/L (ref 3.7–5.3)
Sodium: 124 mEq/L — ABNORMAL LOW (ref 137–147)
Total Protein: 7.8 g/dL (ref 6.0–8.3)

## 2014-05-26 LAB — URINE MICROSCOPIC-ADD ON

## 2014-05-26 LAB — GLUCOSE, CAPILLARY
GLUCOSE-CAPILLARY: 379 mg/dL — AB (ref 70–99)
GLUCOSE-CAPILLARY: 320 mg/dL — AB (ref 70–99)
GLUCOSE-CAPILLARY: 177 mg/dL — AB (ref 70–99)
GLUCOSE-CAPILLARY: 140 mg/dL — AB (ref 70–99)
GLUCOSE-CAPILLARY: 131 mg/dL — AB (ref 70–99)
GLUCOSE-CAPILLARY: 136 mg/dL — AB (ref 70–99)
GLUCOSE-CAPILLARY: 138 mg/dL — AB (ref 70–99)
GLUCOSE-CAPILLARY: 92 mg/dL (ref 70–99)
Glucose-Capillary: 210 mg/dL — ABNORMAL HIGH (ref 70–99)
Glucose-Capillary: 266 mg/dL — ABNORMAL HIGH (ref 70–99)
Glucose-Capillary: 441 mg/dL — ABNORMAL HIGH (ref 70–99)
Glucose-Capillary: 496 mg/dL — ABNORMAL HIGH (ref 70–99)
Glucose-Capillary: 532 mg/dL — ABNORMAL HIGH (ref 70–99)
Glucose-Capillary: >600 mg/dL (ref 70–99)
Glucose-Capillary: 127 mg/dL — ABNORMAL HIGH (ref 70–99)
Glucose-Capillary: 130 mg/dL — ABNORMAL HIGH (ref 70–99)
Glucose-Capillary: 137 mg/dL — ABNORMAL HIGH (ref 70–99)
Glucose-Capillary: 145 mg/dL — ABNORMAL HIGH (ref 70–99)
Glucose-Capillary: 171 mg/dL — ABNORMAL HIGH (ref 70–99)
Glucose-Capillary: 184 mg/dL — ABNORMAL HIGH (ref 70–99)

## 2014-05-26 LAB — CORTISOL: Cortisol, Plasma: 51.3 ug/dL

## 2014-05-26 LAB — POTASSIUM
Potassium: 3.7 mEq/L (ref 3.7–5.3)
Potassium: 3.3 mEq/L — ABNORMAL LOW (ref 3.7–5.3)

## 2014-05-26 LAB — PHOSPHORUS: PHOSPHORUS: 0.6 mg/dL — AB (ref 2.3–4.6)

## 2014-05-26 LAB — MAGNESIUM: MAGNESIUM: 1.8 mg/dL (ref 1.5–2.5)

## 2014-05-26 MED ORDER — CHLORHEXIDINE GLUCONATE CLOTH 2 % EX PADS
6.0000 | MEDICATED_PAD | Freq: Every day | CUTANEOUS | Status: DC
  Administered 2014-05-26 – 2014-05-29 (×4): 6 via TOPICAL

## 2014-05-26 MED ORDER — CHLORHEXIDINE GLUCONATE 0.12 % MT SOLN
15.0000 mL | Freq: Two times a day (BID) | OROMUCOSAL | Status: DC
  Administered 2014-05-26 – 2014-05-27 (×3): 15 mL via OROMUCOSAL
  Filled 2014-05-26 (×7): qty 15

## 2014-05-26 MED ORDER — INSULIN GLARGINE 100 UNIT/ML ~~LOC~~ SOLN
20.0000 [IU] | SUBCUTANEOUS | Status: DC
  Administered 2014-05-26 – 2014-05-29 (×4): 20 [IU] via SUBCUTANEOUS
  Filled 2014-05-26 (×4): qty 0.2

## 2014-05-26 MED ORDER — INSULIN ASPART 100 UNIT/ML ~~LOC~~ SOLN
0.0000 [IU] | Freq: Three times a day (TID) | SUBCUTANEOUS | Status: DC
  Administered 2014-05-26: 2 [IU] via SUBCUTANEOUS
  Administered 2014-05-27 (×2): 5 [IU] via SUBCUTANEOUS
  Administered 2014-05-28 (×2): 3 [IU] via SUBCUTANEOUS
  Administered 2014-05-28: 5 [IU] via SUBCUTANEOUS
  Administered 2014-05-29: 7 [IU] via SUBCUTANEOUS
  Administered 2014-05-29: 2 [IU] via SUBCUTANEOUS

## 2014-05-26 MED ORDER — MUPIROCIN 2 % EX OINT
1.0000 "application " | TOPICAL_OINTMENT | Freq: Two times a day (BID) | CUTANEOUS | Status: DC
  Administered 2014-05-26 – 2014-05-29 (×7): 1 via NASAL
  Filled 2014-05-26 (×2): qty 22

## 2014-05-26 MED ORDER — DEXTROSE 10 % IV SOLN: INTRAVENOUS | Status: DC | PRN

## 2014-05-26 MED ORDER — POTASSIUM CHLORIDE 10 MEQ/100ML IV SOLN
10.0000 meq | INTRAVENOUS | Status: DC
  Filled 2014-05-26 (×2): qty 100

## 2014-05-26 MED ORDER — POTASSIUM PHOSPHATES 15 MMOLE/5ML IV SOLN
30.0000 mmol | Freq: Once | INTRAVENOUS | Status: AC
  Administered 2014-05-26: 30 mmol via INTRAVENOUS
  Filled 2014-05-26: qty 10

## 2014-05-26 MED ORDER — CETYLPYRIDINIUM CHLORIDE 0.05 % MT LIQD
7.0000 mL | Freq: Four times a day (QID) | OROMUCOSAL | Status: DC
  Administered 2014-05-26 – 2014-05-27 (×5): 7 mL via OROMUCOSAL

## 2014-05-26 NOTE — Progress Notes (Addendum)
PULMONARY / CRITICAL CARE MEDICINE   Name: [name] MRN: [id number] DOB: 19-Feb-1966    ADMISSION DATE:  05/25/2014 CONSULTATION DATE:  05/25/14  REFERRING MD :  EDP  CHIEF COMPLAINT:  Change in MS   INITIAL PRESENTATION: 48 yr old recent admission NSTEMI, h/o DKA presents with change in MS,. Low BP, concern DKA.  STUDIES:  11/9:  CT head>> neg   SIGNIFICANT EVENTS: 11/9: change in MS   SUBJECTIVE:   11/10:   VITAL SIGNS: Temp:  [94.8 F (34.9 C)-99.2 F (37.3 C)] 98.5 F (36.9 C) (11/10 0645) Pulse Rate:  [67-104] 104 (11/10 0645) Resp:  [19-30] 22 (11/10 0645) BP: (90-146)/(37-78) 124/64 mmHg (11/10 0645) SpO2:  [100 %] 100 % (11/10 0645) FiO2 (%):  [40 %] 40 % (11/10 0500) Weight:  [110 lb 3.7 oz (50 kg)-115 lb 1.3 oz (52.2 kg)] 115 lb 1.3 oz (52.2 kg) (11/10 0500) HEMODYNAMICS:   VENTILATOR SETTINGS: Vent Mode:  [-] PRVC FiO2 (%):  [40 %] 40 % Set Rate:  [20 bmp-30 bmp] 20 bmp Vt Set:  [470 mL] 470 mL PEEP:  [5 cmH20] 5 cmH20 Plateau Pressure:  [20 cmH20-23 cmH20] 23 cmH20 INTAKE / OUTPUT:  Intake/Output Summary (Last 24 hours) at 05/26/14 0715 Last data filed at 05/26/14 0600  Gross per 24 hour  Intake 8092.47 ml  Output   1470 ml  Net 6622.47 ml    PHYSICAL EXAMINATION: General: Agonal, distress Neuro: Moves all ext equal, perr 3 mm, confused HEENT: Trach wnl Cardiovascular: s1s 2RRT no r Lungs: ronchi mild Abdomen: Soft, BS hypo, no r/g Musculoskeletal: No edema  LABS:  CBC  Recent Labs Lab 05/25/14 1454 05/25/14 1520 05/25/14 1703 05/26/14 0515  WBC 25.0*  --  25.4* 15.2*  HGB 8.8* 12.6 7.3* 7.6*  HCT 31.6* 37.0 24.7* 23.1*  PLT 433*  --  350 300   Coag's  Recent Labs Lab 05/25/14 1703  APTT 24  INR 1.56*   BMET  Recent Labs Lab 05/26/14 0210 05/26/14 0400 05/26/14 0515  NA 142 144 145  K 3.5* 3.1* 3.1*  CL 106 109 110  CO2 16* 18* 18*  BUN 34* 33* 32*  CREATININE 1.35* 1.21* 1.12*  GLUCOSE 411* 286*  219*   Electrolytes  Recent Labs Lab 05/26/14 0210 05/26/14 0400 05/26/14 0515  CALCIUM 8.7 8.8 8.7  MG  --   --  1.8  PHOS  --   --  0.6*   Sepsis Markers  Recent Labs Lab 05/25/14 1703 05/25/14 1727 05/25/14 2133  LATICACIDVEN 5.4* 4.91* 3.6*   ABG  Recent Labs Lab 05/25/14 1748 05/26/14 0450 05/26/14 0625  PHART 7.072* 7.618* 7.528*  PCO2ART 14.5* 19.4* 24.3*  PO2ART 77.7* 213.0* 144.0*   Liver Enzymes  Recent Labs Lab 05/25/14 1454 05/25/14 1703  AST 15 25  ALT 16 18  ALKPHOS 105 80  BILITOT <0.2* <0.2*  ALBUMIN 3.8 2.9*   Cardiac Enzymes  Recent Labs Lab 05/25/14 1703  TROPONINI <0.30   Glucose  Recent Labs Lab 05/26/14 0121 05/26/14 0215 05/26/14 0321 05/26/14 0410 05/26/14 0512 05/26/14 0617  GLUCAP 441* 379* 320* 266* 210* 177*    Imaging Dg Chest 1 View  05/25/2014   CLINICAL DATA:  Hypoglycemia  EXAM: CHEST - 1 VIEW  COMPARISON:  05/17/2014  FINDINGS: The patient was shielded for this exam given at positive pregnancy test. Cardiac shadow is within normal limits. A tracheostomy tube is again noted in satisfactory position. The lungs are well-aerated without  focal infiltrate. The previously seen changes of vascular congestion have resolved in the interval. No bony abnormality is seen.  IMPRESSION: No acute abnormality noted.   Electronically Signed   By: Alcide CleverMark  Lukens M.D.   On: 05/25/2014 16:46   Ct Head Wo Contrast  05/25/2014   CLINICAL DATA:  Altered mental status. Initial encounter. Unresponsive patient.  EXAM: CT HEAD WITHOUT CONTRAST  TECHNIQUE: Contiguous axial images were obtained from the base of the skull through the vertex without intravenous contrast.  COMPARISON:  05/15/2014.  FINDINGS: No mass lesion, mass effect, midline shift, hydrocephalus, hemorrhage. No territorial ischemia or acute infarction.  Visible paranasal sinuses are within normal limits.  IMPRESSION: Negative CT head.   Electronically Signed   By: Andreas NewportGeoffrey   Lamke M.D.   On: 05/25/2014 16:33   Dg Chest Portable 1 View  05/25/2014   CLINICAL DATA:  Evaluate central line placement.  EXAM: PORTABLE CHEST - 1 VIEW  COMPARISON:  05/25/2014 and 05/17/2014  FINDINGS: Tracheostomy tube unchanged and in adequate position. Left IJ central venous catheter has tip overlying the region of the SVC at the level of the carina. Lungs are adequately inflated without consolidation or effusion. There is no evidence of left-sided pneumothorax. Cardiomediastinal silhouette and remainder of the exam is unchanged.  IMPRESSION: No acute cardiopulmonary disease.  No left-sided pneumothorax.  Tubes and lines as described.   Electronically Signed   By: Elberta Fortisaniel  Boyle M.D.   On: 05/25/2014 17:40     ASSESSMENT / PLAN:  PULMONARY Trach Chronic>> A: P:   CXR PRN  ABG PRN   CARDIOVASCULAR  A: Hypotension - resolved  Hypokalemia  Recent NSTEMI s/p cath showing widely patient coronary arteries.  sCHF EF 25-30%  P:  Holding lipitor, lopressor 12.5 mg  Pressors (neo)  ASA  RENAL A:  Anion Gap metabolic acidosis - resolved  Respiratory alkalosis  P:   S/p Sodium Bicarb bolus   IVPB K  Transition to pressure support and off vent   GASTROINTESTINAL A:  PEG tube chronic   P:   NPO  PPI   HEMATOLOGIC A:  VTE Ppx Leukocytosis - resolved   P:  SCD's   INFECTIOUS A:  R/o HCAP  P:   BCx2 11/9 UC 11/9 UA neg  Sputum not sent  Abx: Vanc, start date 11/9>>11/10 Abx: Cefepime, start date 11/9>>11/10  ENDOCRINE A: DKA vs HONK   DM2  P:   Insulin gtt   NEUROLOGIC A:  Acute metabolic encephalopathy - resolved.  Mental d/o, Hx of ?bipolar  Hx of Polysubstance abuse  P:   Sertraline, Seroquel, PRN klonopin  Methodone was stopped on dc prior admission.  RASS goal: 0,-1 WUA  Fentanyl PRN   FAMILY  - Updates: no family at bedside.   - Inter-disciplinary family meet or Palliative Care meeting due by:    TODAY'S SUMMARY: 48 yo presenting with likely  metabolic encephalopathy with resolution of acidosis and blood sugar. Will continue insulin gtt until gap closes then transition to subQ insulin and allow to eat.  Transitioned off ventilation.    Myra RudeJeremy E Schmitz, MD PGY-2, [facility] 05/26/2014, 7:33 AM   Attending:  DKA again, no clear source of infection.  On exam she is waking up, weaning on vent  Plan: Change insulin to sub q Move to trach collar asap Let her eat  CC time by me 35 minutes  Heber CarolinaBrent McQuaid, MD  PCCM Pager: [phone] Cell: [phone] If no response, call [phone]

## 2014-05-26 NOTE — Progress Notes (Signed)
UR Completed.  Oralia Criger Jane [phone] 05/26/2014  

## 2014-05-26 NOTE — Progress Notes (Signed)
Inpatient Diabetes Program Recommendations  AACE/ADA: New Consensus Statement on Inpatient Glycemic Control (2013)  Target Ranges:  Prepandial:   less than 140 mg/dL      Peak postprandial:   less than 180 mg/dL (1-2 hours)      Critically ill patients:  140 - 180 mg/dL   Reason for Visit: DKA  Diabetes history: DM1 Outpatient Diabetes medications: Lantus 12 units bid Current orders for Inpatient glycemic control: IV insulin / GlucoStabilizer  Results for CHONTEL, WARNING (MRN [id number]) as of 05/26/2014 14:38  Ref. Range 05/26/2014 08:19 05/26/2014 11:31  Sodium Latest Range: 137-147 mEq/L 145 142  Potassium Latest Range: 3.7-5.3 mEq/L 3.3 (L) 4.0  Chloride Latest Range: 96-112 mEq/L 111 109  CO2 Latest Range: 19-32 mEq/L 20 18 (L)  BUN Latest Range: 6-23 mg/dL 31 (H) 30 (H)  Creatinine Latest Range: 0.50-1.10 mg/dL 1.00 0.93  Calcium Latest Range: 8.4-10.5 mg/dL 8.2 (L) 7.9 (L)  GFR calc non Af Amer Latest Range: >90 mL/min 65 (L) 71 (L)  GFR calc Af Amer Latest Range: >90 mL/min 75 (L) 82 (L)  Glucose Latest Range: 70-99 mg/dL 151 (H) 138 (H)  Anion gap Latest Range: 5-'15  14 15   ' Continue on GlucoStabilizer until criteria met. Give Lantus 1-2 hours prior to discontinuation of insulin drip. Will follow. Thank you. Lorenda Peck, RD, LDN, CDE Inpatient Diabetes Coordinator [phone]

## 2014-05-26 NOTE — Progress Notes (Signed)
[facility] FOR [facility] LAB REPLACEMENT ONLY  The patient does apply [facility] the [facility]ly[facility] based on the criteria listed below:   1. Is GFR >/= 40 ml/min? Yes.    Patient's GFR today is 52 2. Is urine output >/= 0.5 ml/kg/hr for the last 6 hours? Yes.   Patient's UOP is .8 ml/kg/hr 3. Is BUN < 60 mg/dL? Yes.    Patient's BUN today is 33 4. Abnormal electrolyte(s): K 3.1 5. Ordered repletion with: per protocol 6. If a panic level lab has been reported, has the CCM MD in charge been notified? Yes.  .   Physician:  E Deterding  Ardelle ParkSuits, Numair Masden McEachran 05/26/2014 5:49 AM

## 2014-05-26 NOTE — Progress Notes (Signed)
eLink Physician-Brief Progress Note Patient Name: [name] DOB: 21-Sep-1965 MRN: [id number]   Date of Service  05/26/2014  HPI/Events of Note  Hypokalemia and hypophos  eICU Interventions  Potassium and phos replaced     Intervention Category Intermediate Interventions: Electrolyte abnormality - evaluation and management  DETERDING,ELIZABETH 05/26/2014, 6:16 AM
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
## 2014-05-26 NOTE — Progress Notes (Signed)
Placed patient on 40% trach collar per MD.  Sats currently 100%.  Vitals are stable.  RT will continue to monitor.

## 2014-05-26 NOTE — Care Management Note (Signed)
    Page 1 of 2   05/29/2014     12:58:07 PM CARE MANAGEMENT NOTE 05/29/2014  Patient:  [name],[name]   Account Number:  [id number]  Date Initiated:  05/26/2014  Documentation initiated by:  [facility],SARAH  Subjective/Objective Assessment:   [facility]  Readmitted with AMS - shock     Action/Plan:   Anticipated DC Date:  05/29/2014   Anticipated DC Plan:  HOME W HOME HEALTH SERVICES  In-house referral  Clinical Social Worker      DC Associate Professorlanning Services  CM consult      [facility]  Resumption Of Svcs/PTA Provider   Choice offered to / List presented to:  C-1 Patient        HH arranged  HH-1 RN  HH-2 PT  HH-3 OT  HH-5 SPEECH THERAPY      HH [facility]   Status of service:  Completed, signed off Medicare Important Message given?  NO (If response is "NO", the following Medicare IM given date fields will be blank) Date Medicare IM given:   Medicare IM given by:   Date Additional Medicare IM given:   Additional Medicare IM given by:    Discharge Disposition:  HOME W HOME HEALTH SERVICES  Per UR Regulation:  Reviewed for med. necessity/level of care/duration of stay  If discussed at Long Length of Stay Meetings, dates discussed:    Comments:  Contact [name]: Stimpson,Mary Mother [phone]  05/29/14 1250 Letha Capeeborah Nowell Sites RN, BSN [phone] [facility] who does the respiratory care , aide and social worker.  Patient states she needs glucometer , NCM informed MD to write script for meter and patient's medicaid will cover. Patient states she will need some more guaze for trach, she does know how to do her own trach care,  NCM [facility] and the Floor RN.  Patient states she will be transported home via car with mother.  [facility] notified of [facility] today.    05-26-14 2:20pm   Avie ArenasSarah Brown, RNBSN [phone] Talked with mother and patient in room.  Lives with  mother since October.  Prior to that SNF in [place].  Mother states can't keep taking care of her because she just leaves and does what she wants.  Would like her to go to SNF - patient in agreement.  Has two daughters and pateints mother states patient has told her one has POA.  Will get PT consult and SW consult for placement and sorting out if POA and who.  Updated SW. CM will continue to follow.

## 2014-05-27 DIAGNOSIS — R404 Transient alteration of awareness: Secondary | ICD-10-CM

## 2014-05-27 LAB — BASIC METABOLIC PANEL
Anion gap: 12 (ref 5–15)
BUN: 24 mg/dL — AB (ref 6–23)
CHLORIDE: 113 meq/L — AB (ref 96–112)
CO2: 21 mEq/L (ref 19–32)
CREATININE: 0.85 mg/dL (ref 0.50–1.10)
Calcium: 7.3 mg/dL — ABNORMAL LOW (ref 8.4–10.5)
GFR calc non Af Amer: 79 mL/min — ABNORMAL LOW (ref >90)
GLUCOSE: 52 mg/dL — AB (ref 70–99)
Potassium: 3.5 mEq/L — ABNORMAL LOW (ref 3.7–5.3)
Sodium: 146 mEq/L (ref 137–147)

## 2014-05-27 LAB — URINE CULTURE: Colony Count: >=100000

## 2014-05-27 LAB — CBC
HEMATOCRIT: 22.6 % — AB (ref 36.0–46.0)
HEMOGLOBIN: 7.3 g/dL — AB (ref 12.0–15.0)
MCH: 19.8 pg — AB (ref 26.0–34.0)
MCHC: 32.3 g/dL (ref 30.0–36.0)
MCV: 61.4 fL — AB (ref 78.0–100.0)
Platelets: 250 10*3/uL (ref 150–400)
RBC: 3.68 MIL/uL — ABNORMAL LOW (ref 3.87–5.11)
RDW: 18.8 % — ABNORMAL HIGH (ref 11.5–15.5)
WBC: 13.8 10*3/uL — ABNORMAL HIGH (ref 4.0–10.5)

## 2014-05-27 LAB — GLUCOSE, CAPILLARY
GLUCOSE-CAPILLARY: 174 mg/dL — AB (ref 70–99)
Glucose-Capillary: 286 mg/dL — ABNORMAL HIGH (ref 70–99)
Glucose-Capillary: 287 mg/dL — ABNORMAL HIGH (ref 70–99)
Glucose-Capillary: 395 mg/dL — ABNORMAL HIGH (ref 70–99)
Glucose-Capillary: 268 mg/dL — ABNORMAL HIGH (ref 70–99)

## 2014-05-27 MED ORDER — ALBUTEROL SULFATE (2.5 MG/3ML) 0.083% IN NEBU
INHALATION_SOLUTION | RESPIRATORY_TRACT | Status: AC
  Filled 2014-05-27: qty 3

## 2014-05-27 MED ORDER — PANTOPRAZOLE SODIUM 40 MG PO TBEC
40.0000 mg | DELAYED_RELEASE_TABLET | Freq: Every day | ORAL | Status: DC
  Administered 2014-05-27 – 2014-05-28 (×2): 40 mg via ORAL
  Filled 2014-05-27 (×2): qty 1

## 2014-05-27 MED ORDER — ALBUTEROL SULFATE (2.5 MG/3ML) 0.083% IN NEBU
2.5000 mg | INHALATION_SOLUTION | RESPIRATORY_TRACT | Status: DC | PRN
Start: 2014-05-27 — End: 2014-05-29
  Administered 2014-05-27: 2.5 mg via RESPIRATORY_TRACT

## 2014-05-27 MED ORDER — POTASSIUM CHLORIDE 10 MEQ/100ML IV SOLN
10.0000 meq | INTRAVENOUS | Status: AC
  Administered 2014-05-27 (×4): 10 meq via INTRAVENOUS
  Filled 2014-05-27: qty 100

## 2014-05-27 MED ORDER — ASPIRIN EC 81 MG PO TBEC
81.0000 mg | DELAYED_RELEASE_TABLET | Freq: Every day | ORAL | Status: DC
  Administered 2014-05-27 – 2014-05-29 (×3): 81 mg via ORAL
  Filled 2014-05-27 (×3): qty 1

## 2014-05-27 MED ORDER — ENSURE COMPLETE PO LIQD
237.0000 mL | Freq: Three times a day (TID) | ORAL | Status: DC
  Administered 2014-05-27 – 2014-05-29 (×6): 237 mL via ORAL

## 2014-05-27 MED ORDER — DEXTROSE 50 % IV SOLN
1.0000 | Freq: Once | INTRAVENOUS | Status: AC
  Filled 2014-05-27: qty 50

## 2014-05-27 MED ORDER — DEXTROSE 50 % IV SOLN
INTRAVENOUS | Status: AC
  Administered 2014-05-27: 1
  Filled 2014-05-27: qty 50

## 2014-05-27 NOTE — Progress Notes (Signed)
NURSING PROGRESS NOTE  [name] DistanceLenora Gowin [id number] Transfer Data: 05/27/2014 5:35 PM Attending Provider: Nelda Bucksaniel J Feinstein, MD WUJ:[id number]:Rivet, Carly, MD Code Status: Full  [name] DistanceLenora Riggsbee is a 48 y.o. female patient transferred from 68M  -No acute distress noted.  -No complaints of shortness of breath.  -No complaints of chest pain.     Blood pressure 124/67, pulse 99, temperature 98.6 F (37 C), temperature source Axillary, resp. rate 18, height 5\' 6"  (1.676 m), weight 54.7 kg (120 lb 9.5 oz), SpO2 95 %.   IV Fluids:  IV in place, occlusive dsg intact without redness, IV cath Left hand running NS.  Allergies:  Review of patient's allergies indicates no known allergies.  Past Medical History:   has a past medical history of Microcytic anemia; History of hypothyroidism; Mental disorder; Hyperlipidemia; CAD (coronary artery disease); TB lung, latent (Dx 2008); Substance abuse; Hypertension; History of syphilis; Esophagitis, acute (05/21/2012); Tracheostomy dependence (08/19/2012); Encephalopathy, unspecified (5/13); and Diabetes mellitus type 1, uncontrolled, insulin dependent (06/22/2006).  Past Surgical History:   has past surgical history that includes Appendectomy; Eye surgery; Direct laryngoscopy (05/21/2012); esophagoscopy (05/21/2012); and Tracheostomy tube placement (05/21/2012).  Social History:   reports that she quit smoking about 12 years ago. She has never used smokeless tobacco. She reports that she does not drink alcohol or use illicit drugs.  Skin: Intact  Patient/Family orientated to room. Information packet given to patient/family. Admission inpatient armband information verified with patient/family to include name and date of birth and placed on patient arm. Side rails up x 2, fall assessment and education completed with patient/family. Patient/family able to verbalize understanding of risk associated with falls and verbalized understanding to call for assistance before getting out of  bed. Call light within reach. Patient/family able to voice and demonstrate understanding of unit orientation instructions.    Will continue to evaluate and treat per MD orders.

## 2014-05-27 NOTE — Evaluation (Signed)
Physical Therapy Evaluation Patient Details Name: [name] MRN: [id number] DOB: [birth year] Today's Date: 05/27/2014   History of Present Illness  48 yr old recent admission NSTEMI, h/o DKA presents with change in MS,. Low BP, concern DKA  Clinical Impression  Pt admitted with generalized weakness and gait instability. Pt currently with functional limitations due to the deficits listed below (see PT Problem List). Pt ambulated 5' to chair with min A, +1 for lines and safety, pt fatigued after this.  Pt will benefit from skilled PT to increase their independence and safety with mobility to allow discharge to the venue listed below. PT will continue to follow.       Follow Up Recommendations Home health PT;Supervision for mobility/OOB    Equipment Recommendations  None recommended by PT    Recommendations for Other Services OT consult     Precautions / Restrictions Precautions Precautions: Fall Precaution Comments: trach, peg Restrictions Weight Bearing Restrictions: No      Mobility  Bed Mobility Overal bed mobility: Needs Assistance Bed Mobility: Supine to Sit     Supine to sit: Min assist     General bed mobility comments: min A to elevate trunk from bed, +1 to manage lines  Transfers Overall transfer level: Needs assistance Equipment used: 1 person hand held assist Transfers: Sit to/from Stand Sit to Stand: Min assist         General transfer comment: pt with generalized weakness, shaky in standing, min A given to steady, +1 for lines and safety. Pt fatigued very quickly, impulsive with desire to walk  to chair and sit   Ambulation/Gait Ambulation/Gait assistance: Min assist Ambulation [name] (Feet): 5 Feet Assistive device: None Gait Pattern/deviations: Step-through pattern;Decreased stride length     General Gait Details: pt fatigued with ambulation, unable to ambulate further first time up  Stairs            Wheelchair Mobility     Modified Rankin (Stroke Patients Only)       Balance Overall balance assessment: Needs assistance Sitting-balance support: Feet supported;No upper extremity supported Sitting balance-Leahy Scale: Fair     Standing balance support: Single extremity supported;During functional activity Standing balance-Leahy Scale: Fair                               Pertinent Vitals/Pain Pain Assessment: No/denies pain  VSS    Home Living Family/patient expects to be discharged to:: Private residence Living Arrangements: Parent Available Help at Discharge: Family;Available 24 hours/day Type of Home: Apartment Home Access: Stairs to enter Entrance Stairs-Rails: Left Entrance Stairs-Number of Steps: 2 Home Layout: One level Home Equipment: Walker - 2 wheels Additional Comments: pt to d/c home with mother    Prior Function Level of Independence: Needs assistance   Gait / Transfers Assistance Needed: was independent before last admission, but mother has ben helping her since then  ADL's / Homemaking Assistance Needed: mother        Hand Dominance   Dominant Hand: Right    Extremity/Trunk Assessment   Upper Extremity Assessment: Generalized weakness           Lower Extremity Assessment: Generalized weakness      Cervical / Trunk Assessment: Normal  Communication   Communication: Tracheostomy  Cognition Arousal/Alertness: Awake/alert Behavior During Therapy: WFL for tasks assessed/performed Overall Cognitive Status: No family/caregiver present to determine baseline cognitive functioning Area of Impairment: Safety/judgement  Safety/Judgement: Decreased awareness of safety     General Comments: pt impulsive with activity    General Comments      Exercises General Exercises - Lower Extremity Ankle Circles/Pumps: AROM;Both;10 reps;Supine Mini-Sqauts: AROM;Both;10 reps;Standing      Assessment/Plan    PT Assessment Patient needs continued  PT services  PT Diagnosis Difficulty walking;Generalized weakness   PT Problem List Decreased strength;Decreased activity tolerance;Decreased mobility;Decreased balance  PT Treatment Interventions Gait training;Therapeutic activities;DME instruction;Functional mobility training;Therapeutic exercise;Stair training;Patient/family education   PT Goals (Current goals can be found in the Care Plan section) Acute Rehab PT Goals Patient Stated Goal: be able to go home PT Goal Formulation: With patient Time For Goal Achievement: 06/10/14 Potential to Achieve Goals: Good    Frequency Min 3X/week   Barriers to discharge        [name]               End of Session Equipment Utilized During Treatment: Gait belt Activity Tolerance: Patient tolerated treatment well;Patient limited by fatigue Patient left: in chair;with call bell/phone within reach Nurse Communication: Mobility status         Time: 0820-0845 PT Time Calculation (min) (ACUTE ONLY): 25 min   Charges:   PT Evaluation $Initial PT Evaluation Tier I: 1 Procedure PT Treatments $Therapeutic Activity: 23-37 mins   PT G Codes:        Nicole CoVictoria Sharyah [name], PT  Acute Rehab Services  [phone]   Nicole CoManess, [name] 05/27/2014, 12:03 PM

## 2014-05-27 NOTE — Progress Notes (Signed)
Inpatient Diabetes Program Recommendations  AACE/ADA: New Consensus Statement on Inpatient Glycemic Control (2013)  Target Ranges:  Prepandial:   less than 140 mg/dL      Peak postprandial:   less than 180 mg/dL (1-2 hours)      Critically ill patients:  140 - 180 mg/dL   Reason for Visit: Hypoglycemia Results for [name], [name] (MRN [id number]) as of 05/27/2014 07:53  Ref. Range 05/27/2014 03:19  Sodium Latest Range: 137-147 mEq/L 146  Potassium Latest Range: 3.7-5.3 mEq/L 3.5 (L)  Chloride Latest Range: 96-112 mEq/L 113 (H)  CO2 Latest Range: 19-32 mEq/L 21  BUN Latest Range: 6-23 mg/dL 24 (H)  Creatinine Latest Range: 0.50-1.10 mg/dL 5.620.85  Calcium Latest Range: 8.4-10.5 mg/dL 7.3 (L)  GFR calc non Af Amer Latest Range: >90 mL/min 79 (L)  GFR calc Af Amer Latest Range: >90 mL/min >90  Glucose Latest Range: 70-99 mg/dL 52 (L)   Received 20 units of Lantus followed by transitioning off GlucoStabilizer. Hypoglycemia this morning.   Inpatient Diabetes Program Recommendations Insulin - Basal: Consider spliting Lantus dose as pt on bid at home. Lantus 8 units bid  Note: Continue to follow. Thank you. Ailene Ardshonda Clancy Mullarkey, RD, LDN, CDE Inpatient Diabetes Coordinator [phone]

## 2014-05-27 NOTE — Progress Notes (Addendum)
NUTRITION FOLLOW UP  Intervention:    Ensure Complete PO TID, each supplement provides 350 kcal and 13 grams of protein  Nutrition Dx:   Inadequate oral intake related to poor appetite as evidenced by minimal intake of meals.  Goal:   Intake to meet >90% of estimated nutrition needs.  Monitor:   PO intake, labs, weight trend.  Assessment:   48 yr old with recent admission for NSTEMI, h/o DKA; presented with change in MS, Low BP, concern for DKA.  Patient currently on trach collar. Discussed patient in ICU rounds today. Per RN, patient is eating very poorly, only drank milk from breakfast tray.Hypoglycemic episode noted. Spoke with patient who reports that she does not use her PEG at home. She was cleared for a regular diet by SLP during previous admission. Discussed increasing calorie and protein intake with supplements. Patient said that she likes Ensure supplements.   Height: Ht Readings from Last 1 Encounters:  05/25/14 5\' 6"  (1.676 m)    Weight Status:   Wt Readings from Last 1 Encounters:  05/27/14 120 lb 9.5 oz (54.7 kg)    Re-estimated needs:  Kcal: 1650-1850 Protein: 70-90 gm Fluid: 1.8 L  Skin: WDL  Diet Order: Diet Carb Modified   Intake/Output Summary (Last 24 hours) at 05/27/14 1256 Last data filed at 05/27/14 1121  Gross per 24 hour  Intake 1672.04 ml  Output    670 ml  Net 1002.04 ml    Last BM: PTA   Labs:   Recent Labs Lab 05/26/14 0515  05/26/14 0819 05/26/14 1131 05/27/14 0319  NA 145  < > 145 142 146  K 3.1*  < > 3.3*  3.3* 4.0 3.5*  CL 110  < > 111 109 113*  CO2 18*  < > 20 18* 21  BUN 32*  < > 31* 30* 24*  CREATININE 1.12*  < > 1.00 0.93 0.85  CALCIUM 8.7  < > 8.2* 7.9* 7.3*  MG 1.8  --   --   --   --   PHOS 0.6*  --   --   --   --   GLUCOSE 219*  < > 151* 138* 52*  < > = values in this interval not displayed.  CBG (last 3)   Recent Labs  05/26/14 1426 05/26/14 1615 05/26/14 2145  GLUCAP 138* 184* 92    Scheduled  Meds: . antiseptic oral rinse  7 mL Mouth Rinse QID  . chlorhexidine  15 mL Mouth Rinse BID  . Chlorhexidine Gluconate Cloth  6 each Topical Q0600  . heparin  5,000 Units Subcutaneous 3 times per day  . insulin aspart  0-9 Units Subcutaneous TID WC  . insulin glargine  20 Units Subcutaneous Q24H  . mupirocin ointment  1 application Nasal BID  . pantoprazole (PROTONIX) IV  40 mg Intravenous QHS  . potassium chloride  10 mEq Intravenous Q1 Hr x 4    Continuous Infusions: . sodium chloride 100 mL/hr (05/27/14 0127)    [name], RD, LDN, CNSC Pager [phone] After Hours Pager [phone]

## 2014-05-27 NOTE — Progress Notes (Signed)
Hypoglycemic Event   CBG: 52  Treatment: D50 IV 50 mL  Symptoms: Shaky  Follow-up CBG: Time: 0746 CBG Result:174 Possible Reasons for Event: Inadequate meal intake  Comments/MD notified: Dr. Alpha GulaSchmitz    Lihanna Biever A  Remember to initiate Hypoglycemia Order Set & complete

## 2014-05-27 NOTE — Progress Notes (Signed)
Pt remains off ventilator and on trach collar

## 2014-05-27 NOTE — Progress Notes (Signed)
PULMONARY / CRITICAL CARE MEDICINE   Name: [name] MRN: [id number] DOB: Aug 15, 1965    ADMISSION DATE:  05/25/2014 CONSULTATION DATE:  05/25/14  REFERRING MD :  EDP  CHIEF COMPLAINT:  Change in MS   INITIAL PRESENTATION: 48 yr old recent admission NSTEMI, h/o DKA presents with change in MS,. Low BP, concern DKA.  STUDIES:  11/9:  CT head>> neg   SIGNIFICANT EVENTS: 11/9: change in MS  11/9: intubated  11/10: acidosis resolved  11/10: extubated  11/10: transitioned off insulin gtt  SUBJECTIVE:   11/11: lying in bed with no distress. Feels much better compared to arrival.   VITAL SIGNS: Temp:  [97.9 F (36.6 C)-99.7 F (37.6 C)] 98.5 F (36.9 C) (11/11 0700) Pulse Rate:  [73-100] 91 (11/11 0700) Resp:  [8-24] 15 (11/11 0700) BP: (102-167)/(55-118) 142/71 mmHg (11/11 0700) SpO2:  [100 %] 100 % (11/11 0700) FiO2 (%):  [40 %] 40 % (11/11 0320) Weight:  [120 lb 9.5 oz (54.7 kg)] 120 lb 9.5 oz (54.7 kg) (11/11 0428) HEMODYNAMICS:   VENTILATOR SETTINGS: Vent Mode:  [-] PSV;CPAP FiO2 (%):  [40 %] 40 % PEEP:  [5 cmH20] 5 cmH20 Pressure Support:  [12 cmH20-15 cmH20] 12 cmH20 INTAKE / OUTPUT:  Intake/Output Summary (Last 24 hours) at 05/27/14 0828 Last data filed at 05/27/14 0600  Gross per 24 hour  Intake 1956.8 ml  Output    410 ml  Net 1546.8 ml    PHYSICAL EXAMINATION: General: Comfortable, NAD  Neuro: Moves all ext equal, alert and awake  HEENT: Trach wnl Cardiovascular: s1s2, RRR Lungs: some rhonchi  Abdomen: Soft, BS hypo, no r/g Musculoskeletal: No edema  LABS:  CBC  Recent Labs Lab 05/25/14 1703 05/26/14 0515 05/27/14 0319  WBC 25.4* 15.2* 13.8*  HGB 7.3* 7.6* 7.3*  HCT 24.7* 23.1* 22.6*  PLT 350 300 250   Coag's  Recent Labs Lab 05/25/14 1703  APTT 24  INR 1.56*   BMET  Recent Labs Lab 05/26/14 0819 05/26/14 1131 05/27/14 0319  NA 145 142 146  K 3.3*  3.3* 4.0 3.5*  CL 111 109 113*  CO2 20 18* 21  BUN 31* 30*  24*  CREATININE 1.00 0.93 0.85  GLUCOSE 151* 138* 52*   Electrolytes  Recent Labs Lab 05/26/14 0515  05/26/14 0819 05/26/14 1131 05/27/14 0319  CALCIUM 8.7  < > 8.2* 7.9* 7.3*  MG 1.8  --   --   --   --   PHOS 0.6*  --   --   --   --   < > = values in this interval not displayed. Sepsis Markers  Recent Labs Lab 05/25/14 1703 05/25/14 1727 05/25/14 2133  LATICACIDVEN 5.4* 4.91* 3.6*   ABG  Recent Labs Lab 05/25/14 1748 05/26/14 0450 05/26/14 0625  PHART 7.072* 7.618* 7.528*  PCO2ART 14.5* 19.4* 24.3*  PO2ART 77.7* 213.0* 144.0*   Liver Enzymes  Recent Labs Lab 05/25/14 1454 05/25/14 1703  AST 15 25  ALT 16 18  ALKPHOS 105 80  BILITOT <0.2* <0.2*  ALBUMIN 3.8 2.9*   Cardiac Enzymes  Recent Labs Lab 05/25/14 1703  TROPONINI <0.30   Glucose  Recent Labs Lab 05/26/14 1128 05/26/14 1226 05/26/14 1322 05/26/14 1426 05/26/14 1615 05/26/14 2145  GLUCAP 130* 137* 127* 138* 184* 92    Imaging No results found.   ASSESSMENT / PLAN:  PULMONARY Trach Chronic>> A: P:   CXR PRN  ABG PRN   CARDIOVASCULAR  A: Hypotension - resolved  Hypokalemia  Recent NSTEMI s/p cath showing widely patient coronary arteries.  sCHF EF 25-30%  P:  Holding lipitor, lopressor 12.5 mg  Pressors (neo)  ASA  RENAL A:  Anion Gap metabolic acidosis - resolved  Respiratory alkalosis  Hypokalemia  P:   S/p Sodium Bicarb bolus   IVPB K 10 mEq x 4  Transition to pressure support and off vent   GASTROINTESTINAL A:  PEG tube chronic   P:   Carb modified diet  PPI  Consider removal of PEG tube  HEMATOLOGIC A:  VTE Ppx Leukocytosis - resolved   P:  SCD's   INFECTIOUS A:  R/o HCAP  UC 11/9>> 100,000 colonies Diphtheroids (corynebacterium). Asymptomatic currently and prior to admission.  P:   Discussed with ID about Urine cx, since asymptomatic no need to treat  BCx2 11/9>>NGTD  Sputum not sent  Abx: Vanc, start date 11/9>>11/10 Abx: Cefepime,  start date 11/9>>11/10  ENDOCRINE A: DKA vs HONK   DM2  P:   SubQ insulin 20 U  Sen SSI   NEUROLOGIC A:  Acute metabolic encephalopathy - resolved.  Mental d/o, Hx of ?bipolar  Hx of Polysubstance abuse  P:   Sertraline, Seroquel, PRN klonopin  Methodone was stopped on dc prior admission.  RASS goal: 0,-1 WUA  Fentanyl PRN   FAMILY  - Updates: no family at bedside.   - Inter-disciplinary family meet or Palliative Care meeting due by:  Day 7   TODAY'S SUMMARY: 48 yo presenting with likely metabolic encephalopathy with resolution of acidosis and blood sugar. Transitioned to subq yesterday. Low blood sugar 52 this morning from lack of eating. Given amp of D50 and encouraged to eat. Transfer to floor.    Myra RudeJeremy E Schmitz, MD PGY-2,  Family Medicine 05/27/2014, 8:28 AM   Attending:  I have seen and examined the patient with nurse practitioner/resident and agree with the note above.   She has improved from a DKA standpoint.  On exam she is awake and alert, denies UTI symptoms.  No clear cause of DKA.  Remove HD cath, move to floor, IMTS service.   Heber CarolinaBrent McQuaid, MD [facility] Pager: [phone] Cell: [phone] If no response, call [phone]

## 2014-05-27 NOTE — Clinical Social Work Psychosocial (Signed)
Clinical Social Work Department BRIEF PSYCHOSOCIAL ASSESSMENT 05/27/2014  Patient:  [name], [name]     Account Number:  [id number]     Admit date:  05/25/2014  Clinical Social Worker:  Glendon Axe, CLINICAL SOCIAL WORKER  Date/Time:  05/27/2014 12:05 PM  Referred by:  Care Management  Date Referred:  05/26/2014 Referred for  Psychosocial assessment   Other Referral:   Interview type:  Other - See comment Other interview type:   CSW spoke with pt at bedside as well as pt's mother, [name] via telephone.    PSYCHOSOCIAL DATA Living Status:  ALONE Admitted from facility:   Level of care:   Primary support name:  [name] Primary support relationship to patient:  PARENT Degree of support available:   Strong    CURRENT CONCERNS Current Concerns  None Noted   Other Concerns:    SOCIAL WORK ASSESSMENT / PLAN Clinical Social Worker met with pt at bedside to complete psychosocial assessment. Pt expressed she needed to pay rent. CSW advised pt to contact family to assure payment is made. Pt reported she would contact daughter, [name] once she is off work. Pt also reported daughter, [name] as her [place]. Pt also reported she has been to [place] in the past but does not wish to return. Pt gave CSW permission to contact daughter and mother, [name]. CSW attempted to contact pt's daughter and was unable to leave a message. CSW also contacted pt's mother for additional information. Pt's mother reported she has tried to become pt's HCPOA/ guardian in the past and pt declined/refused to sign documents and reported that daughter ([name]) was her HCPOA. Pt's mother also stated she will support any decision made by pt's daughter. CSW will continue to contact pt's daughter, [name] for [facility] information. CSW available as needed.   Assessment/plan status:  Psychosocial Support/Ongoing Assessment of Needs Other assessment/ plan:   Information/referral to community resources:     PATIENT'S/FAMILY'S RESPONSE TO PLAN OF CARE: Pt sitting at bedside, alert and oriented. Pt smiling and stated she would like to return home upon discharge. Pt refusing SNF placement if recommended. Pt also stated she is feeling much better today. Pt expressed no further concerns. CSW will continue to make contact with pt's daughter.    Glendon Axe, MSW, LCSWA [phone] 05/27/2014 4:31 PM

## 2014-05-27 NOTE — Clinical Social Work Note (Signed)
Clinical Social Worker assessed patient this morning. Full CSW psychosocial to follow.   Derenda FennelBashira Anessa Charley, MSW, LCSWA [phone] 05/27/2014 3:23 PM

## 2014-05-27 NOTE — Progress Notes (Signed)
eLink Physician-Brief Progress Note Patient Name: Nicole DistanceLenora [name] DOB: July 15, 1966 MRN: [id number]   Date of Service  05/27/2014  HPI/Events of Note  Pt noted to have b/l wheezing by RT.  eICU Interventions  Will order prn albuterol neb tx.     Intervention Category Major Interventions: Other:  [name] 05/27/2014, 4:19 PM

## 2014-05-28 ENCOUNTER — Encounter (HOSPITAL_COMMUNITY): Payer: Self-pay

## 2014-05-28 DIAGNOSIS — E1165 Type 2 diabetes mellitus with hyperglycemia: Secondary | ICD-10-CM

## 2014-05-28 DIAGNOSIS — E119 Type 2 diabetes mellitus without complications: Secondary | ICD-10-CM

## 2014-05-28 LAB — CBC
HCT: 23.5 % — ABNORMAL LOW (ref 36.0–46.0)
Hemoglobin: 7.6 g/dL — ABNORMAL LOW (ref 12.0–15.0)
MCH: 19.5 pg — ABNORMAL LOW (ref 26.0–34.0)
MCHC: 32.3 g/dL (ref 30.0–36.0)
MCV: 60.3 fL — ABNORMAL LOW (ref 78.0–100.0)
PLATELETS: 189 10*3/uL (ref 150–400)
RBC: 3.9 MIL/uL (ref 3.87–5.11)
RDW: 18.7 % — AB (ref 11.5–15.5)
WBC: 4.9 10*3/uL (ref 4.0–10.5)

## 2014-05-28 LAB — BASIC METABOLIC PANEL
ANION GAP: 12 (ref 5–15)
BUN: 12 mg/dL (ref 6–23)
CO2: 19 mEq/L (ref 19–32)
Calcium: 7.4 mg/dL — ABNORMAL LOW (ref 8.4–10.5)
Chloride: 107 mEq/L (ref 96–112)
Creatinine, Ser: 0.58 mg/dL (ref 0.50–1.10)
GFR calc Af Amer: >90 mL/min (ref >90)
Glucose, Bld: 276 mg/dL — ABNORMAL HIGH (ref 70–99)
Potassium: 4.4 mEq/L (ref 3.7–5.3)
Sodium: 138 mEq/L (ref 137–147)

## 2014-05-28 LAB — GLUCOSE, CAPILLARY
GLUCOSE-CAPILLARY: 239 mg/dL — AB (ref 70–99)
Glucose-Capillary: 233 mg/dL — ABNORMAL HIGH (ref 70–99)
Glucose-Capillary: 255 mg/dL — ABNORMAL HIGH (ref 70–99)
Glucose-Capillary: 229 mg/dL — ABNORMAL HIGH (ref 70–99)

## 2014-05-28 NOTE — Procedures (Addendum)
Tracheostomy Change Note  Patient Details:   Name: [name] DOB: 12-17-1965 MRN: [id number]    Airway Documentation:     Evaluation  O2 sats: stable throughout Complications: No apparent complications Patient did tolerate procedure well. Time Out done, suctioned previous 4.0 cuffed,(balloon deflated) Trach for scant amount white sputum, replaced with 4.0 cuffless Shiley trach,(back-up placed in room), per t.o. by Cindee LamePete babcock C-NP/Dr. Molli KnockYacoub, CCM, no complications, assisted by another staff RT, small amount blood tinged sputum s/p post procedure, (+) EtCO2 Cap noted, b/l b.s. sats remained 98-100% t/o, <'d Fi02 to 28%/5 lpm, new aerosol set up, drain sponge, trach securing device placed prior, Sanmina-SCIPassey Muir valve replace with pt. able to speak, "easier", new obtorator placed in bag @ head of bed with Trach status sheet updated, RT to monitor.  Joylene JohnSweeney, Suzy Kugel Mitchell 05/28/2014, 5:25 PM
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
## 2014-05-28 NOTE — Clinical Social Work Note (Signed)
Patient transferred to 5W. Handoff completed for receiving Clinical Child psychotherapistocial Worker. This CSW signing off.   Derenda FennelBashira Nandika Stetzer, MSW, LCSWA [phone] 05/28/2014 11:43 AM

## 2014-05-28 NOTE — Progress Notes (Signed)
Pt seen at this time for trach consult. Pt remains on 35% ATC tolerating well. Pt has chronic trach in place. No education needed at this time. Will continue to follow.

## 2014-05-28 NOTE — Trach Care Team (Signed)
Trach Care Progression Note   Patient Details Name: [name] MRN: [id number] DOB: 17-Apr-1966 Today's Date: 05/28/2014   Tracheostomy Assessment    Tracheostomy Shiley 4 mm Uncuffed (Active)  Status Secured 05/28/2014 11:06 AM  Site Assessment Dry 05/28/2014 11:06 AM  Site Care Cleansed 05/28/2014 11:06 AM  Inner Cannula Care Changed/new 05/28/2014 11:06 AM  Ties Assessment Changed;Clean;Dry;Secure 05/28/2014 11:06 AM  Cuff pressure (cm) 0 cm 05/28/2014 11:06 AM  Trach Changed Yes 05/25/2014  4:58 PM  Emergency Equipment at bedside Yes 05/28/2014 11:06 AM     Care Needs     Respiratory Therapy Tracheostomy: Chronic trach O2 Device: Tracheostomy Collar FiO2 (%): 35 % SpO2: 100 %    Speech Language Pathology  SLP chart review complete: Patient does not need SLP services at this time (Pt evaluated, SLP aigned off, normal swallow independent PMV) Patient may use Passy-Muir Speech Valve: During all waking hours (remove during sleep) PMSV Supervision: Intermittent Follow up Recommendations: None   Physical Therapy Ambulation/Gait assistance: Min assist PT Recommendation/Assessment: Patient needs continued PT services Follow Up Recommendations: Home health PT, Supervision for mobility/OOB PT equipment: None recommended by PT    Occupational Therapy      Nutritional Patient's Current Diet: Carb modified    Case Management/Social Work  Pt lives with family.    Provider [facility] Care Team/Provider                 Recommendations [facility] Team Members -  Harlon DittyBonnie DeBlois, SLP, Gretta CoolZackary Brooks, SW,       Anders SimmondsPete Babcock, ACNP Will need follow-up at trach clinic after discharge.           Drisana Schweickert, Silva BandyDebra Anita (scribe for team)  05/28/2014, 2:33 PM

## 2014-05-28 NOTE — Progress Notes (Signed)
Inpatient Diabetes Program Recommendations  AACE/ADA: New Consensus Statement on Inpatient Glycemic Control (2013)  Target Ranges:  Prepandial:   less than 140 mg/dL      Peak postprandial:   less than 180 mg/dL (1-2 hours)      Critically ill patients:  140 - 180 mg/dL  Inpatient Diabetes Program Recommendations Insulin - Basal: Pt splits lantus dose of 10 units bid for total of 20 units/day.1(safer to split in case of hypoglycemia-then can decrease the next scheduled dose of lantus) Insulin - Meal Coverage: When patient drinks her supplement of Ensure, her glucose rises and stays in 200's-please consider changing to Glucerna (for less carbohydrate) or cover the supplement with 3 units each time.   Thank you, Nicole CoffinAnn Ludell Zacarias, RN, CNS, Diabetes Coordinator [phone])

## 2014-05-29 DIAGNOSIS — E785 Hyperlipidemia, unspecified: Secondary | ICD-10-CM

## 2014-05-29 DIAGNOSIS — R4182 Altered mental status, unspecified: Secondary | ICD-10-CM

## 2014-05-29 LAB — GLUCOSE, CAPILLARY
GLUCOSE-CAPILLARY: 339 mg/dL — AB (ref 70–99)
Glucose-Capillary: 237 mg/dL — ABNORMAL HIGH (ref 70–99)
Glucose-Capillary: 205 mg/dL — ABNORMAL HIGH (ref 70–99)

## 2014-05-29 LAB — BASIC METABOLIC PANEL
Anion gap: 10 (ref 5–15)
BUN: 7 mg/dL (ref 6–23)
CALCIUM: 7.9 mg/dL — AB (ref 8.4–10.5)
CO2: 23 mEq/L (ref 19–32)
Chloride: 108 mEq/L (ref 96–112)
Creatinine, Ser: 0.55 mg/dL (ref 0.50–1.10)
GFR calc Af Amer: >90 mL/min (ref >90)
GLUCOSE: 186 mg/dL — AB (ref 70–99)
Potassium: 3.7 mEq/L (ref 3.7–5.3)
SODIUM: 141 meq/L (ref 137–147)

## 2014-05-29 LAB — PHOSPHORUS: PHOSPHORUS: 1.6 mg/dL — AB (ref 2.3–4.6)

## 2014-05-29 MED ORDER — GLUCOSE BLOOD VI STRP: ORAL_STRIP | Status: DC

## 2014-05-29 MED ORDER — INSULIN ASPART 100 UNIT/ML ~~LOC~~ SOLN: 5.0000 [IU] | Freq: Three times a day (TID) | SUBCUTANEOUS | Status: DC

## 2014-05-29 MED ORDER — ACCU-CHEK AVIVA CONNECT W/DEVICE KIT: 1.0000 | PACK | Freq: Three times a day (TID) | Status: DC

## 2014-05-29 MED ORDER — ACCU-CHEK SOFTCLIX LANCET DEV MISC: Status: DC

## 2014-05-29 NOTE — Plan of Care (Signed)
Problem: Acute Rehab PT Goals(only PT should resolve) Goal: Pt Will Ambulate Outcome: Completed/Met Date Met:  05/29/14  Problem: Acute Rehab PT Goals(only PT should resolve) Goal: Pt Will Go Up/Down Stairs Outcome: Completed/Met Date Met:  05/29/14     

## 2014-05-29 NOTE — Progress Notes (Signed)
Consult for glucose meter for home.  Pt to be discharged today and MD has written prescription for glucose meter and supplies. RN verified same. To f/u with PCP for diabetes management. Will likely need adjustments to insulin, based on logbook per patient.  Thank you. Ailene Ardshonda Ernestina Joe, RD, LDN, CDE Inpatient Diabetes Coordinator [phone]
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
## 2014-05-29 NOTE — Discharge Instructions (Signed)
Thank you for allowing us to be involved in your healthcare while you were hospitalized [facility].   Please note that there have been changes to your home medications.  --> PLEASE LOOK AT YOUR DISCHARGE MEDICATION LIST FOR DETAILS.  Please call your PCP if you have any questions or concerns, or any difficulty getting any of your medications.  Please return to the ER if you have worsening of your symptoms or new severe symptoms arise.  PLEASE pick up your glucometer. Continue to take your Lantus 12 Units twice a day. START to take your Novolog short acting insulin 5 units three times a day with meals and remember to please check your blood sugar.  Diabetic Ketoacidosis Diabetic ketoacidosis (DKA) is a life-threatening complication of type 1 diabetes. It must be quickly recognized and treated. Treatment requires hospitalization. CAUSES  When there is no insulin in the body, glucose (sugar) cannot be used, and the body breaks down fat for energy. When fat breaks down, acids (ketones) build up in the blood. Very high levels of glucose and high levels of acids lead to severe loss of body fluids (dehydration) and other dangerous chemical changes. This stresses your vital organs and can cause coma or death. SIGNS AND SYMPTOMS   Tiredness (fatigue).  Weight loss.  Excessive thirst.  Ketones in your urine.  Light-headedness.  Fruity or sweet smelling breath.  Excessive urination.  Visual changes.  Confusion or irritability.  Nausea or vomiting.  Rapid breathing.  Stomachache or abdominal pain. DIAGNOSIS  Your health care provider will diagnose DKA based on your history, physical exam, and blood tests. The health care provider will check to see if you have another illness that caused you to go into DKA. Most of this will be done quickly in an emergency room. TREATMENT   Fluid replacement to correct dehydration.  Insulin.  Correction of electrolytes, such  as potassium and sodium.  Antibiotic medicines. PREVENTION  Always take your insulin. Do not skip your insulin injections.  If you are sick, treat yourself quickly. Your body often needs more insulin to fight the illness.  Check your blood glucose regularly.  Check urine ketones if your blood glucose is greater than 240 milligrams per deciliter (mg/dL).  Do not use outdated (expired) insulin.  If your blood glucose is high, drink plenty of fluids. This helps flush out ketones. HOME CARE INSTRUCTIONS   If you are sick, follow the advice of your health care provider.  To prevent dehydration, drink enough water and fluids to keep your urine clear or pale yellow.  If you cannot eat, alternate between drinking fluids with sugar (soda, juices, flavored gelatin) and salty fluids (broth, bouillon).  If you can eat, follow your usual diet and drink sugar-free liquids (water, diet drinks).  Always take your usual dose of insulin. If you cannot eat or if your glucose is getting too low, call your health care provider for further instructions.  Continue to monitor your blood or urine ketones every 3-4 hours around the clock. Set your alarm clock or have someone wake you up. If you are too sick, have someone test it for you.  Rest and avoid exercise. SEEK MEDICAL CARE IF:   You have a fever.  You have ketones in your urine, or your blood glucose is higher than a level your health care provider suggests. You may need extra insulin. Call your health care provider if you need advice on adjusting your insulin.  You  cannot drink at least a tablespoon (15 mL) of fluid every 15-20 minutes.  You have been vomiting for more than 2 hours.  You have symptoms of DKA:  Fruity smelling breath.  Breathing faster or slower.  Becoming very sleepy. SEEK IMMEDIATE MEDICAL CARE IF:   You have signs of dehydration:  Decreased urination.  Increased thirst.  Dry skin and  mouth.  Light-headedness.  Your blood glucose is very high (as advised by your health care provider) twice in a row.  You faint.  You have chest pain or trouble breathing.  You have a sudden, severe headache.  You have sudden weakness in one arm or one leg.  You have sudden trouble speaking or swallowing.  You have vomiting or diarrhea that is getting worse after 3 hours.  You have abdominal pain. MAKE SURE YOU:   Understand these instructions.  Will watch your condition.  Will get help right away if you are not doing well or get worse. Document Released: 06/30/2000 Document Revised: 07/08/2013 Document Reviewed: 01/06/2009 [facility] Patient Information 2015 [place], [place]. This information is not intended to replace advice given to you by your health care provider. Make sure you discuss any questions you have with your health care provider.

## 2014-05-29 NOTE — Plan of Care (Signed)
Problem: Phase I Progression Outcomes Goal: Voiding-avoid urinary catheter unless indicated Outcome: Completed/Met Date Met:  05/29/14     

## 2014-05-29 NOTE — Progress Notes (Signed)
Physical Therapy Treatment Patient Details Name: Nicole DistanceLenora Ovitt MRN: [id number] DOB: [birth year] Today's Date: 05/29/2014    History of Present Illness 48 yr old recent admission NSTEMI, h/o DKA presents with change in MS,. Low BP, concern DKA    PT Comments    Patient continues to make good progress towards physical therapy goals. Ambulating up to 325 feet with supervision and safely completed stair training today. SpO2 maintained 95% and greater during therapy session while on room air. Feel she is adequate for d/c from a mobility standpoint when medically ready. Patient will continue to benefit from skilled physical therapy services at home with HHPT to further improve independence with functional mobility.   Follow Up Recommendations  Home health PT;Supervision for mobility/OOB     Equipment Recommendations  None recommended by PT    Recommendations for Other Services OT consult     Precautions / Restrictions Precautions Precautions: Fall Precaution Comments: trach, peg Restrictions Weight Bearing Restrictions: No    Mobility  Bed Mobility                  Transfers Overall transfer level: Needs assistance Equipment used: None Transfers: Sit to/from Stand Sit to Stand: Supervision         General transfer comment: Supervision for safety. Correctly places hands on stable surface to rise. no physical assist needed. good stability upon standing.  Ambulation/Gait Ambulation/Gait assistance: Supervision Ambulation [name] (Feet): 325 Feet Assistive device: None Gait Pattern/deviations: Step-through pattern;Decreased stride length     General Gait Details: Cues to increase stride length. Supervision for safety but no loss of balance during bout. SpO2 maintains 95% or greater throughout ambulatory bout.    Stairs Stairs: Yes Stairs assistance: Supervision Stair Management: One rail Right;Step to pattern;Forwards;Alternating pattern Number of Stairs:  13 General stair comments: Alternating steps to ascend, step-to pattern while descending. Good control. VC for sequencing and to take her time. Needed a standing rest break at top of stairs due to fatigue before descending again.  Wheelchair Mobility    Modified Rankin (Stroke Patients Only)       Balance                                    Cognition Arousal/Alertness: Awake/alert Behavior During Therapy: WFL for tasks assessed/performed Overall Cognitive Status: No family/caregiver present to determine baseline cognitive functioning                      Exercises General Exercises - Upper Extremity Elbow Flexion: Strengthening;Both;10 reps;Seated (with 1 lb object) General Exercises - Lower Extremity Ankle Circles/Pumps: AROM;Both;10 reps;Seated Quad Sets: Strengthening;Both;10 reps;Seated Gluteal Sets: Strengthening;Both;10 reps;Seated Long Arc Quad: Strengthening;Both;10 reps;Seated Hip Flexion/Marching: Strengthening;Both;10 reps;Seated Mini-Sqauts: Both;10 reps;Standing;Strengthening    General Comments        Pertinent Vitals/Pain Pain Assessment: No/denies pain    Home Living                      Prior Function            PT Goals (current goals can now be found in the care plan section) Acute Rehab PT Goals Patient Stated Goal: be able to go home PT Goal Formulation: With patient Time For Goal Achievement: 06/10/14 Potential to Achieve Goals: Good Progress towards PT goals: Progressing toward goals    Frequency  Min 3X/week    PT Plan Current plan  remains appropriate    Co-evaluation             End of Session Equipment Utilized During Treatment: Gait belt Activity Tolerance: Patient tolerated treatment well Patient left: in chair;with call bell/phone within reach;with family/visitor present     Time: 1143-1208 PT Time Calculation (min) (ACUTE ONLY): 25 min  Charges:  $Gait Training: 8-22  mins $Therapeutic Exercise: 8-22 mins                    G Codes:      [place]erton Mount[place]bour, Tyrice Hewitt S 05/29/2014, 1:52 PM Sunday SpillersLogan Secor [place], [place] [phone]

## 2014-05-29 NOTE — Plan of Care (Signed)
Problem: Phase I Progression Outcomes Goal: Hemodynamically stable Outcome: Completed/Met Date Met:  05/29/14

## 2014-05-29 NOTE — Progress Notes (Signed)
Subjective:  Patient was seen and examined this morning. Patient is doing very well, no complaints. She has been eating and drinking well and wishes to go home. Patient does not think she would benefit from SNF placement. She requests a glucometer and will check her blood sugars more often.  Objective: Vital signs in last 24 hours: Filed Vitals:   05/29/14 0500 05/29/14 0603 05/29/14 0700 05/29/14 0800  BP:  151/79    Pulse:  77 77 77  Temp:  99.3 F (37.4 C)    TempSrc:  Oral    Resp:  18 16 16   Height:      Weight: 54.7 kg (120 lb 9.5 oz)     SpO2:  100% 100% 100%   Weight change: 1.3 kg (2 lb 13.9 oz)  Intake/Output Summary (Last 24 hours) at 05/29/14 1321 Last data filed at 05/29/14 1005  Gross per 24 hour  Intake    430 ml  Output      1 ml  Net    429 ml    General: Vital signs reviewed.  Patient is well-developed and well-nourished, in no acute distress and cooperative with exam.  Neck: Trach in place, no issues. Cardiovascular: RRR, S1 normal, S2 normal, no murmurs, gallops, or rubs. Pulmonary/Chest: Clear to auscultation bilaterally, no wheezes, rales, or rhonchi. Abdominal: Soft, non-tender, non-distended, BS +, PEG tube in good position. no masses, organomegaly, or guarding present.  Musculoskeletal: No joint deformities, erythema, or stiffness, ROM full and nontender. Extremities: No lower extremity edema bilaterally Skin: Warm, dry and intact. No rashes or erythema. Psychiatric: Normal mood and affect. speech and behavior is normal. Cognition and memory are normal.   Lab Results: Basic Metabolic Panel:  Recent Labs Lab 05/26/14 0515  05/28/14 0455 05/29/14 0645  NA 145  < > 138 141  K 3.1*  < > 4.4 3.7  CL 110  < > 107 108  CO2 18*  < > 19 23  GLUCOSE 219*  < > 276* 186*  BUN 32*  < > 12 7  CREATININE 1.12*  < > 0.58 0.55  CALCIUM 8.7  < > 7.4* 7.9*  MG 1.8  --   --   --   PHOS 0.6*  --   --  1.6*  < > = values in this interval not  displayed. Liver Function Tests:  Recent Labs Lab 05/25/14 1454 05/25/14 1703  AST 15 25  ALT 16 18  ALKPHOS 105 80  BILITOT <0.2* <0.2*  PROT 7.8 6.2  ALBUMIN 3.8 2.9*   CBC:  Recent Labs Lab 05/25/14 1454  05/27/14 0319 05/28/14 0725  WBC 25.0*  < > 13.8* 4.9  NEUTROABS 21.9*  --   --   --   HGB 8.8*  < > 7.3* 7.6*  HCT 31.6*  < > 22.6* 23.5*  MCV 69.9*  < > 61.4* 60.3*  PLT 433*  < > 250 189  < > = values in this interval not displayed. Cardiac Enzymes:  Recent Labs Lab 05/25/14 1703  TROPONINI <0.30  CBG:  Recent Labs Lab 05/28/14 0752 05/28/14 1153 05/28/14 1737 05/28/14 2205 05/29/14 0802 05/29/14 1148  GLUCAP 233* 239* 255* 229* 237* 339*   Coagulation:  Recent Labs Lab 05/25/14 1703  LABPROT 18.8*  INR 1.56*   Urine Drug Screen: Drugs of Abuse     Component Value Date/Time   LABOPIA NONE DETECTED 05/25/2014 1451   COCAINSCRNUR NONE DETECTED 05/25/2014 1451   LABBENZ NONE DETECTED  05/25/2014 1451   AMPHETMU NONE DETECTED 05/25/2014 1451   THCU NONE DETECTED 05/25/2014 1451   LABBARB NONE DETECTED 05/25/2014 1451    Urinalysis:  Recent Labs Lab 05/25/14 1852 05/26/14 0017  COLORURINE YELLOW YELLOW  LABSPEC 1.024 1.025  PHURINE 5.0 5.0  GLUCOSEU >1000* >1000*  HGBUR NEGATIVE NEGATIVE  BILIRUBINUR NEGATIVE NEGATIVE  KETONESUR 40* >80*  PROTEINUR NEGATIVE 30*  UROBILINOGEN 0.2 0.2  NITRITE NEGATIVE NEGATIVE  LEUKOCYTESUR NEGATIVE SMALL*    Micro Results: Recent Results (from the past 240 hour(s))  Urine culture     Status: None   Collection Time: 05/25/14  2:51 PM  Result Value Ref Range Status   Specimen Description URINE, CATHETERIZED  Final   Special Requests NONE  Final   Culture  Setup Time   Final    05/25/2014 23:08 Performed at [facility]    Colony Count   Final    >=100,000 COLONIES/ML Performed at [facility]    Culture   Final    DIPHTHEROIDS(CORYNEBACTERIUM SPECIES) Note:  Standardized susceptibility testing for this organism is not available. Performed at [facility]    Report Status 05/27/2014 FINAL  Final  Blood Culture (routine x 2)     Status: None (Preliminary result)   Collection Time: 05/25/14  2:54 PM  Result Value Ref Range Status   Specimen Description BLOOD RIGHT ANTECUBITAL  Final   Special Requests BOTTLES DRAWN AEROBIC AND ANAEROBIC 5 ML  Final   Culture  Setup Time   Final    05/25/2014 19:47 Performed at [facility]    Culture   Final           BLOOD CULTURE RECEIVED NO GROWTH TO DATE CULTURE WILL BE HELD FOR 5 DAYS BEFORE ISSUING A FINAL NEGATIVE REPORT Performed at [facility]    Report Status PENDING  Incomplete  Blood Culture (routine x 2)     Status: None (Preliminary result)   Collection Time: 05/25/14  3:04 PM  Result Value Ref Range Status   Specimen Description BLOOD LEFT HAND  Final   Special Requests BOTTLES DRAWN AEROBIC AND ANAEROBIC 4 ML  Final   Culture  Setup Time   Final    05/25/2014 19:47 Performed at [facility]    Culture   Final           BLOOD CULTURE RECEIVED NO GROWTH TO DATE CULTURE WILL BE HELD FOR 5 DAYS BEFORE ISSUING A FINAL NEGATIVE REPORT Performed at [facility]    Report Status PENDING  Incomplete   Medications:  I have reviewed the patient's current medications. Prior to Admission:  Prescriptions prior to admission  Medication Sig Dispense Refill Last Dose  . acidophilus (RISAQUAD) CAPS capsule Take 1 capsule by mouth daily.   05/24/2014 at Unknown time  . aspirin EC 81 MG EC tablet Take 1 tablet (81 mg total) by mouth daily. 30 tablet 2 05/25/2014 at Unknown time  . atorvastatin (LIPITOR) 80 MG tablet Take 1 tablet (80 mg total) by mouth daily at 6 PM. 30 tablet 2 05/24/2014 at Unknown time  . clonazePAM (KLONOPIN) 0.5 MG tablet Take 0.5 mg by mouth 2 (two) times daily.    05/25/2014 at Unknown time  . famotidine (PEPCID) 20 MG tablet Take 20 mg by  mouth 2 (two) times daily.   05/25/2014 at Unknown time  . insulin glargine (LANTUS) 100 UNIT/ML injection Inject 0.12 mLs (12 Units total) into the skin 2 (two)  times daily. 10 mL 11 05/24/2014 at Unknown time  . losartan (COZAAR) 25 MG tablet Take 1 tablet (25 mg total) by mouth daily. 30 tablet 2 05/25/2014 at Unknown time  . metoprolol tartrate (LOPRESSOR) 25 MG tablet Take 0.5 tablets (12.5 mg total) by mouth 2 (two) times daily. 30 tablet 2 05/25/2014 at 0830  . Multiple Vitamin (MULTIVITAMIN WITH MINERALS) TABS tablet Take 1 tablet by mouth daily. 30 tablet 11 05/25/2014 at Unknown time  . QUEtiapine (SEROQUEL) 50 MG tablet Take 50 mg by mouth daily.   05/25/2014 at Unknown time  . [DISCONTINUED] insulin aspart (NOVOLOG) 100 UNIT/ML injection Inject 0-12 Units into the skin 3 (three) times daily before meals. Patient uses sliding scale   unknown at Unknown time  . albuterol (PROVENTIL HFA;VENTOLIN HFA) 108 (90 BASE) MCG/ACT inhaler Inhale 2 puffs into the lungs every 4 (four) hours as needed for wheezing or shortness of breath. 1 Inhaler 11 unknown at unknown time  . albuterol (PROVENTIL) (2.5 MG/3ML) 0.083% nebulizer solution Take 3 mLs (2.5 mg total) by nebulization every 6 (six) hours as needed for wheezing. 75 mL 12 unknown at unknown time  . dextrose (GLUTOSE) 40 % GEL Take 37.5 g by mouth as needed (low blood sugar). 10 Tube 11 unknown at unknown time  . ferrous sulfate 325 (65 FE) MG tablet Take 325 mg by mouth 2 (two) times daily with a meal.   unknown at unknown time  . glucose blood (ACCU-CHEK SMARTVIEW) test strip 1 each by Other route 6 (six) times daily. Test at least 6 times daily anytime you feel your sugar is low. 250.03. Poorly controlled DM with freq and severe hypoglycemia 200 each 11 unknown at unknown time  . naproxen (NAPROSYN) 500 MG tablet Take 1 tablet (500 mg total) by mouth 2 (two) times daily. 60 tablet 0 unknown at unknown time  . sertraline (ZOLOFT) 100 MG tablet Take 100  mg by mouth at bedtime.   unknown at Unknown time  . vitamin C (ASCORBIC ACID) 500 MG tablet Take 500 mg by mouth daily.   unknown at unknown time   Scheduled Meds: . aspirin EC  81 mg Oral Daily  . Chlorhexidine Gluconate Cloth  6 each Topical Q0600  . feeding supplement (ENSURE COMPLETE)  237 mL Oral TID BM  . heparin  5,000 Units Subcutaneous 3 times per day  . insulin aspart  0-9 Units Subcutaneous TID WC  . insulin glargine  20 Units Subcutaneous Q24H  . mupirocin ointment  1 application Nasal BID  . pantoprazole  40 mg Oral QHS   Continuous Infusions: . sodium chloride 100 mL/hr at 05/29/14 1005   PRN Meds:.albuterol Assessment/Plan: Active Problems:   Shock   Altered mental status   Encounter for central line placement  DKA with anion gap metabolic acidosis completely resolved: GAP continues to be closed and pt CBGS 200s. Pt tolerating sq insulin. Patient did not have a glucometer at home and was arbitrarily giving herself insulin at home. Patient will be discharged home with Lantus 12 units BID and Novolog 5 units TID with meals. -DME glucometer with strips and lancets -Carb mod diet  -Continue SSI- sensitive -Continue lantus 20 units qhs -Diabetes education   AMS resolved: Resolved. 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 -Albuterol neb q4h   DVT/PE ppx: Heparin TID  Dispo: Disposition is deferred  at this time, awaiting improvement of current medical problems.  Anticipated discharge in approximately 1-2 day(s).   The patient does have a current PCP (Rich Numberarly Rivet, MD) and [facility] after discharge.  The patient does have transportation limitations that hinder transportation to clinic appointments.  .Services Needed at time of discharge: Y = Yes, Blank = No PT:   OT:   RN:   Equipment:    Other:     LOS: 4 days   Jill AlexandersAlexa Richardson, DO PGY-1 Internal Medicine Resident Pager # [phone] 05/29/2014 1:21 PM

## 2014-05-29 NOTE — Plan of Care (Signed)
Problem: Phase I Progression Outcomes Goal: Pain controlled with appropriate interventions Outcome: Completed/Met Date Met:  05/29/14     

## 2014-05-31 LAB — CULTURE, BLOOD (ROUTINE X 2)
CULTURE: NO GROWTH
Culture: NO GROWTH

## 2014-06-01 ENCOUNTER — Other Ambulatory Visit: Payer: Self-pay | Admitting: *Deleted

## 2014-06-01 ENCOUNTER — Other Ambulatory Visit: Payer: Self-pay | Admitting: Internal Medicine

## 2014-06-01 LAB — POCT I-STAT 3, ART BLOOD GAS (G3+)
Acid-Base Excess: 12 mmol/L — ABNORMAL HIGH (ref 0.0–2.0)
BICARBONATE: 35.6 meq/L — AB (ref 20.0–24.0)
O2 Saturation: 89 %
PH ART: 7.523 — AB (ref 7.350–7.450)
PO2 ART: 51 mmHg — AB (ref 80.0–100.0)
Patient temperature: 98.9
TCO2: 37 mmol/L (ref 0–100)
pCO2 arterial: 43.3 mmHg (ref 35.0–45.0)

## 2014-06-01 LAB — CULTURE, BLOOD (ROUTINE X 2)
CULTURE: NO GROWTH
Culture: NO GROWTH

## 2014-06-02 MED ORDER — INSULIN PEN NEEDLE 32G X 4 MM MISC: 1.0000 | Freq: Once | Status: DC

## 2014-06-04 ENCOUNTER — Ambulatory Visit (INDEPENDENT_AMBULATORY_CARE_PROVIDER_SITE_OTHER): Payer: Medicaid Other | Admitting: Cardiology

## 2014-06-04 ENCOUNTER — Encounter: Payer: Self-pay | Admitting: Cardiology

## 2014-06-04 VITALS — BP 116/76 | HR 103 | Ht 61.0 in | Wt 109.3 lb

## 2014-06-04 DIAGNOSIS — I5022 Chronic systolic (congestive) heart failure: Secondary | ICD-10-CM

## 2014-06-04 MED ORDER — CARVEDILOL 6.25 MG PO TABS: 6.2500 mg | ORAL_TABLET | Freq: Two times a day (BID) | ORAL | Status: DC

## 2014-06-04 NOTE — Patient Instructions (Signed)
Your physician recommends that you schedule a follow-up appointment in: 6 Months with Dr Rennis GoldenHilty  Your physician has recommended you make the following change in your medication: Decrease Atorvastatin to 40 mg daily, STOP Metoprolol and START Carvedilol 6.25 mg twice a day
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
## 2014-06-09 ENCOUNTER — Emergency Department (HOSPITAL_COMMUNITY)
Admission: EM | Admit: 2014-06-09 | Discharge: 2014-06-09 | Disposition: A | Discharge status: Home / Self Care | Payer: Medicaid Other | Attending: Emergency Medicine | Admitting: Emergency Medicine

## 2014-06-09 ENCOUNTER — Encounter (HOSPITAL_COMMUNITY): Payer: Self-pay

## 2014-06-09 DIAGNOSIS — F99 Mental disorder, not otherwise specified: Secondary | ICD-10-CM | POA: Insufficient documentation

## 2014-06-09 DIAGNOSIS — Z791 Long term (current) use of non-steroidal anti-inflammatories (NSAID): Secondary | ICD-10-CM | POA: Diagnosis not present

## 2014-06-09 DIAGNOSIS — Z794 Long term (current) use of insulin: Secondary | ICD-10-CM | POA: Diagnosis not present

## 2014-06-09 DIAGNOSIS — Z79899 Other long term (current) drug therapy: Secondary | ICD-10-CM | POA: Diagnosis not present

## 2014-06-09 DIAGNOSIS — K9423 Gastrostomy malfunction: Secondary | ICD-10-CM | POA: Insufficient documentation

## 2014-06-09 DIAGNOSIS — I2510 Atherosclerotic heart disease of native coronary artery without angina pectoris: Secondary | ICD-10-CM | POA: Diagnosis not present

## 2014-06-09 DIAGNOSIS — I10 Essential (primary) hypertension: Secondary | ICD-10-CM | POA: Insufficient documentation

## 2014-06-09 DIAGNOSIS — E109 Type 1 diabetes mellitus without complications: Secondary | ICD-10-CM | POA: Insufficient documentation

## 2014-06-09 DIAGNOSIS — E785 Hyperlipidemia, unspecified: Secondary | ICD-10-CM | POA: Insufficient documentation

## 2014-06-09 DIAGNOSIS — Z87891 Personal history of nicotine dependence: Secondary | ICD-10-CM | POA: Insufficient documentation

## 2014-06-09 DIAGNOSIS — D509 Iron deficiency anemia, unspecified: Secondary | ICD-10-CM | POA: Insufficient documentation

## 2014-06-09 DIAGNOSIS — Z9049 Acquired absence of other specified parts of digestive tract: Secondary | ICD-10-CM | POA: Insufficient documentation

## 2014-06-09 DIAGNOSIS — Z7982 Long term (current) use of aspirin: Secondary | ICD-10-CM | POA: Insufficient documentation

## 2014-06-09 DIAGNOSIS — Z431 Encounter for attention to gastrostomy: Secondary | ICD-10-CM

## 2014-06-09 DIAGNOSIS — Z8619 Personal history of other infectious and parasitic diseases: Secondary | ICD-10-CM | POA: Insufficient documentation

## 2014-06-09 LAB — CBG MONITORING, ED: GLUCOSE-CAPILLARY: 80 mg/dL (ref 70–99)

## 2014-06-09 NOTE — Discharge Instructions (Signed)
PEG, Home Care °You had a percutaneous endoscopic gastrostomy (PEG) tube put in your stomach. Do not put anything in your feeding tube until you have been shown how to use it. °HOME CARE °For the first 24 hours: °· Do not sign any important or legal papers. °· Do not drive. °Every day: °· Check the place where the tube goes into your belly. See your doctor if the tube site is red, puffy (swollen), or smells bad. °· Clean around the tube with soap and water. Dry the area by patting it gently with a clean towel. °· Do not put a bandage around the tube site. Keep the area dry. °· You should turn the bumper on the outside of your belly. The bumper should lie flat against your skin so you are comfortable. °· Do not put whole pills through the tube. Instead, crush and dissolve all pills in warm water before placing down tube. °GET HELP RIGHT AWAY IF: °· You throw up (vomit). °· The pain in your belly gets worse. °· You have trouble breathing. °· You have a temperature by mouth above 102° F (38.9° C), not controlled by medicine. °· You have pain when you swallow. °· You have chest pain. °· There is green or yellow fluid (drainage) around the tube site that smells bad. °· There is redness and puffiness around the tube site. °· The tube comes out. °MAKE SURE YOU: °· Understand these instructions. °· Will watch your condition. °· Will get help right away if you are not doing well or get worse. °Document Released: 08/05/2010 Document Revised: 03/05/2013 Document Reviewed: 01/02/2013 °Exit[employer]® Patient Information ©2015 [employer]. This information is not intended to replace advice given to you by your health care provider. Make sure you discuss any questions you have with your health care provider. ° °

## 2014-06-09 NOTE — ED Notes (Addendum)
G-tube is clogged for 1 week, Pt. Denies any pain. Pt. Also would like her trach tube cleaned. No sob noted
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
## 2014-06-10 ENCOUNTER — Encounter: Payer: Self-pay | Admitting: Pulmonary Disease

## 2014-06-10 ENCOUNTER — Ambulatory Visit: Payer: Medicaid Other | Admitting: Pulmonary Disease

## 2014-06-10 ENCOUNTER — Ambulatory Visit (INDEPENDENT_AMBULATORY_CARE_PROVIDER_SITE_OTHER): Payer: Medicaid Other | Admitting: Pulmonary Disease

## 2014-06-10 ENCOUNTER — Observation Stay (HOSPITAL_COMMUNITY)
Admission: AD | Admit: 2014-06-10 | Discharge: 2014-06-11 | Disposition: A | Discharge status: Home / Self Care | Payer: Medicaid Other | Source: Ambulatory Visit | Attending: Oncology | Admitting: Oncology

## 2014-06-10 ENCOUNTER — Encounter (HOSPITAL_COMMUNITY): Payer: Self-pay

## 2014-06-10 VITALS — BP 133/61 | HR 102 | Temp 98.5°F | Wt 113.8 lb

## 2014-06-10 DIAGNOSIS — E1065 Type 1 diabetes mellitus with hyperglycemia: Secondary | ICD-10-CM

## 2014-06-10 DIAGNOSIS — Z931 Gastrostomy status: Secondary | ICD-10-CM | POA: Insufficient documentation

## 2014-06-10 DIAGNOSIS — I2510 Atherosclerotic heart disease of native coronary artery without angina pectoris: Secondary | ICD-10-CM | POA: Insufficient documentation

## 2014-06-10 DIAGNOSIS — F329 Major depressive disorder, single episode, unspecified: Secondary | ICD-10-CM | POA: Diagnosis not present

## 2014-06-10 DIAGNOSIS — E10649 Type 1 diabetes mellitus with hypoglycemia without coma: Secondary | ICD-10-CM | POA: Diagnosis present

## 2014-06-10 DIAGNOSIS — IMO0002 Reserved for concepts with insufficient information to code with codable children: Secondary | ICD-10-CM

## 2014-06-10 DIAGNOSIS — Z7982 Long term (current) use of aspirin: Secondary | ICD-10-CM | POA: Insufficient documentation

## 2014-06-10 DIAGNOSIS — Z87891 Personal history of nicotine dependence: Secondary | ICD-10-CM

## 2014-06-10 DIAGNOSIS — Z794 Long term (current) use of insulin: Secondary | ICD-10-CM

## 2014-06-10 DIAGNOSIS — I10 Essential (primary) hypertension: Secondary | ICD-10-CM | POA: Diagnosis not present

## 2014-06-10 DIAGNOSIS — Z930 Tracheostomy status: Secondary | ICD-10-CM

## 2014-06-10 DIAGNOSIS — G9340 Encephalopathy, unspecified: Secondary | ICD-10-CM | POA: Insufficient documentation

## 2014-06-10 DIAGNOSIS — F319 Bipolar disorder, unspecified: Secondary | ICD-10-CM | POA: Diagnosis not present

## 2014-06-10 DIAGNOSIS — Z8709 Personal history of other diseases of the respiratory system: Secondary | ICD-10-CM

## 2014-06-10 DIAGNOSIS — I252 Old myocardial infarction: Secondary | ICD-10-CM

## 2014-06-10 DIAGNOSIS — E039 Hypothyroidism, unspecified: Secondary | ICD-10-CM | POA: Insufficient documentation

## 2014-06-10 DIAGNOSIS — F1410 Cocaine abuse, uncomplicated: Secondary | ICD-10-CM | POA: Insufficient documentation

## 2014-06-10 DIAGNOSIS — E785 Hyperlipidemia, unspecified: Secondary | ICD-10-CM | POA: Diagnosis not present

## 2014-06-10 DIAGNOSIS — F418 Other specified anxiety disorders: Secondary | ICD-10-CM

## 2014-06-10 DIAGNOSIS — Z590 Homelessness: Secondary | ICD-10-CM | POA: Diagnosis not present

## 2014-06-10 DIAGNOSIS — D509 Iron deficiency anemia, unspecified: Secondary | ICD-10-CM | POA: Diagnosis not present

## 2014-06-10 DIAGNOSIS — E162 Hypoglycemia, unspecified: Secondary | ICD-10-CM

## 2014-06-10 LAB — BASIC METABOLIC PANEL WITH GFR
BUN: 11 mg/dL (ref 6–23)
CALCIUM: 9.1 mg/dL (ref 8.4–10.5)
CHLORIDE: 101 meq/L (ref 96–112)
CO2: 25 meq/L (ref 19–32)
Creat: 0.57 mg/dL (ref 0.50–1.10)
GFR, Est African American: >89 mL/min
GFR, Est Non African American: >89 mL/min
Glucose, Bld: 50 mg/dL — ABNORMAL LOW (ref 70–99)
Potassium: 3.5 mEq/L (ref 3.5–5.3)
SODIUM: 141 meq/L (ref 135–145)

## 2014-06-10 LAB — GLUCOSE, CAPILLARY
GLUCOSE-CAPILLARY: 36 mg/dL — AB (ref 70–99)
GLUCOSE-CAPILLARY: 49 mg/dL — AB (ref 70–99)
GLUCOSE-CAPILLARY: 122 mg/dL — AB (ref 70–99)
Glucose-Capillary: 29 mg/dL — CL (ref 70–99)
Glucose-Capillary: 32 mg/dL — CL (ref 70–99)
Glucose-Capillary: 40 mg/dL — CL (ref 70–99)
Glucose-Capillary: 67 mg/dL — ABNORMAL LOW (ref 70–99)
Glucose-Capillary: 146 mg/dL — ABNORMAL HIGH (ref 70–99)
Glucose-Capillary: 177 mg/dL — ABNORMAL HIGH (ref 70–99)
Glucose-Capillary: 73 mg/dL (ref 70–99)

## 2014-06-10 LAB — MRSA PCR SCREENING: MRSA by PCR: POSITIVE — AB

## 2014-06-10 LAB — LIPID PANEL
Cholesterol: 148 mg/dL (ref 0–200)
HDL: 67 mg/dL (ref >39)
LDL Cholesterol: 65 mg/dL (ref 0–99)
TRIGLYCERIDES: 82 mg/dL (ref <150)
Total CHOL/HDL Ratio: 2.2 Ratio
VLDL: 16 mg/dL (ref 0–40)

## 2014-06-10 MED ORDER — DEXTROSE-NACL 5-0.9 % IV SOLN: INTRAVENOUS | Status: DC

## 2014-06-10 MED ORDER — SODIUM CHLORIDE 0.9 % IV SOLN
INTRAVENOUS | Status: AC
  Administered 2014-06-10 – 2014-06-11 (×2): via INTRAVENOUS

## 2014-06-10 MED ORDER — ALBUTEROL SULFATE (2.5 MG/3ML) 0.083% IN NEBU: 2.5000 mg | INHALATION_SOLUTION | Freq: Four times a day (QID) | RESPIRATORY_TRACT | Status: DC | PRN

## 2014-06-10 MED ORDER — CARVEDILOL 6.25 MG PO TABS
6.2500 mg | ORAL_TABLET | Freq: Two times a day (BID) | ORAL | Status: DC
  Administered 2014-06-10 – 2014-06-11 (×3): 6.25 mg via ORAL
  Filled 2014-06-10 (×4): qty 1

## 2014-06-10 MED ORDER — ENOXAPARIN SODIUM 40 MG/0.4ML ~~LOC~~ SOLN
40.0000 mg | SUBCUTANEOUS | Status: DC
  Administered 2014-06-10: 40 mg via SUBCUTANEOUS
  Filled 2014-06-10 (×2): qty 0.4

## 2014-06-10 MED ORDER — INSULIN GLARGINE 100 UNIT/ML ~~LOC~~ SOLN: 20.0000 [IU] | Freq: Every morning | SUBCUTANEOUS | Status: DC

## 2014-06-10 MED ORDER — QUETIAPINE FUMARATE 50 MG PO TABS
50.0000 mg | ORAL_TABLET | Freq: Every day | ORAL | Status: DC
  Administered 2014-06-11: 50 mg via ORAL
  Filled 2014-06-10: qty 1

## 2014-06-10 MED ORDER — CHLORHEXIDINE GLUCONATE CLOTH 2 % EX PADS
6.0000 | MEDICATED_PAD | Freq: Every day | CUTANEOUS | Status: DC
  Administered 2014-06-11: 6 via TOPICAL

## 2014-06-10 MED ORDER — SERTRALINE HCL 100 MG PO TABS
100.0000 mg | ORAL_TABLET | Freq: Every day | ORAL | Status: DC
  Administered 2014-06-10: 100 mg via ORAL
  Filled 2014-06-10 (×2): qty 1

## 2014-06-10 MED ORDER — SODIUM CHLORIDE 0.9 % IV SOLN: INTRAVENOUS | Status: DC

## 2014-06-10 MED ORDER — SODIUM CHLORIDE 0.9 % IJ SOLN: 10.0000 mL | INTRAMUSCULAR | Status: DC | PRN

## 2014-06-10 MED ORDER — ALBUTEROL SULFATE (2.5 MG/3ML) 0.083% IN NEBU: 2.5000 mg | INHALATION_SOLUTION | RESPIRATORY_TRACT | Status: DC | PRN

## 2014-06-10 MED ORDER — ATORVASTATIN CALCIUM 40 MG PO TABS
40.0000 mg | ORAL_TABLET | Freq: Every day | ORAL | Status: DC
  Administered 2014-06-10 – 2014-06-11 (×2): 40 mg via ORAL
  Filled 2014-06-10 (×2): qty 1

## 2014-06-10 MED ORDER — LOSARTAN POTASSIUM 25 MG PO TABS
25.0000 mg | ORAL_TABLET | Freq: Every day | ORAL | Status: DC
  Administered 2014-06-11: 25 mg via ORAL
  Filled 2014-06-10: qty 1

## 2014-06-10 MED ORDER — ASPIRIN EC 81 MG PO TBEC
81.0000 mg | DELAYED_RELEASE_TABLET | Freq: Every day | ORAL | Status: DC
  Administered 2014-06-10 – 2014-06-11 (×2): 81 mg via ORAL
  Filled 2014-06-10 (×2): qty 1

## 2014-06-10 MED ORDER — INSULIN ASPART 100 UNIT/ML ~~LOC~~ SOLN
0.0000 [IU] | Freq: Three times a day (TID) | SUBCUTANEOUS | Status: DC
  Administered 2014-06-10: 1 [IU] via SUBCUTANEOUS
  Administered 2014-06-11: 9 [IU] via SUBCUTANEOUS
  Administered 2014-06-11: 3 [IU] via SUBCUTANEOUS

## 2014-06-10 MED ORDER — FERROUS SULFATE 325 (65 FE) MG PO TABS
325.0000 mg | ORAL_TABLET | Freq: Two times a day (BID) | ORAL | Status: DC
  Administered 2014-06-10 – 2014-06-11 (×2): 325 mg via ORAL
  Filled 2014-06-10 (×4): qty 1

## 2014-06-10 MED ORDER — GLUCAGON HCL RDNA (DIAGNOSTIC) 1 MG IJ SOLR: 1.0000 mg | Freq: Once | INTRAMUSCULAR | Status: DC

## 2014-06-10 MED ORDER — CLONAZEPAM 0.5 MG PO TABS
0.5000 mg | ORAL_TABLET | Freq: Two times a day (BID) | ORAL | Status: DC
  Administered 2014-06-10 – 2014-06-11 (×3): 0.5 mg via ORAL
  Filled 2014-06-10 (×3): qty 1

## 2014-06-10 MED ORDER — MUPIROCIN 2 % EX OINT
1.0000 "application " | TOPICAL_OINTMENT | Freq: Two times a day (BID) | CUTANEOUS | Status: DC
  Administered 2014-06-10: 1 via NASAL
  Filled 2014-06-10 (×2): qty 22

## 2014-06-10 MED ORDER — ALBUTEROL SULFATE HFA 108 (90 BASE) MCG/ACT IN AERS: 2.0000 | INHALATION_SPRAY | RESPIRATORY_TRACT | Status: DC | PRN

## 2014-06-10 MED ORDER — FAMOTIDINE 20 MG PO TABS
20.0000 mg | ORAL_TABLET | Freq: Two times a day (BID) | ORAL | Status: DC
  Administered 2014-06-10 – 2014-06-11 (×3): 20 mg via ORAL
  Filled 2014-06-10 (×5): qty 1

## 2014-06-10 NOTE — Progress Notes (Signed)
Patient's MRSA PCR positive,ordered standing order for positive mrsa pcr ordered per protocol. [name], [name] [name], [name]

## 2014-06-10 NOTE — Progress Notes (Signed)
Peripherally Inserted Central Catheter/Midline Placement  The IV Nurse has discussed with the patient and/or persons authorized to consent for the patient, the purpose of this procedure and the potential benefits and risks involved with this procedure.  The benefits include less needle sticks, lab draws from the catheter and patient may be discharged home with the catheter.  Risks include, but not limited to, infection, bleeding, blood clot (thrombus formation), and puncture of an artery; nerve damage and irregular heat beat.  Alternatives to this procedure were also discussed.  PICC/Midline Placement Documentation        [name], [name] 06/10/2014, 5:20 PM

## 2014-06-10 NOTE — Assessment & Plan Note (Signed)
Lipid panel sent

## 2014-06-10 NOTE — Patient Instructions (Addendum)
Take LANTUS (glargine) 20 units AFTER BREAKFAST ONCE A DAY. Take NOVOLOG (aspart) 5 units THREE TIMES A DAY WITH MEALS. DO NOT TAKE IF BLOOD SUGAR LESS THAN 150.  Please follow up in 1-2 WEEKS.  General Instructions:   Please bring your medicines with you each time you come to clinic.  Medicines may include prescription medications, over-the-counter medications, herbal remedies, eye drops, vitamins, or other pills.   Progress Toward Treatment Goals:  Treatment Goal 06/10/2014  Hemoglobin A1C unchanged  Prevent falls -    Self Care Goals & Plans:  Self Care Goal 06/10/2014  Manage my medications take my medicines as prescribed; bring my medications to every visit  Monitor my health keep track of my blood glucose; bring my glucose meter and log to each visit  Eat healthy foods drink diet soda or water instead of juice or soda; eat baked foods instead of fried foods  Be physically active find an activity I enjoy  Prevent falls -    Home Blood Glucose Monitoring 06/10/2014  Check my blood sugar 3 times a day  When to check my blood sugar before meals     Care Management & Community Referrals:  Referral 06/10/2014  Referrals made for care management support diabetes educator  Referrals made to community resources -

## 2014-06-10 NOTE — Progress Notes (Signed)
Medicine attending: I personally interviewed and briefly examined this patient together with resident physician Dr. Griffin BasilJennifer Krall. It is clear when talking with this woman that she does not have a good understanding of her insulin regimen and cannot distinguish between long-acting and short-acting preparations. She had only recorded a single morning glucose over the last 2 weeks and it was low at 58. She thought she was only taking Lantus insulin once a day in the morning but in fact she was taking Lantus twice daily in addition to regular insulin 3 times during the day. Her afternoon and evening values were in the 300-500 range.  While our office evaluation was in progress, we checked a blood sugar and it was in the 50s. She was given some juice and crackers but a repeat value was even lower. My concern is that she has an unknown amount of insulin in her system right now and is incapable of self-monitoring. We will make arrangements to admit the patient to the hospital. Further discussion with her primary care physician who knows her well, Dr. Rogelia BogaButcher, recommends that we only treat her with low-dose, twice daily, long-acting insulin 7 units of Lantus and abandon the use of the NovoLog secondary to the patient's poor understanding of how to use his preparations and her repetitive admissions for hypoglycemia.

## 2014-06-10 NOTE — Assessment & Plan Note (Addendum)
  Lab Results  Component Value Date   HGBA1C 9.5* 05/16/2014   HGBA1C 9.0 02/05/2014   HGBA1C 10.6 07/31/2013     Assessment: Diabetes control: poor control (HgbA1C >9%) Progress toward A1C goal:  unchanged Comments: Recently hospitalized 05/25/2014 to 05/29/2014 for DKA. Discharged on Lantus 12 units BID and Novolog 5 units TID. Suspect there may be some confusion about her regimen as she initially said she was taking 12 units of Novolog once a day in the morning and 5 units of Lantus three times a day. Reports a couple hypoglycemic episodes. One noted on report from meter. Otherwise blood glucose average is 292, ranging from 125 to greater than 507 (higher than machine's range).  Continues to very difficult to manage with episodes of hypoglycemia and severe hyperglycemia. Acute hypoglycemia in clinic. Glucose went down to 27. Subjectively improved after crackers and juice and glucose gel. Starting to go back up to low 30s.  Plan: Will likely need a simplified insulin regimen. -Glucose gel administered. -IV team paged for access. Will give amp of D50.  -Admit for observation overnight.  -Referral placed for diabetes education -Referral placed for retinal exam

## 2014-06-10 NOTE — Plan of Care (Signed)
Problem: Phase I Progression Outcomes Goal: Voiding-avoid urinary catheter unless indicated Outcome: Completed/Met Date Met:  06/10/14

## 2014-06-10 NOTE — Plan of Care (Signed)
Problem: Phase I Progression Outcomes Goal: OOB as tolerated unless otherwise ordered Outcome: Completed/Met Date Met:  06/10/14     
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
## 2014-06-10 NOTE — Progress Notes (Signed)
Hypoglycemic Event  CBG: 12:40 -  CBG =32, given OJ and crackers, 12: 50, CBG=29  Treatment: 1 tube instant glucose  Symptoms: Shaky  Follow-up CBG: Time:1:20 P CBG Result:36  Possible Reasons for Event: Unknown  Comments/MD notified:Pt being admitted for observation - seems uncertain of insulin doses she is supposed to take - able to speak and mentate per her baseline but was unable to verbalize correct insulin dosing since discharge so unsure what meds pt took at home before coming to office visit.     [name], [name]  Remember to initiate Hypoglycemia Order Set & complete
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
## 2014-06-11 DIAGNOSIS — E10649 Type 1 diabetes mellitus with hypoglycemia without coma: Secondary | ICD-10-CM | POA: Diagnosis not present

## 2014-06-11 LAB — GLUCOSE, CAPILLARY
GLUCOSE-CAPILLARY: 137 mg/dL — AB (ref 70–99)
GLUCOSE-CAPILLARY: 195 mg/dL — AB (ref 70–99)
Glucose-Capillary: 206 mg/dL — ABNORMAL HIGH (ref 70–99)
Glucose-Capillary: 401 mg/dL — ABNORMAL HIGH (ref 70–99)

## 2014-06-11 LAB — BASIC METABOLIC PANEL
Anion gap: 11 (ref 5–15)
BUN: 10 mg/dL (ref 6–23)
CO2: 24 mEq/L (ref 19–32)
Calcium: 7.9 mg/dL — ABNORMAL LOW (ref 8.4–10.5)
Chloride: 106 mEq/L (ref 96–112)
Creatinine, Ser: 0.61 mg/dL (ref 0.50–1.10)
GFR calc non Af Amer: >90 mL/min (ref >90)
Glucose, Bld: 172 mg/dL — ABNORMAL HIGH (ref 70–99)
Potassium: 4.4 mEq/L (ref 3.7–5.3)
Sodium: 141 mEq/L (ref 137–147)

## 2014-06-11 LAB — CBC
HCT: 25 % — ABNORMAL LOW (ref 36.0–46.0)
HEMOGLOBIN: 7.4 g/dL — AB (ref 12.0–15.0)
MCH: 19.7 pg — AB (ref 26.0–34.0)
MCHC: 29.6 g/dL — AB (ref 30.0–36.0)
MCV: 66.5 fL — AB (ref 78.0–100.0)
Platelets: 265 10*3/uL (ref 150–400)
RBC: 3.76 MIL/uL — ABNORMAL LOW (ref 3.87–5.11)
RDW: 23.4 % — ABNORMAL HIGH (ref 11.5–15.5)
WBC: 5.3 10*3/uL (ref 4.0–10.5)

## 2014-06-11 MED ORDER — INSULIN GLARGINE 100 UNIT/ML ~~LOC~~ SOLN: 7.0000 [IU] | Freq: Two times a day (BID) | SUBCUTANEOUS | Status: DC

## 2014-06-11 MED ORDER — INSULIN GLARGINE 100 UNIT/ML ~~LOC~~ SOLN
7.0000 [IU] | Freq: Two times a day (BID) | SUBCUTANEOUS | Status: DC
  Administered 2014-06-11: 7 [IU] via SUBCUTANEOUS
  Filled 2014-06-11 (×2): qty 0.07

## 2014-06-11 MED ORDER — GLUCOSE BLOOD VI STRP: 1.0000 | ORAL_STRIP | Freq: Every day | Status: DC
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
## 2014-06-11 NOTE — Progress Notes (Signed)
Subjective: No complaints this AM. CBG's consistently in the 100-200 range today. No further symptoms of hypoglycemia. No tremulousness or diaphoresis on exam. Mental status at baseline.   Objective: Vital signs in last 24 hours: Filed Vitals:   06/10/14 2308 06/11/14 0414 06/11/14 0503 06/11/14 0738  BP:   148/76   Pulse: 89 94 95 90  Temp:   98.3 F (36.8 C)   TempSrc:      Resp: 18 18 20 20   Height:      Weight:      SpO2: 100% 100% 100% 100%   Weight change:   Intake/Output Summary (Last 24 hours) at 06/11/14 0916 Last data filed at 06/11/14 0700  Gross per 24 hour  Intake 1503.33 ml  Output    551 ml  Net 952.33 ml   General: AA female, alert, cooperative, NAD. Mildly tremulous on exam.  HEENT: PERRL, EOMI. Moist mucus membranes.  Neck: Full range of motion without pain, supple, no lymphadenopathy or carotid bruits. Trach in place w/ [employer] Mu[employer] valve.  Lungs: Clear to ascultation bilaterally, normal work of respiration, no wheezes, rales, rhonchi Heart: RRR, no murmurs, gallops, or rubs Abdomen: Soft, non-tender, non-distended, BS +. PEG tube in place. Scant purulent appearing discharge coming from PEG site. No pain, erythema, or induration surrounding the site.  Extremities: No cyanosis, clubbing, or edema Neurologic: Alert & oriented X3, cranial nerves II-XII intact, strength grossly intact, sensation intact to light touch. Questionable insight involving her medications, illness.   Lab Results: Basic Metabolic Panel:  Recent Labs Lab 06/10/14 1215 06/11/14 0441  NA 141 141  K 3.5 4.4  CL 101 106  CO2 25 24  GLUCOSE 50* 172*  BUN 11 10  CREATININE 0.57 0.61  CALCIUM 9.1 7.9*   CBC:  Recent Labs Lab 06/11/14 0441  WBC 5.3  HGB 7.4*  HCT 25.0*  MCV 66.5*  PLT 265   CBG:  Recent Labs Lab 06/10/14 1552 06/10/14 1732 06/10/14 2021 06/10/14 2211 06/10/14 2359 06/11/14 0743  GLUCAP 146* 177* 73 122* 137* 206*   Fasting Lipid  Panel:  Recent Labs Lab 06/10/14 1215  CHOL 148  HDL 67  LDLCALC 65  TRIG 82  CHOLHDL 2.2   Urine Drug Screen: Drugs of Abuse     Component Value Date/Time   LABOPIA NONE DETECTED 05/25/2014 1451   COCAINSCRNUR NONE DETECTED 05/25/2014 1451   LABBENZ NONE DETECTED 05/25/2014 1451   AMPHETMU NONE DETECTED 05/25/2014 1451   THCU NONE DETECTED 05/25/2014 1451   LABBARB NONE DETECTED 05/25/2014 1451    Micro Results: Recent Results (from the past 240 hour(s))  MRSA PCR Screening     Status: Abnormal   Collection Time: 06/10/14  3:56 PM  Result Value Ref Range Status   MRSA by PCR POSITIVE (A) NEGATIVE Final    Comment:        The GeneXpert MRSA Assay (FDA approved for NASAL specimens only), is one component of a comprehensive MRSA colonization surveillance program. It is not intended to diagnose MRSA infection nor to guide or monitor treatment for MRSA infections. RESULT CALLED TO, READ BACK BY AND VERIFIED WITH: C.BOKIAGON,RN AT 2026 BY L.PITT 06/10/14     Medications: I have reviewed the patient's current medications. Scheduled Meds: . aspirin EC  81 mg Oral Daily  . atorvastatin  40 mg Oral Daily  . carvedilol  6.25 mg Oral BID  . Chlorhexidine Gluconate Cloth  6 each Topical Q0600  . clonazePAM  0.5  mg Oral BID  . enoxaparin (LOVENOX) injection  40 mg Subcutaneous Q24H  . famotidine  20 mg Oral BID  . ferrous sulfate  325 mg Oral BID WC  . insulin aspart  0-9 Units Subcutaneous TID WC  . insulin glargine  7 Units Subcutaneous BID  . losartan  25 mg Oral Daily  . mupirocin ointment  1 application Nasal BID  . QUEtiapine  50 mg Oral Daily  . sertraline  100 mg Oral QHS   Continuous Infusions:  PRN Meds:.albuterol, sodium chloride   Assessment/Plan: [name] is a 48 yo female with PMHx of DM type I, microcytic anemia, HLD, CAD, HTN, trach and PEG dependent, w/ h/o polysubstance abuse, admitted for hypoglycemia.   Hypoglycemia: Resolved, CBG's  improved today. See below:  Recent Labs Lab 06/10/14 1732 06/10/14 2021 06/10/14 2211 06/10/14 2359 06/11/14 0743  GLUCAP 177* 73 122* 137* 206*  -Continue ISS sensitive -Start back Lantus 7 units bid; discussed w/ Dr. Rogelia BogaButcher (former PCP), this is the safest regimen for the patient to use at home.  -D/c IVF -Diet carb modified -Discharge this afternoon w/ clear instructions to continue Lantus 7 units bid ONLY at home.   H/o NSTEMI: Stable. No chest pain or shortness of breath. -Continue home meds; Atorvastatin 40 mg qd, Carvedilol 6.25 mg bid, Losartan 25 mg qd, ASA 81 mg daily  H/o Acute Respiratory Failure s/p Trach and PEG: Wound care consulted for scant PEG drainage, given topical treatment to manage minor skin breakdown at home. Appreciate consult.  -Continue Albuterol 2.5 nebulizer q4h prn  Anxiety/Depression: Stable. Continue home medications. -Continue Klonopin 0.5 mg bid, Seroquel 50 mg qd, Zoloft 100 mg qhs  DVT/PE PPx: Lovenox  Dispo: Anticipated discharge today.   The patient does have a current PCP (Rich Numberarly Rivet, MD) and [facility] after discharge.  The patient does not have transportation limitations that hinder transportation to clinic appointments.  .Services Needed at time of discharge: Y = Yes, Blank = No PT:   OT:   RN:   Equipment:   Other:     LOS: 1 day   Courtney ParisEden W Jamorian Dimaria, MD 06/11/2014, 9:16 AM

## 2014-06-11 NOTE — Progress Notes (Signed)
Patient discharged home with husband by cab voucher. Pt. Given discharge instructions with teach back. Patient verbalized signs and symptoms of worsening condition and verbalized correct insulin dosage (7 units of Lantus and - NO novolog). Pt. Hemodynamically stable.   Ok AnisSanders,Sims Laday A, RN 06/11/2014

## 2014-06-11 NOTE — Progress Notes (Signed)
UR completed 

## 2014-06-11 NOTE — Discharge Instructions (Signed)
1. You have a follow up appointment scheduled below:  Jill Alexandersichardson, Alexa  On 06/18/2014 9:00 AM  [address] [place]  [phone]   2. Please take all of your other medications as previously prescribed. Make note of the following changes:  STOP TAKING NOVOLOG (SHORT ACTING INSULIN).   YOU ARE ONLY TO TAKE LANTUS (LONG ACTING INSULIN) 7 UNITS TWO TIMES DAILY.   Continue to check your blood sugars 3-6 times daily.   3. If you have worsening of your symptoms or new symptoms arise, please call the clinic [phone]), or go to the ER immediately if symptoms are severe.    Hypoglycemia Hypoglycemia occurs when the glucose in your blood is too low. Glucose is a type of sugar that is your body's main energy source. Hormones, such as insulin and glucagon, control the level of glucose in the blood. Insulin lowers blood glucose and glucagon increases blood glucose. Having too much insulin in your blood stream, or not eating enough food containing sugar, can result in hypoglycemia. Hypoglycemia can happen to people with or without diabetes. It can develop quickly and can be a medical emergency.  CAUSES   Missing or delaying meals.  Not eating enough carbohydrates at meals.  Taking too much diabetes medicine.  Not timing your oral diabetes medicine or insulin doses with meals, snacks, and exercise.  Nausea and vomiting.  Certain medicines.  Severe illnesses, such as hepatitis, kidney disorders, and certain eating disorders.  Increased activity or exercise without eating something extra or adjusting medicines.  Drinking too much alcohol.  A nerve disorder that affects body functions like your heart rate, blood pressure, and digestion (autonomic neuropathy).  A condition where the stomach muscles do not function properly (gastroparesis). Therefore, medicines and food may not absorb properly.  Rarely, a tumor of the pancreas can produce too much insulin. SYMPTOMS    Hunger.  Sweating (diaphoresis).  Change in body temperature.  Shakiness.  Headache.  Anxiety.  Lightheadedness.  Irritability.  Difficulty concentrating.  Dry mouth.  Tingling or numbness in the hands or feet.  Restless sleep or sleep disturbances.  Altered speech and coordination.  Change in mental status.  Seizures or prolonged convulsions.  Combativeness.  Drowsiness (lethargic).  Weakness.  Increased heart rate or palpitations.  Confusion.  Pale, gray skin color.  Blurred or double vision.  Fainting. DIAGNOSIS  A physical exam and medical history will be performed. Your caregiver may make a diagnosis based on your symptoms. Blood tests and other lab tests may be performed to confirm a diagnosis. Once the diagnosis is made, your caregiver will see if your signs and symptoms go away once your blood glucose is raised.  TREATMENT  Usually, you can easily treat your hypoglycemia when you notice symptoms.  Check your blood glucose. If it is less than 70 mg/dl, take one of the following:   3-4 glucose tablets.    cup juice.    cup regular soda.   1 cup skim milk.   -1 tube of glucose gel.   5-6 hard candies.   Avoid high-fat drinks or food that may delay a rise in blood glucose levels.  Do not take more than the recommended amount of sugary foods, drinks, gel, or tablets. Doing so will cause your blood glucose to go too high.   Wait 10-15 minutes and recheck your blood glucose. If it is still less than 70 mg/dl or below your target range, repeat treatment.   Eat a snack if  it is more than 1 hour until your next meal.  There may be a time when your blood glucose may go so low that you are unable to treat yourself at home when you start to notice symptoms. You may need someone to help you. You may even faint or be unable to swallow. If you cannot treat yourself, someone will need to bring you to the hospital.  HOME CARE  INSTRUCTIONS  If you have diabetes, follow your diabetes management plan by:  Taking your medicines as directed.  Following your exercise plan.  Following your meal plan. Do not skip meals. Eat on time.  Testing your blood glucose regularly. Check your blood glucose before and after exercise. If you exercise longer or different than usual, be sure to check blood glucose more frequently.  Wearing your medical alert jewelry that says you have diabetes.  Identify the cause of your hypoglycemia. Then, develop ways to prevent the recurrence of hypoglycemia.  Do not take a hot bath or shower right after an insulin shot.  Always carry treatment with you. Glucose tablets are the easiest to carry.  If you are going to drink alcohol, drink it only with meals.  Tell friends or family members ways to keep you safe during a seizure. This may include removing hard or sharp objects from the area or turning you on your side.  Maintain a healthy weight. SEEK MEDICAL CARE IF:   You are having problems keeping your blood glucose in your target range.  You are having frequent episodes of hypoglycemia.  You feel you might be having side effects from your medicines.  You are not sure why your blood glucose is dropping so low.  You notice a change in vision or a new problem with your vision. SEEK IMMEDIATE MEDICAL CARE IF:   Confusion develops.  A change in mental status occurs.  The inability to swallow develops.  Fainting occurs. Document Released: 07/03/2005 Document Revised: 07/08/2013 Document Reviewed: 10/30/2011 [facility] Patient Information 2015 [place], [place]. This information is not intended to replace advice given to you by your health care provider. Make sure you discuss any questions you have with your health care provider.

## 2014-06-11 NOTE — Progress Notes (Signed)
Spoke with RN regarding pt's Passy-Muir valve. MD requested replacement, as it fell on the floor this a.m. Typically valves are cleaned in this situation and replaced only if lost or broken. Please contact me regarding this if any questions. Contact Vernona RiegerLaura at [phone] if after 1 p.m. today. Thank you.  Davy PiqueAmy Isay Perleberg, M.A., [name] [phone]

## 2014-06-11 NOTE — Progress Notes (Signed)
SLP Note  Patient Details Name: [name] MRN: [id number] DOB: [birth year]   Cancelled treatment:       Reason Eval/Treat Not Completed: Pt has a chronic trach and has received SLP services for PMSV usage. SLP signed off as patient was using and maintaining PMSV with Mod I. Per MD, pt's valve was accidentally thrown away earlier today. SLP provided new PMSV, which pt donned and doffed with Mod I. She independently verbalized instructions for cleaning and wearing. No further SLP needs identified at this time, will sign off.    [name], [name] [phone]  [name], [name] 06/11/2014, 1:46 PM

## 2014-06-11 NOTE — Consult Note (Signed)
WOC wound consult note Reason for Consult:Peri-PEG minor denudation.  Moisture associated skin damage (MASD) Wound type: Moisture Pressure Ulcer POA: No Measurement: 0.2 x 0.4cm x 0.2cm Wound JXB:JYNWGbed:clean, pink, moist Drainage (amount, consistency, odor) scant, serous Periwound:intact, dry Dressing procedure/placement/frequency: I have today provided patient and husband with a skin conditioning cleanser and instructed them on its use (one spray, wipe, pat dry-no rinse). This is to be following by application of a thin layer of moisture barrier ointment (Critic Aid Clear).  She reports that this minor irritation is a new issue and that typically, she has no skin problems in the peri-PEG region.  They will use this for PRN just only until reepithelialization occurs. Both patient and husband are appreciative of consultation.  WOC nursing team will not follow, but will remain available to this patient, the nursing and medical team.  Please re-consult if needed. Thanks, Ladona MowLaurie Tyeasha Ebbs, MSN, RN, GNP, [place], CWON-AP [phone])

## 2014-06-11 NOTE — Progress Notes (Signed)
Patient CBG 401 MD aware. Gave 9 Units of Novolog per verbal instruction. Rechecked, CBG 195.    Ok AnisSanders,Davione Lenker A, RN

## 2014-06-13 ENCOUNTER — Emergency Department (HOSPITAL_COMMUNITY)
Admission: EM | Admit: 2014-06-13 | Discharge: 2014-06-13 | Disposition: A | Discharge status: Home / Self Care | Payer: Medicaid Other | Attending: Emergency Medicine | Admitting: Emergency Medicine

## 2014-06-13 ENCOUNTER — Encounter (HOSPITAL_COMMUNITY): Payer: Self-pay | Admitting: *Deleted

## 2014-06-13 DIAGNOSIS — Y848 Other medical procedures as the cause of abnormal reaction of the patient, or of later complication, without mention of misadventure at the time of the procedure: Secondary | ICD-10-CM | POA: Insufficient documentation

## 2014-06-13 DIAGNOSIS — E785 Hyperlipidemia, unspecified: Secondary | ICD-10-CM | POA: Insufficient documentation

## 2014-06-13 DIAGNOSIS — Z8619 Personal history of other infectious and parasitic diseases: Secondary | ICD-10-CM | POA: Diagnosis not present

## 2014-06-13 DIAGNOSIS — E109 Type 1 diabetes mellitus without complications: Secondary | ICD-10-CM | POA: Insufficient documentation

## 2014-06-13 DIAGNOSIS — Z7982 Long term (current) use of aspirin: Secondary | ICD-10-CM | POA: Insufficient documentation

## 2014-06-13 DIAGNOSIS — I10 Essential (primary) hypertension: Secondary | ICD-10-CM | POA: Insufficient documentation

## 2014-06-13 DIAGNOSIS — Z8719 Personal history of other diseases of the digestive system: Secondary | ICD-10-CM | POA: Diagnosis not present

## 2014-06-13 DIAGNOSIS — Z8659 Personal history of other mental and behavioral disorders: Secondary | ICD-10-CM | POA: Insufficient documentation

## 2014-06-13 DIAGNOSIS — Z794 Long term (current) use of insulin: Secondary | ICD-10-CM | POA: Diagnosis not present

## 2014-06-13 DIAGNOSIS — D509 Iron deficiency anemia, unspecified: Secondary | ICD-10-CM | POA: Insufficient documentation

## 2014-06-13 DIAGNOSIS — I2510 Atherosclerotic heart disease of native coronary artery without angina pectoris: Secondary | ICD-10-CM | POA: Diagnosis not present

## 2014-06-13 DIAGNOSIS — Z930 Tracheostomy status: Secondary | ICD-10-CM | POA: Diagnosis not present

## 2014-06-13 DIAGNOSIS — T85518A Breakdown (mechanical) of other gastrointestinal prosthetic devices, implants and grafts, initial encounter: Secondary | ICD-10-CM | POA: Diagnosis not present

## 2014-06-13 DIAGNOSIS — Z87891 Personal history of nicotine dependence: Secondary | ICD-10-CM | POA: Insufficient documentation

## 2014-06-13 DIAGNOSIS — K9423 Gastrostomy malfunction: Secondary | ICD-10-CM

## 2014-06-13 DIAGNOSIS — Z8669 Personal history of other diseases of the nervous system and sense organs: Secondary | ICD-10-CM | POA: Diagnosis not present

## 2014-06-13 DIAGNOSIS — Z79899 Other long term (current) drug therapy: Secondary | ICD-10-CM | POA: Diagnosis not present

## 2014-06-13 NOTE — Discharge Instructions (Signed)
Your feeding tube was flushed. Resume normal use. GET HELP RIGHT AWAY IF:  You throw up (vomit).  The pain in your belly gets worse.  You have trouble breathing.  You have a temperature by mouth above 102 F (38.9 C), not controlled by medicine.  You have pain when you swallow.  You have chest pain.  There is green or yellow fluid (drainage) around the tube site that smells bad.  There is redness and puffiness around the tube site.  The tube comes out. MAKE SURE YOU:  Understand these instructions.  Will watch your condition.  Will get help right away if you are not doing well or get worse. Document Released: 08/05/2010 Document Revised: 03/05/2013 Document Reviewed: 01/02/2013 [facility] Patient Information 2015 [place], [place]. This information is not intended to replace advice given to you by your health care provider. Make sure you discuss any questions you have with your health care provider.

## 2014-06-13 NOTE — ED Notes (Signed)
Pt in with family reporting that her peg tube was clogged this morning, they were advised to come here for help with that, denies any other complaints
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
## 2014-06-13 NOTE — ED Notes (Signed)
Flushed peg tube with warm water then jjgave 200 ml bolus of water flushes easily clamped tube afterward

## 2014-06-16 ENCOUNTER — Ambulatory Visit (HOSPITAL_COMMUNITY)
Admission: RE | Admit: 2014-06-16 | Discharge: 2014-06-16 | Disposition: A | Discharge status: Home / Self Care | Payer: Medicaid Other | Source: Ambulatory Visit | Attending: Pulmonary Disease | Admitting: Pulmonary Disease

## 2014-06-16 DIAGNOSIS — Z430 Encounter for attention to tracheostomy: Secondary | ICD-10-CM | POA: Diagnosis present

## 2014-06-16 DIAGNOSIS — Z87891 Personal history of nicotine dependence: Secondary | ICD-10-CM | POA: Diagnosis not present

## 2014-06-16 DIAGNOSIS — E1310 Other specified diabetes mellitus with ketoacidosis without coma: Secondary | ICD-10-CM | POA: Diagnosis not present

## 2014-06-16 DIAGNOSIS — J9611 Chronic respiratory failure with hypoxia: Secondary | ICD-10-CM

## 2014-06-16 DIAGNOSIS — Z930 Tracheostomy status: Secondary | ICD-10-CM

## 2014-06-16 NOTE — Progress Notes (Signed)
Brief History  48 year old female w/ sig h/o poorly controlled DM w/ frequent bouts of DKA. She is s/p trach back in August 2015 where she had prolonged episode of vent dependence and required trach. Since then her MS has returned to baseline. She was discharged to home w/ trach. She has since been re-admitted once for DKA. During this stay we saw her  After her name was generated on trach care team. At that point we wondered about decannulation. We attempted occlusion test w/ her #6 cuffless but she had trouble with this so we opted to downsize her to #4 and see her in clinic. Since that time she has been home and doing well. She presents today for possible decannulation.  Past Medical History  Diagnosis Date  . Diabetes mellitus without complication    No family history on file. No Known Allergies History   Social History  . Marital Status: Married    Spouse Name: N/A    Number of Children: N/A  . Years of Education: N/A   Occupational History  . Not on file.   Social History Main Topics  . Smoking status: Former Games developermoker  . Smokeless tobacco: Not on file  . Alcohol Use: No  . Drug Use: No  . Sexual Activity: Not on file   Other Topics Concern  . Not on file   Social History Narrative  . No narrative on file   @[employer] @  Review of Systems:   Bolds are positive  Constitutional: weight loss, gain, night sweats, Fevers, chills, fatigue .  HEENT: headaches, Sore throat, sneezing, nasal congestion, post nasal drip, Difficulty swallowing, Tooth/dental problems, visual complaints visual changes, ear ache CV:  chest pain, radiates: ,Orthopnea, PND, swelling in lower extremities, dizziness, palpitations, syncope.  GI  heartburn, indigestion, abdominal pain, nausea, vomiting, diarrhea, change in bowel habits, loss of appetite, bloody stools.  Resp: cough, productive: , hemoptysis, dyspnea, chest pain, pleuritic.  Skin: rash or itching or icterus GU: dysuria, change in color of  urine, urgency or frequency. flank pain, hematuria  MS: joint pain or swelling. decreased range of motion  Psych: change in mood or affect. depression or anxiety.  Neuro: difficulty with speech, weakness, numbness, ataxia   LMP 02/05/2014 Vital signs  Reviewed    General appearance:  48 Year old female Mouth:  membranes and no mucosal ulcerations; normal hard and soft palate Neck: Trachea midline, # 4 cuffless trach, trach stoma unremarkable  Lungs: CTA, with normal respiratory effort and no intercostal retractions CV: RRR, no MRGs  Abdomen: Soft, non-tender; no masses or HSM Extremities: No peripheral edema or extremity lymphadenopathy Skin: Normal temperature, turgor and texture; no rash, ulcers or subcutaneous nodules Psych: Appropriate affect, alert and oriented to person, place and time  Date  Last Seen by:  Janina Mayorach size Trach change   pmv  Capping  Stoma assessment  Diet  Goals   Oct 2016 Yacoub  6 Yes    Downsized     12/1  4    Clean                 Impression   Tracheostomy status.. Now s/p decannulation.   Plan Wound care discussed w/ pt and husband Dressing change BID till stoma closed No submerging under water.  ROV PRN.   Simonne MartinetPeter E Babcock ACNP-BC [facility]Critical [facility] Pager # [phone] OR # [phone] if no answer  Patient ready for decannulation.  Dressing changes as patient was instructed.  And can be discharged from trach clinic or return PRN.  Patient seen and examined, agree with above note.  I dictated the care and orders written for this patient under my direction.  Alyson ReedyWesam G Yacoub, MD [phone]

## 2014-06-16 NOTE — Progress Notes (Signed)
Tracheostomy Procedure Note  [name] [id number] May 07, 1966  Pre Procedure Tracheostomy Information  Trach Brand: Shiley Size: 4 Style: Uncuffed Secured by: Velcro  Vitals HR-89, RR-18, SpO2-100% on RA, BP-152/85  Procedure: trach care & Decannulated   Post Procedure Evaluation:  Vital signs:blood pressure 133/94, pulse 90, respirations 18 and pulse oximetry 100% Patients current condition: stable Complications: No apparent complications Trach site exam: clean, dry Wound care done: vaseline gauze Patient did tolerate procedure well.   Education: Instructed husband how to change vaseline gauze. Per Anders SimmondsPete Babcock NP change twice a day.  Additional needs: Sent with husband vaseline gauze, regular 4x4 gauze, & a roll of paper tape

## 2014-06-18 ENCOUNTER — Ambulatory Visit (INDEPENDENT_AMBULATORY_CARE_PROVIDER_SITE_OTHER): Payer: Medicaid Other | Admitting: Internal Medicine

## 2014-06-18 ENCOUNTER — Encounter: Payer: Self-pay | Admitting: Internal Medicine

## 2014-06-18 VITALS — BP 118/65 | HR 114 | Temp 98.2°F | Ht 62.0 in | Wt 112.3 lb

## 2014-06-18 DIAGNOSIS — Z794 Long term (current) use of insulin: Secondary | ICD-10-CM

## 2014-06-18 DIAGNOSIS — IMO0002 Reserved for concepts with insufficient information to code with codable children: Secondary | ICD-10-CM

## 2014-06-18 DIAGNOSIS — Z431 Encounter for attention to gastrostomy: Secondary | ICD-10-CM

## 2014-06-18 DIAGNOSIS — E1065 Type 1 diabetes mellitus with hyperglycemia: Secondary | ICD-10-CM

## 2014-06-18 LAB — GLUCOSE, CAPILLARY: GLUCOSE-CAPILLARY: 405 mg/dL — AB (ref 70–99)

## 2014-06-18 MED ORDER — GLUCAGON HCL (RDNA) 1 MG IJ SOLR: 1.0000 mg | Freq: Once | INTRAMUSCULAR | Status: DC | PRN

## 2014-06-18 MED ORDER — INSULIN GLARGINE 100 UNIT/ML ~~LOC~~ SOLN: SUBCUTANEOUS | Status: DC

## 2014-06-18 NOTE — Assessment & Plan Note (Signed)
PEG flushed this morning.
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
## 2014-06-18 NOTE — Assessment & Plan Note (Addendum)
Lab Results  Component Value Date   HGBA1C 9.5* 05/16/2014   HGBA1C 9.0 02/05/2014   HGBA1C 10.6 07/31/2013     Assessment: Diabetes control:  Uncontrolled Progress toward A1C goal:   Stagnant Comments: Patient has as history of multiple admissions for DKA (most recently discharged on 05/29/14), but was recently admitted from clinic for hypoglycemia w/ CBG's in the 30's. During patient's most recent prior admission, she was discharged with Lantus 12 units bid + Novolog 5 units tid. The patient had apparently injected Novolog 12 units for lunch and became profoundly hypoglycemic in the clinic due to this. With more clarification, it seems that she has been using Lantus 5 units bid + Novolog 12 units tid since her discharge. Upon discussion with her former PCP (Dr. Rogelia BogaButcher), the best regimen for the patient is Lantus 7 units BID since the patient has trouble understanding her regimen and has frequent hypo and hyperglycemic events. [name] was discharged home on Lantus 7 units bid only since she cannot manage a more complicated regimen for concern of more severe hypoglycemia.   At today's visit, glucometer log shows CBGs 100s-300s in the morning and afternoon, but 3 hypoglycemic events at nighttime (22>59, 38>60, 26). This was discussed with Dr. Meredith PelJoines and [name].  Plan: Medications:  LANTUS 7 UNITS IN THE MORNING AND 5 UNITS IN THE EVENING Home glucose monitoring: Frequency:  4X DAILY Timing:  BEFORE MEALS AND BEFORE BEDTIME Instruction/counseling given: reminded to bring blood glucose meter & log to each visit Educational resources provided:   Self management tools provided:   Other plans: Patient understands current regimen. We will try new regimen with frequent blood glucose checks and have patient follow up early next week.

## 2014-06-18 NOTE — Patient Instructions (Signed)
General Instructions:   Please bring your medicines with you each time you come to clinic.  Medicines may include prescription medications, over-the-counter medications, herbal remedies, eye drops, vitamins, or other pills.   TAKE LANTUS 7 UNITS IN THE MORNING TAKE LANTUS 5 UNITS IN THE EVENING CHECK BLOOD SUGAR BEFORE EACH MEAL AND BEFORE BEDTIME  FOLLOW UP EARLY NEXT WEEK  Hypoglycemia Hypoglycemia occurs when the glucose in your blood is too low. Glucose is a type of sugar that is your body's main energy source. Hormones, such as insulin and glucagon, control the level of glucose in the blood. Insulin lowers blood glucose and glucagon increases blood glucose. Having too much insulin in your blood stream, or not eating enough food containing sugar, can result in hypoglycemia. Hypoglycemia can happen to people with or without diabetes. It can develop quickly and can be a medical emergency.  CAUSES   Missing or delaying meals.  Not eating enough carbohydrates at meals.  Taking too much diabetes medicine.  Not timing your oral diabetes medicine or insulin doses with meals, snacks, and exercise.  Nausea and vomiting.  Certain medicines.  Severe illnesses, such as hepatitis, kidney disorders, and certain eating disorders.  Increased activity or exercise without eating something extra or adjusting medicines.  Drinking too much alcohol.  A nerve disorder that affects body functions like your heart rate, blood pressure, and digestion (autonomic neuropathy).  A condition where the stomach muscles do not function properly (gastroparesis). Therefore, medicines and food may not absorb properly.  Rarely, a tumor of the pancreas can produce too much insulin. SYMPTOMS   Hunger.  Sweating (diaphoresis).  Change in body temperature.  Shakiness.  Headache.  Anxiety.  Lightheadedness.  Irritability.  Difficulty concentrating.  Dry mouth.  Tingling or numbness in the hands  or feet.  Restless sleep or sleep disturbances.  Altered speech and coordination.  Change in mental status.  Seizures or prolonged convulsions.  Combativeness.  Drowsiness (lethargic).  Weakness.  Increased heart rate or palpitations.  Confusion.  Pale, gray skin color.  Blurred or double vision.  Fainting. DIAGNOSIS  A physical exam and medical history will be performed. Your caregiver may make a diagnosis based on your symptoms. Blood tests and other lab tests may be performed to confirm a diagnosis. Once the diagnosis is made, your caregiver will see if your signs and symptoms go away once your blood glucose is raised.  TREATMENT  Usually, you can easily treat your hypoglycemia when you notice symptoms.  Check your blood glucose. If it is less than 70 mg/dl, take one of the following:   3-4 glucose tablets.    cup juice.    cup regular soda.   1 cup skim milk.   -1 tube of glucose gel.   5-6 hard candies.   Avoid high-fat drinks or food that may delay a rise in blood glucose levels.  Do not take more than the recommended amount of sugary foods, drinks, gel, or tablets. Doing so will cause your blood glucose to go too high.   Wait 10-15 minutes and recheck your blood glucose. If it is still less than 70 mg/dl or below your target range, repeat treatment.   Eat a snack if it is more than 1 hour until your next meal.  There may be a time when your blood glucose may go so low that you are unable to treat yourself at home when you start to notice symptoms. You may need someone to help you. You  may even faint or be unable to swallow. If you cannot treat yourself, someone will need to bring you to the hospital.  HOME CARE INSTRUCTIONS  If you have diabetes, follow your diabetes management plan by:  Taking your medicines as directed.  Following your exercise plan.  Following your meal plan. Do not skip meals. Eat on time.  Testing your blood  glucose regularly. Check your blood glucose before and after exercise. If you exercise longer or different than usual, be sure to check blood glucose more frequently.  Wearing your medical alert jewelry that says you have diabetes.  Identify the cause of your hypoglycemia. Then, develop ways to prevent the recurrence of hypoglycemia.  Do not take a hot bath or shower right after an insulin shot.  Always carry treatment with you. Glucose tablets are the easiest to carry.  If you are going to drink alcohol, drink it only with meals.  Tell friends or family members ways to keep you safe during a seizure. This may include removing hard or sharp objects from the area or turning you on your side.  Maintain a healthy weight. SEEK MEDICAL CARE IF:   You are having problems keeping your blood glucose in your target range.  You are having frequent episodes of hypoglycemia.  You feel you might be having side effects from your medicines.  You are not sure why your blood glucose is dropping so low.  You notice a change in vision or a new problem with your vision. SEEK IMMEDIATE MEDICAL CARE IF:   Confusion develops.  A change in mental status occurs.  The inability to swallow develops.  Fainting occurs. Document Released: 07/03/2005 Document Revised: 07/08/2013 Document Reviewed: 10/30/2011 [facility] Patient Information 2015 [employer], [place]. This information is not intended to replace advice given to you by your health care provider. Make sure you discuss any questions you have with your health care provider.

## 2014-06-19 NOTE — Progress Notes (Signed)
Internal Medicine Clinic Attending  Date of Visit:06/18/2014  I saw and evaluated the patient.  I personally confirmed the key portions of the history and exam documented by Dr. Richardson and I reviewed pertinent patient test results.  The assessment, diagnosis, and plan were formulated together and I agree with the documentation in the resident's note. 

## 2014-06-22 ENCOUNTER — Encounter: Payer: Medicaid Other | Admitting: Dietician

## 2014-06-22 ENCOUNTER — Encounter: Payer: Self-pay | Admitting: Internal Medicine

## 2014-06-22 ENCOUNTER — Ambulatory Visit (INDEPENDENT_AMBULATORY_CARE_PROVIDER_SITE_OTHER): Payer: Medicaid Other | Admitting: Internal Medicine

## 2014-06-22 VITALS — BP 129/77 | HR 99 | Temp 98.2°F | Ht 62.0 in | Wt 115.0 lb

## 2014-06-22 DIAGNOSIS — Z794 Long term (current) use of insulin: Secondary | ICD-10-CM

## 2014-06-22 DIAGNOSIS — E1065 Type 1 diabetes mellitus with hyperglycemia: Secondary | ICD-10-CM

## 2014-06-22 DIAGNOSIS — IMO0002 Reserved for concepts with insufficient information to code with codable children: Secondary | ICD-10-CM

## 2014-06-22 LAB — GLUCOSE, CAPILLARY: GLUCOSE-CAPILLARY: 287 mg/dL — AB (ref 70–99)

## 2014-06-22 MED ORDER — GLUCOSE BLOOD VI STRP: ORAL_STRIP | Status: DC

## 2014-06-22 MED ORDER — INSULIN GLARGINE 100 UNIT/ML ~~LOC~~ SOLN: SUBCUTANEOUS | Status: DC

## 2014-06-22 NOTE — Assessment & Plan Note (Signed)
Assessment: Patient continues to have hypoglycemic events: 29>49 at 0116 on 06/19/14, 43 at 0112 on 06/20/14, and 36 at 0710 on 06/22/14. Patient overcompensates for hypoglycemic episodes with peanut butter and jelly sandwiches, juice and glucagon shots and has resultant hyperglycemia in the afternoon.  Plan: -Lantus 10 units QAM -No PM lantus -Check glucose in the morning, afternoon, and the evening -Follow up in 3-4 days -Counseled on not overcompensating for hypoglycemia
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
## 2014-06-22 NOTE — Patient Instructions (Signed)
General Instructions:  Please bring your medicines with you each time you come to clinic.  Medicines may include prescription medications, over-the-counter medications, herbal remedies, eye drops, vitamins, or other pills.   LANTUS 10 UNITS IN THE MORNING.   PLEASE CHECK YOUR BLOOD SUGAR IN THE MORNING, AFTERNOON AND BEFORE BED.  FOLLOW UP IN 3-4 DAYS  Hypoglycemia Hypoglycemia occurs when the glucose in your blood is too low. Glucose is a type of sugar that is your body's main energy source. Hormones, such as insulin and glucagon, control the level of glucose in the blood. Insulin lowers blood glucose and glucagon increases blood glucose. Having too much insulin in your blood stream, or not eating enough food containing sugar, can result in hypoglycemia. Hypoglycemia can happen to people with or without diabetes. It can develop quickly and can be a medical emergency.  CAUSES   Missing or delaying meals.  Not eating enough carbohydrates at meals.  Taking too much diabetes medicine.  Not timing your oral diabetes medicine or insulin doses with meals, snacks, and exercise.  Nausea and vomiting.  Certain medicines.  Severe illnesses, such as hepatitis, kidney disorders, and certain eating disorders.  Increased activity or exercise without eating something extra or adjusting medicines.  Drinking too much alcohol.  A nerve disorder that affects body functions like your heart rate, blood pressure, and digestion (autonomic neuropathy).  A condition where the stomach muscles do not function properly (gastroparesis). Therefore, medicines and food may not absorb properly.  Rarely, a tumor of the pancreas can produce too much insulin. SYMPTOMS   Hunger.  Sweating (diaphoresis).  Change in body temperature.  Shakiness.  Headache.  Anxiety.  Lightheadedness.  Irritability.  Difficulty concentrating.  Dry mouth.  Tingling or numbness in the hands or feet.  Restless  sleep or sleep disturbances.  Altered speech and coordination.  Change in mental status.  Seizures or prolonged convulsions.  Combativeness.  Drowsiness (lethargic).  Weakness.  Increased heart rate or palpitations.  Confusion.  Pale, gray skin color.  Blurred or double vision.  Fainting. DIAGNOSIS  A physical exam and medical history will be performed. Your caregiver may make a diagnosis based on your symptoms. Blood tests and other lab tests may be performed to confirm a diagnosis. Once the diagnosis is made, your caregiver will see if your signs and symptoms go away once your blood glucose is raised.  TREATMENT  Usually, you can easily treat your hypoglycemia when you notice symptoms.  Check your blood glucose. If it is less than 70 mg/dl, take one of the following:   3-4 glucose tablets.    cup juice.    cup regular soda.   1 cup skim milk.   -1 tube of glucose gel.   5-6 hard candies.   Avoid high-fat drinks or food that may delay a rise in blood glucose levels.  Do not take more than the recommended amount of sugary foods, drinks, gel, or tablets. Doing so will cause your blood glucose to go too high.   Wait 10-15 minutes and recheck your blood glucose. If it is still less than 70 mg/dl or below your target range, repeat treatment.   Eat a snack if it is more than 1 hour until your next meal.  There may be a time when your blood glucose may go so low that you are unable to treat yourself at home when you start to notice symptoms. You may need someone to help you. You may even faint or  be unable to swallow. If you cannot treat yourself, someone will need to bring you to the hospital.  HOME CARE INSTRUCTIONS  If you have diabetes, follow your diabetes management plan by:  Taking your medicines as directed.  Following your exercise plan.  Following your meal plan. Do not skip meals. Eat on time.  Testing your blood glucose regularly. Check  your blood glucose before and after exercise. If you exercise longer or different than usual, be sure to check blood glucose more frequently.  Wearing your medical alert jewelry that says you have diabetes.  Identify the cause of your hypoglycemia. Then, develop ways to prevent the recurrence of hypoglycemia.  Do not take a hot bath or shower right after an insulin shot.  Always carry treatment with you. Glucose tablets are the easiest to carry.  If you are going to drink alcohol, drink it only with meals.  Tell friends or family members ways to keep you safe during a seizure. This may include removing hard or sharp objects from the area or turning you on your side.  Maintain a healthy weight. SEEK MEDICAL CARE IF:   You are having problems keeping your blood glucose in your target range.  You are having frequent episodes of hypoglycemia.  You feel you might be having side effects from your medicines.  You are not sure why your blood glucose is dropping so low.  You notice a change in vision or a new problem with your vision. SEEK IMMEDIATE MEDICAL CARE IF:   Confusion develops.  A change in mental status occurs.  The inability to swallow develops.  Fainting occurs. Document Released: 07/03/2005 Document Revised: 07/08/2013 Document Reviewed: 10/30/2011 [facility] Patient Information 2015 [place], [place]. This information is not intended to replace advice given to you by your health care provider. Make sure you discuss any questions you have with your health care provider.

## 2014-06-23 NOTE — Progress Notes (Signed)
INTERNAL MEDICINE TEACHING ATTENDING ADDENDUM - Nicole LagosNischal Laquana Villari, MD: I personally saw and evaluated [name] in this clinic visit in conjunction with the resident, Dr. Senaida Oresichardson. I have discussed patient's plan of care with medical resident during this visit. I have confirmed the physical exam findings and have read and agree with the clinic note including the plan with the following addition: - pt with hypoglycemic events in AM despite reducing nighttime lantus - Pt also with hyperglycemic events during the day - Would increase lantus in the AM to 10 units and d/c nighttime lantus - Pt to f/u in 3-4 days with blood sugars

## 2014-06-24 ENCOUNTER — Other Ambulatory Visit: Payer: Self-pay | Admitting: Internal Medicine

## 2014-06-24 DIAGNOSIS — E1065 Type 1 diabetes mellitus with hyperglycemia: Secondary | ICD-10-CM

## 2014-06-24 DIAGNOSIS — IMO0002 Reserved for concepts with insufficient information to code with codable children: Secondary | ICD-10-CM

## 2014-06-24 MED ORDER — GLUCOSE BLOOD VI STRP: 1.0000 | ORAL_STRIP | Freq: Three times a day (TID) | Status: DC

## 2014-06-24 NOTE — Addendum Note (Signed)
Addended by: Aldean BakerICHARDSON, ALEXA M on: 06/24/2014 02:11 PM   Modules accepted: Medications

## 2014-06-25 ENCOUNTER — Encounter (HOSPITAL_COMMUNITY): Payer: Self-pay | Admitting: Interventional Cardiology

## 2014-06-29 ENCOUNTER — Ambulatory Visit (INDEPENDENT_AMBULATORY_CARE_PROVIDER_SITE_OTHER): Payer: Medicaid Other | Admitting: Internal Medicine

## 2014-06-29 ENCOUNTER — Encounter: Payer: Self-pay | Admitting: Dietician

## 2014-06-29 ENCOUNTER — Encounter: Payer: Self-pay | Admitting: Internal Medicine

## 2014-06-29 ENCOUNTER — Ambulatory Visit: Payer: Medicaid Other | Admitting: Internal Medicine

## 2014-06-29 VITALS — BP 125/72 | HR 102 | Temp 98.6°F | Ht 62.0 in | Wt 120.0 lb

## 2014-06-29 DIAGNOSIS — F99 Mental disorder, not otherwise specified: Secondary | ICD-10-CM

## 2014-06-29 DIAGNOSIS — D509 Iron deficiency anemia, unspecified: Secondary | ICD-10-CM

## 2014-06-29 DIAGNOSIS — IMO0002 Reserved for concepts with insufficient information to code with codable children: Secondary | ICD-10-CM

## 2014-06-29 DIAGNOSIS — Z431 Encounter for attention to gastrostomy: Secondary | ICD-10-CM

## 2014-06-29 DIAGNOSIS — E1065 Type 1 diabetes mellitus with hyperglycemia: Secondary | ICD-10-CM

## 2014-06-29 LAB — HM DIABETES EYE EXAM

## 2014-06-29 LAB — GLUCOSE, CAPILLARY: GLUCOSE-CAPILLARY: 133 mg/dL — AB (ref 70–99)

## 2014-06-29 MED ORDER — INSULIN GLARGINE 100 UNIT/ML ~~LOC~~ SOLN: SUBCUTANEOUS | Status: DC

## 2014-06-29 MED ORDER — FERROUS SULFATE 325 (65 FE) MG PO TABS: 325.0000 mg | ORAL_TABLET | Freq: Two times a day (BID) | ORAL | Status: DC

## 2014-06-29 MED ORDER — CLONAZEPAM 0.5 MG PO TABS: 0.5000 mg | ORAL_TABLET | Freq: Two times a day (BID) | ORAL | Status: DC

## 2014-06-29 MED ORDER — QUETIAPINE FUMARATE 50 MG PO TABS: 50.0000 mg | ORAL_TABLET | Freq: Two times a day (BID) | ORAL | Status: DC

## 2014-06-29 MED ORDER — GLUCOSE BLOOD VI STRP: 1.0000 | ORAL_STRIP | Freq: Three times a day (TID) | Status: DC
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
## 2014-06-29 NOTE — Assessment & Plan Note (Signed)
Patient no longer uses her PEG tube. She denies dysphagia. She requests referral to physician for removal.  -Will research who placed PEG and refer for removal

## 2014-06-29 NOTE — Progress Notes (Signed)
Patient ID: [name], female   DOB: March 12, 1966, 48 y.o.   MRN: [id number] CDE was asked to see patient today for problem of fluctuating blood sugars from low to high on lantus insulin only. Her low blood sugars are occuring nocturnally for the most part. She denies alcoholic beverages, endorses a bedtime snack nightly and taking her insulin as prescribed. CDE observed patient demonstrate drawing up 4 units of saline using a 30cc syringe. She drew up 3 units at first and then with coaching corrected it. She refuses to use the insulin pen saying they do not work for her.  Log book given to patient and she was asked to record her insulin doses and blood sugars.

## 2014-06-29 NOTE — Assessment & Plan Note (Signed)
Refilled Seroquel and Klonopin.

## 2014-06-29 NOTE — Patient Instructions (Signed)
General Instructions:    Please bring your medicines with you each time you come to clinic.  Medicines may include prescription medications, over-the-counter medications, herbal remedies, eye drops, vitamins, or other pills.   TAKE LANTUS INSULIN 7 UNITS IN THE MORNING AND 4 UNITS AT NIGHT  WE WILL REFER YOU TO ENDOCRINOLOGY FOR DIABETES CONTROL  FOLLOW UP 1-2 WEEKS   Hypoglycemia Hypoglycemia occurs when the glucose in your blood is too low. Glucose is a type of sugar that is your body's main energy source. Hormones, such as insulin and glucagon, control the level of glucose in the blood. Insulin lowers blood glucose and glucagon increases blood glucose. Having too much insulin in your blood stream, or not eating enough food containing sugar, can result in hypoglycemia. Hypoglycemia can happen to people with or without diabetes. It can develop quickly and can be a medical emergency.  CAUSES   Missing or delaying meals.  Not eating enough carbohydrates at meals.  Taking too much diabetes medicine.  Not timing your oral diabetes medicine or insulin doses with meals, snacks, and exercise.  Nausea and vomiting.  Certain medicines.  Severe illnesses, such as hepatitis, kidney disorders, and certain eating disorders.  Increased activity or exercise without eating something extra or adjusting medicines.  Drinking too much alcohol.  A nerve disorder that affects body functions like your heart rate, blood pressure, and digestion (autonomic neuropathy).  A condition where the stomach muscles do not function properly (gastroparesis). Therefore, medicines and food may not absorb properly.  Rarely, a tumor of the pancreas can produce too much insulin. SYMPTOMS   Hunger.  Sweating (diaphoresis).  Change in body temperature.  Shakiness.  Headache.  Anxiety.  Lightheadedness.  Irritability.  Difficulty concentrating.  Dry mouth.  Tingling or numbness in the hands or  feet.  Restless sleep or sleep disturbances.  Altered speech and coordination.  Change in mental status.  Seizures or prolonged convulsions.  Combativeness.  Drowsiness (lethargic).  Weakness.  Increased heart rate or palpitations.  Confusion.  Pale, gray skin color.  Blurred or double vision.  Fainting. DIAGNOSIS  A physical exam and medical history will be performed. Your caregiver may make a diagnosis based on your symptoms. Blood tests and other lab tests may be performed to confirm a diagnosis. Once the diagnosis is made, your caregiver will see if your signs and symptoms go away once your blood glucose is raised.  TREATMENT  Usually, you can easily treat your hypoglycemia when you notice symptoms.  Check your blood glucose. If it is less than 70 mg/dl, take one of the following:   3-4 glucose tablets.    cup juice.    cup regular soda.   1 cup skim milk.   -1 tube of glucose gel.   5-6 hard candies.   Avoid high-fat drinks or food that may delay a rise in blood glucose levels.  Do not take more than the recommended amount of sugary foods, drinks, gel, or tablets. Doing so will cause your blood glucose to go too high.   Wait 10-15 minutes and recheck your blood glucose. If it is still less than 70 mg/dl or below your target range, repeat treatment.   Eat a snack if it is more than 1 hour until your next meal.  There may be a time when your blood glucose may go so low that you are unable to treat yourself at home when you start to notice symptoms. You may need someone to help you.  You may even faint or be unable to swallow. If you cannot treat yourself, someone will need to bring you to the hospital.  HOME CARE INSTRUCTIONS  If you have diabetes, follow your diabetes management plan by:  Taking your medicines as directed.  Following your exercise plan.  Following your meal plan. Do not skip meals. Eat on time.  Testing your blood glucose  regularly. Check your blood glucose before and after exercise. If you exercise longer or different than usual, be sure to check blood glucose more frequently.  Wearing your medical alert jewelry that says you have diabetes.  Identify the cause of your hypoglycemia. Then, develop ways to prevent the recurrence of hypoglycemia.  Do not take a hot bath or shower right after an insulin shot.  Always carry treatment with you. Glucose tablets are the easiest to carry.  If you are going to drink alcohol, drink it only with meals.  Tell friends or family members ways to keep you safe during a seizure. This may include removing hard or sharp objects from the area or turning you on your side.  Maintain a healthy weight. SEEK MEDICAL CARE IF:   You are having problems keeping your blood glucose in your target range.  You are having frequent episodes of hypoglycemia.  You feel you might be having side effects from your medicines.  You are not sure why your blood glucose is dropping so low.  You notice a change in vision or a new problem with your vision. SEEK IMMEDIATE MEDICAL CARE IF:   Confusion develops.  A change in mental status occurs.  The inability to swallow develops.  Fainting occurs. Document Released: 07/03/2005 Document Revised: 07/08/2013 Document Reviewed: 10/30/2011 [facility] Patient Information 2015 [place], [place]. This information is not intended to replace advice given to you by your health care provider. Make sure you discuss any questions you have with your health care provider.

## 2014-06-29 NOTE — Assessment & Plan Note (Signed)
Refilled Ferrous sulfate

## 2014-06-29 NOTE — Assessment & Plan Note (Addendum)
Assessment: Patient presents for follow up for her glucose control. At last visit, Lantus was changed from 7 units in the morning and 5 units in the evening to 10 units only in the morning with hopes to eliminate her hypoglycemic events that occur overnight. Unfortunately, patient presents today and from her glucometer, she continues to have hypoglycemic events overnight and now having hyperglycemic events in the afternoon. Her glucose ranges from 27-504. Patient insists that she only takes what she is prescribed, that she eats regular meals, and that she always eats a bedtime snack. Patient has lost significant weight in the last several months which could be contributing to her sensitivity to insulin.   Plan: -Lantus 7 units QAM -Lantus 4 units QPM -Referral to Endocrinology -Return in 1-2 weeks for recheck -Plan was discussed with patient, [name], and Dr. Criselda PeachesMullen

## 2014-07-01 NOTE — Progress Notes (Signed)
Internal Medicine Clinic Attending  I saw and evaluated the patient.  I personally confirmed the key portions of the history and exam documented by Dr. Senaida Oresichardson and I reviewed pertinent patient test results.  The assessment, diagnosis, and plan were formulated together and I agree with the documentation in the resident's note.  This is a very difficult situation with patient's insulin.  Agree with Dr. Berenda Moraleichardson's plan.

## 2014-07-07 ENCOUNTER — Ambulatory Visit: Payer: Medicaid Other | Admitting: Internal Medicine

## 2014-07-07 ENCOUNTER — Encounter: Payer: Self-pay | Admitting: Internal Medicine

## 2014-07-07 ENCOUNTER — Ambulatory Visit (INDEPENDENT_AMBULATORY_CARE_PROVIDER_SITE_OTHER): Payer: Medicaid Other | Admitting: Internal Medicine

## 2014-07-07 VITALS — BP 90/50 | HR 80 | Temp 98.0°F | Ht 62.0 in | Wt 120.7 lb

## 2014-07-07 DIAGNOSIS — E1065 Type 1 diabetes mellitus with hyperglycemia: Secondary | ICD-10-CM

## 2014-07-07 DIAGNOSIS — I9589 Other hypotension: Secondary | ICD-10-CM

## 2014-07-07 DIAGNOSIS — IMO0002 Reserved for concepts with insufficient information to code with codable children: Secondary | ICD-10-CM

## 2014-07-07 NOTE — Assessment & Plan Note (Signed)
BP Readings from Last 3 Encounters:  07/07/14 90/50  06/29/14 125/72  06/22/14 129/77    Lab Results  Component Value Date   NA 141 06/11/2014   K 4.4 06/11/2014   CREATININE 0.61 06/11/2014    Assessment: Blood pressure control:  Initial reading 97/55 and repeat 90/50. Patient is asymptomatic.  Progress toward BP goal:   Deteriorated  Comments: Patient has been compliant with medications and has not doubled any doses.  Plan: Medications:  continue current medications Other plans: Since this is an isolated event and patient is asymptomatic and requires both BB and ARB for h/o of NSTEMI and DM, we will continue her current medications. If patient develops symptoms, she should call the clinic. We will reassess BP in 1-2 weeks.

## 2014-07-07 NOTE — Patient Instructions (Signed)
General Instructions:  Please bring your medicines with you each time you come to clinic.  Medicines may include prescription medications, over-the-counter medications, herbal remedies, eye drops, vitamins, or other pills.   TAKE LANTUS 7 UNITS IN THE MORNING AND 3 UNITS AT NIGHT  Low Blood Sugar Low blood sugar (hypoglycemia) means that the level of sugar in your blood is lower than it should be. Signs of low blood sugar include:  Getting sweaty.  Feeling hungry.  Feeling dizzy or weak.  Feeling sleepier than normal.  Feeling nervous.  Headaches.  Having a fast heartbeat. Low blood sugar can happen fast and can be an emergency. Your doctor can do tests to check your blood sugar level. You can have low blood sugar and not have diabetes. HOME CARE  Check your blood sugar as told by your doctor. If it is less than 70 mg/dl or as told by your doctor, take 1 of the following:  3 to 4 glucose tablets.   cup clear juice.   cup soda pop, not diet.  1 cup milk.  5 to 6 hard candies.  Recheck blood sugar after 15 minutes. Repeat until it is at the right level.  Eat a snack if it is more than 1 hour until the next meal.  Only take medicine as told by your doctor.  Do not skip meals. Eat on time.  Do not drink alcohol except with meals.  Check your blood glucose before driving.  Check your blood glucose before and after exercise.  Always carry treatment with you, such as glucose pills.  Always wear a medical alert bracelet if you have diabetes. GET HELP RIGHT AWAY IF:   Your blood glucose goes below 70 mg/dl or as told by your doctor, and you:  Are confused.  Are not able to swallow.  Pass out (faint).  You cannot treat yourself. You may need someone to help you.  You have low blood sugar problems often.  You have problems from your medicines.  You are not feeling better after 3 to 4 days.  You have vision changes. MAKE SURE YOU:   Understand these  instructions.  Will watch this condition.  Will get help right away if you are not doing well or get worse. Document Released: 09/27/2009 Document Revised: 09/25/2011 Document Reviewed: 09/27/2009 [facility] Patient Information 2015 [place], [place]. This information is not intended to replace advice given to you by your health care provider. Make sure you discuss any questions you have with your health care provider.

## 2014-07-07 NOTE — Assessment & Plan Note (Addendum)
Lab Results  Component Value Date   HGBA1C 9.5* 05/16/2014   HGBA1C 9.0 02/05/2014   HGBA1C 10.6 07/31/2013     Assessment: Diabetes control:   Uncontrolled Progress toward A1C goal:    Inconsistent Comments: Patient has been seen several times in the last month for follow up for diabetic control with assistance from [place] well. At the last visit one week ago, insulin was changed to 7 units in the morning and 4 units at night in order to help control her hypoglycemic events overnight and hyperglycemic events in the afternoon. On review, her glucose ranges from 28 to 509. However, glucose is improving. She has not had hypoglycemic events in the past 2 days. She did have 4 hypoglycemic events overnight in the first 4 days which resolved and 2 hyperglycemic events in the last 3 days 410, and 509 which resolved. She has been compliant with her medication.   Plan: Medications:  LANTUS 7 UNITS QAM  AND LANTUS 3 UNITS in the afternoon Home glucose monitoring: Frequency:  4 times daily Timing:  Before breakfast, lunch and dinner and before bedtime Instruction/counseling given: reminded to bring blood glucose meter & log to each visit, reminded to bring medications to each visit and discussed diet Self management tools provided:  Booklet Other plans: Referral has been placed to Endocrinology. Follow up in 1-2 weeks.
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
## 2014-07-08 NOTE — Progress Notes (Signed)
I saw and evaluated the patient.  I personally confirmed the key portions of Dr. Richardson's history and exam and reviewed pertinent patient test results.  The assessment, diagnosis, and plan were formulated together and I agree with the documentation in the resident's note. 

## 2014-07-14 ENCOUNTER — Inpatient Hospital Stay (HOSPITAL_COMMUNITY): Admit: 2014-07-14 | Payer: Medicaid Other

## 2014-07-15 ENCOUNTER — Telehealth: Payer: Self-pay | Admitting: Dietician

## 2014-07-15 ENCOUNTER — Telehealth: Payer: Self-pay | Admitting: *Deleted

## 2014-07-15 NOTE — Telephone Encounter (Signed)
Spoke w/ pt after calling twice, after discussion it is determined that she wants her PEG tube removed not her trach as reported. She will discuss this at her next appt

## 2014-07-15 NOTE — Telephone Encounter (Signed)
Correction to previous note. Patient is asking about removal of her PEG tube, not her trache

## 2014-07-15 NOTE — Telephone Encounter (Signed)
Patient is trying to get in touch with the referral nurse about removal of her trache. Message to be routed to referral coordiantor She reports her blood sugars are up and down and that she is looking forward to seeing a diabetes doctor. She denies other concerns at this time.

## 2014-07-27 ENCOUNTER — Ambulatory Visit: Payer: Medicaid Other | Admitting: Internal Medicine

## 2014-07-27 ENCOUNTER — Telehealth: Payer: Self-pay | Admitting: Internal Medicine

## 2014-07-27 NOTE — Telephone Encounter (Signed)
Rec'd a from call from [facility].  This patient has been scheduled an appointment with [facility] office on 08/05/2014  @ 8:30am.

## 2014-07-29 ENCOUNTER — Encounter: Payer: Self-pay | Admitting: Dietician

## 2014-07-29 ENCOUNTER — Encounter: Payer: Self-pay | Admitting: Internal Medicine

## 2014-07-29 ENCOUNTER — Ambulatory Visit (INDEPENDENT_AMBULATORY_CARE_PROVIDER_SITE_OTHER): Payer: Medicaid Other | Admitting: Internal Medicine

## 2014-07-29 VITALS — BP 124/68 | HR 92 | Temp 98.6°F | Ht 62.0 in | Wt 125.7 lb

## 2014-07-29 DIAGNOSIS — E1065 Type 1 diabetes mellitus with hyperglycemia: Secondary | ICD-10-CM

## 2014-07-29 DIAGNOSIS — IMO0002 Reserved for concepts with insufficient information to code with codable children: Secondary | ICD-10-CM

## 2014-07-29 DIAGNOSIS — Z431 Encounter for attention to gastrostomy: Secondary | ICD-10-CM

## 2014-07-29 LAB — GLUCOSE, CAPILLARY: GLUCOSE-CAPILLARY: 472 mg/dL — AB (ref 70–99)

## 2014-07-29 NOTE — Patient Instructions (Addendum)
Please attend your appointment with GI so that they can take out your PEG tube.  Please continue with the 7 U Lantus in the morning and 4 Units at night. Use your new meter and bring it in to your next appointment.   General Instructions:   Please bring your medicines with you each time you come to clinic.  Medicines may include prescription medications, over-the-counter medications, herbal remedies, eye drops, vitamins, or other pills.   Progress Toward Treatment Goals:  Treatment Goal 06/10/2014  Hemoglobin A1C unchanged  Prevent falls -    Self Care Goals & Plans:  Self Care Goal 07/29/2014  Manage my medications take my medicines as prescribed; bring my medications to every visit; refill my medications on time  Monitor my health keep track of my blood glucose; bring my glucose meter and log to each visit; check my feet daily  Eat healthy foods drink diet soda or water instead of juice or soda; eat more vegetables; eat foods that are low in salt; eat baked foods instead of fried foods  Be physically active find an activity I enjoy  Prevent falls -    Home Blood Glucose Monitoring 06/10/2014  Check my blood sugar 3 times a day  When to check my blood sugar before meals     Care Management & Community Referrals:  Referral 06/10/2014  Referrals made for care management support diabetes educator  Referrals made to community resources -

## 2014-07-29 NOTE — Progress Notes (Signed)
Internal Medicine Clinic Attending  Case discussed with Dr. Mallory at the time of the visit.  We reviewed the resident's history and exam and pertinent patient test results.  I agree with the assessment, diagnosis, and plan of care documented in the resident's note. 

## 2014-07-29 NOTE — Progress Notes (Signed)
Patient ID: Nicole DistanceLenora Bordon, female   DOB: 05/19/1966, 49 y.o.   MRN: [id number] Patient lost blood glucose meter. A sample Accu chek Nano was provided to her today.

## 2014-07-29 NOTE — Progress Notes (Signed)
   Subjective:    Patient ID: [name], female    DOB: 05/06/1966, 49 y.o.   MRN: [id number]  HPI [name] is a 49 yo woman with a history of DM type I (with multiple hospitalizations for hypoglycemia and DKA), microcytic anemia, HLD, CAD, HTN, prior PEG / trach dependence (trach now removed) and polysubstance abuse who is presenting to clinic for diabetes check up and medication refills.   She had two admissions in 05/2014 for her DMI; on the first, she was found to be in DKA while on the second, she was admitted from clinic with a CBG in the 30s. Her insulin regimen has been decreased to 7 U Lantus in thr morning and 4 units Lantus in the evening. She has lost her glucometer, but brought in a handwritten log today that shows sugars in the 90s-110s over the past 2 weeks. She is aware of the symptoms of hypoglycemia (she experiences shakiness).   Her weight is up today at 125.7 (was as low as 108 at the end of 2015), which is encouraging in the context of her recent history.  Review of Systems  Constitutional: Positive for unexpected weight change. Negative for fever, chills, diaphoresis, activity change, appetite change and fatigue.  HENT: Negative.   Eyes: Negative.  Negative for visual disturbance.  Respiratory: Negative.  Negative for chest tightness and shortness of breath.   Cardiovascular: Negative.  Negative for chest pain, palpitations and leg swelling.  Gastrointestinal: Negative for nausea, vomiting and abdominal pain.       Still has PEG tube, but is eating by mouth, No dysphagia.  Endocrine: Negative.  Negative for polyuria.  Genitourinary: Negative.   Musculoskeletal: Negative.  Negative for myalgias and arthralgias.  Skin: Negative for pallor, rash and wound.  Neurological: Negative.  Negative for weakness and headaches.  Psychiatric/Behavioral: Negative.        Objective:   Physical Exam  Constitutional: She is oriented to person, place, and time. She appears  well-developed and well-nourished. No distress.  HENT:  Head: Normocephalic and atraumatic.  Eyes: Conjunctivae and EOM are normal. Pupils are equal, round, and reactive to light.  Neck: Normal range of motion.  Cardiovascular: Normal rate, regular rhythm and normal heart sounds.   No murmur heard. Pulmonary/Chest: Effort normal and breath sounds normal. She has no wheezes.  Abdominal: Soft. Bowel sounds are normal. She exhibits no distension. There is no tenderness.  PEG in place; site clean and dry. Nontender.  Musculoskeletal: She exhibits no edema or tenderness.  Neurological: She is alert and oriented to person, place, and time.  Skin: Skin is warm and dry. No rash noted. She is not diaphoretic. No erythema. No pallor.  Psychiatric: She has a normal mood and affect.          Assessment & Plan:  Please see problem oriented charting for assessment & plan  Nicole ChiquitoJulie Cooper Moroney, MD

## 2014-07-30 NOTE — Assessment & Plan Note (Signed)
Patient does not use her PEG tube and would like it removed. She has experienced no dysphagia. She is unsure who placed the PEG.  - Referral to [facility] for PEG removal - Famotidine not re-prescribed (patient has run out of hospital rx, but is no longer in need of stress ulcer prophylaxis); continue in future if necessary / patient develops symptoms

## 2014-07-30 NOTE — Assessment & Plan Note (Addendum)
Lab Results  Component Value Date   HGBA1C 9.5* 05/16/2014   HGBA1C 9.0 02/05/2014   HGBA1C 10.6 07/31/2013     Assessment: Diabetes control:  uncontrolled Progress toward A1C goal:   inconsistent Comments: Patient looked very good today, but had several recent hospitalizations for her uncontrolled DMI (DKA and hypoglycemia to the 30s). Appears to be in better control today, though her old meter could not be read. Patient's own handwritten log shows sugars in the 90s to 110s over the past 2 weeks. She indicates good understanding of hypoglycemic signs (shakiness).  Plan: Medications:  continue current medications lantus 7 U am and 4 U pm Home glucose monitoring: Frequency:  4 times daily Timing:  before breakfast, lunch and dinner and before bedtime Instruction/counseling given: reminded to bring blood glucose meter & log to each visit Educational resources provided: handout Self management tools provided:  given new glucometer that is readable (patient brought in old one, as she had lost her new one, and we could not read it) Other plans: Has endocrinology referral. Follow up in 3 weeks.

## 2014-08-04 ENCOUNTER — Other Ambulatory Visit: Payer: Self-pay | Admitting: Dietician

## 2014-08-04 DIAGNOSIS — E1065 Type 1 diabetes mellitus with hyperglycemia: Secondary | ICD-10-CM

## 2014-08-04 DIAGNOSIS — D509 Iron deficiency anemia, unspecified: Secondary | ICD-10-CM

## 2014-08-04 DIAGNOSIS — IMO0002 Reserved for concepts with insufficient information to code with codable children: Secondary | ICD-10-CM

## 2014-08-04 MED ORDER — ATORVASTATIN CALCIUM 40 MG PO TABS: 40.0000 mg | ORAL_TABLET | Freq: Every day | ORAL | Status: DC

## 2014-08-04 MED ORDER — LOSARTAN POTASSIUM 25 MG PO TABS: 25.0000 mg | ORAL_TABLET | Freq: Every day | ORAL | Status: DC

## 2014-08-04 MED ORDER — GLUCOSE BLOOD VI STRP: ORAL_STRIP | Status: DC

## 2014-08-04 MED ORDER — ACCU-CHEK FASTCLIX LANCETS MISC: Status: DC

## 2014-08-04 NOTE — Telephone Encounter (Signed)
Patient calls asking for refills. Needs strips to check her blood sugar. Uses [facility] [place], will route to triage and Dr. Beckie Saltsivet to follow up. I told her it looked like she had refills of ferrous sulfate and she asked why the pharmacy says she has to pay for it when she has medicaid.

## 2014-08-06 ENCOUNTER — Encounter: Payer: Self-pay | Admitting: *Deleted

## 2014-08-06 ENCOUNTER — Other Ambulatory Visit: Payer: Self-pay | Admitting: Internal Medicine

## 2014-08-06 ENCOUNTER — Telehealth: Payer: Self-pay | Admitting: *Deleted

## 2014-08-06 DIAGNOSIS — R633 Feeding difficulties, unspecified: Secondary | ICD-10-CM

## 2014-08-06 NOTE — Telephone Encounter (Signed)
[facility] to schedule removal of PEG tube.  Scheduled for Monday 08/10/14 @ 12Noon; arrive @ 1130AM; NPO x 6 hr prior to procedure. Stated need a written order fax to [phone]. Pt was called and informed.  Written order per Dr Leatha GildingMallory faxed to [phone]; AttnVictorino Dike: Jennifer.

## 2014-08-10 ENCOUNTER — Ambulatory Visit (HOSPITAL_COMMUNITY)
Admission: RE | Admit: 2014-08-10 | Discharge: 2014-08-10 | Disposition: A | Discharge status: Home / Self Care | Payer: Medicaid Other | Source: Ambulatory Visit | Attending: Internal Medicine | Admitting: Internal Medicine

## 2014-08-12 ENCOUNTER — Encounter: Payer: Self-pay | Admitting: Internal Medicine

## 2014-08-12 ENCOUNTER — Ambulatory Visit (INDEPENDENT_AMBULATORY_CARE_PROVIDER_SITE_OTHER): Payer: Medicaid Other | Admitting: Internal Medicine

## 2014-08-12 VITALS — BP 116/93 | HR 90 | Temp 97.8°F | Ht 65.0 in | Wt 128.3 lb

## 2014-08-12 DIAGNOSIS — I5022 Chronic systolic (congestive) heart failure: Secondary | ICD-10-CM

## 2014-08-12 DIAGNOSIS — IMO0002 Reserved for concepts with insufficient information to code with codable children: Secondary | ICD-10-CM

## 2014-08-12 DIAGNOSIS — F99 Mental disorder, not otherwise specified: Secondary | ICD-10-CM

## 2014-08-12 DIAGNOSIS — E1065 Type 1 diabetes mellitus with hyperglycemia: Secondary | ICD-10-CM

## 2014-08-12 DIAGNOSIS — Z431 Encounter for attention to gastrostomy: Secondary | ICD-10-CM

## 2014-08-12 LAB — POCT GLYCOSYLATED HEMOGLOBIN (HGB A1C): Hemoglobin A1C: 9.6

## 2014-08-12 LAB — GLUCOSE, CAPILLARY: Glucose-Capillary: 489 mg/dL — ABNORMAL HIGH (ref 70–99)

## 2014-08-12 MED ORDER — GLUCAGON HCL (RDNA) 1 MG IJ SOLR: 1.0000 mg | Freq: Once | INTRAMUSCULAR | Status: DC | PRN
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
## 2014-08-12 NOTE — Patient Instructions (Signed)
General Instructions: We are going to get another test to take a picture of your heart called an Echocardiogram.  I have stopped a number of medications including Aspirin, Atorvastatin, Losartan, Carvedilol.   Please bring your medicines with you each time you come to clinic.  Medicines may include prescription medications, over-the-counter medications, herbal remedies, eye drops, vitamins, or other pills.   Progress Toward Treatment Goals:  Treatment Goal 06/10/2014  Hemoglobin A1C unchanged  Prevent falls -    Self Care Goals & Plans:  Self Care Goal 08/12/2014  Manage my medications take my medicines as prescribed; bring my medications to every visit; refill my medications on time; follow the sick day instructions if I am sick  Monitor my health keep track of my blood glucose; bring my glucose meter and log to each visit; check my feet daily  Eat healthy foods eat more vegetables; eat fruit for snacks and desserts; eat foods that are low in salt; eat baked foods instead of fried foods; eat smaller portions; drink diet soda or water instead of juice or soda  Be physically active find an activity I enjoy  Prevent falls -    Home Blood Glucose Monitoring 06/10/2014  Check my blood sugar 3 times a day  When to check my blood sugar before meals     Care Management & Community Referrals:  Referral 06/10/2014  Referrals made for care management support diabetes educator  Referrals made to community resources -

## 2014-08-13 NOTE — Assessment & Plan Note (Signed)
-   Patient had echo with low EF when she was admited for DKA, subsequent cardiac cath showed non obstructive CAD and ventriculogram showed EF of 45%.  I suspect she does not have systolic CHF and she is now on a complex medical regiment that could harm her (frequent hypoglycemia now on Coreg). - Discontinue ASA, Statin, Coreg, and losartan. - Will repeat an Echo.

## 2014-08-13 NOTE — Progress Notes (Signed)
Internal Medicine Clinic Attending  Case discussed with Dr. Hoffman soon after the resident saw the patient.  We reviewed the resident's history and exam and pertinent patient test results.  I agree with the assessment, diagnosis, and plan of care documented in the resident's note. 

## 2014-08-13 NOTE — Progress Notes (Signed)
Pt aware of 2D echo [facility] 08/21/14 2PM. Stanton Kidneyebra Jozee Hammer RN 08/13/14 9AM

## 2014-08-13 NOTE — Assessment & Plan Note (Signed)
-   Patient has not taken Zoloft in over a month, not at risk for withdraw.  Unclear of indication.\ - Will remain off Zoloft.

## 2014-08-13 NOTE — Assessment & Plan Note (Signed)
-   Discussed case with Dr. Rogelia BogaButcher who knows her well.  Patient is very difficult to manage and severely at risk for severe hypoglycemia.  Will no refer to endocrinology as likely there is little that can be done additionally.  Her CBGs are improved with only one low at 54. - Testing strips and other medications were refilled 1 week ago, I asked her to go to the pharmacy to pick them up and resume testing. - Refilled Glucagon. -Continue Lantus 7in am 4 in pm.

## 2014-08-13 NOTE — Assessment & Plan Note (Signed)
-  Patient missed her appointment for PEG tube removal.  Nursing staff has helped her to get this rescheduled.  She does have some minor discharge and tenderness at the site, will need this out ASAP and reassess.  No indication for ABx at this time.

## 2014-08-13 NOTE — Addendum Note (Signed)
Addended by: Neomia DearPOWERS, Brookie Wayment E on: 08/13/2014 10:55 AM   Modules accepted: Orders

## 2014-08-21 ENCOUNTER — Ambulatory Visit (HOSPITAL_COMMUNITY)
Admission: RE | Admit: 2014-08-21 | Discharge: 2014-08-21 | Disposition: A | Discharge status: Home / Self Care | Payer: Medicaid Other | Source: Ambulatory Visit | Attending: Internal Medicine | Admitting: Internal Medicine

## 2014-08-21 DIAGNOSIS — Z431 Encounter for attention to gastrostomy: Secondary | ICD-10-CM | POA: Diagnosis present

## 2014-08-21 DIAGNOSIS — R633 Feeding difficulties, unspecified: Secondary | ICD-10-CM

## 2014-08-21 DIAGNOSIS — I509 Heart failure, unspecified: Secondary | ICD-10-CM

## 2014-08-21 DIAGNOSIS — E119 Type 2 diabetes mellitus without complications: Secondary | ICD-10-CM | POA: Diagnosis not present

## 2014-08-21 DIAGNOSIS — R4182 Altered mental status, unspecified: Secondary | ICD-10-CM | POA: Insufficient documentation

## 2014-08-21 DIAGNOSIS — I081 Rheumatic disorders of both mitral and tricuspid valves: Secondary | ICD-10-CM | POA: Insufficient documentation

## 2014-08-21 DIAGNOSIS — I5022 Chronic systolic (congestive) heart failure: Secondary | ICD-10-CM

## 2014-08-21 DIAGNOSIS — E785 Hyperlipidemia, unspecified: Secondary | ICD-10-CM | POA: Diagnosis not present

## 2014-08-21 MED ORDER — LIDOCAINE VISCOUS 2 % MT SOLN
OROMUCOSAL | Status: AC
  Filled 2014-08-21: qty 15

## 2014-08-21 NOTE — Progress Notes (Signed)
  Echocardiogram 2D Echocardiogram has been performed.  Arvil ChacoFoster, Asani Mcburney 08/21/2014, 3:02 PM

## 2014-08-27 ENCOUNTER — Telehealth: Payer: Self-pay | Admitting: Dietician

## 2014-08-27 NOTE — Telephone Encounter (Signed)
Patient left message that she needed insulin.  CDE called [facility] and they have refills on both Lantus and Novolog. CDE informed pharmacy that patient was no longer on Novolog. They will get the Lantus ready for [name] to pick up. Patient called and notified.

## 2014-08-28 LAB — HM DIABETES EYE EXAM

## 2014-09-09 ENCOUNTER — Telehealth: Payer: Self-pay | Admitting: Dietician

## 2014-09-09 ENCOUNTER — Other Ambulatory Visit: Payer: Self-pay | Admitting: Dietician

## 2014-09-09 DIAGNOSIS — E1065 Type 1 diabetes mellitus with hyperglycemia: Secondary | ICD-10-CM

## 2014-09-09 DIAGNOSIS — IMO0002 Reserved for concepts with insufficient information to code with codable children: Secondary | ICD-10-CM

## 2014-09-09 MED ORDER — ACCU-CHEK FASTCLIX LANCETS MISC: Status: DC

## 2014-09-09 NOTE — Telephone Encounter (Signed)
Patient left message that she needs a refill on lancets.

## 2014-09-09 NOTE — Telephone Encounter (Signed)
opened in error

## 2014-09-14 ENCOUNTER — Encounter: Payer: Self-pay | Admitting: Internal Medicine

## 2014-09-14 ENCOUNTER — Ambulatory Visit (INDEPENDENT_AMBULATORY_CARE_PROVIDER_SITE_OTHER): Payer: Medicaid Other | Admitting: Internal Medicine

## 2014-09-14 VITALS — BP 135/77 | HR 93 | Temp 98.6°F | Ht 65.0 in | Wt 129.6 lb

## 2014-09-14 DIAGNOSIS — IMO0002 Reserved for concepts with insufficient information to code with codable children: Secondary | ICD-10-CM

## 2014-09-14 DIAGNOSIS — Z794 Long term (current) use of insulin: Secondary | ICD-10-CM

## 2014-09-14 DIAGNOSIS — Z431 Encounter for attention to gastrostomy: Secondary | ICD-10-CM

## 2014-09-14 DIAGNOSIS — E1065 Type 1 diabetes mellitus with hyperglycemia: Secondary | ICD-10-CM

## 2014-09-14 MED ORDER — "INSULIN SYRINGE-NEEDLE U-100 31G X 15/64"" 0.3 ML MISC": Status: DC

## 2014-09-14 NOTE — Patient Instructions (Addendum)
General Instructions: -Start using Lantus 3 units in the evening and 9 units in the morning.  -You have been referred to General Surgery for further evaluation of your PEG tube area. It is very important that you follow up with them.  -Follow up with us in 1 month.   Please bring your medicines with you each time you come to clinic.  Medicines may include prescription medications, over-the-counter medications, herbal remedies, eye drops, vitamins, or other pills.   Progress Toward Treatment Goals:  Treatment Goal 06/10/2014  Hemoglobin A1C unchanged  Prevent falls -    Self Care Goals & Plans:  Self Care Goal 08/12/2014  Manage my medications take my medicines as prescribed; bring my medications to every visit; refill my medications on time; follow the sick day instructions if I am sick  Monitor my health keep track of my blood glucose; bring my glucose meter and log to each visit; check my feet daily  Eat healthy foods eat more vegetables; eat fruit for snacks and desserts; eat foods that are low in salt; eat baked foods instead of fried foods; eat smaller portions; drink diet soda or water instead of juice or soda  Be physically active find an activity I enjoy  Prevent falls -    Home Blood Glucose Monitoring 06/10/2014  Check my blood sugar 3 times a day  When to check my blood sugar before meals     Care Management & Community Referrals:  Referral 06/10/2014  Referrals made for care management support diabetes educator  Referrals made to community resources -

## 2014-09-14 NOTE — Progress Notes (Signed)
   Subjective:    Patient ID: [name], female    DOB: 04-16-1966, 49 y.o.   MRN: [id number]  HPI Ms. Lakeman is a 49 yr old woman with PMH of DM1, HLP, CAD, HTN, who presents for diabetes follow up. In addition she has had a PEG tube since August 2015 placed while she needed ventilator. The PEG tube was removed on 08/21/14 but continues to drain ss fluid. She has been tolerating food per mouth well.  Her blood sugars have been low in the mornings (many 50s) but high in the late afternoons (high 400s). She explains that her mother, her boyfriend, her daughter, always check after her to make sure she is drawing the correct units of insulin.    Review of Systems  Constitutional: Negative for fever, chills, diaphoresis, activity change, appetite change, fatigue and unexpected weight change.  HENT: Negative for congestion.   Respiratory: Negative for cough and shortness of breath.   Cardiovascular: Negative for chest pain and leg swelling.  Gastrointestinal: Negative for abdominal pain.  Genitourinary: Negative for dysuria and difficulty urinating.  Neurological: Negative for dizziness.  Psychiatric/Behavioral: Negative for agitation.       Objective:   Physical Exam  Constitutional: She is oriented to person, place, and time. She appears well-developed and well-nourished. No distress.  Hoarseness   HENT:  Well healed scar on medial neck from previous trach  Cardiovascular: Normal rate.   Pulmonary/Chest: Effort normal. No respiratory distress.  Abdominal: Soft. There is no tenderness.  There is a ~2cm opening at the left upper quadrant, site PEG tube that was removed. There is mild ss drainage. There is no surrounding edema/erythema at the site with no purulent or malodorous discharge.   Musculoskeletal: She exhibits no edema or tenderness.  Neurological: She is alert and oriented to person, place, and time.  Uses a walker to stabilize her gait  Skin: Skin is warm. She is not  diaphoretic.  Nursing note and vitals reviewed.         Assessment & Plan:

## 2014-09-15 ENCOUNTER — Encounter: Payer: Self-pay | Admitting: *Deleted

## 2014-09-15 NOTE — Assessment & Plan Note (Signed)
Lab Results  Component Value Date   HGBA1C 9.6 08/12/2014   HGBA1C 9.5* 05/16/2014   HGBA1C 9.0 02/05/2014     Assessment: Diabetes control:  not controlled Progress toward A1C goal:   not at goal Comments: She is on Lantus 4 units qHS and 7 units in am. Her morning CBGs are in the 50s 1-2 per week. Her late afternoon to early evening CBGs are in the high 400s.   Plan: Medications:  increase morning Lantus to 9 units, decrease night time Lantus to 3 units.  Home glucose monitoring: Frequency:   Timing:   Instruction/counseling given: reminded to bring blood glucose meter & log to each visit Educational resources provided:   Self management tools provided:   Other plans: Follow up in 1 month. Refilled her syringes, lancets, glucagon

## 2014-09-15 NOTE — Assessment & Plan Note (Signed)
She had the PEG tube placed in August 2015 while she was on ventilator/trach. [facility] Sept and starting tolerating PO intake but her PEG was not removed until 08/21/14. She continues to see ss discharge from that site though she has had no abd pain, fever/chills.  The wound appears deep but unlikely to be intraabdominal. There is a concern that it could have fistula/scar tissue and may need surgical intervention.  -Referral to Gen Surgery

## 2014-09-18 NOTE — Progress Notes (Signed)
Internal Medicine Clinic Attending  Case discussed with Dr. Garald BraverKennerly soon after the resident saw the patient.  We reviewed the resident's history and exam and pertinent patient test results.  I agree with the assessment, diagnosis, and plan of care documented in the resident's note. Extreme caution needs exercised with [name]'s insulin regimen as she has freq h/o severe, asymptomatic hypoglycemia to the <20 range. She is not capable of managing her medical issues and so far, has not had family support to do so.

## 2014-09-25 ENCOUNTER — Telehealth: Payer: Self-pay | Admitting: Dietician

## 2014-09-25 ENCOUNTER — Other Ambulatory Visit: Payer: Self-pay | Admitting: Internal Medicine

## 2014-09-25 DIAGNOSIS — Z431 Encounter for attention to gastrostomy: Secondary | ICD-10-CM

## 2014-09-25 DIAGNOSIS — IMO0002 Reserved for concepts with insufficient information to code with codable children: Secondary | ICD-10-CM

## 2014-09-25 DIAGNOSIS — E1065 Type 1 diabetes mellitus with hyperglycemia: Secondary | ICD-10-CM

## 2014-09-25 MED ORDER — INSULIN GLARGINE 100 UNIT/ML ~~LOC~~ SOLN: SUBCUTANEOUS | Status: DC

## 2014-09-25 NOTE — Telephone Encounter (Signed)
Wants to know why no appointment has ben made with [facility] for her. She says there is a little small hole where her PEG tibe was that is  leaking pus and blood, has to change her gauze/pants 2-3 times a day. No fever, no pain, blood sugar 400, [facility] told [facility] she is on 10 units lantus, we have her on 9 units in the morning (se takes at 7 am), and today at 12 pm took her other 4 units of lantus, has been taking lantus differently because her sugar is so high. Has been walking, drinking water to get it down.  Wants [facility] drug store- [facility] updated on her new dose of lantus. CDE read to her reason why there is no appointment for her at [facility]. Told her I would ask referral coordinator and someone will get back to her with an appointment.

## 2014-09-28 NOTE — Telephone Encounter (Signed)
Called patient pre referral coordinator and notified her  That she should be hearing form the wound center because they have been faxed a referral for her.  [name] acknowledged the information saying she Has been taking 2 aspirin (81 mg) 4 hours apart and it has not been relieving her pain from the site which is still leaking pus and blood. Told her i would inform her doctor.

## 2014-10-01 ENCOUNTER — Telehealth: Payer: Self-pay | Admitting: *Deleted

## 2014-10-01 NOTE — Telephone Encounter (Signed)
Agree with plan 

## 2014-10-01 NOTE — Telephone Encounter (Signed)
Pt called [name] and c/o more drainage from old peg site, she states she is having a great deal of pain in that area also, she states it is "pouring" when triage called her back, she states her pain is "so bad". She is ask to go to urg care or ED and is agreeable

## 2014-10-06 ENCOUNTER — Telehealth: Payer: Self-pay | Admitting: Dietician

## 2014-10-06 NOTE — Telephone Encounter (Signed)
Says she needs chemstrps. Called [facility]: they have plenty of refills. Patient needs to call them- patient was informed.  She reports having an appointment April 5th for her wound at the wound center.

## 2014-10-13 ENCOUNTER — Ambulatory Visit (INDEPENDENT_AMBULATORY_CARE_PROVIDER_SITE_OTHER): Payer: Medicaid Other | Admitting: Internal Medicine

## 2014-10-13 ENCOUNTER — Encounter: Payer: Self-pay | Admitting: Internal Medicine

## 2014-10-13 VITALS — BP 122/81 | HR 90 | Temp 97.8°F | Ht 65.0 in | Wt 132.1 lb

## 2014-10-13 DIAGNOSIS — Z431 Encounter for attention to gastrostomy: Secondary | ICD-10-CM

## 2014-10-13 DIAGNOSIS — E1065 Type 1 diabetes mellitus with hyperglycemia: Secondary | ICD-10-CM

## 2014-10-13 DIAGNOSIS — D509 Iron deficiency anemia, unspecified: Secondary | ICD-10-CM

## 2014-10-13 DIAGNOSIS — IMO0002 Reserved for concepts with insufficient information to code with codable children: Secondary | ICD-10-CM

## 2014-10-13 LAB — GLUCOSE, CAPILLARY: Glucose-Capillary: 223 mg/dL — ABNORMAL HIGH (ref 70–99)

## 2014-10-13 MED ORDER — FERROUS SULFATE 325 (65 FE) MG PO TABS: 325.0000 mg | ORAL_TABLET | Freq: Two times a day (BID) | ORAL | Status: DC

## 2014-10-13 MED ORDER — GLUCOSE BLOOD VI STRP: ORAL_STRIP | Status: DC

## 2014-10-13 MED ORDER — "INSULIN SYRINGE-NEEDLE U-100 31G X 15/64"" 0.3 ML MISC": Status: DC

## 2014-10-13 NOTE — Progress Notes (Signed)
   Subjective:    Patient ID: [name], female    DOB: 16-Nov-1965, 49 y.o.   MRN: [id number]  HPI [name] is a 48yo woman with PMHx of uncontrolled Type 1 DM with history of severe hypoglycemia, chronic systolic CHF, and hyperlipidemia who presents today for the following:  1. Uncontrolled Type 1 DM: Last HbA1c 9.6 on 08/12/14. Patient reports she is still having hypoglycemic episodes approximately 2-3 times per month where she wakes up in the middle of the night and has to eat something. She states she is aware of her hypoglycemia and becomes shaky and confused. Per review of her blood glucose meter recordings, she is experiencing low blood sugars from 36-66 (most in 40-50 range) at least 3-4 times per week in the morning. At lunch and dinner times she is having extreme highs with blood sugars ranging from 286- "HI". Majority of blood sugars are in 350-450 range. At her last visit, her Lantus was adjusted to 9 units in the AM and 3 units in the evening. She states she has been doing the 9 units in the AM, but still doing 4 units in the evening. She reports polyuria and polydipsia, but denies blurry vision.  2. PEG Tube Site Infection: Patient reports she has been having blood and pus leaking from her previous PEG tube site since early February when the tube was pulled. She also reports an associated achy pain around the site for which she takes an aspirin a few times a week that alleviates the pain. She denies any fever, chills, nausea, vomiting, diarrhea, dysuria.    Review of Systems General: Denies fever, chills, night sweats, changes in weight, changes in appetite HEENT: Denies headaches, ear pain, changes in vision, rhinorrhea, sore throat CV: Denies CP, palpitations, SOB, orthopnea Pulm: Denies SOB, cough, wheezing GI: Denies nausea, vomiting, diarrhea, constipation, melena, hematochezia GU: Denies dysuria, hematuria Msk: Denies muscle cramps, joint pains Neuro: Denies weakness,  numbness, tingling Skin: Denies rashes, bruising    Objective:   Physical Exam General: sitting up in chair, NAD, pleasant HEENT: [place]/AT, EOMI, mucus membranes moist CV: RRR, no m/g/r Pulm: CTA bilaterally, breaths non-labored  Abd: BS+, soft, non-distended. Small area of previous PEG tube site covered with clean bandage. Removal of the bandage reveals a small amount of white pus, no blood. There is no erythema or tenderness in the area. No fluctuance present.  Ext: warm, no edema, moves all Neuro: alert and oriented x 3, no focal deficits     Assessment & Plan:

## 2014-10-13 NOTE — Patient Instructions (Signed)
It was a pleasure seeing you today, Ms. Brassell.   1. Diabetes - Increase morning Lantus to 11 units - Decrease evening Lantus to 3 units  - Continue checking blood sugars 3 times per day (breakfast, lunch, and dinner) - Please call clinic if blood sugar less than 50 or greater than 350  2. PEG tube site - Please make it to appointment at wound care center on April 5th - Wash daily with warm water and soap  General Instructions:   Please bring your medicines with you each time you come to clinic.  Medicines may include prescription medications, over-the-counter medications, herbal remedies, eye drops, vitamins, or other pills.   Progress Toward Treatment Goals:  Treatment Goal 06/10/2014  Hemoglobin A1C unchanged  Prevent falls -    Self Care Goals & Plans:  Self Care Goal 08/12/2014  Manage my medications take my medicines as prescribed; bring my medications to every visit; refill my medications on time; follow the sick day instructions if I am sick  Monitor my health keep track of my blood glucose; bring my glucose meter and log to each visit; check my feet daily  Eat healthy foods eat more vegetables; eat fruit for snacks and desserts; eat foods that are low in salt; eat baked foods instead of fried foods; eat smaller portions; drink diet soda or water instead of juice or soda  Be physically active find an activity I enjoy  Prevent falls -    Home Blood Glucose Monitoring 06/10/2014  Check my blood sugar 3 times a day  When to check my blood sugar before meals     Care Management & Community Referrals:  Referral 06/10/2014  Referrals made for care management support diabetes educator  Referrals made to community resources -

## 2014-10-14 LAB — MICROALBUMIN / CREATININE URINE RATIO
Creatinine, Urine: 132.7 mg/dL
MICROALB UR: 2.4 mg/dL — AB (ref <2.0)
MICROALB/CREAT RATIO: 18.1 mg/g (ref 0.0–30.0)

## 2014-10-15 NOTE — Assessment & Plan Note (Signed)
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 She has been afebrile and does not have any other systemic symptoms/signs of infection. She had no tenderness on exam and no erythema in the surrounding area. She has an appointment with wound care on April 5th. I do not believe she requires antibiotics at this time. I instructed her to wash the area daily with warm water and soap and to apply neosporin cream. Will follow up wound care clinic's recommendations.

## 2014-10-15 NOTE — Assessment & Plan Note (Addendum)
Lab Results  Component Value Date   HGBA1C 9.6 08/12/2014   HGBA1C 9.5* 05/16/2014   HGBA1C 9.0 02/05/2014     Assessment: Diabetes control: poor control (HgbA1C >9%) Progress toward A1C goal:  unchanged  Plan: Medications:  See below Home glucose monitoring: Frequency: 3 times a day Timing: before breakfast, before lunch, before dinner Instruction/counseling given: reminded to bring blood glucose meter & log to each visit and reminded to bring medications to each visit Self management tools provided: copy of home glucose meter download, home glucose testing supplies  Patient with very difficult to manage diabetes due to history of severe hypoglycemia and DKA episodes. She is still having 3-4 episodes of hypoglycemia in 40-50 range which is concerning. Her blood sugars in the afternoon and evening are at the other extreme with readings in the 400s. She has not done well in the past with short acting insulin due to hypoglycemia and not understanding how to titrate her insulin. Will increase morning Lantus to 11 units and decrease afternoon Lantus to 3 units. I had patient repeat back to me her doses and she understands to call the clinic if her BGs are less than 60 or greater than 350.

## 2014-10-15 NOTE — Progress Notes (Signed)
I saw and evaluated the patient.  I personally confirmed the key portions of Dr. Rivet's history and exam and reviewed pertinent patient test results.  The assessment, diagnosis, and plan were formulated together and I agree with the documentation in the resident's note. 

## 2014-10-20 ENCOUNTER — Encounter (HOSPITAL_BASED_OUTPATIENT_CLINIC_OR_DEPARTMENT_OTHER): Payer: Medicaid Other | Attending: General Surgery

## 2014-10-20 DIAGNOSIS — T8189XA Other complications of procedures, not elsewhere classified, initial encounter: Secondary | ICD-10-CM | POA: Diagnosis present

## 2014-10-20 DIAGNOSIS — S31101A Unspecified open wound of abdominal wall, left upper quadrant without penetration into peritoneal cavity, initial encounter: Secondary | ICD-10-CM | POA: Diagnosis not present

## 2014-10-20 DIAGNOSIS — Y839 Surgical procedure, unspecified as the cause of abnormal reaction of the patient, or of later complication, without mention of misadventure at the time of the procedure: Secondary | ICD-10-CM | POA: Diagnosis not present

## 2014-10-26 ENCOUNTER — Telehealth: Payer: Self-pay | Admitting: Dietician

## 2014-10-26 NOTE — Telephone Encounter (Signed)
Patient left message on Friday at 6:21 PM that her blood sugar was 576, also April 7th at 5:26 Pm her blood sugar was 373.  When CDE called her Monday moring she said: "drank plenty of water , 412 at bedtime on Friday, did not feel bad just dehydrated. 258 Sunday morning. This morning 178 it was good and she is feeling better. She is taking :Lantus 11 in the morning 4 in the afternoon. Confirmed her appointment in May and she was okay waiting until then to see a doctor. No other needs identified.

## 2014-11-11 ENCOUNTER — Telehealth: Payer: Self-pay | Admitting: Dietician

## 2014-11-11 ENCOUNTER — Telehealth: Payer: Self-pay | Admitting: Internal Medicine

## 2014-11-11 NOTE — Telephone Encounter (Signed)
Directions should be Lantus 11 units in AM and 3 units in PM. However, if her blood sugars are doing well with 9 and 3 then can keep doing that.

## 2014-11-11 NOTE — Telephone Encounter (Signed)
Patient says she was told to call us to get a refill on her lantus. She also says the [facility] pharmacy on [address] gave her the incorrect test strips and will not take them back. Patient also expressed that she feels she is doing well with her current insulin regimen.  Called [facility] pharmacy: they have that they dispensed Smartview test strips which are what is on her med list and what she says she uses. They gave her  150 on 10/14/14 They also had refills on her lantus, but old directions ( take 10 units once daily in the am). They updated their directions to " take 9 units in the morning and 3 units at night".

## 2014-11-23 ENCOUNTER — Telehealth: Payer: Self-pay | Admitting: Dietician

## 2014-11-23 NOTE — Telephone Encounter (Signed)
Patient called form her pharmacy for assistance with getting the correct strips for her meter. Both the pharmacist and CDE think patient uses smartview test strips and patient says these are not the correct strips for her meter. Suggested she not purchase the strips she does not think are correct and go home get meter and an empty bottle of strips and go back to pharmacy with them.  Patient agreed.   she also said the pharmacy does not have her correct insulin doses. Will request updated rx be sent to the pharmacy

## 2014-12-15 ENCOUNTER — Ambulatory Visit (INDEPENDENT_AMBULATORY_CARE_PROVIDER_SITE_OTHER): Payer: Medicaid Other | Admitting: Internal Medicine

## 2014-12-15 ENCOUNTER — Encounter: Payer: Self-pay | Admitting: Internal Medicine

## 2014-12-15 VITALS — BP 120/72 | HR 87 | Temp 98.3°F | Ht 65.0 in | Wt 136.6 lb

## 2014-12-15 DIAGNOSIS — E1065 Type 1 diabetes mellitus with hyperglycemia: Secondary | ICD-10-CM | POA: Diagnosis present

## 2014-12-15 DIAGNOSIS — Z9114 Patient's other noncompliance with medication regimen: Secondary | ICD-10-CM

## 2014-12-15 DIAGNOSIS — F99 Mental disorder, not otherwise specified: Secondary | ICD-10-CM

## 2014-12-15 DIAGNOSIS — F1910 Other psychoactive substance abuse, uncomplicated: Secondary | ICD-10-CM

## 2014-12-15 DIAGNOSIS — I5022 Chronic systolic (congestive) heart failure: Secondary | ICD-10-CM

## 2014-12-15 DIAGNOSIS — Z931 Gastrostomy status: Secondary | ICD-10-CM | POA: Diagnosis not present

## 2014-12-15 DIAGNOSIS — E785 Hyperlipidemia, unspecified: Secondary | ICD-10-CM

## 2014-12-15 DIAGNOSIS — Z87898 Personal history of other specified conditions: Secondary | ICD-10-CM | POA: Diagnosis not present

## 2014-12-15 DIAGNOSIS — D509 Iron deficiency anemia, unspecified: Secondary | ICD-10-CM

## 2014-12-15 DIAGNOSIS — Z0000 Encounter for general adult medical examination without abnormal findings: Secondary | ICD-10-CM

## 2014-12-15 DIAGNOSIS — IMO0002 Reserved for concepts with insufficient information to code with codable children: Secondary | ICD-10-CM

## 2014-12-15 DIAGNOSIS — Z794 Long term (current) use of insulin: Secondary | ICD-10-CM | POA: Diagnosis not present

## 2014-12-15 DIAGNOSIS — Z431 Encounter for attention to gastrostomy: Secondary | ICD-10-CM

## 2014-12-15 LAB — POCT GLYCOSYLATED HEMOGLOBIN (HGB A1C): HEMOGLOBIN A1C: 12.4

## 2014-12-15 LAB — GLUCOSE, CAPILLARY: Glucose-Capillary: 311 mg/dL — ABNORMAL HIGH (ref 65–99)

## 2014-12-15 MED ORDER — GLUCOSE BLOOD VI STRP: ORAL_STRIP | Status: DC

## 2014-12-15 MED ORDER — LATANOPROST 0.005 % OP SOLN: 1.0000 [drp] | Freq: Every day | OPHTHALMIC | Status: DC

## 2014-12-15 MED ORDER — FERROUS SULFATE 325 (65 FE) MG PO TABS: 325.0000 mg | ORAL_TABLET | Freq: Two times a day (BID) | ORAL | Status: DC

## 2014-12-15 NOTE — Progress Notes (Signed)
   Subjective:    Patient ID: [name], female    DOB: April 05, 1966, 49 y.o.   MRN: [id number]  HPI [name] is a 49yo woman with PMHx of uncontrolled Type 1 DM with history of severe hypoglycemia, chronic systolic CHF, and hyperlipidemia who presents today for the following:  Uncontrolled Type 1 DM: Last HbA1c 9.6 on 08/12/14. Repeat today is 12.4. Patient was prescribed Lantus 11 units in the AM and 3 units in the PM. I then received a phone call a few weeks later that she was taking 9 units in the AM and 4 units in the PM. Patient states she is currently taking 11 units in the AM and 4 units in the PM. However, upon further questioning she is taking her Lantus at 8 AM and then the second dose at 12 PM. She has a long standing history of medication noncompliance and very difficult to control diabetes. Per blood sugar log she is still having lows about 2 times per week in the morning (low as 41) and having significant highs in the afternoon (300s-high 400s). She states she knows when she is having hypoglycemic symptoms (shaking, sweating).   Review of Systems General: Denies fever, chills, night sweats, changes in weight, changes in appetite HEENT: Denies headaches, ear pain, changes in vision, rhinorrhea, sore throat CV: Denies CP, palpitations, SOB, orthopnea Pulm: Denies SOB, cough, wheezing GI: Denies abdominal pain, nausea, vomiting, diarrhea, constipation, melena, hematochezia GU: Denies dysuria, hematuria, frequency Msk: Denies muscle cramps, joint pains Neuro: Denies weakness, numbness, tingling Skin: Denies rashes, bruising    Objective:   Physical Exam General: middle aged woman sitting up in chair, pleasant, NAD HEENT: [place]/AT, EOMI, sclera anicteric, mucus membranes moist CV: RRR, no m/g/r Pulm: CTA bilaterally, breaths non-labored Abd: BS+, soft, non-tender, non-distended. Her previous PEG tube site has chronic appearing skin changes, no purulent discharge. No erythema or  tenderness to palpation.  Ext: warm, no edema, distal pulses 2+, no ulcers or wound on feet Neuro: alert and oriented x 3, no focal deficits     Assessment & Plan:  Please refer to A&P documentation.

## 2014-12-15 NOTE — Assessment & Plan Note (Signed)
Patient reports she has not used any substances, including cocaine, tobacco, or alcohol in several years. Last UDS that was positive for cocaine was in 2013.

## 2014-12-15 NOTE — Assessment & Plan Note (Signed)
Patient has been off of Zoloft for several months now. She states she is doing well off of this medication and denies any depressive symptoms.  - Continue to monitor

## 2014-12-15 NOTE — Assessment & Plan Note (Signed)
Refilled patient's iron.

## 2014-12-15 NOTE — Assessment & Plan Note (Addendum)
Last echo in Feb 2016 showed EF 55-60%, grade 1 diastolic dysfunction. She does not need to be on HF medications as this will likely cause more harm than benefit (as per Dr. Neita GarnetHoffman's note), especially with a beta blocker masking hypoglycemic symptoms. No evidence of heart failure on exam.

## 2014-12-15 NOTE — Assessment & Plan Note (Signed)
Patient refuses to have referral for colonoscopy or mammogram at this time.

## 2014-12-15 NOTE — Assessment & Plan Note (Signed)
Not on statin as taken off back in Feb 2016 as was thought her complex HF regimen was causing her more harm than benefit. Last LDL 65 in Nov 2015.  - Continue to monitor

## 2014-12-15 NOTE — Patient Instructions (Signed)
It was a pleasure taking care of you, [name].   Diabetes - Start Lantus 12 units at 8AM and 3 units at 6PM - Follow up appointment in 6 weeks   Refills given.  General Instructions:   Please bring your medicines with you each time you come to clinic.  Medicines may include prescription medications, over-the-counter medications, herbal remedies, eye drops, vitamins, or other pills.   Progress Toward Treatment Goals:  Treatment Goal 10/13/2014  Hemoglobin A1C unchanged  Prevent falls -    Self Care Goals & Plans:  Self Care Goal 10/13/2014  Manage my medications take my medicines as prescribed; bring my medications to every visit; refill my medications on time  Monitor my health keep track of my blood glucose; bring my glucose meter and log to each visit  Eat healthy foods -  Be physically active -  Prevent falls -    Home Blood Glucose Monitoring 10/13/2014  Check my blood sugar 3 times a day  When to check my blood sugar before breakfast; before lunch; before dinner     Care Management & Community Referrals:  Referral 06/10/2014  Referrals made for care management support diabetes educator  Referrals made to community resources -

## 2014-12-15 NOTE — Assessment & Plan Note (Addendum)
Lab Results  Component Value Date   HGBA1C 12.4 12/15/2014   HGBA1C 9.6 08/12/2014   HGBA1C 9.5* 05/16/2014     Assessment: Diabetes control: poor control (HgbA1C >9%) Progress toward A1C goal:  deteriorated Comments: Very difficult to control diabetes due to medication noncompliance. Per her blood glucose meter she is at least having less lows in the morning, but still having significantly high blood sugars in the afternoon/evening. I am not surprised her HbA1c has gone up as her afternoon/evening CBGs are in 300s-high 400s. I discovered today that she has been taking her Lantus doses only 4 hours apart. We had a long discussion about having more time in between doses.   Plan: Medications:  Increase AM Lantus to 12 units (8AM) and decrease evening Lantus to 3 units (6 PM) Home glucose monitoring: Frequency: 3 times a day Timing: before breakfast, before lunch, before dinner Instruction/counseling given: reminded to bring blood glucose meter & log to each visit, reminded to bring medications to each visit and discussed foot care Self management tools provided: copy of home glucose meter download Other plans:  - follow up in 6 weeks to assess how blood sugars are doing  - Given refill on test strips

## 2014-12-15 NOTE — Assessment & Plan Note (Signed)
PEG site appears to be healing well. Patient reports she went to the wound clinic and they probed her wound which caused a lot of pus to come out. She notes she has been cleaning the wound daily with a sanitizing cloth. She denies any recent pus drainage. She denies any tenderness.  - Recommended to use neosporin on wound daily to help with healing - Diabetes control will be most beneficial for proper wound healing - Continue to monitor

## 2014-12-16 NOTE — Progress Notes (Signed)
Internal Medicine Clinic Attending  Case discussed with Dr. Rivet at the time of the visit.  We reviewed the resident's history and exam and pertinent patient test results.  I agree with the assessment, diagnosis, and plan of care documented in the resident's note.  

## 2014-12-21 ENCOUNTER — Telehealth: Payer: Self-pay | Admitting: Dietician

## 2014-12-21 NOTE — Telephone Encounter (Signed)
Patient called asking for refills on iron, chemstrips and syringes. CDE informed her that she has refills at her pharmacy for all those things. She then says that her pharmacy is giving her 50 cc syringes.  Spoke to [name] at [facility], he says they are giving her 30 cc syringes and he verified that she has refills on all three items she requested.

## 2015-01-14 ENCOUNTER — Telehealth: Payer: Self-pay | Admitting: *Deleted

## 2015-01-14 ENCOUNTER — Telehealth: Payer: Self-pay | Admitting: Internal Medicine

## 2015-01-14 ENCOUNTER — Other Ambulatory Visit: Payer: Self-pay | Admitting: Dietician

## 2015-01-14 DIAGNOSIS — E1065 Type 1 diabetes mellitus with hyperglycemia: Secondary | ICD-10-CM

## 2015-01-14 DIAGNOSIS — R0602 Shortness of breath: Secondary | ICD-10-CM

## 2015-01-14 DIAGNOSIS — IMO0002 Reserved for concepts with insufficient information to code with codable children: Secondary | ICD-10-CM

## 2015-01-14 MED ORDER — INSULIN GLARGINE 100 UNIT/ML ~~LOC~~ SOLN: SUBCUTANEOUS | Status: DC

## 2015-01-14 MED ORDER — ALBUTEROL SULFATE HFA 108 (90 BASE) MCG/ACT IN AERS
2.0000 | INHALATION_SPRAY | Freq: Four times a day (QID) | RESPIRATORY_TRACT | Status: DC | PRN
Start: 2015-01-14 — End: 2016-02-04

## 2015-01-14 NOTE — Telephone Encounter (Signed)
Pt does not need a Rx.  Refills at pharmacy.

## 2015-01-14 NOTE — Telephone Encounter (Signed)
Pt is asking for refill on Albuterol.  It has been removed from her med list. Do you want to re-enter?

## 2015-01-14 NOTE — Telephone Encounter (Signed)
Spoke with patient, she takes albuterol inhaler as needed. Having some shortness of breath, but not significant.   Also discussed her blood sugars. She denies any lows. Blood sugars have been in 200s-300s in AM and 200s-400s in afternoon. I instructed her to increase Lantus to 13 units in AM and keep 3 units in PM. She understands.

## 2015-01-14 NOTE — Telephone Encounter (Signed)
Patient calling about her Insulin, Test Strips, and her Inhaler.  Patient states she uses the [facility] on [address].

## 2015-01-14 NOTE — Telephone Encounter (Signed)
[name] left a voicemail message asking for insulin, albuterol nebulizer and chemstrips.  Will route note to triage refill

## 2015-01-14 NOTE — Telephone Encounter (Signed)
Refills at pharmacy.

## 2015-01-26 ENCOUNTER — Encounter: Payer: Self-pay | Admitting: Internal Medicine

## 2015-01-26 ENCOUNTER — Ambulatory Visit (INDEPENDENT_AMBULATORY_CARE_PROVIDER_SITE_OTHER): Payer: Medicaid Other | Admitting: Internal Medicine

## 2015-01-26 VITALS — BP 125/78 | HR 76 | Temp 97.8°F | Ht 62.0 in | Wt 139.1 lb

## 2015-01-26 DIAGNOSIS — IMO0002 Reserved for concepts with insufficient information to code with codable children: Secondary | ICD-10-CM

## 2015-01-26 DIAGNOSIS — E1065 Type 1 diabetes mellitus with hyperglycemia: Secondary | ICD-10-CM

## 2015-01-26 DIAGNOSIS — Z794 Long term (current) use of insulin: Secondary | ICD-10-CM | POA: Diagnosis not present

## 2015-01-26 DIAGNOSIS — Z0000 Encounter for general adult medical examination without abnormal findings: Secondary | ICD-10-CM

## 2015-01-26 LAB — GLUCOSE, CAPILLARY: Glucose-Capillary: 316 mg/dL — ABNORMAL HIGH (ref 65–99)

## 2015-01-26 MED ORDER — INSULIN GLARGINE 100 UNIT/ML ~~LOC~~ SOLN: SUBCUTANEOUS | Status: DC

## 2015-01-26 NOTE — Patient Instructions (Signed)
-   You MUST EAT every time you take insulin. Do not go back to sleep after taking insulin without eating first!! - Take Lantus 14 units in AM and 3 units in PM - Bring stool cards to next visit - You must use 1 bowel movement for each card. Each card must be used on a different day. - Continue checking blood sugars 2-3 times per day  General Instructions:   Please bring your medicines with you each time you come to clinic.  Medicines may include prescription medications, over-the-counter medications, herbal remedies, eye drops, vitamins, or other pills.   Progress Toward Treatment Goals:  Treatment Goal 12/15/2014  Hemoglobin A1C deteriorated  Prevent falls -    Self Care Goals & Plans:  Self Care Goal 01/26/2015  Manage my medications take my medicines as prescribed; bring my medications to every visit; refill my medications on time  Monitor my health keep track of my blood glucose; bring my glucose meter and log to each visit; keep track of my blood pressure  Eat healthy foods eat more vegetables; eat foods that are low in salt; eat baked foods instead of fried foods; drink diet soda or water instead of juice or soda  Be physically active find an activity I enjoy  Prevent falls have my vision checked; wear appropriate shoes    Home Blood Glucose Monitoring 12/15/2014  Check my blood sugar 3 times a day  When to check my blood sugar before breakfast; before lunch; before dinner     Care Management & Community Referrals:  Referral 06/10/2014  Referrals made for care management support diabetes educator  Referrals made to community resources -

## 2015-01-27 NOTE — Assessment & Plan Note (Signed)
Patient agreeable to getting mammogram and doing stool cards. She will bring stool cards to next visit.  - Mammogram ordered - f/u stool card results

## 2015-01-27 NOTE — Progress Notes (Signed)
   Subjective:    Patient ID: [name], female    DOB: [birth year], 49 y.o.   MRN: [id number]  HPI [name] is a 49yo woman with PMHx of uncontrolled Type 1 DM with history of severe hypoglycemia, chronic systolic CHF, and hyperlipidemia who presents today for follow up of her type 1 DM.   Please see A&P documentation.    Review of Systems General: Denies fever, chills, night sweats, changes in weight, changes in appetite HEENT: Denies headaches, ear pain, changes in vision, rhinorrhea, sore throat CV: Denies CP, palpitations, SOB, orthopnea Pulm: Denies SOB, cough, wheezing GI: Denies abdominal pain, nausea, vomiting, diarrhea, constipation, melena, hematochezia GU: Denies dysuria, hematuria, frequency Msk: Denies muscle cramps, joint pains Neuro: Denies weakness, numbness, tingling Skin: Denies rashes, bruising    Objective:   Physical Exam General: sitting up in chair, pleasant, NAD HEENT: [place]/AT, EOMI, sclera anicteric, mucus membranes moist CV: RRR, no m/g/r Pulm: CTA bilaterally, breaths non-labored Abd: BS+, soft, non-tender. Old PEG tube site healing well, no erythema or drainage present.  Ext: warm, no edema Neuro: alert and oriented x 3    Assessment & Plan:  Refer to A&P documentation.

## 2015-01-27 NOTE — Assessment & Plan Note (Signed)
Lab Results  Component Value Date   HGBA1C 12.4 12/15/2014   HGBA1C 9.6 08/12/2014   HGBA1C 9.5* 05/16/2014     Assessment: Diabetes control: poor control (HgbA1C >9%) Progress toward A1C goal:  unable to assess Comments: Blood sugars overall have improved. She only had 3 episodes of hypoglycemia (CBGs 50-70) over the past 3 weeks, all in morning. Her hypoglycemic episodes can be attributed to her taking her insulin and then going back to sleep without eating. She reports being able to recognize when she is having low blood sugar. We discussed the importance of always eating when taking her insulin. Afternoon/evening blood sugars still significantly elevated in 280s-400s. She has been compliant with her insulin, taking Lantus 13 units in AM (8 AM) and 3 units in PM (6 PM).   Plan: Medications:  Increase AM Lantus to 14 units. Will keep evening Lantus at 3 units.  Home glucose monitoring: Frequency: 3 times a day Timing: before meals Instruction/counseling given: reminded to bring blood glucose meter & log to each visit and reminded to bring medications to each visit Self management tools provided: copy of home glucose meter download Other plans:  - Patient to follow up in next 2-3 weeks to reassess blood sugars - Recommended to continue testing 2-3 times daily - Check HbA1c at next visit

## 2015-01-28 NOTE — Progress Notes (Signed)
Internal Medicine Clinic Attending  Case discussed with Dr. Rivet soon after the resident saw the patient.  We reviewed the resident's history and exam and pertinent patient test results.  I agree with the assessment, diagnosis, and plan of care documented in the resident's note.  

## 2015-02-04 ENCOUNTER — Ambulatory Visit (HOSPITAL_COMMUNITY)
Admission: RE | Admit: 2015-02-04 | Discharge: 2015-02-04 | Disposition: A | Discharge status: Home / Self Care | Payer: Medicaid Other | Source: Ambulatory Visit | Attending: Internal Medicine | Admitting: Internal Medicine

## 2015-02-04 DIAGNOSIS — Z0000 Encounter for general adult medical examination without abnormal findings: Secondary | ICD-10-CM

## 2015-02-04 DIAGNOSIS — Z1231 Encounter for screening mammogram for malignant neoplasm of breast: Secondary | ICD-10-CM | POA: Diagnosis present

## 2015-02-10 ENCOUNTER — Other Ambulatory Visit (INDEPENDENT_AMBULATORY_CARE_PROVIDER_SITE_OTHER): Payer: Medicaid Other

## 2015-02-10 DIAGNOSIS — Z1211 Encounter for screening for malignant neoplasm of colon: Secondary | ICD-10-CM

## 2015-02-10 DIAGNOSIS — Z0000 Encounter for general adult medical examination without abnormal findings: Secondary | ICD-10-CM

## 2015-02-10 LAB — POC HEMOCCULT BLD/STL (HOME/3-CARD/SCREEN)
Card #2 Fecal Occult Blod, POC: NEGATIVE
Card #3 Fecal Occult Blood, POC: NEGATIVE
FECAL OCCULT BLD: NEGATIVE

## 2015-03-15 ENCOUNTER — Ambulatory Visit (INDEPENDENT_AMBULATORY_CARE_PROVIDER_SITE_OTHER): Payer: Medicaid Other | Admitting: Internal Medicine

## 2015-03-15 ENCOUNTER — Encounter: Payer: Self-pay | Admitting: Internal Medicine

## 2015-03-15 VITALS — BP 136/69 | HR 76 | Temp 98.1°F

## 2015-03-15 DIAGNOSIS — D509 Iron deficiency anemia, unspecified: Secondary | ICD-10-CM | POA: Diagnosis not present

## 2015-03-15 DIAGNOSIS — Z0000 Encounter for general adult medical examination without abnormal findings: Secondary | ICD-10-CM

## 2015-03-15 DIAGNOSIS — E1065 Type 1 diabetes mellitus with hyperglycemia: Secondary | ICD-10-CM

## 2015-03-15 DIAGNOSIS — Z794 Long term (current) use of insulin: Secondary | ICD-10-CM

## 2015-03-15 DIAGNOSIS — IMO0002 Reserved for concepts with insufficient information to code with codable children: Secondary | ICD-10-CM

## 2015-03-15 LAB — POCT GLYCOSYLATED HEMOGLOBIN (HGB A1C): Hemoglobin A1C: 10.3

## 2015-03-15 LAB — GLUCOSE, CAPILLARY: Glucose-Capillary: 391 mg/dL — ABNORMAL HIGH (ref 65–99)

## 2015-03-15 MED ORDER — FERROUS SULFATE 325 (65 FE) MG PO TABS: 325.0000 mg | ORAL_TABLET | Freq: Two times a day (BID) | ORAL | Status: DC

## 2015-03-15 MED ORDER — INSULIN GLARGINE 100 UNIT/ML ~~LOC~~ SOLN: SUBCUTANEOUS | Status: DC

## 2015-03-15 NOTE — Assessment & Plan Note (Addendum)
Lab Results  Component Value Date   HGBA1C 10.3 03/15/2015   HGBA1C 12.4 12/15/2014   HGBA1C 9.6 08/12/2014     Assessment: Diabetes control: poor control (HgbA1C >9%) Progress toward A1C goal:  unchanged Comments: HbA1c improved to 10.3 today compared to last of 12.4. AM blood sugars mostly in 80-140 range, a few in 200s. Evening blood sugars still significantly elevated in 300s-400s. Patient is taking Lantus 14 units in AM and 4 units in PM. She denies any low blood sugars. I see only about 1 low per week per her glucometer readings. Lows only in 110s.   Plan: Medications:  Increase AM Lantus to 15 units and continue Lantus 4 units in PM. Have to titrate slowly due to hx of severe hypoglycemia.  Home glucose monitoring: Frequency: 3 times a day Timing: before meals Instruction/counseling given: reminded to bring blood glucose meter & log to each visit, reminded to bring medications to each visit and discussed diet Other plans:  - Unclear why her evening blood sugars remain significantly elevated. I asked about her diet and she denied any changes. She has met with Butch Penny before but may benefit from seeing her again soon.  - f/u in 2-3 weeks to reassess blood sugars

## 2015-03-15 NOTE — Progress Notes (Signed)
   Subjective:    Patient ID: [name], female    DOB: Dec 12, 1965, 49 y.o.   MRN: [id number]  HPI [name] is a 49yo woman with PMHx of uncontrolled Type 1 DM with history of severe hypoglycemia, chronic systolic CHF, and hyperlipidemia who presents today for follow up of her type 1 DM.   Please refer to A&P documentation.    Review of Systems General: Reports polydipsia. Denies fever, chills, night sweats, changes in weight, changes in appetite HEENT: Denies headaches, ear pain, changes in vision, rhinorrhea, sore throat CV: Denies CP, palpitations, SOB, orthopnea Pulm: Denies SOB, cough, wheezing GI: Denies abdominal pain, nausea, vomiting, diarrhea, constipation, melena, hematochezia GU: Reports polyuria. Denies dysuria, hematuria Msk: Denies muscle cramps, joint pains Neuro: Denies weakness, numbness, tingling Skin: Denies rashes, bruising    Objective:   Physical Exam General: alert, sitting up in chair, NAD HEENT: [place]/AT, EOMI, sclera anicteric, mucus membranes moist CV: RRR, no m/g/r Pulm: CTA bilaterally, breaths non-labored Abd: BS+, soft, non-tender Ext: warm, no edema Neuro: alert and oriented x 3    Assessment & Plan:  Please refer to A&P documentation.

## 2015-03-15 NOTE — Patient Instructions (Signed)
-   Increase Lantus to 15 units in AM - Continue Lantus 4 units in PM - Follow up in 2 weeks to reassess blood sugars  - Please call if any low blood sugars  General Instructions:   Thank you for bringing your medicines today. This helps [place] keep you safe from mistakes.   Progress Toward Treatment Goals:  Treatment Goal 01/26/2015  Hemoglobin A1C unable to assess  Prevent falls -    Self Care Goals & Plans:  Self Care Goal 01/26/2015  Manage my medications take my medicines as prescribed; bring my medications to every visit; refill my medications on time  Monitor my health keep track of my blood glucose; bring my glucose meter and log to each visit; keep track of my blood pressure  Eat healthy foods eat more vegetables; eat foods that are low in salt; eat baked foods instead of fried foods; drink diet soda or water instead of juice or soda  Be physically active find an activity I enjoy  Prevent falls have my vision checked; wear appropriate shoes    Home Blood Glucose Monitoring 01/26/2015  Check my blood sugar 3 times a day  When to check my blood sugar before meals     Care Management & Community Referrals:  Referral 06/10/2014  Referrals made for care management support diabetes educator  Referrals made to community resources -

## 2015-03-15 NOTE — Assessment & Plan Note (Signed)
Stool cards negative x 3. Mammogram negative.

## 2015-03-15 NOTE — Assessment & Plan Note (Signed)
Refilled her iron.

## 2015-03-17 NOTE — Progress Notes (Signed)
Internal Medicine Clinic Attending  Case discussed with Dr. Rivet soon after the resident saw the patient.  We reviewed the resident's history and exam and pertinent patient test results.  I agree with the assessment, diagnosis, and plan of care documented in the resident's note.  

## 2015-03-30 ENCOUNTER — Telehealth: Payer: Self-pay | Admitting: *Deleted

## 2015-03-30 ENCOUNTER — Encounter: Payer: Self-pay | Admitting: *Deleted

## 2015-03-30 NOTE — Telephone Encounter (Signed)
Opened in error

## 2015-05-03 ENCOUNTER — Ambulatory Visit (INDEPENDENT_AMBULATORY_CARE_PROVIDER_SITE_OTHER): Payer: Medicaid Other | Admitting: Internal Medicine

## 2015-05-03 ENCOUNTER — Encounter: Payer: Self-pay | Admitting: Internal Medicine

## 2015-05-03 VITALS — BP 132/59 | HR 78 | Temp 98.2°F | Ht 62.0 in | Wt 140.0 lb

## 2015-05-03 DIAGNOSIS — E1065 Type 1 diabetes mellitus with hyperglycemia: Secondary | ICD-10-CM

## 2015-05-03 DIAGNOSIS — E103399 Type 1 diabetes mellitus with moderate nonproliferative diabetic retinopathy without macular edema, unspecified eye: Secondary | ICD-10-CM

## 2015-05-03 DIAGNOSIS — IMO0002 Reserved for concepts with insufficient information to code with codable children: Secondary | ICD-10-CM

## 2015-05-03 DIAGNOSIS — Z794 Long term (current) use of insulin: Secondary | ICD-10-CM | POA: Diagnosis not present

## 2015-05-03 DIAGNOSIS — H6121 Impacted cerumen, right ear: Secondary | ICD-10-CM

## 2015-05-03 LAB — GLUCOSE, CAPILLARY: Glucose-Capillary: 373 mg/dL — ABNORMAL HIGH (ref 65–99)

## 2015-05-03 NOTE — Patient Instructions (Signed)
-   Increase AM Lantus to 16 units - Continue PM Lantus 5 units - Continue checking your blood sugar 3 times daily - Don't forget your blood glucose meter next visit! - Follow up in 1 month  General Instructions:   Please bring your medicines with you each time you come to clinic.  Medicines may include prescription medications, over-the-counter medications, herbal remedies, eye drops, vitamins, or other pills.   Progress Toward Treatment Goals:  Treatment Goal 03/15/2015  Hemoglobin A1C unchanged  Prevent falls -    Self Care Goals & Plans:  Self Care Goal 05/03/2015  Manage my medications take my medicines as prescribed; bring my medications to every visit  Monitor my health keep track of my blood glucose; bring my glucose meter and log to each visit  Eat healthy foods drink diet soda or water instead of juice or soda; eat more vegetables; eat foods that are low in salt; eat baked foods instead of fried foods; eat fruit for snacks and desserts  Be physically active find an activity I enjoy  Prevent falls wear appropriate shoes  Meeting treatment goals maintain the current self-care plan    Home Blood Glucose Monitoring 03/15/2015  Check my blood sugar 3 times a day  When to check my blood sugar before meals     Care Management & Community Referrals:  Referral 06/10/2014  Referrals made for care management support diabetes educator  Referrals made to community resources -

## 2015-05-04 DIAGNOSIS — H6121 Impacted cerumen, right ear: Secondary | ICD-10-CM | POA: Insufficient documentation

## 2015-05-04 NOTE — Progress Notes (Signed)
   Subjective:    Patient ID: [name], female    DOB: [birth year], 49 y.o.   MRN: [id number]  HPI [name] is a 49yo woman with PMHx of uncontrolled type 1 DM, chronic systolic CHF, and hyperlipidemia who presents today for follow up of her type 1 DM.  At her last visit her HbA1c had improved from 12.4 to 10.3. She was instructed to increase her Lantus to 15 units in the AM and 4 units in the PM. She reports she has been taking 15 units in AM and 5 units in PM. She forgot her glucometer today, but reports AM blood sugars in the 100-150 range and PM blood sugars in the 300-400 range. She notes 1 low of 58 in the morning.  She also complains of decreased hearing on the right side that started 2 weeks ago. She states "it feels like something is in there." She denies any associated pain, discharge, tinnitus, headache, or facial pain.     Review of Systems General: Denies fever, chills, night sweats, changes in weight, changes in appetite HEENT: Denies changes in vision, rhinorrhea, sore throat CV: Denies CP, palpitations, SOB, orthopnea Pulm: Denies SOB, cough, wheezing GI: Denies abdominal pain, nausea, vomiting, diarrhea, constipation, melena, hematochezia GU: Denies dysuria, hematuria, frequency Msk: Denies muscle cramps, joint pains Neuro: Denies weakness, numbness, tingling Skin: Denies rashes, bruising Psych: Denies depression, anxiety, hallucinations    Objective:   Physical Exam General: alert, sitting up in chair, NAD HEENT: [place]/AT, EOMI, sclera anicteric, left ear canal appears normal, right ear canal has a large amount of ear wax present that is blocking half of the ear canal.  CV: RRR, no m/g/r Pulm: CTA bilaterally, breaths non-labored Abd: BS+, soft, non-tender. A well healed PEG site scar is present. Ext: warm, no peripheral edema, no sores present on feet  Neuro: alert and oriented x 3     Assessment & Plan:  Please refer to A&P documentation.

## 2015-05-04 NOTE — Assessment & Plan Note (Signed)
Patient with excessive cerumen in her right ear canal causing conductive hearing loss. Her symptoms were relieved by rinsing out her ear. An excessive amount of ear wax was obtained.

## 2015-05-04 NOTE — Assessment & Plan Note (Signed)
Lab Results  Component Value Date   HGBA1C 10.3 03/15/2015   HGBA1C 12.4 12/15/2014   HGBA1C 9.6 08/12/2014     Assessment: Diabetes control: poor control (HgbA1C >9%) Progress toward A1C goal:  unchanged Comments: AM blood sugars much improved in 100-150 range. However, she did have 1 low blood sugar at 58. Given her history of hypoglycemia will have to decrease evening dose to prevent lows. Afternoon and evening blood sugars elevated in 300s-400s but expected given she cannot have prandial insulin.  Plan: Medications:  Increase AM Lantus to 16 units and decrease PM Lantus to 4 units. Home glucose monitoring: Frequency: 3 times a day Timing: before meals Instruction/counseling given: Reminded to bring glucometer and medications to every visit. Self management tools provided: copy of home glucose meter download Other plans:  - Obtain HbA1c next visit - Follow up in 1 month to reassess blood sugars

## 2015-05-05 NOTE — Progress Notes (Signed)
Internal Medicine Clinic Attending  Case discussed with Dr. Rivet soon after the resident saw the patient.  We reviewed the resident's history and exam and pertinent patient test results.  I agree with the assessment, diagnosis, and plan of care documented in the resident's note.  

## 2015-05-20 ENCOUNTER — Telehealth: Payer: Self-pay | Admitting: Dietician

## 2015-05-20 NOTE — Telephone Encounter (Signed)
Patient left a voicemail that she is having trouble with her blood sugar meter. She wants an appointment tomorrow. CDE left her a message with two choices of times for an appointment.

## 2015-05-21 ENCOUNTER — Ambulatory Visit (INDEPENDENT_AMBULATORY_CARE_PROVIDER_SITE_OTHER): Payer: Medicaid Other | Admitting: Dietician

## 2015-05-21 ENCOUNTER — Encounter: Payer: Self-pay | Admitting: Dietician

## 2015-05-21 ENCOUNTER — Other Ambulatory Visit: Payer: Self-pay | Admitting: Internal Medicine

## 2015-05-21 DIAGNOSIS — IMO0002 Reserved for concepts with insufficient information to code with codable children: Secondary | ICD-10-CM

## 2015-05-21 DIAGNOSIS — E1065 Type 1 diabetes mellitus with hyperglycemia: Secondary | ICD-10-CM

## 2015-05-21 DIAGNOSIS — E103399 Type 1 diabetes mellitus with moderate nonproliferative diabetic retinopathy without macular edema, unspecified eye: Secondary | ICD-10-CM

## 2015-05-21 DIAGNOSIS — Z713 Dietary counseling and surveillance: Secondary | ICD-10-CM

## 2015-05-21 DIAGNOSIS — Z794 Long term (current) use of insulin: Secondary | ICD-10-CM

## 2015-05-21 NOTE — Patient Instructions (Signed)
For breakfast Try to eat oatmeal with 1 teaspoon of cinnamon in it or eggs  instead of cheerios, with cheese, peaut butter or sometimes spam or bologna half sandwich or  Toast.   Lets try not eating cold cereal starting today until your next appointment to see if this will lower your afternoon blood sugar reading  Try to eat more beans like pintos and try barley instead of rice or spaghetti for lunch and supper   Low carb foods:   peanut butter, light greek yogurt  Eat more fish- whiting, tilapia, salmon, a little bit of tuna salad   [place], cheese, chicken, egg salad, deviled eggs, ham  Use Olive oil and canola oil to season your foods  Vegetables- season with olive oil, okra,

## 2015-05-21 NOTE — Progress Notes (Signed)
Diabetes Self-Management Education  Visit Type:  Follow-up  Appt. Start Time: 1350 Appt. End Time: 1430  05/21/2015  [name], identified by name and date of birth, is a 49 y.o. female with a diagnosis of Diabetes Type 1 diabetes:  .   ASSESSMENT  Last menstrual period 04/30/2015. There is no weight on file to calculate BMI.       Diabetes Self-Management Education - 05/21/15 1600    Health Coping   How would you rate your overall health? Good   Psychosocial Assessment   Patient Belief/Attitude about Diabetes Motivated to manage diabetes   Self-care barriers Lack of transportation;Lack of material resources   Self-management support Doctor's office;Family;CDE visits   Patient Concerns Monitoring;Glycemic Control   Special Needs Simplified materials   Preferred Learning Style Auditory;Visual   Learning Readiness Ready   Complications   Last HgB A1C per patient/outside source 10.3 %   How often do you check your blood sugar? 1-2 times/day   Fasting Blood glucose range (mg/dL) 098-119;>147   Postprandial Blood glucose range (mg/dL) >829   Number of hypoglycemic episodes per month 1   Can you tell when your blood sugar is low? Yes   What do you do if your blood sugar is low? eat or dirnk and test   Number of hyperglycemic episodes per week 55   Can you tell when your blood sugar is high? Yes   What do you do if your blood sugar is high? walks, drinks water, soemtimes takes extra insulin   Have you had a dilated eye exam in the past 12 months? Yes   Are you checking your feet? Yes   How many days per week are you checking your feet? 7   Dietary Intake   Breakfast cheerios or rice krispies, skim milk and 1/2 spam sandwich   Lunch peanut butter or bolgna sandwich, a little cranberry juice beets,   Dinner spaghetti or bologna sandwich, diet peaches, vegetables, water   Snack (evening) 1/23 pack oodles of noodles and skim milk   Beverage(s) skim milk, water, a little  juice   Exercise   Exercise Type ADL's;Light (walking / raking leaves)   How many days per week to you exercise? --  30 minnutes a day   Patient Education   Previous Diabetes Education Yes (please comment)   Nutrition management  Role of diet in the treatment of diabetes and the relationship between the three main macronutrients and blood glucose level;Meal options for control of blood glucose level and chronic complications.   Individualized Goals (developed by patient)   Nutrition Follow meal plan discussed   Outcomes   Program Status Re-entered- annual review   Subsequent Visit   Since your last visit have you continued or begun to take your medications as prescribed? No   Since your last visit have you had your blood pressure checked? Yes   Is your most recent blood pressure lower, unchanged, or higher since your last visit? Unchanged   Since your last visit have you experienced any weight changes? Gain   Weight Gain (lbs) 8 # recently she has gained ~ 20#  in past year   Since your last visit, are you checking your blood glucose at least once a day? Yes      Learning Objective:  Patient will have a greater understanding of diabetes self-management. Patient education plan: nutrition to assist with glycemic control.   Plan:   Patient Instructions  For breakfast Try to eat  oatmeal with 1 teaspoon of cinnamon in it or eggs  instead of cheerios, with cheese, peaut butter or sometimes spam or bologna half sandwich or  Toast.   Lets try not eating cold cereal starting today until your next appointment to see if this will lower your afternoon blood sugar reading  Try to eat more beans like pintos and try barley instead of rice or spaghetti for lunch and supper   Low carb foods:   peanut butter, light greek yogurt  Eat more fish- whiting, tilapia, salmon, a little bit of tuna salad   [place], cheese, chicken, egg salad, deviled eggs, ham  Use Olive oil and canola oil to  season your foods  Vegetables- season with olive oil, okra,       Expected Outcomes:  Demonstrated interest in learning. Expect positive outcomes Education material provided: My Plate If problems or questions, patient to contact team via:  Phone Future DSME appointment: - 2 wks

## 2015-06-08 ENCOUNTER — Encounter: Payer: Self-pay | Admitting: Dietician

## 2015-06-08 ENCOUNTER — Encounter: Payer: Self-pay | Admitting: Internal Medicine

## 2015-06-08 ENCOUNTER — Ambulatory Visit (INDEPENDENT_AMBULATORY_CARE_PROVIDER_SITE_OTHER): Payer: Medicaid Other | Admitting: Internal Medicine

## 2015-06-08 ENCOUNTER — Ambulatory Visit (INDEPENDENT_AMBULATORY_CARE_PROVIDER_SITE_OTHER): Payer: Medicaid Other | Admitting: Dietician

## 2015-06-08 VITALS — BP 125/68 | HR 95 | Temp 98.3°F | Wt 143.4 lb

## 2015-06-08 DIAGNOSIS — IMO0002 Reserved for concepts with insufficient information to code with codable children: Secondary | ICD-10-CM

## 2015-06-08 DIAGNOSIS — E103399 Type 1 diabetes mellitus with moderate nonproliferative diabetic retinopathy without macular edema, unspecified eye: Secondary | ICD-10-CM

## 2015-06-08 DIAGNOSIS — Z713 Dietary counseling and surveillance: Secondary | ICD-10-CM

## 2015-06-08 DIAGNOSIS — E1065 Type 1 diabetes mellitus with hyperglycemia: Secondary | ICD-10-CM | POA: Diagnosis not present

## 2015-06-08 DIAGNOSIS — Z794 Long term (current) use of insulin: Secondary | ICD-10-CM

## 2015-06-08 LAB — GLUCOSE, CAPILLARY: Glucose-Capillary: 387 mg/dL — ABNORMAL HIGH (ref 65–99)

## 2015-06-08 LAB — POCT GLYCOSYLATED HEMOGLOBIN (HGB A1C): Hemoglobin A1C: 10.7

## 2015-06-08 MED ORDER — GLUCAGON (RDNA) 1 MG IJ KIT: 1.0000 mg | PACK | Freq: Once | INTRAMUSCULAR | Status: DC | PRN

## 2015-06-08 MED ORDER — METFORMIN HCL 500 MG PO TABS: 500.0000 mg | ORAL_TABLET | Freq: Every day | ORAL | Status: DC

## 2015-06-08 MED ORDER — GLUCOSE 40 % PO GEL: 1.0000 | ORAL | Status: DC | PRN

## 2015-06-08 NOTE — Progress Notes (Signed)
Diabetes Self-Management Education  Visit Type:  Follow-up  Appt. Start Time: 200 Appt. End Time: 230  06/08/2015  [name], identified by name and date of birth, is a 49 y.o. female with a diagnosis of Diabetes:Type 1 diabetes she is here with a daughter who is supportive  .   ASSESSMENT  A1C is higher at 10.7%, but in past 2 weeks blood sugars are better,  Blood sugars are significantly decreased after her last visit form an average oof 350 to 257 after changing her food and insulin doses BMI is 26  weight - 143 goal weight is ~ 130-135#.         Diabetes Self-Management Education - 06/08/15 1400    Health Coping   How would you rate your overall health? Good   Complications   Last HgB A1C per patient/outside source 10.7 %   How often do you check your blood sugar? 3-4 times/day   Fasting Blood glucose range (mg/dL) <02;72-536;644-034;742-595   Postprandial Blood glucose range (mg/dL) >638   Dietary Intake   Breakfast oatmeal with cinnamin   Snack (morning) yogurt   Patient Education   Previous Diabetes Education Yes- here   Nutrition management  Role of diet in the treatment of diabetes and the relationship between the three main macronutrients and blood glucose level   Individualized Goals (developed by patient)   Nutrition Follow meal plan discussed   Outcomes   Program Status Re-entered   Subsequent Visit   Since your last visit have you continued or begun to take your medications as prescribed? Yes   Since your last visit have you had your blood pressure checked? Yes   Is your most recent blood pressure lower, unchanged, or higher since your last visit? Unchanged   Since your last visit have you experienced any weight changes? Gain   Weight Gain (lbs) 3   Since your last visit, are you checking your blood glucose at least once a day? Yes      Learning Objective:  Patient will have a greater understanding of diabetes self-management. Patient education plan  is to review nutrition to assist with decreased blood sugars and weight gain.  My plan to support myself in continuing these changes to care for my diabetes is to attend or contact:   Diabetes Support Groups ? Type 1 diabetes support group Type 2 diabetes support group : 2nd Monday of every month from 6-7 PM at [address] [facility] conference room [phone] Other ? Add other local support resources -doctor's office, CDE, Dietitian, pharmacist, church   Plan:   Patient Instructions  For breakfast Try to eat oatmeal with 1 teaspoon of cinnamon in it or eggs instead of cheerios, with cheese, peaut butter or sometimes spam or bologna half sandwich or Toast.   Your blood sugars are 100 points less since you began  Eating oatmeal and cinnamon instead of cold cereal!!!  Keep eating beans like pintos and try barley instead of rice or spaghetti for lunch and supper   Low carb foods:   peanut butter, light greek yogurt  Eat more fish- whiting, tilapia, salmon, a little bit of tuna salad   [place], cheese, chicken, egg salad, deviled eggs, ham  Use Olive oil and canola oil to season your foods  Vegetables- season with olive oil, okra,    Addendum_ patient demonstrated excellent skills at drawing up 16 units of saline with a vial and syringe today  Expected Outcomes:  Demonstrated interest  in learning. Expect positive outcomes Education material provided: Meal plan card If problems or questions, patient to contact team via:  Phone Future DSME appointment: - 3-4 months

## 2015-06-08 NOTE — Progress Notes (Signed)
   Subjective:    Patient ID: [name], female    DOB: 01/14/1966, 49 y.o.   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  She reports taking Lantus 16 units in the AM and 5 units in the PM. We had actually decreased her evening Lantus to 4 units at her last visit 2 weeks ago. She reports an episode of hypoglycemia yesterday at 753 and then another episode a few weeks ago. However per review of her glucometer she is actually having hypoglycemia about twice per week (lowest 49) in the mornings. She reports her blood sugars are still elevated in the 200-400 range in the evenings as expected. Her last HbA1c was 10.3 and today is 10.7. She is requesting a refill on glucagon today. She reports having to use it a few weeks ago.    Review of Systems General: Denies fever, chills, night sweats, changes in weight, changes in appetite HEENT: Denies headaches, ear pain, changes in vision, rhinorrhea, sore throat CV: Denies CP, palpitations, SOB, orthopnea Pulm: Denies SOB, cough, wheezing GI: Denies abdominal pain, nausea, vomiting, diarrhea, constipation, melena, hematochezia GU: Denies dysuria, hematuria, frequency Msk: Denies muscle cramps, joint pains Neuro: Denies weakness, numbness, tingling Skin: Denies rashes, bruising Psych: Denies depression, anxiety, hallucinations    Objective:   Physical Exam General: alert, sitting up, NAD, pleasant  HEENT: [place]/AT, EOMI, sclera anicteric, mucus membranes moist CV: RRR, no m/g/r Pulm: CTA bilaterally, breaths non-labored Abd: BS+, soft, non-tender  Ext: warm, no peripheral edema Neuro: alert and oriented x 3, no focal deficits    Assessment & Plan:  Please refer to A&P documentation.

## 2015-06-08 NOTE — Patient Instructions (Signed)
-   Take Lantus 16 units in the morning and 4 units in the evening - Start taking Metformin 500 mg daily in the morning - Keep checking your blood sugars 3 times daily - Follow up visit in 2 weeks to check your electrolytes and kidney function  HAPPY THANKSGIVING!  General Instructions:   Please bring your medicines with you each time you come to clinic.  Medicines may include prescription medications, over-the-counter medications, herbal remedies, eye drops, vitamins, or other pills.   Progress Toward Treatment Goals:  Treatment Goal 05/03/2015  Hemoglobin A1C unchanged  Prevent falls -    Self Care Goals & Plans:  Self Care Goal 05/03/2015  Manage my medications take my medicines as prescribed; bring my medications to every visit  Monitor my health keep track of my blood glucose; bring my glucose meter and log to each visit  Eat healthy foods drink diet soda or water instead of juice or soda; eat more vegetables; eat foods that are low in salt; eat baked foods instead of fried foods; eat fruit for snacks and desserts  Be physically active find an activity I enjoy  Prevent falls wear appropriate shoes  Meeting treatment goals maintain the current self-care plan    Home Blood Glucose Monitoring 05/03/2015  Check my blood sugar 3 times a day  When to check my blood sugar before meals     Care Management & Community Referrals:  Referral 06/10/2014  Referrals made for care management support diabetes educator  Referrals made to community resources -

## 2015-06-08 NOTE — Patient Instructions (Signed)
For breakfast Try to eat oatmeal with 1 teaspoon of cinnamon in it or eggs instead of cheerios, with cheese, peaut butter or sometimes spam or bologna half sandwich or Toast.   Your blood sugars are 100 points less since you began  Eating oatmeal and cinnamon instead of cold cereal!!!  Keep eating beans like pintos and try barley instead of rice or spaghetti for lunch and supper   Low carb foods:   peanut butter, light greek yogurt  Eat more fish- whiting, tilapia, salmon, a little bit of tuna salad   [place], cheese, chicken, egg salad, deviled eggs, ham  Use Olive oil and canola oil to season your foods  Vegetables- season with olive oil, okra,

## 2015-06-08 NOTE — Assessment & Plan Note (Addendum)
Lab Results  Component Value Date   HGBA1C 10.7 06/08/2015   HGBA1C 10.3 03/15/2015   HGBA1C 12.4 12/15/2014     Assessment: Diabetes control: poor control (HgbA1C >9%) Progress toward A1C goal:  unchanged Comments: Diabetes control stable. Limited by inability to place patient on short acting meal-time insulin due to history of severe hypoglycemia. Discussed her case with [name] and Dr. Selena BattenKim and will try placing on Metformin 500 mg daily to see if this has any affect on evening blood sugars. Patient agreeable to trying this regimen.   Plan: Medications:  Continue Lantus 16 units in AM and decrease Lantus to 4 units in PM. Start Metformin 500 mg daily in the morning. Patient repeated back dosing to me.  Home glucose monitoring: Frequency: 3 times a day Timing: before meals Instruction/counseling given: reminded to bring blood glucose meter & log to each visit and reminded to bring medications to each visit Self management tools provided: copy of home glucose meter download, instructions for home glucose monitoring Other plans:  - Follow up in 2 weeks for bmet and to reassess blood sugars  - Refilled her glucagon pen and glucose gel

## 2015-06-09 NOTE — Progress Notes (Addendum)
Internal Medicine Clinic Attending  Case discussed with Dr. Beckie Saltsivet soon after the resident saw the patient.  We reviewed the resident's history and exam and pertinent patient test results.  I agree with the assessment, diagnosis, and plan of care documented in the resident's note.  Patient with anemia and CBC not checked in 1 year.  At that time, her Hgb was down to 7.4 from a baseline of around 9-10. Should be rechecked at next visit.

## 2015-06-17 ENCOUNTER — Telehealth: Payer: Self-pay | Admitting: Internal Medicine

## 2015-06-17 ENCOUNTER — Telehealth: Payer: Self-pay | Admitting: Dietician

## 2015-06-17 NOTE — Telephone Encounter (Signed)
Pt requesting the nurse to call back regarding blood pressure and metformin.

## 2015-06-17 NOTE — Telephone Encounter (Signed)
Yes that is fine to stop the Metformin.

## 2015-06-17 NOTE — Telephone Encounter (Signed)
Patient called and notified,. She verbalized understanding.

## 2015-06-17 NOTE — Telephone Encounter (Signed)
Called pt, unaware that she had already spoken with Tobey Brideonna P. CDE.  Pt reporting drowsiness, dizziness, headaches, and diarrhea since she started her metformin.  Lupita LeashDonna has sent message to Dr. Beckie Saltsivet.  I advised pt to do what she had discussed with Lupita LeashDonna (hold metformin until she hears back from PCP).   Will close this encounter since Lupita LeashDonna is also following.

## 2015-06-17 NOTE — Telephone Encounter (Signed)
Patient left message that she Cannot take the 500 mg metformin. It has her head hurting, she has been real dizzy and she is going back and forth to the bathroom. " It is too strong" . Returned her call and told her to hold the metformin until I hear back from her doctor. She says her Last dose was Yesterday after breakfast. She is still running back and forth to the bathroom today. Her blood sugar have been : \  82 this am, 270 yesterday am, 285 at 602 PM, 193 at  949 Pm yesterday May I inform her to stop it completely?

## 2015-06-18 ENCOUNTER — Telehealth: Payer: Self-pay | Admitting: Dietician

## 2015-06-18 NOTE — Telephone Encounter (Signed)
[name] called and left two messages with CDE this am that she still has a very bad headache that started yesterday and generally fells bad. CDE returned call, her blood sugar is 154 this am, she is taking her insulin but no metformin since Tuesday or Wednesday. She says she is going to go to the emergency room to be checked out, CDE agreed and also said she'd confer with the triage nurse when they return from lunch.  Her husband and daughter are with her to taker her there

## 2015-06-18 NOTE — Telephone Encounter (Signed)
Agree with this plan.

## 2015-07-02 ENCOUNTER — Telehealth: Payer: Self-pay | Admitting: Internal Medicine

## 2015-07-02 NOTE — Telephone Encounter (Signed)
LOV FROM Dr. Shea Evansunn on 12/04/2014 being faxed

## 2015-07-06 ENCOUNTER — Ambulatory Visit (INDEPENDENT_AMBULATORY_CARE_PROVIDER_SITE_OTHER): Payer: Medicaid Other | Admitting: Internal Medicine

## 2015-07-06 ENCOUNTER — Encounter: Payer: Self-pay | Admitting: Dietician

## 2015-07-06 ENCOUNTER — Encounter: Payer: Self-pay | Admitting: Internal Medicine

## 2015-07-06 ENCOUNTER — Ambulatory Visit (INDEPENDENT_AMBULATORY_CARE_PROVIDER_SITE_OTHER): Payer: Medicaid Other | Admitting: Dietician

## 2015-07-06 VITALS — BP 129/47 | HR 84 | Temp 98.0°F | Wt 144.3 lb

## 2015-07-06 DIAGNOSIS — Z713 Dietary counseling and surveillance: Secondary | ICD-10-CM

## 2015-07-06 DIAGNOSIS — D509 Iron deficiency anemia, unspecified: Secondary | ICD-10-CM

## 2015-07-06 DIAGNOSIS — IMO0002 Reserved for concepts with insufficient information to code with codable children: Secondary | ICD-10-CM

## 2015-07-06 DIAGNOSIS — R51 Headache: Secondary | ICD-10-CM | POA: Diagnosis not present

## 2015-07-06 DIAGNOSIS — Z794 Long term (current) use of insulin: Secondary | ICD-10-CM

## 2015-07-06 DIAGNOSIS — G44219 Episodic tension-type headache, not intractable: Secondary | ICD-10-CM

## 2015-07-06 DIAGNOSIS — E103399 Type 1 diabetes mellitus with moderate nonproliferative diabetic retinopathy without macular edema, unspecified eye: Secondary | ICD-10-CM

## 2015-07-06 DIAGNOSIS — E1065 Type 1 diabetes mellitus with hyperglycemia: Secondary | ICD-10-CM | POA: Diagnosis present

## 2015-07-06 DIAGNOSIS — R519 Headache, unspecified: Secondary | ICD-10-CM | POA: Insufficient documentation

## 2015-07-06 LAB — GLUCOSE, CAPILLARY: Glucose-Capillary: 258 mg/dL — ABNORMAL HIGH (ref 65–99)

## 2015-07-06 MED ORDER — INSULIN GLARGINE 100 UNIT/ML ~~LOC~~ SOLN: SUBCUTANEOUS | Status: DC

## 2015-07-06 MED ORDER — GLUCAGON (RDNA) 1 MG IJ KIT: 1.0000 mg | PACK | Freq: Once | INTRAMUSCULAR | Status: AC | PRN

## 2015-07-06 NOTE — Assessment & Plan Note (Signed)
Possibly underlying thalassemia given how low the MCV is but iron deficiency could also be playing a role. I do not think getting an anemia panel to work up her anemia further will change management so will order a CBC today to see where her Hgb is at.  - CBC

## 2015-07-06 NOTE — Assessment & Plan Note (Signed)
Her headaches have been occurring daily for the last 3 weeks. They do not seem severe in nature and do not last very long. Most likely tension type headache. Her headache description does not fit the typical picture for a daily persistent headache, but If her headaches do persist for at least 3 months then can consider getting an MRI. Will recommend conservative treatment with Tylenol for now and continue to monitor.

## 2015-07-06 NOTE — Patient Instructions (Signed)
-   Increase morning Lantus to 17 units - Decrease evening Lantus to 3 units to help prevent low blood sugars in morning - Please remember to eat as soon as you take your insulin - Lab work today - Continue doing a great job testing your blood sugar and working with [name] on [employer] Holdingsyour diet!  General Instructions:   Thank you for bringing your medicines today. This helps us keep you safe from mistakes.   Progress Toward Treatment Goals:  Treatment Goal 06/08/2015  Hemoglobin A1C unchanged  Prevent falls -    Self Care Goals & Plans:  Self Care Goal 07/06/2015  Manage my medications take my medicines as prescribed; bring my medications to every visit; refill my medications on time  Monitor my health check my feet daily  Eat healthy foods -  Be physically active find an activity I enjoy  Prevent falls have my vision checked; wear appropriate shoes  Meeting treatment goals -    Home Blood Glucose Monitoring 06/08/2015  Check my blood sugar 3 times a day  When to check my blood sugar before meals     Care Management & Community Referrals:  Referral 06/08/2015  Referrals made for care management support nutritionist; diabetes educator  Referrals made to community resources -

## 2015-07-06 NOTE — Assessment & Plan Note (Signed)
Lab Results  Component Value Date   HGBA1C 10.7 06/08/2015   HGBA1C 10.3 03/15/2015   HGBA1C 12.4 12/15/2014     Assessment: Diabetes control: poor control (HgbA1C >9%) Progress toward A1C goal:  improved Comments: She continues to have elevated blood sugars in the evening due to being unable to use short acting insulin at mealtimes so her postprandial sugars remain high. I will increase her morning Lantus dose but her blood sugars will likely remain elevated as the only solution to her issue is short acting insulin, but this is not a safe option for her. She is having some lows in the morning so will decrease her evening dose to help prevent that. She has made significant improvements in her blood sugars after working with [name] on her diet.   Plan: Medications:  Increase AM Lantus to 17 units and decrease PM Lantus to 3 units  Home glucose monitoring: Frequency: 3 times a day Timing: before meals Instruction/counseling given: reminded to bring blood glucose meter & log to each visit, reminded to bring medications to each visit and discussed diet Self management tools provided: copy of home glucose meter download, instructions for home glucose monitoring Other plans:  - Follow up in 1 month - She will need to continue following with [name] for nutrition education

## 2015-07-06 NOTE — Progress Notes (Signed)
   Subjective:    Patient ID: [name], female    DOB: November 02, 1965, 49 y.o.   MRN: [id number]  HPI [name] is a 49yo woman with PMHx of uncontrolled type 1 DM with severe hypoglycemia, hyperlipidemia, and microcytic anemia who presents today for follow up of her diabetes.   Type 1 DM: She was started on Metformin at her last visit to see if this could help with her evening blood sugars, but she did not tolerate the medication well and this was stopped. She reported associated drowsiness, diarrhea, and headaches. She is taking Lantus 16 units in the AM and 4 units in the PM.  She reports a few low blood sugars in the morning in the 50s but her evening blood sugars are very elevated in the 300s-500s. Her last HbA1c was 10.7 and has improved over the last 6 months from 12.4. She has been meeting with [name] regularly and blood sugars seemed to have improved with diet modifications.   Headaches: She reports daily headaches that started shortly after starting the Metformin. Her first dose of Metformin was on 11/22. She notes the headaches persisted even after stopping the Metformin on 11/30. She describes the headache as located on the top of her head, dull in sensation, lasting 5-10 mins, occurring 1-2 times per day, and improve after she lays down and rests. She denies any associated double vision, blurry vision, nausea, jaw pain, tingling/numbness, or weakness. She has no history of headaches or migraines.   Microcytic Anemia: Last Hgb 7.4 with MCV 66.5. She has a remote hx of thalassemia. Last anemia panel in 2015 with low iron, low saturation, and normal ferritin (199). Patient has been taking supplemental iron. She denies any bleeding.    Review of Systems General: Denies fever, chills, night sweats, changes in weight, changes in appetite HEENT: Denies ear pain, rhinorrhea, sore throat CV: Denies CP, palpitations, SOB, orthopnea Pulm: Denies SOB, cough, wheezing GI: Denies abdominal pain,  diarrhea, constipation, melena, hematochezia GU: Denies dysuria, hematuria, frequency Msk: Denies muscle cramps, joint pains Neuro: See HPI Skin: Denies rashes, bruising Psych: Denies depression, anxiety, hallucinations    Objective:   Physical Exam General: alert, sitting up, pleasant, NAD HEENT: [place]/AT, EOMI, mucus membranes moist CV: RRR, no m/g/r Pulm: CTA bilaterally, breaths non-labored Abd: BS+, soft, non-tender Ext: warm, no peripheral edema Neuro: alert and oriented x 3    Assessment & Plan:  Please refer to A&P documentation.

## 2015-07-06 NOTE — Patient Instructions (Signed)
Use diet cranberry juice instead of lght Juice or drink water or crystal lite  Try to eat more barley, quinoa instead of potato  If you eat potates- eat them cold as in potato salad.  Bread sourdough like english muffins and rye are better on blood sugars.    THROWING AWAY SUPPLIES  Discard used needles in a puncture proof sharps disposal container. Follow disposal regulations for the area where you live.  Vials and empty disposable pens may be thrown away in the regular trash.

## 2015-07-06 NOTE — Progress Notes (Signed)
Diabetes Self-Management Education  Visit Type:   follow up  Appt. Start Time: 240 Appt. End Time: 302  07/06/2015  [name], identified by name and date of birth, is a 49 y.o. female with a diagnosis of Diabetes:Type 1 diabetes   ASSESSMENT  A1C  High at 10.7% goal is < 8.5%, Taking well balanced MVI daily Blood sugars are about the same as last visit: average changed from 257 to 264 this past month BMI is 26  weight - 143 goal weight is ~ 130-135#.         Diabetes Self-Management Education - 07/06/15 1400    Health Coping   How would you rate your overall health? Good   Complications   Last HgB A1C per patient/outside source 10.7 %   How often do you check your blood sugar? 3-4 times/day   Fasting Blood glucose range (mg/dL) <16;10-960;454-098;119-147<70;70-129;130-179;180-200   Postprandial Blood glucose range (mg/dL) >829>200   Dietary Intake   Breakfast oatmeal with cinnamon, light cranberry juice and water   Snack (morning) Yogurt, lunch and dinner 1/2 spam sandwich and juice, water  bedtime snack- 1/2 peanut butter sandwich and skim milk   Patient Education   Previous Diabetes Education Yes- here   Nutrition management  Role of diet in the treatment of diabetes and the relationship between the three main macronutrients and blood glucose level   Individualized Goals (developed by patient)   Nutrition Follow meal plan discussed   Outcomes   Program Status Re-entered   Subsequent Visit   Since your last visit have you continued or begun to take your medications as prescribed? Yes   Since your last visit have you had your blood pressure checked? Yes   Is your most recent blood pressure lower, unchanged, or higher since your last visit? Unchanged   Since your last visit have you experienced any weight changes? Gain   Weight Gain (lbs) <1#   Since your last visit, are you checking your blood glucose at least once a day? Yes      Learning Objective:  Patient will have a greater  understanding of diabetes self-management. Patient education plan is to review nutrition to assist with decreased blood sugars and weight gain.  My plan to support myself in continuing these changes to care for my diabetes is to attend or contact:   Diabetes Support Groups ? Type 1 diabetes support group Other  -doctor's office, CDE, Dietitian, pharmacist, church   Plan:   Patient Instructions  Use diet cranberry juice instead of lght Juice or drink water or crystal lite  Try to eat more barley, quinoa instead of potato  If you eat potates- eat them cold as in potato salad.  Bread sourdough like english muffins and rye are better on blood sugars.    THROWING AWAY SUPPLIES  Discard used needles in a puncture proof sharps disposal container. Follow disposal regulations for the area where you live.  Vials and empty disposable pens may be thrown away in the regular trash.      Expected Outcomes:   expect moderate outcomes as ability to control blood sugars is limited by extreme insulin sensitivity Education material provided: avs, quinoa to try, plastic bottle for sharpes container If problems or questions, patient to contact team via:  Phone Future DSME appointment: -   one month

## 2015-07-07 LAB — CMP14 + ANION GAP
A/G RATIO: 1.3 (ref 1.1–2.5)
ALT: 8 IU/L (ref 0–32)
AST: 14 IU/L (ref 0–40)
Albumin: 4.3 g/dL (ref 3.5–5.5)
Alkaline Phosphatase: 73 IU/L (ref 39–117)
Anion Gap: 18 mmol/L (ref 10.0–18.0)
BILIRUBIN TOTAL: 0.3 mg/dL (ref 0.0–1.2)
BUN/Creatinine Ratio: 17 (ref 9–23)
BUN: 14 mg/dL (ref 6–24)
CHLORIDE: 97 mmol/L (ref 96–106)
CO2: 21 mmol/L (ref 18–29)
Calcium: 9.3 mg/dL (ref 8.7–10.2)
Creatinine, Ser: 0.81 mg/dL (ref 0.57–1.00)
GFR calc non Af Amer: 85 mL/min/{1.73_m2} (ref >59)
GFR, EST AFRICAN AMERICAN: 98 mL/min/{1.73_m2} (ref >59)
GLOBULIN, TOTAL: 3.4 g/dL (ref 1.5–4.5)
GLUCOSE: 249 mg/dL — AB (ref 65–99)
POTASSIUM: 4.5 mmol/L (ref 3.5–5.2)
SODIUM: 136 mmol/L (ref 134–144)
Total Protein: 7.7 g/dL (ref 6.0–8.5)

## 2015-07-07 LAB — CBC
HEMOGLOBIN: 11.5 g/dL (ref 11.1–15.9)
Hematocrit: 38.3 % (ref 34.0–46.6)
MCH: 19.8 pg — AB (ref 26.6–33.0)
MCHC: 30 g/dL — AB (ref 31.5–35.7)
MCV: 66 fL — ABNORMAL LOW (ref 79–97)
PLATELETS: 292 10*3/uL (ref 150–379)
RBC: 5.8 x10E6/uL — AB (ref 3.77–5.28)
RDW: 17.1 % — ABNORMAL HIGH (ref 12.3–15.4)
WBC: 5.1 10*3/uL (ref 3.4–10.8)

## 2015-07-08 NOTE — Progress Notes (Signed)
Internal Medicine Clinic Attending  Case discussed with Dr. Rivet at the time of the visit.  We reviewed the resident's history and exam and pertinent patient test results.  I agree with the assessment, diagnosis, and plan of care documented in the resident's note.  

## 2015-07-08 NOTE — Addendum Note (Signed)
Addended by: Debe CoderMULLEN, Luan Maberry B on: 07/08/2015 05:27 PM   Modules accepted: Level of Service

## 2015-07-27 IMAGING — CT CT HEAD W/O CM
2 of 3 series · 16 of 30 positions shown, 19 images · non-contrast
Comparison: 05/15/2014.

CLINICAL DATA: Altered mental status. Initial encounter.
Unresponsive patient.

EXAM:
CT HEAD WITHOUT CONTRAST
TECHNIQUE: Contiguous axial images were obtained from the base of the skull
through the vertex without intravenous contrast.

[Series 2: head w/o · axial · non-contrast · 0.38mm/px · z∈[-223,-143]mm · 4 of 28 slices shown]
[im 6/28  brain]
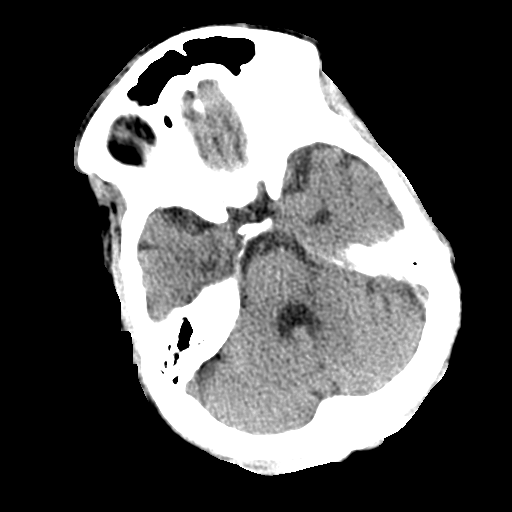
[im 11/28  brain]
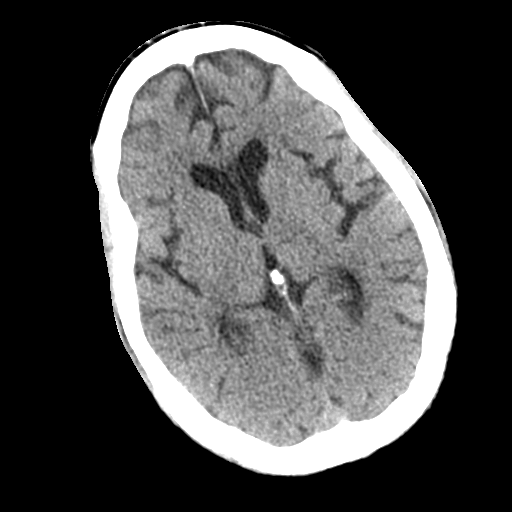
[im 17/28  brain]
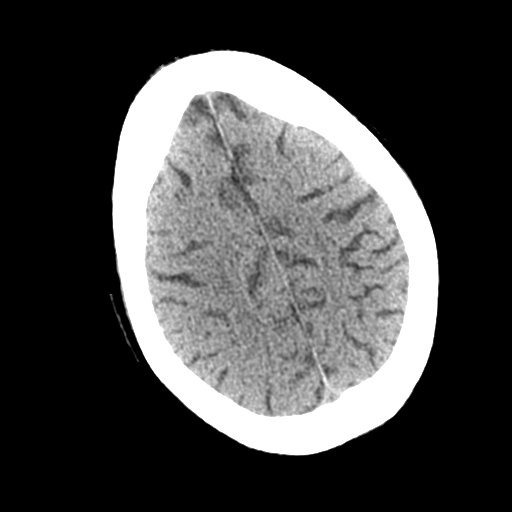
[im 22/28  brain]
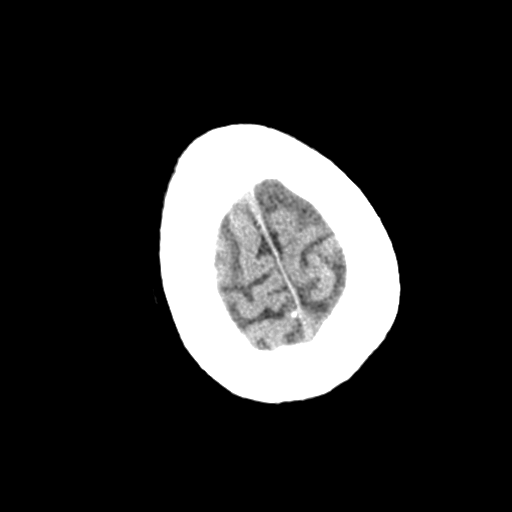

[Series 602: axial · axial · 0.39mm/px · z∈[-225,-119]mm · 12 of 63 slices shown, 15 images]
[im 5/63  brain]
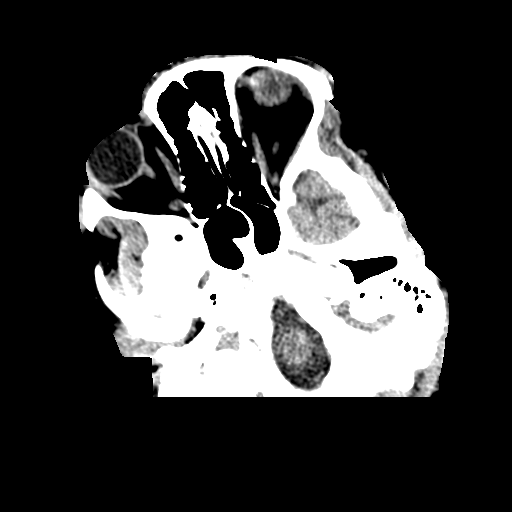
[im 5/63  bone]
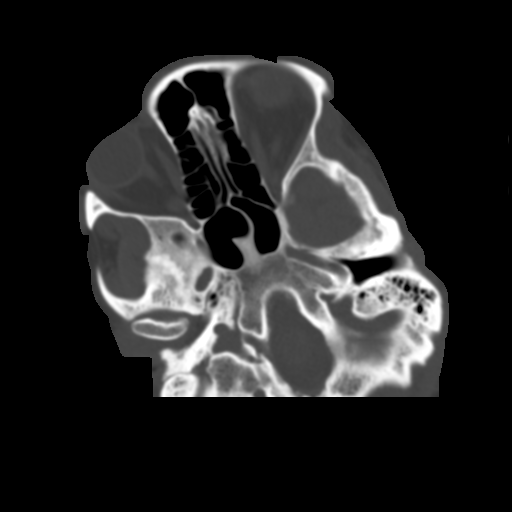
[im 10/63  brain]
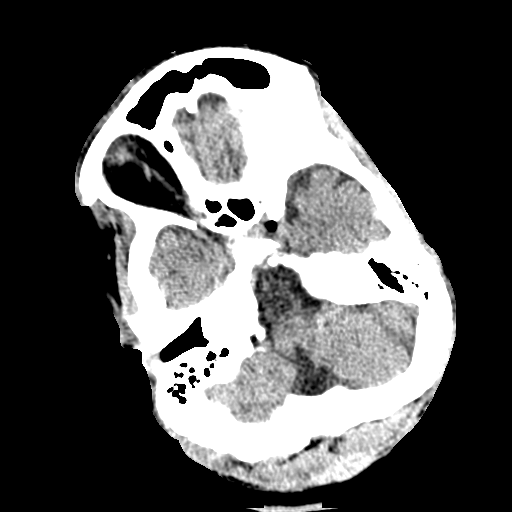
[im 15/63  brain]
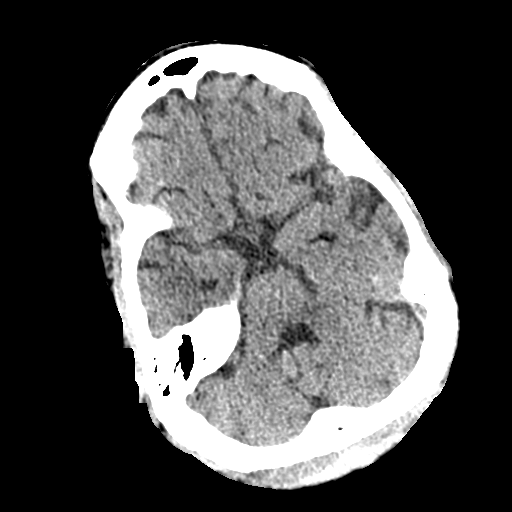
[im 20/63  brain]
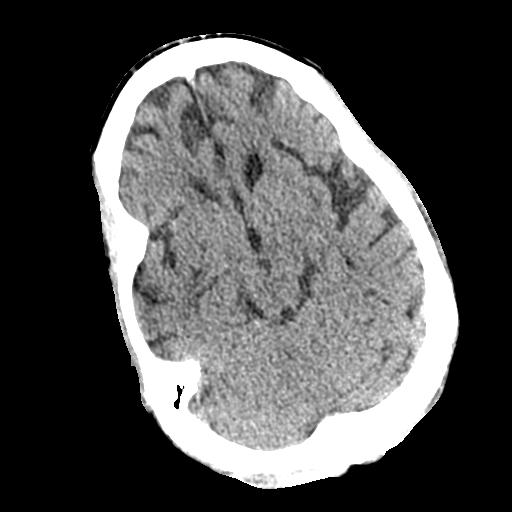
[im 24/63  brain]
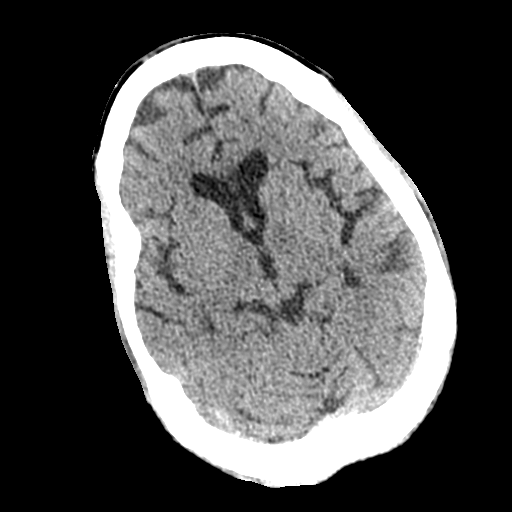
[im 24/63  bone]
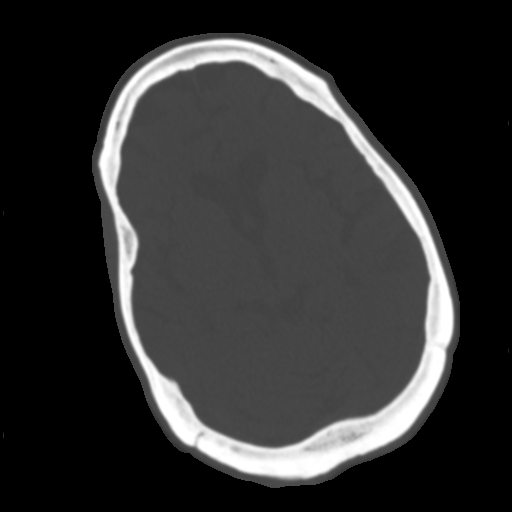
[im 29/63  brain]
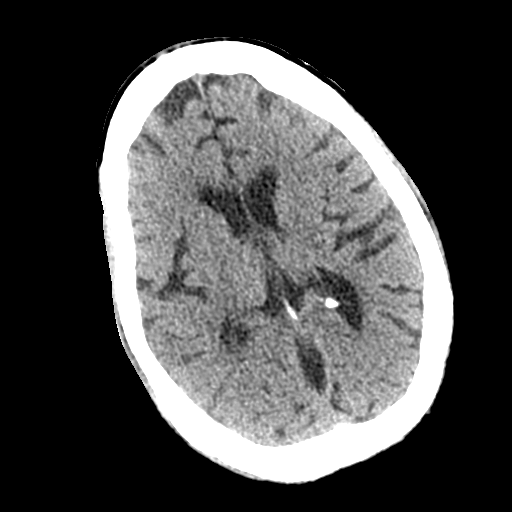
[im 34/63  brain]
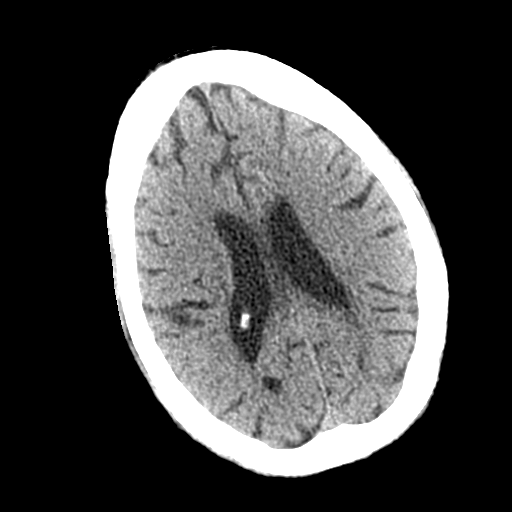
[im 39/63  brain]
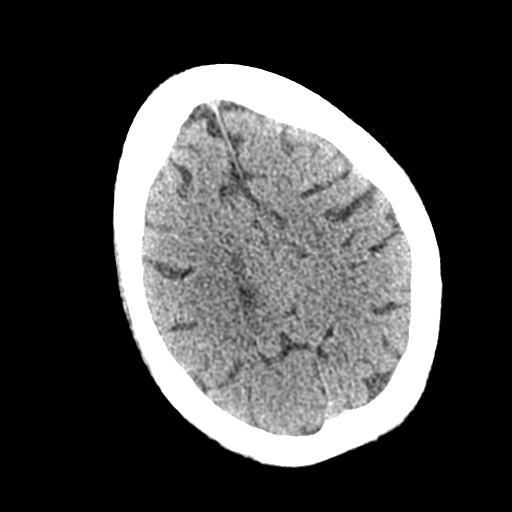
[im 43/63  brain]
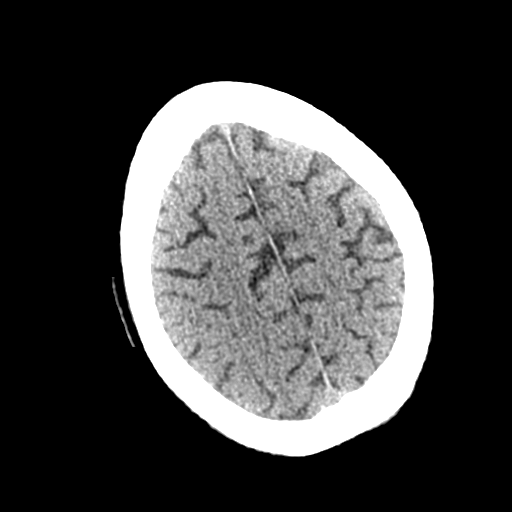
[im 43/63  bone]
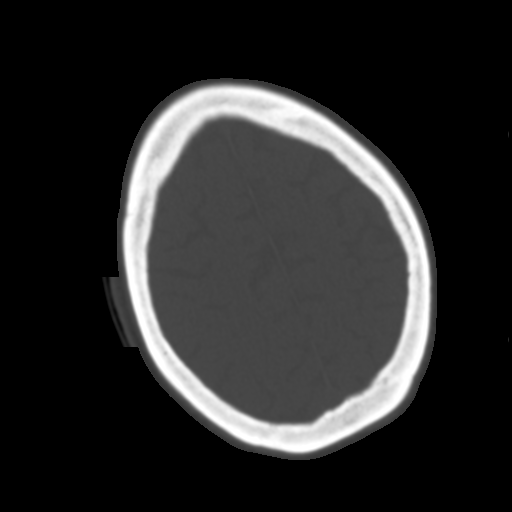
[im 48/63  brain]
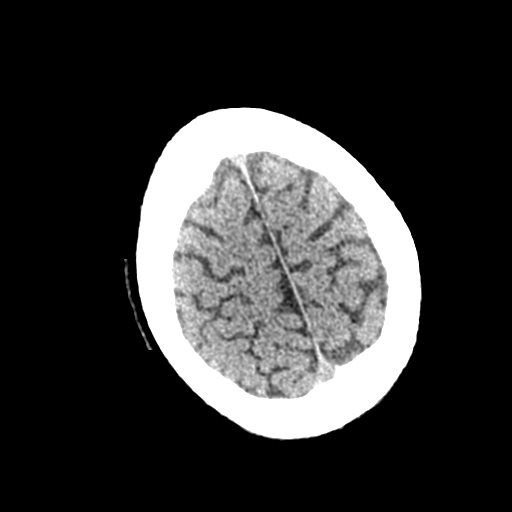
[im 53/63  brain]
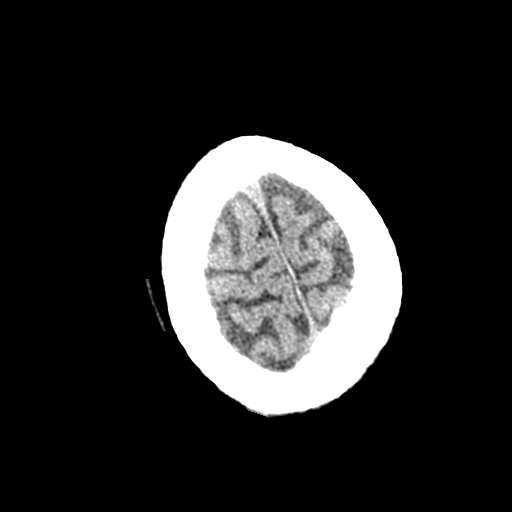
[im 58/63  brain]
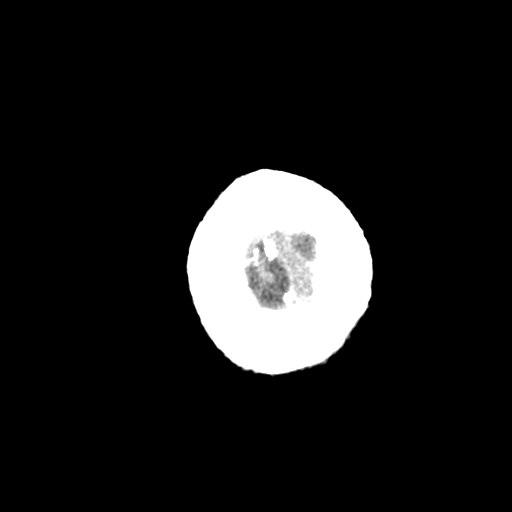

[16 of 30 positions shown; findings below may reference images not displayed]

FINDINGS: No mass lesion, mass effect, midline shift, hydrocephalus,
hemorrhage. No territorial ischemia or acute infarction.

Visible paranasal sinuses are within normal limits.
IMPRESSION: Negative CT head.

## 2015-08-04 ENCOUNTER — Ambulatory Visit (INDEPENDENT_AMBULATORY_CARE_PROVIDER_SITE_OTHER): Payer: Medicaid Other | Admitting: Dietician

## 2015-08-04 VITALS — Wt 140.7 lb

## 2015-08-04 DIAGNOSIS — Z794 Long term (current) use of insulin: Secondary | ICD-10-CM | POA: Diagnosis not present

## 2015-08-04 DIAGNOSIS — IMO0002 Reserved for concepts with insufficient information to code with codable children: Secondary | ICD-10-CM

## 2015-08-04 DIAGNOSIS — E1065 Type 1 diabetes mellitus with hyperglycemia: Secondary | ICD-10-CM

## 2015-08-04 DIAGNOSIS — Z713 Dietary counseling and surveillance: Secondary | ICD-10-CM

## 2015-08-04 DIAGNOSIS — E103399 Type 1 diabetes mellitus with moderate nonproliferative diabetic retinopathy without macular edema, unspecified eye: Secondary | ICD-10-CM

## 2015-08-04 NOTE — Progress Notes (Signed)
Diabetes Self-Management Education  Visit Type:  Follow-up  Appt. Start Time: 130 Appt. End Time: 1402  08/04/2015  [name], identified by name and date of birth, is a 50 y.o. female with a diagnosis of Diabetes:  Marland Kitchen  Type 1 ASSESSMENT A1C  High at 10.7% goal is < 8.5%, Taking well balanced MVI daily and 1 cinnamon tablet Blood sugars are better: average changed to 229 from 264 this past month BMI is ~26  weight - 140.7# ( with shoes and 3 shirts) goal weight is ~ 130-135#.     Weight 140 lb 11.2 oz (63.821 kg). Body mass index is 25.73 kg/(m^2).       Diabetes Self-Management Education - 08/04/15 1400    Health Coping   How would you rate your overall health? Good   Psychosocial Assessment   Patient Belief/Attitude about Diabetes Motivated to manage diabetes   Self-care barriers Low literacy;Debilitated state due to current medical condition;Lack of material resources   Self-management support Doctor's office;Family;CDE visits   Patient Concerns Glycemic Control   Preferred Learning Style Auditory;Visual   Learning Readiness Ready   Complications   How often do you check your blood sugar? 1-2 times/day   Fasting Blood glucose range (mg/dL) 161-096;045-409   Postprandial Blood glucose range (mg/dL) >811   Number of hypoglycemic episodes per month --  1   Have you had a dilated eye exam in the past 12 months? Yes   Are you checking your feet? Yes   Dietary Intake   Breakfast cold cereal, milk and orange juice   Lunch peaches, bacon egg sandwich milk   Dinner lentils   Snack (evening) yogurt   Beverage(s) juice, water. milk   Exercise   Exercise Type Light (walking / raking leaves)   How many days per week to you exercise? 45   How many minutes per day do you exercise? --  7   Patient Education   Previous Diabetes Education Yes (please comment)   Nutrition management  Role of diet in the treatment of diabetes and the relationship between the three main  macronutrients and blood glucose level   Medications Taught/reviewed insulin injection, site rotation, insulin storage and needle disposal.   Individualized Goals (developed by patient)   Nutrition Follow meal plan discussed   Medications Discard insulin after 28 days   Patient Self-Evaluation of Goals - Patient rates self as meeting previously set goals (% of time)   Nutrition 50 - 75 %   Outcomes   Program Status Not Completed   Subsequent Visit   Since your last visit have you continued or begun to take your medications as prescribed? Yes   Since your last visit have you had your blood pressure checked? No   Since your last visit have you experienced any weight changes? No change   Since your last visit, are you checking your blood glucose at least once a day? Yes      Learning Objective:  Patient will have a greater understanding of diabetes self-management. Patient education plan is to attend individual sessions for nutrition reinforcement and medication.   Plan:   Patient Instructions  You will get a new A1C in February. Please make an appointment with Dr. Beckie Salts and me for late next month.   Your blood sugars are doing better! Keep walking every day as you have been!  Please keep track of when you open a bottle of insulin in your book as shown today and throw it  ways after 28 days.   Try to drink mostly water and milk and a little light cranberry juice- please bring in an empty bottle .  Limit bread to 1 slice at a meal.   Use Old fashioned oats as your cereal. Can eat cold or hot.  Potatoes are better for you COLD, not hot.     Expected Outcomes:  Demonstrated interest in learning. Expect positive outcomes Education material provided: AVS If problems or questions, patient to contact team via:  Phone Future DSME appointment: - 4-6 wks   Patient was using an outdated vial of insulin. date she filled it was 11/2014 and very little left in it. She was educated to pick  up new vial, consider insulin pens and watch out/monitor for low blood sugars after opening new vial and to call office if she has any.

## 2015-08-04 NOTE — Patient Instructions (Addendum)
You will get a new A1C in February. Please make an appointment with Dr. Beckie Salts and me for late next month.   Your blood sugars are doing better! Keep walking every day as you have been!  Please keep track of when you open a bottle of insulin in your book as shown today and throw it ways after 28 days.   Try to drink mostly water and milk and a little light cranberry juice- please bring in an empty bottle .  Limit bread to 1 slice at a meal.   Use Old fashioned oats as your cereal. Can eat cold or hot.  Potatoes are better for you COLD, not hot.

## 2015-08-09 ENCOUNTER — Other Ambulatory Visit: Payer: Self-pay | Admitting: Dietician

## 2015-08-09 DIAGNOSIS — E103399 Type 1 diabetes mellitus with moderate nonproliferative diabetic retinopathy without macular edema, unspecified eye: Secondary | ICD-10-CM

## 2015-08-09 DIAGNOSIS — E1065 Type 1 diabetes mellitus with hyperglycemia: Principal | ICD-10-CM

## 2015-08-09 DIAGNOSIS — IMO0002 Reserved for concepts with insufficient information to code with codable children: Secondary | ICD-10-CM

## 2015-08-09 NOTE — Telephone Encounter (Signed)
Patient calls and says she needs prescriptions for test strips and Lantus vials sent to [facility] on [address]. She said she had a few vial at home of Lantus and has been using them. She has not noticed a change in her blood sugar and has not had hypoglycemia since her visit last week.

## 2015-08-10 MED ORDER — GLUCOSE BLOOD VI STRP: ORAL_STRIP | Status: DC

## 2015-08-10 MED ORDER — INSULIN GLARGINE 100 UNIT/ML ~~LOC~~ SOLN: SUBCUTANEOUS | Status: DC

## 2015-09-06 ENCOUNTER — Telehealth: Payer: Self-pay | Admitting: Dietician

## 2015-09-06 NOTE — Progress Notes (Deleted)
Patient individualized diabetes plan discussed today with patient and includes:  what is Diabetes,  Nutrition,  medications,  monitoring,  physical activity,  how to handle highs and lows,  Special care for my body when I have diabetes,  Dealing daily with diabetes

## 2015-09-06 NOTE — Telephone Encounter (Signed)
Left message for patient to call back if she thought she needed help or to be seen sooner than her appointment with dr. Beckie Salts tomorrow at 1:45 PM.

## 2015-09-06 NOTE — Telephone Encounter (Signed)
Having problems with high blood sugar and also feels low at times. she usually walks but didn't yesterday because she felt like her sugar was low.  Yesterday morning she ate- whole grain mini pancakes with 6 in a pack, applesauce, ketchup on the pancakes. Her sugar at 816- was 119  ate dinner before 6 - mashed potatoes, roast garlic in olive oil, boiled chicken, lite cranberry juice.  627 pm- her blood sugar was 274 9 pm -294  845-900-ate 3 small oatmeal cookies thought sugar was low 7am today - oatmeal- salt and water, butter, sweet'n low, milk,  cheese toast , lite cranberry juice-not a full cup 1012 am  Her blood sugar is 439 08/04/15= opened a new bottle of lantus Taking lantus 17 units in the am, 3 at 6 pm.   CDE again counseled her to drink only water, diet clear sodas and unsweetened tea. ( No juice) to try to cut back on starchy foods and fruits. She says she is eating more avocados, peanut butter, spam. The call was cut off. Note she has an appointment tomorrow.

## 2015-09-07 ENCOUNTER — Ambulatory Visit (INDEPENDENT_AMBULATORY_CARE_PROVIDER_SITE_OTHER): Payer: Medicaid Other | Admitting: Dietician

## 2015-09-07 ENCOUNTER — Other Ambulatory Visit: Payer: Self-pay | Admitting: Internal Medicine

## 2015-09-07 ENCOUNTER — Ambulatory Visit (INDEPENDENT_AMBULATORY_CARE_PROVIDER_SITE_OTHER): Payer: Medicaid Other | Admitting: Internal Medicine

## 2015-09-07 ENCOUNTER — Encounter: Payer: Self-pay | Admitting: Internal Medicine

## 2015-09-07 VITALS — BP 127/65 | HR 78 | Temp 97.6°F | Ht 65.0 in | Wt 144.8 lb

## 2015-09-07 DIAGNOSIS — Z8669 Personal history of other diseases of the nervous system and sense organs: Secondary | ICD-10-CM | POA: Diagnosis not present

## 2015-09-07 DIAGNOSIS — Z713 Dietary counseling and surveillance: Secondary | ICD-10-CM | POA: Diagnosis not present

## 2015-09-07 DIAGNOSIS — Z794 Long term (current) use of insulin: Secondary | ICD-10-CM

## 2015-09-07 DIAGNOSIS — E103399 Type 1 diabetes mellitus with moderate nonproliferative diabetic retinopathy without macular edema, unspecified eye: Secondary | ICD-10-CM

## 2015-09-07 DIAGNOSIS — IMO0002 Reserved for concepts with insufficient information to code with codable children: Secondary | ICD-10-CM

## 2015-09-07 DIAGNOSIS — E1065 Type 1 diabetes mellitus with hyperglycemia: Secondary | ICD-10-CM

## 2015-09-07 DIAGNOSIS — G4452 New daily persistent headache (NDPH): Secondary | ICD-10-CM

## 2015-09-07 LAB — POCT GLYCOSYLATED HEMOGLOBIN (HGB A1C): HEMOGLOBIN A1C: 9.9

## 2015-09-07 LAB — GLUCOSE, CAPILLARY: GLUCOSE-CAPILLARY: 264 mg/dL — AB (ref 65–99)

## 2015-09-07 MED ORDER — INSULIN GLARGINE 100 UNIT/ML ~~LOC~~ SOLN: SUBCUTANEOUS | Status: DC

## 2015-09-07 NOTE — Assessment & Plan Note (Signed)
Her headaches have completely resolved and no other neurological symptoms. Will continue to monitor.

## 2015-09-07 NOTE — Patient Instructions (Signed)
-   Increase morning Lantus to 18 units - Decrease evening Lantus to 2 units - Continue checking blood sugars twice daily - You are doing a great job with your diet and checking your blood sugars! Keep up the good work!  General Instructions:   Please bring your medicines with you each time you come to clinic.  Medicines may include prescription medications, over-the-counter medications, herbal remedies, eye drops, vitamins, or other pills.   Progress Toward Treatment Goals:  Treatment Goal 07/06/2015  Hemoglobin A1C improved    Self Care Goals & Plans:  Self Care Goal 07/06/2015  Manage my medications take my medicines as prescribed; bring my medications to every visit; refill my medications on time  Monitor my health check my feet daily; keep track of my blood glucose; bring my glucose meter and log to each visit  Eat healthy foods eat more vegetables; eat foods that are low in salt; eat baked foods instead of fried foods  Be physically active find an activity I enjoy  Prevent falls have my vision checked; wear appropriate shoes    Home Blood Glucose Monitoring 07/06/2015  Check my blood sugar 3 times a day  When to check my blood sugar before meals     Care Management & Community Referrals:  Referral 07/06/2015  Referrals made for care management support nutritionist; diabetes educator

## 2015-09-07 NOTE — Progress Notes (Signed)
Diabetes Self-Management Education  Visit Type:  Follow-up  Appt. Start Time: 1435 Appt. End Time: 1505  09/07/2015  [name], identified by name and date of birth, is a 50 y.o. female with a diagnosis of Diabetes:  Marland Kitchen  Type 1 diabetes  ASSESSMENT  a1C decreased to 9.9- almost 1%  BMI.24- with in healthy paramenters       Diabetes Self-Management Education - 09/07/15 1400    Health Coping   How would you rate your overall health? Good   Psychosocial Assessment   Patient Belief/Attitude about Diabetes Motivated to manage diabetes   Self-management support Doctor's office;Friends;Family;CDE visits   Patient Concerns Glycemic Control   Preferred Learning Style Auditory   Learning Readiness Ready   Complications   How often do you check your blood sugar? 1-2 times/day   Postprandial Blood glucose range (mg/dL) >161   Have you had a dilated eye exam in the past 12 months? Yes   Have you had a dental exam in the past 12 months? No  she requests a [place] one [place]   Are you checking your feet? Yes   How many days per week are you checking your feet? 7   Dietary Intake   Breakfast whokle lgrain pancakes, applesauce   Snack (morning) avacados   Lunch mashed potatoes   Dinner stew beef, squash   Snack (evening) half peanut butter sandwich   Beverage(s) lite cranberry juice,w ater coffee   Exercise   Exercise Type ADL's;Light (walking / raking leaves)   How many days per week to you exercise? 7   How many minutes per day do you exercise? 45   Total minutes per week of exercise 315   Patient Education   Previous Diabetes Education Yes (please comment)   Nutrition management  Meal timing in regards to the patients' current diabetes medication.   Chronic complications Dental care   Patient Self-Evaluation of Goals - Patient rates self as meeting previously set goals (% of time)   Nutrition >75%   Outcomes   Program Status Not Completed   Subsequent Visit    Since your last visit have you continued or begun to take your medications as prescribed? Yes   Since your last visit have you had your blood pressure checked? No   Since your last visit have you experienced any weight changes? Gain   Weight Gain (lbs) 4   Since your last visit, are you checking your blood glucose at least once a day? Yes      Learning Objective:  Patient will have a greater understanding of diabetes self-management. Patient education plan is to attend individual and/or group sessions< nutrition and medication  My plan to support myself in continuing these changes to care for my diabetes is to attend or contact:   Diabetes Support Groups ?Try the Type 1 diabetes support group Other ?-doctor's office, CDE, Dietitian, pharmacist,  Plan:   Patient Instructions  Follow up with Dr. Beckie Salts in 3 months- May 2017.  Follow up with Tobey Bride on the same day  Homework- please eat in between meals- balances snacks with some type of protein or fat                     Bring empty container of lite cranberry juice                     If you want to write down every you eat  and drink tomorrow on                     sheet provided, put it in your book to bring back with yo in May.   KEEP WALKING and eating healthy it is making a difference!    Expected Outcomes:  Demonstrated interest in learning. Expect positive outcomes  Education material provided: A1C conversion sheet  If problems or questions, patient to contact team via:  Phone  Future DSME appointment: - 3-4 months

## 2015-09-07 NOTE — Progress Notes (Signed)
   Subjective:    Patient ID: [name], female    DOB: [birth year], 50 y.o.   MRN: [id number]  HPI [name] is a 50yo woman with PMHx of uncontrolled type 1 DM, microcytic anemia, and hyperlipidemia who presents today for follow up of her diabetes.  Type 1 DM: Last A1c 10.7 in Nov 2016. Today her A1c is 9.9. She reports taking Lantus 17 units in the morning and 3 units in the evening. She is unable to take short acting insulin due to her history of severe hypoglycemia. She reports elevated blood sugars in the evening and sometimes in the morning. She reports less episodes of hypoglycemia with her lowest blood sugar being 81. However, upon review of her glucometer she had 4 lows within 1 week with the lowest at 44. After showing this to her, she does recall these lows and states she knows when she is hypoglycemic. She thinks she eats less the night before on days when she is low in the morning. She reports working hard on her diet, avoiding starchy foods and eating more protein. She has also cut out juices.   Headaches: She reports her headaches resolved on their own shortly after her last appointment with me. She reports Tylenol relieved them. She denies any changes in vision, weakness, numbness/tingling. She reports her last headache was "a long time ago."   Review of Systems General: Denies fever, chills, night sweats, changes in weight, changes in appetite HEENT: Denies headaches, ear pain, changes in vision, rhinorrhea, sore throat CV: Denies CP, palpitations, SOB, orthopnea Pulm: Denies SOB, cough, wheezing GI: Denies abdominal pain, nausea, vomiting, diarrhea, constipation, melena, hematochezia GU: Denies dysuria, hematuria, frequency Msk: Denies muscle cramps, joint pains Neuro: Denies weakness, numbness, tingling Skin: Denies rashes, bruising Psych: Denies depression, anxiety, hallucinations    Objective:   Physical Exam General: alert, sitting up, NAD HEENT: [place]/AT, EOMI,  sclera anicteric, mucus membranes moist CV: RRR, no m/g/r Pulm: CTA bilaterally, breaths non-labored Abd: BS+, soft, non-tender Ext: warm, no peripheral edema Neuro: alert and oriented x 3     Assessment & Plan:  Please refer to A&P documentation.

## 2015-09-07 NOTE — Patient Instructions (Signed)
Follow up with Dr. Beckie Salts in 3 months- May 2017.  Follow up with Tobey Bride on the same day  Homework- please eat in between meals- balances snacks with some type of protein or fat                     Bring empty container of lite cranberry juice                     If you want to write down every you eat and drink tomorrow on                     sheet provided, put it in your book to bring back with yo in May.   KEEP WALKING and eating healthy it is making a difference!

## 2015-09-07 NOTE — Assessment & Plan Note (Signed)
Lab Results  Component Value Date   HGBA1C 9.9 09/07/2015   HGBA1C 10.7 06/08/2015   HGBA1C 10.3 03/15/2015     Assessment: Diabetes control: poor control (HgbA1C >9%) Progress toward A1C goal:  improved Comments: Her A1c has improved by almost 1 percentage point since her last visit. Her dietary changes have made a significant difference and this is important as we are limited by not being able to use short acting insulin to control her blood sugars. She is still getting significant lows in the morning (40s-60s). Will adjust her insulin accordingly.   Plan: Medications:  Decrease PM Lantus to 2 units. Increase AM Lantus to 18 units.  Home glucose monitoring: Frequency: 3 times a day Timing: before meals Instruction/counseling given: reminded to bring blood glucose meter & log to each visit, reminded to bring medications to each visit, discussed foot care and discussed diet Educational resources provided: handout Self management tools provided: copy of home glucose meter download, instructions for home glucose monitoring, home glucose testing supplies Other plans:  - She is doing excellent for being limited in her medical therapies for her type 1 DM. I commended her for doing a great job with making adjustments to her diet and continuing to check her blood sugars - Meeting with [name] today for nutrition and diabetes education - f/u in 3 months - A1c in 3 months

## 2015-09-09 NOTE — Progress Notes (Signed)
Internal Medicine Clinic Attending  Case discussed with Dr. Rivet at the time of the visit.  We reviewed the resident's history and exam and pertinent patient test results.  I agree with the assessment, diagnosis, and plan of care documented in the resident's note.  

## 2015-09-13 ENCOUNTER — Telehealth: Payer: Self-pay | Admitting: Dietician

## 2015-09-13 DIAGNOSIS — E1065 Type 1 diabetes mellitus with hyperglycemia: Principal | ICD-10-CM

## 2015-09-13 DIAGNOSIS — IMO0002 Reserved for concepts with insufficient information to code with codable children: Secondary | ICD-10-CM

## 2015-09-13 DIAGNOSIS — E103399 Type 1 diabetes mellitus with moderate nonproliferative diabetic retinopathy without macular edema, unspecified eye: Secondary | ICD-10-CM

## 2015-09-13 NOTE — Telephone Encounter (Signed)
Patient leaves a message saying she needs Lantus insulin and chemstrips. Called [facility] on [address] to be sure there are not any problems with her refills: she has refills on both.  Called patient and asked her to call her pharmacy for refills.

## 2015-09-27 ENCOUNTER — Telehealth: Payer: Self-pay | Admitting: Dietician

## 2015-09-27 NOTE — Telephone Encounter (Signed)
patient calls asking for us to write up papers  that the following person is her caregiver: gives her assistance with her health, taking her insulin, writing her blood sugars down, helping her around the house, bathing, getting dressed. exercising and  transportation.  [name] birthday is 11-22-1984  Diabetes educator told patient that I would route message to social worker to see if she could help her.

## 2015-09-28 NOTE — Telephone Encounter (Signed)
CSW placed called to pt.  CSW left message requesting return call. CSW provided contact hours and phone number.  Once contact is made with [name], CSW will obtain additional information regarding pt's assistance.  [name] was linked with [facility] previously.  It is unknown if pt is still active with an agency.

## 2015-10-01 ENCOUNTER — Telehealth: Payer: Self-pay | Admitting: Licensed Clinical Social Worker

## 2015-10-01 NOTE — Telephone Encounter (Signed)
[name].  CSW returned call to pt to confirm pt's needs.  [name] states she is receiving hands on care from Ms. Maysha Bynum, DOB: [birth year].  Ms. Solon AugustaBynum is pt's daughter and assists Ms. Vannostrand with ADL's,  Medication management, checking blood sugars, and transportation.  [name] no longer receives personal care services, thus is helped by daughter.  [name] is requesting letter to be provided to allow daughter to say with her overnight in her housing unit.  Pt requesting letter to be mailed to: [address], [place] [id number].  Pt aware this request will be forwarded to PCP.

## 2015-10-01 NOTE — Telephone Encounter (Signed)
See CSW telephone note 09/28/15

## 2015-10-05 ENCOUNTER — Encounter: Payer: Self-pay | Admitting: Internal Medicine

## 2015-10-05 ENCOUNTER — Other Ambulatory Visit: Payer: Self-pay | Admitting: Internal Medicine

## 2015-10-06 NOTE — Telephone Encounter (Signed)
CSW placed call to Ms. Courts.  Pt notified letter is completed and per request will be placed in the mail to address specified by patient.

## 2015-10-13 ENCOUNTER — Telehealth: Payer: Self-pay | Admitting: Dietician

## 2015-10-13 NOTE — Telephone Encounter (Signed)
[name] left a message on voicemail that she has had a cold and her blood sugar was 462 on march 28th at 614, and at 949 it went up to 528, has been taking cough syrup and cough medicine because she has had a cold for 3 days.  Returned call to patient: she says today her blood sugars are Running a little low because no appetite 98 this morning.  educated her that both the light cranberry juice that she is drinking and the cough syrup and medicine may increase her blood sugars. Educated her that she should only be taking diabetic Tussin, and sugar free liquids when her blood sugars are that high.  Offered to transfer her call to our triage nurse for an appointment, but she declined saying she was going to see if she would feel better in a day or two. She agreed to call the office if her blood sugars stay high or  She is not improving.

## 2015-10-26 NOTE — Telephone Encounter (Signed)
Called to follow up with patient. Unable to leave a message.

## 2015-11-01 ENCOUNTER — Telehealth: Payer: Self-pay | Admitting: Dietician

## 2015-11-01 NOTE — Telephone Encounter (Signed)
[name] called twice over the Easter holiday when the office was closed. she left messages that said she was "Calling concerning her blood sugar on April 13th at lunch it was 492, she forgot to take her 18 units. She took it later. "  Today when asked about her blood sugar she says " She thought her sugar dropped, but she got up and ate before checking, has not checked it yet and feel fine. " her call was transferred to the front office to schedule appointments

## 2015-11-09 ENCOUNTER — Encounter: Payer: Self-pay | Admitting: Dietician

## 2015-11-09 ENCOUNTER — Encounter: Payer: Self-pay | Admitting: Internal Medicine

## 2015-11-09 ENCOUNTER — Ambulatory Visit (INDEPENDENT_AMBULATORY_CARE_PROVIDER_SITE_OTHER): Payer: Medicaid Other | Admitting: Dietician

## 2015-11-09 ENCOUNTER — Ambulatory Visit (INDEPENDENT_AMBULATORY_CARE_PROVIDER_SITE_OTHER): Payer: Medicaid Other | Admitting: Internal Medicine

## 2015-11-09 VITALS — Wt 142.1 lb

## 2015-11-09 VITALS — BP 134/88 | HR 104 | Temp 98.7°F | Ht 65.0 in | Wt 142.1 lb

## 2015-11-09 DIAGNOSIS — Z794 Long term (current) use of insulin: Secondary | ICD-10-CM | POA: Diagnosis not present

## 2015-11-09 DIAGNOSIS — IMO0002 Reserved for concepts with insufficient information to code with codable children: Secondary | ICD-10-CM

## 2015-11-09 DIAGNOSIS — E103399 Type 1 diabetes mellitus with moderate nonproliferative diabetic retinopathy without macular edema, unspecified eye: Secondary | ICD-10-CM

## 2015-11-09 DIAGNOSIS — E1065 Type 1 diabetes mellitus with hyperglycemia: Secondary | ICD-10-CM

## 2015-11-09 DIAGNOSIS — Z713 Dietary counseling and surveillance: Secondary | ICD-10-CM

## 2015-11-09 MED ORDER — INSULIN GLARGINE 100 UNIT/ML ~~LOC~~ SOLN: SUBCUTANEOUS | Status: DC

## 2015-11-09 MED ORDER — "INSULIN SYRINGE-NEEDLE U-100 31G X 15/64"" 0.3 ML MISC": Status: DC

## 2015-11-09 MED ORDER — GLUCOSE BLOOD VI STRP: ORAL_STRIP | Status: DC

## 2015-11-09 NOTE — Assessment & Plan Note (Addendum)
Her diabetes is difficult to control as she has not been able to be treated with short acting insulin with her history of severe hypoglycemia. Will continue her current dosing as her lows were during her acute illness that is now resolving and she is now eating more consistently. I advised her to follow up in 1 month and we will reassess her blood sugars.  - Continue Lantus 18 in AM and 2 in PM - Check urine microalbumin today. Likely would benefit from ACE but do not want to add on many medications to interfere with her insulin compliance - Check A1c next visit - Follow up in 1 month  - Patient reminded to call Dr. Shea Evansunn for eye appointment

## 2015-11-09 NOTE — Patient Instructions (Signed)
Please make a follow up appointment  With Lupita LeashDonna in July  2017.   Lupita LeashDonna [phone]

## 2015-11-09 NOTE — Patient Instructions (Signed)
-   Keep your insulin the same. 18 units in the morning and 2 units in the evening. - If you get sick again, please call the clinic so we can help you with your insulin - Please call Dr. Shea Evansunn to get an eye appointment   Please bring your medicines with you each time you come to clinic.  Medicines may include prescription medications, over-the-counter medications, herbal remedies, eye drops, vitamins, or other pills.   Progress Toward Treatment Goals:  Treatment Goal 09/07/2015  Hemoglobin A1C improved    Self Care Goals & Plans:  Self Care Goal 11/09/2015  Manage my medications take my medicines as prescribed; bring my medications to every visit; refill my medications on time  Monitor my health keep track of my blood glucose; check my feet daily  Eat healthy foods eat more vegetables; eat foods that are low in salt; eat baked foods instead of fried foods  Be physically active find an activity I enjoy; take a walk every day  Prevent falls have my vision checked    Home Blood Glucose Monitoring 09/07/2015  Check my blood sugar 3 times a day  When to check my blood sugar before meals     Care Management & Community Referrals:  Referral 09/07/2015  Referrals made for care management support nutritionist; diabetes educator

## 2015-11-09 NOTE — Progress Notes (Signed)
   Subjective:    Patient ID: [name], female    DOB: [birth year], 50 y.o.   MRN: [id number]  HPI Ms. Gasser is a 50yo woman with PMHx of uncontrolled type 1 diabetes, hyperlipidemia, and microcytic anemia who presents today for follow up of her diabetes.  Type 1 DM: Her last A1c was 9.9, down from 10.7 previously. She has made significant improvement in her blood sugar control through dietary changes and working with our clinic nutritionist. She reports taking Lantus 18 units in the AM and 2 units in the PM. She states she had cold symptoms for the past few weeks and had several low blood sugars due to skipping meals as she did not feel well. She notes most lows were around 3-4AM and she did not test her blood sugar at this time. She notes 1 severe low episode where her blood sugar was 44 and her boyfriend and daughter had to assist her with getting food/drink to get her blood sugar up. She states her family told her she was "foaming at the mouth and sweating." She reports she is feeling much better now and eating more consistently. Per review of her glucometer, she is having about 1 low per week with the lowest reading at 49. She states she was still feeling bad at this time.    Review of Systems General: Denies fever, chills, night sweats, changes in weight HEENT: Denies headaches, ear pain, changes in vision, rhinorrhea, sore throat CV: Denies CP, palpitations, SOB, orthopnea Pulm: Denies SOB, cough, wheezing GI: Denies abdominal pain, nausea, vomiting, diarrhea, constipation, melena, hematochezia GU: Denies dysuria, hematuria, frequency Msk: Denies muscle cramps, joint pains Neuro: Denies weakness, numbness, tingling Skin: Denies rashes, bruising Psych: Denies depression, anxiety, hallucinations    Objective:   Physical Exam General: alert, sitting up, NAD HEENT: [place]/AT, EOMI, sclera anicteric, mucus membranes moist  CV: RRR, no m/g/r Pulm: CTA bilaterally, breaths  non-labored Abd: BS+, soft, non-tender Ext: warm, no peripheral edema, distal pulses 2+, no sores noted on feet Neuro: alert and oriented x 3, no focal deficits     Assessment & Plan:  Please refer to A&P documentation.

## 2015-11-09 NOTE — Progress Notes (Signed)
Diabetes Self-Management Education  Visit Type:  Follow-up  Appt. Start Time: 1340 Appt. End Time: 1415  11/09/2015  [name], identified by name and date of birth, is a 50 y.o. female with a diagnosis of Diabetes:  Marland Kitchen. Type 1 diabetes x48 years  ASSESSMENT  Weight 142 lb 1.6 oz (64.456 kg). Body mass index is 23.65 kg/(m^2).   Blood sugars; meter download shows average of 250 (~ A1C of 10.2) range of 49 to 528      Diabetes Self-Management Education - 11/09/15 1500    Health Coping   How would you rate your overall health? Good   Psychosocial Assessment   Patient Belief/Attitude about Diabetes Motivated to manage diabetes   Self-care barriers None   Self-management support Doctor's office;Family;CDE visits   Patient Concerns Nutrition/Meal planning   Special Needs None;Simplified materials   Preferred Learning Style Visual;Hands on;No preference indicated   Learning Readiness Ready   Complications   Number of hypoglycemic episodes per month 4   Can you tell when your blood sugar is low? Yes   What do you do if your blood sugar is low? --  get juice if she can, had one severe low this month   Number of hyperglycemic episodes per week --  >50%   Have you had a dilated eye exam in the past 12 months? Yes   Have you had a dental exam in the past 12 months? No   Are you checking your feet? Yes   How many days per week are you checking your feet? 7   Dietary Intake   Breakfast boost or pacncakes   Lunch sanwich   Dinner meat and vegetables   Snack (evening) fruit, fiber one bars   Beverage(s) stopped drinking rcranberry juice   Exercise   Exercise Type ADL's;Light (walking / raking leaves)   How many days per week to you exercise? 7   How many minutes per day do you exercise? 60   Total minutes per week of exercise 420   Patient Education   Previous Diabetes Education Yes ([facility] and [facility])    Nutrition management  Role of diet in the treatment of diabetes  and the relationship between the three main macronutrients and blood glucose level;Food label reading, portion sizes and measuring food.   Individualized Goals (developed by patient)   Nutrition General guidelines for healthy choices and portions discussed   Patient Self-Evaluation of Goals - Patient rates self as meeting previously set goals (% of time)   Nutrition >75%   Outcomes   Program Status Completed   Subsequent Visit   Since your last visit have you continued or begun to take your medications as prescribed? Yes   Since your last visit have you had your blood pressure checked? No   Since your last visit have you experienced any weight changes? No change   Since your last visit, are you checking your blood glucose at least once a day? Yes      Learning Objective:  Patient will have a greater understanding of diabetes self-management. Patient education plan is to attend individual sessions for nutrition education and support.  My plan to support myself in continuing these changes to care for my diabetes is to attend or contact:  Diabetes Support Groups ?Type 1 diabetes support group Other ? Add other local support resources -doctor's office, CDE, Dietitian, pharmacist, church  Plan:   Patient Instructions  Please make a follow up appointment  With [name]  in July  2017.   Nicole Leash [phone]     Expected Outcomes:  Demonstrated interest in learning. Expect positive outcomes Education material provided: My Plate If problems or questions, patient to contact team via:  Phone Future DSME appointment: - 3-4 months

## 2015-11-10 LAB — MICROALBUMIN / CREATININE URINE RATIO
CREATININE, UR: 127.6 mg/dL
MICROALB/CREAT RATIO: 29.9 mg/g{creat} (ref 0.0–30.0)
MICROALBUM., U, RANDOM: 38.1 ug/mL

## 2015-11-11 NOTE — Progress Notes (Signed)
Internal Medicine Clinic Attending  Case discussed with Dr. Rivet soon after the resident saw the patient.  We reviewed the resident's history and exam and pertinent patient test results.  I agree with the assessment, diagnosis, and plan of care documented in the resident's note.  

## 2015-12-17 ENCOUNTER — Telehealth: Payer: Self-pay | Admitting: Dietician

## 2015-12-17 ENCOUNTER — Telehealth: Payer: Self-pay | Admitting: Licensed Clinical Social Worker

## 2015-12-17 NOTE — Telephone Encounter (Signed)
Patient left 3 messages on voice mail last of which says she has no electricity for the last week, another [facility] Today she says that she is trying to find a place to stay, they arrested her because of an old charge from 2013. She has medicine and insulin and testing supplies but is concerned about keeping it cold, needs food because it all went bad. She is staying in the house but doesn;t want to and she has no money to go anywhere. Explained that her insulin should be okay as long it stays below 85 degrees farenheit, she knows about food pantries to replace some fo the food. Told her I would ask our social worker to call her.

## 2015-12-17 NOTE — Telephone Encounter (Signed)
CSW placed call to Ms. Dangerfield this morning.  From what this worker can gather, pt's landlord has shut off utilities in the boarding home and provided Ms. Delellis with notice to leave.  [name] states she has been without electricity and water for several weeks and is in need of emergency housing.  Pt has been to the known community resources: [facility], [facility], [facility], [facility] and [facility].  Pt states all have waiting list and [facility] has report of landlord on file.  [name] unable to stay with daughter and mother, CSW encouraged pt to potential keep valuable belongings with family.  [name] is requesting a letter to be mailed to [facility] on her behalf stating she would benefit from housing due to her chronic medical issues, needs to remain at shelter during daytime hours and benefits from 24hr caregiver.  Pt requesting the letter to be mailed to [facility] and copy to her home address.  CSW will send request to pt's PCP.

## 2015-12-17 NOTE — Telephone Encounter (Signed)
CSW will make referral to [facility], however, unsure if pt is [place] to qualify.

## 2015-12-21 ENCOUNTER — Other Ambulatory Visit: Payer: Self-pay | Admitting: Internal Medicine

## 2015-12-22 NOTE — Telephone Encounter (Signed)
Pt has been assessed and assigned to [facility], D. Timmons.

## 2015-12-22 NOTE — Telephone Encounter (Signed)
PCP provided letter.  CSW place one in mail to pt's chart address and [facility], per pt request.

## 2015-12-24 ENCOUNTER — Encounter: Payer: Self-pay | Admitting: Licensed Clinical Social Worker

## 2015-12-24 NOTE — Progress Notes (Signed)
Patient ID: [name], female   DOB: [birth year], 50 y.o.   MRN: [id number] CMIS note from [facility] Care Manager received 12/24/15, D. Timmons: I made several attempts to assist this patient but her behavior prevented her from following any suggestions and offers to solve her concerns. Marland Kitchen.Marland Kitchen.Marland Kitchen.As to your referral patient seems to be able to manage storing of her medications stating she can leave her medications with her mother or daughter. She resisted or refused any instructions or offers to assist her in her housing needs. I am deferring patient but based on her long history with [facility] I am confident we will be  given another opportunity to engage with patient. Thank you again for the referral.

## 2015-12-27 ENCOUNTER — Telehealth: Payer: Self-pay | Admitting: Dietician

## 2015-12-27 NOTE — Telephone Encounter (Signed)
[name] called to report that her blood sugar is dropping in the middle of the night. She wonders if a sandwich for bedtime snack is not enough. She reports only one low recently a few days ago. She says her blood sugar was 77 and her symptoms were fighting, walking in circles, kept staring but couldn't concentrate/focus per her boyfriend and couldn't go to church.   She asked to confirm  her insulin doses which she was able to state without promting. She asked for a refill on glucagon. She was informed that she should have refills at the pharmacy and she is due for an appointment. Her call was transferred to the front desk.

## 2015-12-29 ENCOUNTER — Telehealth: Payer: Self-pay | Admitting: Dietician

## 2015-12-29 NOTE — Telephone Encounter (Signed)
Scheduled an appointment with diabetes educator same day as doctor per patient request.

## 2016-01-03 ENCOUNTER — Telehealth: Payer: Self-pay | Admitting: Dietician

## 2016-01-03 NOTE — Telephone Encounter (Signed)
Called Eddye back. She wanted us to know that her eye doctor wants her to use eye drops in both eyes. (The one she was using in her left eye ( ? Latanoprost) she now has to use in both eyes.) I told her that I would let her doctor know.

## 2016-01-03 NOTE — Telephone Encounter (Signed)
Patient left a message that she was at the eye doctor, please call.

## 2016-01-05 ENCOUNTER — Ambulatory Visit: Payer: Medicaid Other | Admitting: Dietician

## 2016-01-05 ENCOUNTER — Telehealth: Payer: Self-pay | Admitting: Dietician

## 2016-01-05 NOTE — Telephone Encounter (Signed)
[name] calls saying the Drugstore is charging her 14$ for her glucagon and eye drops, she says with Medicaid she should only pay 3 each or 6$$. She wanted me to call the drug store. I told her that she would have to do that to discuss with them why they charged her what they did. I also offered her to speak with the pharmacist tor Child psychotherapistsocial worker, but she said she'd "leave it in God's hands".

## 2016-01-06 ENCOUNTER — Ambulatory Visit (INDEPENDENT_AMBULATORY_CARE_PROVIDER_SITE_OTHER): Payer: Medicaid Other | Admitting: Internal Medicine

## 2016-01-06 ENCOUNTER — Encounter: Payer: Self-pay | Admitting: Internal Medicine

## 2016-01-06 ENCOUNTER — Encounter: Payer: Self-pay | Admitting: Dietician

## 2016-01-06 ENCOUNTER — Ambulatory Visit (INDEPENDENT_AMBULATORY_CARE_PROVIDER_SITE_OTHER): Payer: Medicaid Other | Admitting: Dietician

## 2016-01-06 VITALS — Wt 141.0 lb

## 2016-01-06 DIAGNOSIS — H6122 Impacted cerumen, left ear: Secondary | ICD-10-CM

## 2016-01-06 DIAGNOSIS — Z713 Dietary counseling and surveillance: Secondary | ICD-10-CM | POA: Diagnosis not present

## 2016-01-06 DIAGNOSIS — Z0000 Encounter for general adult medical examination without abnormal findings: Secondary | ICD-10-CM

## 2016-01-06 DIAGNOSIS — E1065 Type 1 diabetes mellitus with hyperglycemia: Principal | ICD-10-CM

## 2016-01-06 DIAGNOSIS — E108 Type 1 diabetes mellitus with unspecified complications: Secondary | ICD-10-CM | POA: Diagnosis present

## 2016-01-06 DIAGNOSIS — E103399 Type 1 diabetes mellitus with moderate nonproliferative diabetic retinopathy without macular edema, unspecified eye: Secondary | ICD-10-CM

## 2016-01-06 DIAGNOSIS — IMO0002 Reserved for concepts with insufficient information to code with codable children: Secondary | ICD-10-CM

## 2016-01-06 LAB — GLUCOSE, CAPILLARY: Glucose-Capillary: 268 mg/dL — ABNORMAL HIGH (ref 65–99)

## 2016-01-06 LAB — POCT GLYCOSYLATED HEMOGLOBIN (HGB A1C): Hemoglobin A1C: 8.9

## 2016-01-06 NOTE — Progress Notes (Signed)
Diabetes Self-Management Education  Visit Type: Follow-up  Appt. Start Time: 1330 Appt. End Time: 1410  01/06/2016  [name], identified by name and date of birth, is a 50 y.o. female with a diagnosis of Diabetes:  Marland Kitchen. Type 1 diabetes  ASSESSMENT  Weight 141 lb (63.957 kg), last menstrual period 01/02/2016. Body mass index is 23.46 kg/(m^2).  A1C improved to 8.9% Foot exam unremarkable      Diabetes Self-Management Education - 01/06/16 1400    Visit Information   Visit Type Follow-up   Health Coping   How would you rate your overall health? Good   Psychosocial Assessment   Patient Belief/Attitude about Diabetes Motivated to manage diabetes   Self-care barriers None;Lack of material resources   Self-management support Doctor's office;Family;CDE visits   Patient Concerns Healthy Lifestyle;Glycemic Control   Special Needs Simplified materials   Preferred Learning Style Auditory;Visual;Hands on   Learning Readiness Ready   How often do you need to have someone help you when you read instructions, pamphlets, or other written materials from your doctor or pharmacy? 3 - Sometimes   What is the last grade level you completed in school? --  12   Pre-Education Assessment   Patient understands the diabetes disease and treatment process. Demonstrates understanding / competency   Patient understands incorporating nutritional management into lifestyle. Needs Review   Patient undertands incorporating physical activity into lifestyle. Demonstrates understanding / competency   Patient understands using medications safely. Needs Review   Patient understands monitoring blood glucose, interpreting and using results Demonstrates understanding / competency   Patient understands prevention, detection, and treatment of acute complications. Demonstrates understanding / competency   Patient understands prevention, detection, and treatment of chronic complications. Demonstrates understanding /  competency   Patient understands how to develop strategies to address psychosocial issues. Demonstrates understanding / competency   Patient understands how to develop strategies to promote health/change behavior. Needs Review   Complications   Last HgB A1C per patient/outside source 8.9 %   How often do you check your blood sugar? 1-2 times/day   Fasting Blood glucose range (mg/dL) <16;>109<70;>200   Number of hypoglycemic episodes per month 2  2   Can you tell when your blood sugar is low? Yes   What do you do if your blood sugar is low? eat something with sugar   Number of hyperglycemic episodes per week 52   Can you tell when your blood sugar is high? Yes   What do you do if your blood sugar is high? walk and eat fewer carbs   Have you had a dilated eye exam in the past 12 months? Yes   Are you checking your feet? Yes   How many days per week are you checking your feet? 7   Dietary Intake   Breakfast 1 egg, cornflakes, skim milk,    Patient Education   [facility] Diabetes Education [facility] and here)   Nutrition management  Meal options for control of blood glucose level and chronic complications.   Medications Taught/reviewed insulin injection, site rotation, insulin storage and needle disposal.   Personal strategies to promote health Helped patient develop diabetes management plan for improving health and glycemic control by increasing fiber in diet.    Individualized Goals (developed by patient)   Nutrition General guidelines for healthy choices and portions discussed   Patient Self-Evaluation of Goals - Patient rates self as meeting previously set goals (% of time)   Nutrition >75%  Outcomes   Expected Outcomes Demonstrated interest in learning. Expect positive outcomes   Future DMSE 3-4 months   Program Status Re-entered   Subsequent Visit   Since your last visit have you continued or begun to take your medications as prescribed? Yes   Since your last visit  have you had your blood pressure checked? Yes   Is your most recent blood pressure lower, unchanged, or higher since your last visit? Lower   Since your last visit have you experienced any weight changes? No change   Since your last visit, are you checking your blood glucose at least once a day? Yes      Individualized Plan for Diabetes Self-Management Training:   Learning Objective:  Patient will have a greater understanding of diabetes self-management. Patient education plan is to attend individual and/or group sessions per assessed needs and concerns.   Plan:   There are no Patient Instructions on file for this visit.  Expected Outcomes:  Demonstrated interest in learning. Expect positive outcomes  Education material provided: Support group flyer  If problems or questions, patient to contact team via:  Phone  Future DSME appointment: 3-4 months

## 2016-01-06 NOTE — Patient Instructions (Signed)
General Instructions: - You are doing great! Keep up the great work with your diabetes. - Continue Lantus 18 units in the morning and 2 units in the evening - Follow up in 3 months  Thank you for bringing your medicines today. This helps us keep you safe from mistakes.   Progress Toward Treatment Goals:  Treatment Goal 09/07/2015  Hemoglobin A1C improved    Self Care Goals & Plans:  Self Care Goal 01/06/2016  Manage my medications take my medicines as prescribed; refill my medications on time; bring my medications to every visit  Monitor my health bring my glucose meter and log to each visit; keep track of my blood glucose; keep track of my blood pressure  Eat healthy foods drink diet soda or water instead of juice or soda; eat more vegetables  Be physically active find an activity I enjoy  Prevent falls have my vision checked    Home Blood Glucose Monitoring 09/07/2015  Check my blood sugar 3 times a day  When to check my blood sugar before meals     Care Management & Community Referrals:  Referral 09/07/2015  Referrals made for care management support nutritionist; diabetes educator

## 2016-01-10 DIAGNOSIS — H6120 Impacted cerumen, unspecified ear: Secondary | ICD-10-CM | POA: Insufficient documentation

## 2016-01-10 NOTE — Assessment & Plan Note (Signed)
Her A1c continues to improve with dietary changes. I told the patient that I was very proud of her and encouraged her to keep up the hard work. Her type 1 DM is difficult to control as short acting insulin cannot be used due to severe hypoglycemia in the past. Recommended to continue Lantus 18 units in AM and 2 units in PM. She did have a few low blood sugars but these can be explained by exercise and not eating consistently. I advised her to check her blood sugar before she goes on her daily walk and if her blood sugar is below 120 to eat a small snack. Will have her follow up in 3 months.

## 2016-01-10 NOTE — Assessment & Plan Note (Signed)
Reports she had a pap [facility] ago [facility]. However, I do not see any record of this. Her last recorded pap smear was in 2014 and was normal. Will try to find this report.

## 2016-01-10 NOTE — Progress Notes (Signed)
   Subjective:    Patient ID: [name], female    DOB: 1965/09/04, 50 y.o.   MRN: [id number]  HPI [name] is a 50yo woman with PMHx of type 1 DM and hyperlipidemia who presents today for follow up of her diabetes.  Type 1 DM: Last A1c 9.9 and today is 8.9! She is taking Lantus 18 units in the AM and 2 units in the PM. She has been working diligently with [name] on her diet. She continues to do her daily walk. She does note a few episodes of hypoglycemia after walking and when she does not eat as much at dinner. Per review of her glucometer, she has 2 lows (one at 51 and other at 7967) in the past few weeks. The 51 was in the morning and can be explained by not eating a bedtime snack when her blood sugar was 90 at bedtime. The other value of 67 can be explained by having her evening walk and then not eating much at dinner. AM blood sugars range from 120-300 and PM blood sugars range from 120s-360s. Evening blood sugars are better overall.   "Fluid in Left Ear": She reports she cannot hear well in her left year which has been ongoing for several years. She reports having an ear surgery a few years ago with Dr. Dorma RussellKraus from ENT. She feels like there is fluid present in her left ear. She denies any pain or drainage. She has tried to get wax out from her left ear without any luck. She thinks there is a large amount of ear wax causing her symptoms.    Review of Systems General: Denies fever, chills, night sweats, changes in weight, changes in appetite HEENT: Denies headaches, changes in vision, rhinorrhea, sore throat CV: Denies CP, palpitations, SOB, orthopnea Pulm: Denies SOB, cough, wheezing GI: Denies abdominal pain, nausea, vomiting, diarrhea, constipation, melena, hematochezia GU: Denies dysuria, hematuria, frequency Msk: Denies muscle cramps, joint pains Neuro: Denies weakness, numbness, tingling Skin: Denies rashes, bruising Psych: Denies depression, anxiety, hallucinations    Objective:     Physical Exam General: alert, sitting up, NAD, pleasant  HEENT: [place]/AT. EOMI, sclera anicteric. R tympanic membrane and ear canal appear normal. Left tympanic membrane is difficult to visualize due to a large amount of cerumen at the beginning of the ear canal.  CV: RRR, no m/g/r Pulm: CTA bilaterally, breaths non-labored Abd: BS+, soft, non-tender Ext: warm, no peripheral edema Neuro: alert and oriented x 3   Procedure note: Disimpaction of cerumen from left ear canal  Patient consent was obtained and all risks were explained. Patient was positioned properly in upright position. An otoscope was used to visualize the cerumen burden. A curette was used to dislodge the cerumen. A large amount was removed. Patient tolerated procedure well.     Assessment & Plan:  Please refer to A&P documentation.

## 2016-01-10 NOTE — Assessment & Plan Note (Signed)
I was able to remove most of the cerumen from her left ear canal. Advised her to not stick anything in her ears and that we can help with cerumen impaction here in the clinic. Continue to monitor.

## 2016-01-11 NOTE — Progress Notes (Signed)
Internal Medicine Clinic Attending  Case discussed with Dr. Rivet at the time of the visit.  We reviewed the resident's history and exam and pertinent patient test results.  I agree with the assessment, diagnosis, and plan of care documented in the resident's note.  

## 2016-01-17 ENCOUNTER — Ambulatory Visit: Payer: Medicaid Other

## 2016-01-17 ENCOUNTER — Encounter: Payer: Self-pay | Admitting: Dietician

## 2016-01-17 ENCOUNTER — Other Ambulatory Visit: Payer: Self-pay | Admitting: Dietician

## 2016-01-17 DIAGNOSIS — E1065 Type 1 diabetes mellitus with hyperglycemia: Principal | ICD-10-CM

## 2016-01-17 DIAGNOSIS — IMO0002 Reserved for concepts with insufficient information to code with codable children: Secondary | ICD-10-CM

## 2016-01-17 DIAGNOSIS — E103399 Type 1 diabetes mellitus with moderate nonproliferative diabetic retinopathy without macular edema, unspecified eye: Secondary | ICD-10-CM

## 2016-01-17 LAB — GLUCOSE, CAPILLARY: GLUCOSE-CAPILLARY: 73 mg/dL (ref 65–99)

## 2016-01-17 MED ORDER — GLUCOSE BLOOD VI STRP: ORAL_STRIP | Status: DC

## 2016-01-17 MED ORDER — ACCU-CHEK SOFTCLIX LANCETS MISC: Status: DC

## 2016-01-17 MED ORDER — ACCU-CHEK AVIVA PLUS W/DEVICE KIT: PACK | Status: DC

## 2016-01-17 NOTE — Progress Notes (Signed)
Patient ID: [name], female   DOB: 1966-04-14, 50 y.o.   MRN: [id number] Patient presented to office in need of a glucometer. She had one with expired strips that her insurance will not cover the supplies.  Provided patient with an Accu chek Aviva plus meter with 10 strips and will request rx for strips be sent as soon as possible.

## 2016-01-17 NOTE — Telephone Encounter (Signed)
When providing meter to patient she complained of low blood sugar symptoms. She began eating her food that she brought in with her and got choked. At that point, she began drinking Gatorade to wash it down. She was also given part of a glucose gel. Her blood sugar was 54. She drank more Gatorade. Her blood sugar on recheck was 73. She was feeling better and was advised to eat a a meal or snack within the hour.

## 2016-01-24 ENCOUNTER — Telehealth: Payer: Self-pay | Admitting: Internal Medicine

## 2016-01-24 DIAGNOSIS — E103399 Type 1 diabetes mellitus with moderate nonproliferative diabetic retinopathy without macular edema, unspecified eye: Secondary | ICD-10-CM

## 2016-01-24 DIAGNOSIS — IMO0002 Reserved for concepts with insufficient information to code with codable children: Secondary | ICD-10-CM

## 2016-01-24 DIAGNOSIS — E1065 Type 1 diabetes mellitus with hyperglycemia: Principal | ICD-10-CM

## 2016-01-24 MED ORDER — ACCU-CHEK SOFTCLIX LANCETS MISC: Status: DC

## 2016-01-24 MED ORDER — ACCU-CHEK AVIVA PLUS W/DEVICE KIT: PACK | Status: DC

## 2016-01-24 MED ORDER — GLUCOSE BLOOD VI STRP: ORAL_STRIP | Status: DC

## 2016-01-24 NOTE — Telephone Encounter (Signed)
[facility] [address] and explained the circumstances.  they said we could send a script but that if she has had a meter in the past 3 years, they will not give her one.

## 2016-01-24 NOTE — Telephone Encounter (Signed)
Patient left multiple message last week and this past weekend that her mothr took her meters and insulin on July 4th, her sugar dropped and she got incoherent, everybody was drinking and smoking cigarettes, asked for food because her blood sugar was low. Now almost 1 week later she still doesn't have her meters, eye drops or inhaler. Last took her insulin yesterday or the day before because she got more, still no meter.  she has no transportation ambulance keeps coming to her house everyday or every other day. They have been checking her blood sugar about every other day.  She is not sure she can get to her pharmacy, but  [facility], maybe shwe can change to it. Sh says she is confused and scared and not thinking right.  Blood sugar up and down. They gave her glucagon and she already used that. Has Glucose tablets.  Drinking plenty of water. Breathing not too good, food gets stuck in her throat.

## 2016-01-24 NOTE — Telephone Encounter (Signed)
Called an informed [name] that meter and supplies [facility] has [facility].  She verbalized understanding to go to the  emergency room if she feels works or has trouble breathing or becomes nauseated or cannot eat or drink.

## 2016-01-24 NOTE — Telephone Encounter (Signed)
Needs to talk to nurse °

## 2016-01-25 ENCOUNTER — Ambulatory Visit (INDEPENDENT_AMBULATORY_CARE_PROVIDER_SITE_OTHER): Payer: Medicaid Other | Admitting: Internal Medicine

## 2016-01-25 ENCOUNTER — Emergency Department (HOSPITAL_COMMUNITY)
Admission: EM | Admit: 2016-01-25 | Discharge: 2016-01-25 | Disposition: A | Discharge status: Home / Self Care | Payer: Medicaid Other | Attending: Emergency Medicine | Admitting: Emergency Medicine

## 2016-01-25 ENCOUNTER — Encounter: Payer: Self-pay | Admitting: Dietician

## 2016-01-25 ENCOUNTER — Ambulatory Visit (INDEPENDENT_AMBULATORY_CARE_PROVIDER_SITE_OTHER): Payer: Medicaid Other | Admitting: Dietician

## 2016-01-25 ENCOUNTER — Encounter (HOSPITAL_COMMUNITY): Payer: Self-pay | Admitting: Emergency Medicine

## 2016-01-25 VITALS — BP 104/65 | HR 88 | Temp 97.9°F | Wt 136.3 lb

## 2016-01-25 VITALS — BP 104/65 | HR 88 | Temp 97.9°F | Ht 65.0 in | Wt 136.3 lb

## 2016-01-25 DIAGNOSIS — I2510 Atherosclerotic heart disease of native coronary artery without angina pectoris: Secondary | ICD-10-CM | POA: Diagnosis not present

## 2016-01-25 DIAGNOSIS — E1065 Type 1 diabetes mellitus with hyperglycemia: Secondary | ICD-10-CM

## 2016-01-25 DIAGNOSIS — E103399 Type 1 diabetes mellitus with moderate nonproliferative diabetic retinopathy without macular edema, unspecified eye: Secondary | ICD-10-CM

## 2016-01-25 DIAGNOSIS — IMO0002 Reserved for concepts with insufficient information to code with codable children: Secondary | ICD-10-CM

## 2016-01-25 DIAGNOSIS — Z713 Dietary counseling and surveillance: Secondary | ICD-10-CM

## 2016-01-25 DIAGNOSIS — Z87891 Personal history of nicotine dependence: Secondary | ICD-10-CM | POA: Insufficient documentation

## 2016-01-25 DIAGNOSIS — E10649 Type 1 diabetes mellitus with hypoglycemia without coma: Secondary | ICD-10-CM | POA: Insufficient documentation

## 2016-01-25 DIAGNOSIS — Z955 Presence of coronary angioplasty implant and graft: Secondary | ICD-10-CM | POA: Diagnosis not present

## 2016-01-25 DIAGNOSIS — I10 Essential (primary) hypertension: Secondary | ICD-10-CM | POA: Diagnosis not present

## 2016-01-25 DIAGNOSIS — Z7982 Long term (current) use of aspirin: Secondary | ICD-10-CM | POA: Insufficient documentation

## 2016-01-25 DIAGNOSIS — E162 Hypoglycemia, unspecified: Secondary | ICD-10-CM

## 2016-01-25 DIAGNOSIS — E860 Dehydration: Secondary | ICD-10-CM | POA: Insufficient documentation

## 2016-01-25 LAB — COMPREHENSIVE METABOLIC PANEL
ALT: 16 U/L (ref 14–54)
ANION GAP: 6 (ref 5–15)
AST: 22 U/L (ref 15–41)
Albumin: 3.5 g/dL (ref 3.5–5.0)
Alkaline Phosphatase: 58 U/L (ref 38–126)
BUN: 16 mg/dL (ref 6–20)
CHLORIDE: 105 mmol/L (ref 101–111)
CO2: 25 mmol/L (ref 22–32)
Calcium: 8.4 mg/dL — ABNORMAL LOW (ref 8.9–10.3)
Creatinine, Ser: 0.66 mg/dL (ref 0.44–1.00)
GFR calc non Af Amer: >60 mL/min (ref >60)
Glucose, Bld: 157 mg/dL — ABNORMAL HIGH (ref 65–99)
Potassium: 3.4 mmol/L — ABNORMAL LOW (ref 3.5–5.1)
SODIUM: 136 mmol/L (ref 135–145)
Total Bilirubin: 0.5 mg/dL (ref 0.3–1.2)
Total Protein: 7.2 g/dL (ref 6.5–8.1)

## 2016-01-25 LAB — CBC WITH DIFFERENTIAL/PLATELET
BASOS PCT: 0 %
Basophils Absolute: 0 10*3/uL (ref 0.0–0.1)
EOS ABS: 0.1 10*3/uL (ref 0.0–0.7)
Eosinophils Relative: 1 %
HCT: 34.5 % — ABNORMAL LOW (ref 36.0–46.0)
Hemoglobin: 10.7 g/dL — ABNORMAL LOW (ref 12.0–15.0)
LYMPHS ABS: 1.6 10*3/uL (ref 0.7–4.0)
Lymphocytes Relative: 17 %
MCH: 19.9 pg — AB (ref 26.0–34.0)
MCHC: 31 g/dL (ref 30.0–36.0)
MCV: 64.2 fL — ABNORMAL LOW (ref 78.0–100.0)
MONO ABS: 0.4 10*3/uL (ref 0.1–1.0)
Monocytes Relative: 4 %
NEUTROS ABS: 7.5 10*3/uL (ref 1.7–7.7)
NEUTROS PCT: 78 %
PLATELETS: 207 10*3/uL (ref 150–400)
RBC: 5.37 MIL/uL — ABNORMAL HIGH (ref 3.87–5.11)
RDW: 16.4 % — AB (ref 11.5–15.5)
WBC: 9.6 10*3/uL (ref 4.0–10.5)

## 2016-01-25 LAB — URINALYSIS, ROUTINE W REFLEX MICROSCOPIC
Bilirubin Urine: NEGATIVE
Glucose, UA: 500 mg/dL — AB
Hgb urine dipstick: NEGATIVE
KETONES UR: NEGATIVE mg/dL
LEUKOCYTES UA: NEGATIVE
NITRITE: NEGATIVE
PH: 7 (ref 5.0–8.0)
Protein, ur: NEGATIVE mg/dL
SPECIFIC GRAVITY, URINE: 1.037 — AB (ref 1.005–1.030)

## 2016-01-25 LAB — RAPID URINE DRUG SCREEN, HOSP PERFORMED
AMPHETAMINES: NOT DETECTED
Barbiturates: NOT DETECTED
Benzodiazepines: NOT DETECTED
COCAINE: NOT DETECTED
OPIATES: NOT DETECTED
TETRAHYDROCANNABINOL: NOT DETECTED

## 2016-01-25 LAB — ETHANOL

## 2016-01-25 LAB — CBG MONITORING, ED
GLUCOSE-CAPILLARY: 275 mg/dL — AB (ref 65–99)
Glucose-Capillary: 210 mg/dL — ABNORMAL HIGH (ref 65–99)
Glucose-Capillary: 226 mg/dL — ABNORMAL HIGH (ref 65–99)

## 2016-01-25 LAB — GLUCOSE, CAPILLARY: GLUCOSE-CAPILLARY: 105 mg/dL — AB (ref 65–99)

## 2016-01-25 MED ORDER — DEXTROSE 50 % IV SOLN
INTRAVENOUS | Status: AC
  Administered 2016-01-25: 50 mL
  Filled 2016-01-25: qty 50

## 2016-01-25 MED ORDER — GLUCAGON HCL RDNA (DIAGNOSTIC) 1 MG IJ SOLR
INTRAMUSCULAR | Status: AC
  Administered 2016-01-25: 1 mg
  Filled 2016-01-25: qty 1

## 2016-01-25 MED ORDER — DEXTROSE 50 % IV SOLN
INTRAVENOUS | Status: AC
  Filled 2016-01-25: qty 50

## 2016-01-25 MED ORDER — GLUCOSE 40 % PO GEL
ORAL | Status: AC
  Administered 2016-01-25: 37.5 g
  Filled 2016-01-25: qty 1

## 2016-01-25 MED ORDER — SODIUM CHLORIDE 0.9 % IV BOLUS (SEPSIS)
500.0000 mL | Freq: Once | INTRAVENOUS | Status: AC
  Administered 2016-01-25: 500 mL via INTRAVENOUS

## 2016-01-25 NOTE — ED Notes (Signed)
Pt wheeled out to bus stop with belongings and given a bus pass and a sandwich

## 2016-01-25 NOTE — Patient Instructions (Signed)
Thank you for your visit today Please continue to take your 18 units of Lantus in the morning and 2 units at night. Please check your blood sugar, now that you have the meter. Please follow up with Dr. Beckie Saltsivet

## 2016-01-25 NOTE — ED Notes (Signed)
Pt resting quietly at this time with no complaints voiced 

## 2016-01-25 NOTE — Assessment & Plan Note (Signed)
Pt is here because last week, she was at her mother's house, and her mother took her insulin, as well as her meter. She had called Ms. [name], and Ms Lupita LeashDonna sent the insulin to the pharmacy, so she was able to get access to the insulin, but was not able to check her CBGs. She said she did not know if she had any hypoglycemic episodes last week, but did feel aggressive sometimes, and felt dizzy on occasion. Her daughter was in the waiting room today, and just brought the meter. I told her that if she thought she had hypoglycemia, then I would go down on the basal Lantus in the morning. She was extremely resistant to the idea and feels that she is on the optimal insulin regimen right now.  I was also concerned about the living situation since her mother took the insulin from her. I spoke with Ms Lupita LeashDonna as she knows the patient for a long time and she told me that the patient's living situation is optimal right now and she does not live with her mother, only her boyfriend, who is supportive. This happens whenever she goes to visit her mother's house. So there is not a lot we can do except to advise the patient to avoid situations like this.   So, I asked her to continue taking Lantus 18 units AM, and 2 units PM as originally prescribed.  I also wanted to do a BMET since she lost a few pounds, but she was in a hurry for her SCAT. Her vitals were stable, and on HEENT exam, there was MMM so she did not look dehydrated to me, and her CBG in the clinic was 105.  She will follow up with her PCP.

## 2016-01-25 NOTE — Discharge Instructions (Signed)
Dehydration, Adult Dehydration is a condition in which you do not have enough fluid or water in your body. It happens when you take in less fluid than you lose. Vital organs such as the kidneys, brain, and heart cannot function without a proper amount of fluids. Any loss of fluids from the body can cause dehydration.  Dehydration can range from mild to severe. This condition should be treated right away to help prevent it from becoming severe. CAUSES  This condition may be caused by:  Vomiting.  Diarrhea.  Excessive sweating, such as when exercising in hot or humid weather.  Not drinking enough fluid during strenuous exercise or during an illness.  Excessive urine output.  Fever.  Certain medicines. RISK FACTORS This condition is more likely to develop in:  People who are taking certain medicines that cause the body to lose excess fluid (diuretics).   People who have a chronic illness, such as diabetes, that may increase urination.  Older adults.   People who live at high altitudes.   People who participate in endurance sports.  SYMPTOMS  Mild Dehydration  Thirst.  Dry lips.  Slightly dry mouth.  Dry, warm skin. Moderate Dehydration  Very dry mouth.   Muscle cramps.   Dark urine and decreased urine production.   Decreased tear production.   Headache.   Light-headedness, especially when you stand up from a sitting position.  Severe Dehydration  Changes in skin.   Cold and clammy skin.   Skin does not spring back quickly when lightly pinched and released.   Changes in body fluids.   Extreme thirst.   No tears.   Not able to sweat when body temperature is high, such as in hot weather.   Minimal urine production.   Changes in vital signs.   Rapid, weak pulse (more than 100 beats per minute when you are sitting still).   Rapid breathing.   Low blood pressure.   Other changes.   Sunken eyes.   Cold hands and feet.    Confusion.  Lethargy and difficulty being awakened.  Fainting (syncope).   Short-term weight loss.   Unconsciousness. DIAGNOSIS  This condition may be diagnosed based on your symptoms. You may also have tests to determine how severe your dehydration is. These tests may include:   Urine tests.   Blood tests.  TREATMENT  Treatment for this condition depends on the severity. Mild or moderate dehydration can often be treated at home. Treatment should be started right away. Do not wait until dehydration becomes severe. Severe dehydration needs to be treated at the hospital. Treatment for Mild Dehydration  Drinking plenty of water to replace the fluid you have lost.   Replacing minerals in your blood (electrolytes) that you may have lost.  Treatment for Moderate Dehydration  Consuming oral rehydration solution (ORS). Treatment for Severe Dehydration  Receiving fluid through an IV tube.   Receiving electrolyte solution through a feeding tube that is passed through your nose and into your stomach (nasogastric tube or NG tube).  Correcting any abnormalities in electrolytes. HOME CARE INSTRUCTIONS   Drink enough fluid to keep your urine clear or pale yellow.   Drink water or fluid slowly by taking small sips. You can also try sucking on ice cubes.  Have food or beverages that contain electrolytes. Examples include bananas and sports drinks.  Take over-the-counter and prescription medicines only as told by your health care provider.   Prepare ORS according to the manufacturer's instructions. Take sips  of ORS every 5 minutes until your urine returns to normal.  If you have vomiting or diarrhea, continue to try to drink water, ORS, or both.   If you have diarrhea, avoid:   Beverages that contain caffeine.   Fruit juice.   Milk.   Carbonated soft drinks.  Do not take salt tablets. This can lead to the condition of having too much sodium in your body  (hypernatremia).  SEEK MEDICAL CARE IF:  You cannot eat or drink without vomiting.  You have had moderate diarrhea during a period of more than 24 hours.  You have a fever. SEEK IMMEDIATE MEDICAL CARE IF:   You have extreme thirst.  You have severe diarrhea.  You have not urinated in 6-8 hours, or you have urinated only a small amount of very dark urine.  You have shriveled skin.  You are dizzy, confused, or both.   This information is not intended to replace advice given to you by your health care provider. Make sure you discuss any questions you have with your health care provider.   Document Released: 07/03/2005 Document Revised: 03/24/2015 Document Reviewed: 11/18/2014 Elsevier Interactive Patient Education 2016 Else[employer] Inc[employer]  Hypoglycemia Hypoglycemia occurs when the glucose in your blood is too low. Glucose is a type of sugar that is your body's main energy source. Hormones, such as insulin and glucagon, control the level of glucose in the blood. Insulin lowers blood glucose and glucagon increases blood glucose. Having too much insulin in your blood stream, or not eating enough food containing sugar, can result in hypoglycemia. Hypoglycemia can happen to people with or without diabetes. It can develop quickly and can be a medical emergency.  CAUSES   Missing or delaying meals.  Not eating enough carbohydrates at meals.  Taking too much diabetes medicine.  Not timing your oral diabetes medicine or insulin doses with meals, snacks, and exercise.  Nausea and vomiting.  Certain medicines.  Severe illnesses, such as hepatitis, kidney disorders, and certain eating disorders.  Increased activity or exercise without eating something extra or adjusting medicines.  Drinking too much alcohol.  A nerve disorder that affects body functions like your heart rate, blood pressure, and digestion (autonomic neuropathy).  A condition where the stomach muscles do not function  properly (gastroparesis). Therefore, medicines and food may not absorb properly.  Rarely, a tumor of the pancreas can produce too much insulin. SYMPTOMS   Hunger.  Sweating (diaphoresis).  Change in body temperature.  Shakiness.  Headache.  Anxiety.  Lightheadedness.  Irritability.  Difficulty concentrating.  Dry mouth.  Tingling or numbness in the hands or feet.  Restless sleep or sleep disturbances.  Altered speech and coordination.  Change in mental status.  Seizures or prolonged convulsions.  Combativeness.  Drowsiness (lethargic).  Weakness.  Increased heart rate or palpitations.  Confusion.  Pale, gray skin color.  Blurred or double vision.  Fainting. DIAGNOSIS  A physical exam and medical history will be performed. Your caregiver may make a diagnosis based on your symptoms. Blood tests and other lab tests may be performed to confirm a diagnosis. Once the diagnosis is made, your caregiver will see if your signs and symptoms go away once your blood glucose is raised.  TREATMENT  Usually, you can easily treat your hypoglycemia when you notice symptoms.  Check your blood glucose. If it is less than 70 mg/dl, take one of the following:   3-4 glucose tablets.    cup juice.    cup regular  soda.   1 cup skim milk.   -1 tube of glucose gel.   5-6 hard candies.   Avoid high-fat drinks or food that may delay a rise in blood glucose levels.  Do not take more than the recommended amount of sugary foods, drinks, gel, or tablets. Doing so will cause your blood glucose to go too high.   Wait 10-15 minutes and recheck your blood glucose. If it is still less than 70 mg/dl or below your target range, repeat treatment.   Eat a snack if it is more than 1 hour until your next meal.  There may be a time when your blood glucose may go so low that you are unable to treat yourself at home when you start to notice symptoms. You may need someone to  help you. You may even faint or be unable to swallow. If you cannot treat yourself, someone will need to bring you to the hospital.  HOME CARE INSTRUCTIONS  If you have diabetes, follow your diabetes management plan by:  Taking your medicines as directed.  Following your exercise plan.  Following your meal plan. Do not skip meals. Eat on time.  Testing your blood glucose regularly. Check your blood glucose before and after exercise. If you exercise longer or different than usual, be sure to check blood glucose more frequently.  Wearing your medical alert jewelry that says you have diabetes.  Identify the cause of your hypoglycemia. Then, develop ways to prevent the recurrence of hypoglycemia.  Do not take a hot bath or shower right after an insulin shot.  Always carry treatment with you. Glucose tablets are the easiest to carry.  If you are going to drink alcohol, drink it only with meals.  Tell friends or family members ways to keep you safe during a seizure. This may include removing hard or sharp objects from the area or turning you on your side.  Maintain a healthy weight. SEEK MEDICAL CARE IF:   You are having problems keeping your blood glucose in your target range.  You are having frequent episodes of hypoglycemia.  You feel you might be having side effects from your medicines.  You are not sure why your blood glucose is dropping so low.  You notice a change in vision or a new problem with your vision. SEEK IMMEDIATE MEDICAL CARE IF:   Confusion develops.  A change in mental status occurs.  The inability to swallow develops.  Fainting occurs.   This information is not intended to replace advice given to you by your health care provider. Make sure you discuss any questions you have with your health care provider.   Document Released: 07/03/2005 Document Revised: 07/08/2013 Document Reviewed: 03/09/2015 Elsevier Interactive Patient Education [employer]2016 Elsevier  Inc.

## 2016-01-25 NOTE — ED Notes (Signed)
Pt found in front of hospital acting strange and very diaphoretic.  Pt was brought to ED and CBG was checked.  CBG was 20.  Attempted to start IV but pt was very uncooperative.  Pt was given 2 tubes of glutose 15  And 1 amp of D50 pt now awake and talking.  Pt st's she was trying to get to her MD's appt.,

## 2016-01-25 NOTE — ED Notes (Signed)
Pt agreeable for phlebotomy to get labs
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
## 2016-01-25 NOTE — Progress Notes (Signed)
Diabetes Self-Management Education  Visit Type:     Appt. Start Time: 1300 Appt. End Time: 0623  01/25/2016  [name], identified by name and date of birth, is a 50 y.o. female with a diagnosis of Diabetes:  Marland Kitchen  Type 1 diabetes since age 46 ASSESSMENT Patient presented for her appointment today and had the meter that was given to her last Monday. Her daughter met her here at the office with it. She checked her blood sugar and it was 128. She seemed calmer after knowing that her blood sugar was okay. Vitals done. Her weight has dropped ~ 5# acutely. She reports that for the past week she has been trying to exercise,  eat low carb diet, take her insulin as instructed and drink plenty of water. She says she doesn't feel that she is thinking clearly and that she is dehydrated.   Blood pressure 104/65, pulse 88, temperature 97.9 F (36.6 C), temperature source Oral, weight 136 lb 4.8 oz (61.825 kg), last menstrual period 01/02/2016, SpO2 100 %. Body mass index is 22.68 kg/(m^2).       Diabetes Self-Management Education - 01/25/16 1500    Health Coping   How would you rate your overall health? (p) Fair   Pre-Education Assessment   Patient understands incorporating nutritional management into lifestyle. (p) Demonstrates understanding / competency   Patient understands using medications safely. (p) Demonstrates understanding / competency   Patient understands how to develop strategies to promote health/change behavior. (p) Demonstrates understanding / competency   Complications   Fasting Blood glucose range (mg/dL) (p) 70-129   Patient Education   Personal strategies to promote health (p) Helped patient develop diabetes management plan for (enter comment)   Individualized Goals (developed by patient)   Nutrition (p) General guidelines for healthy choices and portions discussed   Patient Self-Evaluation of Goals - Patient rates self as meeting previously set goals (% of time)   Nutrition  (p) >75%   Outcomes   Program Status (p) Not Completed   Subsequent Visit   Since your last visit have you continued or begun to take your medications as prescribed? (p) Yes   Since your last visit have you had your blood pressure checked? (p) No   Is your most recent blood pressure lower, unchanged, or higher since your last visit? (p) N/A   Since your last visit have you experienced any weight changes? (p) Loss   Weight Loss (lbs) (p) 5   Since your last visit, are you checking your blood glucose at least once a day? (p) No      Learning Objective:  Patient will have a greater understanding of diabetes self-management. Patient education plan: meal planning, medications and  Developing strategies to promote health/behavior change  My plan to support myself in continuing these changes to care for my diabetes is to attend or contact:   local support resources -doctor's office, CDE, Dietitian, pharmacist, church  Plan:   Patient Instructions  We are looking in to a CGM= Continuous Glucose meter- (288 blood sugar readings per day) for you.   Keep up the great work of caring for your diabetes by eating healthy, exercising, checking your blood sugar and taking your medicine. AND being so organized that you bring to the office with you!    call anytime  Butch Penny [phone]  Expected Outcomes:    Education material provided: My Plate If problems or questions, patient to contact team via:  Phone Future DSME appointment: -  7-8 weeks

## 2016-01-25 NOTE — ED Notes (Signed)
Pt refused lab draw.  Dr. Ranae PalmsYelverton made aware

## 2016-01-25 NOTE — ED Notes (Signed)
CBG 210 at this time.  Pt awake and alert.  No complaints voiced at this time

## 2016-01-25 NOTE — Patient Instructions (Signed)
We are looking in to a CGM= Continuous Glucose meter- (288 blood sugar readings per day) for you.   Keep up the great work of caring for your diabetes by eating healthy, exercising, checking your blood sugar and taking your medicine. AND being so organized that you bring to the office with you!    call anytime  Lupita LeashDonna [phone]
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
## 2016-01-26 ENCOUNTER — Telehealth: Payer: Self-pay | Admitting: Dietician

## 2016-01-26 NOTE — Telephone Encounter (Addendum)
Patient returned call: she reports that   Her blood sugar at 223 pm today was 128, ate cheese toast, applesauce, 1 slice bacon and boiled egg at 10 am, has not had lunch yet. She put her daughter on the phone who asked why is her mother's sugar is  dropping when she is asleep and then she is sick when she wakes, this happens 1-2 twice a week. Jasmon agreed and repeated back instructions to decrease her Lantus to 16 units in the am, 2 units per Dr. Larena Soxivet's order via inbasket message yesterday and check her blood sugar more often (at least 3-4x/day) she is at increased risk of hypos for the next 24-48 hrs. Appointment with CDE scheduled for next Monday at 2 PM .

## 2016-01-26 NOTE — Progress Notes (Signed)
Internal Medicine Clinic Attending  Case discussed with Dr. Johnny BridgeSaraiya soon after the resident saw the patient.  We reviewed the resident's history and exam and pertinent patient test results.  I agree with the assessment, diagnosis, and plan of care documented in the resident's note. [name] struggles with difficult to control DM with extreme variations in her blood sugar, we do allow for hyperglycemia to prevent Hypoglycemia and she no longer takes short acting insulin.  We will continue to try to support her thru difficult social situations.  Unfortunately despite normal CBG in our office after leaving the office she was found unresponsive in the hospital and was taken to the ED and treated for hyperglycemia.  We will try to schedule her back with her PCP Dr Beckie Saltsivet as soon as her schedule allows and have our diabetes coordinator follow up to try to ensure she eats meals at regular intervals.

## 2016-01-26 NOTE — Telephone Encounter (Signed)
Called to follow up on ED visit and severe hypoglycemia. Left message for patient to call to schedule an appointment with me next week.

## 2016-01-27 ENCOUNTER — Telehealth: Payer: Self-pay | Admitting: Dietician

## 2016-01-27 NOTE — Telephone Encounter (Signed)
[name] called to let us know that her meter read "hi" this am so she took 18 units Lantus. When asked what happened last night she said, her blood sugar was 72 last night and she had the ambulance man call us. She says they gave her glucagon and she ate peanut butter sandwich and orange juice.  She was advised to drink plenty of water today, eat her regular meals, check her blood sugar more often and come to the office or go to the emergency room if it doesn't come down. She verbalized understanding.

## 2016-01-27 NOTE — Telephone Encounter (Signed)
Message left on voicemail: At 1143 PM last night. He was calling to make [name] aware that she had a diabetic emergency and they were able to get her blood sugar up and she is not going to have to go for treatment to the emergency room.  He said [name] wanted to make [name] aware of it.

## 2016-01-31 ENCOUNTER — Ambulatory Visit: Payer: Medicaid Other | Admitting: Dietician

## 2016-02-01 ENCOUNTER — Telehealth: Payer: Self-pay | Admitting: Dietician
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
## 2016-02-03 ENCOUNTER — Telehealth: Payer: Self-pay | Admitting: Dietician

## 2016-02-03 NOTE — Telephone Encounter (Signed)
Patient called for assistance in [facility]. [facility] [address] and ask to have her prescriptions transferred to them.

## 2016-02-04 ENCOUNTER — Other Ambulatory Visit: Payer: Self-pay | Admitting: Dietician

## 2016-02-04 ENCOUNTER — Telehealth: Payer: Self-pay | Admitting: Internal Medicine

## 2016-02-04 ENCOUNTER — Other Ambulatory Visit: Payer: Self-pay | Admitting: *Deleted

## 2016-02-04 DIAGNOSIS — IMO0002 Reserved for concepts with insufficient information to code with codable children: Secondary | ICD-10-CM

## 2016-02-04 DIAGNOSIS — E1065 Type 1 diabetes mellitus with hyperglycemia: Principal | ICD-10-CM

## 2016-02-04 DIAGNOSIS — E103399 Type 1 diabetes mellitus with moderate nonproliferative diabetic retinopathy without macular edema, unspecified eye: Secondary | ICD-10-CM

## 2016-02-04 NOTE — Telephone Encounter (Signed)
Patient calls asking for assistance in [facility] [address]. Spoke to triage nurses to coordinate care and will route this note to them..Marland Kitchen

## 2016-02-04 NOTE — Telephone Encounter (Signed)
No problem to add the [facility] near her home to her pharmacy list

## 2016-02-04 NOTE — Telephone Encounter (Signed)
Pending refill from Dr.

## 2016-02-04 NOTE — Telephone Encounter (Signed)
Patient says she needs insulin and glucagon transferred as soon as possible. Told her I would let the triage nurses know.  She also asks to speak with our Child psychotherapistsocial worker ( says she needs more help). I told her it would be Monday before our social worker got this message.

## 2016-02-05 MED ORDER — GLUCOSE 40 % PO GEL: 1.0000 | ORAL | Status: AC | PRN

## 2016-02-05 MED ORDER — INSULIN SYRINGE-NEEDLE U-100 31G X 15/64" 0.3 ML MISC
Status: DC
Start: 2016-02-05 — End: 2016-03-25

## 2016-02-05 MED ORDER — ALBUTEROL SULFATE HFA 108 (90 BASE) MCG/ACT IN AERS: 2.0000 | INHALATION_SPRAY | Freq: Four times a day (QID) | RESPIRATORY_TRACT | Status: DC | PRN

## 2016-02-05 MED ORDER — ASPIRIN 81 MG PO TABS: 81.0000 mg | ORAL_TABLET | Freq: Every day | ORAL | Status: AC

## 2016-02-07 ENCOUNTER — Encounter: Payer: Self-pay | Admitting: Licensed Clinical Social Worker

## 2016-02-07 ENCOUNTER — Telehealth: Payer: Self-pay | Admitting: Licensed Clinical Social Worker

## 2016-02-07 NOTE — Telephone Encounter (Signed)
Please see CSW telephone note 02/04/16.

## 2016-02-07 NOTE — Telephone Encounter (Signed)
CSW placed call to [name].  Pt requesting letter regarding the care she requires and is in need of due to her chronic illness to be sent to "the magistrate's office" on behalf of [name].  Pt provided address and requested copy to be mailed to her address.  Pt notified, CSW will mail ROI and envelope for return and request letter from PCP.  Address confirmed.  [name] denied add'l need for services, stating she has been taking her medication and continues to receives assistance from caregiver (agency) and family.

## 2016-02-10 ENCOUNTER — Telehealth: Payer: Self-pay | Admitting: Dietician

## 2016-02-10 NOTE — Telephone Encounter (Signed)
[name] called at 11 PM last night leaving a message that she was feeling sick with a blood sugar of 445 mg/dl.  Returned call today:she says she is doing alright, blood sugar 74 this this am. Took extra lantus last night, it was hard to make out how much.   Advised her to eat well today and check her blood sugar more frequently. She verbalized understanding using teachback. She also reports that she has gotten all her prescriptions transferred successfully.

## 2016-02-11 ENCOUNTER — Telehealth: Payer: Self-pay

## 2016-02-11 DIAGNOSIS — E103399 Type 1 diabetes mellitus with moderate nonproliferative diabetic retinopathy without macular edema, unspecified eye: Secondary | ICD-10-CM

## 2016-02-11 DIAGNOSIS — IMO0002 Reserved for concepts with insufficient information to code with codable children: Secondary | ICD-10-CM

## 2016-02-11 DIAGNOSIS — E1065 Type 1 diabetes mellitus with hyperglycemia: Principal | ICD-10-CM

## 2016-02-11 NOTE — Telephone Encounter (Signed)
Patient calls saying she needs "one touch ultra test strips".  I informed her that her insurance only covers Accu chek brand and she can go to her pharmacy and get some. She verbalized understanding.

## 2016-02-11 NOTE — Telephone Encounter (Signed)
Requesting test strips and eye drop to be filled.

## 2016-02-12 MED ORDER — GLUCOSE BLOOD VI STRP: ORAL_STRIP | 12 refills | Status: DC

## 2016-02-12 MED ORDER — BRIMONIDINE TARTRATE 0.2 % OP SOLN: 1.0000 [drp] | Freq: Three times a day (TID) | OPHTHALMIC | 5 refills | Status: AC

## 2016-02-12 MED ORDER — BRIMONIDINE TARTRATE-TIMOLOL 0.2-0.5 % OP SOLN: 1.0000 [drp] | Freq: Two times a day (BID) | OPHTHALMIC | 5 refills | Status: AC

## 2016-02-12 NOTE — Telephone Encounter (Signed)
FYI: I just refilled test strips 3 weeks ago so should already be in. I refilled again just in case.

## 2016-02-14 NOTE — Telephone Encounter (Signed)
Patient called and left message again asking for chemstrips

## 2016-02-14 NOTE — Telephone Encounter (Signed)
Pt declined community care management from [facility] earlier in the year.  Primary caregiver ("spouse") currently out of the home d/t legal/court issues.  Pt still reports having [facility] aid.

## 2016-02-14 NOTE — Telephone Encounter (Signed)
[facility] [address] to be sure they can fill her chemstrips and her eye drop prescriptions and there is not a problem: they said they need her to bring in her insurance information.  Patient called and notified via message on her voicemail.

## 2016-02-15 ENCOUNTER — Other Ambulatory Visit: Payer: Self-pay | Admitting: *Deleted

## 2016-02-15 DIAGNOSIS — D509 Iron deficiency anemia, unspecified: Secondary | ICD-10-CM

## 2016-02-15 NOTE — Telephone Encounter (Signed)
Received several voicemail messages from patient about the same requests she had left messages on triage line. Discussed care with triage nurses who have already contacted patient and pharmacy.

## 2016-02-15 NOTE — Telephone Encounter (Signed)
[facility]. Gave patient contact information for [facility] and asked her to call and have her prescriptions transferred. Patient agreed and also verified her next appointment time with [place].

## 2016-02-15 NOTE — Telephone Encounter (Signed)
[facility] and they have not yet received a call from the patient. Requested that they contact [facility] and have the eye drop RX transferred.

## 2016-02-18 ENCOUNTER — Other Ambulatory Visit: Payer: Self-pay | Admitting: Internal Medicine

## 2016-02-18 NOTE — Telephone Encounter (Signed)
Patient called and said she got her test strips, but still has not received her latanoprost eye drops or Ferrous sulfate. She said she would try calling Dr. Telford Nab office about her eye drops because she had already called [facility] and asked them to transfer her prescription. I also told her that I would let the triage nurses know.

## 2016-02-21 ENCOUNTER — Telehealth: Payer: Self-pay | Admitting: Dietician

## 2016-02-21 MED ORDER — FERROUS SULFATE 325 (65 FE) MG PO TABS: 325.0000 mg | ORAL_TABLET | Freq: Two times a day (BID) | ORAL | 11 refills | Status: AC

## 2016-02-21 NOTE — Telephone Encounter (Signed)
Patient called and left messages over the weekend that she is looking for a new place to live and that she had left a message for our social worker.  She also called this morning saying she was having a low blood sugar, had treated it with a cookie and has soda and juice and  someone was with her and cooking her breakfast. She said she  was okay and did not need to call 911.  Discussed calls with social work.

## 2016-02-21 NOTE — Telephone Encounter (Signed)
Per pt request, letter mailed to pt's address and address [place].

## 2016-03-15 ENCOUNTER — Telehealth: Payer: Self-pay | Admitting: Dietician

## 2016-03-15 NOTE — Telephone Encounter (Signed)
CDE received many voicemail messages while on vacation. Patient requested help from our social worker to help get her husband/caregiver out of incarceration and to get a ramp in her new home, getting eye drops, and requesting caregiver because of low blood sugars  Home.   Returned her call, she acknowledged getting a letter from our Child psychotherapistsocial worker, and is still asking for assistance for self care.  She asked the socail worker to call her. Informed her that I will pass her message on.

## 2016-03-17 ENCOUNTER — Telehealth: Payer: Self-pay | Admitting: Dietician

## 2016-03-17 NOTE — Telephone Encounter (Signed)
[facility] many strips she has been getting. They  dispensed 100 strips for 20 day supply on 03/07/16. She has plenty of refills, she has to request them and pick them up.  Informed patient of same and she verbalized understanding. She agreed to ask [facility] regarding her iron tablets.
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
## 2016-03-17 NOTE — Telephone Encounter (Signed)
Referral to [facility] completed.

## 2016-03-17 NOTE — Telephone Encounter (Signed)
Referral to [facility] completed. 

## 2016-03-21 ENCOUNTER — Telehealth: Payer: Self-pay | Admitting: Dietician
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
## 2016-03-23 ENCOUNTER — Emergency Department (HOSPITAL_COMMUNITY)
Admission: EM | Admit: 2016-03-23 | Discharge: 2016-03-23 | Disposition: A | Discharge status: Home / Self Care | Payer: Medicaid Other | Attending: Emergency Medicine | Admitting: Emergency Medicine

## 2016-03-23 ENCOUNTER — Encounter (HOSPITAL_COMMUNITY): Payer: Self-pay | Admitting: *Deleted

## 2016-03-23 ENCOUNTER — Emergency Department (HOSPITAL_COMMUNITY): Payer: Medicaid Other

## 2016-03-23 ENCOUNTER — Telehealth: Payer: Self-pay | Admitting: Dietician

## 2016-03-23 DIAGNOSIS — Z87891 Personal history of nicotine dependence: Secondary | ICD-10-CM | POA: Insufficient documentation

## 2016-03-23 DIAGNOSIS — I10 Essential (primary) hypertension: Secondary | ICD-10-CM | POA: Insufficient documentation

## 2016-03-23 DIAGNOSIS — R4182 Altered mental status, unspecified: Secondary | ICD-10-CM

## 2016-03-23 DIAGNOSIS — I2510 Atherosclerotic heart disease of native coronary artery without angina pectoris: Secondary | ICD-10-CM | POA: Diagnosis not present

## 2016-03-23 DIAGNOSIS — Z7982 Long term (current) use of aspirin: Secondary | ICD-10-CM | POA: Diagnosis not present

## 2016-03-23 DIAGNOSIS — E109 Type 1 diabetes mellitus without complications: Secondary | ICD-10-CM | POA: Diagnosis not present

## 2016-03-23 DIAGNOSIS — Z79899 Other long term (current) drug therapy: Secondary | ICD-10-CM | POA: Insufficient documentation

## 2016-03-23 DIAGNOSIS — R52 Pain, unspecified: Secondary | ICD-10-CM

## 2016-03-23 DIAGNOSIS — E039 Hypothyroidism, unspecified: Secondary | ICD-10-CM | POA: Insufficient documentation

## 2016-03-23 LAB — URINALYSIS, ROUTINE W REFLEX MICROSCOPIC
BILIRUBIN URINE: NEGATIVE
GLUCOSE, UA: 100 mg/dL — AB
HGB URINE DIPSTICK: NEGATIVE
Ketones, ur: NEGATIVE mg/dL
Nitrite: NEGATIVE
PROTEIN: 30 mg/dL — AB
Specific Gravity, Urine: 1.022 (ref 1.005–1.030)
pH: 6 (ref 5.0–8.0)

## 2016-03-23 LAB — CBG MONITORING, ED: GLUCOSE-CAPILLARY: 97 mg/dL (ref 65–99)

## 2016-03-23 LAB — RAPID URINE DRUG SCREEN, HOSP PERFORMED
Amphetamines: NOT DETECTED
BARBITURATES: NOT DETECTED
BENZODIAZEPINES: NOT DETECTED
Cocaine: NOT DETECTED
Opiates: NOT DETECTED
Tetrahydrocannabinol: NOT DETECTED

## 2016-03-23 LAB — URINE MICROSCOPIC-ADD ON

## 2016-03-23 MED ORDER — SODIUM CHLORIDE 0.9 % IV BOLUS (SEPSIS): 1000.0000 mL | Freq: Once | INTRAVENOUS | Status: DC

## 2016-03-23 MED ORDER — SODIUM CHLORIDE 0.9 % IV SOLN: INTRAVENOUS | Status: DC

## 2016-03-23 NOTE — ED Notes (Signed)
Pt transported to CT ?

## 2016-03-23 NOTE — Telephone Encounter (Signed)
Agree with plan and quick work by CDE.  Thank you Lupita LeashDonna!

## 2016-03-23 NOTE — ED Notes (Signed)
Unable to collect patient blood patient stated she wants IV team called

## 2016-03-23 NOTE — ED Notes (Signed)
Attempted to draw blood on pt and start IV.  Pt was agreeable to IV but when it was time to stick pt, pt would retract from RN.  Procedures was explained to pt multiple times and pt expressed understanding but when it was time to stick pt, pt would pull arm away. Will notified Dr. Jeraldine LootsLockwood.

## 2016-03-23 NOTE — ED Triage Notes (Signed)
Per EMS report: Pt coming from home and lives by herself.  Someone who was on the phone with pt called 911 b/c the pt was "acting funny."  On EMS arrival, pt was observed to be packing her belongings.  Pt is also a diabetic pt with her insulin needles here. Pt reports left elbow pain.

## 2016-03-23 NOTE — Telephone Encounter (Signed)
Patient calls saying that [facility] cannot get all her medicine. She still does not have her insulin or testing supplies and has not had it in a couple of days. She does not feel good, trying to stay awake, she fell and hurt her back, is confused, agrees to call 911 after she gets clothes on, but could not repeat back 911 after 3 tries.  Therefore CDE called 911 for her as I am concerned about her being in DKA. They said an ambulance was on it's way to her residence. Called patient and told her that I had called 911 for her. She verbalized appreciation.
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
## 2016-03-23 NOTE — ED Notes (Signed)
I attempted to collect labs and patient moved her arm unable to collect because of behavior.  I made the nurse and MD aware

## 2016-03-23 NOTE — ED Notes (Signed)
Informed by other staff members that pt has left the department. Pt last seen by this RN to be ambulatory with a steady gait in the room and in no acute distress.

## 2016-03-23 NOTE — ED Notes (Signed)
Pt is hard to re-direct and re-orient.  Pt keeps on looking through her belongings for things and being distracted.
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
## 2016-03-24 ENCOUNTER — Telehealth: Payer: Self-pay | Admitting: Dietician

## 2016-03-24 ENCOUNTER — Encounter (HOSPITAL_COMMUNITY): Payer: Self-pay | Admitting: Emergency Medicine

## 2016-03-24 DIAGNOSIS — E039 Hypothyroidism, unspecified: Secondary | ICD-10-CM | POA: Insufficient documentation

## 2016-03-24 DIAGNOSIS — Z87891 Personal history of nicotine dependence: Secondary | ICD-10-CM | POA: Diagnosis not present

## 2016-03-24 DIAGNOSIS — I5032 Chronic diastolic (congestive) heart failure: Secondary | ICD-10-CM | POA: Insufficient documentation

## 2016-03-24 DIAGNOSIS — I110 Hypertensive heart disease with heart failure: Secondary | ICD-10-CM | POA: Diagnosis not present

## 2016-03-24 DIAGNOSIS — E10649 Type 1 diabetes mellitus with hypoglycemia without coma: Secondary | ICD-10-CM | POA: Insufficient documentation

## 2016-03-24 DIAGNOSIS — Z79899 Other long term (current) drug therapy: Secondary | ICD-10-CM | POA: Diagnosis not present

## 2016-03-24 DIAGNOSIS — Z7982 Long term (current) use of aspirin: Secondary | ICD-10-CM | POA: Insufficient documentation

## 2016-03-24 DIAGNOSIS — I2510 Atherosclerotic heart disease of native coronary artery without angina pectoris: Secondary | ICD-10-CM | POA: Diagnosis not present

## 2016-03-24 LAB — URINE CULTURE: SPECIAL REQUESTS: NORMAL

## 2016-03-24 LAB — CBG MONITORING, ED: GLUCOSE-CAPILLARY: 135 mg/dL — AB (ref 65–99)
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
## 2016-03-24 NOTE — ED Triage Notes (Signed)
Pt to ED via [facility] after reported being found with CBG of 18.  EMS reports pt was given food by the fire dept. Prior to their arrival.

## 2016-03-25 ENCOUNTER — Emergency Department (HOSPITAL_COMMUNITY)
Admission: EM | Admit: 2016-03-25 | Discharge: 2016-03-25 | Disposition: A | Discharge status: Home / Self Care | Payer: Medicaid Other | Attending: Emergency Medicine | Admitting: Emergency Medicine

## 2016-03-25 ENCOUNTER — Emergency Department (HOSPITAL_COMMUNITY): Payer: Medicaid Other

## 2016-03-25 DIAGNOSIS — E162 Hypoglycemia, unspecified: Secondary | ICD-10-CM

## 2016-03-25 DIAGNOSIS — E1065 Type 1 diabetes mellitus with hyperglycemia: Secondary | ICD-10-CM

## 2016-03-25 DIAGNOSIS — IMO0002 Reserved for concepts with insufficient information to code with codable children: Secondary | ICD-10-CM

## 2016-03-25 DIAGNOSIS — E103399 Type 1 diabetes mellitus with moderate nonproliferative diabetic retinopathy without macular edema, unspecified eye: Secondary | ICD-10-CM

## 2016-03-25 LAB — CBG MONITORING, ED
Glucose-Capillary: 191 mg/dL — ABNORMAL HIGH (ref 65–99)
Glucose-Capillary: 252 mg/dL — ABNORMAL HIGH (ref 65–99)

## 2016-03-25 MED ORDER — "INSULIN SYRINGE-NEEDLE U-100 31G X 15/64"" 0.3 ML MISC": 12 refills | Status: DC

## 2016-03-25 MED ORDER — GLUCOSE BLOOD VI STRP: ORAL_STRIP | 12 refills | Status: AC

## 2016-03-25 MED ORDER — ALBUTEROL SULFATE HFA 108 (90 BASE) MCG/ACT IN AERS
2.0000 | INHALATION_SPRAY | Freq: Four times a day (QID) | RESPIRATORY_TRACT | 0 refills | Status: DC | PRN
Start: 2016-03-25 — End: 2016-03-28

## 2016-03-25 MED ORDER — INSULIN GLARGINE 100 UNIT/ML ~~LOC~~ SOLN: SUBCUTANEOUS | 11 refills | Status: DC

## 2016-03-25 NOTE — ED Notes (Signed)
MD at bedside. 
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
## 2016-03-25 NOTE — ED Notes (Signed)
Pt transported to xray 
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
## 2016-03-28 ENCOUNTER — Telehealth: Payer: Self-pay | Admitting: Internal Medicine

## 2016-03-28 ENCOUNTER — Other Ambulatory Visit: Payer: Self-pay | Admitting: Dietician

## 2016-03-28 ENCOUNTER — Ambulatory Visit (INDEPENDENT_AMBULATORY_CARE_PROVIDER_SITE_OTHER): Payer: Medicaid Other | Admitting: Internal Medicine

## 2016-03-28 ENCOUNTER — Encounter: Payer: Self-pay | Admitting: Internal Medicine

## 2016-03-28 ENCOUNTER — Ambulatory Visit: Payer: Medicaid Other | Admitting: Dietician

## 2016-03-28 VITALS — BP 137/68 | HR 96 | Temp 98.3°F | Ht 65.0 in | Wt 124.4 lb

## 2016-03-28 DIAGNOSIS — E103399 Type 1 diabetes mellitus with moderate nonproliferative diabetic retinopathy without macular edema, unspecified eye: Secondary | ICD-10-CM

## 2016-03-28 DIAGNOSIS — Z0000 Encounter for general adult medical examination without abnormal findings: Secondary | ICD-10-CM

## 2016-03-28 DIAGNOSIS — Z23 Encounter for immunization: Secondary | ICD-10-CM

## 2016-03-28 DIAGNOSIS — IMO0002 Reserved for concepts with insufficient information to code with codable children: Secondary | ICD-10-CM

## 2016-03-28 DIAGNOSIS — E1065 Type 1 diabetes mellitus with hyperglycemia: Principal | ICD-10-CM

## 2016-03-28 LAB — GLUCOSE, CAPILLARY: Glucose-Capillary: 418 mg/dL — ABNORMAL HIGH (ref 65–99)

## 2016-03-28 LAB — POCT GLYCOSYLATED HEMOGLOBIN (HGB A1C): Hemoglobin A1C: 8.7

## 2016-03-28 MED ORDER — INSULIN GLARGINE 100 UNIT/ML ~~LOC~~ SOLN: SUBCUTANEOUS | 11 refills | Status: DC

## 2016-03-28 MED ORDER — ALBUTEROL SULFATE HFA 108 (90 BASE) MCG/ACT IN AERS: 2.0000 | INHALATION_SPRAY | Freq: Four times a day (QID) | RESPIRATORY_TRACT | 5 refills | Status: AC | PRN

## 2016-03-28 NOTE — Telephone Encounter (Signed)
I sent a script over

## 2016-03-28 NOTE — Telephone Encounter (Signed)
Called [facility] to see when patient can refill her strips: 03/07/16. They say her strips went through for no charge and she can pick them up
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
## 2016-03-28 NOTE — Telephone Encounter (Signed)
[facility] ask if she can [facility]ill her al[facility]erol. He does not have a current prescription for albuterol. If we send one , he will run it through.

## 2016-03-28 NOTE — Assessment & Plan Note (Addendum)
Very difficult social situation which complicates controlling her already difficult to control diabetes. She is having frequent hypoglycemia but also having significant high blood sugars the rest of the time. Since her hypoglycemia is primarily in the morning, will discontinue her evening Lantus. Will continue Lantus 16 units in AM for now. Since she did not take any Lantus today I advised her to take 10 units when she picks up her insulin this afternoon from the pharmacy. She was provided with a new glucometer and syringes. [name] with SW has consulted [facility] to help patient. Appreciate Donna's continued help with this patient as well. Will have her return in 2 weeks (patient unable to return in 1 week due to costs) to reassess her blood sugars. Patient also given triage number to talk to nursing if blood sugars are at extremes.
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
## 2016-03-28 NOTE — Patient Instructions (Signed)
General Instructions: - Take Lantus 16 units in the morning - Do not take any insulin in the evening - Stay well hydrated - Return to clinic in 1 week for recheck of blood sugars  Please bring your medicines with you each time you come to clinic.  Medicines may include prescription medications, over-the-counter medications, herbal remedies, eye drops, vitamins, or other pills.   Progress Toward Treatment Goals:  Treatment Goal 09/07/2015  Hemoglobin A1C improved  Prevent falls -    Self Care Goals & Plans:  Self Care Goal 01/25/2016  Manage my medications take my medicines as prescribed; bring my medications to every visit; refill my medications on time  Monitor my health keep track of my blood glucose; bring my glucose meter and log to each visit  Eat healthy foods eat more vegetables; eat foods that are low in salt; eat baked foods instead of fried foods  Be physically active -  Prevent falls -  Meeting treatment goals -    Home Blood Glucose Monitoring 09/07/2015  Check my blood sugar 3 times a day  When to check my blood sugar before meals     Care Management & Community Referrals:  Referral 09/07/2015  Referrals made for care management support nutritionist; diabetes educator  Referrals made to community resources -

## 2016-03-28 NOTE — Assessment & Plan Note (Signed)
Flu shot today 

## 2016-03-28 NOTE — Patient Instructions (Signed)
Please call the [facility] at [phone] on weekends and after hours.  We will follow up with [facility] referral.   [facility] as [facility].

## 2016-03-28 NOTE — Telephone Encounter (Signed)
[facility] ask [facility]ill

## 2016-03-30 ENCOUNTER — Telehealth: Payer: Self-pay | Admitting: Dietician

## 2016-03-30 ENCOUNTER — Telehealth: Payer: Self-pay | Admitting: *Deleted

## 2016-03-30 NOTE — Telephone Encounter (Addendum)
Call to patient's friend to see if he is with patient or has seen her today.  Friend stated that he had not seen patient and did not know where she was.  I identified myself as Venita SheffieldGladys 1 of the Nurses from the Clinics.  Call to patient  Who answered the phone stated she could not breath.  Told patient we would like to get her some help and we needed to know where she was to send help.  Patint stated she was on the way to the hospital and the conversation abruptly ended.  No answer when patient's name was called before my hanging up  the phone.  Angelina OkGladys Herbin, RN 9/14/201 5:10 PM

## 2016-03-30 NOTE — Progress Notes (Signed)
Patient ID: Nicole DistanceLenora Langstaff, female   DOB: [birth year], 50 y.o.   MRN: [id number]  Case discussed with Dr. Beckie Saltsivet soon after the resident saw the patient. We reviewed the resident's history and exam and pertinent patient test results. I agree with the assessment, diagnosis, and plan of care documented in the resident's note.
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
## 2016-03-31 NOTE — Telephone Encounter (Signed)
Patient left voicemail asking for return call. Returned call:  blood sugar "went down to 326 at 916 am  Today". Call cut off called back to tell her to call the [facility] Connect Line when she has  questions or concerns. She agreed to do that.

## 2016-04-03 ENCOUNTER — Telehealth: Payer: Self-pay | Admitting: Licensed Clinical Social Worker
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
## 2016-04-04 ENCOUNTER — Telehealth: Payer: Self-pay | Admitting: Dietician

## 2016-04-04 NOTE — Telephone Encounter (Signed)
CSW was instructed by [facility] to contact [facility] at [phone].  Spoke with [facility] staff, pt able to contact Ms. Chilton SiGreen [phone] to schedule an appointment for application assistance. CSW placed call to Ms. Wyly to informed pt of above.  Pt provided with information above.  [name] then states the Emergency Squad pick her up yesterday as she passes out trying to get something to eat.  CSW inquired if pt wanted an appointment to be seen, pt states she has an appointment scheduled in 3 months.  CSW offered if pt wanted an earlier appointment, pt declined but also stated she may come in to be seen. CSW scheduled appointment on behalf of Ms. Horne on Thursday 04/06/16 at 3:00pm with [facility], Ms. Neva SeatGreene, [address], [address], [place] [id number]. [phone].  CSW sent text message to Ms. Brumm with [facility] appointment date/time and address.  Call placed to patient to confirm receipt.  Pt states she go the message.  CSW will sign off but remain available as needed.

## 2016-04-04 NOTE — Telephone Encounter (Signed)
Okay, I will. Thank you.

## 2016-04-04 NOTE — Telephone Encounter (Signed)
[facility] has no vacancies at this time.  CSW sent inquiry to [facility] on best way for patient to apply or if pt can have representative apply on her behalf.
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
## 2016-04-06 ENCOUNTER — Emergency Department (HOSPITAL_COMMUNITY): Payer: Medicaid Other

## 2016-04-06 ENCOUNTER — Emergency Department (HOSPITAL_COMMUNITY)
Admission: EM | Admit: 2016-04-06 | Discharge: 2016-04-07 | Disposition: A | Discharge status: Home / Self Care | Payer: Medicaid Other | Attending: Emergency Medicine | Admitting: Emergency Medicine

## 2016-04-06 DIAGNOSIS — I2510 Atherosclerotic heart disease of native coronary artery without angina pectoris: Secondary | ICD-10-CM | POA: Diagnosis not present

## 2016-04-06 DIAGNOSIS — Z87891 Personal history of nicotine dependence: Secondary | ICD-10-CM | POA: Insufficient documentation

## 2016-04-06 DIAGNOSIS — E162 Hypoglycemia, unspecified: Secondary | ICD-10-CM

## 2016-04-06 DIAGNOSIS — Z8639 Personal history of other endocrine, nutritional and metabolic disease: Secondary | ICD-10-CM | POA: Insufficient documentation

## 2016-04-06 DIAGNOSIS — Z79899 Other long term (current) drug therapy: Secondary | ICD-10-CM | POA: Insufficient documentation

## 2016-04-06 DIAGNOSIS — I10 Essential (primary) hypertension: Secondary | ICD-10-CM | POA: Insufficient documentation

## 2016-04-06 DIAGNOSIS — Z794 Long term (current) use of insulin: Secondary | ICD-10-CM | POA: Diagnosis not present

## 2016-04-06 DIAGNOSIS — Z7951 Long term (current) use of inhaled steroids: Secondary | ICD-10-CM | POA: Insufficient documentation

## 2016-04-06 DIAGNOSIS — E10649 Type 1 diabetes mellitus with hypoglycemia without coma: Secondary | ICD-10-CM | POA: Diagnosis not present

## 2016-04-06 DIAGNOSIS — Z7982 Long term (current) use of aspirin: Secondary | ICD-10-CM | POA: Insufficient documentation

## 2016-04-06 LAB — CBG MONITORING, ED: GLUCOSE-CAPILLARY: 168 mg/dL — AB (ref 65–99)
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
## 2016-04-06 NOTE — ED Notes (Signed)
Per EMS- hypo glycemia. Cbg of 35. cbg of 50 after crackers and juice 15 minutes ago. Pt now not awake.

## 2016-04-06 NOTE — ED Notes (Signed)
Bed: [id number] Expected date:  Expected time:  Means of arrival:  Comments: 50 yr old, fall hip pain

## 2016-04-07 LAB — CBC WITH DIFFERENTIAL/PLATELET
BASOS ABS: 0 10*3/uL (ref 0.0–0.1)
Basophils Relative: 0 %
EOS ABS: 0 10*3/uL (ref 0.0–0.7)
Eosinophils Relative: 1 %
HCT: 32.3 % — ABNORMAL LOW (ref 36.0–46.0)
HEMOGLOBIN: 10.3 g/dL — AB (ref 12.0–15.0)
LYMPHS ABS: 1.6 10*3/uL (ref 0.7–4.0)
LYMPHS PCT: 36 %
MCH: 20.1 pg — ABNORMAL LOW (ref 26.0–34.0)
MCHC: 31.9 g/dL (ref 30.0–36.0)
MCV: 63 fL — ABNORMAL LOW (ref 78.0–100.0)
Monocytes Absolute: 0.3 10*3/uL (ref 0.1–1.0)
Monocytes Relative: 6 %
NEUTROS ABS: 2.5 10*3/uL (ref 1.7–7.7)
Neutrophils Relative %: 57 %
PLATELETS: 170 10*3/uL (ref 150–400)
RBC: 5.13 MIL/uL — ABNORMAL HIGH (ref 3.87–5.11)
RDW: 15.9 % — AB (ref 11.5–15.5)
WBC: 4.4 10*3/uL (ref 4.0–10.5)

## 2016-04-07 LAB — URINALYSIS, ROUTINE W REFLEX MICROSCOPIC
BILIRUBIN URINE: NEGATIVE
Glucose, UA: >1000 mg/dL — AB
Hgb urine dipstick: NEGATIVE
KETONES UR: NEGATIVE mg/dL
Leukocytes, UA: NEGATIVE
NITRITE: NEGATIVE
PROTEIN: 30 mg/dL — AB
SPECIFIC GRAVITY, URINE: 1.037 — AB (ref 1.005–1.030)
pH: 5 (ref 5.0–8.0)

## 2016-04-07 LAB — COMPREHENSIVE METABOLIC PANEL
ALT: 16 U/L (ref 14–54)
AST: 26 U/L (ref 15–41)
Albumin: 3.5 g/dL (ref 3.5–5.0)
Alkaline Phosphatase: 44 U/L (ref 38–126)
Anion gap: 10 (ref 5–15)
BILIRUBIN TOTAL: 0.2 mg/dL — AB (ref 0.3–1.2)
BUN: 17 mg/dL (ref 6–20)
CO2: 22 mmol/L (ref 22–32)
Calcium: 8.5 mg/dL — ABNORMAL LOW (ref 8.9–10.3)
Chloride: 104 mmol/L (ref 101–111)
Creatinine, Ser: 0.8 mg/dL (ref 0.44–1.00)
GFR calc Af Amer: >60 mL/min (ref >60)
Glucose, Bld: 389 mg/dL — ABNORMAL HIGH (ref 65–99)
POTASSIUM: 3.3 mmol/L — AB (ref 3.5–5.1)
Sodium: 136 mmol/L (ref 135–145)
TOTAL PROTEIN: 7 g/dL (ref 6.5–8.1)

## 2016-04-07 LAB — ETHANOL

## 2016-04-07 LAB — CBG MONITORING, ED
GLUCOSE-CAPILLARY: 361 mg/dL — AB (ref 65–99)
GLUCOSE-CAPILLARY: 205 mg/dL — AB (ref 65–99)
Glucose-Capillary: 370 mg/dL — ABNORMAL HIGH (ref 65–99)

## 2016-04-07 LAB — URINE MICROSCOPIC-ADD ON: RBC / HPF: NONE SEEN RBC/hpf (ref 0–5)

## 2016-04-07 MED ORDER — POTASSIUM CHLORIDE CRYS ER 20 MEQ PO TBCR
40.0000 meq | EXTENDED_RELEASE_TABLET | Freq: Once | ORAL | Status: AC
  Administered 2016-04-07: 40 meq via ORAL
  Filled 2016-04-07: qty 2

## 2016-04-07 NOTE — ED Notes (Signed)
Pt refused for blood draws and keeps jerking her hand with Clinical research associatewriter. RN and EDP Criss AlvineGoldston notified

## 2016-04-07 NOTE — ED Notes (Signed)
Pt is very aggressive when writer tried to draw blood work, pt jerking and scratching at staff.

## 2016-04-07 NOTE — ED Notes (Signed)
Pt needed a bus pass, but we don't have any. Pt will wait in lobby until they can get a bus pass.

## 2016-04-07 NOTE — ED Notes (Signed)
Patient will not stay in bed and keep her EKG leads on.

## 2016-04-12 ENCOUNTER — Telehealth: Payer: Self-pay | Admitting: Dietician

## 2016-04-12 NOTE — Telephone Encounter (Signed)
[name] called to says she had a low blood sugar. She confirmed that she is taking 16 units lantus once daily and not taking an evening shot. She also wanted to find out when her appointment is. She did not have one- she was scheduled for this Friday at 1:45 PM per Dr. Larena Soxivet's note for her to return in 2 weeks. She verbalized understanding.

## 2016-04-14 ENCOUNTER — Ambulatory Visit: Payer: Medicaid Other

## 2016-04-14 ENCOUNTER — Telehealth: Payer: Self-pay | Admitting: Dietician
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
## 2016-04-17 ENCOUNTER — Telehealth: Payer: Self-pay | Admitting: Dietician

## 2016-04-17 NOTE — Telephone Encounter (Signed)
Needs help with transportation. Has been paying 150 when she is supposed to pay 75 cents for the city bus.   Scat bus- thinks she is supposed to ride free. They won't let her ride.  Told her she would need to speak with our socail worker because I was not familiar with the rules for transportation.

## 2016-04-18 ENCOUNTER — Telehealth: Payer: Self-pay | Admitting: Dietician

## 2016-04-18 ENCOUNTER — Emergency Department (HOSPITAL_COMMUNITY)
Admission: EM | Admit: 2016-04-18 | Discharge: 2016-04-18 | Disposition: A | Discharge status: Home / Self Care | Payer: Medicaid Other | Attending: Emergency Medicine | Admitting: Emergency Medicine

## 2016-04-18 ENCOUNTER — Encounter (HOSPITAL_COMMUNITY): Payer: Self-pay

## 2016-04-18 DIAGNOSIS — Z87891 Personal history of nicotine dependence: Secondary | ICD-10-CM | POA: Diagnosis not present

## 2016-04-18 DIAGNOSIS — Z79899 Other long term (current) drug therapy: Secondary | ICD-10-CM | POA: Insufficient documentation

## 2016-04-18 DIAGNOSIS — E039 Hypothyroidism, unspecified: Secondary | ICD-10-CM | POA: Diagnosis not present

## 2016-04-18 DIAGNOSIS — I2510 Atherosclerotic heart disease of native coronary artery without angina pectoris: Secondary | ICD-10-CM | POA: Insufficient documentation

## 2016-04-18 DIAGNOSIS — R739 Hyperglycemia, unspecified: Secondary | ICD-10-CM

## 2016-04-18 DIAGNOSIS — Z7982 Long term (current) use of aspirin: Secondary | ICD-10-CM | POA: Diagnosis not present

## 2016-04-18 DIAGNOSIS — IMO0002 Reserved for concepts with insufficient information to code with codable children: Secondary | ICD-10-CM

## 2016-04-18 DIAGNOSIS — E1065 Type 1 diabetes mellitus with hyperglycemia: Secondary | ICD-10-CM | POA: Diagnosis not present

## 2016-04-18 DIAGNOSIS — I10 Essential (primary) hypertension: Secondary | ICD-10-CM | POA: Diagnosis not present

## 2016-04-18 DIAGNOSIS — Z955 Presence of coronary angioplasty implant and graft: Secondary | ICD-10-CM | POA: Diagnosis not present

## 2016-04-18 DIAGNOSIS — Z9114 Patient's other noncompliance with medication regimen: Secondary | ICD-10-CM | POA: Diagnosis not present

## 2016-04-18 DIAGNOSIS — E103399 Type 1 diabetes mellitus with moderate nonproliferative diabetic retinopathy without macular edema, unspecified eye: Secondary | ICD-10-CM

## 2016-04-18 LAB — CBC WITH DIFFERENTIAL/PLATELET
BASOS PCT: 1 %
Basophils Absolute: 0.1 10*3/uL (ref 0.0–0.1)
EOS PCT: 0 %
Eosinophils Absolute: 0 10*3/uL (ref 0.0–0.7)
HEMATOCRIT: 41 % (ref 36.0–46.0)
Hemoglobin: 12.4 g/dL (ref 12.0–15.0)
LYMPHS ABS: 1.9 10*3/uL (ref 0.7–4.0)
LYMPHS PCT: 34 %
MCH: 19.8 pg — ABNORMAL LOW (ref 26.0–34.0)
MCHC: 30.2 g/dL (ref 30.0–36.0)
MCV: 65.5 fL — AB (ref 78.0–100.0)
MONOS PCT: 9 %
Monocytes Absolute: 0.5 10*3/uL (ref 0.1–1.0)
NEUTROS ABS: 3 10*3/uL (ref 1.7–7.7)
NEUTROS PCT: 56 %
Platelets: 238 10*3/uL (ref 150–400)
RBC: 6.26 MIL/uL — AB (ref 3.87–5.11)
RDW: 15.9 % — AB (ref 11.5–15.5)
WBC: 5.5 10*3/uL (ref 4.0–10.5)

## 2016-04-18 LAB — CBG MONITORING, ED
GLUCOSE-CAPILLARY: 239 mg/dL — AB (ref 65–99)
GLUCOSE-CAPILLARY: 116 mg/dL — AB (ref 65–99)
Glucose-Capillary: 477 mg/dL — ABNORMAL HIGH (ref 65–99)
Glucose-Capillary: 74 mg/dL (ref 65–99)

## 2016-04-18 LAB — ETHANOL

## 2016-04-18 LAB — URINALYSIS, ROUTINE W REFLEX MICROSCOPIC
Bilirubin Urine: NEGATIVE
Hgb urine dipstick: NEGATIVE
KETONES UR: 15 mg/dL — AB
LEUKOCYTES UA: NEGATIVE
Nitrite: NEGATIVE
PH: 5.5 (ref 5.0–8.0)
Protein, ur: NEGATIVE mg/dL
Specific Gravity, Urine: 1.045 — ABNORMAL HIGH (ref 1.005–1.030)

## 2016-04-18 LAB — URINE MICROSCOPIC-ADD ON

## 2016-04-18 LAB — RAPID URINE DRUG SCREEN, HOSP PERFORMED
AMPHETAMINES: NOT DETECTED
BARBITURATES: NOT DETECTED
BENZODIAZEPINES: NOT DETECTED
Cocaine: NOT DETECTED
Opiates: NOT DETECTED
Tetrahydrocannabinol: NOT DETECTED

## 2016-04-18 LAB — HEPATIC FUNCTION PANEL
ALT: 23 U/L (ref 14–54)
AST: 39 U/L (ref 15–41)
Albumin: 4.3 g/dL (ref 3.5–5.0)
Alkaline Phosphatase: 59 U/L (ref 38–126)
BILIRUBIN INDIRECT: 0 mg/dL — AB (ref 0.3–0.9)
Bilirubin, Direct: 0.2 mg/dL (ref 0.1–0.5)
TOTAL PROTEIN: 8.8 g/dL — AB (ref 6.5–8.1)
Total Bilirubin: 0.2 mg/dL — ABNORMAL LOW (ref 0.3–1.2)

## 2016-04-18 LAB — BASIC METABOLIC PANEL
ANION GAP: 12 (ref 5–15)
BUN: 11 mg/dL (ref 6–20)
CHLORIDE: 101 mmol/L (ref 101–111)
CO2: 24 mmol/L (ref 22–32)
Calcium: 9.7 mg/dL (ref 8.9–10.3)
Creatinine, Ser: 1.01 mg/dL — ABNORMAL HIGH (ref 0.44–1.00)
GFR calc non Af Amer: >60 mL/min (ref >60)
Glucose, Bld: 316 mg/dL — ABNORMAL HIGH (ref 65–99)
POTASSIUM: 3.6 mmol/L (ref 3.5–5.1)
SODIUM: 137 mmol/L (ref 135–145)

## 2016-04-18 LAB — I-STAT VENOUS BLOOD GAS, ED
Acid-Base Excess: 2 mmol/L (ref 0.0–2.0)
BICARBONATE: 23.8 mmol/L (ref 20.0–28.0)
O2 SAT: 66 %
PCO2 VEN: 28 mmHg — AB (ref 44.0–60.0)
PO2 VEN: 30 mmHg — AB (ref 32.0–45.0)
TCO2: 25 mmol/L (ref 0–100)
pH, Ven: 7.537 — ABNORMAL HIGH (ref 7.250–7.430)

## 2016-04-18 LAB — LIPASE, BLOOD: LIPASE: 18 U/L (ref 11–51)

## 2016-04-18 LAB — I-STAT CG4 LACTIC ACID, ED: LACTIC ACID, VENOUS: 2.79 mmol/L — AB (ref 0.5–1.9)

## 2016-04-18 MED ORDER — ACCU-CHEK FASTCLIX LANCETS MISC: 12 refills | Status: DC

## 2016-04-18 MED ORDER — INSULIN GLARGINE 100 UNIT/ML ~~LOC~~ SOLN: SUBCUTANEOUS | 11 refills | Status: AC

## 2016-04-18 MED ORDER — "INSULIN SYRINGE-NEEDLE U-100 31G X 15/64"" 0.3 ML MISC": 0 refills | Status: AC

## 2016-04-18 MED ORDER — ZIPRASIDONE HCL 20 MG PO CAPS
20.0000 mg | ORAL_CAPSULE | Freq: Once | ORAL | Status: DC
  Filled 2016-04-18: qty 1

## 2016-04-18 MED ORDER — SODIUM CHLORIDE 0.9 % IV BOLUS (SEPSIS): 1000.0000 mL | Freq: Once | INTRAVENOUS | Status: DC

## 2016-04-18 MED ORDER — ACCU-CHEK AVIVA PLUS W/DEVICE KIT: PACK | 0 refills | Status: AC

## 2016-04-18 NOTE — ED Notes (Addendum)
Pt. Refusing to sit still for an IV. EDP made aware.

## 2016-04-18 NOTE — ED Notes (Signed)
Checked patient blood sugar it was 239 notified RN Erin of blood sugar also got patient a phone to call home

## 2016-04-18 NOTE — ED Notes (Addendum)
Pt. Given orange juice and encouraged to eat her sandwich. Carb modified tray ordered.

## 2016-04-18 NOTE — ED Notes (Signed)
Pt. Wheeled to the restroom at this time.
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
## 2016-04-18 NOTE — ED Provider Notes (Signed)
Pt's blood sugar is now stable.  She has eaten and looks good.  The pt will be given a rx for her blood sugar monitor and for her normal insulin.  Pt instructed to f/u with the IM clinic and to return if worse.   Jacalyn LefevreJulie Doninique Lwin, MD 04/18/16 [phone]

## 2016-04-18 NOTE — ED Notes (Signed)
Pt. Given sandwich and water with EDP permission.

## 2016-04-18 NOTE — Telephone Encounter (Signed)
[name] called from the emergency room asking for insulin and a meter. I told her with we have an accuchek guide meter and that a physician or pharmacist would need to sign out insulin. I also asked since she was calling so often and seemed to be having difficulties if she might consider assisted living. She got angry saying she was doing the best she could and hung up.
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
## 2016-04-18 NOTE — ED Notes (Signed)
Provided pt with dinner tray and bus pass

## 2016-04-18 NOTE — ED Notes (Signed)
CBG 116 Rn notified
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
## 2016-04-18 NOTE — ED Notes (Signed)
Pt refused blood draw,  I asked several times and she stated "NO".   Pt also stated that she didn't have any blood so I advised her I will let the nurse know.

## 2016-04-18 NOTE — ED Notes (Signed)
RN walked into patients room to find her on the edge of the bed actively injecting herself with medication pen. Pt. States she took 16 units of insulin. Pt. Allowed tech to retrieve medication pen. Pen is for novolog insulin. Pt. adamant she took 16 units of insulin. EDP made aware and is at bedside.

## 2016-04-18 NOTE — ED Notes (Signed)
Pt refusing to go, security at bedside
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
## 2016-04-18 NOTE — ED Notes (Signed)
Pt. Ripping off all leads, BP cuff, and pulse oximetry.

## 2016-04-18 NOTE — ED Notes (Signed)
Pt wheeled to the bathroom in a wheelchair.  I assisted her from the wheelchair to the toilet.  She required physical assistance as she became very wobbly without assistance.  Urine sample collected.

## 2016-04-19 ENCOUNTER — Observation Stay (HOSPITAL_COMMUNITY)
Admission: EM | Admit: 2016-04-19 | Discharge: 2016-04-19 | Discharge status: Against Medical Advice | Payer: Medicaid Other | Attending: Internal Medicine | Admitting: Internal Medicine

## 2016-04-19 ENCOUNTER — Encounter (HOSPITAL_COMMUNITY): Payer: Self-pay | Admitting: Emergency Medicine

## 2016-04-19 DIAGNOSIS — I10 Essential (primary) hypertension: Secondary | ICD-10-CM | POA: Insufficient documentation

## 2016-04-19 DIAGNOSIS — Z87891 Personal history of nicotine dependence: Secondary | ICD-10-CM | POA: Diagnosis not present

## 2016-04-19 DIAGNOSIS — Z794 Long term (current) use of insulin: Secondary | ICD-10-CM | POA: Diagnosis not present

## 2016-04-19 DIAGNOSIS — I2510 Atherosclerotic heart disease of native coronary artery without angina pectoris: Secondary | ICD-10-CM | POA: Diagnosis not present

## 2016-04-19 DIAGNOSIS — E1010 Type 1 diabetes mellitus with ketoacidosis without coma: Principal | ICD-10-CM | POA: Insufficient documentation

## 2016-04-19 DIAGNOSIS — Z5321 Procedure and treatment not carried out due to patient leaving prior to being seen by health care provider: Secondary | ICD-10-CM

## 2016-04-19 DIAGNOSIS — F79 Unspecified intellectual disabilities: Secondary | ICD-10-CM | POA: Insufficient documentation

## 2016-04-19 DIAGNOSIS — F1911 Other psychoactive substance abuse, in remission: Secondary | ICD-10-CM

## 2016-04-19 DIAGNOSIS — E1065 Type 1 diabetes mellitus with hyperglycemia: Secondary | ICD-10-CM | POA: Diagnosis present

## 2016-04-19 DIAGNOSIS — Z7982 Long term (current) use of aspirin: Secondary | ICD-10-CM | POA: Insufficient documentation

## 2016-04-19 DIAGNOSIS — E039 Hypothyroidism, unspecified: Secondary | ICD-10-CM | POA: Diagnosis not present

## 2016-04-19 DIAGNOSIS — IMO0002 Reserved for concepts with insufficient information to code with codable children: Secondary | ICD-10-CM | POA: Diagnosis present

## 2016-04-19 DIAGNOSIS — R739 Hyperglycemia, unspecified: Secondary | ICD-10-CM | POA: Diagnosis not present

## 2016-04-19 DIAGNOSIS — E1069 Type 1 diabetes mellitus with other specified complication: Secondary | ICD-10-CM | POA: Diagnosis present

## 2016-04-19 DIAGNOSIS — E785 Hyperlipidemia, unspecified: Secondary | ICD-10-CM | POA: Insufficient documentation

## 2016-04-19 DIAGNOSIS — R4689 Other symptoms and signs involving appearance and behavior: Secondary | ICD-10-CM | POA: Diagnosis not present

## 2016-04-19 LAB — CBC WITH DIFFERENTIAL/PLATELET
BASOS PCT: 0 %
Basophils Absolute: 0 10*3/uL (ref 0.0–0.1)
EOS ABS: 0 10*3/uL (ref 0.0–0.7)
EOS PCT: 0 %
HCT: 37.3 % (ref 36.0–46.0)
HEMOGLOBIN: 11.8 g/dL — AB (ref 12.0–15.0)
LYMPHS PCT: 32 %
Lymphs Abs: 1.5 10*3/uL (ref 0.7–4.0)
MCH: 20.4 pg — AB (ref 26.0–34.0)
MCHC: 31.6 g/dL (ref 30.0–36.0)
MCV: 64.4 fL — AB (ref 78.0–100.0)
MONO ABS: 0.3 10*3/uL (ref 0.1–1.0)
Monocytes Relative: 7 %
NEUTROS PCT: 61 %
Neutro Abs: 2.9 10*3/uL (ref 1.7–7.7)
PLATELETS: 167 10*3/uL (ref 150–400)
RBC: 5.79 MIL/uL — AB (ref 3.87–5.11)
RDW: 16 % — ABNORMAL HIGH (ref 11.5–15.5)
WBC: 4.7 10*3/uL (ref 4.0–10.5)

## 2016-04-19 LAB — I-STAT VENOUS BLOOD GAS, ED
BICARBONATE: 25.2 mmol/L (ref 20.0–28.0)
O2 Saturation: 62 %
PH VEN: 7.384 (ref 7.250–7.430)
PO2 VEN: 33 mmHg (ref 32.0–45.0)
TCO2: 26 mmol/L (ref 0–100)
pCO2, Ven: 42.1 mmHg — ABNORMAL LOW (ref 44.0–60.0)

## 2016-04-19 LAB — COMPREHENSIVE METABOLIC PANEL
ALK PHOS: 63 U/L (ref 38–126)
ALT: 25 U/L (ref 14–54)
AST: 33 U/L (ref 15–41)
Albumin: 4.1 g/dL (ref 3.5–5.0)
Anion gap: 15 (ref 5–15)
BUN: 19 mg/dL (ref 6–20)
CALCIUM: 9.6 mg/dL (ref 8.9–10.3)
CHLORIDE: 93 mmol/L — AB (ref 101–111)
CO2: 22 mmol/L (ref 22–32)
CREATININE: 1.34 mg/dL — AB (ref 0.44–1.00)
GFR calc Af Amer: 52 mL/min — ABNORMAL LOW (ref >60)
GFR calc non Af Amer: 45 mL/min — ABNORMAL LOW (ref >60)
Glucose, Bld: 746 mg/dL (ref 65–99)
Potassium: 4.5 mmol/L (ref 3.5–5.1)
SODIUM: 130 mmol/L — AB (ref 135–145)
Total Bilirubin: 1 mg/dL (ref 0.3–1.2)
Total Protein: 8 g/dL (ref 6.5–8.1)

## 2016-04-19 LAB — I-STAT CHEM 8, ED
BUN: 22 mg/dL — AB (ref 6–20)
CHLORIDE: 95 mmol/L — AB (ref 101–111)
Calcium, Ion: 1.07 mmol/L — ABNORMAL LOW (ref 1.15–1.40)
Creatinine, Ser: 1 mg/dL (ref 0.44–1.00)
Glucose, Bld: >700 mg/dL (ref 65–99)
HEMATOCRIT: 44 % (ref 36.0–46.0)
Hemoglobin: 15 g/dL (ref 12.0–15.0)
Potassium: 4.4 mmol/L (ref 3.5–5.1)
Sodium: 132 mmol/L — ABNORMAL LOW (ref 135–145)
TCO2: 24 mmol/L (ref 0–100)

## 2016-04-19 LAB — URINALYSIS, ROUTINE W REFLEX MICROSCOPIC
BILIRUBIN URINE: NEGATIVE
HGB URINE DIPSTICK: NEGATIVE
Ketones, ur: 15 mg/dL — AB
Leukocytes, UA: NEGATIVE
Nitrite: NEGATIVE
PH: 5.5 (ref 5.0–8.0)
Protein, ur: NEGATIVE mg/dL

## 2016-04-19 LAB — I-STAT TROPONIN, ED: Troponin i, poc: 0.03 ng/mL (ref 0.00–0.08)

## 2016-04-19 LAB — RAPID URINE DRUG SCREEN, HOSP PERFORMED
Amphetamines: NOT DETECTED
Barbiturates: NOT DETECTED
Benzodiazepines: NOT DETECTED
COCAINE: NOT DETECTED
OPIATES: NOT DETECTED
TETRAHYDROCANNABINOL: NOT DETECTED

## 2016-04-19 LAB — URINE MICROSCOPIC-ADD ON

## 2016-04-19 LAB — ETHANOL: Alcohol, Ethyl (B): <5 mg/dL (ref <5)

## 2016-04-19 MED ORDER — INSULIN ASPART 100 UNIT/ML ~~LOC~~ SOLN: 10.0000 [IU] | Freq: Once | SUBCUTANEOUS | Status: DC

## 2016-04-19 MED ORDER — SODIUM CHLORIDE 0.9 % IV BOLUS (SEPSIS): 2000.0000 mL | Freq: Once | INTRAVENOUS | Status: DC

## 2016-04-19 MED ORDER — INSULIN ASPART 100 UNIT/ML ~~LOC~~ SOLN: 8.0000 [IU] | Freq: Once | SUBCUTANEOUS | Status: DC

## 2016-04-19 MED ORDER — LORAZEPAM 1 MG PO TABS: 1.0000 mg | ORAL_TABLET | Freq: Once | ORAL | Status: DC

## 2016-04-19 MED ORDER — ZIPRASIDONE MESYLATE 20 MG IM SOLR
20.0000 mg | Freq: Once | INTRAMUSCULAR | Status: DC
Start: 2016-04-19 — End: 2016-04-19

## 2016-04-19 MED ORDER — INSULIN ASPART 100 UNIT/ML ~~LOC~~ SOLN: 2.0000 [IU] | Freq: Once | SUBCUTANEOUS | Status: DC

## 2016-04-19 MED ORDER — LORAZEPAM 2 MG/ML IJ SOLN: 2.0000 mg | Freq: Once | INTRAMUSCULAR | Status: DC

## 2016-04-19 MED ORDER — LORAZEPAM 2 MG/ML IJ SOLN: 1.0000 mg | Freq: Once | INTRAMUSCULAR | Status: DC

## 2016-04-19 NOTE — ED Triage Notes (Addendum)
To ED via [facility] -- for high blood sugar--- was just here in the ED-- CBG was hi on EMS monitor  Pt was combative for EMS -- received Haldol 5mg  IM enroute

## 2016-04-19 NOTE — ED Notes (Signed)
This RN spoke with patient regarding her letting us start an IV, patient stated "I dont have any veins." this RN advised patient that we could find a vein to stick if she would allow us, patient stated "No, I dont want an IV." pt made aware that ED physician would like her to stay overnight to treat her very high blood sugar since it is dangerous to her heatlh, pt stating that she is not going to stay due to having an eye doctors appt in the morning that she cannot cancel again. This RN continued to utilize therapeutic communication in order to attempt to see if patient would allow staff to provide her with needed treatment, however, patient continued to refuse treatment from this RN.

## 2016-04-19 NOTE — ED Notes (Signed)
Charge RN Asher MuirJamie aware that patient eloped out of ED
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
## 2016-04-19 NOTE — ED Provider Notes (Signed)
Medical screening examination/treatment/procedure(s) were conducted as a shared visit with non-physician practitioner(s) and myself.  I personally evaluated the patient during the encounter.   EKG Interpretation None      50yF with hyperglycemia. Recently seen for the same. She is very difficult to deal with. She is irrational and refusing the care she needs. She has not exhibited to me that she has appropriate decision making capability. She had an IV then subsequently asked the IV team to take it out. I discussed with her the importance of testing/treatment/needing IV access. She doesn't seem to grasp the potential seriousness. She insists she just needs subcutaneous insulin ("25 units" "No, 100 units" "ok give me 40 then") and then can go home.   We'll give her a tiny dose to appease her because that the only she would consent to let us put in another IV. Will give her additional sedation as she is very labile and I suspect she will change her mind again. She will ultimately need psychiatric consultation and possible social work involvement once medically cleared.    Raeford RazorStephen Maricela Kawahara, MD 04/19/16 [phone]

## 2016-04-19 NOTE — ED Notes (Signed)
Patient refuses to be put back on room monitor. Patient states, "I am leaving."
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
## 2016-04-19 NOTE — ED Notes (Signed)
Dr. Kohut at bedside 

## 2016-04-19 NOTE — ED Notes (Signed)
PA Pisciotta made aware that patient is refusing to stay overnight for hospitalization.

## 2016-04-19 NOTE — ED Notes (Signed)
IV team at bedside 

## 2016-04-19 NOTE — ED Notes (Signed)
Upon staff entering waiting room, pt unable to be located

## 2016-04-19 NOTE — ED Notes (Signed)
Patient spotted in waiting room at [employer], security out to waiting room to attempt to convince patient to come back into ED

## 2016-04-19 NOTE — ED Notes (Signed)
Iv team reports patient refused to keep IV that she started in her hand, PA Pisciotta at bedside

## 2016-04-19 NOTE — ED Notes (Signed)
Pt left ED. Attempted to locate pt, unable to find pt. [name] aware and will attempt to contact pt via cell phone.

## 2016-04-19 NOTE — ED Notes (Signed)
Admitting team at bedside.

## 2016-04-19 NOTE — ED Notes (Signed)
Patient also refusing for staff to place any monitoring equipment on her at this time

## 2016-04-24 ENCOUNTER — Telehealth: Payer: Self-pay | Admitting: Dietician

## 2016-04-24 NOTE — Telephone Encounter (Signed)
Nicole PontoLenora called to say that she is on her way to pick up a meter to check her blood sugar. She said she did get some strips that she would bring with her.

## 2016-04-24 NOTE — Telephone Encounter (Signed)
[name] picked up an [employer] meter. She had 150 [employer] che[employer] aviva plus test strips that will fit this meter. CDE informed her that if this happens again another option would be to call [employer] and ask them if they can replace her meter. She verbalized understanding.

## 2016-05-17 DEATH — deceased

## 2016-06-13 ENCOUNTER — Encounter: Payer: Medicaid Other | Admitting: Internal Medicine

## 2016-06-16 ENCOUNTER — Telehealth: Payer: Self-pay | Admitting: *Deleted

## 2016-06-16 NOTE — Telephone Encounter (Signed)
Pt's spouse calls and states he just got out of jail and wants to know why his wife died, he has now hung up
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
## 2016-06-16 NOTE — Telephone Encounter (Signed)
Per dr Oswaldo Donevincent, spouse needs to be referred to [place] medical examiner

## 2017-06-09 IMAGING — CR DG CHEST 2V
2 series · 2 of 2 positions shown · non-contrast
Comparison: Chest radiograph performed 03/23/2016

CLINICAL DATA: Acute onset of hypoglycemia. Dizziness, confusion
and shortness of breath.

EXAM:
CHEST  2 VIEW

[w chest lat]
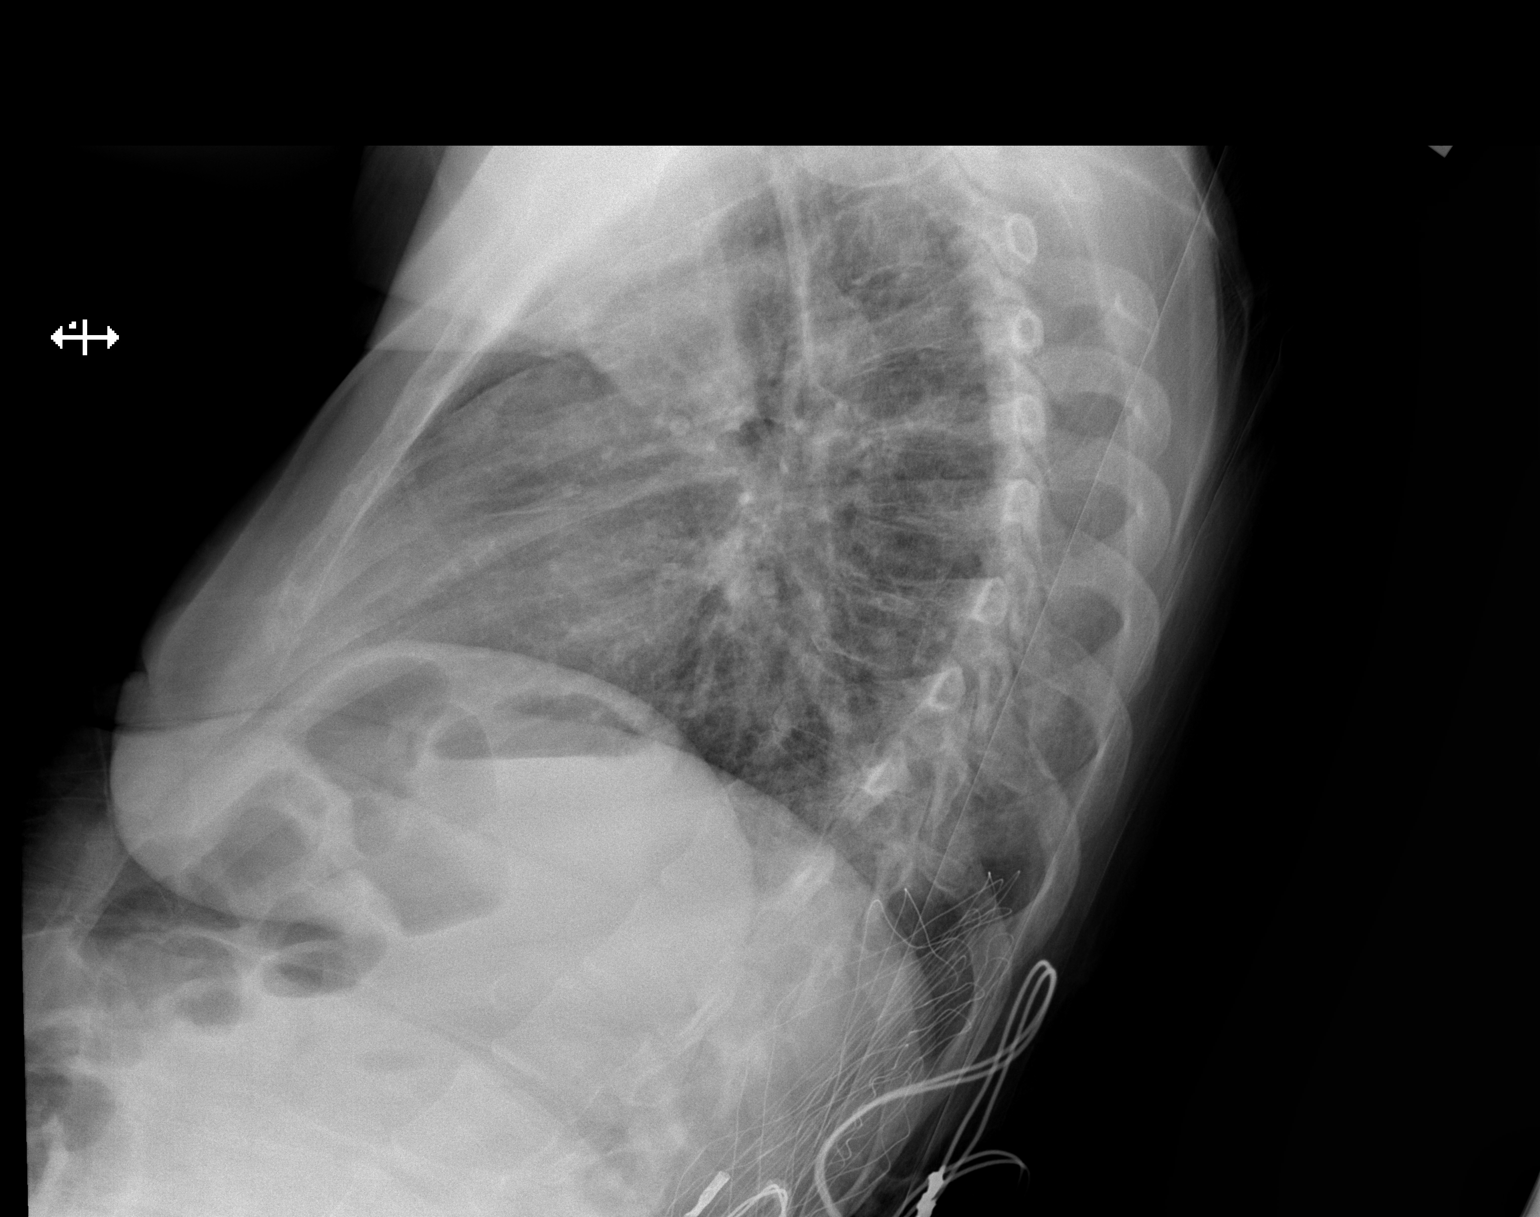

[x chest ap]
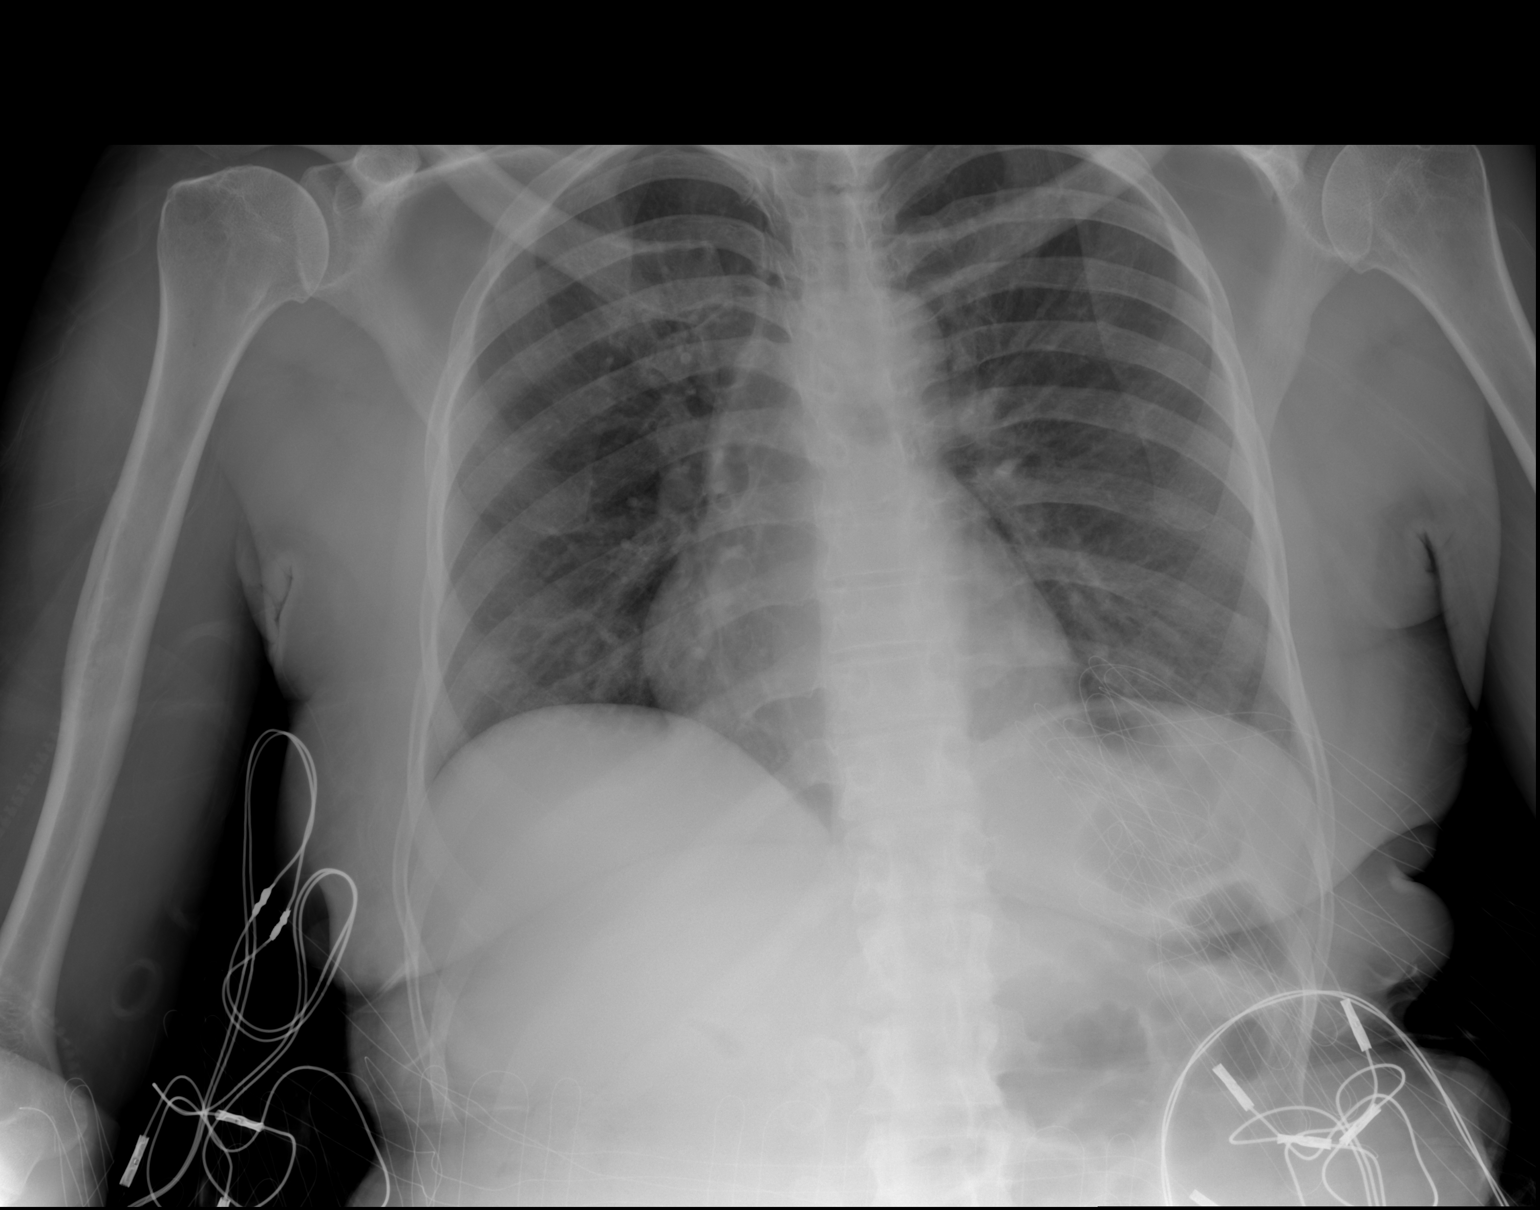

[2 of 2 positions shown; findings below may reference images not displayed]

FINDINGS: The lungs are well-aerated. Mild vascular congestion is suggested.
Right suprahilar opacity may reflect atelectasis or possibly mild
infection. There is no evidence of pleural effusion or pneumothorax.

The heart is borderline normal in size. No acute osseous
abnormalities are seen.
IMPRESSION: Mild vascular congestion suggested. Right suprahilar opacity may
reflect atelectasis or possibly mild infection.
